# Patient Record
Sex: Male | Born: 1940 | Race: White | Hispanic: No | Marital: Married | Smoking: Never smoker
Health system: Southern US, Community
[De-identification: ages and names within clinical notes are randomized; demographics above are authoritative.]

## PROBLEM LIST (undated history)

## (undated) DIAGNOSIS — E785 Hyperlipidemia, unspecified: Secondary | ICD-10-CM

## (undated) DIAGNOSIS — Z860100 Personal history of colon polyps, unspecified: Secondary | ICD-10-CM

## (undated) DIAGNOSIS — I724 Aneurysm of artery of lower extremity: Secondary | ICD-10-CM

## (undated) DIAGNOSIS — M2540 Effusion, unspecified joint: Secondary | ICD-10-CM

## (undated) DIAGNOSIS — R091 Pleurisy: Secondary | ICD-10-CM

## (undated) DIAGNOSIS — G4700 Insomnia, unspecified: Secondary | ICD-10-CM

## (undated) DIAGNOSIS — G4730 Sleep apnea, unspecified: Secondary | ICD-10-CM

## (undated) DIAGNOSIS — D689 Coagulation defect, unspecified: Secondary | ICD-10-CM

## (undated) DIAGNOSIS — I4891 Unspecified atrial fibrillation: Secondary | ICD-10-CM

## (undated) DIAGNOSIS — I82409 Acute embolism and thrombosis of unspecified deep veins of unspecified lower extremity: Secondary | ICD-10-CM

## (undated) DIAGNOSIS — G709 Myoneural disorder, unspecified: Secondary | ICD-10-CM

## (undated) DIAGNOSIS — G629 Polyneuropathy, unspecified: Secondary | ICD-10-CM

## (undated) DIAGNOSIS — G4733 Obstructive sleep apnea (adult) (pediatric): Secondary | ICD-10-CM

## (undated) DIAGNOSIS — N419 Inflammatory disease of prostate, unspecified: Secondary | ICD-10-CM

## (undated) DIAGNOSIS — F32A Depression, unspecified: Secondary | ICD-10-CM

## (undated) DIAGNOSIS — R3915 Urgency of urination: Secondary | ICD-10-CM

## (undated) DIAGNOSIS — I2510 Atherosclerotic heart disease of native coronary artery without angina pectoris: Secondary | ICD-10-CM

## (undated) DIAGNOSIS — Z86718 Personal history of other venous thrombosis and embolism: Secondary | ICD-10-CM

## (undated) DIAGNOSIS — F329 Major depressive disorder, single episode, unspecified: Secondary | ICD-10-CM

## (undated) DIAGNOSIS — R1310 Dysphagia, unspecified: Secondary | ICD-10-CM

## (undated) DIAGNOSIS — J309 Allergic rhinitis, unspecified: Secondary | ICD-10-CM

## (undated) DIAGNOSIS — Z950 Presence of cardiac pacemaker: Secondary | ICD-10-CM

## (undated) DIAGNOSIS — M2550 Pain in unspecified joint: Secondary | ICD-10-CM

## (undated) DIAGNOSIS — I739 Peripheral vascular disease, unspecified: Secondary | ICD-10-CM

## (undated) DIAGNOSIS — N183 Chronic kidney disease, stage 3 unspecified: Secondary | ICD-10-CM

## (undated) DIAGNOSIS — K5790 Diverticulosis of intestine, part unspecified, without perforation or abscess without bleeding: Secondary | ICD-10-CM

## (undated) DIAGNOSIS — T7840XA Allergy, unspecified, initial encounter: Secondary | ICD-10-CM

## (undated) DIAGNOSIS — M1990 Unspecified osteoarthritis, unspecified site: Secondary | ICD-10-CM

## (undated) HISTORY — DX: Presence of cardiac pacemaker: Z95.0

## (undated) HISTORY — DX: Hyperlipidemia, unspecified: E78.5

## (undated) HISTORY — PX: INGUINAL HERNIA REPAIR: SHX194

## (undated) HISTORY — DX: Myoneural disorder, unspecified: G70.9

## (undated) HISTORY — DX: Atherosclerotic heart disease of native coronary artery without angina pectoris: I25.10

## (undated) HISTORY — DX: Coagulation defect, unspecified: D68.9

## (undated) HISTORY — DX: Obstructive sleep apnea (adult) (pediatric): G47.33

## (undated) HISTORY — DX: Depression, unspecified: F32.A

## (undated) HISTORY — DX: Unspecified osteoarthritis, unspecified site: M19.90

## (undated) HISTORY — DX: Acute embolism and thrombosis of unspecified deep veins of unspecified lower extremity: I82.409

## (undated) HISTORY — DX: Unspecified atrial fibrillation: I48.91

## (undated) HISTORY — DX: Allergic rhinitis, unspecified: J30.9

## (undated) HISTORY — PX: HAND SURGERY: SHX662

## (undated) HISTORY — PX: PACEMAKER INSERTION: SHX728

## (undated) HISTORY — PX: TURBINATE REDUCTION: SHX6157

## (undated) HISTORY — DX: Aneurysm of artery of lower extremity: I72.4

## (undated) HISTORY — DX: Peripheral vascular disease, unspecified: I73.9

## (undated) HISTORY — DX: Sleep apnea, unspecified: G47.30

## (undated) HISTORY — PX: COLONOSCOPY: SHX174

## (undated) HISTORY — DX: Major depressive disorder, single episode, unspecified: F32.9

## (undated) HISTORY — DX: Allergy, unspecified, initial encounter: T78.40XA

## (undated) HISTORY — PX: OTHER SURGICAL HISTORY: SHX169

---

## 1996-09-25 HISTORY — PX: PACEMAKER INSERTION: SHX728

## 1999-09-09 ENCOUNTER — Encounter: Payer: Self-pay | Admitting: Orthopedic Surgery

## 1999-09-09 ENCOUNTER — Encounter: Admission: RE | Admit: 1999-09-09 | Discharge: 1999-09-09 | Payer: Self-pay | Admitting: Orthopedic Surgery

## 1999-09-13 ENCOUNTER — Ambulatory Visit (HOSPITAL_BASED_OUTPATIENT_CLINIC_OR_DEPARTMENT_OTHER): Admission: RE | Admit: 1999-09-13 | Discharge: 1999-09-13 | Payer: Self-pay | Admitting: Orthopedic Surgery

## 1999-09-13 ENCOUNTER — Encounter (INDEPENDENT_AMBULATORY_CARE_PROVIDER_SITE_OTHER): Payer: Self-pay | Admitting: *Deleted

## 2001-09-11 DIAGNOSIS — Z86718 Personal history of other venous thrombosis and embolism: Secondary | ICD-10-CM

## 2001-09-11 HISTORY — DX: Personal history of other venous thrombosis and embolism: Z86.718

## 2001-10-18 ENCOUNTER — Inpatient Hospital Stay (HOSPITAL_COMMUNITY): Admission: EM | Admit: 2001-10-18 | Discharge: 2001-10-29 | Payer: Self-pay | Admitting: *Deleted

## 2001-10-18 ENCOUNTER — Encounter: Payer: Self-pay | Admitting: *Deleted

## 2001-10-18 ENCOUNTER — Encounter (INDEPENDENT_AMBULATORY_CARE_PROVIDER_SITE_OTHER): Payer: Self-pay

## 2001-10-22 ENCOUNTER — Encounter: Payer: Self-pay | Admitting: *Deleted

## 2001-10-23 ENCOUNTER — Encounter: Payer: Self-pay | Admitting: *Deleted

## 2002-02-18 ENCOUNTER — Encounter: Payer: Self-pay | Admitting: *Deleted

## 2002-02-19 ENCOUNTER — Inpatient Hospital Stay (HOSPITAL_COMMUNITY): Admission: RE | Admit: 2002-02-19 | Discharge: 2002-02-22 | Payer: Self-pay | Admitting: *Deleted

## 2002-07-28 ENCOUNTER — Inpatient Hospital Stay (HOSPITAL_COMMUNITY): Admission: EM | Admit: 2002-07-28 | Discharge: 2002-07-29 | Payer: Self-pay | Admitting: Emergency Medicine

## 2002-07-28 ENCOUNTER — Encounter: Payer: Self-pay | Admitting: Internal Medicine

## 2002-09-11 HISTORY — PX: CARDIAC CATHETERIZATION: SHX172

## 2002-11-28 ENCOUNTER — Encounter: Payer: Self-pay | Admitting: Cardiology

## 2002-11-29 ENCOUNTER — Inpatient Hospital Stay (HOSPITAL_COMMUNITY): Admission: AD | Admit: 2002-11-29 | Discharge: 2002-12-01 | Payer: Self-pay | Admitting: Cardiology

## 2003-01-05 ENCOUNTER — Encounter (HOSPITAL_COMMUNITY): Admission: RE | Admit: 2003-01-05 | Discharge: 2003-04-05 | Payer: Self-pay | Admitting: Cardiology

## 2003-11-09 ENCOUNTER — Ambulatory Visit (HOSPITAL_COMMUNITY): Admission: RE | Admit: 2003-11-09 | Discharge: 2003-11-09 | Payer: Self-pay | Admitting: Internal Medicine

## 2004-01-22 ENCOUNTER — Inpatient Hospital Stay (HOSPITAL_COMMUNITY): Admission: AD | Admit: 2004-01-22 | Discharge: 2004-01-25 | Payer: Self-pay | Admitting: Internal Medicine

## 2004-07-13 ENCOUNTER — Ambulatory Visit: Payer: Self-pay | Admitting: *Deleted

## 2004-07-27 ENCOUNTER — Ambulatory Visit: Payer: Self-pay | Admitting: *Deleted

## 2004-07-27 ENCOUNTER — Ambulatory Visit: Payer: Self-pay | Admitting: Internal Medicine

## 2004-08-17 ENCOUNTER — Ambulatory Visit: Payer: Self-pay | Admitting: *Deleted

## 2004-09-08 ENCOUNTER — Ambulatory Visit: Payer: Self-pay | Admitting: Pulmonary Disease

## 2004-09-14 ENCOUNTER — Ambulatory Visit: Payer: Self-pay | Admitting: Cardiology

## 2004-09-18 ENCOUNTER — Ambulatory Visit (HOSPITAL_BASED_OUTPATIENT_CLINIC_OR_DEPARTMENT_OTHER): Admission: RE | Admit: 2004-09-18 | Discharge: 2004-09-18 | Payer: Self-pay | Admitting: Pulmonary Disease

## 2004-09-18 ENCOUNTER — Ambulatory Visit: Payer: Self-pay | Admitting: Pulmonary Disease

## 2004-10-04 ENCOUNTER — Ambulatory Visit: Payer: Self-pay | Admitting: Internal Medicine

## 2004-10-06 ENCOUNTER — Ambulatory Visit: Payer: Self-pay | Admitting: Cardiology

## 2004-10-12 ENCOUNTER — Ambulatory Visit: Payer: Self-pay | Admitting: Cardiology

## 2004-10-24 ENCOUNTER — Ambulatory Visit: Payer: Self-pay | Admitting: Pulmonary Disease

## 2004-10-25 ENCOUNTER — Ambulatory Visit: Payer: Self-pay | Admitting: Internal Medicine

## 2004-10-26 ENCOUNTER — Ambulatory Visit: Payer: Self-pay | Admitting: Internal Medicine

## 2004-11-21 ENCOUNTER — Ambulatory Visit: Payer: Self-pay | Admitting: Pulmonary Disease

## 2004-11-23 ENCOUNTER — Ambulatory Visit: Payer: Self-pay | Admitting: *Deleted

## 2004-11-30 ENCOUNTER — Ambulatory Visit: Payer: Self-pay | Admitting: Gastroenterology

## 2004-12-21 ENCOUNTER — Ambulatory Visit: Payer: Self-pay | Admitting: Internal Medicine

## 2005-01-18 ENCOUNTER — Ambulatory Visit: Payer: Self-pay | Admitting: Cardiology

## 2005-01-27 ENCOUNTER — Ambulatory Visit: Payer: Self-pay | Admitting: Cardiology

## 2005-02-02 ENCOUNTER — Ambulatory Visit: Payer: Self-pay | Admitting: Cardiology

## 2005-02-15 ENCOUNTER — Ambulatory Visit: Payer: Self-pay | Admitting: Cardiology

## 2005-02-21 ENCOUNTER — Ambulatory Visit: Payer: Self-pay | Admitting: Gastroenterology

## 2005-03-07 ENCOUNTER — Encounter: Payer: Self-pay | Admitting: Internal Medicine

## 2005-03-07 ENCOUNTER — Ambulatory Visit: Payer: Self-pay | Admitting: Gastroenterology

## 2005-03-22 ENCOUNTER — Ambulatory Visit: Payer: Self-pay | Admitting: Cardiology

## 2005-04-19 ENCOUNTER — Ambulatory Visit: Payer: Self-pay | Admitting: Cardiology

## 2005-07-11 ENCOUNTER — Ambulatory Visit: Payer: Self-pay | Admitting: Cardiology

## 2005-07-21 ENCOUNTER — Ambulatory Visit: Payer: Self-pay | Admitting: Cardiology

## 2005-07-27 ENCOUNTER — Ambulatory Visit: Payer: Self-pay | Admitting: Internal Medicine

## 2005-08-08 ENCOUNTER — Ambulatory Visit: Payer: Self-pay | Admitting: *Deleted

## 2005-08-28 ENCOUNTER — Ambulatory Visit: Payer: Self-pay | Admitting: Cardiology

## 2005-09-13 ENCOUNTER — Ambulatory Visit: Payer: Self-pay

## 2005-09-13 ENCOUNTER — Encounter: Payer: Self-pay | Admitting: Cardiovascular Disease

## 2005-09-18 ENCOUNTER — Ambulatory Visit: Payer: Self-pay | Admitting: Cardiology

## 2005-09-29 ENCOUNTER — Ambulatory Visit: Payer: Self-pay | Admitting: Internal Medicine

## 2005-10-12 ENCOUNTER — Ambulatory Visit: Payer: Self-pay | Admitting: Cardiology

## 2005-10-12 ENCOUNTER — Ambulatory Visit: Payer: Self-pay | Admitting: Pulmonary Disease

## 2005-10-16 ENCOUNTER — Ambulatory Visit: Payer: Self-pay | Admitting: Internal Medicine

## 2005-10-16 ENCOUNTER — Ambulatory Visit: Payer: Self-pay

## 2005-10-19 ENCOUNTER — Ambulatory Visit: Payer: Self-pay | Admitting: *Deleted

## 2005-10-30 ENCOUNTER — Ambulatory Visit: Payer: Self-pay | Admitting: Cardiology

## 2005-11-20 ENCOUNTER — Ambulatory Visit: Payer: Self-pay | Admitting: Cardiology

## 2005-11-23 ENCOUNTER — Ambulatory Visit: Payer: Self-pay

## 2005-12-12 ENCOUNTER — Ambulatory Visit: Payer: Self-pay | Admitting: Cardiology

## 2005-12-12 ENCOUNTER — Observation Stay (HOSPITAL_COMMUNITY): Admission: AD | Admit: 2005-12-12 | Discharge: 2005-12-13 | Payer: Self-pay | Admitting: Internal Medicine

## 2005-12-21 ENCOUNTER — Ambulatory Visit: Payer: Self-pay | Admitting: Internal Medicine

## 2006-01-16 ENCOUNTER — Ambulatory Visit: Payer: Self-pay | Admitting: Internal Medicine

## 2006-01-18 ENCOUNTER — Ambulatory Visit: Payer: Self-pay | Admitting: Pulmonary Disease

## 2006-02-02 ENCOUNTER — Ambulatory Visit: Payer: Self-pay | Admitting: Internal Medicine

## 2006-03-01 ENCOUNTER — Ambulatory Visit: Payer: Self-pay | Admitting: Cardiology

## 2006-03-26 ENCOUNTER — Ambulatory Visit: Payer: Self-pay | Admitting: Cardiology

## 2006-04-25 ENCOUNTER — Ambulatory Visit: Payer: Self-pay | Admitting: *Deleted

## 2006-04-27 ENCOUNTER — Ambulatory Visit: Payer: Self-pay | Admitting: Cardiology

## 2006-05-10 ENCOUNTER — Ambulatory Visit: Payer: Self-pay | Admitting: Cardiology

## 2006-05-23 ENCOUNTER — Ambulatory Visit: Payer: Self-pay | Admitting: Cardiovascular Disease

## 2006-08-15 ENCOUNTER — Ambulatory Visit: Payer: Self-pay | Admitting: Cardiology

## 2006-08-24 ENCOUNTER — Ambulatory Visit: Payer: Self-pay | Admitting: Internal Medicine

## 2006-08-30 ENCOUNTER — Ambulatory Visit: Payer: Self-pay | Admitting: Cardiology

## 2006-12-27 ENCOUNTER — Ambulatory Visit: Payer: Self-pay | Admitting: *Deleted

## 2007-01-22 ENCOUNTER — Ambulatory Visit: Payer: Self-pay | Admitting: Internal Medicine

## 2007-01-22 LAB — CONVERTED CEMR LAB
ALT: 19 units/L (ref 0–40)
AST: 27 units/L (ref 0–37)
Alkaline Phosphatase: 35 units/L — ABNORMAL LOW (ref 39–117)
Bilirubin, Direct: 0.1 mg/dL (ref 0.0–0.3)
Cholesterol: 118 mg/dL (ref 0–200)
HDL: 34.9 mg/dL — ABNORMAL LOW (ref >39.0)
LDL Cholesterol: 75 mg/dL (ref 0–99)
VLDL: 8 mg/dL (ref 0–40)

## 2007-01-24 ENCOUNTER — Ambulatory Visit: Payer: Self-pay | Admitting: Internal Medicine

## 2007-02-21 ENCOUNTER — Ambulatory Visit: Payer: Self-pay | Admitting: Cardiology

## 2007-03-05 ENCOUNTER — Ambulatory Visit: Payer: Self-pay | Admitting: Internal Medicine

## 2007-03-05 LAB — CONVERTED CEMR LAB
ALT: 22 units/L (ref 0–40)
Albumin: 4 g/dL (ref 3.5–5.2)
Alkaline Phosphatase: 31 units/L — ABNORMAL LOW (ref 39–117)
BUN: 13 mg/dL (ref 6–23)
Basophils Absolute: 0.1 10*3/uL (ref 0.0–0.1)
Basophils Relative: 1.5 % — ABNORMAL HIGH (ref 0.0–1.0)
CO2: 31 meq/L (ref 19–32)
Calcium: 9.7 mg/dL (ref 8.4–10.5)
Eosinophils Absolute: 0.2 10*3/uL (ref 0.0–0.6)
GFR calc Af Amer: 65 mL/min
GFR calc non Af Amer: 54 mL/min
Hemoglobin: 14.1 g/dL (ref 13.0–17.0)
Lymphocytes Relative: 33.9 % (ref 12.0–46.0)
MCHC: 34.3 g/dL (ref 30.0–36.0)
MCV: 94.2 fL (ref 78.0–100.0)
Monocytes Absolute: 0.6 10*3/uL (ref 0.2–0.7)
Monocytes Relative: 10.9 % (ref 3.0–11.0)
Neutro Abs: 2.9 10*3/uL (ref 1.4–7.7)
Platelets: 187 10*3/uL (ref 150–400)
Potassium: 4.3 meq/L (ref 3.5–5.1)
TSH: 1.91 microintl units/mL (ref 0.35–5.50)
Testosterone: 536.65 ng/dL (ref 350.00–890)
Total Protein: 6.8 g/dL (ref 6.0–8.3)

## 2007-03-21 ENCOUNTER — Ambulatory Visit: Payer: Self-pay | Admitting: Cardiovascular Disease

## 2007-04-18 ENCOUNTER — Ambulatory Visit: Payer: Self-pay | Admitting: Cardiovascular Disease

## 2007-05-01 ENCOUNTER — Ambulatory Visit: Payer: Self-pay | Admitting: Internal Medicine

## 2007-05-01 LAB — CONVERTED CEMR LAB
AST: 24 units/L (ref 0–37)
Albumin: 3.6 g/dL (ref 3.5–5.2)
Cholesterol: 105 mg/dL (ref 0–200)
Total Bilirubin: 0.8 mg/dL (ref 0.3–1.2)
Total CHOL/HDL Ratio: 3.1
Total Protein: 6.4 g/dL (ref 6.0–8.3)
Triglycerides: 40 mg/dL (ref 0–149)

## 2007-05-02 ENCOUNTER — Ambulatory Visit: Payer: Self-pay | Admitting: Cardiology

## 2007-05-09 ENCOUNTER — Ambulatory Visit: Payer: Self-pay | Admitting: Internal Medicine

## 2007-05-16 ENCOUNTER — Ambulatory Visit: Payer: Self-pay | Admitting: Cardiology

## 2007-05-31 ENCOUNTER — Ambulatory Visit: Payer: Self-pay | Admitting: Cardiology

## 2007-06-13 ENCOUNTER — Ambulatory Visit: Payer: Self-pay | Admitting: Cardiology

## 2007-06-20 ENCOUNTER — Ambulatory Visit (HOSPITAL_COMMUNITY): Admission: RE | Admit: 2007-06-20 | Discharge: 2007-06-20 | Payer: Self-pay | Admitting: General Surgery

## 2007-07-05 ENCOUNTER — Ambulatory Visit: Payer: Self-pay | Admitting: Cardiovascular Disease

## 2007-07-12 ENCOUNTER — Encounter: Payer: Self-pay | Admitting: Internal Medicine

## 2007-08-02 ENCOUNTER — Ambulatory Visit: Payer: Self-pay | Admitting: Internal Medicine

## 2007-08-16 ENCOUNTER — Ambulatory Visit: Payer: Self-pay | Admitting: *Deleted

## 2007-08-30 ENCOUNTER — Ambulatory Visit: Payer: Self-pay | Admitting: Cardiology

## 2007-09-27 ENCOUNTER — Ambulatory Visit: Payer: Self-pay | Admitting: Cardiology

## 2007-10-16 ENCOUNTER — Ambulatory Visit: Payer: Self-pay | Admitting: Cardiology

## 2007-10-16 LAB — CONVERTED CEMR LAB
ALT: 21 units/L (ref 0–53)
AST: 27 units/L (ref 0–37)
Alkaline Phosphatase: 36 units/L — ABNORMAL LOW (ref 39–117)
Cholesterol: 110 mg/dL (ref 0–200)
LDL Cholesterol: 64 mg/dL (ref 0–99)
Total Protein: 6.8 g/dL (ref 6.0–8.3)
VLDL: 6 mg/dL (ref 0–40)

## 2007-10-17 ENCOUNTER — Ambulatory Visit: Payer: Self-pay

## 2007-10-29 ENCOUNTER — Ambulatory Visit: Payer: Self-pay | Admitting: Cardiology

## 2007-11-26 ENCOUNTER — Ambulatory Visit: Payer: Self-pay | Admitting: Cardiology

## 2007-12-16 ENCOUNTER — Ambulatory Visit: Payer: Self-pay

## 2007-12-24 ENCOUNTER — Ambulatory Visit: Payer: Self-pay | Admitting: Cardiology

## 2008-01-07 ENCOUNTER — Ambulatory Visit: Payer: Self-pay | Admitting: Internal Medicine

## 2008-01-07 DIAGNOSIS — K5730 Diverticulosis of large intestine without perforation or abscess without bleeding: Secondary | ICD-10-CM | POA: Insufficient documentation

## 2008-01-07 DIAGNOSIS — G4733 Obstructive sleep apnea (adult) (pediatric): Secondary | ICD-10-CM | POA: Insufficient documentation

## 2008-01-07 DIAGNOSIS — I739 Peripheral vascular disease, unspecified: Secondary | ICD-10-CM | POA: Insufficient documentation

## 2008-01-07 DIAGNOSIS — I2510 Atherosclerotic heart disease of native coronary artery without angina pectoris: Secondary | ICD-10-CM | POA: Insufficient documentation

## 2008-01-07 DIAGNOSIS — F3289 Other specified depressive episodes: Secondary | ICD-10-CM | POA: Insufficient documentation

## 2008-01-07 DIAGNOSIS — F329 Major depressive disorder, single episode, unspecified: Secondary | ICD-10-CM | POA: Insufficient documentation

## 2008-01-07 DIAGNOSIS — E785 Hyperlipidemia, unspecified: Secondary | ICD-10-CM | POA: Insufficient documentation

## 2008-01-21 ENCOUNTER — Telehealth: Payer: Self-pay | Admitting: Internal Medicine

## 2008-01-23 ENCOUNTER — Ambulatory Visit: Payer: Self-pay | Admitting: Internal Medicine

## 2008-02-21 ENCOUNTER — Ambulatory Visit: Payer: Self-pay | Admitting: Cardiology

## 2008-03-31 ENCOUNTER — Ambulatory Visit: Payer: Self-pay | Admitting: Cardiology

## 2008-03-31 LAB — CONVERTED CEMR LAB
Bilirubin, Direct: 0.1 mg/dL (ref 0.0–0.3)
HDL: 38.5 mg/dL — ABNORMAL LOW (ref >39.0)
Total Bilirubin: 0.9 mg/dL (ref 0.3–1.2)
Total CHOL/HDL Ratio: 2.8
VLDL: 7 mg/dL (ref 0–40)

## 2008-04-03 ENCOUNTER — Ambulatory Visit: Payer: Self-pay | Admitting: Pulmonary Disease

## 2008-04-06 ENCOUNTER — Ambulatory Visit: Payer: Self-pay | Admitting: Cardiovascular Disease

## 2008-04-30 ENCOUNTER — Encounter: Payer: Self-pay | Admitting: Pulmonary Disease

## 2008-07-02 ENCOUNTER — Ambulatory Visit: Payer: Self-pay | Admitting: Internal Medicine

## 2008-07-02 LAB — CONVERTED CEMR LAB
CO2: 30 meq/L (ref 19–32)
Chloride: 105 meq/L (ref 96–112)
Glucose, Bld: 89 mg/dL (ref 70–99)
Potassium: 4.1 meq/L (ref 3.5–5.1)
Sodium: 141 meq/L (ref 135–145)

## 2008-08-19 ENCOUNTER — Ambulatory Visit: Payer: Self-pay | Admitting: *Deleted

## 2008-09-30 ENCOUNTER — Ambulatory Visit: Payer: Self-pay | Admitting: Internal Medicine

## 2008-10-01 ENCOUNTER — Ambulatory Visit: Payer: Self-pay | Admitting: Cardiology

## 2008-10-01 LAB — CONVERTED CEMR LAB
AST: 25 units/L (ref 0–37)
Albumin: 3.8 g/dL (ref 3.5–5.2)
HDL: 35.7 mg/dL — ABNORMAL LOW (ref >39.0)
LDL Cholesterol: 66 mg/dL (ref 0–99)
Total CHOL/HDL Ratio: 3.1
Triglycerides: 43 mg/dL (ref 0–149)
VLDL: 9 mg/dL (ref 0–40)

## 2008-10-05 ENCOUNTER — Ambulatory Visit: Payer: Self-pay | Admitting: Cardiology

## 2008-11-03 ENCOUNTER — Telehealth (INDEPENDENT_AMBULATORY_CARE_PROVIDER_SITE_OTHER): Payer: Self-pay | Admitting: *Deleted

## 2008-11-10 ENCOUNTER — Encounter: Payer: Self-pay | Admitting: Internal Medicine

## 2008-12-01 ENCOUNTER — Ambulatory Visit: Payer: Self-pay | Admitting: Internal Medicine

## 2009-01-27 ENCOUNTER — Ambulatory Visit: Payer: Self-pay | Admitting: Internal Medicine

## 2009-01-27 LAB — CONVERTED CEMR LAB
ALT: 20 U/L
AST: 23 U/L
Albumin: 3.9 g/dL
Alkaline Phosphatase: 39 U/L
BUN: 19 mg/dL
Basophils Absolute: 0.1 10*3/uL
Basophils Relative: 2.2 %
Bilirubin Urine: NEGATIVE
Bilirubin, Direct: 0.2 mg/dL
CO2: 32 meq/L
Calcium: 10.2 mg/dL
Chloride: 109 meq/L
Cholesterol: 115 mg/dL
Creatinine, Ser: 1.3 mg/dL
Eosinophils Absolute: 0.3 10*3/uL
Eosinophils Relative: 5.5 % — ABNORMAL HIGH
GFR calc non Af Amer: 58.11 mL/min
Glucose, Bld: 99 mg/dL
HCT: 41.5 %
HDL: 41.5 mg/dL
Hemoglobin, Urine: NEGATIVE
Hemoglobin: 14.2 g/dL
Ketones, ur: NEGATIVE mg/dL
LDL Cholesterol: 67 mg/dL
Leukocytes, UA: NEGATIVE
Lymphocytes Relative: 28.9 %
Lymphs Abs: 1.4 10*3/uL
MCHC: 34.2 g/dL
MCV: 97 fL
Monocytes Absolute: 0.6 10*3/uL
Monocytes Relative: 11.7 %
Neutro Abs: 2.4 10*3/uL
Neutrophils Relative %: 51.7 %
Nitrite: NEGATIVE
PSA: 0.45 ng/mL
Platelets: 162 10*3/uL
Potassium: 5 meq/L
RBC: 4.28 M/uL
RDW: 13.1 %
Sodium: 145 meq/L
Specific Gravity, Urine: 1.025
TSH: 2.72 u[IU]/mL
Total Bilirubin: 1.1 mg/dL
Total CHOL/HDL Ratio: 3
Total Protein, Urine: NEGATIVE mg/dL
Total Protein: 6.9 g/dL
Triglycerides: 35 mg/dL
Urine Glucose: NEGATIVE mg/dL
Urobilinogen, UA: 0.2
VLDL: 7 mg/dL
WBC: 4.8 10*3/uL
pH: 6

## 2009-02-02 ENCOUNTER — Ambulatory Visit: Payer: Self-pay | Admitting: Internal Medicine

## 2009-02-02 DIAGNOSIS — G5620 Lesion of ulnar nerve, unspecified upper limb: Secondary | ICD-10-CM

## 2009-02-02 DIAGNOSIS — M7710 Lateral epicondylitis, unspecified elbow: Secondary | ICD-10-CM | POA: Insufficient documentation

## 2009-02-03 ENCOUNTER — Encounter (INDEPENDENT_AMBULATORY_CARE_PROVIDER_SITE_OTHER): Payer: Self-pay | Admitting: *Deleted

## 2009-03-18 ENCOUNTER — Telehealth (INDEPENDENT_AMBULATORY_CARE_PROVIDER_SITE_OTHER): Payer: Self-pay | Admitting: *Deleted

## 2009-03-22 ENCOUNTER — Ambulatory Visit: Payer: Self-pay | Admitting: Cardiology

## 2009-03-23 LAB — CONVERTED CEMR LAB
AST: 27 units/L (ref 0–37)
Albumin: 3.9 g/dL (ref 3.5–5.2)
Alkaline Phosphatase: 38 units/L — ABNORMAL LOW (ref 39–117)
Cholesterol: 101 mg/dL (ref 0–200)
Total Protein: 6.9 g/dL (ref 6.0–8.3)
Triglycerides: 34 mg/dL (ref 0.0–149.0)

## 2009-04-02 ENCOUNTER — Ambulatory Visit: Payer: Self-pay | Admitting: Pulmonary Disease

## 2009-04-05 ENCOUNTER — Ambulatory Visit: Payer: Self-pay | Admitting: Cardiology

## 2009-04-05 ENCOUNTER — Telehealth: Payer: Self-pay | Admitting: Pulmonary Disease

## 2009-05-07 ENCOUNTER — Telehealth: Payer: Self-pay | Admitting: Pulmonary Disease

## 2009-05-11 ENCOUNTER — Ambulatory Visit: Payer: Self-pay | Admitting: Internal Medicine

## 2009-05-11 DIAGNOSIS — I4891 Unspecified atrial fibrillation: Secondary | ICD-10-CM | POA: Insufficient documentation

## 2009-05-13 ENCOUNTER — Telehealth (INDEPENDENT_AMBULATORY_CARE_PROVIDER_SITE_OTHER): Payer: Self-pay | Admitting: *Deleted

## 2009-05-18 ENCOUNTER — Telehealth (INDEPENDENT_AMBULATORY_CARE_PROVIDER_SITE_OTHER): Payer: Self-pay | Admitting: *Deleted

## 2009-06-01 ENCOUNTER — Ambulatory Visit: Payer: Self-pay | Admitting: Internal Medicine

## 2009-06-03 LAB — CONVERTED CEMR LAB
Calcium: 9.3 mg/dL (ref 8.4–10.5)
Creatinine, Ser: 1.3 mg/dL (ref 0.4–1.5)
GFR calc non Af Amer: 58.06 mL/min (ref >60)
Glucose, Bld: 98 mg/dL (ref 70–99)
Sodium: 143 meq/L (ref 135–145)

## 2009-06-04 ENCOUNTER — Telehealth (INDEPENDENT_AMBULATORY_CARE_PROVIDER_SITE_OTHER): Payer: Self-pay | Admitting: *Deleted

## 2009-06-04 ENCOUNTER — Telehealth: Payer: Self-pay | Admitting: Internal Medicine

## 2009-07-02 ENCOUNTER — Telehealth: Payer: Self-pay | Admitting: Internal Medicine

## 2009-07-17 ENCOUNTER — Encounter: Payer: Self-pay | Admitting: Pulmonary Disease

## 2009-08-04 ENCOUNTER — Telehealth: Payer: Self-pay | Admitting: Internal Medicine

## 2009-08-16 ENCOUNTER — Telehealth: Payer: Self-pay | Admitting: Internal Medicine

## 2009-08-23 ENCOUNTER — Ambulatory Visit: Payer: Self-pay | Admitting: Surgery

## 2009-09-30 ENCOUNTER — Encounter: Payer: Self-pay | Admitting: Pulmonary Disease

## 2009-10-05 ENCOUNTER — Telehealth (INDEPENDENT_AMBULATORY_CARE_PROVIDER_SITE_OTHER): Payer: Self-pay | Admitting: *Deleted

## 2009-10-06 ENCOUNTER — Ambulatory Visit: Payer: Self-pay | Admitting: Cardiology

## 2009-10-08 LAB — CONVERTED CEMR LAB
ALT: 20 units/L (ref 0–53)
AST: 24 units/L (ref 0–37)
Alkaline Phosphatase: 36 units/L — ABNORMAL LOW (ref 39–117)
HDL: 46.4 mg/dL (ref >39.00)
Total Bilirubin: 1 mg/dL (ref 0.3–1.2)

## 2009-10-11 ENCOUNTER — Encounter (INDEPENDENT_AMBULATORY_CARE_PROVIDER_SITE_OTHER): Payer: Self-pay | Admitting: *Deleted

## 2009-10-14 ENCOUNTER — Ambulatory Visit: Payer: Self-pay | Admitting: Cardiology

## 2009-11-09 ENCOUNTER — Telehealth: Payer: Self-pay | Admitting: Internal Medicine

## 2009-12-02 ENCOUNTER — Telehealth: Payer: Self-pay | Admitting: Internal Medicine

## 2009-12-16 ENCOUNTER — Telehealth: Payer: Self-pay | Admitting: Internal Medicine

## 2010-02-04 ENCOUNTER — Telehealth: Payer: Self-pay | Admitting: Cardiology

## 2010-02-25 ENCOUNTER — Encounter (INDEPENDENT_AMBULATORY_CARE_PROVIDER_SITE_OTHER): Payer: Self-pay | Admitting: *Deleted

## 2010-03-30 ENCOUNTER — Ambulatory Visit: Payer: Self-pay | Admitting: Cardiology

## 2010-03-30 LAB — CONVERTED CEMR LAB
Alkaline Phosphatase: 38 units/L — ABNORMAL LOW (ref 39–117)
Bilirubin, Direct: 0.2 mg/dL (ref 0.0–0.3)
Cholesterol: 110 mg/dL (ref 0–200)
LDL Cholesterol: 66 mg/dL (ref 0–99)
Total Bilirubin: 0.6 mg/dL (ref 0.3–1.2)
Total CHOL/HDL Ratio: 3
Total Protein: 6.4 g/dL (ref 6.0–8.3)

## 2010-03-31 ENCOUNTER — Ambulatory Visit: Payer: Self-pay | Admitting: Cardiology

## 2010-04-12 ENCOUNTER — Ambulatory Visit: Payer: Self-pay | Admitting: Pulmonary Disease

## 2010-05-10 ENCOUNTER — Ambulatory Visit: Payer: Self-pay | Admitting: Internal Medicine

## 2010-05-31 ENCOUNTER — Ambulatory Visit: Payer: Self-pay | Admitting: Internal Medicine

## 2010-05-31 DIAGNOSIS — M5412 Radiculopathy, cervical region: Secondary | ICD-10-CM | POA: Insufficient documentation

## 2010-05-31 DIAGNOSIS — R220 Localized swelling, mass and lump, head: Secondary | ICD-10-CM | POA: Insufficient documentation

## 2010-05-31 DIAGNOSIS — R221 Localized swelling, mass and lump, neck: Secondary | ICD-10-CM

## 2010-06-06 ENCOUNTER — Telehealth: Payer: Self-pay | Admitting: Internal Medicine

## 2010-06-07 LAB — CONVERTED CEMR LAB
BUN: 18 mg/dL (ref 6–23)
CO2: 29 meq/L (ref 19–32)
Chloride: 107 meq/L (ref 96–112)
Creatinine, Ser: 1.2 mg/dL (ref 0.4–1.5)
Glucose, Bld: 82 mg/dL (ref 70–99)
Potassium: 4.5 meq/L (ref 3.5–5.1)

## 2010-06-08 ENCOUNTER — Encounter: Payer: Self-pay | Admitting: Internal Medicine

## 2010-06-10 ENCOUNTER — Encounter (INDEPENDENT_AMBULATORY_CARE_PROVIDER_SITE_OTHER): Payer: Self-pay | Admitting: *Deleted

## 2010-06-17 ENCOUNTER — Telehealth: Payer: Self-pay | Admitting: Internal Medicine

## 2010-06-23 ENCOUNTER — Encounter (INDEPENDENT_AMBULATORY_CARE_PROVIDER_SITE_OTHER): Payer: Self-pay

## 2010-06-27 ENCOUNTER — Ambulatory Visit: Payer: Self-pay | Admitting: Gastroenterology

## 2010-06-28 ENCOUNTER — Encounter: Admission: RE | Admit: 2010-06-28 | Discharge: 2010-06-28 | Payer: Self-pay | Admitting: Neurosurgery

## 2010-07-05 ENCOUNTER — Encounter (INDEPENDENT_AMBULATORY_CARE_PROVIDER_SITE_OTHER): Payer: Self-pay | Admitting: *Deleted

## 2010-07-22 ENCOUNTER — Ambulatory Visit: Payer: Self-pay | Admitting: Gastroenterology

## 2010-08-18 ENCOUNTER — Ambulatory Visit: Payer: Self-pay | Admitting: Internal Medicine

## 2010-08-25 ENCOUNTER — Ambulatory Visit: Payer: Self-pay | Admitting: Internal Medicine

## 2010-10-02 ENCOUNTER — Encounter: Payer: Self-pay | Admitting: Internal Medicine

## 2010-10-04 ENCOUNTER — Ambulatory Visit
Admission: RE | Admit: 2010-10-04 | Discharge: 2010-10-04 | Payer: Self-pay | Source: Home / Self Care | Attending: Vascular Surgery | Admitting: Vascular Surgery

## 2010-10-04 ENCOUNTER — Ambulatory Visit: Admit: 2010-10-04 | Payer: Self-pay | Admitting: Vascular Surgery

## 2010-10-05 NOTE — Consult Note (Signed)
NEW PATIENT CONSULTATION  [name], [name] DOB:  1941-05-29                                       10/04/2010 ZOXWR#:[id number]  [name] is a 70 year old patient who has had previous vascular surgery by Dr. Madilyn Fireman many years ago.  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  These grafts have functioned nicely over the years. Has no claudication symptoms and no ischemic symptoms in the lower extremities.  He does have decreased sensation in both feet which he has had for many years.  Chronic medical problems: 1. Coronary artery disease with previous PTCA and stenting in the     past. 2. Hyperlipidemia. 3. History of sick sinus syndrome status post pacemaker for atrial     fibrillation, not on Coumadin.  SOCIAL HISTORY:  The patient is married.  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  He does not use tobacco or alcohol.  FAMILY HISTORY:  Positive strongly for coronary artery disease in his mother and father, aortic aneurysm in his father, popliteal aneurysms in his father and 2 strokes in his father, diabetes mellitus in a brother.  REVIEW OF SYSTEMS:  Positive for nosebleeds more in the past when he was on Coumadin and now is on aspirin only.  He does have joint pain. Denies any claudication symptoms.  Has a history of arrhythmias, atrial fibrillation.  All other systems in complete review of systems are negative.  PHYSICAL EXAMINATION:  Blood pressure 110/72 heart rate 60, respirations 20. GENERAL:  He is a well-developed, well-nourished male who is no apparent distress, alert and oriented x3. HEENT:  Exam is normal for age.  EOMs intact. LUNGS:  Clear to auscultation.  No rhonchi or wheezing. CARDIOVASCULAR:  Irregular rhythm with no murmurs.  Carotid  pulses are 3+.  No bruits are audible. ABDOMEN:  Soft and nontender.  No pulsatile mass noted. MUSCULOSKELETAL:  Exam is free of major deformities. NEUROLOGIC:  Exam is normal except slight decreased sensation in the feet. SKIN:  Free of rashes. Lower extremity exam reveals 3+ femoral, popliteal, and posterior tibial pulses palpable bilaterally.  Today I ordered lower extremity arterial Dopplers which revealed ABIs greater than 1.0 with triphasic flow in both feet.  I think his bypass grafts are functioning nicely.  He has not had a duplex scan of his aorta in a few years.  I think when he will return in 1 year, we will do a duplex scan of his aorta to rule out aneurysm in this patient with a history of bilateral popliteal aneurysms and a family history of aneurysm.  Will also have duplex scan of both bypass grafts and ABIs unless he develops any symptoms in the interim.    Quita Skye Hart [place], M.D. Electronically Signed  JDL/MEDQ  D:  10/04/2010  T:  10/05/2010  Job:  [id number]

## 2010-10-09 LAB — CONVERTED CEMR LAB
CO2: 30 meq/L (ref 19–32)
Calcium: 9.4 mg/dL (ref 8.4–10.5)
Creatinine, Ser: 1.2 mg/dL (ref 0.4–1.5)
Magnesium: 2.3 mg/dL (ref 1.5–2.5)

## 2010-10-11 NOTE — Assessment & Plan Note (Signed)
Summary: rov for osa   CC:  Yearly OSA Follow up.  Pt states he is wearing cpap everynight for approx 7.5 hours each night.  States mask is a "poor fit."  Denies problems with pressure.  Marland Kitchen  History of Present Illness: The pt comes in today for f/u of his known osa.  He is wearing cpap compliantly, and denies any issues with pressure or his device.  He has always had a mask fitting issue, but feels his current mask is the best he has tried.  He has been keeping up with the data off the machine, and tells me his AHI is always normal.  He feels that he rests well, and denies any daytime sleepiness issues.  Current Medications (verified): 1)  Cialis 20 Mg  Tabs (Tadalafil) .Marland Kitchen.. 1 By Mouth Once Daily Prn 2)  Lipitor 80 Mg Tabs (Atorvastatin Calcium) .... Take 1/2  Tablet Once Daily 3)  Tricor 145 Mg  Tabs (Fenofibrate) .... Once Daily 4)  Bayer Aspirin 325 Mg  Tabs (Aspirin) .... Take 1 Tablet By Mouth Once A Day 5)  Tikosyn 500 Mcg  Caps (Dofetilide) .... Take 1 Tablet By Mouth Two Times A Day 6)  Cardizem 120 Mg  Tabs (Diltiazem Hcl) .Marland Kitchen.. 1 Once Daily 7)  Potassium Chloride Crys Cr 10 Meq Tbcr (Potassium Chloride Crys Cr) .... Take 1 Tablet By Mouth Twice Daily 8)  Nasonex 50 Mcg/act Susp (Mometasone Furoate) .... 2 Puffs Per Nostril Once Daily 9)  Zolpidem Tartrate 10 Mg Tabs (Zolpidem Tartrate) .Marland Kitchen.. 1 By Mouth At Bedtime As Needed  Allergies (verified): 1)  ! Niacin 2)  ! * Amiodarone 3)  ! * Flecainide 4)  ! * Sotalol 5)  ! * Rhythmol 6)  Ace Inhibitors  Review of Systems       The patient complains of irregular heartbeats, nasal congestion/difficulty breathing through nose, sneezing, and joint stiffness or pain.  The patient denies shortness of breath with activity, shortness of breath at rest, productive cough, non-productive cough, coughing up blood, chest pain, acid heartburn, indigestion, loss of appetite, weight change, abdominal pain, difficulty swallowing, sore throat,  tooth/dental problems, headaches, itching, ear ache, anxiety, depression, hand/feet swelling, rash, change in color of mucus, and fever.    Vital Signs:  Patient profile:   70 year old male Height:      75 inches Weight:      212.38 pounds BMI:     26.64 O2 Sat:      98 % on Room air Temp:     98.1 degrees F oral Pulse rate:   61 / minute BP sitting:   106 / 70  (left arm) Cuff size:   regular  Vitals Entered By: Gweneth Dimitri RN (April 12, 2010 10:12 AM)  O2 Flow:  Room air CC: Yearly OSA Follow up.  Pt states he is wearing cpap everynight for approx 7.5 hours each night.  States mask is a "poor fit."  Denies problems with pressure.   Comments Medications reviewed with patient Daytime contact number verified with patient. Crystal Jones RN  April 12, 2010 10:12 AM    Physical Exam  General:  thin male in nad Nose:  no skin breakdown or pressure necrosis from cpap mask Extremities:  no edema or cyanosis  Neurologic:  alert and oriented, moves all 4.   Impression & Recommendations:  Problem # 1:  OBSTRUCTIVE SLEEP APNEA (ICD-327.23) the pt is doing well with cpap, and is satisfied with  his sleep and daytime alertness.  Although is not a perfect fit, he feels his mask at least does the job.  I have shown him a new full face mask from [facility], and it may be a better fit for him.  He will discuss with dme.  He will f/u with me in one year.  Other Orders: Est. Patient Level III (16109)  Patient Instructions: 1)  continue with cpap  2)  consider trying quattro fx full face. 3)  followup with me in one year.   Immunization History:  Influenza Immunization History:    Influenza:  historical (08/20/2008)

## 2010-10-11 NOTE — Letter (Signed)
Summary: [facility] for [facility]AP Supplies/[facility]  [facility] for [facility]AP Supplies/[facility]   Imported By: Sherian Rein 10/07/2009 08:29:49  _____________________________________________________________________  External Attachment:    Type:   Image     Comment:   External Document

## 2010-10-11 NOTE — Assessment & Plan Note (Signed)
Summary: rov lipid - [facility]   CC:  dyslipidemia follow-up.  History of Present Illness:  Lipid Clinic Visit      The patient presents today for dyslipidemia follow-up.  He is currently taking Lipitor 40mg  and Tricor 145mg  daily and denies any medication problems.  Compliance with medication is good.  Dietary compliance review reveals that patient has been compliant with a heart-healthy diet.  For breakfast, he typically has oatmeal with skim milk, raisins, and nuts.  For lunch, he has a salad or grilled chicken sandwich.  For dinner, his wife typically cooks a meat with two vegetables.  He drinks diet sodas, water, and 3-4 cups of coffee per day.  He denies any snacking.  He uses canola oil or olive oil to cook with.  Review of exercise habits reveals that the patient is not exercising.  He had a pinched nerve in his back which limited his ability and desire to exercise.  The pinched nerve has almost resolved and he plans to resume his old exercise regimen.  He has a Photographer and previously exercised 3-5 times per week there.      Lipid Clinic Visit      The patient presents today for dyslipidemia follow-up.  He is currently taking Lipitor 40mg  and Tricor 145mg  daily and denies any medication problems.  Compliance with medication is good.  Dietary compliance review reveals that patient has been compliant with a heart-healthy diet.  For breakfast, he typically has oatmeal with skim milk, raisins, and nuts.  For lunch, he has a salad or grilled chicken sandwich.  For dinner, his wife typically cooks a meat with two vegetables.  He drinks diet sodas, water, and 3-4 cups of coffee per day.  He denies any snacking.  He uses canola oil or olive oil to cook with.  Review of exercise habits reveals that the patient is not exercising.  He had a pinched nerve in his back which limited his ability and desire to exercise.  The pinched nerve has almost resolved and he plans to resume his old exercise regimen.  He  has a Photographer and previously exercised 3-5 times per week there.    Lipid Management Provider  Weston Brass, PharmD and Dillard Cannon, PharmD candidate  Current Medications (verified): 1)  Cialis 20 Mg  Tabs (Tadalafil) .Marland Kitchen.. 1 By Mouth Once Daily Prn 2)  Lipitor 80 Mg Tabs (Atorvastatin Calcium) .... Take 1/2  Tablet Once Daily 3)  Tricor 145 Mg  Tabs (Fenofibrate) .... Once Daily 4)  Bayer Aspirin 325 Mg  Tabs (Aspirin) .... Take 1 Tablet By Mouth Once A Day 5)  Tikosyn 500 Mcg  Caps (Dofetilide) .... Take 1 Tablet By Mouth Two Times A Day 6)  Cardizem 120 Mg  Tabs (Diltiazem Hcl) .Marland Kitchen.. 1 Once Daily 7)  Potassium Chloride Crys Cr 10 Meq Tbcr (Potassium Chloride Crys Cr) .... Take 1 Tablet By Mouth Twice Daily 8)  Nasonex 50 Mcg/act Susp (Mometasone Furoate) .... 2 Puffs Per Nostril Once Daily 9)  Zolpidem Tartrate 10 Mg Tabs (Zolpidem Tartrate) .Marland Kitchen.. 1 By Mouth At Bedtime As Needed  Allergies (verified): 1)  ! Niacin 2)  ! * Amiodarone 3)  ! * Flecainide 4)  ! * Sotalol 5)  ! * Rhythmol 6)  Ace Inhibitors   Vital Signs:  Patient profile:   70 year old male Height:      75 inches Weight:      204 pounds BMI:  25.59 BP sitting:   118 / 82  (right arm)  Impression & Recommendations:  Problem # 1:  HYPERLIPIDEMIA (ICD-272.4) Patient's lipid values are as follows: TC 110 (at goal<200) TG 49 (at goal <150) HDL 33.9 (below goal>40) LDL 68 (at goal <70) AST and ALT are WNL All of patient's lipid values are at goal except for his HDL.  We discussed AIM-HIGH trial and the lack of benefit in raising HDL in patients whose other lipid values are at goal.  He is very conscientious about his diet and is motivated to continue to make healthy choices.  We encouraged him to continue his healthy diet.  He is motivated to resume exercising once his pinched nerve heals, which will improve his HDL level.  We discussed a goal of exercising 3-4 times per week for 30 minutes at the gym.   Because of the patient's stable lipid panel, we will ask Dr. Graciela Husbands to resume management of pt's hyperlipidemia.  His updated medication list for this problem includes:    Lipitor 80 Mg Tabs (Atorvastatin calcium) .Marland Kitchen... Take 1/2  tablet once daily    Tricor 145 Mg Tabs (Fenofibrate) ..... Once daily  Patient Instructions: 1)  Continue Lipitor 40mg  and Tricor 145mg  daily. 2)  Continue heart-healthy, low-cholesterol, low-fat diet. 3)  Exercise: goal of per day 3-4 times a week at gym  4)  Follow up with Dr. Graciela Husbands for lipid values in future.

## 2010-10-11 NOTE — Assessment & Plan Note (Signed)
Summary: OV--PINCH NERVE/NECK-THROAT LUMP-NOSE BLEED OFF AND ON X 1 YR...   Vital Signs:  Patient profile:   70 year old male Height:      75.5 inches Weight:      211.38 pounds BMI:     26.17 O2 Sat:      97 % on Room air Temp:     98.5 degrees F oral Pulse rate:   60 / minute BP sitting:   100 / 60  (left arm) Cuff size:   regular  Vitals Entered By: Zella Ball Ewing CMA Duncan Dull) (May 31, 2010 9:23 AM)  O2 Flow:  Room air CC: Pinched nerve in neck, nose bleeds, lump on right side of neck/RE   CC:  Pinched nerve in neck, nose bleeds, and lump on right side of neck/RE.  History of Present Illness: here for acute  - c/o90 days onset neck pain, some relief with chiropracter with several treatments;  seemed to start after [facility]; remembers he did an [place] excercise that day with shoudler shrugslifting wts,  and as he was leaving had burning pain to mid lower neck spine area;  has had stretch and manipulation wiht the chiropracter but seems to persist at least mild to mod pain, and currently approx 75% grip strength only;  has noticed some muscluar atrophy to the muscle in between the thumb and first finger left hand, as well some general atrophy he thinks of the LUE prox and distal, as well as occasional cramps to muscles of the LUE he did not use to have.  Has also noted some numbness to the 4th and 5th fingers left hand.  Right handed. No fever,  no change in the bowel or bladder, no gait change, falls  or injury.    also has appt with Dr Isaias Cowman alter this month, but since he is here;  has had left bleeding nosebleed for a yr despite bacitracin, and is s/p left cautery approx 18 mo ago, and known septal ulceration/hole; biopsy neg for cancer and other per pt  also with lump to the mid right jaw line that he has googled adn checked it by palpation 150 times over the past wk and thinks it might a stopped up saliva gland but wants a check for that  ;  No wt loss, night sweats, loss of appetite or other constitutional symptoms  Has some clogged left nostril but no sinus pain, pressure or d/c   Preventive Screening-Counseling & Management      Drug Use:  no.    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 1 By Mouth Once Daily Prn 2)  Lipitor 80 Mg Tabs (Atorvastatin Calcium) .... Take 1/2  Tablet Once Daily 3)  Tricor 145 Mg  Tabs (Fenofibrate) .... Once Daily 4)  Bayer Aspirin 325 Mg  Tabs (Aspirin) .... Take 1 Tablet By Mouth Once A Day  5)  Tikosyn 500 Mcg  Caps (Dofetilide) .... Take 1 Tablet By Mouth Two Times A Day 6)  Cardizem 120 Mg  Tabs (Diltiazem Hcl) .Marland Kitchen.. 1 Once Daily 7)  Potassium Chloride Crys Cr 10 Meq Tbcr (Potassium Chloride Crys Cr) .... Take 1 Tablet By Mouth Twice Daily-Pt Rx Out 8)  Nasonex 50 Mcg/act Susp (Mometasone Furoate) .... 2 Puffs Per Nostril Once Daily 9)  Zolpidem Tartrate 10 Mg Tabs (Zolpidem Tartrate) .Marland Kitchen.. 1 By Mouth At Bedtime As Needed  Current Medications (verified): 1)  Cialis 20 Mg  Tabs (Tadalafil) .Marland Kitchen.. 1 By Mouth Once Daily Prn 2)  Lipitor 80 Mg Tabs (Atorvastatin Calcium) .... Take 1/2  Tablet Once Daily 3)  Tricor 145 Mg  Tabs (Fenofibrate) .... Once Daily 4)  Bayer Aspirin 325 Mg  Tabs (Aspirin) .... Take 1 Tablet By Mouth Once A Day 5)  Tikosyn 500 Mcg  Caps (Dofetilide) .... Take 1 Tablet By  Mouth Two Times A Day 6)  Cardizem 120 Mg  Tabs (Diltiazem Hcl) .Marland Kitchen.. 1 Once Daily 7)  Potassium Chloride Crys Cr 10 Meq Tbcr (Potassium Chloride Crys Cr) .... Take 1 Tablet By Mouth Twice Daily-Pt Rx Out 8)  Nasonex 50 Mcg/act Susp (Mometasone Furoate) .... 2 Puffs Per Nostril Once Daily 9)  Zolpidem Tartrate 10 Mg Tabs (Zolpidem Tartrate) .Marland Kitchen.. 1 By Mouth At Bedtime As Needed  Allergies (verified): 1)  ! Niacin 2)  ! * Amiodarone 3)  ! * Flecainide 4)  ! * Sotalol 5)  ! 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   Head:  normocephalic and atraumatic.   Eyes:  vision grossly intact and pupils equal.   Ears:  R ear normal and L ear normal.   Nose:  no skin breakdown or pressure necrosis from cpap mask Mouth:  no gingival abnormalities and pharynx pink and moist.   Neck:  supple with < 1 cm firm adn mobile (not hard and fixed) mass to right neck just below right mid jaw line;  no other LA or masses  noted Lungs:  normal respiratory effort and normal breath sounds.   Heart:  normal rate, regular rhythm, and no gallop.   Msk:  no joint tenderness and no joint swelling.   Extremities:  no edema, no erythema  Neurologic:  cranial nerves II-XII intact, strength normal in all extremities, sensation intact to light touch, gait normal, and DTRs symmetrical and normal.  except for subtle mild overall decreased grip strength, atrophy of interosseous muscle first web space left hand, ? mild atrophy left arm generally, and decreased sensation to right 5th finger  Impression & Recommendations:  Problem # 1:  CERVICAL RADICULOPATHY, LEFT (ICD-723.4) Now more chronic after some inital gradual improved, doubt would be affected by predpack at this point;  pt declines pain med, but will check c-spine MRI, and refer NS for further eval and tx Orders: Radiology Referral (Radiology) Neurosurgeon Referral (Neurosurgeon)  Problem # 2:  SWELLING MASS OR LUMP IN HEAD AND NECK (ICD-784.2) I suspect prob benign lesion but cant be sure, will hold on CT neck but refer ENT (already has appt with ENT);  will also need to be seen for the left nasal recurrent bleeding  Problem # 3:  ALLERGIC RHINITIS (ICD-477.9)  His updated medication list for this problem includes:    Nasonex 50 Mcg/act Susp (Mometasone furoate) .Marland Kitchen... 2 puffs per nostril once daily to hold on the nasal steroid until further seen per ENT  Complete Medication List: 1)  Cialis 20 Mg Tabs (Tadalafil) .Marland Kitchen.. 1 by mouth once daily prn 2)  Lipitor 80 Mg Tabs (Atorvastatin calcium) .... Take 1/2  tablet once daily 3)  Tricor 145 Mg Tabs (Fenofibrate) .... Once daily 4)  Bayer Aspirin 325 Mg Tabs (Aspirin) .... Take 1 tablet by mouth once a day 5)  Tikosyn 500 Mcg Caps (Dofetilide) .... Take 1 tablet by mouth two times a day 6)  Cardizem 120 Mg Tabs (Diltiazem hcl) .Marland Kitchen.. 1 once daily 7)  Potassium Chloride Crys Cr 10 Meq Tbcr (Potassium chloride crys cr)  .... Take 1 tablet by mouth twice daily-pt rx out 8)  Nasonex 50 Mcg/act Susp (Mometasone furoate) .... 2 puffs per nostril once daily 9)  Zolpidem Tartrate 10 Mg Tabs (Zolpidem tartrate) .Marland Kitchen.. 1 by mouth at bedtime as needed  Other Orders: Flu Vaccine 34yrs + MEDICARE PATIENTS (Z6109) Administration Flu vaccine - MCR (U0454)  Patient Instructions: 1)  You will be contacted about the referral(s) to: MRI neck, and Neurosurgury referral 2)  Please call the number on the [facility] for results of your testing  3)  Continue all previous medications as before this visit 4)  Please keep your appt with Dr Jearld Fenton as planned 5)  You had the flu shot today 6)  Please schedule a follow-up appointment in 3 months wtih CPX labs    Flu Vaccine Consent Questions     Do you have a history of severe allergic reactions to this vaccine? no    Any prior history of allergic reactions to egg and/or gelatin? no    Do you have a sensitivity to the preservative Thimersol? no    Do you have a past history of Guillan-Barre Syndrome? no    Do you currently have an acute febrile illness? no    Have you ever had a severe reaction to latex? no    Vaccine information given and explained to patient? yes    Are you currently pregnant? no    Lot Number:[id number]   Exp Date:03/11/2011   Site Given Right Deltoid IMlu

## 2010-10-11 NOTE — Progress Notes (Signed)
   Phone Note Outgoing Call   Call placed by: rhonda Call placed to: Patient Details for Reason: pt rqst info  Summary of Call: lmfcb...need to adv that 20% of beats not time as per his question.  Initial call taken by: Claris Gladden RN,  June 17, 2010 12:31 PM  Follow-up for Phone Call        pt returning call to rhonda [phone] Glynda Jaeger  June 17, 2010 4:08 PM   Additional Follow-up for Phone Call Additional follow up Details #1::        left msg for pt, if has further questions to pls call Additional Follow-up by: Claris Gladden RN,  June 20, 2010 2:09 PM    Additional Follow-up for Phone Call Additional follow up Details #2::    adv pt of info Follow-up by: Claris Gladden RN,  June 20, 2010 3:01 PM

## 2010-10-11 NOTE — Letter (Signed)
Summary: Custom - Lipid  [facility], Main Office  [address]   [place], [place] [address]   Phone: [phone]  Fax: [phone]         October 11, 2009 MRN: [id number]   [name] [address] [place], [place]  [address]   Dear Mr. Selsor,  We have reviewed your cholesterol results.  They are as follows:     Total Cholesterol:    112 (Desirable: less than 200)       HDL  Cholesterol:     46.40  (Desirable: greater than 40 for men and 50 for women)       LDL Cholesterol:       59  (Desirable: less than 100 for low risk and less than 70 for moderate to high risk)       Triglycerides:       31.0  (Desirable: less than 150)  Our recommendations include:  Continue the same   Call our office at the number listed above if you have any questions.  Lowering your LDL cholesterol is important, but it is only one of a large number of "risk factors" that may indicate that you are at risk for heart disease, stroke or other complications of hardening of the arteries.  Other risk factors include:   A.  Cigarette Smoking* B.  High Blood Pressure* C.  Obesity* D.   Low HDL Cholesterol (see yours above)* E.   Diabetes Mellitus (higher risk if your is uncontrolled) F.  Family history of premature heart disease G.  Previous history of stroke or cardiovascular disease           *These are risk factors YOU HAVE CONTROL OVER.  For more information, visit .  There is now evidence that lowering the TOTAL CHOLESTEROL AND LDL CHOLESTEROL can reduce the risk of heart disease.  The American Heart Association recommends the following guidelines for the treatment of elevated cholesterol:  1.  If there is now current heart disease and less than two risk factors, TOTAL CHOLESTEROL should be less than 200 and LDL CHOLESTEROL should be less than 100. 2.  If there is current heart disease or two or more risk factors, TOTAL CHOLESTEROL should be less than 200 and LDL  CHOLESTEROL should be less than 70.  A diet low in cholesterol, saturated fat, and calories is the cornerstone of treatment for elevated cholesterol.  Cessation of smoking and exercise are also important in the management of elevated cholesterol and preventing vascular disease.  Studies have shown that 30 to 60 minutes of physical activity most days can help lower blood pressure, lower cholesterol, and keep your weight at a healthy level.  Drug therapy is used when cholesterol levels do not respond to therapeutic lifestyle changes (smoking cessation, diet, and exercise) and remains unacceptably high.  If medication is started, it is important to have you levels checked periodically to evaluate the need for further treatment options.  Thank you,   Dr Shawnee Knapp, RN [facility]

## 2010-10-11 NOTE — Letter (Signed)
Summary: Pre Visit Letter Revised  [facility]  [address] [place], [place] [address]   Phone: [phone]  Fax: [phone]        06/10/2010 MRN: [id number] [name] [address] [place], [place]  [address]             Procedure Date:  07/13/2010   Welcome to the Gastroenterology Division at [facility]    You are scheduled to see a nurse for your pre-procedure visit on 06/27/2010 at 4:30PM on the 3rd floor at [facility], [address]  We ask that you try to arrive at our office 15 minutes prior to your appointment time to allow for check-in.  Please take a minute to review the attached form.  If you answer "Yes" to one or more of the questions on the first page, we ask that you call the person listed at your earliest opportunity.  If you answer "No" to all of the questions, please complete the rest of the form and bring it to your appointment.    Your nurse visit will consist of discussing your medical and surgical history, your immediate family medical history, and your medications.   If you are unable to list all of your medications on the form, please bring the medication bottles to your appointment and we will list them.  We will need to be aware of both prescribed and over the counter drugs.  We will need to know exact dosage information as well.    Please be prepared to read and sign documents such as consent forms, a financial agreement, and acknowledgement forms.  If necessary, and with your consent, a friend or relative is welcome to sit-in on the nurse visit with you.  Please bring your insurance card so that we may make a copy of it.  If your insurance requires a referral to see a specialist, please bring your referral form from your primary care physician.  No co-pay is required for this nurse visit.     If you cannot keep your appointment, please call [phone] to cancel or reschedule prior to your appointment date.  This allows  [place] the opportunity to schedule an appointment for another patient in need of care.    Thank you for choosing [facility] for your medical needs.  We appreciate the opportunity to care for you.  Please visit [place] at our website  to learn more about our practice.  Sincerely, The Gastroenterology Division

## 2010-10-11 NOTE — Progress Notes (Signed)
----   Converted from flag ---- ---- 10/03/2009 2:38 PM, Barbaraann Share MD wrote: Aundra Millet, please call this patient to see if he saw dentist, is he staying on cpap, coming off, etc?  If staying on cpap, he needs ov with me at some point in spring ------------------------------  called and spoke with pt.  pt states he never went to see dentist regarding oral appliance and wishes to just stay on cpap for now. pt has already scheduled f/u appt with [facility] for 7-20-2011for 9am.

## 2010-10-11 NOTE — Progress Notes (Signed)
Summary: nasacort pa  Phone Note From Pharmacy   Summary of Call: PA request--Nasacort. Preferred product is Nasonex, Fluticasone, and Flunisolide. Please advise. Initial call taken by: Lucious Groves,  December 16, 2009 8:38 AM  Follow-up for Phone Call        ok for PA - nosebleed with flonase; or change to nasonex if ok with pt Follow-up by: [name] MD,  December 16, 2009 12:22 PM  Additional Follow-up for Phone Call Additional follow up Details #1::        left message on voicemail to call back to office. Additional Follow-up by: Lucious Groves,  December 17, 2009 3:48 PM    Additional Follow-up for Phone Call Additional follow up Details #2::    pt called and will try Nasonex Follow-up by: Margaret Pyle, CMA,  December 20, 2009 11:17 AM  New/Updated Medications: NASONEX 50 MCG/ACT SUSP (MOMETASONE FUROATE) 2 puffs per nostril once daily Prescriptions: NASONEX 50 MCG/ACT SUSP (MOMETASONE FUROATE) 2 puffs per nostril once daily  #1 x 5   Entered by:   Margaret Pyle, CMA   Authorized by:   [name] MD   Signed by:   Margaret Pyle, CMA on 12/20/2009   Method used:   Electronically to        [facility] [phone]* (retail)       [address]       [place], [place]  [address]       Ph: [phone] or [phone]       Fax: [phone]   RxID:   [id number]

## 2010-10-11 NOTE — Progress Notes (Signed)
Summary: Med Refill  Phone Note Refill Request  on December 02, 2009 9:25 AM  Refills Requested: Medication #1:  ZOLPIDEM TARTRATE 10 MG TABS 1 by mouth at bedtime as needed   Dosage confirmed as above?Dosage Confirmed   Notes: [facility] [address] [phone] Initial call taken by: Scharlene Gloss,  December 02, 2009 9:27 AM  Follow-up for Phone Call        done hardcopy to LIM side B - dahlia  Follow-up by: [name] MD,  December 02, 2009 1:10 PM  Additional Follow-up for Phone Call Additional follow up Details #1::        RX faxed to pharmacy Additional Follow-up by: Margaret Pyle, CMA,  December 02, 2009 1:17 PM    New/Updated Medications: ZOLPIDEM TARTRATE 10 MG TABS (ZOLPIDEM TARTRATE) 1 by mouth at bedtime as needed Prescriptions: ZOLPIDEM TARTRATE 10 MG TABS (ZOLPIDEM TARTRATE) 1 by mouth at bedtime as needed  #30 x 5   Entered and Authorized by:   [name] MD   Signed by:   [name] MD on 12/02/2009   Method used:   Print then Give to Patient   RxID:   [id number]

## 2010-10-11 NOTE — Progress Notes (Signed)
Summary: refill   Phone Note Refill Request   Refills Requested: Medication #1:  TIKOSYN 500 MCG  CAPS Take 1 tablet by mouth two times a day   Supply Requested: 3 months [facility] ph [phone] Fax [phone]   Method Requested: Fax to Mail Away Pharmacy Initial call taken by: Migdalia Dk,  November 09, 2009 4:45 PM  Follow-up for Phone Call       Follow-up by: Judithe Modest CMA,  November 10, 2009 8:45 AM    Prescriptions: TIKOSYN 500 MCG  CAPS (DOFETILIDE) Take 1 tablet by mouth two times a day  #180 x 0   Entered by:   Judithe Modest CMA   Authorized by:   Nathen May, MD, [facility]   Signed by:   Judithe Modest CMA on 11/10/2009   Method used:   Faxed to ...       [facility] (mail-order)             , [place]         Ph:        Fax: [phone]   RxID:   [id number]

## 2010-10-11 NOTE — Letter (Signed)
Summary: New Patient letter  [facility]  [address] [place], [place] [address]   Phone: [phone]  Fax: [phone]       07/05/2010 MRN: [id number]  [name] [address] [place], [place]  [address]  Dear [name],  Welcome to the Gastroenterology Division at [facility]    You are scheduled to see Dr.  Christella Hartigan  on 07/22/10 at 3:15 pm on the 3rd floor at [facility] [address].  We ask that you try to arrive at our office 15 minutes prior to your appointment time to allow for check-in.  We would like you to complete the enclosed self-administered evaluation form prior to your visit and bring it with you on the day of your appointment.  We will review it with you.  Also, please bring a complete list of all your medications or, if you prefer, bring the medication bottles and we will list them.  Please bring your insurance card so that we may make a copy of it.  If your insurance requires a referral to see a specialist, please bring your referral form from your primary care physician.  Co-payments are due at the time of your visit and may be paid by cash, check or credit card.     Your office visit will consist of a consult with your physician (includes a physical exam), any laboratory testing he/she may order, scheduling of any necessary diagnostic testing (e.g. x-ray, ultrasound, CT-scan), and scheduling of a procedure (e.g. Endoscopy, Colonoscopy) if required.  Please allow enough time on your schedule to allow for any/all of these possibilities.    If you cannot keep your appointment, please call [phone] to cancel or reschedule prior to your appointment date.  This allows [place] the opportunity to schedule an appointment for another patient in need of care.  If you do not cancel or reschedule by 5 p.m. the business day prior to your appointment date, you will be charged a $50.00 late cancellation/no-show fee.    Thank you for choosing  [facility] for your medical needs.  We appreciate the opportunity to care for you.  Please visit [place] at our website  to learn more about our practice.                     Sincerely,                                                             The Gastroenterology Division

## 2010-10-11 NOTE — Assessment & Plan Note (Signed)
Summary: pc2  Medications Added POTASSIUM CHLORIDE CRYS CR 10 MEQ TBCR (POTASSIUM CHLORIDE CRYS CR) Take 1 tablet by mouth twice daily-pt rx out      Allergies Added:   History of Present Illness: 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 He also has paroxysmal atrial fibrillation and sleep apnea. He is taking Tikosyn which heis tolerating well. 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 This has had a great effect on his overall quality of life and his ability to exercise. He denies any impairment in exercise tolerance.   Current Medications (verified): 1)  Cialis 20 Mg  Tabs (Tadalafil) .Marland Kitchen.. 1 By Mouth Once Daily Prn 2)  Lipitor 80 Mg Tabs (Atorvastatin Calcium) .... Take 1/2  Tablet Once Daily 3)  Tricor 145 Mg  Tabs (Fenofibrate) .... Once Daily 4)  Bayer Aspirin 325 Mg  Tabs (Aspirin) .... Take 1 Tablet By Mouth Once A Day 5)  Tikosyn 500 Mcg  Caps (Dofetilide) .... Take 1 Tablet By Mouth Two Times A Day 6)  Cardizem 120 Mg  Tabs (Diltiazem Hcl) .Marland Kitchen.. 1 Once Daily 7)  Potassium Chloride Crys Cr 10 Meq Tbcr (Potassium Chloride Crys Cr) .... Take 1 Tablet By Mouth Twice Daily-Pt Rx Out 8)  Nasonex 50 Mcg/act Susp (Mometasone Furoate) .... 2 Puffs Per Nostril Once Daily 9)  Zolpidem Tartrate 10 Mg Tabs (Zolpidem Tartrate) .Marland Kitchen.. 1 By Mouth At Bedtime As Needed  Allergies (verified): 1)  ! Niacin 2)  ! * Amiodarone 3)  ! * Flecainide 4)  ! * Sotalol 5)  ! * Rhythmol 6)  Ace Inhibitors  Past History:  Past Medical History: Last updated: 12/01/2008 Hyperlipidemia atrial fibrillation Coronary artery disease Peripheral vascular disease Allergic rhinitis Depression OSA - CPAP bilateral popliteal aneurysms arthritis Diverticulosis, colon  Past Surgical History: Last updated: 01/07/2008 Inguinal herniorrhaphy s/p stent x 2 s/p pacemaker due to bradycardia s/p right fem-pop bypass s/p left fem-pop bypass s/p  hand surgury s/p left knee surgury  Family History: Last updated: 01/07/2008 vascular disease DM OSA  Social History: Last updated: 01/07/2008 Married Never Smoked 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 HEENT Normal.  Neck veins were flat, carotids were brisk.  Lungs were clear.  Heart sounds were regular without murmurs or gallops.  Abdomen was soft with active bowel sounds. There is no clubbing cyanosis or edema. Skin Warm and dry    EKG  Procedure date:  05/10/2010  Findings:      atrial paced rhythm at 60 intervals 0.16/0.08/0.43  PPM Specifications Following MD:  Sherryl Manges, MD     [facility] Vendor:  [facility] [employer]     PPM Model Number:  [phone]     PPM Serial Number:  [id number] PPM DOI:  11/09/2003     PPM Implanting MD:  Sherryl Manges, MD  Lead 1    Location: RA     DOI: 09/25/1996     Model #: 098-11  Serial #: [id number]     Status: active Lead 2    Location: RV     DOI: 09/25/1996     Model #: 430-10     Serial #: [id number]     Status: active  Magnet Response Rate:  BOL 100 ERI 85  Indications:  SINUS NODE DYSFUNCTION, AFIB   PPM Follow Up Battery Voltage:  GOOD V     Battery Est. Longevity:  2 YRS     Pacer Dependent:  No       PPM Device Measurements Atrium  Amplitude: 1.4 mV, Impedance: 380 ohms, Threshold: 0.7 V at 0.4 msec Right Ventricle  Amplitude: 2.9 mV, Impedance: 300 ohms, Threshold: 1/0 V at 0.4 msec  Episodes Coumadin:  No Ventricular High Rate:  0     Atrial Pacing:  75%     Ventricular Pacing:  15%  Parameters Mode:  DDIR     Lower Rate Limit:  60     Upper Rate Limit:  135 Paced  AV Delay:  250     Next Cardiology Appt Due:  10/12/2010 Tech Comments:  PT IN DDIR MODE -SEVERAL EPISODES OF ATR. - COUMADIN DUE TO BLEED.  NORMAL DEVICE FUNCTION.  BATTERY LONGEVITY 2 YRS.  NO CHANGES MADE. ROV IN 6 MTHS W/SK. 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 Take 1 tablet by mouth once a day    Tikosyn 500 Mcg Caps (Dofetilide) .Marland Kitchen... Take 1 tablet by mouth two times a day    Cardizem 120 Mg Tabs (Diltiazem hcl) .Marland Kitchen... 1 once daily  Problem # 3:  ATRIAL FIBRILLATION (ICD-427.31) Atrial fibrillation by device interrogation is 20%; time versus be(ats) we will continue on Cleocin. We need to measure his potassium and magnesium which we will do next week.  QTC is within range for Tikosyn His updated medication list for this problem includes:    Bayer Aspirin 325 Mg Tabs (Aspirin) .Marland Kitchen... Take 1 tablet by mouth once a day    Tikosyn 500 Mcg Caps (Dofetilide) .Marland Kitchen... Take 1 tablet by mouth two times a day  Problem # 4:  OBSTRUCTIVE SLEEP APNEA (ICD-327.23) doing relatively well and encouraged him to try to work on his exercise her program  Patient Instructions: 1)  Your physician recommends that you return for lab work in ONE WEEK: BMET AND MAG, 427.31, V4501 2)  Your physician recommends that you continue on your current medications as directed. Please refer to the Current Medication list given to you today. 3)  Your physician recommends that you schedule a follow-up appointment in: 6 MONTHS

## 2010-10-11 NOTE — Letter (Signed)
Summary: Colonoscopy-Changed to Office Visit Letter  [facility]  [address] [place], [place] [address]   Phone: [phone]  Fax: [phone]      February 25, 2010 MRN: [id number]   YOGESH COMINSKY [address] [place], [place]  [address]   Dear Mr. Mayden,   According to our records, it is time for you to schedule a Colonoscopy. However, after reviewing your medical record, I feel that an office visit would be most appropriate to more completely evaluate you and determine your need for a repeat procedure.  Please call [phone] (option #2) at your convenience to schedule an office visit. If you have any questions, concerns, or feel that this letter is in error, we would appreciate your call.   Sincerely,  Rachael Fee, M.D.  [facility]astroenter[facility] [phone]

## 2010-10-11 NOTE — Consult Note (Signed)
Summary: [facility]   Imported By: Sherian Rein 06/24/2010 13:26:49  _____________________________________________________________________  External Attachment:    Type:   Image     Comment:   External Document

## 2010-10-11 NOTE — Progress Notes (Signed)
  Phone Note From Other Clinic   Caller: [facility], MRI Summary of Call: [facility] called to inform they could not do pts. scheduled MRI this afternoon at 4:00 (06/06/10) due to the patient having a pacemaker. Marylu Lund at [facility] did speak to the patient to inform and pt. was concerned as what to do next. Initial call taken by: Robin Ewing CMA Duncan Dull),  June 06, 2010 2:53 PM  Follow-up for Phone Call        please apologize to the pt, I should have realized about the pacemaker and the MRI;  He should still see the Neurosurgeon as planned, who will most likely order the CT neck in a certain way that they normally do it (which can be done with the pacemaker present) Follow-up by: [name] MD,  June 06, 2010 3:36 PM  Additional Follow-up for Phone Call Additional follow up Details #1::        Called pt and informed of above information. Additional Follow-up by: Robin Ewing CMA Duncan Dull),  June 06, 2010 3:56 PM

## 2010-10-11 NOTE — Cardiovascular Report (Signed)
Summary: Office Visit   Office Visit   Imported By: Roderic Ovens 05/11/2010 12:28:48  _____________________________________________________________________  External Attachment:    Type:   Image     Comment:   External Document

## 2010-10-11 NOTE — Assessment & Plan Note (Signed)
Summary: 6 month lipid f/up.[name]   CC:  dyslipidemia follow-up.  History of Present Illness:  Lipid Clinic Visit      The patient comes in today for dyslipidemia follow-up.  The patient has no history of chest pain, shortness of breath, muscle aches, and muscle cramps.  Dietary compliance review reveals an overall grade of eating 5 or more fruits and vegetables and limiting fats and TFA's.  Review of exercise habits reveals that the patient is not exercising becuase has gotten busy at work - but plan to restart.  [name] returns to lipid clinic today.  He has been doing well since his last visit.  He denies muscle aches or weakness associated with his combination lipid-lowering regimen.  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  He has included some walking into his regimen, but has had no regimen of regular exercise since last visit.  He has been compliant with his low-fat, low-cholesterol diet, and works to use divided doses multiple times each day to avoid peaks/troughs associated with his therapy.    Lipid Management Provider  Shelby Dubin, PharmD, BCPS, CPP  Preventive Screening-Counseling & Management  Alcohol-Tobacco     Alcohol drinks/day: 0     Smoking Status: never  Caffeine-Diet-Exercise     Caffeine use/day: 2-3 cups of coffee and 2-3 sodas  Current Medications (verified): 1)  Cialis 20 Mg  Tabs (Tadalafil) .[name].. 1 By Mouth Once Daily Prn 2)  Lipitor 80 Mg Tabs (Atorvastatin Calcium) .... Take 1  Tablet Once Daily 3)  Tricor 145 Mg  Tabs (Fenofibrate) .... Once Daily 4)  Bayer Aspirin 325 Mg  Tabs (Aspirin) .... Take 1 Tablet By Mouth Once A Day 5)  Tikosyn 500 Mcg  Caps (Dofetilide) .... Take 1 Tablet By Mouth Two Times A Day 6)  Cardizem 120 Mg  Tabs (Diltiazem Hcl) .[name].. 1 Once Daily 7)  Potassium Chloride Crys Cr 10 Meq Tbcr (Potassium Chloride Crys Cr)  .... Take 1 Tablet By Mouth Twice Daily 8)  Nasacort Aq 55 Mcg/act  Aers (Triamcinolone Acetonide(Nasal)) .... 2 Sprays Each Nostril 1 Time Qd 9)  Zolpidem Tartrate 10 Mg Tabs (Zolpidem Tartrate) .[name].. 1 By Mouth At Bedtime As Needed 10)  Ceftin 250 Mg Tabs (Cefuroxime Axetil) .... Take One Tab By Mouth Two Times A Day  Allergies (verified): 1)  ! Niacin 2)  ! * Amiodarone 3)  ! * Flecainide 4)  ! * Sotalol 5)  ! * Rhythmol 6)  Ace Inhibitors  Social History: Alcohol drinks/day:  0 Caffeine use/day:  2-3 cups of coffee and 2-3 sodas   Vital Signs:  Patient profile:   70 year old male Height:      75 inches Weight:      211 pounds Pulse rate:   80 / minute Pulse rhythm:   regular BP sitting:   120 / 60  (right arm) Cuff size:   regular  Impression & Recommendations:  Problem # 1:  HYPERLIPIDEMIA (ICD-272.4)  His updated medication list for this problem includes:    Lipitor 80 Mg Tabs (Atorvastatin calcium) .[name]... Take 1  tablet once daily - pt actually taking 1/2 tab daily = 40mg  QD    Tricor 145 Mg Tabs (Fenofibrate) ..... Once daily  [name] returns to lipid clinic with no complaints.  He had stopped exercising over the holiday season and has not  been motivated to restart.  I reemphasized the importance of exercise and he is willling to restart an exercise  program sneakers for silvers at the [facility].    He does follow a heart healthy diet.  H e ats lots of fruits and vegies, low CHO and broiled/grilled baked fish or chicken. TC  112    at goal < 200   TG  31  at goal < 150   LDL   59  at goal < 70 and HDL  46  at goal > 40  f/u in 6 months Prescriptions: LIPITOR 80 MG TABS (ATORVASTATIN CALCIUM) take 1  tablet once daily  #90 x 3   Entered by:   Leota Sauers, PharmD, BCPS, CPP   Authorized by:   Gaylord Shih, MD, [facility]   Signed by:   Leota Sauers, PharmD, BCPS, CPP on 10/14/2009   Method used:   Electronically to        [facility] (mail-order)        [address]       [place], [place]  [address]       Ph: [phone]       Fax: [phone]   RxID:   [id number]

## 2010-10-11 NOTE — Letter (Signed)
Summary: [facility] Instructions  [facility]  [address] [place], [place] [address]   Phone: [phone]  Fax: [phone]       [name]    [birth year]    MRN: [id number]        Procedure Day Dorna Bloom:  [facility]  07/13/10     Arrival Time:  10:30AM     Procedure Time:  11:30AM     Location of Procedure:                    _ X_  [facility] (4th Floor)                     PREPARATION FOR COLONOSCOPY WITH MOVIPREP   Starting 5 days prior to your procedure 07/08/10 do not eat nuts, seeds, popcorn, corn, beans, peas,  salads, or any raw vegetables.  Do not take any fiber supplements (e.g. Metamucil, Citrucel, and Benefiber).  THE DAY BEFORE YOUR PROCEDURE         DATE:  07/12/10    DAY:  TUESDAY  1.  Drink clear liquids the entire day-NO SOLID FOOD  2.  Do not drink anything colored red or purple.  Avoid juices with pulp.  No orange juice.  3.  Drink at least 64 oz. (8 glasses) of fluid/clear liquids during the day to prevent dehydration and help the prep work efficiently.  CLEAR LIQUIDS INCLUDE: Water Jello Ice Popsicles Tea (sugar ok, no milk/cream) Powdered fruit flavored drinks Coffee (sugar ok, no milk/cream) Gatorade Juice: apple, white grape, white cranberry  Lemonade Clear bullion, consomm, broth Carbonated beverages (any kind) Strained chicken noodle soup Hard Candy                             4.  In the morning, mix first dose of MoviPrep solution:    Empty 1 Pouch A and 1 Pouch B into the disposable container    Add lukewarm drinking water to the top line of the container. Mix to dissolve    Refrigerate (mixed solution should be used within 24 hrs)  5.  Begin drinking the prep at 5:00 p.m. The MoviPrep container is divided by 4 marks.   Every 15 minutes drink the solution down to the next mark (approximately 8 oz) until the full liter is complete.   6.  Follow completed prep with 16 oz of clear liquid of your choice  (Nothing red or purple).  Continue to drink clear liquids until bedtime.  7.  Before going to bed, mix second dose of MoviPrep solution:    Empty 1 Pouch A and 1 Pouch B into the disposable container    Add lukewarm drinking water to the top line of the container. Mix to dissolve    Refrigerate  THE DAY OF YOUR PROCEDURE      DATE: 07/13/10   DAY:  WEDNESDAY  Beginning at 6:30AM (5 hours before procedure):         1. Every 15 minutes, drink the solution down to the next mark (approx 8 oz) until the full liter is complete.  2. Follow completed prep with 16 oz. of clear liquid of your choice.    3. You may drink clear liquids until 9:30AM (2 HOURS BEFORE PROCEDURE).   MEDICATION INSTRUCTIONS  Unless otherwise instructed, you should take regular prescription medications with a small sip of water   as early as  possible the morning of your procedure.        OTHER INSTRUCTIONS  You will need a responsible adult at least 71 years of age to accompany you and drive you home.   This person must remain in the waiting room during your procedure.  Wear loose fitting clothing that is easily removed.  Leave jewelry and other valuables at home.  However, you may wish to bring a book to read or  an iPod/MP3 player to listen to music as you wait for your procedure to start.  Remove all body piercing jewelry and leave at home.  Total time from sign-in until discharge is approximately 2-3 hours.  You should go home directly after your procedure and rest.  You can resume normal activities the  day after your procedure.  The day of your procedure you should not:   Drive   Make legal decisions   Operate machinery   Drink alcohol   Return to work  You will receive specific instructions about eating, activities and medications before you leave.    The above instructions have been reviewed and explained to me by   Doristine Church RN II  June 27, 2010 5:03 PM     I fully  understand and can verbalize these instructions _____________________________ Date _________

## 2010-10-11 NOTE — Progress Notes (Signed)
Summary: refill  Medications Added CARDIZEM 120 MG  TABS (DILTIAZEM HCL) 1 once daily       Phone Note Refill Request Message from:  Patient on Feb 04, 2010 2:59 PM  Refills Requested: Medication #1:  TIKOSYN 500 MCG  CAPS Take 1 tablet by mouth two times a day  Medication #2:  CARDIZEM 120 MG  TABS 1 once daily Tikosyn 500mg   Prescription [facility] [phone]  AND SEND Cardizem 120mg  to [facility]  [phone]  Initial call taken by: Judie Grieve,  Feb 04, 2010 3:02 PM  Follow-up for Phone Call        RX sent into pharmacy. Pt notified.  Marrion Coy, CNA  Feb 04, 2010 4:29 PM  Follow-up by: Marrion Coy, CNA,  Feb 04, 2010 4:29 PM    New/Updated Medications: CARDIZEM 120 MG  TABS (DILTIAZEM HCL) 1 once daily Prescriptions: TIKOSYN 500 MCG  CAPS (DOFETILIDE) Take 1 tablet by mouth two times a day  #180 x 3   Entered by:   Marrion Coy, CNA   Authorized by:   Nathen May, MD, [facility]   Signed by:   Marrion Coy, CNA on 02/04/2010   Method used:   Electronically to        [facility]* (mail-order)       [address]       [place], [place]  [address]       Ph: [phone]       Fax: [phone]   RxID:   [id number] CARDIZEM 120 MG  TABS (DILTIAZEM HCL) 1 once daily  #90 x 2   Entered by:   Marrion Coy, CNA   Authorized by:   Rollene Rotunda, MD, [facility]   Signed by:   Marrion Coy, CNA on 02/04/2010   Method used:   Electronically to        [facility]* (mail-order)       [address]       [place], [place]  [address]       Ph: [phone]       Fax: [phone]   RxID:   [id number] CARDIZEM 120 MG  TABS (DILTIAZEM HCL) 1 once daily  #30 x 6   Entered by:   Marrion Coy, CNA   Authorized by:   Rollene Rotunda, MD, [facility]   Signed by:   Marrion Coy, CNA on 02/04/2010   Method used:   Electronically to        [facility] [phone]* (retail)       [address]       [place], [place]  [address]       Ph: [phone] or [phone]       Fax: [phone]   RxID:   [id number]

## 2010-10-11 NOTE — Procedures (Signed)
Summary: Colonoscopy/[facility]  Colonoscopy/ Endoscopy Ctr   Imported By: Sherian Rein 02/28/2010 09:46:41  _____________________________________________________________________  External Attachment:    Type:   Image     Comment:   External Document

## 2010-10-11 NOTE — Miscellaneous (Signed)
Summary: [facility]  Clinical Lists Changes  Medications: Added new medication of MOVIPREP 100 GM  SOLR (PEG-KCL-NACL-NASULF-NA ASC-C) As per prep instructions. - Signed Rx of MOVIPREP 100 GM  SOLR (PEG-KCL-NACL-NASULF-NA ASC-C) As per prep instructions.;  #1 x 0;  Signed;  Entered by: Doristine Church RN II;  Authorized by: Rachael Fee MD;  Method used: Electronically to [id number]*, [address] [place], [place], [place]  [address], Ph: [phone] or [phone], Fax: [phone] Observations: Added new observation of ALLERGY REV: Done (06/27/2010 16:33)    Prescriptions: MOVIPREP 100 GM  SOLR (PEG-KCL-NACL-NASULF-NA ASC-C) As per prep instructions.  #1 x 0   Entered by:   Doristine Church RN II   Authorized by:   Rachael Fee MD   Signed by:   Doristine Church RN II on 06/27/2010   Method used:   Electronically to        [facility] [phone]* (retail)       [address]       [place], [place]  [address]       Ph: [phone] or [phone]       Fax: [phone]   RxID:   [id number]

## 2010-10-11 NOTE — Assessment & Plan Note (Signed)
History of Present Illness Visit Type: Initial Visit Primary GI MD: Rob Bunting MD Primary Provider: Oliver Barre, MD Chief Complaint: Discuss colonoscopy and recall colon.  History of Present Illness:     very pleasant 70 year old man who underwent colonoscopy by Dr. Victorino Dike in June 2006. He had hemorrhoids and left-sided diverticulosis. No polyps were noted. His father had colon polyps, unclear pathology, when he was in his 45s or 17s. The patient himself has never had colon polyps. He is never had colon cancer.  Has been more gassy lately.  He occasionaly gets blood on TP, bright red.  Never really constipated.    Overall stable weight over many years.           Current Medications (verified): 1)  Cialis 20 Mg  Tabs (Tadalafil) .Marland Kitchen.. 1 By Mouth Once Daily Prn 2)  Lipitor 80 Mg Tabs (Atorvastatin Calcium) .... Take 1/2  Tablet Once Daily 3)  Tricor 145 Mg  Tabs (Fenofibrate) .... Once Daily 4)  Bayer Aspirin 325 Mg  Tabs (Aspirin) .... Take 1 Tablet By Mouth Once A Day 5)  Tikosyn 500 Mcg  Caps (Dofetilide) .... Take 1 Tablet By Mouth Two Times A Day 6)  Cardizem 120 Mg  Tabs (Diltiazem Hcl) .Marland Kitchen.. 1 Once Daily 7)  Potassium Chloride Crys Cr 10 Meq Tbcr (Potassium Chloride Crys Cr) .... Take 2 Tablet By Mouth Twice Daily-Pt Rx Out 8)  Nasonex 50 Mcg/act Susp (Mometasone Furoate) .... 2 Puffs Per Nostril Once Daily 9)  Zolpidem Tartrate 10 Mg Tabs (Zolpidem Tartrate) .Marland Kitchen.. 1 By Mouth At Bedtime As Needed 10)  Moviprep 100 Gm  Solr (Peg-Kcl-Nacl-Nasulf-Na Asc-C) .... As Per Prep Instructions.  Allergies (verified): 1)  ! Niacin 2)  ! * Amiodarone 3)  ! * Flecainide 4)  ! * Sotalol 5)  ! * Rhythmol 6)  Ace Inhibitors  Past History:  Past Medical History: Hyperlipidemia atrial fibrillation Coronary artery disease Peripheral vascular disease Allergic rhinitis Depression OSA - CPAP bilateral popliteal aneurysms arthritis Diverticulosis, colon   Routine risk for  colon cancer, colonoscopy 2006 by Dr. 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  All other review of systems was otherwise negative.   Vital Signs:  Patient profile:   70 year old male Height:      75.5 inches Weight:      213 pounds BMI:     26.37 Pulse rate:   66 / minute Pulse rhythm:   regular BP sitting:   126 / 64  (right arm) Cuff size:   regular  Vitals Entered By: Christie Nottingham CMA Duncan Dull) (July 22, 2010 3:29 PM)  Physical Exam  Additional Exam:  Constitutional: generally well appearing Psychiatric: alert and oriented times 3 Eyes: extraocular movements intact Mouth: oropharynx moist, no lesions Neck: supple, no lymphadenopathy Cardiovascular: heart regular rate and rythm Lungs: CTA bilaterally Abdomen: soft, non-tender, non-distended, no obvious ascites, no peritoneal signs, normal bowel sounds Extremities: no lower extremity edema bilaterally  Skin: no lesions on visible extremities    Impression & Recommendations:  Problem # 1:  Routine risk for colon cancer he originally was told by Dr. Corinda Gubler to have a colonoscopy in 5 years and undergo annual Hemoccult testing. Current nationally accepted colon cancer screening guidelines actually recommend a colonoscopy in 10 years for routine risk patients. There is no need for annual Hemoccults. This of course assumes that he is having no symptoms of bowel issues. I went over the symptoms with him, less than  now for him and he understands and agrees. We'll therefore plan for him to have a colonoscopy in June 2016 unless new symptoms arise.  Patient Instructions: 1)  Recall colonoscopy in June, 2016. 2)  This assume you have no new GI symptoms (changes in bowels, bleeding, unexplained weight loss). 3)  A copy of this information will be sent to Dr. Jonny Ruiz. 4)  The medication list was reviewed and reconciled.  All changed / newly prescribed medications were explained.  A complete medication list was provided to the patient / caregiver.

## 2010-10-26 ENCOUNTER — Other Ambulatory Visit: Payer: Self-pay | Admitting: Otolaryngology

## 2010-10-26 DIAGNOSIS — K118 Other diseases of salivary glands: Secondary | ICD-10-CM

## 2010-11-01 ENCOUNTER — Ambulatory Visit
Admission: RE | Admit: 2010-11-01 | Discharge: 2010-11-01 | Disposition: A | Discharge status: Home / Self Care | Payer: MEDICARE | Source: Ambulatory Visit | Attending: Otolaryngology | Admitting: Otolaryngology

## 2010-11-01 DIAGNOSIS — K118 Other diseases of salivary glands: Secondary | ICD-10-CM

## 2010-11-01 MED ORDER — IOHEXOL 300 MG/ML  SOLN
75.0000 mL | Freq: Once | INTRAMUSCULAR | Status: AC | PRN
  Administered 2010-11-01: 75 mL via INTRAVENOUS

## 2010-12-06 ENCOUNTER — Other Ambulatory Visit: Payer: Self-pay | Admitting: Cardiology

## 2010-12-08 ENCOUNTER — Other Ambulatory Visit: Payer: Self-pay | Admitting: *Deleted

## 2010-12-08 DIAGNOSIS — E785 Hyperlipidemia, unspecified: Secondary | ICD-10-CM

## 2010-12-08 MED ORDER — ATORVASTATIN CALCIUM 80 MG PO TABS: ORAL_TABLET | ORAL | Status: DC

## 2010-12-08 NOTE — Telephone Encounter (Signed)
[address] °

## 2011-01-13 ENCOUNTER — Other Ambulatory Visit: Payer: Self-pay | Admitting: Internal Medicine

## 2011-01-16 ENCOUNTER — Telehealth: Payer: Self-pay | Admitting: *Deleted

## 2011-01-16 ENCOUNTER — Telehealth: Payer: Self-pay | Admitting: Internal Medicine

## 2011-01-16 MED ORDER — DILTIAZEM HCL ER 120 MG PO CP24: 120.0000 mg | ORAL_CAPSULE | Freq: Every day | ORAL | Status: DC

## 2011-01-16 MED ORDER — FENOFIBRATE 145 MG PO TABS: 145.0000 mg | ORAL_TABLET | Freq: Every day | ORAL | Status: DC

## 2011-01-16 MED ORDER — DOFETILIDE 500 MCG PO CAPS: 500.0000 ug | ORAL_CAPSULE | Freq: Two times a day (BID) | ORAL | Status: DC

## 2011-01-16 MED ORDER — POTASSIUM CHLORIDE 10 MEQ PO TBCR: 10.0000 meq | EXTENDED_RELEASE_TABLET | Freq: Every day | ORAL | Status: DC

## 2011-01-16 MED ORDER — ATORVASTATIN CALCIUM 80 MG PO TABS: 80.0000 mg | ORAL_TABLET | Freq: Every day | ORAL | Status: DC

## 2011-01-16 NOTE — Telephone Encounter (Signed)
Walk in Pt Form " Pt Dropped Off paper for Dr.Klein to Review" sent to Message Nurse 01/16/11/km

## 2011-01-16 NOTE — Telephone Encounter (Signed)
SPOKE WITH PT  RE NEEDING REFILL ON MEDS AND CHECKING WHEN NEEDS TO SEE DR Graciela Husbands  PER LAST OFFICE NOTE PT WAS DUE TO BE SEN IN FEB APPT MADE FOR 02/17/11./CY

## 2011-01-24 NOTE — Procedures (Signed)
BYPASS GRAFT EVALUATION   INDICATION:  Followup bilateral bypass graft.  The patient states known  claudication.   HISTORY:  Diabetes:  No.  Cardiac:  Stent March 2004, pacemaker 2005.  Hypertension:  No.  Smoking:  No.  Previous Surgery:  Right distal superficial femoral artery to below knee  popliteal artery bypass graft with resection of right popliteal aneurysm  and thrombectomy of right tibial arteries on 08/17/2002.  Left above  knee popliteal to below knee popliteal bypass graft with a saphenous  vein 02/19/2002, both by Dr. Madilyn Fireman.   SINGLE LEVEL ARTERIAL EXAM                               RIGHT              LEFT  Brachial:                    112                114  Anterior tibial:             128                140  Posterior tibial:            140                138  Peroneal:  Ankle/brachial index:        1.23               1.23   PREVIOUS ABI:  Date:  08/16/2007  RIGHT:  >1.0  LEFT:  >1.0   LOWER EXTREMITY BYPASS GRAFT DUPLEX EXAM:   DUPLEX:  Bilateral Doppler arterial waveforms appear triphasic proximal  to, within and distal to bypass grafts.   IMPRESSION:  1. Bilateral ABIs are within normal limits and stable.  2. Patent right distal superficial femoral artery to below knee      popliteal artery bypass graft.  3. Patent left above knee to below knee popliteal artery bypass graft.  4. No significant changes from previous study.       ___________________________________________  P. Liliane Bade, M.D.   AS/MEDQ  D:  08/19/2008  T:  08/19/2008  Job:  [id number]

## 2011-01-24 NOTE — Op Note (Signed)
[name], [name]               ACCOUNT NO.:  [id number]   MEDICAL RECORD NO.:  [id number]          PATIENT TYPE:  AMB   LOCATION:  DAY                          FACILITY:  [facility]   PHYSICIAN:  Adolph Pollack, M.D.DATE OF BIRTH:  12-28-1940   DATE OF PROCEDURE:  06/20/2007  DATE OF DISCHARGE:                               OPERATIVE REPORT   PREOPERATIVE DIAGNOSIS:  Right inguinal hernia.   POSTOPERATIVE DIAGNOSIS:  [place] right inguinal hernia.   PROCEDURE:  Right inguinal hernia repair with mesh.   ANESTHESIA:  General.   INDICATIONS:  [name] is a 70 year old male who was working and  felt a pulling and tearing and developed a bulge.  He has a pacemaker  and is on Coumadin and aspirin.  He has symptoms from the hernia and now  presents for repair.  The Coumadin has been stopped but the cardiologist  did not want to stop his aspirin, thus he is taking it. He realizes he  has a slightly increased risk of having problems with bleeding and will  have more bruising than usual.   TECHNIQUE:  He is seen in the holding area and the right thigh marked  with my initials.  He was taken to the operating room and placed supine  on the operating table and general anesthetic was administered.  The  hair in the right groin area was clipped and the area sterilely prepped  and draped.  Marcaine solution was infiltrated superficially and deep in  the right groin.  A right groin incision was made through the skin,  subcutaneous tissue, and Scarpa's fascia until the external oblique  aponeurosis was exposed.  I then injected local anesthetic deep to the  external oblique aponeurosis.  An incision was made in the external  oblique aponeurosis through the external ring medially and toward the  anterior superior iliac spine laterally.  The ilioinguinal nerve was  retracted superiorly out of the plane of dissection.  Using blunt  dissection, I identified the internal oblique muscle  aponeurosis  superiorly and shelving edge of the inguinal ligament inferiorly.  I  isolated the spermatic cord, created a window around it, doing posterior  dissection bluntly carefully.  There appeared to be both a direct and  indirect defect as I noted an indirect sac attached to the cord.   Using blunt dissection, I separated the sac from the spermatic cord  contents and replaced it back through an enlarged internal ring.  Following this, I retracted the cord superiorly.  I repaired the hernia  with a piece of 3 x 6 inch polypropylene mesh.  The mesh was anchored 1-  2 cm medial to the pubic tubercle with 2-0 Prolene suture, then anchored  to the shelving edge of the inguinal ligament inferiorly with a running  2-0 Prolene suture up to a level 1-2 cm lateral to the internal ring.  A  slit was cut in the mesh and two tails were wrapped around the cord.  The superior aspect of the mesh was anchored to the internal oblique  aponeurosis with interrupted 2-0 Vicryl sutures.  Following this, the  two tails of the mesh were crossed creating a new internal ring and  these were anchored to the shelving edge of the inguinal ligament with a  single 2-0 Prolene suture.  The lateral aspect of the mesh was then  tucked deep to the external oblique aponeurosis.   At this point, hemostasis was noted to be adequate.  The external  oblique aponeurosis was then closed over the mesh and cord with running  3-0 Vicryl suture.  Scarpa's fascia was approximated with running 2-0  Vicryl suture.  The skin was closed with 4-0 Monocryl subcuticular  stitch, followed by Steri-Strips and a sterile dressing.  The right  testicle was in its normal position in the scrotum.  He tolerated the  procedure well without apparent complications and was taken to recovery  in satisfactory condition.  He has arrangements to restart his Coumadin  tomorrow and have his INR checked one week from today.      Adolph Pollack, M.D.  Electronically Signed     TJR/MEDQ  D:  06/20/2007  T:  06/20/2007  Job:  [id number]   cc:   [name], MD  [address]  [place]  [place] [address]   Rollene Rotunda, MD, [facility]  [address]  [address]  [place]  [place] [address]

## 2011-01-24 NOTE — Assessment & Plan Note (Signed)
[facility]                               LIPID CLINIC NOTE   NAME:SKEEHANDestan, Franchini                      MRN:          [id number]  DATE:10/17/2007                            DOB:          10/02/1940    Return office visit for lipid clinic.   PAST MEDICAL HISTORY:  Hyperlipidemia, atrial fibrillation, coronary  artery disease, peripheral vascular disease, insomnia, depression and  seasonal allergies.   MEDICATIONS:  1. Claritin 5 mg daily.  2. Lipitor 40 mg daily.  3. Aspirin 162 mg daily.  4. Fish oil 1 capsule four times daily.  5. Potassium 10 mEq daily.  6. Diltiazem 120 mg daily.  7. Tricor 145 mg daily.  8. Coumadin as directed.  9. Tikosyn 500 mg twice daily.   VITAL SIGNS:  Weight 208 pounds, blood pressure 100/70, heart rate 60.   LABORATORY DATA:  Total cholesterol 110, triglyceride 29, HDL 40, LDL  64.  LFTs within normal limits.   ASSESSMENT:  Mr. Logan is a very pleasant, elderly gentleman who  returns to the lipid clinic today with no chest pain, no shortness of  breath, no muscle aches or pains.  He is compliant with current  medication regimen. His total cholesterol is at goal of less than 200,  triglycerides at goal of less than 150, HDL at goal of greater than 40,  LDL at goal of less than 70 and LFTs within normal limits.  He follows a  very strict low fat,  low carbohydrate diet.  He in the past was very  compliant with an exercise regimen.  However, he had a hernia repair in  October 2008 which has decreased his ability to exercise over the last  several months.  He is restarting his exercise as tolerated.  However,  he is still weak and unable to walk as much as he previously did.  He  walks Saturday and Sunday about two to three miles, but during the week  limits himself to approximately 30 minutes while at work.  He is willing  to increase his exercise as he can and as he becomes more stronger.  He  also has made  some small diet changes where he has decreased the carbs  and fats in his diet, decreased the amount of juice and nuts that he was  eating.  I have commended Mr. Juhasz on a job well done where his whole  lipid panel is at goal.  We will make no medication changes and our plan  is to continue the great job  and follow-up visit in 6 months for LFTs and lipid panel and will make  adjustments needed at that time.      Leota Sauers, PharmD  Electronically Signed      Jesse Sans. Daleen Squibb, MD, [facility]  Electronically Signed   LC/MedQ  DD: 10/17/2007  DT: 10/18/2007  Job #: [id number]

## 2011-01-24 NOTE — Assessment & Plan Note (Signed)
[facility]                         ELECTROPHYSIOLOGY OFFICE NOTE   NAME:[name], [name]                      MRN:          [id number]  DATE:07/02/2008                            DOB:          Jan 13, 1941    HISTORY:  [name] is seen in followup for atrial fibrillation which  in him has been closely correlated with sleep apnea.  He noted over the  last 6 months, he has had more irregularities particularly with  exercise.  He also noted that he was having more sleep apnea, and it  turned out upon his own investigation that his sleep apnea machine had  been recalled, that was replaced, and then through further up titration  over the last couple of months from a pressure of 12 now to 15.6, he has  noted a marked diminution in his nocturnal arousals symptoms and with  that a significant decrease in his atrial fibrillation.  Comparing the  data from now until April, there has been an increase in his atrial  fibrillation by counters of about 20% from 18-40%.   CURRENT MEDICATIONS:  1. Cardizem 120.  2. Tikosyn 500 b.i.d.  3. Aspirin.  4. Claritin.  5. Lipitor.   PHYSICAL EXAMINATION:  LUNGS:  Clear.  CARDIAC:  His heart sounds were regular.  EXTREMITIES:  Without edema.   Interrogation of his Guidant pacemaker demonstrates a P-wave of 1.5 with  impedance of 300 and threshold of 0.5 at 0.4.  The R-wave was 3.4 which  is chronically low with impedance of 300 and threshold of 0.5 at 0.4.  The atrial fibrillation burden is as noted and he is atrial paced most  the rest of this time.   IMPRESSION:  1. Sinus node dysfunction.  2. Paroxysmal atrial fibrillation.  3. Obstructive sleep apnea closely related to paroxysmal atrial      fibrillation.   PLAN:  [name] is doing pretty well at this point with his sleep  apnea titrated.  We will plan to see him again in 3 months' time to see  if there is any decrease in the frequency of atrial  fibrillation.  We  again discussed the issue of catheter ablation and where it fits in the  symptom line.   The other issue was anticoagulation.  Coumadin has been discontinued  because of epistaxis and blood in the semen.  He was put on aspirin.  We  reviewed the CHADS data.  We reviewed  the [facility] data as well as the [facility] data and I estimate  that his risk of stroke off Coumadin and on aspirin is less than 0.5%  based on those data.   We will continue on his current course.     Duke Salvia, MD, [facility]  Electronically Signed    SCK/MedQ  DD: 07/02/2008  DT: 07/03/2008  Job #: [id number]

## 2011-01-24 NOTE — Procedures (Signed)
BYPASS GRAFT EVALUATION   INDICATION:  Followup evaluation of lower extremity bypass graft.   HISTORY:  Diabetes:  No.  Cardiac:  Pacemaker in 11/2000.  Hypertension:  No.  Smoking:  No.  Previous Surgery:  Right distal superficial femoral to below-knee pop  artery bypass graft with resection of right popliteal aneurysm and  thrombectomy of right tibial arteries on 08/17/2002.   Left above-knee pop to below-knee pop bypass graft with saphenous vein  on 02/19/2002.  Both of these surgeries were done by Dr. Madilyn Fireman.   SINGLE LEVEL ARTERIAL EXAM                               RIGHT              LEFT  Brachial:                    108                108  Anterior tibial:             90                 120  Posterior tibial:            118                146  Peroneal:  Ankle/brachial index:        >1                 >1   PREVIOUS ABI:  Date:  12/27/2006  RIGHT:  >1  LEFT:  >1   LOWER EXTREMITY BYPASS GRAFT DUPLEX EXAM:   DUPLEX:  Doppler arterial waveforms are triphasic proximal to within and  distal to the bypass grafts bilaterally.   IMPRESSION:  Ankle-brachial indices are stable from previous studies  bilaterally.   Patent right distal superficial femoral to below-knee popliteal artery  bypass graft.   Patent left above-knee to below-knee popliteal bypass graft.   ___________________________________________  P. Liliane Bade, M.D.   MC/MEDQ  D:  08/16/2007  T:  08/17/2007  Job:  [id number]

## 2011-01-24 NOTE — Assessment & Plan Note (Signed)
[facility]                               LIPID CLINIC NOTE   NAME:[name], [name]                      MRN:          [id number]  DATE:01/24/2007                            DOB:          September 10, 1941    Return office visit for lipid clinic.   PAST MEDICAL HISTORY:  Atrial fibrillation, hyperlipidemia, coronary  artery disease, peripheral vascular disease, insomnia and depression and  seasonal allergies.   MEDICATIONS:  1. Claritin 5 mg daily.  2. Lipitor 40 mg daily.  3. Tricor 145 mg daily.  4. Aspirin 162 mg daily.  5. Potassium 10 mEq daily.  6. Diltiazem 120 mg daily.  7. Coumadin as directed.  8. Tikosyn 500 mg twice daily.  9. Fish oil 1 tablet twice daily.   VITAL SIGNS:  Weight 202 pounds, blood pressure 180/60, heart rate 60.   LABORATORY DATA:  Total cholesterol 118, triglycerides 40, HDL 35, LDL  75, LFTs within normal limits.   ASSESSMENT:  [name] is a pleasant gentleman who returns to the  lipid clinic today with no chest pain, no shortness of breath, no muscle  aches or pains. He is compliant with current medication regimen. He is  however very noncompliant with his exercise regimen. He had previously  been exercising by walking 30 minutes 3-5 times a week during his lunch  break and going to the gym and exercising on the weekends for  approximately an hour using weights and walking on the treadmill. Over  the last 2 months, he has been busy both at work and in his home life  and is therefore only exercised approximately 2 days. He has increased  the amounts of fats and carbohydrates in his diet over the last 2 months  also. On a normal basis, he eats a fairly heart healthy diet, oatmeal  for breakfast, a very lean lunch with salad and grilled chicken, very  small amount of low fat dressing. His evening meal is low carbohydrate,  low fat and lots of fruits and vegetables. However, given his busy  schedule for the last  several months, he has increased the amount of  fried food and pastas and fast food that he has eaten and is reflective  in his lipid panel. His total cholesterol still at goal of less than  200, triglycerides at goal of less than 150. HDL is less than goal is  greater than 40 and LDL is greater than goal of less than 70.   PLAN:  1. [name] says that he will restart his exercise regimen. He said      he is very dedicated to this and will resume his 3 times a week      walking in the afternoons and his exercise on the weekends at the      gym.  2. Will decrease carbohydrates and fat in diet.  3. Continue current medication regimen with the understanding that if      his LDL is not at goal of less than 70 at next visit and his HDL  improving that he will increase his Lipitor to 80 mg daily.  4. Followup visit in 3 months for lipid panel and LFTs and will make      adjustments that will be needed at that time.      Leota Sauers, PharmD       Jesse Sans. Daleen Squibb, MD, [facility]    LC/MedQ  DD: 01/24/2007  DT: 01/24/2007  Job #: [id number]

## 2011-01-24 NOTE — Assessment & Plan Note (Signed)
[name]                         ELECTROPHYSIOLOGY OFFICE NOTE   NAME:SKEEHANTaro, [name]                      MRN:          [id number]  DATE:09/30/2008                            DOB:          1941-03-02    [name] was seen in followup for atrial fibrillation which has been  correlated with obstructive sleep apnea.  Since we saw him last, he has  had a sleep mask adjusted with increase in his pressure, this has been  associated with significant improvement in his palpitations.   He has also come off the Coumadin because of epistaxis and his bleeding  has been only scant.   CURRENT MEDICATIONS:  1. Tikosyn 500 b.i.d.  2. Cardizem.  3. K-Dur.  4. Lipitor.  5. Claritin.  6. He also takes in fibroid and aspirin 325.   PHYSICAL EXAMINATION:  VITAL SIGNS:  His blood pressure today was 106/70  with the pulse of 68, his weight was 209, which is stable over the last  6 months and up about 10 pounds in the last year and half.  NECK:  Neck veins were flat.  LUNGS:  Clear.  HEART:  Sounds were regular.  EXTREMITIES:  Without edema.   Interrogation of his Guidant paced pulse generator demonstrates that his  atrial fibrillation now only about 20% of the time down from about 40.   IMPRESSION:  1. Paroxysmal atrial fibrillation - minimally symptomatic.  2. Obstructive sleep apnea correlating with both symptoms at frequency      of,  3. With improvement since up titration of his mask, free Tikosyn      therapy for paroxysmal atrial fibrillation with a QT interval on      his ECG in October of 408 milliseconds.  4. Potassium and magnesium supplementation.   [name] is doing quite well.  At this point, we will see him again  in 4 months time to reassess the frequency of his atrial fibrillation.     [name] Salvia, MD, [facility]  Electronically Signed   SCK/MedQ  DD: 09/30/2008  DT: 09/30/2008  Job #: [id number]

## 2011-01-24 NOTE — Assessment & Plan Note (Signed)
[facility]                            CARDIOLOGY OFFICE NOTE   NAME:[name], [name] BOOZER                      MRN:          [id number]  DATE:05/31/2007                            DOB:          [birth year]    REFERRING PHYSICIAN:  Adolph Pollack, M.D.   PRIMARY CARE PHYSICIAN:  Dr. Oliver Barre   REASON FOR PRESENTATION:  A patient with atrial fibrillation and  coronary disease.  He is due to have inguinal hernia repair.   HISTORY OF PRESENT ILLNESS:  The patient is a pleasant 70 year old  gentleman with medical problems listed below.  He is due to have  inguinal hernia repair by Dr. Abbey Chatters.  Since I last saw him, he has  done extremely well.  He exercises.  He does  not have any chest  discomfort, neck or arm discomfort.  He has had no palpitations,  presyncope or syncope.  He has had no PND or orthopnea.  His chest  perfusion study was in 2005 demonstrating a well preserved ejection  fraction and no evidence of high-grade defect.  He has had his pacemaker  followed by Dr. Graciela Husbands and it is working appropriately.   PAST MEDICAL HISTORY:  Coronary artery disease (catheterization March  2004, left main normal, LAD proximal 99% stenosis, mid 40% stenosis,  first diagonal and large second diagonal with luminal irregularities,  circumflex approximately 25% stenosis, right coronary artery 25-35% mid  stenosis.  His EF 55%.  He had stenting of at least 2 separate stents  with one of them being a Taxus stent), paroxysmal atrial fibrillation  with bradycardia status post permanent pacemaker placement,  dyslipidemia, insomnia, depression, peripheral vascular disease status  post right fem-pop bypass and left fem-pop bypass (previously reduced  ejection fraction though he is now at 55%).   ALLERGIES:  NIACIN, ACE INHIBITOR, AMIODARONE caused neuropathy,  FLECAINIDE caused erectile dysfunction.   MEDICATIONS:  1. Claritin 5 mg daily.  2. Lipitor 40 mg  daily.  3. Aspirin 162 mg daily.  4. K-Dur 10 mEq daily.  5. Cardizem 120 mg daily.  6. Tricor 145 mg daily.  7. Coumadin.  8. Tikosyn 500 mg b.i.d.  9. Nasacort  10.Fish oil.   REVIEW OF SYSTEMS:  As stated in the HPI and otherwise negative for  other systems.   PHYSICAL EXAMINATION:  The patient is in no distress.  Blood pressure  91/60, heart rate 60 and regular, weight 200 pounds, body mass index 24.  HEENT:  Eyes unremarkable, pupils equal, round and reactive to light,  fundi not visualized, oral mucosa unremarkable.  NECK:  No jugular venous distention at 45 degrees, carotid upstrokes  brisk and symmetric, no bruits, thyromegaly.  LYMPHATICS:  No cervical, axillary, inguinal adenopathy.  LUNGS:  Clear to auscultation bilaterally.  BACK:  No costovertebral angle tenderness.  CHEST:  Unremarkable except for a well-healed pacemaker pocket.  HEART:  PMI not displaced or sustained, S1 and S2 within normal limits,  no S3, S4, clicks, rubs, murmurs.  ABDOMEN:  Flat, positive bowel sounds, normal in frequency, pitch, no  bruits, rebound, guarding,  no midline pulsatile mass, hepatomegaly,  splenomegaly.  SKIN:  No rashes.  EXTREMITIES:  Two plus pulses, no edema, no cyanosis, clubbing.  NEURO:  Oriented to person, place and time, Cranial nerves II-XII  grossly intact, motor grossly intact.   ASSESSMENT AND PLAN:  1. Preoperative clearance.  The patient is at acceptable risk for the      planned procedure.  This is a moderate risk procedure at best.  He      has no symptoms.  He has had a stress perfusion study within 3      years.  No further cardiovascular testing is suggested.  He can      continue with the medications listed with the exception described      below.  2. Atrial fibrillation.  The patient is at low risk for coming off of      his Coumadin without bridging Lovenox.  He can stop this 5 days      beforehand.  I would like him to continue his aspirin if there is       not a prohibited surgical risk.  I would see a need for him to stay      overnight in the hospital.  The surgical site is remote from his      pacemaker.  There should be no interference.  If there is, he can      have a magnet placed on it to convert it to asynchronous mode.  He      can have the pacemaker interrogated postoperatively.  This is a      Guidant device and the Guidant folks can be notified.  He will stop      his Coumadin  5 days before surgery.  He should start it as soon as      possible after surgery.  3. Risk reduction.  The patient had a reasonable lipid profile.  His      HDL was slightly low recently.  He has increased his fish oil      consumption.  He will remain on the other medications as listed.  4. Followup:  I will see him back in one year.  I will be available if      necessary at the time of his surgery. (Beeper No. [phone]).     Rollene Rotunda, MD, [facility]  Electronically Signed    JH/MedQ  DD: 05/31/2007  DT: 05/31/2007  Job #: [id number]   cc:   Adolph Pollack, M.D.

## 2011-01-24 NOTE — Assessment & Plan Note (Signed)
[facility]                               LIPID CLINIC NOTE   NAME:[name], [name]                      MRN:          [id number]  DATE:10/05/2008                            DOB:          April 17, 1941    The patient is seen back in Lipid Clinic for further evaluation and  medication titration associated with his hyperlipidemia in the setting  of documented coronary disease.  [name] has been compliant with his  Lipitor 40 mg daily, his fenofibrate 160 mg daily, and his fish oil 4 g  daily.  Upon dietary review, he is eating almost no fat and no  cholesterol-laden foods on a regular basis.  He has been complaint with  his therapy and is tolerating it well.  He does state that he has  discontinued exercise, participating in less than or equal to 1 day per  week of 15 minutes' activity.  It is basically unchanged from his July  2009 note.   PAST MEDICAL HISTORY:  Pertinent for documented CAD, paroxysmal atrial  fibrillation status post pacemaker placement.   PHYSICAL EXAMINATION:  Weight today is 212 pounds, blood pressure is  118/70, and heart rate is 62.   LABORATORY DATA:  On October 01, 2008, revealed total cholesterol 110,  triglycerides 43, HDL 35.7, and LDL 66.  ALT and AST are within normal  limits.   ASSESSMENT:  This patient is feeling and doing well overall, his labs  are at goal.   PLAN:  He will continue his current therapies.  He will work to restart  his exercise.  He will continue his current medications and follow up  with [place] in 6 months.  I appreciate the opportunity to see this pleasant  patient.  Please note the patient visit was conducted by Dr. Weston Brass,  Pharm.D.      Shelby Dubin, PharmD, BCPS, CPP  Electronically Signed      Rollene Rotunda, MD, [facility]  Electronically Signed   MP/MedQ  DD: 10/05/2008  DT: 10/06/2008  Job #: [id number]   cc:   Rollene Rotunda, MD, [facility]

## 2011-01-24 NOTE — Letter (Signed)
May 09, 2007    Adolph Pollack, M.D.  [address], [address]  [place], [place] [address]   RE:  [name], [name]  MRN:  [id number]  /  DOB:  19-Feb-1941   Dear Tawanna Cooler:   [name] is a patient of ours.  I have known him for a long time  for paroxysmal atrial fibrillation which at least in part seems to be  related to sleep apnea.  He also has ischemic heart disease with a prior  stent, drug-eluting and non-drug-eluting, placed in his LAD in 2004 and  for which he has had no recurrent symptoms of chest pain or shortness of  breath.  He apparently has developed an inguinal hernia and is seeing  you today.  He has had rare episodes of paroxysmal atrial fibrillation.  He does take Coumadin.  He also takes Tikosyn 500 b.i.d., aspirin,  Cardizem 120, Tricor, Claritin, Lipitor.   PHYSICAL EXAMINATION:  VITAL SIGNS:  His blood pressure today was  114/69, pulse 59.  LUNGS:  Clear.  HEART:  Heart sounds were regular.  EXTREMITIES:  Without edema.   Interrogation of his Guidant pulse generator demonstrates a P wave of  1.5 with impedance of 360, a threshold of 0.5 at 0.4.  The R wave was  3.2 with impedance of 300, threshold of 0.9 at 0.4.  Battery voltage  demonstrates about 75% longevity.  He is 86% atrial paced and 32%  ventricularly paced with a AV delay of 250.   IMPRESSION:  1. Paroxysmal atrial fibrillation.  2. Bradycardia.  3. Status post pacer for #2 and Tikosyn for #1.  4. Ischemic heart disease with (a) normal left ventricular function,      (b) prior left anterior descending percutaneous coronary      intervention.   [name], [name], is doing fine.  From a surgical point of view, he  should be at acceptable risk for surgery.  Please do not hesitate to let  us know about the time of his procedure, and we will be glad to follow  along.    Sincerely,      Duke Salvia, MD, [facility]  Electronically Signed    SCK/MedQ  DD: 05/09/2007  DT: 05/09/2007  Job  #: [id number]

## 2011-01-24 NOTE — Assessment & Plan Note (Signed)
[facility]                               LIPID CLINIC NOTE   NAME:[name], [name]                      MRN:          [id number]  DATE:05/02/2007                            DOB:          08-Jun-1941    PAST MEDICAL HISTORY:  Hyperlipidemia, atrial fibrillation, coronary  artery disease, peripheral vascular disease, insomnia, depression,  seasonal allergies.   MEDICATIONS:  1. Claritin 5 mg daily.  2. Aspirin 162 mg daily.  3. Potassium 10 mEq daily.  4. Diltiazem 120 mg daily.  5. Tricor 145 mg daily.  6. Coumadin as directed.  7. Tikosyn 500 mg twice daily.  8. Nasacort 2 puffs daily.  9. Fish oil 1 gram twice daily.   VITAL SIGNS: Weight 202 pounds, blood pressure 110/76, heart rate 60.   LABORATORY DATA:  Total cholesterol 105, triglycerides 40, HDL 34, LDL  63. LFTs within normal limits.   ASSESSMENT:  Mr. Mcnairy is a very pleasant gentleman who returns to  lipid clinic with no chest pain, no shortness of breath, no muscle aches  or pains. He is compliant with current medication regimen. He is also  compliant with a very low fat diet. He avoids fried foods. He cooks with  small amounts of olive oil. He eats high amount of vegetables on a daily  basis and low carbohydrates. He exercises 30 minutes a day and is very  disappointed in his drop in HDL. Since he had restarted his exercise  regimen he was hoping that it would be higher. His total cholesterol is  at goal of less than 200, triglycerides at goal of less than 150, HDL  less than goal of greater than 40, and LDL of goal of less than 70.   PLAN:  1. Increase fish oil to 2 tablets twice daily. Continue current statin      therapy.  2. Follow up visit in 6 months for lipid and LFTs and make adjustments      from there. Continue      exercise regimen.  3. Continue lifestyle modification heart healthy diet.      Leota Sauers, PharmD  Electronically Signed      Jesse Sans.  Daleen Squibb, MD, [facility]  Electronically Signed   LC/MedQ  DD: 05/02/2007  DT: 05/03/2007  Job #: [id number]

## 2011-01-24 NOTE — Assessment & Plan Note (Signed)
[facility]                               LIPID CLINIC NOTE   NAME:[name], [name]                      MRN:          [id number]  DATE:04/06/2008                            DOB:          1941-07-02    [name] is seen back in the Lipid Clinic for further evaluation and  medication titration associated with his hyperlipidemia in the setting  of documented coronary disease.  [name] states that he has recently  been evaluated by Dr. Shelle [name] who is following up on his CPAP pressure.  He is noticing more episodic increased heart rate irregularity.  He has  been feeling and doing okay.  He has continued his exercise regimen, but  at a much lesser level of activity, participating only may be one day a  week for 10-15 minutes, which is a big change.  He has also had some  dietary indiscretion as well.   The patient's past medical history is pertinent for documented CAD,  paroxysmal atrial fibrillation with bradycardia, status post permanent  pacemaker placement, dyslipidemia, insomnia, depression, and peripheral  vascular disease.   Drug allergies include NIACIN, ACE INHIBITORS, AMIODARONE, AND  FLECAINIDE.   Current medication include,  1. Claritin 10 mg daily.  2. Lipitor 40 mg daily.  3. K-Dur 10 mEq daily.  4. Cardizem 120 mg daily.  5. Tricor 145 mg daily.  6. Tikosyn 500 mcg twice daily.  7. Fish oil 6 capsules daily.  8. Aspirin 325 mg daily.   REVIEW OF SYSTEMS:  As stated in the HPI and otherwise negative.   PHYSICAL EXAMINATION:  Weight today is 204-3/4 pounds, blood pressure is  110/68, heart rate is 60, respirations are 16.   Labs in July 2009 revealed total cholesterol of 109, triglycerides 34,  HDL 38.5, LDL 64.   ASSESSMENT:  The patient has had minimal changes in his labs.  We will  plan the following,  1. The patient will continue his Lipitor 40 mg daily.  We will change      his fenofibrate over to 160 mg generic  formulation daily.  The      patient will decrease his fish oil to 4 g daily.  He will call if      questions or problems in the meantime.  He will continue to work to      increase his diet compliance as well as his exercise adherence.  We      will see him back in 6 months.  He will      call with questions or problems in the meantime, and I have      provided encouragement to him for continuing to move forward with      his physical activity.      Shelby Dubin, PharmD, BCPS, CPP  Electronically Signed      Rollene Rotunda, MD, [facility]  Electronically Signed   MP/MedQ  DD: 04/07/2008  DT: 04/08/2008  Job #: [id number]   cc:   Barbaraann Share, MD,FCCP

## 2011-01-24 NOTE — Assessment & Plan Note (Signed)
[facility]                            CARDIOLOGY OFFICE NOTE   NAME:[name], [name]                      MRN:          [id number]  DATE:02/21/2008                            DOB:          06-18-1941    REASON FOR PRESENTATION:  Evaluate patient with atrial fibrillation and  coronary disease.   HISTORY OF PRESENT ILLNESS:  The patient presents for followup of the  above.  Since I last saw him, he has had problems with nose bleeding.  He has actually had to have cauterization by Dr. Jearld [name].  He actually  took himself off his Coumadin for a while because of this.  He is back  on it.  He has not had any other cardiovascular symptoms.  He is not  noticing any palpitations, presyncope or syncope.  He is not having any  PND or orthopnea.  He has had no chest pressure, neck or arm discomfort.  He stays quite active.  He has had his pacemaker followed by Dr. Graciela [name].   PAST MEDICAL HISTORY:  1. Coronary artery disease (catheterization March 2004 with a left      main normal, LAD proximal 99% stenosis, mid 40% stenosis, first      diagonal and large second diagonal with luminal irregularities,      circumflex approximately 25% stenosis, right coronary artery 25-35%      mid stenosis, EF 55%.  He had stenting of a least two separate      stents with one of them being a Taxus stent.)  Paroxysmal atrial      fibrillation with bradycardia status post  permanent pacemaker      placement.  2. Dyslipidemia.  3. Insomnia.  4. Depression.  5. Peripheral vascular disease status post right fem-pop bypass and      left fem-pop bypass (previously reduced ejection fraction, though      he is now at 55%.)   ALLERGIES/INTOLERANCES:  1. NIACIN.  2. ACE INHIBITOR.  3. AMIODARONE caused neuropathy.  4. FLECAINIDE caused erectile dysfunction.   MEDICATIONS:  1. Claritin 10 mg daily.  2. Lipitor 40 mg daily.  3. K-Dur 10 mEq daily.  4. Cardizem 120 mg daily.  5. Tricor  145 mg daily.  6. Coumadin.  7. Tikosyn 500 mg b.i.d.  8. Fish oil.  9. Aspirin 81 mg daily.   REVIEW OF SYSTEMS:  As stated in the HPI and otherwise negative for  other systems.   PHYSICAL EXAMINATION:  GENERAL:  The patient is pleasant and in no  distress.  VITAL SIGNS:  Blood pressure 106/60, heart 60 and regular, weight 208  pounds, body mass index 24.  HEENT:  Eyes are unremarkable.  Pupils are equal and reactive to light.  Fundi not visualized, oral mucosa unremarkable.  NECK:  No jugular venous distention at 45 degrees, carotid upstroke  brisk and symmetric, no bruits, no thyromegaly.  LYMPHATICS:  No cervical, axillary or inguinal adenopathy.  LUNGS:  Clear to auscultation bilaterally.  BACK:  No costovertebral angle tenderness.  CHEST:  Well healed ICD pocket.  HEART:  PMI not  displaced or sustained, S1-S2 within normal limits.  No  S3, no S4, no clicks, rubs, murmurs.  ABDOMEN:  Flat, positive bowel sounds normal in frequency and pitch, no  bruits, rebound, guarding.  No midline pulsatile mass, no hepatomegaly,  no splenomegaly.  SKIN:  No rashes, no nodules.  EXTREMITIES:  2+ pulses throughout, no edema, cyanosis or clubbing.  NEURO:  Oriented to person, place, time.  Cranial nerves II-XII grossly  intact, motor grossly intact.   DIAGNOSTICS:  EKG:  Sinus rhythm, AV atrial paced rhythm, rate 60, axis  within normal.  Intervals within normal limits.  No acute ST-wave  changes, normal magnet function.   ASSESSMENT/PLAN:  1. Atrial fibrillation.  We had a long conversation about [place] score      and risk of thromboembolism.  He does have paroxysms of atrial      fibrillation.  These are asymptomatic.  At this point, his [place]      score is really 1 with coronary disease.  His  ejection fraction is      normal by the last measure.  There are no other high risk findings.      Therefore, there is no absolute indication for Coumadin.  He is      having a relative  contraindication with the problematic nosebleeds.      Therefore, he will come off the Coumadin and start a full-dose      aspirin.  He knows that we will discuss this routinely at visits to      understand whether and when he needs to go back on anticoagulation.      He will remain on Tikosyn for rhythm control.  His QT interval is      slightly prolonged, but within acceptable limits and comparable to      previous readings dating back 2007.  2. Coronary artery disease.  He is having no ongoing symptoms.  No      further cardiovascular testing is suggested.  He will continue with      aggressive risk reduction.  3. Dyslipidemia per Dr. Jonny [name].  The goal being LDL less than 100 and      HDL greater than 40.  4. Erectile dysfunction.  He is seeing an urologist for this.  He will      have followup soon.  5. Peripheral vascular disease.  He has been followed in the past by      the vascular surgeons, but is having no further symptoms.  We will      continue with risk reduction.   FOLLOW UP:  I will see him back in 6 months to re-discuss Coumadin and  his arrhythmia.  He will follow in our pacemaker clinic.     Rollene Rotunda, MD, [facility]  Electronically Signed    JH/MedQ  DD: 02/21/2008  DT: 02/21/2008  Job #: [id number]   cc:   [name] Levins, MD

## 2011-01-27 NOTE — Op Note (Signed)
[place]  Patient:    [name], [name] Visit Number: [id number] MRN: [id number]          Service Type: MED Location: [id number] Attending Physician:  Caralee Ates Dictated by:   Denman George, M.D. Proc. Date: 10/23/01 Admit Date:  10/18/2001                             Operative Report  PREOPERATIVE DIAGNOSES: 1. 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 Recurrent right tibial artery thrombosis.  POSTOPERATIVE DIAGNOSES: 1. 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 Recurrent right tibial artery thrombosis.  PROCEDURES: 1. Right tibial artery thrombectomy. 2. Intraoperative urokinase infusion. 3. Intraoperative arteriogram.  SURGEON:  P. Bud Face, M.D.  ASSISTANTS:  Caralee Ates, M.D., and Tollie Pizza. Thomasena Edis, P.A.-C.  ANESTHESIA:  General endotracheal.  ANESTHESIOLOGIST:  Burna Forts, M.D.  CLINICAL NOTE:  This is a 70 year old male who initially presented to [facility] approximately five days ago with acute symptoms of right lower extremity ischemia.  Arteriography revealed a right popliteal artery aneurysm with evidence of atheroembolization into the tibial vessels.  He was taken to the operating room on October 18, 2001, and underwent repair of right popliteal artery aneurysm with saphenous vein bypass and tibial artery thromboembolectomy.  Over the past several days the patient has continued to complain of pain, and he underwent a repeat arteriography, which revealed thrombosis of the anterior and posterior tibial arteries.  He is brought back to the operating room at this time for re-exploration.  DESCRIPTION OF PROCEDURE:  Patient brought to the operating room in stable condition.  Placed in the supine position.  General endotracheal anesthesia induced.  Right leg prepped and draped in a sterile fashion.  The popliteal fossa  was re-entered through the right medial calf incision. Subcutaneous sutures were incised and fascial sutures incised.  The popliteal fossa entered.  The recent vein graft bypass was easily identified and the distal anastomosis exposed.  The vein graft, proximal popliteal artery, and each of the tibial arteries were encircled with vessel loops.  The patient then administered 7000 units of heparin intravenously.  Subsequent 3000 units also administered intravenously.  The vein graft and the tibial vessels were all controlled with clamps.  The distal anastomosis taken down.  A 3 Fogarty catheter was then passed down both the anterior and posterior tibial arteries and several passes made, with return of extensive subacute thrombus.  The Fogarty could be passed the length of the calf down to the level of the ankle.  After no further thrombus was returned, each vessel was irrigated with heparin-saline solution and papaverine.  The distal vein graft anastomosis was then refashioned in an end-to-side configuration to the distal popliteal artery using running 6-0 Prolene suture. The vein graft was then flushed and clamps were removed.  Excellent flow present through the graft.  An intraoperative infusion of 100,000 units of urokinase was then made down through the vein graft into the tibial vessels.  This was carried out over approximately 30 minutes.  Following this, an on-table arteriogram was obtained.  This revealed the peroneal artery to be patent.  The anterior tibial and posterior tibial arteries were patent down to the midcalf and then appeared to be in intense spasm.  The arteries were again flushed with heparinized saline with papaverine.  Good audible Doppler signals were present  throughout the three tibial vessels at the ankle.  The right foot was well-perfused.  Heparin was not reversed.  The popliteal fossa was drained with a 15 round Blake drain exited medially through the  skin.  This was fixed to the skin with a 2-0 silk suture.  The fascial layer was then closed with interrupted 2-0 Vicryl suture.  The subcutaneous layer closed with running 3-0 Vicryl suture.  The skin closed with 4-0 Monocryl in a subcuticular running fashion.  Half-inch Steri-Strips applied.  The patient tolerated the procedure well.  No apparent intraoperative complications.  Transferred to the recovery room in stable condition.  Heparin infusion will be begun postoperatively and the patient closely monitored. Dictated by:   Denman George, M.D. Attending Physician:  Caralee Ates. DD:  10/23/01 TD:  10/24/01 Job: [id number]

## 2011-01-27 NOTE — Discharge Summary (Signed)
[place]  Patient:    RODRECUS, BELSKY Visit Number: [id number] MRN: [id number]          Service Type: MED Location: 2000 [id number] Attending Physician:  Caralee Ates Dictated by:   Tollie Pizza Collins, P.A.-C. Admit Date:  10/18/2001 Disc. Date: 10/22/01   CC:         [name], M.D. [facility]  Nathen May, M.D., [address]   Discharge Summary  PRIMARY ADMITTING DIAGNOSES: 1. Right lower extremity ischemia. 2. Right popliteal artery aneurysm. 3. Abdominal aortic aneurysm.  ADDITIONAL DIAGNOSES: 1. History of sick sinus syndrome status post placement of permanent    pacemaker by Dr. Graciela Husbands. 2. History of depression. 3. History of left knee injury status post left knee arthroscopy.  PROCEDURES PERFORMED: 1. Aortogram with bilateral lower extremity runoff. 2. Repair of right popliteal aneurysm. 3. Right femoral to popliteal bypass graft with reverse saphenous vein. 4. Right tibial thrombectomy. 5. Intraoperative arteriogram.  HISTORY:  The patient is a 70 year old white male who presented to the ER wit acute onset of right foot pain, numbness, and coolness which had progressed from his calf which started the previous evening.  He has a history of intermittent right calf claudication walking on the treadmill.  In the ER, he was noted to have right lower extremity ischemia and a CVTS consult was obtained.  HOSPITAL COURSE:  He was seen by Dr. Elyn Peers and admitted and started on heparin. He underwent an arteriogram on 2/7 which showed a right popliteal artery aneurysm with distal ischemia.  He was taken to the operating room later that day where he underwent the above noted procedures.  Postoperatively, he has done well.  He was transferred to the step-down unit in stable condition and after 48 hours was transferred to the floor.  He has had good posterior tibial and anterior tibial dorsalis pedis Doppler signals.  His incisions are  healing well.  He has remained afebrile and all vital signs have been stable.  He has been ambulating without difficulty.  He is scheduled for ABIs with the vascular lab today and if everything remains stable and his ABIs are improved, he will be ready for discharge home in the morning on 10/22/01.  DISCHARGE INSTRUCTIONS:  He is to refrain from driving, heavy lifting, or strenuous activity.  He should continue daily walking and incentive spirometry exercises.  He is asked to continue his same preoperative diet.  He may shower daily and clean his incisions with soap and water.  He is asked to follow up in the CVTS office in one week for staple removal and an appointment will be made at that time for him to see Dr. Elyn Peers in three weeks with ABIs. Dictated by:   Tollie Pizza Collins, P.A.-C. Attending Physician:  Caralee Ates. DD:  06/19/02 TD:  10/21/01 Job: [id number]

## 2011-01-27 NOTE — Cardiovascular Report (Signed)
NAME:  [name], [name]                         ACCOUNT NO.:  [id number]   MEDICAL RECORD NO.:  [id number]                   PATIENT TYPE:  OIB   LOCATION:  2021                                 FACILITY:  [facility]   PHYSICIAN:  Veneda Melter, M.D. [facility]               DATE OF BIRTH:  [birth year]   DATE OF PROCEDURE:  11/28/2002  DATE OF DISCHARGE:                              CARDIAC CATHETERIZATION   PROCEDURES PERFORMED:  Percutaneous transluminal coronary angioplasty with  stent placement to the proximal left anterior descending.   DIAGNOSES:  1. Two-vessel coronary artery disease.  2. Unstable angina.   HISTORY:  The patient is a 70 year old white male who presents with  crescendo angina.  The patient was referred for coronary angiography which  was performed by Dr. Rollene Rotunda, showing 2-vessel coronary artery  disease with critical narrowing of 90% to 95% in the proximal LAD with  haziness and moderate disease of the right coronary artery.  He appears to  have well-preserved LV function.  He is referred for percutaneous  intervention to the proximal LAD.   TECHNIQUE:  Informed consent had been obtained.  The existing 6-French  sheath in the right groin was utilized.  He was given Angiomax on a weight-  adjusted basis, and 600 mg of Plavix orally.  A 6-French CLF 2.5 guide  catheter was used to engage the left coronary artery and a 0.014-inch Forte  wire introduced.  This was used to size the proximal LAD lesion.  A 3.5 x 8  mm Taxus stent was then introduced, carefully positioned in the proximal  LAD, and deployed at 12 lesions for 45 seconds.  Repeat angiography showed  significant improvement in vessel lumen. There was moderate residual waste  in the proximal segment of the stent, approximately 30% with slight haziness  in the vessel wall.  This was suggestive of dissection.  A 3.5 x 8 mm  Quantum Maverick balloon was used to post dilate the Taxus stent.  Two  inflations were  performed at 12 and 14 atmospheres, 30 seconds.  A 3.5 x 8  mm Express II stent was then positioned just proximal to the previously  placed Taxus stent with overlap and deployed at 12 atmospheres for 30  seconds.  The Quantum Maverick balloon was then reintroduced and used to  post dilate the Express II stent with a single inflation at 16 atmospheres  for 30 seconds.  Repeat angiography was then performed after the  administration of intracoronary nitroglycerin and verapamil showing  excellent results with no residual stenosis, full coverage of the lesion,  and TIMI-3 flow through the LAD.  The patient did have chest discomfort with  balloon and stent inflations with ST elevation that was resolving after  deflation.  He remained hemodynamically stable through the case.  The  sheaths were then secured in position.  The patient was transferred to the  holding  area in stable condition.  The sheaths will be removed when the ACT  returns to normal.   FINAL RESULTS:  Successful percutaneous transluminal coronary angioplasty  and stent placement to the proximal left anterior descending with reduction  of 95% hazy narrowing to zero percent with placement of a 3.5 x 8 mm Taxus  II stent via the first diagonal branch preceded by a 3.5 x 8 mm Express II  stent.    ASSESSMENT AND PLAN:  The patient is a 70 year old gentleman with 2-vessel  coronary artery disease.  He will be continued on Plavix for at least 6  months' time.  Clinical assessment of the right coronary artery will be made  and consideration given towards stress imaging studies.  The patient would  also benefit from high-dose statin therapy.                                               Veneda Melter, M.D. [facility]    NG/MEDQ  D:  11/28/2002  T:  11/29/2002  Job:  [id number]   cc:   [name], M.D. [facility]   Pricilla Riffle, M.D. [facility]

## 2011-01-27 NOTE — Op Note (Signed)
NAME:  [name], [name]                         ACCOUNT NO.:  [id number]   MEDICAL RECORD NO.:  [id number]                   PATIENT TYPE:  OIB   LOCATION:  2870                                 FACILITY:  [facility]   PHYSICIAN:  Duke Salvia, M.D.               DATE OF BIRTH:  03/24/1941   DATE OF PROCEDURE:  11/09/2003  DATE OF DISCHARGE:                                 OPERATIVE REPORT   PREOPERATIVE DIAGNOSIS:  Sinus node dysfunction with prior pacemaker  implantation.   POSTOPERATIVE DIAGNOSIS:  Sinus node dysfunction with prior pacemaker  implantation.   SURGEON:  Duke Salvia, M.D.   PROCEDURE:  Explantation of a previously implanted device, now at end of  life, and implantation of new dual-chamber pacemaker.   DESCRIPTION OF PROCEDURE:  Following the attainment of informed consent, the  patient was brought to the catheterization laboratory and placed on the  fluoroscopy table in a supine position.  After routine prep and drape of the  left upper chest, lidocaine was infiltrated alone one of the previous  incisions and carried down the layer of the pacemaker pocket using sharp  dissection.  The pocket was opened, the device was freed up from the scar  tissue and the device was explanted.   Interrogation of the previously implanted leads demonstrated device bipolar  R wave was 4.3 mV with impedance of 329 ohms and a pacing threshold at 0.6 V  at 0.5 msec.  The currented threshold was 1.8 mA.  The bipolar P wave was 2  mV with a pacing impedance of 377 ohms and a pacing threshold of 0.4 msec at  0.5 msec and a currented threshold was 1.1 mA.  With these acceptable  parameters recorded, the system was implanted.  A Guidant Insignia Plus DR  pulse generator, model 1297, serial number [id number], was attached to the leads  with ventricular pacing and then atrial pacing identified.  The pocket was  copiously irrigated with antibiotic-containing saline solution and  hemostasis was  assured and the leads and the pulse generator were then  placed in the pocket.  The wound was closed in 3 layers in the normal  fashion.  The wound was washed, dried and a Benzoin and Steri-Strip dressing  was applied.  Needle count, sponge count and instrument counts were correct  at the end of the procedure according to the staff.   The patient tolerated the procedure without apparent complication.                                               Duke Salvia, M.D.    SCK/MEDQ  D:  11/09/2003  T:  11/10/2003  Job:  [id number]   cc:   [facility]   [facility]

## 2011-01-27 NOTE — H&P (Signed)
NAMEDAYRON, ODLAND NO.:  [id number]   MEDICAL RECORD NO.:  [id number]                   PATIENT TYPE:   LOCATION:                                       FACILITY:   PHYSICIAN:  Olga Millers, M.D. [facility]            DATE OF BIRTH:  November 02, 1940   DATE OF ADMISSION:  07/28/2002  DATE OF DISCHARGE:                                HISTORY & PHYSICAL   HISTORY OF PRESENT ILLNESS:  The patient is a 70 year-old male with a past  medical history of sick sinus syndrome status post pacemaker placement in  [place].  He also has a history of atrial fibrillation, hyperlipidemia and  popliteal aneurysm status post repair by Dr. Madilyn Fireman.  He presents with  complaints of palpitations, weakness and question near syncope.  The patient  denies any history of exertional chest pain, dyspnea on exertion, orthopnea,  PND or recent syncope.  The patient was seen by the primary care physicians  on Friday and was noted to have a URI and sinusitis.  He was treated with  cefuroxime and Bromfed.  He also had a flu shot.  The patient took a Bromfed  this morning and subsequently had palpitations associated with weakness and  nausea for 30 minutes.  There was no chest pain, shortness of breath or  frank syncope.  He now is improved.  Because of these symptoms he presented  to the emergency room after a discussion of this with Dr. Graciela Husbands.   ALLERGIES:  He has no known drug allergies.   MEDICATIONS:  1. Betapace 80 mg p.o. b.i.d.  2. Aspirin 81 mg p.o. q.d.  3. Lipitor 10 mg p.o. q.d.  4. Occasional nasal decongestants.  5. Cefuroxime and Bromfed as described in the history of present illness.   FAMILY HISTORY:  Positive for coronary artery disease in his father.   SOCIAL HISTORY:  He does not smoke nor does he consume alcohol.   PAST MEDICAL HISTORY:  1. There is no diabetes mellitus or hypertension, but there is recently     diagnosed hyperlipidemia.  2. History of atrial  fibrillation and is status post pacemaker placement     secondary to sick sinus syndrome.  3. History of bilateral popliteal aneurysm surgery.   REVIEW OF SYMPTOMS:  HEENT:  He denies any headaches at present. He has had  recent sinusitis.  GENERAL:  There are no fevers or chills at present.  RESPIRATORY:  There is no productive cough or hemoptysis.  GI:  There is no  dysphagia, odynophagia, melena or hematochezia.  GU:  There is no dysuria or  hematuria. NEUROLOGIC:  There is no active seizure activity.  CARDIAC:  There is no orthopnea, PND or pedal edema.  The remainder of the review of  systems is negative.   PHYSICAL EXAMINATION:  GENERAL:  This shows a well developed, well nourished  male. He is in  no acute distress.  VITAL SIGNS:  Pulse is presently in the 60's.  SKIN:  Warm and dry.  HEENT:  Unremarkable with normal eyelids. His neck is supple with no  supraclavicular adenopathy.  There are no bruits noted.  There is no jugular  venous distention or thyromegaly noted.  CHEST:  Clear to auscultation with normal expansion.  CARDIOVASCULAR:  Regular rate and rhythm with a normal S1 and S2.  He has no  murmurs, rubs or gallops noted.  ABDOMEN:  Non-tender and non-distended with positive bowel sounds.  No  hepatosplenomegaly and no masses appreciated.  There is no abdominal bruit.  He has 2+ femoral pulses bilaterally and no bruits.  EXTREMITIES:  No edema.  I can palpate no cords.  He has a minor rash on his  tibias bilaterally.  His distal pulses are 1+ bilaterally, left greater than  right.  NEUROLOGIC:  Grossly intact.   His electrocardiogram shows a normal sinus rhythm but is A paced.  The axis  is normal.  The QT is normal and there are no ST changes.   DIAGNOSES:  1. Palpitations.  2. Weakness.  3. History of pacemaker placement.  4. History of atrial fibrillation, now in normal sinus rhythm.  5. Recent upper respiratory infection.  6. History of popliteal aneurysm  repair.  7. Hyperlipidemia.   PLAN:  The patient presents with complaints of palpitations, weakness and  nausea immediately following Bromfed use.  This certainly may have  contributed to his symptoms and they have now resolved. It is unclear  whether he has had any type of arrhythmia.  We will admit him to telemetry  and continue his present medications.  Of note, he is on Sotalol but there  is no evidence of QT prolongation.  We will also cycle enzymes although I  doubt ischemia.  We will ask for the pacemaker to be interrogated.  If his  telemetry and enzymes are unremarkable, then he can most likely be  discharged in the morning safely.                                                Olga Millers, M.D. [facility]    BC/MEDQ  D:  07/28/2002  T:  07/28/2002  Job:  [id number]

## 2011-01-27 NOTE — Op Note (Signed)
[place]. [facility]  Patient:    HERRON, FERO Visit Number: [id number] MRN: [id number]          Service Type: MED Location: 2000 2040 01 Attending Physician:  Caralee Ates Dictated by:   Denman George, M.D. Proc. Date: 10/18/01 Admit Date:  10/18/2001   CC:         [name], M.D. [facility]   Operative Report  PREOPERATIVE DIAGNOSES: 1. Right popliteal artery aneurysm. 2. Ischemic right foot.  POSTOPERATIVE DIAGNOSES: 1. Right popliteal artery aneurysm. 2. Ischemic right foot.  PROCEDURE: 1. Resection of right popliteal artery aneurysm. 2. Right superficial femoral popliteal bypass with reversed saphenous vein    graft. 3. Right tibial artery thromboembolectomy. 4. Intraoperative arteriogram.  SURGEON:  P. Bud Face, M.D.  ASSISTANT:  Larina Earthly, M.D. and Maxwell Marion, RNFA.  ANESTHESIA:  General endotracheal.  ANESTHESIOLOGIST:  Maren Beach, M.D.  CLINICAL NOTE:  This is a 70 year old male who presented to the emergency department in the early morning hours of October 18, 2001, with right lower extremity pain.  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  He was taken to the cath lab and underwent diagnostic arteriography.  This verified a large right popliteal artery aneurysm and occlusion of the tibial outflow vessels.  She is brought to the operating room at this time for exploration of the popliteal artery, thromboembolectomy in the tibial arteries, and bypass grafting.  DESCRIPTION OF PROCEDURE:  The patient was brought to the operating room in a stable hemodynamic condition.  Placed under general endotracheal anesthesia. The right leg was prepped and draped in a sterile fashion.  A longitudinal skin incision made along the posterior border of the right tibia over the popliteal fossa.  Dissection was carried down through the subcutaneous tissue. An adequate size and good quality  saphenous vein was identified.  Tributaries of the saphenous vein ligated with 3-0 silk and divided.  A second skin incision then made above the knee, and a subcutaneous tunnel created between the two incisions.  The saphenous vein exposed up to the mid thigh. Tributaries ligated with 3-0 silk and divided.  Attention was then placed on the superficial femoral artery.  Sartorius muscles reflected anteriorly.  The superficial femoral artery at the adductor canal was identified.  This was a good quality artery, although large in size. The artery was freed and encircled with a vessel loop.  Distal dissection then carried down to expose the proximal portion of the popliteal aneurysm. Tributaries of the aneurysm were ligated with 2-0 silk tie.  An umbilical tape was placed around the neck of the aneurysm and the aneurysm ligated proximally.  From the distal approach, the gastrocnemius fascia was taken down.  The distal popliteal artery exposed.  The popliteal aneurysm then opened longitudinally. Back bleeding vessels were controlled with figure-of-eight 4-0 Prolene suture. The aneurysm was ligated distally with an umbilical tape.  The popliteal artery then followed distally down to the origin of the anterior tibial, posterior tibial, and peroneal arteries.  There was an aberrant origin of the posterior tibial artery arising from the distal popliteal artery and the peroneal and anterior tibial arteries had a common trunk.  Each of the tibial vessels was encircled with a vessel loop.  The patient administered 7000 units of heparin intravenously.  A longitudinal arteriotomy made in the distal popliteal artery.  The tibial vessels were then each thrombectomized with 3 and 2 Fogarty catheters. Atheroembolic debris  was removed.  The most significant amount of atheroembolic debris removed from the anterior tibial and peroneal arteries.  After several passes with Fogartys, no further thrombus was  returned.  A bypass reconstruction was then carried out.  The distal superficial femoral artery and popliteal junction was controlled with clamps.  A longitudinal arteriotomy made.  The vein graft was anastomosed in a reversed fashion end-to-side to the artery proximally with running 6-0 Prolene suture.  This was then tunneled along the course of the popliteal artery.  The distal anastomosis carried out into the terminal popliteal artery in an end-to-side fashion with running 6-0 Prolene suture.  At the completion of this, the graft was flushed.  The clamps were then removed.  An on table arteriogram was then obtained.  This revealed patency of the tibial vessels down to the foot.  Audible signals were heard in the anterior tibial, posterior tibial, and peroneal arteries.  Adequate hemostasis obtained.  The popliteal fossa drained with a 15 round Blake drain, exited inferiorly through the skin, and affixed to the skin with a 2-0 silk suture.  The gastrocnemius fascia and sartorius fascia were both closed with interrupted 2-0 Vicryl suture.  The subcutaneous tissue closed with running 3-0 Vicryl suture.  The skin closed with 4-0 Monocryl in a subcuticular fashion.  Steri-Strips applied.  The patient was transferred to the recovery room in stable condition. Dictated by:   Denman George, M.D. Attending Physician:  Caralee Ates. DD:  10/18/01 TD:  10/21/01 Job: [id number]

## 2011-01-27 NOTE — Discharge Summary (Signed)
[place]. [facility]  Patient:    [name], [name] Visit Number: [id number] MRN: [id number]          Service Type: SUR Location: 2000 2008 01 Attending Physician:  Melvenia Needles Dictated by:   Maxwell Marion, RNFA Admit Date:  02/19/2002 Discharge Date: 02/22/2002   CC:         CVTS office  [name], M.D. [facility]  Nathen May, M.D., [facility]   Discharge Summary  DATE OF BIRTH:  1941-01-28  DATE OF ADMISSION AND DATE OF SURGERY:  February 19, 2002  ANTICIPATED DAY OF DISCHARGE:  February 22, 2002  ADMITTING DIAGNOSIS:  Left popliteal aneurysm.  PAST MEDICAL HISTORY: 1. Right popliteal artery aneurysm status post urgent right above-knee to    below-the-knee popliteal artery bypass in February 2003 for right lower    extremity ischemia. 2. Sick sinus syndrome status post pacemaker placement by Dr. Sherryl Manges. 3. He has a history of depression. 4. He has a history of a left knee orthopedic injury.  DISCHARGE DIAGNOSIS:  Left popliteal artery aneurysm status post bypass.  ALLERGIES:  NIACIN.  Also, he has a GI intolerance to PERCOCET.  CONDITION ON DISCHARGE:  Improved.  INSTRUCTIONS ON DISCHARGE:  ACTIVITY:  He has been asked to refrain from driving for now.  He has been also instructed to continue his breathing exercises and daily walking.  DIET:  Low fat, low salt.  WOUND CARE:  He may shower at home with mild soap and water.  If any incisions become red, hot, swollen, draining or if he has a fever greater than 101 degrees Fahrenheit he is to call Dr. Madilyn Fireman office.  HISTORY OF PRESENT ILLNESS:  The patient is a 70 year old Caucasian man who returned to the CVTS office on December 16, 2001 to follow up with Dr. Madilyn Fireman after his surgery and hospitalization in February 2003.  He is recovering well from his right leg bypass.  Coumadin was discontinued on that day and aspirin was continued.  Dr. Madilyn Fireman recommended elective repair  of his left popliteal aneurysm.  The procedure, risks, and benefits were discussed with the patient and he agreed to proceed.  HOSPITAL COURSE:  On February 19, 2002, the patient was electively admitted to [facility] under the care of Dr. Denman George.  He underwent an uncomplicated left above-the-knee to below-the-knee popliteal bypass with reverse saphenous vein graft.  He tolerated this procedure well and was transferred in stable condition to the PACU.  He did have a palpable posterior tibial pulse on the left at the conclusion of the procedure.  The patient has remained hemodynamically stable since surgery and his postoperative course has been uneventful.  ABIs performed postoperative day #1 revealed the right 0.96, left greater than 1.0.  Postoperative day #2, February 21, 2002, the patients vital signs are stable and he is afebrile.  His only complaint is nausea related to his pain medication. His left leg incisions are healing well.  Skin edges and staples are intact. He has palpable posterior tibial pulses bilaterally.  He is ambulating independently.  PLAN:  The plan is to change his pain medication to ibuprofen.  If his nausea clears and he continues to improve it is anticipated he will be ready for discharge tomorrow, February 22, 2002.  MEDICATIONS ON DISCHARGE:  Ibuprofen 200 mg one to two p.o. q.4-6h. p.r.n. for pain.  He has also been instructed to resume his home medications of  sotalol 80 mg p.o. b.i.d., coated aspirin 325 mg p.o. daily, Nasacort inhaler and Claritin p.o. daily, his vitamin and supplement regimen as prior to admission.  FOLLOWUP: 1. He has an appointment to CVTS office to see the office nurse on February 26, 2002 at 10:30 a.m. for a skin staple removal. 2. He also has an appointment to see Dr. Madilyn Fireman in the CVTS office on Monday,    March 17, 2002 at 12:30. Dictated by:   Maxwell Marion, RNFA Attending Physician:  Melvenia Needles DD:   02/21/02 TD:  02/23/02 Job: [id number]

## 2011-01-27 NOTE — Op Note (Signed)
[place]. [facility]  Patient:    [name]                       MRN: [id number] Proc. Date: 09/13/99 Adm. Date:  [id number] Attending:  Ronne Binning CC:         Nicki Reaper, M.D.  (2 copies)                           Operative Report  PREOPERATIVE DIAGNOSIS:  Mucoid cyst, right index finger.  POSTOPERATIVE DIAGNOSIS:  Mucoid cyst, right index finger.  OPERATION:  Excision mucoid cyst, debridement distal interphalangeal joint, right index finger.  SURGEON:  Nicki Reaper, M.D.  ASSISTANT:  Joaquin Courts, R.N.  ANESTHESIA:  Charlesetta Ivory IV regional  ANESTHESIOLOGIST:  Judie Petit, M.D.  HISTORY:  The patient is a 70 year old male with a history of a cyst on the right index finger, distal interphalangeal joint.  DESCRIPTION OF PROCEDURE:  The patient was brought to the operating room where  forearm-based IV regional anesthetic was carried out without  difficulty.  He was prepped and draped using Betadine scrub and solution with the right arm free.  curvilinear incision was made on the radial aspect, carried ulnarly at the distal interphalangeal joint and carried down through subcutaneous tissues.  The cyst as immediately encountered, being directly beneath the skin.  With blunt and sharp  dissection, a second cyst was identified.  Both were removed and sent to pathology. The joint was opened.  An osteophyte was present on the proximal phalanx. This as removed.  Significant scarring was present between the tendon and the joint. The wound was irrigated.  The skin was then closed after excision of the skin which was translucent, with interrupted 5-0 nylon suture.  A sterile compressive dressing and splint was applied.  The patient tolerated the procedure well and was taken to he recovery room for observation in satisfactory condition.  He is discharged home to return to the [facility] in one week on Vicodin  and Keflex. DD:  09/13/99 TD:  09/13/99 Job: [id number]

## 2011-01-27 NOTE — H&P (Signed)
[place]. [facility]  Patient:    KASSIDY, FRANKSON Visit Number: [id number] MRN: [id number]          Service Type: MED Location: [phone] Attending Physician:  Caralee Ates Dictated by:   Caralee Ates, M.D. Admit Date:  10/18/2001   CC:         CVTS Office   History and Physical  ADMITTING DIAGNOSES: 1. Right lower extremity ischemia. 2. Right popliteal artery aneurysm. 3. Abdominal aortic aneurysm.  HISTORY OF PRESENT ILLNESS:  Mr. Frankey Poot is a 70 year old white gentleman who presented to the [place] Emergency Department complaining of right foot pain numbness and coolness, which began with calf pain at approximately 5:30 p.m. earlier yesterday evening.  He says that the pain in his calf subsequently settled in his foot, at which point he noticed that his foot was cooler than the left foot and somewhat numb and uncomfortable.  He also noted he was unable to palpate his own pulse.  This improved somewhat as the pain diminished; however, some of the numbness has remained.  He presented with his wife to the emergency department this morning.  He has had no prior episodes of similar symptoms; however, he does have claudication type symptoms in his right calf when exercising on a treadmill.  Otherwise he has been in usual state of good health.  Currently his right foot is not painful to him at this time, and he has good range of motion with only slight residual numbness.  ALLERGIES:  NIACIN.  MEDICATIONS AT HOME: 1. Betapace 80 mg p.o. b.i.d. 2. Aspirin 325 mg p.o. q.d.  PAST MEDICAL HISTORY: 1. Sick sinus syndrome. 2. History of depression. 3. Left knee orthopedic injury.  PAST SURGICAL HISTORY: 1. Pacemaker placement (Dr. Graciela Husbands). 2. Left knee arthroscopy.  SOCIAL HISTORY:  Denies smoking and admits to occasional ETOH use.  He lives in [place] and is currently employed as a Biomedical scientist.  FAMILY HISTORY:  His  father had a long history of coronary artery disease with abdominal aortic aneurysm, a question of peripheral aneurysms, and as well as peripheral vascular disease.  His father suffered a stroke which led to his death.  REVIEW OF SYSTEMS:  CONSTITUTIONAL:  This is a well-developed, pleasant appearing 70 year old white male.  CARDIAC:  History of sick sinus syndrome with a pacemaker in place.  VASCULAR:  No prior peripheral vascular pathology until his current problem.  PULMONARY:  Denies smoking.  Has no known acute or chronic pulmonary process, other than allergies.  GI:  Negative. MUSCULOSKELETAL:  Negative.  SKIN:  No evidence of ischemic ulceration. NEUROLOGIC:  Negative.  PSYCHIATRIC:  History of depression.  HEMATOLOGIC: Negative.  RENAL:  Negative.  ENDOCRINE:  Negative.  PHYSICAL EXAMINATION:  VITAL SIGNS:  He is afebrile.  Blood pressure 142/68, heart rate 88 and slightly irregular, respirations 16-8.  GENERAL:  This is well-developed 70 year old who appears younger than his stated age.  He is in no acute distress, and is pleasant and cooperative.  HEENT:  Negative.  CHEST:  Clear to auscultation bilaterally.  CARDIOVASCULAR:  Irregular rate, with no murmurs, rubs or gallops.  ABDOMEN:  Soft, with a palpable abdominal aortic aneurysm which was nontender.  EXTREMITIES:  Warm and dry, with the exception of his right foot -- it is somewhat cool as compared to the left, and it has a slight degree of pallor.  VASCULAR:  He has 2+ palpable radial, brachial and femoral pulses  bilaterally. The right popliteal pulse is 3-4+ and there are no palpable pedal pulses; however, there is biphasic Doppler flow on the posterior tibial artery on the right.  On the left, he has 2+ palpable popliteal and 2+ palpable posterior tibial and 1+ palpable anterior tibial and dorsalis pedis artery.  IMPRESSION:  A 70 year old with acute right lower extremity ischemia, which is due likely to his  right popliteal artery aneurysm, based on his physical examination.  His symptoms could be due to thrombosis of his aneurysm or possibly to distal embolization.  Similarly, he also has a palpable abdominal aortic aneurysm which is nonpalpable.  PLAN: 1. Will admit for systemic heparinization. 2. Will plan for an arteriogram in the morning.  Will proceed with right    above knee to below knee popliteal artery bypass, with exclusion of his    popliteal artery aneurysm -- based on the arteriogram and on his clinical    appearance.  If his foot regains normal ______ perfusion following    systemic heparinization, could potentially do this on an elective basis    next week.  Otherwise, we will potentially proceed with his aneurysm    exclusion tomorrow. Dictated by:   Caralee Ates, M.D. Attending Physician:  Caralee Ates. DD:  10/18/01 TD:  10/19/01 Job: [id number]

## 2011-01-27 NOTE — Discharge Summary (Signed)
[place]. [facility]  Patient:    SAMER, DUTTON Visit Number: [id number] MRN: [id number]          Service Type: MED Location: 2000 2034 01 Attending Physician:  Caralee Ates Dictated by:   Tollie Pizza Collins, P.A.-C. Admit Date:  10/18/2001 Disc. Date: 10/29/01   CC:         [name], M.D. [facility]  Nathen May, M.D., [facility]   Discharge Summary  ADDENDUM.  ADDITIONAL DISCHARGE DIAGNOSIS:  Recurrent right tibial artery thrombosis.  ADDITIONAL PROCEDURES: 1. Bilateral lower extremity angiogram with flexion and extension views. 2. Right tibial artery thrombectomy. 3. Intraoperative urokinase infusion. 4. Intraoperative arteriogram.  Mr. Everard was recovering fairly well following a right popliteal aneurysm repair and was slated for discharge on October 22, 2001.  Prior to discharge, he complained of some increased pain and coolness of his right lower extremity.  A repeat Doppler study was performed and it was noted that his ABIs were normal; however, when his right foot was extended, his right lower extremity pressures were dramatically lower or his pulses were inaudible.  It was felt that he might possibly have an entrapment syndrome.  Therefore, he was kept in house and, on October 22, 2001, underwent bilateral lower extremity angiogram with flexion and extension views by Caralee Ates, M.D. There was no evidence of change in flow with flexion and extension; however, it was noted that he had occlusion in his anterior and posterior tibial arteries on the right.  He was returned to the operating room on October 23, 2001, and underwent re-exploration of his right lower extremity with tibial artery thrombectomy.  Urokinase was infused intraoperatively into the artery and an intraoperative arteriogram showed the area to be patent.  He tolerated the procedure well and was returned to the floor in stable condition.  He was started on  heparin and Coumadin.  His postoperative ABIs were within normal limits regardless of position.  He has maintained normal pulses postoperatively.  He has been ambulating without difficulty.  His incisions are healing well.  It is felt that if he continues to remain stable from a vascular standpoint and his INR is therapeutic, he will be ready for discharge home on October 29, 2001.  DISCHARGE MEDICATIONS: 1. Tylox one to two q.4h. p.r.n. for pain. 2. Coumadin dose to be determined in the a.m. following morning PT and INR. 3. Zoloft 25 mg q.d. 4. Aspirin 81 mg q.d. 5. Betapace 80 mg b.i.d.  DISCHARGE INSTRUCTIONS:  These will be the same.  He will need to have a PT and INR drawn on Friday and Coumadin dose to be adjusted at that time.  DISCHARGE FOLLOW-UP:  He will see Dr. Elyn Peers in the office on Friday, November 15, 2001, at 2 oclock p.m. and he will have ABIs repeated at that time. Dictated by:   Tollie Pizza Collins, P.A.-C. Attending Physician:  Caralee Ates. DD:  10/28/01 TD:  10/28/01 Job: [id number]

## 2011-01-27 NOTE — Discharge Summary (Signed)
NAME:  [name], [name]                         ACCOUNT NO.:  [id number]   MEDICAL RECORD NO.:  [id number]                   PATIENT TYPE:  INP   LOCATION:  3705                                 FACILITY:  [facility]   PHYSICIAN:  Duke Salvia, M.D.               DATE OF BIRTH:  13-Jun-1941   DATE OF ADMISSION:  01/22/2004  DATE OF DISCHARGE:  01/25/2004                                 DISCHARGE SUMMARY   PRIMARY DIAGNOSIS:  Atrial fibrillation.   HISTORY OF PRESENT ILLNESS:  This is a 70 year old gentleman with past  medical history of coronary artery disease, cardiomyopathy, atrial  fibrillation, who has been treated with Sotalol in the past status post  permanent pacemaker, Guidant type in February, 2005.   HOSPITAL COURSE:  The patient failed amiodarone and flecainide, he was  admitted for Tikosyn therapy.  The patient was admitted and started on  Tikosyn therapy.  He converted to normal sinus rhythm on Jan 22, 2004 in the  p.m.  His potassium on Jan 24, 2004 was 3.6 which was repleted. On Jan 24, 2004 the patient had brief episodes of bursts of atrial fibrillation of  approximately one minute at 150 beats per minute.  He was started on  Cardizem which he had never received secondary to hypotension.  He was  evaluated by Dr. Graciela Husbands on discharge and was to start Cardizem CD 120 mg  q.h.s.   DISCHARGE MEDICATIONS:  1. Nasonex 2 puffs daily.  2. Claritin 10 daily.  3. Lipitor 40 at h.s.  4. Avapro 40 at h.s.  5. Enteric coated aspirin 2 tablets daily.  6. K-Dur 10 mEq daily.  7. Tikosyn 500 daily.  8. Cardizem CD 120 at h.s.   ACTIVITY:  As tolerated.   PAIN MANAGEMENT:  N/A.   DIET:  Low fat, low cholesterol.   WOUND CARE:  N/A.   FOLLOW UP:  The patient is to follow with Dr. Graciela Husbands Jan 18, 2004 at 0930.  He  is to have a BMET drawn on Thursday, Jan 28, 2004 to monitor his potassium.      Chinita Pester, C.R.N.P. [facility]                 Duke Salvia, M.D.    DS/MEDQ  D:   01/25/2004  T:  01/26/2004  Job:  [id number]   cc:   [name], M.D. [facility]

## 2011-01-27 NOTE — Assessment & Plan Note (Signed)
[facility]                                 ON-CALL NOTE   NAME:SKEEHANHamdan, Toscano                      MRN:          [id number]  DATE:12/29/2006                            DOB:          August 14, 1941    REFERRING PHYSICIAN:  Duke Salvia, MD, [facility]   Mr. Fassnacht called in this morning because his Coumadin prescription is  about to run out, and he will need a refill.  The only thing he wanted  was our fax number at the office because his pharmacy had been trying to  fax a request for a refill to our office phone number rather than our  office fax number.  I provided him with the office fax number, [phone],  and offered to call his pharmacy to refill the prescription over the  phone.  He said that he has enough Coumadin for now, and did not need me  to call in a prescription for today.  He was grateful for the fax  number.     Nicolasa Ducking, ANP  Electronically Signed    CB/MedQ  DD: 12/29/2006  DT: 12/29/2006  Job #: [id number]

## 2011-01-27 NOTE — Assessment & Plan Note (Signed)
[facility]                            CARDIOLOGY OFFICE NOTE   NAME:[name], [name]                      MRN:          [id number]  DATE:08/30/2006                            DOB:          02-04-1941    PRIMARY CARE PHYSICIAN:  Dr. Oliver [name].   REASON FOR PRESENTATION:  Evaluate patient with atrial fibrillation and  coronary disease.   HISTORY OF PRESENT ILLNESS:  The patient is now a 70 year old gentlemen  who returns for yearly followup.  Since I last saw him,  he did have his  pacemaker interrogated and manipulated because he was getting somewhat  tachycardic.  He has done much better since this time.  He remains  active.  He does exercises routinely.  This is vigorous.  He gets no  chest discomfort, neck discomfort, arm discomfort, activity-induced  nausea, vomiting, excessive diaphoresis.  He has no palpitations,  presyncope or syncope.  He does not notice any PND or orthopnea.   PAST MEDICAL HISTORY:  Coronary artery disease (catheterization November 28, 2002, left main normal, LAD proximal 99% stenosis, mid 40% stenosis,  small first diagonal, large second diagonal with luminal irregularities,  circumflex proximal 25% stenosis, right coronary artery 25% to 35% mid.  The EF is 55%.  He is status post stenting with at least 2 separate  stents, with at least one of them being a Taxus stent), paroxysmal  atrial fibrillation with bradycardia, status post permanent pacemaker  placement, dyslipidemia, insomnia, depression, peripheral vascular  disease status post right fem-pop bypass and left fem-pop bypass,  reduced ejection fraction in the past (then the most recent EF earlier  this year demonstrated to be 50% to 55%).   ALLERGIES:  NIACIN CAUSING IRREGULAR HEART RATE, ACE CAUSED A COUGH,  AMIODARONE CAUSED NEUROPATHY, FLECAINIDE CAUSED ERECTILE DYSFUNCTION.   MEDICATIONS:  1. Claritin 5 mg daily.  2. Lipitor 40 mg daily.  3. Aspirin 162 mg  daily.  4. K-Dur 10 mEq daily.  5. Cardizem 120 mg daily.  6. Tricor 145 mg daily.  7. Coumadin.  8. Tikosyn 500 __________ b.i.d.  9. Nasacort.  10.Fish oil.   REVIEW OF SYSTEMS:  As stated in the HPI and otherwise negative for  other systems.   PHYSICAL EXAMINATION:  The patient is in no distress.  Blood pressure  110/68, heart rate 62 and regular.  NECK:  No jugular venous distention at 45 degrees, carotid upstroke  brisk and symmetrical, no bruits, no thyromegaly.  LYMPHATICS:  No adenopathy.  LUNGS:  Clear to auscultation bilaterally.  BACK:  No costovertebral angle tenderness.  CHEST:  Well healed pacemaker scar.  HEART:  PMI not displaced or sustained, S1 and S2 within normal limits,  no S3, no S4, no murmurs.  ABDOMEN:  Flat, positive bowel sounds normal in frequency and pitch, no  bruits, no rebound, no guarding, no midline pulsatile mass, no  hepatomegaly, splenomegaly.  SKIN:  No rashes, no nodules.  EXTREMITIES:  With 2+ upper pulses, 1+ femorals and dorsalis pedis  bilaterally, no cyanosis, no clubbing.  NEURO:  Grossly intact.  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  There  is normal magnet function.   ASSESSMENT AND PLAN:  1. Coronary disease.  The patient is having no symptoms.  He is      practicing very aggressive secondary risk reduction.  No further      cardiovascular testing is suggested.  2. Dyslipidemia.  He is followed in the lipid clinic and now has an      excellent lipid ratio.  He will continue on the medicines as      listed.  3. Atrial fibrillation.  The patient wanted to continue his Coumadin.      He is not having any symptomatic paroxysms with this, but could be      doing it subclinically and this will be revealed time of his      pacemaker check.  He will remain on the  Tikosyn.  4. Erectile dysfunction.  The patient complained of this and will se      Dr. Jonny [name] and I did give him the name of a urologist in town.  5. Peripheral vascular disease.  He is up to date on his ultrasounds      including his carotid and his abdominal.  6. Cardiomyopathy.  His ejection fraction is much improved.  He will      continue the medications as listed.  7. Follow up.  We will see him again in one year, sooner if needed.     Rollene Rotunda, MD, [facility]  Electronically Signed    JH/MedQ  DD: 08/30/2006  DT: 08/30/2006  Job #: [id number]

## 2011-01-27 NOTE — Discharge Summary (Signed)
NAME:  [name]                         ACCOUNT NO.:  [id number]   MEDICAL RECORD NO.:  [id number]                   PATIENT TYPE:  INP   LOCATION:  2021                                 FACILITY:  [facility]   PHYSICIAN:  Buchanan [name], M.D. [facility]           DATE OF BIRTH:  06-28-1941   DATE OF ADMISSION:  11/28/2002  DATE OF DISCHARGE:  12/01/2002                           DISCHARGE SUMMARY - REFERRING   HISTORY OF PRESENT ILLNESS:  This is a 70 year old male with a history of  atrial fibrillation, status post permanent pacemaker implant secondary to  bradycardia.  The patient underwent a stress test Persantine Cardiolite in  3/01, which was negative for ischemia.  There was no evidence of scar.  The  ejection fraction was 61% at the that time with normal wall motion.   The patient was seen in the office on 11/24/02, with exertional anginal  symptoms.  The pain was radiating into his neck bilaterally as well as into  his back.  There was concern that this represented angina.  The patient was  admitted to [facility] for further evaluation.   PAST MEDICAL HISTORY:  1. As noted, the patient has paroxysmal atrial fibrillation with     bradycardia, status post permanent pacemaker.  2. History of elevated lipids.  He has been on Lipitor.  3. 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  4. History of insomnia.  5. History of depression.  6. History of allergies.   PAST SURGICAL HISTORY:  1. Status post left knee arthroscopy.  2. Status post bilateral femoral/popliteal bypass.   ALLERGIES:  NIACIN.   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  He is married.  He has two sons who live in [place].  He has not used  alcohol in three years.  He does not use tobacco.   HOSPITAL COURSE:  As noted, this patient was admitted to [facility]  for further evaluation of chest discomfort.  He underwent cardiac  catheterization  on 11/28/02, and was found to have a 90% left anterior  descending stenosis.  He underwent percutaneous transluminal coronary  angioplasty and stenting of the left anterior descending using two separate  stents.  It was recommended that he stay on Plavix for six months.  Ejection  fraction was 55% at time of catheterization.   Following the catheterization, the patient was found to have elevated  cardiac enzymes.  He was kept in the hospital for an extra day due to this  finding.  Later on telemetry, he was noted to have some abnormal spike from  his pacemaker.  The rep was asked to come in and evaluate the pacemaker on  11/30/02.  There was a question of an 11 beat run of ventricular tachycardia  with multiple premature ventricular complexes with the pacer spikes falling  after the QRS complex or within the QRS complex.  The [employer] rep  interrogated the pacemaker.  He noted V-pacing after the intrinsic QRS and  short runs of V-pacing.  He changed the sensitivity.  He felt the  sensitivity was set too high.  He felt that this would improve the  situation.   The following day, the patient was evaluated by Dr. Dietrich Pates who consulted  with Dr. Graciela Husbands regarding the patient's rhythm strips, and it was felt that  the patient was doing better following the adjustment of the sensitivity on  the pacemaker, and arrangements were made to discharge the patient home with  outpatient followup with Dr. Graciela Husbands.  He was discharged in improved and  stable condition on 12/01/02.   LABORATORY DATA:  A CBC on 11/29/02, revealed hemoglobin of 13.9, hematocrit  39.2, white blood cell count 7.2, platelets 162,000.  On 11/30/02, BUN was  10, creatinine 1.1, potassium 4, glucose 103.  On 11/30/02, CK was 167, MB  11.6.  On 11/29/02, CK was 574, MB was 105.8.  On 11/28/02, CK was 424, MB  72.5.  A chest x-ray showed no evidence of active disease with bipolar  pacemaker leads appearing to be intact.   DISCHARGE  MEDICATIONS:  1. The patient was discharged on an increased dose of Lipitor 40 mg daily.  2. Nasacort as previously.  3. Baby aspirin 81 mg daily.  4. Betapace 80 mg b.i.d.  5. Ambien p.r.n.  6. Plavix 75 mg daily x6 months.  7. Nitroglycerin p.r.n. chest pain.  He was instructed in the use of     nitroglycerin.  8. Tylenol p.r.n. pain.   ACTIVITY:  The patient was told to avoid anything strenuous until cleared by  Dr. Graciela Husbands.  He was not to lift more then 10 pounds for one week.   DIET:  He was to be on a low salt, low fat diet.   DISCHARGE INSTRUCTIONS:  He was told to call the office if he had any  increased pain, swelling, or bleeding from his groin.   FOLLOWUP:  1. He was to follow up with outpatient cardiac rehabilitation.  It was felt     that he might require an echocardiogram in two to three weeks to evaluate     his left ventricular function, and possibly fasting lipids and liver     function tests in six weeks.  2. The patient was to [name] Dr. Oliver Barre as needed or as scheduled.  3. Dr. Graciela Husbands on 01/05/03 at 12 noon.   PROBLEM LIST AT TIME OF DISCHARGE:  1. Status post percutaneous transluminal coronary angioplasty and stenting     of the left anterior descending performed on 11/28/02, ejection fraction     55% at that time.  2. Elevated cardiac enzymes following intervention.  3. History of paroxysmal atrial fibrillation with sick sinus syndrome,     status post permanent pacemaker performed in [place] using an Pr-106 [employer] [place] [facility] model [phone].  4. Elevated lipids.  5. History of peripheral vascular disease.  6. PMT arrhythmia requiring adjustment of the sensitivity of his pacemaker,     performed by the [employer] pacemaker rep on 11/30/02.   Per Dr. Dietrich Pates, it was not felt that Coumadin therapy was indicated for  the patient's history of paroxysmal atrial fibrillation secondary to low thromboembolic risk.      [name], P.A. [facility]                   [name], M.D. [facility]  DR/MEDQ  D:  12/01/2002  T:  12/01/2002  Job:  [id number]   cc:   [name], M.D. [facility]

## 2011-01-27 NOTE — Discharge Summary (Signed)
   NAME:  [name], [name]                         ACCOUNT NO.:  [id number]   MEDICAL RECORD NO.:  [id number]                   PATIENT TYPE:  INP   LOCATION:  3727                                 FACILITY:  [facility]   PHYSICIAN:  Duke Salvia, M.D. [facility]           DATE OF BIRTH:  Sep 24, 1940   DATE OF ADMISSION:  07/28/2002  DATE OF DISCHARGE:  07/29/2002                                 DISCHARGE SUMMARY   DIAGNOSIS:  Capitations and weakness.   HISTORY OF PRESENT ILLNESS:  This is a 70 year old gentleman with past  medical history of sick sinus syndrome status post pacemaker in [place].  He  has a history of atrial fibrillation, hyperlipidemia, and popliteal aneurysm  status post repair by Dr. Madilyn Fireman.  He presented with complaints of  palpitations, weakness, and near syncope.  He denies any history of  exertional chest pain, dyspnea on exertion, orthopnea, PND, or recent  syncope.  He was seen by the primary care physician on Friday and noted to  have an upper respiratory infection and sinusitis.  He was treated with  cefuroxime and Bromfed.  He also had a flu shot.  The patient took Bromfed  in the morning and subsequently had palpitations associated with weakness  and nausea for 30 minutes.  There was no chest pain, shortness of breath, or  frank syncope.  He was now improved.  Because of symptoms, he presented to  the emergency room and was observed for 24 hours.   LABORATORY DATA:  Sodium 142, potassium 4.2, chloride 107, CO2 27, glucose  104, BUN 14, creatinine 1.2.  Troponin less than 0.01.  INR 0.9.  CK 63, MB  2.1.  WBC 6.4, hemoglobin 15.4, hematocrit 46, platelets 241.  LFTs were  within normal limits.   Pacemaker was interrogated with values within normal limits.   The patient had no ectopy on telemetry.   DISCHARGE MEDICATIONS:  The patient was discharged to home with instructions  to stop Bromfed.  Other previous medications were the same as before.  1. Betapace 80  b.i.d.  2. Coated aspirin 81 daily.  3. Lipitor 10 nightly.  4. Ceftin 250 b.i.d. to complete a total of 10 days.   DIET:  Low fat, low cholesterol, low salt.    FOLLOW UP:  He was to keep his previously scheduled followup appointment  with Dr. Graciela Husbands on 08/11/2002 at 9 a.m.     Chinita Pester, C.R.N.P. [facility]                 Duke Salvia, M.D. [facility]    DS/MEDQ  D:  07/29/2002  T:  07/29/2002  Job:  [id number]   cc:   [facility]   [name], M.D. [facility]

## 2011-01-27 NOTE — Procedures (Signed)
[place]  Patient:    [name], [name] Visit Number: [id number] MRN: [id number]          Service Type: MED Location: 2000 2040 01 Attending Physician:  Caralee Ates Dictated by:   Caralee Ates, M.D. Proc. Date: 10/22/01 Admit Date:  10/18/2001   CC:         CVTS office  Peripheral vascular lab on 6th floor   Procedure Report  PREPROCEDURAL DIAGNOSES: 1. Bilateral popliteal artery aneurysm, status post right above knee to below    knee popliteal artery bypass. 2. Popliteal artery entrapment syndrome.  PROCEDURE:  Bilateral lower extremity angiogram with flexion and extension views.  SURGEON:  Caralee Ates, M.D.  ANESTHESIA:  1% lidocaine as local.  TOTAL FLUOROSCOPY TIME:  Six minutes 11 seconds.  TOTAL CONTRAST:  70 cc Visipaque.  ACCESS:  Left common femoral artery (5-French sheath).  BRIEF HISTORY:  This is a 70 year old gentleman who presented with acutely ischemic right lower extremity who was found to have bilateral popliteal artery aneurysms with distal embolization to the right foot.  He underwent revascularization with above knee to below knee popliteal artery bypass and tibial thrombectomy.  Postoperatively, he was found to have diminished signals in his right foot with extension of his right foot suggesting that he had a component of popliteal artery entrapment.  Angiogram and arteriogram with maneuvers was planned to demonstrate whether or not this was the actual cause of his diminished flow with extension.  DESCRIPTION OF PROCEDURE:  The patient was brought to the cath lab and placed in the cath lab in supine position.  The groins were then prepped and draped in sterile fashion.  Lidocaine 1% was then used to anesthetize the skin overlying the left femoral head.  A small nick incision was made and the blunt track was dissected down to the level of the left common femoral artery.  The left common femoral artery was  then percutaneously punctured.  An 035 J-wire was advanced and a 5 French sheath was placed.  Next an 035 J-wire with an IMA catheter backloaded was advanced into the infrarenal aorta.  The IMA catheter was then used to cross the bifurcation of the aorta and the guidewire was advanced into the right iliac system.  An __________ catheter was then advanced over the guidewire down the level of the distal external iliac. Serial images of the right lower extremity were obtained centering over the popliteal artery with flexion and extension views.  Next, tibial runoff images were obtained down to the foot.  Next, the ___________ catheter was then removed over a guidewire.  Next, images of the left popliteal artery were obtained with flexion and extension views. Once satisfactory images had been obtained, the patient was transported to the recovery area where the left common femoral sheath was removed and hemostasis was achieved with direct pressure.  ANGIOGRAM FINDINGS:  The above knee to below knee popliteal artery bypass is widely patent with no evidence of proximal to distal anastomotic narrowing. With flexion and extension of the right foot, there is no change in the flow through the bypass graft. The anterior tibial, posterior tibial and perineal arteries are patent at their origins; however, the anterior tibial and posterior tibial arteries are occluded just distal to the origins.  In the perineal artery, it is the single runoff vessel to the level of the ankle with collateralization onto the foot.  The dorsalis pedis and posterior tibial arteries reconstitute  the collaterals for the peroneal artery and there is relatively poor ____________ onto the foot.  On the left side, the popliteal artery aneurysm is again demonstrated.  With flexion and extension of the left foot, there is no change in the flow across the level of the aneurysm; therefore popliteal artery entrapment could not be  demonstrated.  RECOMMENDATIONS: 1. Routine post cath care. 2. Will discuss these findings with Dr. Madilyn Fireman in anticipation of repeat tibial    thrombectomy on the right side. Dictated by:   Caralee Ates, M.D. Attending Physician:  Caralee Ates. DD:  10/22/01 TD:  10/22/01 Job: [id number]

## 2011-01-27 NOTE — H&P (Signed)
[place]  Patient:    [name], [name] Visit Number: [id number] MRN: [id number]          Service Type: SUR Location: 3300 3301 01 Attending Physician:  Melvenia Needles Dictated by:   Adair Patter, P.A. Admit Date:  02/19/2002                           History and Physical  CHIEF COMPLAINT:  Left popliteal artery aneurysm.  HISTORY OF PRESENT ILLNESS:  The patient is a 70 year old male.  Several months ago, he began experiencing pain in his right lower extremity; because of this, he was referred to Dr. Denman George.  Dr. Madilyn Fireman felt at this time that this was due to thrombosis from a right popliteal artery aneurysm. Dr. Madilyn Fireman, at that time, performed bilateral lower extremity angiograms and attempted thrombolysis at this time; this was unsuccessful and Dr. Madilyn Fireman performed a right popliteal artery aneurysm repair.  While the patient was having that aneurysm, it was found that he had a left popliteal artery aneurysm; the patient now presents for elective repair of that.  He denies any pain in his buttocks, hips, thighs, calves; foot pain, rest pain or night pain.  He denies any slow-healing ulcers, gangrenous changes in his feet or ischemic changes.  He denies any peripheral edema or decreased temperature in his feet.  He denies any shortness of breath, dyspnea on exertion, chest pain or palpitations.  PAST MEDICAL HISTORY: 1. Sick sinus syndrome. 2. Depression.  PAST SURGICAL HISTORY: 1. Pacemaker placement in 1998. 2. Left knee arthroscopy. 3. Right popliteal artery aneurysm repair.  MEDICATION ALLERGIES:  The patient says he is allergic to NIACIN.  MEDICATIONS: 1. Betapace 80 mg p.o. b.i.d. 2. Aspirin 325 mg p.o. q.d.  FAMILY HISTORY:  The patients mother is deceased; she had congestive heart failure and diabetes mellitus.  His father is deceased; he had coronary artery disease and a stroke.  There is no family history of  hypertension or cancer.   SOCIAL HISTORY:  The patient is married, lives with his wife and has two children.  He denies any alcohol and has never smoked.  REVIEW OF SYSTEMS:  GENERAL:  The patient denies any fever, chills, night sweats or frequent illnesses.  HEAD:  The patient denies any head injury or loss of consciousness.  EYES:  The patient denies any glaucoma, cataracts or diplopia.  He wears corrective lenses.  EARS:  The patient denies any tinnitus, vertigo, hearing loss or ear infections.  NOSE:  The patient denies any epistaxis, rhinitis or sinusitis.  MOUTH:  The patient denies any problems with dentition or frequent sore throats.  NECK:  The patient denies any lumps, masses or pain with range of motion in his neck.  CARDIOVASCULAR:  The patient denies any angina but does have sick sinus syndrome for which he has a pacemaker placement; he also takes Betapace.  PULMONARY:  The patient denies any asthma, bronchitis, emphysema or pneumonia.  GI:  The patient denies any nausea, vomiting, diarrhea, constipation, hematochezia or melena.  GU:  The patient denies any incontinence or urinary tract infections.  ENDOCRINE:  The patient denies any thyroid disease or diabetes mellitus.  MUSCULOSKELETAL: The patient denies any arthritis, arthralgias or myalgias. NEUROLOGIC:  The patient denies depression; history of depression which is not being treated at this time.  Denies any stroke or transient ischemic attacks.  PHYSICAL EXAMINATION:  VITAL SIGNS:  Blood pressure 100/70.  Pulse is 84 and regular.  Respirations are 16.  GENERAL:  The patient is alert and oriented x3 and is in no acute distress.  HEENT:  Head is atraumatic and normocephalic.  Eyes:  Pupils equal, round and reactive to light and accommodation.  Extraocular motions are intact without scleral icterus or nystagmus.  Ears:  Auditory acuity is grossly intact. Nose:  Nasal patency intact.  Sinuses are nontender.  Mouth is  moist without exudates.  NECK:  Neck is supple without JVD, lymphadenopathy, carotid bruits or thyromegaly.  CARDIOVASCULAR:  Regular rate and rhythm without murmurs, gallops or rubs.  LUNGS:  Bilaterally clear to auscultation without rales, rhonchi or wheezes.  ABDOMEN:  Soft, nontender and nondistended.  Positive bowel sounds in all four quadrants.  EXTREMITIES:  No cyanosis, clubbing or edema.  His feet were both warm.  PERIPHERAL VASCULAR:  Peripheral pulses revealed 2+ carotid, femoral, popliteal and dorsalis pedis pulses bilaterally.  He had a 2+ posterior tibial pulse on the left and a 1+ posterior tibial pulse on the right.  NEUROLOGIC:  Cranial nerves II-XII are grossly intact.  No focal neurologic deficits were noted.  Muscle strength was 5+ and equal in all four extremities.  He had a steady gait.  IMPRESSION:  Left popliteal artery aneurysm.  PLAN:  The patient will undergo left popliteal artery aneurysm repair by Dr. Madilyn Fireman. Dictated by:   Adair Patter, P.A. Attending Physician:  Melvenia Needles DD:  02/18/02 TD:  02/20/02 Job: [id number]

## 2011-01-27 NOTE — Procedures (Signed)
[place]. [facility]  Patient:    [name], [name] Visit Number: [id number] MRN: [id number]          Service Type: MED Location: [id number] Attending Physician:  Caralee Ates Dictated by:   Denman George, M.D. Proc. Date: 10/18/01 Admit Date:  10/18/2001   CC:         Peripheral Vascular Catheterization Lab  [name], M.D. [facility]   Procedure Report  DIAGNOSIS:  Right popliteal aneurysm.  PROCEDURE:  Abdominal aortogram with bilateral lower extremity runoff arteriography.  ACCESS:  Right femoral artery, #5 French sheath.  CONTRAST:  Visipaque, 160 ml.  COMPLICATIONS:  None apparent.  CLINICAL NOTE:  This is a 70 year old male who presented to the emergency department with right foot pain.  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  Brought to the catheterization lab at this time for a diagnostic arteriography.  DESCRIPTION OF PROCEDURE:  The patient was brought to the catheterization lab in stable condition.  Informed consent obtained.  Placed in the supine position.  Both groins prepped and draped in a sterile fashion.  Administered 2 mg of Nubain, 2 mg of Versed intravenously.  Skin and subcutaneous tissue in the right groin instilled with 1% Xylocaine.  The needle easily introduced into the right common femoral artery.  A 0.035 J wire passed through the needle into the mid abdominal aorta under fluoroscopy.  A #5 French sheath was then advanced over the guidewire.  The dilator removed. The sheath flushed with heparin saline solution.  A standard pigtail catheter was then advanced over the guidewire to the juxtarenal aorta.  Standard AP mid abdominal aortogram obtained.  This reveals multiple renal arteries bilaterally which were widely patent.  The infrarenal aorta appeared to be normal in caliber without significant pathology.  Lateral aortogram was obtained and again verified a normal caliber  aorta without evidence of significant occlusion.  The pigtail catheter was then brought down to the aortic bifurcation and standard AP lower extremity runoff arteriography was obtained.  The aortic bifurcation was normal.  The common iliac arteries, internal iliac arteries, and external iliac arteries bilaterally were normal.  Right lower extremity runoff arteriography revealed normal common femoral artery.  The profunda femoris artery was also normal.  The right superficial femoral artery was normal.  The right popliteal artery revealed an aneurysm with contained thrombus.  The popliteal trifurcation revealed three patent vessels.  The right peroneal artery was occluded in the proximal calf.  The right anterior tibial artery was occluded in the mid calf.  The right posterior tibial artery revealed a short segment occlusion in the distal calf.  Left lower extremity revealed a normal common femoral artery.  The profunda femoris and superficial femoral arteries were also normal.  The left popliteal artery did reveal some irregularity consistent with early aneurysm change. The left lower extremity revealed normal tibial runoff with patent anterior tibial, posterior tibial, and peroneal arteries.  Peak hold views of the popliteal trifurcation revealed normal origin of the anterior tibial, posterior tibial, and peroneal arteries.  The right peroneal artery was occluded in the proximal third of the calf.  The right anterior tibial artery occluded in the mid third of the calf, and the right posterior tibial artery occluded in the distal calf.  This completed the arteriogram procedure.  These results were reviewed with the patient and his wife.  Recommended at this time that patient undergo repair of right popliteal artery with  right superficial femoral-to-popliteal bypass and right tibial artery thrombectomy.  This will be scheduled today. Dictated by:   Denman George, M.D. Attending  Physician:  Caralee Ates. DD:  10/18/01 TD:  10/19/01 Job: [id number]

## 2011-01-27 NOTE — H&P (Signed)
[name], [name]:  [id number]   MEDICAL RECORD NO.:  [id number]          PATIENT TYPE:  OBV   LOCATION:  4710                         FACILITY:  [facility]   PHYSICIAN:  Jonelle Sidle, M.D. LHCDATE OF BIRTH:  [birth year]   DATE OF ADMISSION:  12/12/2005  DATE OF DISCHARGE:                                HISTORY & PHYSICAL   PRIMARY CARE PHYSICIAN:  [name], M.D. [facility]   PRIMARY CARDIOLOGIST:  Rollene Rotunda, M.D.   ELECTROPHYSIOLOGIST:  Duke Salvia, M.D.   PATIENT PROFILE:  This 70 year old white male with prior history of atrial  fibrillation and CAD.  He presented to the office today after taking 3 doses  of Tikosyn in the past 12 hours.   PROBLEM LIST:  1.  CAD.      1.  On November 28, 2002 cardiac catheterization.  Left main normal.  LAD          99% proximal, 40% mid.  The first diagonal was small.  The second          diagonal was large with luminal irregularities.  The left circumflex          had a 25% proximal stenosis.  The RCA had a 25-30% mid stenosis. EF          was 55%.  He underwent PCI and stenting of the proximal LAD with 3.5          x 8 mm Taxus drug-eluting stent, and a 3.5 x 8 mm Express II bare          metal stent.  2.  Paroxysmal atrial fibrillation with bradycardia, status post permanent      pace maker placement.  3.  Hyperlipidemia.  4.  Insomnia.  5.  Depression.  6.  Peripheral vascular disease, status post femoral-popliteal bypass.   ALLERGIES:  NIACIN causes irregular heart beat.  The ACE INHIBITOR causes  cough.  AMIODARONE causes neuropathy.  FLECAINIDE causes sexual dysfunction.   HISTORY OF PRESENT ILLNESS:  A 70 year old white male with a history as  outlined above, who is currently on Tikosyn therapy for management of  paroxysmal atrial fibrillation.  He typically takes this at 12 p.m. and 12  a.m.  This morning at around 12 a.m. He recognized that he had missed his 12  p.m. dose of Tikosyn on December 11, 2004, as a result he took his 12 a.m. dose  and then got up this morning at 8 a.m. and took a second dose; and then a  third dose at 12 p.m. today.  He was concerned that he had taken too much  Tikosyn in such a short amount of time and came into the office as a walk-  in.  He is otherwise asymptomatic and is currently in sinus rhythm with a  stable QTC of 460 milliseconds.  His case was discussed with Dr. Graciela Husbands and a  decision was made to admit him for a 23-hour observation and QTC monitoring  to assess for arrhythmia.  He denies any chest pain, palpitations, shortness  of  breath, PND, orthopnea, dizziness, syncope, edema, or early satiety.   CURRENT MEDICATIONS:  1.  Nasonex 2 puffs daily.  2.  Claritin 5 mg daily.  3.  Lipitor 40 mg q.h.s.  4.  Aspirin 160 mg daily.  5.  K-Dur 10 mEq daily.  6.  Cardizem CD 120 mg q.h.s.  7.  Tricor 145 mg daily.  8.  Coumadin as directed.  9.  Tikosyn 500 mcg b.i.d.   FAMILY HISTORY:  Mother died of complications of heart failure and diabetes  at age 9.  Father died at age 47 following his second stroke.  He also had  a history of CAD, MI, CABG, and CEA.  He has 2 sisters who are alive and  well.   SOCIAL HISTORY:  He lives in [place] with his wife.  He has grown  children who have now moved out of the house.  He works locally as a Psychologist, sport and exercise.  He has never used tobacco and did drink moderately heavy for many  years, quitting in 2000.   REVIEW OF SYSTEMS:  See HPI. All other systems reviewed and are negative.   PHYSICAL EXAMINATION:  VITAL SIGNS:  He is afebrile. Heart rate 59  beats/minute.  Respirations 16.  Blood pressure is 130/70.  GENERAL:  A pleasant white male in no acute distress.  Awake, alert and  oriented x3.  NECK:  Normal carotid upstrokes.  No bruits or JVD.  LUNGS:  Respirations regular and unlabored.  Clear to percussion and  auscultation.  CARDIAC:  Regular.  Normal S1-S2.  No S3-S4 or murmurs.  ABDOMEN:   Round, soft, and nontender.  Nondistended.  Bowel sounds present.  EXTREMITIES:  All 4 warm and dry.  Pink.  No clubbing, cyanosis, or edema.  Dorsalis pedis and posterior tibial pulses are 1+ and equal bilaterally.   CLINICAL FINDINGS:  ECG shows sinus bradycardia heart rate of 49 and a QTC  of sinus bradycardia, heart rate of 49, a QTC of 460 milliseconds.  No acute  ST-T changes.   ASSESSMENT AND PLAN:  1.  Paroxysmal atrial fibrillation.  Following discussion with Dr. Graciela Husbands a      decision was made to admit the patient for 23-hour observation      considering that he is taking 500 mcg or three doses of 500 mcg Tikosyn      in the past 12 hours. Our concern obviously would be for ventricular      arrhythmia and we will admit up to telemetry.  Continue Coumadin as      directed.  2.  Coronary artery disease.  He is doing well from this standpoint. He      remains on aspirin statin and __________ therapy.  3.  History of reduced LV function at last check by Myoview.  In February      2005 ejection fraction was 54%.  He is euvolemic on exam.  4.  Hyperlipidemia.  Continue on therapy.  5.  Peripheral vascular disease Continue aspirin and statin.  6.  History of bradycardia.  He is status post pacemaker placement.   DISPOSITION:  Providing the patient has no arrhythmias or widening of the  QTC he will likely be discharged in the a.m.      Ok Anis, NP    ______________________________  Jonelle Sidle, M.D. [facility]    CRB/MEDQ  D:  12/12/2005  T:  12/12/2005  Job:  [id number]

## 2011-01-27 NOTE — Op Note (Signed)
[place]. [facility]  Patient:    [name], [name] Visit Number: [id number] MRN: [id number]          Service Type: SUR Location: 2000 2008 01 Attending Physician:  Melvenia Needles Dictated by:   Denman George, M.D. Proc. Date: 02/19/02 Admit Date:  02/19/2002   CC:         [name], M.D. [facility]   Operative Report  SURGEON:  Denman George, M.D.  ASSISTANT:  Elie Goody, R.N.F.A.  ANESTHETIC:  General endotracheal.  PREOPERATIVE DIAGNOSIS:  Left popliteal aneurysm.  POSTOPERATIVE DIAGNOSIS:  Left popliteal aneurysm.  PROCEDURE:  Repair of left popliteal aneurysm with above-knee to below-knee popliteal reversed saphenous vein graft.  CLINICAL NOTE:  This is a 70 year old male who initially presented in February of this year with an ischemic right lower extremity.  Workup for this revealed a right popliteal aneurysm, and he underwent emergency surgery for revascularization of his right leg with trap ligation of his right popliteal aneurysm.  During this workup, he was also noted to have an asymptomatic left popliteal aneurysm.  He is now essentially fully recovered from right leg surgery, brought to the operating room at this time for elective repair of left popliteal aneurysm.  OPERATIVE PROCEDURE:  The patient was brought to the operating room in a stable condition. General endotracheal anesthesia was induced.  The left leg was prepped and draped in a sterile fashion.  A longitudinal skin incision was made in the distal thigh along the ______ groove.  Dissection was carried down through the subcutaneous tissue.  The saphenous vein was identified.  This was of good size and quality. The saphenous vein was mobilized. The tributaries were ligated with 3-0 silk and divided.  The satorius muscle was then reflected anteriorly.  The saphenous nerve was reflected anteriorly.  The popliteal artery was identified proximal to the  aneurysmal segment, mobilized, and encircled with a vessel loop. Distal dissection was then carried down along the popliteal artery to expose the aneurysmal segment.  The geniculate branches arising were ligated with clips.  The popliteal artery was encircled proximally with an umbilical tape.  A second medial skin incision was then made along the medial popliteal fossa below the knee.  Dissection was carried down through the subcutaneous tissue. The saphenous vein was exposed through this incision.  The tributaries were ligated with 3-0 silk.  The subcutaneous tunnel created between two incisions and the vein mobilized fully throughout the length of the tunnel.  The gastrocnemius fascia was then incised.  The popliteal fossa entered. The distal below-knee popliteal artery identified, freed, and encircled with a vessel loop. This was a relatively normal artery, though moderately large in caliber.  Proximal dissection then carried up along the popliteal fossa to expose the distal margin of the popliteal aneurysm.  Further geniculate branches were ligated with clips and 2-0 silk tie.  The popliteal encircled distally with an umbilical tape. The patient was administered 5,000 units of Heparin intravenously.  Trap ligation of the popliteal aneurysm was then carried out with the umbilical tapes.  The saphenous vein was then harvested and ligated proximally and distally with 2-0 silk tie.  Reverse and dilated with heparin and saline solution.  Uniform in size and quality throughout.  The popliteal artery proximal the aneurysm sac was controlled with clamps. A longitudinal arteriotomy was made.  The saphenous vein beveled and anastomosed end-to-side to the popliteal artery with running 6-0 Prolene suture.  The vein  was then tunneled anatomically along the popliteal artery to the below-knee segment.  The below-knee popliteal artery controlled with fine clamps.  A longitudinal arteriotomy was  made.  The vein divided, beveled, and anastomosed end-to-side to the below-knee popliteal artery with running 6-0 Prolene suture.  At the completion of this, adequate flushing was carried out.  Clamps were removed.  Excellent flow present.  Adequate hemostasis obtained.  The patient was administered 50 mg of Protamine intravenously.  The incisions were then closed with a deep layer of running 2-0 Vicryl suture and the superficial layer with running 3-0 Vicryl suture.  Staples were applied to the skin.  Sterile dressing was applied.  The patient tolerated the procedure well.  No apparent intraoperative complications.  2+ left tibial pulse at the termination of the procedure.  The patient transferred to the recovery room in stable condition. Dictated by:   Denman George, M.D. Attending Physician:  Melvenia Needles DD:  02/19/02 TD:  02/21/02 Job: [id number]

## 2011-01-27 NOTE — Procedures (Signed)
NAMEWORTHINGTON, CRUZAN NO.:  [id number]   MEDICAL RECORD NO.:  [id number]          PATIENT TYPE:  OUT   LOCATION:  SLEEP CENTER                 FACILITY:  [facility]   PHYSICIAN:  Marcelyn Bruins, M.D. [facility] DATE OF BIRTH:  October 29, 1940   DATE OF STUDY:  09/18/2004                              NOCTURNAL POLYSOMNOGRAM   REFERRING PHYSICIAN:  Marcelyn Bruins, MD   INDICATION FOR STUDY:  Hypersomnia with sleep apnea.   Epworth score is 14.   SLEEP ARCHITECTURE:  The patient had total sleep time of 312 minutes with  decreased REM and slow wave sleep. Sleep onset latency was normal, and REM  latency was prolonged.   IMPRESSION:  1.  Moderate obstructive sleep apnea/hypopnea syndrome with a respiratory      disturbance index of 25 events per hour and O2 desaturation as low as      81%. Events were more frequent in the supine position, however, was not      limited to this. Small numbers of central apneas were noted as well.  2.  Loud snoring noted throughout the study  3.  No clinically significant cardiac arrhythmias for tachycardia.  4.  Small numbers of leg jerks with significant sleep disruption. Clinical      correlation is suggested.      KC/MEDQ  D:  09/26/2004 17:24:51  T:  09/26/2004 18:23:46  Job:  [id number]

## 2011-01-27 NOTE — Discharge Summary (Signed)
NAMEJERMON, [name] NO.:  [id number]   MEDICAL RECORD NO.:  [id number]          PATIENT TYPE:  OBV   LOCATION:  4710                         FACILITY:  [facility]   PHYSICIAN:  Jonelle Sidle, M.D. LHCDATE OF BIRTH:  07-29-1941   DATE OF ADMISSION:  12/12/2005  DATE OF DISCHARGE:  12/13/2005                                 DISCHARGE SUMMARY   PRIMARY CARDIOLOGIST:  Dr. Rollene Rotunda   PRIMARY ELECTROPHYSIOLOGIST:  Dr. Sherryl Manges   PRIMARY CARE PHYSICIAN:  Dr. [name]   PRINCIPAL DIAGNOSIS:  Paroxysmal atrial fibrillation/question drug toxicity.   SECONDARY DIAGNOSES:  1.  Coronary artery disease.  2.  Hyperlipidemia.  3.  Insomnia.  4.  Depression.  5.  Peripheral vascular disease status post bilateral fem-pop bypass.   ALLERGIES:  NIACIN causes irregular heartbeat, ACE INHIBITOR causes cough,  AMIODARONE causes neuropathy, FLECAINIDE causes sexual dysfunction.   PROCEDURES:  None.   HISTORY OF PRESENT ILLNESS:  70 year old white male with history as outlined  above who is currently on Tikosyn therapy for management of paroxysmal  atrial fibrillation.  He typically takes his Tikosyn at 12 p.m. and 12 a.m.  On the morning of December 12, 2005 at midnight he realized he had skipped his  12 p.m. dose of Tikosyn from April 2 and as a result took his 12 a.m. dose  and then took an 8 a.m. dose on April 3 and then a 12 p.m. dose also on  April 3, thus taking 1.5 mg of Tikosyn over a 12-hour period.  He was  concerned that he had done this and presented to our office.  His ECG in the  office showed an atrial paced rhythm with a QTC of 460.  We discussed the  case with Dr. Graciela Husbands and decision was made to admit him for 23-hour  observation to ensure that he is not having any prolongation of QTC or  ventricular arrhythmias.   HOSPITAL COURSE:  Patient has been asymptomatic throughout his short  observation.  He has not had any significant arrhythmias and  his QTC is  stable at 468 milliseconds.  He is being discharged home today in  satisfactory condition.   DISCHARGE LABORATORIES:  Hemoglobin 13, hematocrit 37.3, WBC 4.1, platelets  156, MCV 96.  Sodium 141, potassium 3.7, chloride 107, CO2 30, BUN 18,  creatinine 1.4, glucose 85.  PT 28.9, INR 2.7, PTT 48.  Calcium 9.1,  magnesium 2.5.  TSH 1.865.   DISPOSITION:  Patient is being discharged home today in good condition.   FOLLOW-UP PLANS AND APPOINTMENTS:  He is asked to follow up with Dr. Graciela Husbands  and Dr. Antoine Poche as previously scheduled.   DISCHARGE MEDICATIONS:  1.  Nasonex AQ two squirts daily.  2.  Claritin 10 mg half a tablet daily.  3.  Lipitor 40 mg q.h.s.  4.  Aspirin 162 mg daily.  5.  K-Dur 10 mEq daily.  6.  Tikosyn 500 mcg q.12h.  7.  Cardizem CD 120 mg daily or q.h.s.  8.  TriCor 145 mg daily.  9.  Coumadin  as directed.  10. Fish oil 1 g q.i.d.   OUTSTANDING LABORATORY STUDIES:  None.   DURATION OF DISCHARGE ENCOUNTER:  35 minutes including physician time.      Ok Anis, NP    ______________________________  Jonelle Sidle, M.D. [facility]    CRB/MEDQ  D:  12/13/2005  T:  12/14/2005  Job:  [id number]   cc:   [name], M.D. [facility]  [address]  [place]  [place] [address]

## 2011-01-27 NOTE — Cardiovascular Report (Signed)
NAME:  [name], [name]                         ACCOUNT NO.:  [id number]   MEDICAL RECORD NO.:  [id number]                   PATIENT TYPE:  OIB   LOCATION:  2021                                 FACILITY:  [facility]   PHYSICIAN:  Rollene Rotunda, M.D. [facility]            DATE OF BIRTH:  1940-12-20   DATE OF PROCEDURE:  11/28/2002  DATE OF DISCHARGE:                              CARDIAC CATHETERIZATION   PRIMARY CARE PHYSICIAN:  [name], M.D.   CARDIOLOGIST:  Duke Salvia, M.D.   PROCEDURE:  Left heart catheterization/coronary arteriography.   INDICATIONS FOR PROCEDURE:  Evaluate patient with exertional angina.   PROCEDURAL NOTE:  Left heart catheterization was performed via the right  femoral artery.  The right femoral artery was cannulated using an anterior  wall puncture.  A 6-French arterial sheath was inserted via the modified  Seldinger technique.  Preformed Judkins and a pigtail catheter were  utilized.   RESULTS:  1. Hemodynamics     A. LV:  127/9.     B. AO:  124/71.  2. Coronaries     A. The left main was normal.     B. The LAD had a proximal 99% stenosis.  There was a mid 40% stenosis.        There was a small first diagonal.  There was a large second diagonal        with luminal irregularities.  There was a small third diagonal.     C. The circumflex in the proximal segment had 25% diffuse stenosis.  The        vessel was very small in the AV groove and essentially consisted of a        very large branching mid obtuse marginal.     D. The right coronary artery was a dominant vessel.  It had diffuse        proximal and mid disease.  There was a focal 50% proximal stenosis and        a 50% mid stenosis followed by a 30% stenosis before the acute        marginal.  The mid segment was somewhat aneurysmal.  The distal vessel        was free of disease.     E. Left ventriculogram:  A left ventriculogram was obtained in the RAO        projection.  The EF was  approximately 55% with mild anterior        hypokinesis.   CONCLUSION:  Severe single-vessel coronary artery disease.   PLAN:  Percutaneous revascularization of the LAD per Dr. Chales Abrahams.                                               Rollene Rotunda, M.D. [facility]  JH/MEDQ  D:  11/28/2002  T:  11/29/2002  Job:  [id number]   cc:   [name], M.D. [facility]

## 2011-01-27 NOTE — Assessment & Plan Note (Signed)
[facility]                              CARDIOLOGY OFFICE NOTE   NAME:Morneault, JOSHIAH TRAYNHAM                      MRN:          [id number]  DATE:05/10/3006                            DOB:          Jun 02, 1941    Mr. Balestrieri comes in today for follow up of his antihyperlipidemia therapy.  His current medications include Lipitor 40 mg daily, Tricor 145 mg daily,  fish oil 1 gram four times a day, and other medications include Nasonex,  Claritin, aspirin, potassium, Cardizem, Coumadin, Tikosyn, Nasacort, and  p.r.n. Ambien.   PHYSICAL EXAMINATION:  GENERAL AND VITAL SIGNS:  Weight is 201, blood  pressure of 115/70, and heart rate is 56.   LABORATORY DATA:  Includes a total cholesterol of 111, triglycerides 25, HDL  43.4, LDL 63, and liver function tests are within normal limits.   ASSESSMENT:  Mr. Lall lipid profile looks wonderful.  His triglycerides  are very low at 25, HDL is above the goal of greater than 40, LDL is at goal  of less than 70, and he has been tolerating his medicines just fine.  He has  been continuing with his low fat, heart healthy diet.  He has actually been  exercising more lately.  He does report some increase flatulence since  increasing fish oil to 4 grams a day but he is unsure if this is really the  cause.   PLAN:  He may continue with the same medications although I asked him and he  agreed to decrease the fish oil to 2 grams a day to see if this helps with  the flatulence problem and because his triglycerides are so low he could  probably get by with a lower dose of fish oil.  He is going to continue with  his diet and exercise which is very good.  He is going to follow up in six  months and we will re-evaluate his lipid panel and liver function tests and  make any adjustments that need to be better warranted.                                 Charolotte Eke, PharmD    TP/MedQ  DD:  05/10/2006  DT:  05/11/2006  Job  #:  [id number]

## 2011-02-09 ENCOUNTER — Encounter: Payer: Self-pay | Admitting: Internal Medicine

## 2011-02-21 ENCOUNTER — Encounter: Payer: Self-pay | Admitting: Internal Medicine

## 2011-02-21 ENCOUNTER — Ambulatory Visit (INDEPENDENT_AMBULATORY_CARE_PROVIDER_SITE_OTHER): Payer: Medicare Other | Admitting: Internal Medicine

## 2011-02-21 VITALS — BP 110/66 | HR 60 | Ht 75.0 in | Wt 216.1 lb

## 2011-02-21 DIAGNOSIS — I4891 Unspecified atrial fibrillation: Secondary | ICD-10-CM

## 2011-02-21 DIAGNOSIS — G4733 Obstructive sleep apnea (adult) (pediatric): Secondary | ICD-10-CM

## 2011-02-21 DIAGNOSIS — Z950 Presence of cardiac pacemaker: Secondary | ICD-10-CM

## 2011-02-21 DIAGNOSIS — N529 Male erectile dysfunction, unspecified: Secondary | ICD-10-CM

## 2011-02-21 DIAGNOSIS — I2510 Atherosclerotic heart disease of native coronary artery without angina pectoris: Secondary | ICD-10-CM

## 2011-02-21 LAB — BASIC METABOLIC PANEL
Calcium: 9.9 mg/dL (ref 8.4–10.5)
Chloride: 105 mEq/L (ref 96–112)
Creatinine, Ser: 1.3 mg/dL (ref 0.4–1.5)
GFR: 56.27 mL/min — ABNORMAL LOW (ref >60.00)

## 2011-02-21 LAB — MAGNESIUM: Magnesium: 2.5 mg/dL (ref 1.5–2.5)

## 2011-02-21 NOTE — Patient Instructions (Addendum)
Your physician wants you to follow-up in: 6 months. You will receive a reminder letter in the mail two months in advance. If you don't receive a letter, please call our office to schedule the follow-up appointment.  Your physician recommends that you continue on your current medications as directed. Please refer to the Current Medication list given to you today.  Your physician recommends that you have lab work today: bmp/magnesium ([phone])

## 2011-02-21 NOTE — Assessment & Plan Note (Signed)
Stable on current medications 

## 2011-02-21 NOTE — Assessment & Plan Note (Signed)
If his atrial fibrillation remained high, but anticipate review of his sleep apnea treatment

## 2011-02-21 NOTE — Assessment & Plan Note (Signed)
This is a major concern for the patient. We discussed a serial drug limitation trial excluding his dofetilide. He will plan to stop all of his medications for 6 weeks and see what happens.

## 2011-02-21 NOTE — Assessment & Plan Note (Signed)
[employer]-approaching ERI

## 2011-02-21 NOTE — Assessment & Plan Note (Signed)
He is in atrial fibrillation burden which is largely asymptomatic comprising 45% his heart beats.  For right now we will continue him on Tikosyn as it has been associated with a marked decrease in the symptoms of his atrial fibrillation. We will check his magnesium and potassium today. His QTC is norma.  We discussed antiarrhythmic therapy as relates to outcome. There are still no data apart from a paper that I saw referred to earlier this week whichwas a long term folowup for AFFIRM suggesting long term mortality may be decreased with rhythm control  We also talked about anticoagulation. He is a CHADS VASC score of at least 2. I would anticipate putting him on apixoban upon his release

## 2011-02-21 NOTE — Progress Notes (Signed)
HPI: [name] is a 70 y.o. male Seen in followup for atrial fibrillation echo occurs with obstructive sleep apnea. He has been doing really very well from erythropoietin. He has few symptoms of exercise intolerance and few palpitations. Occasionally he will take his pulse and sensing irregularity.  He continues to take Tikosyn   His big concern is erectile dysfuncition which he thinks is caused by medications.  He ascribes its onset to exposure to [facility] Current Outpatient Prescriptions  Medication Sig Dispense Refill  . aspirin (BAYER ASPIRIN) 325 MG tablet Take 325 mg by mouth daily.        Marland Kitchen atorvastatin (LIPITOR) 80 MG tablet 1/2 tab po qd  30 tablet  12  . diltiazem (DILACOR XR) 120 MG 24 hr capsule Take 1 capsule (120 mg total) by mouth daily.  90 capsule  3  . dofetilide (TIKOSYN) 500 MCG capsule Take 1 capsule (500 mcg total) by mouth 2 (two) times daily.  180 capsule  3  . fenofibrate (TRICOR) 145 MG tablet Take 1 tablet (145 mg total) by mouth daily.  90 tablet  3  . KLOR-CON M10 10 MEQ tablet take 1 tablet by mouth twice a day  60 tablet  3  . mometasone (NASONEX) 50 MCG/ACT nasal spray Place 2 sprays into the nose daily.        . tadalafil (CIALIS) 20 MG tablet Take 20 mg by mouth daily as needed.        . zolpidem (AMBIEN) 10 MG tablet Take 10 mg by mouth at bedtime as needed.        Marland Kitchen DISCONTD: atorvastatin (LIPITOR) 80 MG tablet Take 1 tablet (80 mg total) by mouth daily.  90 tablet  3  . DISCONTD: potassium chloride (KLOR-CON 10) 10 MEQ CR tablet Take 1 tablet (10 mEq total) by mouth daily.  90 tablet  3    Allergies  Allergen Reactions  . Ace Inhibitors     REACTION: cough  . Amiodarone     REACTION: peripheral neuropathy  . Nasonex     Causes nasal bleeding and nose bleeds  . Niacin     REACTION: irregular heartbeat    Past Medical History  Diagnosis Date  . Hyperlipidemia   . Coronary artery disease   . Atrial fibrillation   . Peripheral vascular  disease   . Allergic rhinitis   . Depression   . OSA (obstructive sleep apnea)     CPAP  . Arthritis   . Bilateral popliteal artery aneurysm   . Colon cancer high risk     colonoscopy 2006 by Dr. Victorino Dike, next colonoscopy at 10 year interval    Past Surgical History  Procedure Date  . Inguinal hernia repair   . Stent (unspec)     x2  . 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 Right and left fem-pop bypass   . Hand surgery   . Left knee surgery     Family History  Problem Relation Age of Onset  . Diabetes Other     family hx  . Sleep apnea Other     family hx    History   Social History  . Marital Status: Married    Spouse Name: N/A    Number of Children: N/A  . Years of Education: N/A   Occupational History  . Not on file.   Social History Main Topics  . Smoking status: Never Smoker   . Smokeless  tobacco: Not on file  . Alcohol Use: No  . Drug Use: No  . Sexually Active:    Other Topics Concern  . 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     Fourteen point review of systems was negative except as noted in HPI and PMH   PHYSICAL EXAMINATION  Blood pressure 110/66, pulse 60, height 6\' 3"  (1.905 m), weight 216 lb 1.9 oz (98.031 kg).   Well developed and nourished in no acute distress HENT normal Neck supple with JVP-flat Carotids brisk and full without bruits Back without scoliosis or kyphosis Clear Regular rate and rhythm, no murmurs or gallops Abd-soft with active BS without hepatomegaly or midline pulsation Femoral pulses 2+ distal pulses intact No Clubbing cyanosis edema Skin-warm and dry LN-neg submandibular and supraclavicular A & Oriented CN 3-12 normal  Grossly normal sensory and motor function Affect engaging .  ECG demonstrates atrial paced rhythm at 69.10 6.09/0.46

## 2011-02-24 ENCOUNTER — Telehealth: Payer: Self-pay | Admitting: *Deleted

## 2011-02-24 MED ORDER — TRIAMCINOLONE ACETONIDE(NASAL) 55 MCG/ACT NA INHA: 1.0000 | Freq: Two times a day (BID) | NASAL | Status: DC

## 2011-02-24 NOTE — Telephone Encounter (Signed)
Done per emr 

## 2011-02-24 NOTE — Telephone Encounter (Signed)
Pt is requesting callback regarding prescription for Nasonex-left message for pt to callback office

## 2011-02-24 NOTE — Telephone Encounter (Signed)
Pt is requesting rx for Nasocort AQ be sent to [facility]-pt states that he cannot tolerate the Nasonex rx because it has caused severe nose bleeds-he contacted his insurance and they advised pt that PA would be needed if pt wanted Nasocort AQ rx

## 2011-02-27 NOTE — Telephone Encounter (Signed)
Pt advised of Rx and PA process. Will contact pharmacy to send PA paperwork ASAP.

## 2011-06-22 LAB — DIFFERENTIAL
Basophils Absolute: 0
Basophils Relative: 1
Eosinophils Relative: 7 — ABNORMAL HIGH
Lymphocytes Relative: 25
Monocytes Absolute: 0.5
Monocytes Relative: 11

## 2011-06-22 LAB — COMPREHENSIVE METABOLIC PANEL
AST: 27
Albumin: 3.8
Alkaline Phosphatase: 36 — ABNORMAL LOW
Chloride: 107
GFR calc Af Amer: >60
Potassium: 5.3 — ABNORMAL HIGH
Total Bilirubin: 0.9
Total Protein: 6.4

## 2011-06-22 LAB — CBC
Platelets: 179
RDW: 14.1 — ABNORMAL HIGH
WBC: 4.4

## 2011-08-28 ENCOUNTER — Encounter: Payer: Self-pay | Admitting: Internal Medicine

## 2011-08-28 ENCOUNTER — Ambulatory Visit (INDEPENDENT_AMBULATORY_CARE_PROVIDER_SITE_OTHER): Payer: Medicare Other | Admitting: Internal Medicine

## 2011-08-28 DIAGNOSIS — I2510 Atherosclerotic heart disease of native coronary artery without angina pectoris: Secondary | ICD-10-CM

## 2011-08-28 DIAGNOSIS — G4733 Obstructive sleep apnea (adult) (pediatric): Secondary | ICD-10-CM

## 2011-08-28 DIAGNOSIS — N529 Male erectile dysfunction, unspecified: Secondary | ICD-10-CM

## 2011-08-28 DIAGNOSIS — I4891 Unspecified atrial fibrillation: Secondary | ICD-10-CM

## 2011-08-28 DIAGNOSIS — Z950 Presence of cardiac pacemaker: Secondary | ICD-10-CM

## 2011-08-28 LAB — PACEMAKER DEVICE OBSERVATION
AL AMPLITUDE: 1.5 mv
ATRIAL PACING PM: 82
RV LEAD IMPEDENCE PM: 300 Ohm
RV LEAD THRESHOLD: 0.9 V

## 2011-08-28 NOTE — Assessment & Plan Note (Signed)
He keeps close track of the effectiveness of his monitor as he measured his AHI

## 2011-08-28 NOTE — Assessment & Plan Note (Signed)
We will try a drug limitation trial again

## 2011-08-28 NOTE — Assessment & Plan Note (Signed)
No intercurrent chest pain;lipids are in good range

## 2011-08-28 NOTE — Assessment & Plan Note (Addendum)
The patient continues wit 35% atrial fibrillation, it symptoms however markedly attenuated by ongoing use of Tikosyn. He also asks about the [employer] CLS system and as to whether it will likely have any impact on atrial fibrillation burden. I told him I would look into this.  We also discussed anticoagulation. His bleeding with warfarin. We'll decrease his aspirin from 325-162  He understands his CHADS VASC score is 2. He awiaits the release of apixoban

## 2011-08-28 NOTE — Progress Notes (Signed)
HPI  [name] is a 70 y.o. 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 He has been doing really very well from symptomatic point of view although there is a significant atrial fibrillation burden detected by his device at his last followup 6 months ago. Hence he has remained on Tikosyn He has few symptoms of exercise intolerance and few palpitations. Occasionally he will take his pulse and sensing irregularity.   First to identify the drug responsible for ED has been unsuccessful so far. He thinks it is clearly drug-related as it first occurred following exposure to amiodarone.     Past Medical History  Diagnosis Date  . Hyperlipidemia   . Coronary artery disease     LAD stenting 2004 non-DES  . Atrial fibrillation     previous therapy includes amiodarone and sotalol  . Peripheral vascular disease   . Allergic rhinitis   . Depression   . OSA (obstructive sleep apnea)     CPAP  . Arthritis   . Bilateral popliteal artery aneurysm   . Colon cancer high risk     colonoscopy 2006 by Dr. Victorino Dike, next colonoscopy at 10 year interval    Past Surgical History  Procedure Date  . Inguinal hernia repair   . Stent (unspec)     x2  . 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 Right and left fem-pop bypass   . Hand surgery   . Left knee surgery     Current Outpatient Prescriptions  Medication Sig Dispense Refill  . aspirin (BAYER ASPIRIN) 325 MG tablet Take 325 mg by mouth daily.        Marland Kitchen atorvastatin (LIPITOR) 80 MG tablet 1/2 tab po qd  30 tablet  12  . diltiazem (DILACOR XR) 120 MG 24 hr capsule Take 1 capsule (120 mg total) by mouth daily.  90 capsule  3  . dofetilide (TIKOSYN) 500 MCG capsule Take 1 capsule (500 mcg total) by mouth 2 (two) times daily.  180 capsule  3  . fenofibrate (TRICOR) 145 MG tablet Take 1 tablet (145 mg total) by mouth  daily.  90 tablet  3  . KLOR-CON M10 10 MEQ tablet take 1 tablet by mouth twice a day  60 tablet  3  . tadalafil (CIALIS) 20 MG tablet Take 20 mg by mouth daily as needed.        . triamcinolone (NASACORT) 55 MCG/ACT nasal inhaler Place 1 spray into the nose 2 (two) times daily.       Marland Kitchen zolpidem (AMBIEN) 10 MG tablet Take 10 mg by mouth at bedtime as needed.          Allergies  Allergen Reactions  . Ace Inhibitors     REACTION: cough  . Amiodarone     REACTION: peripheral neuropathy  . Nasonex     Causes nasal bleeding and nose bleeds  . Niacin     REACTION: irregular heartbeat    Review of Systems negative except from HPI and PMH  Physical Exam Well developed and well nourished in no acute distress HENT normal E scleral and icterus clear Neck Supple JVP flat; carotids brisk and full Clear to ausculation Regular rate and rhythm, no murmurs gallops or rub Soft with active bowel sounds No clubbing cyanosis none Edema Alert and oriented, grossly normal motor and sensory function Skin Warm and Dry  Assessment and  Plan

## 2011-08-28 NOTE — Patient Instructions (Signed)
Your physician has recommended you make the following change in your medication:  1) Decrease aspirin to 1/2 tablet once daily   Your physician recommends that you schedule a follow-up appointment in: 1 month with Belenda Cruise and Gunnar Fusi in the device clinic.

## 2011-08-28 NOTE — Assessment & Plan Note (Signed)
The patient's device was interrogated.  The information was reviewed. No changes were made in the programming.    

## 2011-09-27 ENCOUNTER — Other Ambulatory Visit: Payer: Self-pay | Admitting: *Deleted

## 2011-09-27 ENCOUNTER — Ambulatory Visit (INDEPENDENT_AMBULATORY_CARE_PROVIDER_SITE_OTHER): Payer: Medicare Other | Admitting: *Deleted

## 2011-09-27 ENCOUNTER — Encounter: Payer: Self-pay | Admitting: Internal Medicine

## 2011-09-27 DIAGNOSIS — I495 Sick sinus syndrome: Secondary | ICD-10-CM

## 2011-09-27 LAB — PACEMAKER DEVICE OBSERVATION

## 2011-09-27 MED ORDER — POTASSIUM CHLORIDE CRYS ER 10 MEQ PO TBCR: 10.0000 meq | EXTENDED_RELEASE_TABLET | Freq: Two times a day (BID) | ORAL | Status: DC

## 2011-09-27 MED ORDER — DILTIAZEM HCL ER 120 MG PO CP24: 120.0000 mg | ORAL_CAPSULE | Freq: Every day | ORAL | Status: DC

## 2011-09-27 NOTE — Progress Notes (Signed)
PPM battery check 

## 2011-10-30 ENCOUNTER — Encounter: Payer: Medicare Other | Admitting: *Deleted

## 2011-11-02 ENCOUNTER — Encounter (INDEPENDENT_AMBULATORY_CARE_PROVIDER_SITE_OTHER): Payer: Medicare Other | Admitting: *Deleted

## 2011-11-02 ENCOUNTER — Encounter: Payer: Self-pay | Admitting: Internal Medicine

## 2011-11-02 DIAGNOSIS — R0989 Other specified symptoms and signs involving the circulatory and respiratory systems: Secondary | ICD-10-CM

## 2011-11-02 LAB — PACEMAKER DEVICE OBSERVATION
AL IMPEDENCE PM: 340 Ohm
DEVICE MODEL PM: 303007

## 2011-11-30 ENCOUNTER — Encounter: Payer: Self-pay | Admitting: Internal Medicine

## 2011-11-30 ENCOUNTER — Ambulatory Visit (INDEPENDENT_AMBULATORY_CARE_PROVIDER_SITE_OTHER): Payer: Medicare Other | Admitting: *Deleted

## 2011-11-30 DIAGNOSIS — I495 Sick sinus syndrome: Secondary | ICD-10-CM

## 2011-11-30 DIAGNOSIS — I4891 Unspecified atrial fibrillation: Secondary | ICD-10-CM

## 2011-11-30 LAB — PACEMAKER DEVICE OBSERVATION: DEVICE MODEL PM: 303007

## 2011-11-30 NOTE — Progress Notes (Signed)
PPM interrogation only for battery check

## 2011-12-26 ENCOUNTER — Ambulatory Visit: Payer: Medicare Other | Admitting: Vascular Surgery

## 2012-01-01 ENCOUNTER — Ambulatory Visit (INDEPENDENT_AMBULATORY_CARE_PROVIDER_SITE_OTHER): Payer: Medicare Other | Admitting: *Deleted

## 2012-01-01 ENCOUNTER — Encounter: Payer: Self-pay | Admitting: Internal Medicine

## 2012-01-01 DIAGNOSIS — R0989 Other specified symptoms and signs involving the circulatory and respiratory systems: Secondary | ICD-10-CM

## 2012-01-01 DIAGNOSIS — R001 Bradycardia, unspecified: Secondary | ICD-10-CM

## 2012-01-01 LAB — PACEMAKER DEVICE OBSERVATION: DEVICE MODEL PM: 303007

## 2012-01-01 NOTE — Progress Notes (Signed)
Pacemaker check in clinic.  Battery check only.  Battery still with <0.5 years remaining.    Patient complaining of muscle cramps, concerned it may be Lipitor causing.    Advised to hold for 1-2 weeks to see if resolves.  Appt made with Dr Graciela Husbands for 4 weeks to review muscle aches and to recheck battery.

## 2012-01-03 ENCOUNTER — Encounter (HOSPITAL_COMMUNITY): Payer: Self-pay | Admitting: Emergency Medicine

## 2012-01-03 ENCOUNTER — Emergency Department (HOSPITAL_COMMUNITY)
Admission: EM | Admit: 2012-01-03 | Discharge: 2012-01-03 | Disposition: A | Discharge status: Home / Self Care | Payer: Medicare Other | Attending: Emergency Medicine | Admitting: Emergency Medicine

## 2012-01-03 DIAGNOSIS — I2510 Atherosclerotic heart disease of native coronary artery without angina pectoris: Secondary | ICD-10-CM | POA: Insufficient documentation

## 2012-01-03 DIAGNOSIS — J3489 Other specified disorders of nose and nasal sinuses: Secondary | ICD-10-CM | POA: Insufficient documentation

## 2012-01-03 DIAGNOSIS — Z79899 Other long term (current) drug therapy: Secondary | ICD-10-CM | POA: Insufficient documentation

## 2012-01-03 DIAGNOSIS — Z950 Presence of cardiac pacemaker: Secondary | ICD-10-CM | POA: Insufficient documentation

## 2012-01-03 DIAGNOSIS — R0981 Nasal congestion: Secondary | ICD-10-CM

## 2012-01-03 MED ORDER — DEXAMETHASONE SODIUM PHOSPHATE 10 MG/ML IJ SOLN
10.0000 mg | Freq: Once | INTRAMUSCULAR | Status: AC
  Administered 2012-01-03: 10 mg via INTRAMUSCULAR
  Filled 2012-01-03: qty 1

## 2012-01-03 NOTE — ED Provider Notes (Signed)
History     CSN: [id number]  Arrival date & time 01/03/12  0103   First MD Initiated Contact with Patient 01/03/12 [phone]      Chief Complaint  Patient presents with  . Nasal Congestion    (Consider location/radiation/quality/duration/timing/severity/associated sxs/prior treatment) HPI  71yoM h/o allergic rhinitis, chronic nasal inflammation pw nasal congestion. She states she has experience worsening of his chronic nasal congestion since yesterday. He took an Careers adviser as well as afrin. He also took one dose of steroid nasal spray. He states that he went to bed tonight and had nasal CPAP in place. He states that he was congested and that he couldn't breathe. He felt like he is suffocating at that time. He denies shortness of breath. He denies fevers, chills, chest pain. He states that usually would help him as a side of steroid given by your nose and throat who follows him closely for this problem and has discussed previously a turbinate reduction for him. States that he recently d/c tricor and lipitor after telling health care provider about gen body aches incl b/l thigh pain, and joint aches x 90 days which he feels is now improving.  Had cardiology appt yesterday for check of his pacemaker (q4week as "battery is running out and I'm not eligible for new one yet")  ED Notes, ED Provider Notes from 01/03/12 0000 to 01/03/12 01:25:07       Janese Banks, RN 01/03/2012 01:19      Pt is c/o difficulty breathing through nose for the past 12 hours, has problems with allergies, takes Allegra and Nasocort and rinses nostrils with saline solution, tried today but can't get it cleared up. Also took some Afrin, took it 3 days, last day yesterday (has been taking for 3 days and then stopping for 2 weeks alternating). Also stopped taking Tricor and Lipitor yesterday due to muscle aches, was told by a nurse at Dr Wilmon Arms office (when he was there for a check of his pacer) to stop them for a few weeks to see if  that helped the muscle aches. Wears a nose-mask for his sleep apnea and now that he can't breathe through his nose it feels like he's suffocating when he tried to sleep with the mask.                 Original note by Janese Banks, RN at 01/03/2012 01:17         Janese Banks, RN 01/03/2012 01:17      Pt is c/o difficulty breathing through nose for the past 12 hours, has problems with allergies, takes Allegra and Nasocort and rinses nostrils with saline solution, tried today but can't get it cleared up. Also took some Afrin, took it 3 days, last day yesterday (has been taking for 3 days and then stopping for 2 weeks alternating). Also stopped taking Tricor and Lipitor yesterday due to muscle aches, was told by a nurse at Dr Wilmon Arms office (when he was there for a check of his pacer) to stop them for a few weeks to see if that helped the muscle aches.                 Addendum to note by Janese Banks, RN at 01/03/2012 01:19         Geoffry Paradise, EMT 01/03/2012 01:11      Patient states that he is not short of breath, he just can't breathe through his nose.  Past Medical History  Diagnosis Date  . Hyperlipidemia   . Coronary artery disease     LAD stenting 2004 non-DES  . Atrial fibrillation     previous therapy includes amiodarone and sotalol  . Peripheral vascular disease   . Allergic rhinitis   . Depression   . OSA (obstructive sleep apnea)     CPAP  . Arthritis   . Bilateral popliteal artery aneurysm   . Colon cancer high risk     colonoscopy 2006 by Dr. Victorino Dike, next colonoscopy at 10 year interval  . Pacemaker     Past Surgical History  Procedure Date  . Inguinal hernia repair   . Stent (unspec)     x2  . 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 Right and left fem-pop bypass   . Hand surgery   . Left knee surgery     Family History  Problem Relation Age of Onset  . Diabetes Other     family hx  . Sleep apnea Other     family hx    History    Substance Use Topics  . Smoking status: Never Smoker   . Smokeless tobacco: Never Used  . Alcohol Use: No      Review of Systems  All other systems reviewed and are negative.   except as noted HPI   Allergies  Ace inhibitors; Amiodarone; Nasonex; and Niacin  Home Medications   Current Outpatient Rx  Name Route Sig Dispense Refill  . ASPIRIN 325 MG PO TABS Oral Take 325 mg by mouth daily.     . ATORVASTATIN CALCIUM 80 MG PO TABS  1/2 tab po qd 30 tablet 12  . DILTIAZEM HCL ER 120 MG PO CP24 Oral Take 1 capsule (120 mg total) by mouth daily. 90 capsule 3  . DOFETILIDE 500 MCG PO CAPS Oral Take 1 capsule (500 mcg total) by mouth 2 (two) times daily. 180 capsule 3  . FENOFIBRATE 145 MG PO TABS Oral Take 1 tablet (145 mg total) by mouth daily. 90 tablet 3  . POTASSIUM CHLORIDE CRYS ER 10 MEQ PO TBCR Oral Take 1 tablet (10 mEq total) by mouth 2 (two) times daily. 180 tablet 3  . TADALAFIL 20 MG PO TABS Oral Take 20 mg by mouth daily as needed. For ED    . TRIAMCINOLONE ACETONIDE 55 MCG/ACT NA INHA Nasal Place 1 spray into the nose 2 (two) times daily.     Marland Kitchen ZOLPIDEM TARTRATE 10 MG PO TABS Oral Take 2.5-5 mg by mouth at bedtime as needed.       BP 111/70  Pulse 59  Temp(Src) 96.8 F (36 C) (Oral)  Resp 16  SpO2 99%  Physical Exam  Nursing note and vitals reviewed. Constitutional: He is oriented to person, place, and time. He appears well-developed and well-nourished. No distress.  HENT:  Head: Atraumatic.  Mouth/Throat: Oropharynx is clear and moist. No oropharyngeal exudate.       Enlarged turbinates and narrow nasal passages b/l  Eyes: Conjunctivae are normal. Pupils are equal, round, and reactive to light.  Neck: Neck supple.  Cardiovascular: Normal rate, regular rhythm, normal heart sounds and intact distal pulses.  Exam reveals no gallop and no friction rub.   No murmur heard. Pulmonary/Chest: Effort normal. No respiratory distress. He has no wheezes. He has no  rales.  Abdominal: Soft. Bowel sounds are normal. There is no tenderness. There is no rebound and no guarding.  Musculoskeletal:  Normal range of motion. He exhibits no edema and no tenderness.  Neurological: He is alert and oriented to person, place, and time.  Skin: Skin is warm and dry.  Psychiatric: He has a normal mood and affect.    Date: 01/03/2012  Rate: 60  Rhythm: atrial paced rhythm  Old EKG Reviewed: unchanged  ED Course  Procedures (including critical care time)  Labs Reviewed - No data to display No results found.   1. Chronic nasal congestion     MDM  Requesting his typical steroid injection which he gets intermittently to help with chronic nasal congestion when afrin/nasal steroids do not help at home. Decadron ordered. HR noted to be < 60 (pacemaker set at 60bpm). Will obtain EKG to ensure capture. If ok, will be discharged home with ENT f/u. F/U with his PMD for chronic body aches/arthralgias which are improving from previous. No EMC precluding discharge at this time. Given Precautions for return. ENT/PMD f/u.         Forbes Cellar, MD 01/03/12 [phone]

## 2012-01-03 NOTE — ED Notes (Signed)
Patient states that he is not short of breath, he just can't breathe through his nose.

## 2012-01-03 NOTE — Discharge Instructions (Signed)
Take your medication as prescribed. Follow up with your ENT specialist as discussed.

## 2012-01-03 NOTE — ED Notes (Addendum)
Pt is c/o difficulty breathing through nose for the past 12 hours, has problems with allergies, takes Allegra and Nasocort and rinses nostrils with saline solution, tried today but can't get it cleared up. Also took some Afrin, took it 3 days, last day yesterday (has been taking for 3 days and then stopping for 2 weeks alternating). Also stopped taking Tricor and Lipitor yesterday due to muscle aches, was told by a nurse at Dr Wilmon Arms office (when he was there for a check of his pacer) to stop them for a few weeks to see if that helped the muscle aches. Wears a nose-mask for his sleep apnea and now that he can't breathe through his nose it feels like he's suffocating when he tried to sleep with the mask.

## 2012-01-04 ENCOUNTER — Other Ambulatory Visit: Payer: Self-pay | Admitting: *Deleted

## 2012-01-04 DIAGNOSIS — I4891 Unspecified atrial fibrillation: Secondary | ICD-10-CM

## 2012-01-04 DIAGNOSIS — E7800 Pure hypercholesterolemia, unspecified: Secondary | ICD-10-CM

## 2012-01-04 MED ORDER — DOFETILIDE 500 MCG PO CAPS: 500.0000 ug | ORAL_CAPSULE | Freq: Two times a day (BID) | ORAL | Status: DC

## 2012-01-04 MED ORDER — FENOFIBRATE 145 MG PO TABS: 145.0000 mg | ORAL_TABLET | Freq: Every day | ORAL | Status: DC

## 2012-01-23 ENCOUNTER — Other Ambulatory Visit: Payer: Self-pay | Admitting: *Deleted

## 2012-01-23 DIAGNOSIS — I70219 Atherosclerosis of native arteries of extremities with intermittent claudication, unspecified extremity: Secondary | ICD-10-CM

## 2012-01-23 DIAGNOSIS — I7092 Chronic total occlusion of artery of the extremities: Secondary | ICD-10-CM

## 2012-01-23 DIAGNOSIS — I714 Abdominal aortic aneurysm, without rupture: Secondary | ICD-10-CM

## 2012-01-23 DIAGNOSIS — I724 Aneurysm of artery of lower extremity: Secondary | ICD-10-CM

## 2012-01-29 ENCOUNTER — Encounter: Payer: Self-pay | Admitting: Vascular Surgery

## 2012-01-30 ENCOUNTER — Encounter (INDEPENDENT_AMBULATORY_CARE_PROVIDER_SITE_OTHER): Payer: Medicare Other | Admitting: *Deleted

## 2012-01-30 ENCOUNTER — Ambulatory Visit (INDEPENDENT_AMBULATORY_CARE_PROVIDER_SITE_OTHER): Payer: Medicare Other | Admitting: Vascular Surgery

## 2012-01-30 ENCOUNTER — Encounter: Payer: Self-pay | Admitting: Vascular Surgery

## 2012-01-30 VITALS — BP 125/70 | HR 94 | Resp 24 | Ht 75.0 in | Wt 208.0 lb

## 2012-01-30 DIAGNOSIS — I7092 Chronic total occlusion of artery of the extremities: Secondary | ICD-10-CM

## 2012-01-30 DIAGNOSIS — Z48812 Encounter for surgical aftercare following surgery on the circulatory system: Secondary | ICD-10-CM

## 2012-01-30 DIAGNOSIS — I724 Aneurysm of artery of lower extremity: Secondary | ICD-10-CM | POA: Insufficient documentation

## 2012-01-30 DIAGNOSIS — I70219 Atherosclerosis of native arteries of extremities with intermittent claudication, unspecified extremity: Secondary | ICD-10-CM

## 2012-01-30 NOTE — Progress Notes (Signed)
Subjective:     Patient ID: [name], male   DOB: June 13, 1941, 71 y.o.   MRN: [id number]  HPI this 71 year old male returns for continued followup regarding surgery performed by Dr. Madilyn Fireman in 2002 and 2003 for bilateral popliteal artery aneurysms. 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 He has no claudication symptoms being able to ambulate long distances. He has no history of gangrene, ischemia, cellulitis, or infection in either lower extremity. He had no history of abdominal aortic aneurysm but today it duplex scan was performed to rule that out because of his history of popliteal aneurysms he does have a pacemaker which is followed by Dr. Graciela Husbands and apparently will need a battery replacement side. He also has chronic atrial fibrillation. He takes one aspirin per day.  Past Medical History  Diagnosis Date  . Hyperlipidemia   . Coronary artery disease     LAD stenting 2004 non-DES  . Atrial fibrillation     previous therapy includes amiodarone and sotalol  . Peripheral vascular disease   . Allergic rhinitis   . Depression   . OSA (obstructive sleep apnea)     CPAP  . Arthritis   . Bilateral popliteal artery aneurysm   . Colon cancer high risk     colonoscopy 2006 by Dr. Victorino Dike, next colonoscopy at 10 year interval  . Pacemaker     History  Substance Use Topics  . Smoking status: Never Smoker   . Smokeless tobacco: Never Used  . Alcohol Use: No    Family History  Problem Relation Age of Onset  . Diabetes Other     family hx  . Sleep apnea Other     family hx  . Diabetes Mother   . Heart disease Mother   . Heart disease Father   . Heart attack Father   . Stroke Father   . Aneurysm Father     bilat pop art aneurysms    Allergies  Allergen Reactions  . Ace Inhibitors     REACTION: cough  . Amiodarone     REACTION: peripheral neuropathy  .  Mometasone Furoate     Causes nasal bleeding and nose bleeds  . Niacin     REACTION: irregular heartbeat    Current outpatient prescriptions:aspirin (BAYER ASPIRIN) 325 MG tablet, Take 325 mg by mouth daily. , Disp: , Rfl: ;  atorvastatin (LIPITOR) 80 MG tablet, 1/2 tab po qd, Disp: 30 tablet, Rfl: 12;  diltiazem (DILACOR XR) 120 MG 24 hr capsule, Take 1 capsule (120 mg total) by mouth daily., Disp: 90 capsule, Rfl: 3;  dofetilide (TIKOSYN) 500 MCG capsule, Take 1 capsule (500 mcg total) by mouth 2 (two) times daily., Disp: 180 capsule, Rfl: 3 fenofibrate (TRICOR) 145 MG tablet, Take 1 tablet (145 mg total) by mouth daily., Disp: 90 tablet, Rfl: 3;  fexofenadine (ALLEGRA) 180 MG tablet, Take 180 mg by mouth daily., Disp: , Rfl: ;  Magnesium 250 MG TABS, Take 1 tablet by mouth 2 (two) times daily., Disp: , Rfl: ;  Omega-3 Fatty Acids (FISH OIL) 1200 MG CAPS, Take 1 capsule by mouth 3 (three) times daily., Disp: , Rfl:  potassium chloride (KLOR-CON M10) 10 MEQ tablet, Take 1 tablet (10 mEq total) by mouth 2 (two) times daily., Disp: 180 tablet, Rfl: 3;  tadalafil (CIALIS) 20 MG tablet, Take 20 mg  by mouth daily as needed. For ED, Disp: , Rfl: ;  triamcinolone (NASACORT) 55 MCG/ACT nasal inhaler, Place 1 spray into the nose 2 (two) times daily. , Disp: , Rfl: ;  zolpidem (AMBIEN) 10 MG tablet, Take 2.5-5 mg by mouth at bedtime as needed. , Disp: , Rfl:  DISCONTD: triamcinolone (NASACORT AQ) 55 MCG/ACT nasal inhaler, Place 1 spray into the nose 2 (two) times daily., Disp: 3 Inhaler, Rfl: 3  BP 125/70  Pulse 94  Resp 24  Ht 6\' 3"  (1.905 m)  Wt 208 lb (94.348 kg)  BMI 26.00 kg/m2  Body mass index is 26.00 kg/(m^2).         Review of Systems denies chest pain, dyspnea on exertion, PND, orthopnea, claudication, chronic bronchitis, abdominal pain. All other systems negative and complete review of systems except for back pain     Objective:   Physical Exam blood pressure 120/70 heart rate 90  respirations 24 Gen.-alert and oriented x3 in no apparent distress HEENT normal for age Lungs no rhonchi or wheezing Cardiovascular regular rhythm no murmurs carotid pulses 3+ palpable no bruits audible Abdomen soft nontender no palpable masses Musculoskeletal free of  major deformities Skin clear -no rashes Neurologic normal Lower extremities 3+ femoral and dorsalis pedis pulses palpable bilaterally with no edema  Today I ordered a duplex scan of the abdominal aorta which revealed no evidence of abdominal aortic aneurysm Also ordered bilateral ABIs which are normal 1.24 on the right and 1.3 on the left Also ordered a scan of both superficial femoral to popliteal vein grafts. There is no evidence of high velocity or problems with either vein graft    Assessment:     Nicely functioning lower extremity vein grafts for bilateral popliteal aneurysms with no evidence of No evidence of abdominal aortic aneurysm by duplex scanning    Plan:     Return in one year with duplex scan bilateral saphenous vein grafts for popliteal aneurysms and bilateral ABIs If patient develops recurrent claudication symptoms he will be in touch with [place] prior to one year

## 2012-01-31 NOTE — Progress Notes (Signed)
Addended by: Sharee Pimple on: 01/31/2012 09:34 AM   Modules accepted: Orders

## 2012-02-06 ENCOUNTER — Encounter: Payer: Self-pay | Admitting: Internal Medicine

## 2012-02-06 ENCOUNTER — Other Ambulatory Visit: Payer: Medicare Other

## 2012-02-06 ENCOUNTER — Ambulatory Visit (INDEPENDENT_AMBULATORY_CARE_PROVIDER_SITE_OTHER): Payer: Medicare Other | Admitting: Internal Medicine

## 2012-02-06 VITALS — BP 111/68 | HR 60 | Ht 75.0 in | Wt 214.0 lb

## 2012-02-06 DIAGNOSIS — I4891 Unspecified atrial fibrillation: Secondary | ICD-10-CM

## 2012-02-06 DIAGNOSIS — N529 Male erectile dysfunction, unspecified: Secondary | ICD-10-CM

## 2012-02-06 DIAGNOSIS — Z950 Presence of cardiac pacemaker: Secondary | ICD-10-CM

## 2012-02-06 DIAGNOSIS — IMO0001 Reserved for inherently not codable concepts without codable children: Secondary | ICD-10-CM

## 2012-02-06 DIAGNOSIS — I2510 Atherosclerotic heart disease of native coronary artery without angina pectoris: Secondary | ICD-10-CM

## 2012-02-06 DIAGNOSIS — M791 Myalgia, unspecified site: Secondary | ICD-10-CM

## 2012-02-06 NOTE — Patient Instructions (Addendum)
Your physician recommends that you schedule a follow-up appointment in: 1 month with device clinic.  Your physician recommends that you continue on your current medications as directed. Please refer to the Current Medication list given to you today.  Your physician recommends that you return for FASTING lab work : tomorrow- Teacher, adult education.

## 2012-02-06 NOTE — Assessment & Plan Note (Signed)
The patient's device was interrogated.  The information was reviewed. No changes were made in the programming.    Approaching ERI 

## 2012-02-06 NOTE — Assessment & Plan Note (Signed)
I don't really have any other thoughts on this , and have thought that maybe he could benefit from urology appointment

## 2012-02-06 NOTE — Procedures (Unsigned)
DUPLEX ULTRASOUND OF ABDOMINAL AORTA  INDICATION:  Family history of AAA with personal history of popliteal aneurysm.  HISTORY: Diabetes:  No. Cardiac:  No. Hypertension:  No. Smoking:  No. Connective Tissue Disorder: Family History:  Yes. Previous Surgery:  No.  DUPLEX EXAM:         AP (cm)                   TRANSVERSE (cm) Proximal             1.92 cm                   2.03 cm Mid                  2.07 cm                   2.03 cm Distal               1.95 cm                   1.98 cm Right Iliac          1.42 cm                   1.40 cm Left Iliac           1.38 cm                   1.44 cm  PREVIOUS:  Date:  None.  AP:  TRANSVERSE:  IMPRESSION:  No evidence of ectasia or aneurysm of the abdominal aorta.  ___________________________________________ Quita Skye. Hart Rochester, M.D.  LT/MEDQ  D:  01/30/2012  T:  01/31/2012  Job:  [id number]

## 2012-02-06 NOTE — Assessment & Plan Note (Signed)
Atrial fibrillation is relatively quiet. We spent more than 25 minutes discussing anticoagulation issues including aspirin versus Coumadin versus NOACs  He will decrease his aspirin to 81 mg a day.

## 2012-02-06 NOTE — Procedures (Unsigned)
BYPASS GRAFT EVALUATION  INDICATION:  Follow up bilateral popliteal bypass grafts for aneurysm repair.  HISTORY: Diabetes:  No. Cardiac:  No. Hypertension:  No. Smoking:  No. Previous Surgery:  Right lower extremity graft placed 08/17/2002; left lower extremity graft placed 02/19/2002.  SINGLE LEVEL ARTERIAL EXAM                              RIGHT              LEFT Brachial: Anterior tibial: Posterior tibial: Peroneal: Ankle/brachial index:        1.36               1.43  TOE BRACHIAL INDEX RIGHT:  0.67  TOE BRACHIAL INDEX LEFT:  0.86  PREVIOUS ABI:  Date: 10/04/10  RIGHT:  1.24  LEFT:  1.3  LOWER EXTREMITY BYPASS GRAFT DUPLEX EXAM:  DUPLEX: 1. Widely patent bilateral popliteal artery bypass grafts without     evidence of stenosis. 2. Triphasic waveforms are noted throughout the graft.  IMPRESSION:  Widely patent bilateral popliteal artery bypass graft.  ___________________________________________ Quita Skye. Hart Rochester, M.D.  LT/MEDQ  D:  01/30/2012  T:  01/31/2012  Job:  [id number]

## 2012-02-06 NOTE — Progress Notes (Signed)
HPI  [name] is a 71 y.o. 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  .\  His pacemaker is approaching ERI.  He has occasional episodes of atrial fibrillation he notes his by detecting his pulse. He was prompted to check his by some sedating sensation which is hard to describe; but not always when he checks his pulse is irregular.  He continues to struggle with erectile dysfunction     Past Medical History  Diagnosis Date  . Hyperlipidemia   . Coronary artery disease     LAD stenting 2004 non-DES  . Atrial fibrillation     previous therapy includes amiodarone and sotalol  . Peripheral vascular disease   . Allergic rhinitis   . Depression   . OSA (obstructive sleep apnea)     CPAP  . Arthritis   . Bilateral popliteal artery aneurysm   . Colon cancer high risk     colonoscopy 2006 by Dr. Victorino Dike, next colonoscopy at 10 year interval  . Pacemaker     Past Surgical History  Procedure Date  . Inguinal hernia repair   . Stent (unspec)     x2  . 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 Right and left fem-pop bypass   . Hand surgery   . Left knee surgery     Current Outpatient Prescriptions  Medication Sig Dispense Refill  . aspirin (BAYER ASPIRIN) 325 MG tablet Take 325 mg by mouth daily.       Marland Kitchen diltiazem (DILACOR XR) 120 MG 24 hr capsule Take 1 capsule (120 mg total) by mouth daily.  90 capsule  3  . dofetilide (TIKOSYN) 500 MCG capsule Take 1 capsule (500 mcg total) by mouth 2 (two) times daily.  180 capsule  3  . fenofibrate (TRICOR) 145 MG tablet Take 1 tablet (145 mg total) by mouth daily.  90 tablet  3  . fexofenadine (ALLEGRA) 180 MG tablet Take 180 mg by mouth daily.      . Magnesium 250 MG TABS Take 1 tablet by mouth 2 (two) times daily.      . Omega-3 Fatty Acids (FISH OIL) 1200 MG CAPS Take 1 capsule by mouth 3 (three)  times daily.      . potassium chloride (KLOR-CON M10) 10 MEQ tablet Take 1 tablet (10 mEq total) by mouth 2 (two) times daily.  180 tablet  3  . tadalafil (CIALIS) 20 MG tablet Take 20 mg by mouth daily as needed. For ED      . triamcinolone (NASACORT) 55 MCG/ACT nasal inhaler Place 1 spray into the nose 2 (two) times daily.       Marland Kitchen zolpidem (AMBIEN) 10 MG tablet Take 2.5-5 mg by mouth at bedtime as needed.       Marland Kitchen DISCONTD: triamcinolone (NASACORT AQ) 55 MCG/ACT nasal inhaler Place 1 spray into the nose 2 (two) times daily.  3 Inhaler  3    Allergies  Allergen Reactions  . Ace Inhibitors     REACTION: cough  . Amiodarone     REACTION: peripheral neuropathy  . Mometasone Furoate     Causes nasal bleeding and nose bleeds  . Niacin     REACTION: irregular heartbeat    Review of Systems negative except from HPI and PMH  Physical Exam BP 111/68  Pulse 60  Ht 6\' 3"  (1.905 m)  Wt 214 lb (97.07 kg)  BMI 26.75 kg/m2 Well developed and well nourished in no acute distress HENT normal E scleral and icterus clear Neck Supple JVP flat; carotids brisk and full Clear to ausculation Regular rate and rhythm, no murmurs gallops or rub Soft with active bowel sounds No clubbing cyanosis none Edema Alert and oriented, grossly normal motor and sensory function Skin Warm and Dry  Atrial paced at 60 Intervals 0.16/0.08/0.47   Assessment and  Plan

## 2012-02-07 ENCOUNTER — Other Ambulatory Visit (INDEPENDENT_AMBULATORY_CARE_PROVIDER_SITE_OTHER): Payer: Medicare Other

## 2012-02-07 DIAGNOSIS — I2510 Atherosclerotic heart disease of native coronary artery without angina pectoris: Secondary | ICD-10-CM

## 2012-02-07 DIAGNOSIS — IMO0001 Reserved for inherently not codable concepts without codable children: Secondary | ICD-10-CM

## 2012-02-07 DIAGNOSIS — M791 Myalgia, unspecified site: Secondary | ICD-10-CM

## 2012-02-07 LAB — LIPID PANEL
LDL Cholesterol: 114 mg/dL — ABNORMAL HIGH (ref 0–99)
VLDL: 13 mg/dL (ref 0.0–40.0)

## 2012-02-07 LAB — SEDIMENTATION RATE: Sed Rate: 9 mm/hr (ref 0–22)

## 2012-02-07 LAB — C-REACTIVE PROTEIN: CRP: <1 mg/dL (ref 1–20)

## 2012-02-15 ENCOUNTER — Telehealth: Payer: Self-pay | Admitting: *Deleted

## 2012-02-15 DIAGNOSIS — I2510 Atherosclerotic heart disease of native coronary artery without angina pectoris: Secondary | ICD-10-CM

## 2012-02-15 NOTE — Telephone Encounter (Signed)
The patient called today for his lab results. Results given. He wanted to know what he should do about his statin drugs. He had stopped both lipitor and tricor about a month ago due to muscle pains. He thinks they may be some better, but he states it is hard to tell. I have advised him to go back on his lipitor 40 mg daily only for now and to see how he does over the next 1-2 weeks. He will call back and let us know.

## 2012-03-11 ENCOUNTER — Ambulatory Visit (INDEPENDENT_AMBULATORY_CARE_PROVIDER_SITE_OTHER): Payer: Medicare Other | Admitting: Cardiology

## 2012-03-11 ENCOUNTER — Encounter: Payer: Self-pay | Admitting: Cardiology

## 2012-03-11 VITALS — BP 124/68 | HR 64 | Ht 76.0 in | Wt 215.4 lb

## 2012-03-11 DIAGNOSIS — I2510 Atherosclerotic heart disease of native coronary artery without angina pectoris: Secondary | ICD-10-CM

## 2012-03-11 DIAGNOSIS — I4891 Unspecified atrial fibrillation: Secondary | ICD-10-CM

## 2012-03-11 DIAGNOSIS — Z950 Presence of cardiac pacemaker: Secondary | ICD-10-CM

## 2012-03-11 LAB — PACEMAKER DEVICE OBSERVATION
AL AMPLITUDE: 1.5 mv
RV LEAD AMPLITUDE: 2.6 mv
RV LEAD THRESHOLD: 0.8 V

## 2012-03-11 MED ORDER — ATORVASTATIN CALCIUM 80 MG PO TABS: 80.0000 mg | ORAL_TABLET | Freq: Every day | ORAL | Status: DC

## 2012-03-11 NOTE — Patient Instructions (Signed)
Your physician recommends that you schedule a follow-up appointment in: 1 month with device clinic 

## 2012-03-11 NOTE — Progress Notes (Signed)
Device check only

## 2012-03-11 NOTE — Progress Notes (Deleted)
ELECTROPHYSIOLOGY OFFICE NOTE  Patient ID: EUCLID CASSETTA MRN: [id number], DOB/AGE: [birth year]   Date of Visit: 03/11/2012  Primary Physician: Oliver Barre, MD Primary Electrophysiologist:  Sherryl Manges, MD Reason for Visit: Device follow-up  History of Present Illness***  Past Medical History  Diagnosis Date  . Hyperlipidemia   . Coronary artery disease     LAD stenting 2004 non-DES  . Atrial fibrillation     previous therapy includes amiodarone and sotalol  . Peripheral vascular disease   . Allergic rhinitis   . Depression   . OSA (obstructive sleep apnea)     CPAP  . Arthritis   . Bilateral popliteal artery aneurysm   . Colon cancer high risk     colonoscopy 2006 by Dr. Victorino Dike, next colonoscopy at 10 year interval  . Pacemaker     Past Surgical History  Procedure Date  . Inguinal hernia repair   . Stent (unspec)     x2  . 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 Right and left fem-pop bypass   . Hand surgery   . Left knee surgery     Allergies/Intolerances Allergies  Allergen Reactions  . Ace Inhibitors     REACTION: cough  . Amiodarone     REACTION: peripheral neuropathy  . Mometasone Furoate     Causes nasal bleeding and nose bleeds  . Niacin     REACTION: irregular heartbeat   Current Home Medications Current Outpatient Prescriptions  Medication Sig Dispense Refill  . aspirin (BAYER ASPIRIN) 325 MG tablet Take 325 mg by mouth daily.       Marland Kitchen atorvastatin (LIPITOR) 40 MG tablet Take 1 tablet (40 mg total) by mouth daily.      Marland Kitchen diltiazem (DILACOR XR) 120 MG 24 hr capsule Take 1 capsule (120 mg total) by mouth daily.  90 capsule  3  . dofetilide (TIKOSYN) 500 MCG capsule Take 1 capsule (500 mcg total) by mouth 2 (two) times daily.  180 capsule  3  . fenofibrate (TRICOR) 145 MG tablet Take 1 tablet (145 mg total) by mouth daily.  90 tablet  3  . fexofenadine (ALLEGRA) 180 MG tablet Take 180 mg by mouth daily.      . Magnesium 250 MG TABS Take 1  tablet by mouth 2 (two) times daily.      . Omega-3 Fatty Acids (FISH OIL) 1200 MG CAPS Take 1 capsule by mouth 3 (three) times daily.      . potassium chloride (KLOR-CON M10) 10 MEQ tablet Take 1 tablet (10 mEq total) by mouth 2 (two) times daily.  180 tablet  3  . tadalafil (CIALIS) 20 MG tablet Take 20 mg by mouth daily as needed. For ED      . triamcinolone (NASACORT) 55 MCG/ACT nasal inhaler Place 1 spray into the nose 2 (two) times daily.       Marland Kitchen zolpidem (AMBIEN) 10 MG tablet Take 2.5-5 mg by mouth at bedtime as needed.        Social History History   Social History  . Marital Status: Married    Spouse Name: N/A    Number of Children: N/A  . Years of Education: N/A   Occupational History  . Not on file.   Social History Main Topics  . Smoking status: Never Smoker   . Smokeless tobacco: Never Used  . Alcohol Use: No  . Drug Use: No  . Sexually Active: Not on file  Other Topics Concern  . 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     Review of Systems General:  No chills, fever, night sweats or weight changes Cardiovascular:  No chest pain, dyspnea on exertion, edema, orthopnea, palpitations, paroxysmal nocturnal dyspnea Dermatological: No rash, lesions or masses Respiratory: No cough, dyspnea Urologic: No hematuria, dysuria Abdominal:   No nausea, vomiting, diarrhea, bright red blood per rectum, melena, or hematemesis Neurologic:  No visual changes, weakness, changes in mental status All other systems reviewed and are otherwise negative except as noted above.  Physical Exam There were no vitals taken for this visit.  General: Well developed, well appearing 71 year old male in no acute distress. HEENT: Normocephalic, atraumatic. EOMs intact. Sclera nonicteric. Oropharynx clear.  Neck: Supple without bruits. No JVD. Lungs:  Respirations regular and unlabored, CTA bilaterally. No wheezes, rales or rhonchi.  Heart:  RRR. S1, S2 present. No murmurs, rub, S3 or S4. Abdomen: Soft, non-tender, non-distended. BS present x 4 quadrants. No hepatosplenomegaly.  Extremities: No clubbing, cyanosis or edema. DP/PT/Radials 2+ and equal bilaterally. Psych: Normal affect. Neuro: Alert and oriented X 3. Moves all extremities spontaneously.   Diagnostics 12-lead ECG *** Device interrogation ***  Assessment and Plan ***   Signed, Andren Bethea, PA-C 03/11/2012, 11:01 AM

## 2012-05-02 ENCOUNTER — Encounter: Payer: Medicare Other | Admitting: *Deleted

## 2012-05-02 ENCOUNTER — Encounter: Payer: Self-pay | Admitting: Internal Medicine

## 2012-05-02 ENCOUNTER — Ambulatory Visit (INDEPENDENT_AMBULATORY_CARE_PROVIDER_SITE_OTHER): Payer: Medicare Other | Admitting: Internal Medicine

## 2012-05-02 VITALS — BP 118/71 | HR 59 | Resp 18 | Ht 75.0 in | Wt 214.1 lb

## 2012-05-02 DIAGNOSIS — I4891 Unspecified atrial fibrillation: Secondary | ICD-10-CM

## 2012-05-02 DIAGNOSIS — Z950 Presence of cardiac pacemaker: Secondary | ICD-10-CM

## 2012-05-02 NOTE — Assessment & Plan Note (Signed)
No significant atrial fib

## 2012-05-02 NOTE — Progress Notes (Signed)
HPI  [name] is a 71 y.o. 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  .\  His pacemaker has reached ERI   He has occasional episodes of atrial fibrillation he notes his by detecting his pulse. He was prompted to check his by some sedating sensation which is hard to describe; but not always when he checks his pulse is irregular.  He continues to struggle with erectile dysfunction     Past Medical History  Diagnosis Date  . Hyperlipidemia   . Coronary artery disease     LAD stenting 2004 non-DES  . Atrial fibrillation     previous therapy includes amiodarone and sotalol  . Peripheral vascular disease   . Allergic rhinitis   . Depression   . OSA (obstructive sleep apnea)     CPAP  . Arthritis   . Bilateral popliteal artery aneurysm   . Colon cancer high risk     colonoscopy 2006 by Dr. Victorino Dike, next colonoscopy at 10 year interval  . Pacemaker     Past Surgical History  Procedure Date  . Inguinal hernia repair   . Stent (unspec)     x2  . 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 Right and left fem-pop bypass   . Hand surgery   . Left knee surgery     Current Outpatient Prescriptions  Medication Sig Dispense Refill  . aspirin (BAYER ASPIRIN) 325 MG tablet Take 325 mg by mouth daily. Takes 1/4 tablet      . atorvastatin (LIPITOR) 80 MG tablet Take 40 mg by mouth daily.      Marland Kitchen diltiazem (DILACOR XR) 120 MG 24 hr capsule Take 1 capsule (120 mg total) by mouth daily.  90 capsule  3  . dofetilide (TIKOSYN) 500 MCG capsule Take 1 capsule (500 mcg total) by mouth 2 (two) times daily.  180 capsule  3  . fenofibrate (TRICOR) 145 MG tablet Take 1 tablet (145 mg total) by mouth daily.  90 tablet  3  . fexofenadine (ALLEGRA) 180 MG tablet Take 180 mg by mouth daily.      . Magnesium 250 MG TABS Take 1 tablet by mouth 2 (two) times daily.       . Omega-3 Fatty Acids (FISH OIL) 1200 MG CAPS Take 1 capsule by mouth daily.       . potassium chloride (KLOR-CON M10) 10 MEQ tablet Take 1 tablet (10 mEq total) by mouth 2 (two) times daily.  180 tablet  3  . tadalafil (CIALIS) 20 MG tablet Take 20 mg by mouth daily as needed. For ED      . triamcinolone (NASACORT) 55 MCG/ACT nasal inhaler Place 1 spray into the nose 2 (two) times daily.       Marland Kitchen zolpidem (AMBIEN) 10 MG tablet Take 2.5-5 mg by mouth at bedtime as needed.         Allergies  Allergen Reactions  . Ace Inhibitors     REACTION: cough  . Amiodarone     REACTION: peripheral neuropathy  . Mometasone Furoate     Causes nasal bleeding and nose bleeds  . Niacin     REACTION: irregular heartbeat    Review of Systems negative except from HPI and PMH  Physical Exam BP 118/71  Pulse 59  Resp 18  Ht 6\' 3"  (1.905 m)  Wt 214 lb 1.9  oz (97.124 kg)  BMI 26.76 kg/m2  SpO2 92% Well developed and well nourished in no acute distress HENT normal E scleral and icterus clear Neck Supple JVP flat; carotids brisk and full Clear to ausculation Regular rate and rhythm, no murmurs gallops or rub Soft with active bowel sounds No clubbing cyanosis none Edema Alert and oriented, grossly normal motor and sensory function Skin Warm and Dry     Assessment and  Plan

## 2012-05-02 NOTE — Assessment & Plan Note (Signed)
Device has reached ERI  We had a 45 min discussion regarding device choices. He asked if we could implant a Biotronik device based on information with the CLS system back according to a trial called Burden II which showed a decrease burden of PACs and short runs of atrial tachycardia but not anything longer than 24 hours. I told him I wasn't familiar with this trial. I've also told him that I was unlikely to be in impressed given that I have never heard anything in the last 4 years about utility of this out rhythm. I also told him though that is his choice if  he would like it done I am glad to implanted.

## 2012-05-29 ENCOUNTER — Telehealth: Payer: Self-pay | Admitting: Internal Medicine

## 2012-05-29 DIAGNOSIS — I495 Sick sinus syndrome: Secondary | ICD-10-CM

## 2012-05-29 NOTE — Telephone Encounter (Signed)
I spoke with the patient. He states that he has thought about his options for device generator replacement. He is in favor of the Biotronik device as he is impressed with information he has read about the effectiveness of potentially reducing his a-fib occurrence. He wanted to make Dr. Graciela Husbands aware of this and make sure he did not have any objections to this decision. If not, he is ready to schedule device generator replacement. I will forward to Dr. Graciela Husbands for review. I will call the patient back tomorrow to try to schedule his procedure for either 10/2 or 10/9.

## 2012-05-29 NOTE — Telephone Encounter (Signed)
Pt has decided to have pacer replaced with the biotronic device, pls call pt , he wants to discuss reasons why

## 2012-05-29 NOTE — Telephone Encounter (Signed)
I'm glad to place a Biotronik pacemaker on his behalf. I will be reviewing the data next week at the [facility]

## 2012-05-30 ENCOUNTER — Encounter: Payer: Self-pay | Admitting: *Deleted

## 2012-05-30 NOTE — Telephone Encounter (Signed)
I spoke with the patient. He is scheduled for his PPM gen change on 06/17/12 at 12:00pm. He will switch from a [place] device to a Biotronik device. He will go to the [place] lab on 10/3 for labs.

## 2012-06-04 ENCOUNTER — Encounter (HOSPITAL_COMMUNITY): Payer: Self-pay | Admitting: Pharmacy Technician

## 2012-06-13 ENCOUNTER — Ambulatory Visit: Payer: Medicare Other

## 2012-06-13 DIAGNOSIS — I495 Sick sinus syndrome: Secondary | ICD-10-CM

## 2012-06-13 LAB — BASIC METABOLIC PANEL
GFR: 58.59 mL/min — ABNORMAL LOW (ref >60.00)
Potassium: 4.8 mEq/L (ref 3.5–5.1)
Sodium: 144 mEq/L (ref 135–145)

## 2012-06-13 LAB — CBC WITH DIFFERENTIAL/PLATELET
Eosinophils Relative: 4.5 % (ref 0.0–5.0)
HCT: 44 % (ref 39.0–52.0)
Hemoglobin: 14.8 g/dL (ref 13.0–17.0)
Lymphs Abs: 1.6 10*3/uL (ref 0.7–4.0)
Monocytes Relative: 12 % (ref 3.0–12.0)
Neutro Abs: 2.5 10*3/uL (ref 1.4–7.7)
WBC: 5 10*3/uL (ref 4.5–10.5)

## 2012-06-13 LAB — PROTIME-INR: Prothrombin Time: 11.8 s (ref 10.2–12.4)

## 2012-06-17 ENCOUNTER — Ambulatory Visit (HOSPITAL_COMMUNITY)
Admission: RE | Admit: 2012-06-17 | Discharge: 2012-06-17 | Disposition: A | Discharge status: Home / Self Care | Payer: Medicare Other | Source: Ambulatory Visit | Attending: Internal Medicine | Admitting: Internal Medicine

## 2012-06-17 ENCOUNTER — Encounter (HOSPITAL_COMMUNITY)
Admission: RE | Disposition: A | Discharge status: Home / Self Care | Payer: Self-pay | Source: Ambulatory Visit | Attending: Internal Medicine

## 2012-06-17 DIAGNOSIS — I4891 Unspecified atrial fibrillation: Secondary | ICD-10-CM

## 2012-06-17 DIAGNOSIS — Z45018 Encounter for adjustment and management of other part of cardiac pacemaker: Secondary | ICD-10-CM | POA: Insufficient documentation

## 2012-06-17 DIAGNOSIS — I495 Sick sinus syndrome: Secondary | ICD-10-CM

## 2012-06-17 HISTORY — PX: PERMANENT PACEMAKER GENERATOR CHANGE: SHX6022

## 2012-06-17 LAB — SURGICAL PCR SCREEN
MRSA, PCR: NEGATIVE
Staphylococcus aureus: POSITIVE — AB

## 2012-06-17 SURGERY — PERMANENT PACEMAKER GENERATOR CHANGE
Anesthesia: LOCAL

## 2012-06-17 MED ORDER — MUPIROCIN 2 % EX OINT
TOPICAL_OINTMENT | Freq: Two times a day (BID) | CUTANEOUS | Status: DC
  Filled 2012-06-17: qty 22

## 2012-06-17 MED ORDER — SODIUM CHLORIDE 0.45 % IV SOLN
INTRAVENOUS | Status: DC
  Administered 2012-06-17: 12:00:00 via INTRAVENOUS

## 2012-06-17 MED ORDER — SODIUM CHLORIDE 0.9 % IJ SOLN: 3.0000 mL | Freq: Two times a day (BID) | INTRAMUSCULAR | Status: DC

## 2012-06-17 MED ORDER — MUPIROCIN 2 % EX OINT
TOPICAL_OINTMENT | CUTANEOUS | Status: AC
  Filled 2012-06-17: qty 22

## 2012-06-17 MED ORDER — SODIUM CHLORIDE 0.9 % IR SOLN
80.0000 mg | Status: DC
  Filled 2012-06-17: qty 2

## 2012-06-17 MED ORDER — CEFAZOLIN SODIUM-DEXTROSE 2-3 GM-% IV SOLR
2.0000 g | INTRAVENOUS | Status: DC
  Filled 2012-06-17: qty 50

## 2012-06-17 MED ORDER — SODIUM CHLORIDE 0.9 % IV SOLN: INTRAVENOUS | Status: DC

## 2012-06-17 MED ORDER — SODIUM CHLORIDE 0.9 % IV SOLN: 250.0000 mL | INTRAVENOUS | Status: DC

## 2012-06-17 MED ORDER — FENTANYL CITRATE 0.05 MG/ML IJ SOLN
INTRAMUSCULAR | Status: AC
  Filled 2012-06-17: qty 2

## 2012-06-17 MED ORDER — LIDOCAINE HCL (PF) 1 % IJ SOLN
INTRAMUSCULAR | Status: AC
  Filled 2012-06-17: qty 60

## 2012-06-17 MED ORDER — SODIUM CHLORIDE 0.9 % IJ SOLN: 3.0000 mL | INTRAMUSCULAR | Status: DC | PRN

## 2012-06-17 MED ORDER — CHLORHEXIDINE GLUCONATE 4 % EX LIQD
60.0000 mL | Freq: Once | CUTANEOUS | Status: DC
  Filled 2012-06-17: qty 60

## 2012-06-17 MED ORDER — ONDANSETRON HCL 4 MG/2ML IJ SOLN: 4.0000 mg | Freq: Four times a day (QID) | INTRAMUSCULAR | Status: DC | PRN

## 2012-06-17 MED ORDER — MIDAZOLAM HCL 5 MG/5ML IJ SOLN
INTRAMUSCULAR | Status: AC
  Filled 2012-06-17: qty 5

## 2012-06-17 MED ORDER — ACETAMINOPHEN 325 MG PO TABS: 325.0000 mg | ORAL_TABLET | ORAL | Status: DC | PRN

## 2012-06-17 NOTE — CV Procedure (Signed)
Preoperative diagnosis sinus node dysfunction Postoperative diagnosis same/  Procedure: Generator replacement    Following informed consent the patient was brought to the electrophysiology laboratory in place of the fluoroscopic table in the supine position after routine prep and drape lidocaine was infiltrated in the region of the previous incision and carried down to later the device pocket using sharp dissection and electrocautery. The pocket was opened the device was freed up and was explanted.  Interrogation of the previously implanted ventricular lead Intermedics 432-10  demonstrated an R wave of 3.5 millivolts., and impedance of 330ohms, and a pacing threshold of 0.9 volts at 4 msec.    The previously implanted atrial lead intermedics 423-04 added P-wave amplitude of 1.7 milllivolts  and impedance of  351 ohms, and a pacing threshold of 0.6volts at 4 milliseconds.I marked the v lead with a tie prior to its removal from the old device  The leads were inspected. The leads were then attached to a biotronik Evia pulse generator, serial number [id number].    The pocket was irrigated with antibiotic containing saline solution hemostasis was assured and the leads and the device were placed in the pocket. The wound is then closed in 3 layers in normal fashion.  The patient tolerated the procedure without apparent complication.  Sherryl Manges

## 2012-06-17 NOTE — H&P (Signed)
Patient Care Team: [name], MD as PCP - General Duke Salvia, MD (Cardiology)   HPI  [name] is a 71 y.o. 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 .\  His pacemaker has reached ERI  He has occasional episodes of atrial fibrillation he notes his by detecting his pulse. The patient denies chest pain, shortness of breath, nocturnal dyspnea, orthopnea or peripheral edema.  There have been no palpitations, lightheadedness or syncope. A little bit more tired  He has done some research at home and would like to have his device replaced with Biotronik  Past Medical History  Diagnosis Date  . Hyperlipidemia   . Coronary artery disease     LAD stenting 2004 non-DES  . Atrial fibrillation     previous therapy includes amiodarone and sotalol  . Peripheral vascular disease   . Allergic rhinitis   . Depression   . OSA (obstructive sleep apnea)     CPAP  . Arthritis   . Bilateral popliteal artery aneurysm   . Colon cancer high risk     colonoscopy 2006 by Dr. Victorino Dike, next colonoscopy at 10 year interval  . Pacemaker     Past Surgical History  Procedure Date  . Inguinal hernia repair   . Stent (unspec)     x2  . 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 Right and left fem-pop bypass   . Hand surgery   . Left knee surgery     Current Facility-Administered Medications  Medication Dose Route Frequency Provider Last Rate Last Dose  . 0.45 % sodium chloride infusion   Intravenous Continuous Duke Salvia, MD      . 0.9 %  sodium chloride infusion  250 mL Intravenous Continuous Duke Salvia, MD      . ceFAZolin (ANCEF) IVPB 2 g/50 mL premix  2 g Intravenous On Call Duke Salvia, MD      . chlorhexidine (HIBICLENS) 4 % liquid 4 application  60 mL Topical Once Duke Salvia, MD      . gentamicin (GARAMYCIN) 80 mg in sodium chloride  irrigation 0.9 % 500 mL irrigation  80 mg Irrigation On Call Duke Salvia, MD      . sodium chloride 0.9 % injection 3 mL  3 mL Intravenous Q12H Duke Salvia, MD      . sodium chloride 0.9 % injection 3 mL  3 mL Intravenous PRN Duke Salvia, MD        Allergies  Allergen Reactions  . Ace Inhibitors     REACTION: cough  . Amiodarone     REACTION: peripheral neuropathy  . Mometasone Furoate Other (See Comments)    (Nasonex) Causes nasal bleeding and nose bleeds  . Niacin     REACTION: irregular heartbeat    Review of Systems negative except from HPI and PMH  Physical Exam BP 110/65  Pulse 60  Temp 98 F (36.7 C) (Oral)  Resp 18  Ht 6\' 3"  (1.905 m)  Wt 203 lb (92.08 kg)  BMI 25.37 kg/m2  SpO2 96% Well developed and well nourished in no acute distress HENT normal E scleral and icterus clear Neck Supple JVP flat; carotids brisk and full Clear to ausculation Regular rate and rhythm, no murmurs gallops or rub Soft with active bowel sounds No clubbing cyanosis nmone Edema Alert and  oriented, grossly normal motor and sensory function Skin Warm and Dry    Assessment and  Plan  For device generaotr replacement The benefits and risks were reviewed including but not limited to death,  perforation, infection, lead dislodgement and device malfunction.  The patient understands agrees and is willing to proceed.   Will also contact Dr Shelle Iron re issue of OSA CPAP

## 2012-06-19 ENCOUNTER — Encounter: Payer: Self-pay | Admitting: *Deleted

## 2012-06-21 ENCOUNTER — Other Ambulatory Visit (INDEPENDENT_AMBULATORY_CARE_PROVIDER_SITE_OTHER): Payer: Medicare Other

## 2012-06-21 ENCOUNTER — Ambulatory Visit (INDEPENDENT_AMBULATORY_CARE_PROVIDER_SITE_OTHER): Payer: Medicare Other | Admitting: *Deleted

## 2012-06-21 ENCOUNTER — Ambulatory Visit (INDEPENDENT_AMBULATORY_CARE_PROVIDER_SITE_OTHER)
Admission: RE | Admit: 2012-06-21 | Discharge: 2012-06-21 | Disposition: A | Discharge status: Home / Self Care | Payer: Medicare Other | Source: Ambulatory Visit | Attending: Internal Medicine | Admitting: Internal Medicine

## 2012-06-21 ENCOUNTER — Ambulatory Visit (INDEPENDENT_AMBULATORY_CARE_PROVIDER_SITE_OTHER): Payer: Medicare Other | Admitting: Internal Medicine

## 2012-06-21 ENCOUNTER — Encounter: Payer: Self-pay | Admitting: Internal Medicine

## 2012-06-21 VITALS — BP 104/78 | HR 108 | Temp 97.8°F | Ht 75.0 in | Wt 212.4 lb

## 2012-06-21 DIAGNOSIS — R040 Epistaxis: Secondary | ICD-10-CM

## 2012-06-21 DIAGNOSIS — Z125 Encounter for screening for malignant neoplasm of prostate: Secondary | ICD-10-CM

## 2012-06-21 DIAGNOSIS — J309 Allergic rhinitis, unspecified: Secondary | ICD-10-CM

## 2012-06-21 DIAGNOSIS — Z0000 Encounter for general adult medical examination without abnormal findings: Secondary | ICD-10-CM

## 2012-06-21 DIAGNOSIS — J019 Acute sinusitis, unspecified: Secondary | ICD-10-CM

## 2012-06-21 DIAGNOSIS — I495 Sick sinus syndrome: Secondary | ICD-10-CM

## 2012-06-21 DIAGNOSIS — Z23 Encounter for immunization: Secondary | ICD-10-CM

## 2012-06-21 DIAGNOSIS — Z0001 Encounter for general adult medical examination with abnormal findings: Secondary | ICD-10-CM | POA: Insufficient documentation

## 2012-06-21 DIAGNOSIS — Z79899 Other long term (current) drug therapy: Secondary | ICD-10-CM

## 2012-06-21 DIAGNOSIS — J329 Chronic sinusitis, unspecified: Secondary | ICD-10-CM

## 2012-06-21 LAB — URINALYSIS, ROUTINE W REFLEX MICROSCOPIC
Bilirubin Urine: NEGATIVE
Ketones, ur: NEGATIVE
Total Protein, Urine: NEGATIVE
Urine Glucose: NEGATIVE

## 2012-06-21 LAB — LIPID PANEL
HDL: 41.5 mg/dL (ref >39.00)
LDL Cholesterol: 67 mg/dL (ref 0–99)
Total CHOL/HDL Ratio: 3
Triglycerides: 55 mg/dL (ref 0.0–149.0)

## 2012-06-21 LAB — CBC WITH DIFFERENTIAL/PLATELET
Basophils Relative: 1 % (ref 0.0–3.0)
Eosinophils Absolute: 0.3 10*3/uL (ref 0.0–0.7)
HCT: 39.7 % (ref 39.0–52.0)
Hemoglobin: 13.3 g/dL (ref 13.0–17.0)
Lymphs Abs: 1.2 10*3/uL (ref 0.7–4.0)
MCHC: 33.6 g/dL (ref 30.0–36.0)
MCV: 96.2 fl (ref 78.0–100.0)
Monocytes Absolute: 0.5 10*3/uL (ref 0.1–1.0)
Neutro Abs: 2.2 10*3/uL (ref 1.4–7.7)
RBC: 4.12 Mil/uL — ABNORMAL LOW (ref 4.22–5.81)
RDW: 14.2 % (ref 11.5–14.6)

## 2012-06-21 LAB — HEPATIC FUNCTION PANEL
Alkaline Phosphatase: 44 U/L (ref 39–117)
Bilirubin, Direct: 0.2 mg/dL (ref 0.0–0.3)
Total Bilirubin: 0.9 mg/dL (ref 0.3–1.2)

## 2012-06-21 LAB — BASIC METABOLIC PANEL
Calcium: 9.6 mg/dL (ref 8.4–10.5)
Creatinine, Ser: 1.2 mg/dL (ref 0.4–1.5)
Sodium: 141 mEq/L (ref 135–145)

## 2012-06-21 LAB — PSA: PSA: 0.39 ng/mL (ref 0.10–4.00)

## 2012-06-21 MED ORDER — METHYLPREDNISOLONE ACETATE 80 MG/ML IJ SUSP
120.0000 mg | Freq: Once | INTRAMUSCULAR | Status: AC
  Administered 2012-06-21: 120 mg via INTRAMUSCULAR

## 2012-06-21 MED ORDER — TRIAMCINOLONE ACETONIDE(NASAL) 55 MCG/ACT NA INHA: 1.0000 | Freq: Two times a day (BID) | NASAL | Status: DC

## 2012-06-21 MED ORDER — LEVOFLOXACIN 250 MG PO TABS: 250.0000 mg | ORAL_TABLET | Freq: Every day | ORAL | Status: DC

## 2012-06-21 NOTE — Patient Instructions (Addendum)
You had the flu shot today, and the steroid shot Take all new medications as prescribed  - the Levaquin antibiotic (sent to [facility] [employer]) You can also take Mucinex (or it's generic off brand) for congestion You will be contacted regarding the referral for: CT sinus, and Dr Gregary Cromer Your Nasacort was refilled as well Please have the pharmacy call with any other refills you may need. Please go to LAB in the Basement for the blood and/or urine tests to be done today You will be contacted by phone if any changes need to be made immediately.  Otherwise, you will receive a letter about your results with an explanation. Please remember to sign up for My Chart at your earliest convenience, as this will be important to you in the future with finding out test results. Please return in 1 year for your yearly visit, or sooner if needed, with Lab testing done 3-5 days before

## 2012-06-21 NOTE — Progress Notes (Signed)
Subjective:    Patient ID: [name], male    DOB: 1941-02-28, 71 y.o.   MRN: [id number]  HPI  Here for wellness and f/u;  Overall doing ok;  Pt denies CP, worsening SOB, DOE, wheezing, orthopnea, PND, worsening LE edema, palpitations, dizziness or syncope.  Pt denies neurological change such as new Headache, facial or extremity weakness.  Pt denies polydipsia, polyuria, or low sugar symptoms. Pt states overall good compliance with treatment and medications, good tolerability, and trying to follow lower cholesterol diet.  Pt denies worsening depressive symptoms, suicidal ideation or panic. No fever, wt loss, night sweats, loss of appetite, or other constitutional symptoms.  Pt states good ability with ADL's, low fall risk, home safety reviewed and adequate, no significant changes in hearing or vision, and occasionally active with exercise.  LUE shows some interosseous muscle atrophy after seing NS, no specific tx and pain resolved, related to cervical spine.    Here also with 3 days acute onset fever, facial pain, pressure, general weakness and malaise, and greenish d/c, is 3rd episode in 12 mo, hard to breathe out of right nares, has recurrent bleed from the left.  Does have several wks ongoing nasal allergy symptoms with clear congestion, itch and sneeze. Past Medical History  Diagnosis Date  . Hyperlipidemia   . Coronary artery disease     LAD stenting 2004 non-DES  . Atrial fibrillation     previous therapy includes amiodarone and sotalol  . Peripheral vascular disease   . Allergic rhinitis   . Depression   . OSA (obstructive sleep apnea)     CPAP  . Arthritis   . Bilateral popliteal artery aneurysm   . Colon cancer high risk     colonoscopy 2006 by Dr. Victorino Dike, next colonoscopy at 10 year interval  . Pacemaker    Past Surgical History  Procedure Date  . Inguinal hernia repair   . Stent (unspec)     x2  . 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 Right and left fem-pop  bypass   . Hand surgery   . Left knee surgery     reports that he has never smoked. He has never used smokeless tobacco. He reports that he does not drink alcohol or use illicit drugs. family history includes Aneurysm in his father; Diabetes in his mother and other; Heart attack in his father; Heart disease in his father and mother; Sleep apnea in his other; and Stroke in his father. Allergies  Allergen Reactions  . Ace Inhibitors     REACTION: cough  . Amiodarone     REACTION: peripheral neuropathy  . Mometasone Furoate Other (See Comments)    (Nasonex) Causes nasal bleeding and nose bleeds  . Niacin     REACTION: irregular heartbeat   Current Outpatient Prescriptions on File Prior to Visit  Medication Sig Dispense Refill  . aspirin (BAYER ASPIRIN) 325 MG tablet Take 88 mg by mouth daily. Takes 1/4 tablet      . atorvastatin (LIPITOR) 80 MG tablet Take 40 mg by mouth daily.      Marland Kitchen CALCIUM PO Take 600 mg by mouth 2 (two) times daily.      . Cyanocobalamin (VITAMIN B 12 PO) Take 1 tablet by mouth daily.      Marland Kitchen diltiazem (DILACOR XR) 120 MG 24 hr capsule Take 1 capsule (120 mg total) by mouth daily.  90 capsule  3  . dofetilide (TIKOSYN) 500 MCG capsule  Take 500 mcg by mouth 2 (two) times daily.      . fenofibrate (TRICOR) 145 MG tablet Take 145 mg by mouth daily.      . fexofenadine (ALLEGRA) 180 MG tablet Take 180 mg by mouth daily.      Marland Kitchen FOLIC ACID PO Take 1 tablet by mouth daily.      Marland Kitchen GLUCOSAMINE CHONDROITIN COMPLX PO Take 1 tablet by mouth daily.      Marland Kitchen ibuprofen (ADVIL,MOTRIN) 200 MG tablet Take 400 mg by mouth every 6 (six) hours as needed. For pain      . Lysine 500 MG CAPS Take 1 capsule by mouth 3 (three) times daily.      . Magnesium 250 MG TABS Take 1 tablet by mouth 2 (two) times daily.      . Omega-3 Fatty Acids (FISH OIL) 1200 MG CAPS Take 1 capsule by mouth daily.       . potassium chloride (KLOR-CON M10) 10 MEQ tablet Take 1 tablet (10 mEq total) by mouth 2 (two)  times daily.  180 tablet  3  . tadalafil (CIALIS) 20 MG tablet Take 20 mg by mouth daily as needed. For ED      . triamcinolone (NASACORT) 55 MCG/ACT nasal inhaler Place 1 spray into the nose 2 (two) times daily.  3 Inhaler  3  . zolpidem (AMBIEN) 10 MG tablet Take 2.5-5 mg by mouth at bedtime as needed. For sleep       Review of Systems Review of Systems  Constitutional: Negative for diaphoresis, activity change, appetite change and unexpected weight change.  HENT: Negative for hearing loss, ear pain, facial swelling, mouth sores and neck stiffness.   Eyes: Negative for pain, redness and visual disturbance.  Respiratory: Negative for shortness of breath and wheezing.   Cardiovascular: Negative for chest pain and palpitations.  Gastrointestinal: Negative for diarrhea, blood in stool, abdominal distention and rectal pain.  Genitourinary: Negative for hematuria, flank pain and decreased urine volume.  Musculoskeletal: Negative for myalgias and joint swelling.  Skin: Negative for color change and wound.  Neurological: Negative for syncope and numbness.  Hematological: Negative for adenopathy.  Psychiatric/Behavioral: Negative for hallucinations, self-injury, decreased concentration and agitation.      Objective:   Physical Exam BP 104/78  Pulse 108  Temp 97.8 F (36.6 C) (Oral)  Ht 6\' 3"  (1.905 m)  Wt 212 lb 6 oz (96.333 kg)  BMI 26.55 kg/m2  SpO2 96% Physical Exam  VS noted, mild ill Constitutional: Pt is oriented to person, place, and time. Appears well-developed and well-nourished.  HENT:  Head: Normocephalic and atraumatic.  Right Ear: External ear normal.  Left Ear: External ear normal.  Nose: Nose normal.  Mouth/Throat: Oropharynx is clear and moist. Bilat tm's mild erythema.  Sinus tender.  Pharynx mild erythema  Eyes: Conjunctivae and EOM are normal. Pupils are equal, round, and reactive to light.  Neck: Normal range of motion. Neck supple. No JVD present. No tracheal  deviation present.  Cardiovascular: Normal rate, regular rhythm, normal heart sounds and intact distal pulses.   Pulmonary/Chest: Effort normal and breath sounds normal.  Abdominal: Soft. Bowel sounds are normal. There is no tenderness.  Musculoskeletal: Normal range of motion. Exhibits no edema.  Lymphadenopathy:  Has no cervical adenopathy.  Neurological: Pt is alert and oriented to person, place, and time. Pt has normal reflexes. No cranial nerve deficit.  Skin: Skin is warm and dry. No rash noted.  Psychiatric:  Has  normal  mood and affect. Behavior is normal.     Assessment & Plan:

## 2012-06-22 ENCOUNTER — Encounter: Payer: Self-pay | Admitting: Internal Medicine

## 2012-06-22 DIAGNOSIS — J309 Allergic rhinitis, unspecified: Secondary | ICD-10-CM | POA: Insufficient documentation

## 2012-06-22 HISTORY — DX: Allergic rhinitis, unspecified: J30.9

## 2012-06-22 NOTE — Assessment & Plan Note (Signed)
For nasacort asd,  to f/u any worsening symptoms or concerns °

## 2012-06-22 NOTE — Assessment & Plan Note (Signed)
For ent as above

## 2012-06-22 NOTE — Assessment & Plan Note (Signed)

## 2012-06-22 NOTE — Assessment & Plan Note (Signed)
For depomeddrol IM, CTsinus, refer ent per pt request

## 2012-06-22 NOTE — Assessment & Plan Note (Signed)
Mild to mod, for antibx course,  to f/u any worsening symptoms or concerns 

## 2012-06-23 ENCOUNTER — Encounter: Payer: Self-pay | Admitting: Internal Medicine

## 2012-06-24 NOTE — Telephone Encounter (Signed)
Advised pt that 25% of healthy people carry staph aureus in their nares.

## 2012-06-24 NOTE — Telephone Encounter (Signed)
Fowarded to Dr.Klein's nurse

## 2012-06-24 NOTE — Telephone Encounter (Signed)
error 

## 2012-07-01 ENCOUNTER — Ambulatory Visit (INDEPENDENT_AMBULATORY_CARE_PROVIDER_SITE_OTHER): Payer: Medicare Other | Admitting: *Deleted

## 2012-07-01 ENCOUNTER — Encounter: Payer: Self-pay | Admitting: Internal Medicine

## 2012-07-01 DIAGNOSIS — Z950 Presence of cardiac pacemaker: Secondary | ICD-10-CM

## 2012-07-01 DIAGNOSIS — I4891 Unspecified atrial fibrillation: Secondary | ICD-10-CM

## 2012-07-01 LAB — PACEMAKER DEVICE OBSERVATION
AL IMPEDENCE PM: 351 Ohm
AL THRESHOLD: 0.6 V
RV LEAD AMPLITUDE: 4.1 mv
RV LEAD THRESHOLD: 0.9 V

## 2012-07-01 NOTE — Progress Notes (Signed)
Wound check pacer in clinic  

## 2012-07-11 ENCOUNTER — Encounter: Payer: Self-pay | Admitting: Internal Medicine

## 2012-07-25 NOTE — Progress Notes (Signed)
This encounter was created in error - please disregard.

## 2012-09-12 ENCOUNTER — Ambulatory Visit (INDEPENDENT_AMBULATORY_CARE_PROVIDER_SITE_OTHER): Payer: Medicare Other | Admitting: Internal Medicine

## 2012-09-12 ENCOUNTER — Encounter: Payer: Self-pay | Admitting: Internal Medicine

## 2012-09-12 VITALS — BP 112/70 | HR 88 | Temp 97.2°F | Ht 75.0 in | Wt 210.0 lb

## 2012-09-12 DIAGNOSIS — Z0000 Encounter for general adult medical examination without abnormal findings: Secondary | ICD-10-CM

## 2012-09-12 DIAGNOSIS — L03039 Cellulitis of unspecified toe: Secondary | ICD-10-CM | POA: Insufficient documentation

## 2012-09-12 DIAGNOSIS — E785 Hyperlipidemia, unspecified: Secondary | ICD-10-CM

## 2012-09-12 DIAGNOSIS — I739 Peripheral vascular disease, unspecified: Secondary | ICD-10-CM

## 2012-09-12 MED ORDER — AMOXICILLIN-POT CLAVULANATE 875-125 MG PO TABS: 1.0000 | ORAL_TABLET | Freq: Two times a day (BID) | ORAL | Status: DC

## 2012-09-12 MED ORDER — TADALAFIL 20 MG PO TABS: 20.0000 mg | ORAL_TABLET | Freq: Every day | ORAL | Status: DC | PRN

## 2012-09-12 NOTE — Patient Instructions (Addendum)
Take all new medications as prescribed - the longer course of augmentin Continue all other medications as before, except OK to stop the cephalexin Please have the pharmacy call with any other refills you may need. Please consider seeing podiatry for any recurring pain to the foot or infections

## 2012-09-12 NOTE — Progress Notes (Signed)
Subjective:    Patient ID: [name], male    DOB: [birth year], 72 y.o.   MRN: [id number]  HPI  Here with 4 wks onset left great toe pain with red, tender, swelling and ? Pus material to dorsal great toe; overall started a few days prior to travel to [place] with a lot of walking and abrasion to toe from shoe better with neosporin; did coincidently see ENT and tx with augmentin course for acute sinus infection that helped toe as well, but seemed to worsen again late last wk causing him to go to [facility] over [facility] - tx with keflex course but does not seem to be improving as quickly and defintiely as with the augmentin.  No fever, red streaks, ulcers, chills or drainage though dorsal still tender, red, "spongy". Is s/p LE vascular surgury but no claudication Past Medical History  Diagnosis Date  . Hyperlipidemia   . Coronary artery disease     LAD stenting 2004 non-DES  . Atrial fibrillation     previous therapy includes amiodarone and sotalol  . Peripheral vascular disease   . Allergic rhinitis   . Depression   . OSA (obstructive sleep apnea)     CPAP  . Arthritis   . Bilateral popliteal artery aneurysm   . Colon cancer high risk     colonoscopy 2006 by Dr. Victorino Dike, next colonoscopy at 10 year interval  . Pacemaker   . Allergic rhinitis, cause unspecified 06/22/2012   Past Surgical History  Procedure Date  . Inguinal hernia repair   . Stent (unspec)     x2  . 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 Right and left fem-pop bypass   . Hand surgery   . Left knee surgery     reports that he has never smoked. He has never used smokeless tobacco. He reports that he does not drink alcohol or use illicit drugs. family history includes Aneurysm in his father; Diabetes in his mother and other; Heart attack in his father; Heart disease in his father and mother; Sleep apnea in his other; and Stroke in his father. Allergies  Allergen Reactions  . Ace Inhibitors    REACTION: cough  . Amiodarone     REACTION: peripheral neuropathy  . Mometasone Furoate Other (See Comments)    (Nasonex) Causes nasal bleeding and nose bleeds  . Niacin     REACTION: irregular heartbeat   Current Outpatient Prescriptions on File Prior to Visit  Medication Sig Dispense Refill  . aspirin (BAYER ASPIRIN) 325 MG tablet Take 88 mg by mouth daily. Takes 1/4 tablet      . atorvastatin (LIPITOR) 80 MG tablet Take 40 mg by mouth daily.      Marland Kitchen CALCIUM PO Take 600 mg by mouth 2 (two) times daily.      . Cyanocobalamin (VITAMIN B 12 PO) Take 1 tablet by mouth daily.      Marland Kitchen diltiazem (DILACOR XR) 120 MG 24 hr capsule Take 1 capsule (120 mg total) by mouth daily.  90 capsule  3  . dofetilide (TIKOSYN) 500 MCG capsule Take 500 mcg by mouth 2 (two) times daily.      . fenofibrate (TRICOR) 145 MG tablet Take 145 mg by mouth daily.      . fexofenadine (ALLEGRA) 180 MG tablet Take 180 mg by mouth daily.      Marland Kitchen FOLIC ACID PO Take 1 tablet by mouth daily.      Marland Kitchen  GLUCOSAMINE CHONDROITIN COMPLX PO Take 1 tablet by mouth daily.      Marland Kitchen ibuprofen (ADVIL,MOTRIN) 200 MG tablet Take 400 mg by mouth every 6 (six) hours as needed. For pain      . levofloxacin (LEVAQUIN) 250 MG tablet Take 1 tablet (250 mg total) by mouth daily.  14 tablet  0  . Lysine 500 MG CAPS Take 1 capsule by mouth 3 (three) times daily.      . Magnesium 250 MG TABS Take 1 tablet by mouth 2 (two) times daily.      . Omega-3 Fatty Acids (FISH OIL) 1200 MG CAPS Take 1 capsule by mouth daily.       . potassium chloride (KLOR-CON M10) 10 MEQ tablet Take 1 tablet (10 mEq total) by mouth 2 (two) times daily.  180 tablet  3  . tadalafil (CIALIS) 20 MG tablet Take 1 tablet (20 mg total) by mouth daily as needed. For ED  30 tablet  3  . triamcinolone (NASACORT) 55 MCG/ACT nasal inhaler Place 1 spray into the nose 2 (two) times daily.  3 Inhaler  3  . zolpidem (AMBIEN) 10 MG tablet Take 2.5-5 mg by mouth at bedtime as needed. For sleep        Review of Systems  Constitutional: Negative for diaphoresis and unexpected weight change.  HENT: Negative for tinnitus.   Eyes: Negative for photophobia and visual disturbance.  Respiratory: Negative for choking and stridor.   Gastrointestinal: Negative for vomiting and blood in stool.  Genitourinary: Negative for hematuria and decreased urine volume.  Musculoskeletal: Negative for gait problem.  Skin: Negative for color change and wound.  Neurological: Negative for tremors and numbness.  Psychiatric/Behavioral: Negative for decreased concentration. The patient is not hyperactive.       Objective:   Physical Exam BP 112/70  Pulse 88  Temp 97.2 F (36.2 C) (Oral)  Ht 6\' 3"  (1.905 m)  Wt 210 lb (95.255 kg)  BMI 26.25 kg/m2  SpO2 97% Physical Exam  VS noted Constitutional: Pt appears well-developed and well-nourished.  HENT: Head: Normocephalic.  Right Ear: External ear normal.  Left Ear: External ear normal.  Eyes: Conjunctivae and EOM are normal. Pupils are equal, round, and reactive to light.  Neck: Normal range of motion. Neck supple.  Cardiovascular: Normal rate and regular rhythm.   Pulmonary/Chest: Effort normal and breath sounds normal.  Neurological: Pt is alert. Not confused  Skin: left great dorsal toe with 1-2 cm area red/tender/swelling wtihout red streak, ulcer, fluctuance or drianage Psychiatric: Pt behavior is normal. Thought content normal.     Assessment & Plan:

## 2012-09-12 NOTE — Assessment & Plan Note (Signed)
stable overall by hx and exam, most recent data reviewed with pt, and pt to continue medical treatment as before Lab Results  Component Value Date   WBC 4.2* 06/21/2012   HGB 13.3 06/21/2012   HCT 39.7 06/21/2012   PLT 183.0 06/21/2012   GLUCOSE 91 06/21/2012   CHOL 119 06/21/2012   TRIG 55.0 06/21/2012   HDL 41.50 06/21/2012   LDLCALC 67 06/21/2012   ALT 22 06/21/2012   AST 27 06/21/2012   NA 141 06/21/2012   K 4.6 06/21/2012   CL 106 06/21/2012   CREATININE 1.2 06/21/2012   BUN 23 06/21/2012   CO2 28 06/21/2012   TSH 1.34 06/21/2012   PSA 0.39 06/21/2012   INR 1.1* 06/13/2012

## 2012-09-12 NOTE — Assessment & Plan Note (Signed)
Ok to change to augmentin for 14 days,  to f/u any worsening symptoms or concerns

## 2012-09-12 NOTE — Assessment & Plan Note (Signed)
stable overall by hx and exam, most recent data reviewed with pt, and pt to continue medical treatment as before Lab Results  Component Value Date   LDLCALC 67 06/21/2012

## 2012-09-23 ENCOUNTER — Telehealth: Payer: Self-pay | Admitting: Internal Medicine

## 2012-09-23 ENCOUNTER — Encounter: Payer: Self-pay | Admitting: Internal Medicine

## 2012-09-23 MED ORDER — TADALAFIL 20 MG PO TABS: 20.0000 mg | ORAL_TABLET | Freq: Every day | ORAL | Status: DC | PRN

## 2012-09-23 MED ORDER — AMOXICILLIN-POT CLAVULANATE 875-125 MG PO TABS: 1.0000 | ORAL_TABLET | Freq: Two times a day (BID) | ORAL | Status: DC

## 2012-09-23 NOTE — Telephone Encounter (Signed)
augmentin x 1 wk sent per emr  cialis done hardcopy per pt request, pt notified to pickup at office per [facility]

## 2012-09-23 NOTE — Telephone Encounter (Signed)
Put hardcopy in cabnet

## 2012-10-01 ENCOUNTER — Encounter: Payer: Self-pay | Admitting: Internal Medicine

## 2012-10-01 ENCOUNTER — Other Ambulatory Visit: Payer: Self-pay | Admitting: Internal Medicine

## 2012-10-01 DIAGNOSIS — M79676 Pain in unspecified toe(s): Secondary | ICD-10-CM

## 2012-10-01 MED ORDER — DOXYCYCLINE HYCLATE 100 MG PO TABS: 100.0000 mg | ORAL_TABLET | Freq: Two times a day (BID) | ORAL | Status: DC

## 2012-10-01 NOTE — Telephone Encounter (Signed)
See mychart email.  

## 2012-10-07 ENCOUNTER — Ambulatory Visit (INDEPENDENT_AMBULATORY_CARE_PROVIDER_SITE_OTHER): Payer: Medicare Other | Admitting: Internal Medicine

## 2012-10-07 ENCOUNTER — Encounter: Payer: Self-pay | Admitting: Internal Medicine

## 2012-10-07 VITALS — BP 118/68 | HR 60 | Ht 75.0 in | Wt 210.0 lb

## 2012-10-07 DIAGNOSIS — I4891 Unspecified atrial fibrillation: Secondary | ICD-10-CM

## 2012-10-07 DIAGNOSIS — Z950 Presence of cardiac pacemaker: Secondary | ICD-10-CM

## 2012-10-07 LAB — PACEMAKER DEVICE OBSERVATION
AL IMPEDENCE PM: 351 Ohm
AL THRESHOLD: 0.5 V
RV LEAD AMPLITUDE: 3.8 mv
RV LEAD IMPEDENCE PM: 312 Ohm
VENTRICULAR PACING PM: 6

## 2012-10-07 NOTE — Assessment & Plan Note (Signed)
Atrial fibrillation burden is 14%. We'll continue on his dofetilide. Laboratories were normal in October. We will check in March. QTc interval is normal

## 2012-10-07 NOTE — Assessment & Plan Note (Signed)
The patient's device was interrogated.  The information was reviewed. No changes were made in the programming.    

## 2012-10-07 NOTE — Patient Instructions (Addendum)
Your physician wants you to follow-up in: June with Dr Graciela Husbands. You will receive a reminder letter in the mail two months in advance. If you don't receive a letter, please call our office to schedule the follow-up appointment.  Your physician recommends that you return for lab work in: March - BMP and Magnesium

## 2012-10-07 NOTE — Progress Notes (Signed)
Patient Care Team: [name], MD as PCP - General Duke Salvia, MD (Cardiology)   HPI  [name] is a 72 y.o. 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 .\  His pacemaker reached ERI 8/13 and he underwent generator replacement with a Biotronik device because of his CLS function  He takes dofetilide for his atrial fibrillation. He is doing better following reprogramming of his CLS function.         Past Medical History  Diagnosis Date  . Hyperlipidemia   . Coronary artery disease     LAD stenting 2004 non-DES  . Atrial fibrillation     previous therapy includes amiodarone and sotalol  . Peripheral vascular disease   . Allergic rhinitis   . Depression   . OSA (obstructive sleep apnea)     CPAP  . Arthritis   . Bilateral popliteal artery aneurysm   . Colon cancer high risk     colonoscopy 2006 by Dr. Victorino Dike, next colonoscopy at 10 year interval  . Pacemaker   . Allergic rhinitis, cause unspecified 06/22/2012    Past Surgical History  Procedure Date  . Inguinal hernia repair   . Stent (unspec)     x2  . 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 Right and left fem-pop bypass   . Hand surgery   . Left knee surgery     Current Outpatient Prescriptions  Medication Sig Dispense Refill  . amoxicillin-clavulanate (AUGMENTIN) 875-125 MG per tablet Take 1 tablet by mouth 2 (two) times daily.  14 tablet  0  . aspirin (BAYER ASPIRIN) 325 MG tablet Take 88 mg by mouth daily. Takes 1/4 tablet      . atorvastatin (LIPITOR) 80 MG tablet Take 40 mg by mouth daily.      Marland Kitchen CALCIUM PO Take 600 mg by mouth 2 (two) times daily.      . Cyanocobalamin (VITAMIN B 12 PO) Take 1 tablet by mouth daily.      Marland Kitchen diltiazem (DILACOR XR) 120 MG 24 hr capsule Take 120 mg by mouth daily.      Marland Kitchen dofetilide (TIKOSYN) 500 MCG capsule Take 500 mcg by mouth 2  (two) times daily.      Marland Kitchen doxycycline (VIBRA-TABS) 100 MG tablet Take 1 tablet (100 mg total) by mouth 2 (two) times daily.  20 tablet  0  . fenofibrate (TRICOR) 145 MG tablet Take 145 mg by mouth daily.      . fexofenadine (ALLEGRA) 180 MG tablet Take 180 mg by mouth daily.      Marland Kitchen FOLIC ACID PO Take 1 tablet by mouth daily.      Marland Kitchen GLUCOSAMINE CHONDROITIN COMPLX PO Take 1 tablet by mouth daily.      Marland Kitchen ibuprofen (ADVIL,MOTRIN) 200 MG tablet Take 400 mg by mouth every 6 (six) hours as needed. For pain      . levofloxacin (LEVAQUIN) 250 MG tablet Take 1 tablet (250 mg total) by mouth daily.  14 tablet  0  . Lysine 500 MG CAPS Take 1 capsule by mouth 3 (three) times daily.      . Magnesium 250 MG TABS Take 1 tablet by mouth 2 (two) times daily.      . Omega-3 Fatty Acids (FISH OIL) 1200 MG CAPS Take 1 capsule by mouth daily.       Marland Kitchen  potassium chloride (KLOR-CON M10) 10 MEQ tablet Take 1 tablet (10 mEq total) by mouth 2 (two) times daily.  180 tablet  3  . tadalafil (CIALIS) 20 MG tablet Take 1 tablet (20 mg total) by mouth daily as needed. For ED  30 tablet  3  . triamcinolone (NASACORT) 55 MCG/ACT nasal inhaler Place 1 spray into the nose 2 (two) times daily.  3 Inhaler  3  . zolpidem (AMBIEN) 10 MG tablet Take 2.5-5 mg by mouth at bedtime as needed. For sleep        Allergies  Allergen Reactions  . Ace Inhibitors     REACTION: cough  . Amiodarone     REACTION: peripheral neuropathy  . Mometasone Furoate Other (See Comments)    (Nasonex) Causes nasal bleeding and nose bleeds  . Niacin     REACTION: irregular heartbeat    Review of Systems negative except from HPI and PMH  Physical Exam BP 118/68  Pulse 60  Ht 6\' 3"  (1.905 m)  Wt 210 lb (95.255 kg)  BMI 26.25 kg/m2  SpO2 97% Well developed and well nourished in no acute distress HENT normal E scleral and icterus clear Neck Supple JVP flat;  Clear to ausculation  Regular rate and rhythm, no murmurs gallops or rub Soft with  active bowel sounds No clubbing cyanosis none Edema Alert and oriented, grossly normal motor and sensory function Skin Warm and Dry  ECG demonstrates atrial paced rhythm at 60 Intervals 16/09/45 Axis is 55   Assessment and  Plan

## 2012-11-21 ENCOUNTER — Other Ambulatory Visit: Payer: Self-pay | Admitting: Emergency Medicine

## 2012-11-21 DIAGNOSIS — I495 Sick sinus syndrome: Secondary | ICD-10-CM

## 2012-11-21 MED ORDER — POTASSIUM CHLORIDE CRYS ER 10 MEQ PO TBCR: 10.0000 meq | EXTENDED_RELEASE_TABLET | Freq: Two times a day (BID) | ORAL | Status: DC

## 2012-12-09 ENCOUNTER — Other Ambulatory Visit: Payer: Self-pay | Admitting: Internal Medicine

## 2012-12-10 ENCOUNTER — Telehealth: Payer: Self-pay | Admitting: *Deleted

## 2012-12-10 DIAGNOSIS — I4891 Unspecified atrial fibrillation: Secondary | ICD-10-CM

## 2012-12-10 MED ORDER — DOFETILIDE 500 MCG PO CAPS: 500.0000 ug | ORAL_CAPSULE | Freq: Two times a day (BID) | ORAL | Status: DC

## 2012-12-10 NOTE — Telephone Encounter (Signed)
(  patient e-mail ) ----- Message ----- From: Ileene Rubens Sent: 12/09/2012 4:49 PM To: [facility] Triage Subject: Medication Renewal Request Original authorizing provider: Sherryl Manges, MD Ileene Rubens would like a refill of the following medications: dofetilide (TIKOSYN) 500 MCG capsule Sherryl Manges, MD] Preferred pharmacy: [facility] - [place], [place] - [address] Comment  RX refilled- will make patient aware through "my Chart "

## 2013-01-16 ENCOUNTER — Encounter: Payer: Self-pay | Admitting: Internal Medicine

## 2013-01-16 ENCOUNTER — Other Ambulatory Visit: Payer: Self-pay | Admitting: Internal Medicine

## 2013-01-16 ENCOUNTER — Other Ambulatory Visit: Payer: Self-pay | Admitting: *Deleted

## 2013-01-16 MED ORDER — DILTIAZEM HCL ER 120 MG PO CP24: 120.0000 mg | ORAL_CAPSULE | Freq: Every day | ORAL | Status: DC

## 2013-01-16 NOTE — Telephone Encounter (Signed)
Diltiazem refill sent to pharmacy.

## 2013-01-28 ENCOUNTER — Encounter (INDEPENDENT_AMBULATORY_CARE_PROVIDER_SITE_OTHER): Payer: Medicare Other | Admitting: Vascular Surgery

## 2013-01-28 ENCOUNTER — Ambulatory Visit: Payer: Medicare Other | Admitting: Neurosurgery

## 2013-01-28 DIAGNOSIS — Z48812 Encounter for surgical aftercare following surgery on the circulatory system: Secondary | ICD-10-CM

## 2013-01-28 DIAGNOSIS — I739 Peripheral vascular disease, unspecified: Secondary | ICD-10-CM

## 2013-01-28 DIAGNOSIS — I724 Aneurysm of artery of lower extremity: Secondary | ICD-10-CM

## 2013-01-29 ENCOUNTER — Other Ambulatory Visit: Payer: Self-pay | Admitting: *Deleted

## 2013-01-29 ENCOUNTER — Encounter: Payer: Self-pay | Admitting: Vascular Surgery

## 2013-01-29 DIAGNOSIS — Z48812 Encounter for surgical aftercare following surgery on the circulatory system: Secondary | ICD-10-CM

## 2013-01-29 DIAGNOSIS — I739 Peripheral vascular disease, unspecified: Secondary | ICD-10-CM

## 2013-03-18 ENCOUNTER — Encounter: Payer: Self-pay | Admitting: Internal Medicine

## 2013-03-18 MED ORDER — FENOFIBRATE 145 MG PO TABS: 145.0000 mg | ORAL_TABLET | Freq: Every day | ORAL | Status: DC

## 2013-03-31 ENCOUNTER — Ambulatory Visit (INDEPENDENT_AMBULATORY_CARE_PROVIDER_SITE_OTHER): Payer: Medicare Other | Admitting: Internal Medicine

## 2013-03-31 ENCOUNTER — Encounter: Payer: Self-pay | Admitting: Internal Medicine

## 2013-03-31 VITALS — BP 132/72 | HR 70 | Temp 98.1°F | Wt 214.0 lb

## 2013-03-31 DIAGNOSIS — S91009A Unspecified open wound, unspecified ankle, initial encounter: Secondary | ICD-10-CM

## 2013-03-31 DIAGNOSIS — L03119 Cellulitis of unspecified part of limb: Secondary | ICD-10-CM

## 2013-03-31 DIAGNOSIS — L03116 Cellulitis of left lower limb: Secondary | ICD-10-CM

## 2013-03-31 DIAGNOSIS — S81802A Unspecified open wound, left lower leg, initial encounter: Secondary | ICD-10-CM

## 2013-03-31 DIAGNOSIS — I739 Peripheral vascular disease, unspecified: Secondary | ICD-10-CM

## 2013-03-31 MED ORDER — METHYLPREDNISOLONE ACETATE 80 MG/ML IJ SUSP
80.0000 mg | Freq: Once | INTRAMUSCULAR | Status: AC
  Administered 2013-03-31: 80 mg via INTRAMUSCULAR

## 2013-03-31 MED ORDER — CEPHALEXIN 500 MG PO CAPS: 500.0000 mg | ORAL_CAPSULE | Freq: Three times a day (TID) | ORAL | Status: DC

## 2013-03-31 NOTE — Progress Notes (Signed)
Subjective:    Patient ID: [name], male    DOB: 31-Dec-1940, 72 y.o.   MRN: [id number]  HPI  Pt presents to the clinic today with c/o a rash on his left lower leg and foot. This started about 1-2 months ago. He bumped his leg on a table around this time. The healing has been slow. 2 days ago he noticed redness, swelling and tenderness around the area where he bumped his leg. He denies fever. He has not noticed drainage from the area. He has been putting neosporin on it but is has not helped. He has no history of diabetes but he does have a history of PVD.  Review of Systems  Past Medical History  Diagnosis Date  . Hyperlipidemia   . Coronary artery disease     LAD stenting 2004 non-DES  . Atrial fibrillation     previous therapy includes amiodarone and sotalol  . Peripheral vascular disease   . Allergic rhinitis   . Depression   . OSA (obstructive sleep apnea)     CPAP  . Arthritis   . Bilateral popliteal artery aneurysm   . Colon cancer high risk     colonoscopy 2006 by Dr. Victorino Dike, next colonoscopy at 10 year interval  . Pacemaker   . Allergic rhinitis, cause unspecified 06/22/2012    Current Outpatient Prescriptions  Medication Sig Dispense Refill  . amoxicillin-clavulanate (AUGMENTIN) 875-125 MG per tablet Take 1 tablet by mouth 2 (two) times daily.  14 tablet  0  . aspirin (BAYER ASPIRIN) 325 MG tablet Take 88 mg by mouth daily. Takes 1/4 tablet      . atorvastatin (LIPITOR) 80 MG tablet Take 40 mg by mouth daily.      Marland Kitchen CALCIUM PO Take 600 mg by mouth 2 (two) times daily.      . Cyanocobalamin (VITAMIN B 12 PO) Take 1 tablet by mouth daily.      Marland Kitchen diltiazem (DILACOR XR) 120 MG 24 hr capsule Take 1 capsule (120 mg total) by mouth daily.  90 capsule  3  . dofetilide (TIKOSYN) 500 MCG capsule Take 1 capsule (500 mcg total) by mouth 2 (two) times daily.  180 capsule  3  . doxycycline (VIBRA-TABS) 100 MG tablet Take 1 tablet (100 mg total) by mouth 2 (two) times  daily.  20 tablet  0  . fenofibrate (TRICOR) 145 MG tablet Take 145 mg by mouth daily.      . fenofibrate (TRICOR) 145 MG tablet Take 1 tablet (145 mg total) by mouth daily.  90 tablet  3  . fexofenadine (ALLEGRA) 180 MG tablet Take 180 mg by mouth daily.      Marland Kitchen FOLIC ACID PO Take 1 tablet by mouth daily.      Marland Kitchen GLUCOSAMINE CHONDROITIN COMPLX PO Take 1 tablet by mouth daily.      Marland Kitchen ibuprofen (ADVIL,MOTRIN) 200 MG tablet Take 400 mg by mouth every 6 (six) hours as needed. For pain      . levofloxacin (LEVAQUIN) 250 MG tablet Take 1 tablet (250 mg total) by mouth daily.  14 tablet  0  . Lysine 500 MG CAPS Take 1 capsule by mouth 3 (three) times daily.      . Magnesium 250 MG TABS Take 1 tablet by mouth 2 (two) times daily.      . Omega-3 Fatty Acids (FISH OIL) 1200 MG CAPS Take 1 capsule by mouth daily.       . potassium chloride (  KLOR-CON M10) 10 MEQ tablet Take 1 tablet (10 mEq total) by mouth 2 (two) times daily.  180 tablet  3  . tadalafil (CIALIS) 20 MG tablet Take 1 tablet (20 mg total) by mouth daily as needed. For ED  30 tablet  3  . triamcinolone (NASACORT) 55 MCG/ACT nasal inhaler Place 1 spray into the nose 2 (two) times daily.  3 Inhaler  3  . zolpidem (AMBIEN) 10 MG tablet Take 2.5-5 mg by mouth at bedtime as needed. For sleep       No current facility-administered medications for this visit.    Allergies  Allergen Reactions  . Ace Inhibitors     REACTION: cough  . Amiodarone     REACTION: peripheral neuropathy  . Mometasone Furoate Other (See Comments)    (Nasonex) Causes nasal bleeding and nose bleeds  . Niacin     REACTION: irregular heartbeat    Family History  Problem Relation Age of Onset  . Diabetes Other     family hx  . Sleep apnea Other     family hx  . Diabetes Mother   . Heart disease Mother   . Heart disease Father   . Heart attack Father   . Stroke Father   . Aneurysm Father     bilat pop art aneurysms    History   Social History  . Marital  Status: Married    Spouse Name: N/A    Number of Children: N/A  . Years of Education: N/A   Occupational History  . Not on file.   Social History Main Topics  . Smoking status: Never Smoker   . Smokeless tobacco: Never Used  . Alcohol Use: No  . Drug Use: No  . Sexually Active: Not on file   Other Topics Concern  . 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      Constitutional: Denies fever, malaise, fatigue, headache or abrupt weight changes.  Skin: Pt reports rash and slow healing wound on left leg. Denies redness, rashes, lesions or ulcercations.    No other specific complaints in a complete review of systems (except as listed in HPI above).     Objective:   Physical Exam  BP 132/72  Pulse 70  Temp(Src) 98.1 F (36.7 C) (Oral)  Wt 214 lb (97.07 kg)  BMI 26.75 kg/m2  SpO2 96% Wt Readings from Last 3 Encounters:  03/31/13 214 lb (97.07 kg)  10/07/12 210 lb (95.255 kg)  09/12/12 210 lb (95.255 kg)    General: Appears their stated age, well developed, well nourished in NAD. Skin: Warm, dry and intact. Slow healing wound noted on left anterior tibia, surrounded by erythema and petechial rash. Cardiovascular: Normal rate and rhythm. S1,S2 noted.  No murmur, rubs or gallops noted. No JVD or BLE edema. No carotid bruits noted. Pedal pulses 1+ bilaterally. Pulmonary/Chest: Normal effort and positive vesicular breath sounds. No respiratory distress. No wheezes, rales or ronchi noted.    BMET    Component Value Date/Time   NA 141 06/21/2012 1041   K 4.6 06/21/2012 1041   CL 106 06/21/2012 1041   CO2 28 06/21/2012 1041   GLUCOSE 91 06/21/2012 1041   BUN 23 06/21/2012 1041   CREATININE 1.2 06/21/2012 1041   CALCIUM 9.6 06/21/2012 1041   GFRNONAA 63.49 05/31/2010 1016   GFRAA 77 07/02/2008 1121    Lipid Panel     Component Value Date/Time  CHOL 119 06/21/2012 1041   TRIG 55.0 06/21/2012 1041   HDL 41.50  06/21/2012 1041   CHOLHDL 3 06/21/2012 1041   VLDL 11.0 06/21/2012 1041   LDLCALC 67 06/21/2012 1041    CBC    Component Value Date/Time   WBC 4.2* 06/21/2012 1041   RBC 4.12* 06/21/2012 1041   HGB 13.3 06/21/2012 1041   HCT 39.7 06/21/2012 1041   PLT 183.0 06/21/2012 1041   MCV 96.2 06/21/2012 1041   MCHC 33.6 06/21/2012 1041   RDW 14.2 06/21/2012 1041   LYMPHSABS 1.2 06/21/2012 1041   MONOABS 0.5 06/21/2012 1041   EOSABS 0.3 06/21/2012 1041   BASOSABS 0.0 06/21/2012 1041    Hgb A1C No results found for this basename: HGBA1C         Assessment & Plan:   Cellulitis noted of left leg with slow healing wound complicated by PVD, new onset:  eRx for keflex x 5 days 80 mg Depo IM today Petechial rash will take longer to resolve, as related to PVD  RTC as needed or if symptoms persist or worsen

## 2013-03-31 NOTE — Patient Instructions (Signed)
Cellulitis Cellulitis is an infection of the skin and the tissue beneath it. The infected area is usually red and tender. Cellulitis occurs most often in the arms and lower legs.  CAUSES  Cellulitis is caused by bacteria that enter the skin through cracks or cuts in the skin. The most common types of bacteria that cause cellulitis are Staphylococcus and Streptococcus. SYMPTOMS   Redness and warmth.  Swelling.  Tenderness or pain.  Fever. DIAGNOSIS  Your caregiver can usually determine what is wrong based on a physical exam. Blood tests may also be done. TREATMENT  Treatment usually involves taking an antibiotic medicine. HOME CARE INSTRUCTIONS   Take your antibiotics as directed. Finish them even if you start to feel better.  Keep the infected arm or leg elevated to reduce swelling.  Apply a warm cloth to the affected area up to 4 times per day to relieve pain.  Only take over-the-counter or prescription medicines for pain, discomfort, or fever as directed by your caregiver.  Keep all follow-up appointments as directed by your caregiver. SEEK MEDICAL CARE IF:   You notice red streaks coming from the infected area.  Your red area gets larger or turns dark in color.  Your bone or joint underneath the infected area becomes painful after the skin has healed.  Your infection returns in the same area or another area.  You notice a swollen bump in the infected area.  You develop new symptoms. SEEK IMMEDIATE MEDICAL CARE IF:   You have a fever.  You feel very sleepy.  You develop vomiting or diarrhea.  You have a general ill feeling (malaise) with muscle aches and pains. MAKE SURE YOU:   Understand these instructions.  Will watch your condition.  Will get help right away if you are not doing well or get worse. Document Released: 06/07/2005 Document Revised: 02/27/2012 Document Reviewed: 11/13/2011 [employer] Patient Information 2014 [employer].  

## 2013-04-03 ENCOUNTER — Encounter: Payer: Self-pay | Admitting: Internal Medicine

## 2013-04-03 DIAGNOSIS — L03116 Cellulitis of left lower limb: Secondary | ICD-10-CM

## 2013-04-04 ENCOUNTER — Encounter: Payer: Self-pay | Admitting: Internal Medicine

## 2013-04-04 MED ORDER — CEPHALEXIN 500 MG PO CAPS: 500.0000 mg | ORAL_CAPSULE | Freq: Three times a day (TID) | ORAL | Status: DC

## 2013-04-08 ENCOUNTER — Encounter: Payer: Self-pay | Admitting: Internal Medicine

## 2013-04-10 ENCOUNTER — Telehealth: Payer: Self-pay

## 2013-04-10 ENCOUNTER — Encounter: Payer: Self-pay | Admitting: Internal Medicine

## 2013-04-10 DIAGNOSIS — I739 Peripheral vascular disease, unspecified: Secondary | ICD-10-CM

## 2013-04-10 DIAGNOSIS — L03116 Cellulitis of left lower limb: Secondary | ICD-10-CM

## 2013-04-10 MED ORDER — CEPHALEXIN 500 MG PO CAPS: 500.0000 mg | ORAL_CAPSULE | Freq: Three times a day (TID) | ORAL | Status: DC

## 2013-04-10 NOTE — Telephone Encounter (Signed)
Refill done.  

## 2013-04-10 NOTE — Telephone Encounter (Signed)
Pt. called to report left ankle ulcer that hasn't healed after approx. 10 days of Keflex.  States the open area has decreased in size by approx. 75 %, but continues to have "red rash" surrounding open sore.  States his PCP's nurse practitioner has been treating him, and advised to be evaluated by vascular surgeon, due to incomplete healing.  Discussed w/ Dr. Darrick Penna, in office today.  Advised to repeat left lower extrem. Arterial duplex and ABI's, and schedule office visit.  Notified pt. of appts. to repeat vascular studies tomorrow, and see Dr. Imogene Burn following; advised to arrive at 2:45 PM on 8/1.  Pt. questioned about ordering additional antibiotics today.  Advised to call his PCP, since [facility] hasn't yet evaluated the ulcer.  Verb. Understanding.

## 2013-04-11 ENCOUNTER — Encounter (INDEPENDENT_AMBULATORY_CARE_PROVIDER_SITE_OTHER): Payer: Medicare Other | Admitting: *Deleted

## 2013-04-11 ENCOUNTER — Encounter: Payer: Self-pay | Admitting: Vascular Surgery

## 2013-04-11 ENCOUNTER — Ambulatory Visit (INDEPENDENT_AMBULATORY_CARE_PROVIDER_SITE_OTHER): Payer: Medicare Other | Admitting: Vascular Surgery

## 2013-04-11 VITALS — BP 130/77 | HR 64 | Temp 98.2°F | Resp 16 | Ht 75.0 in | Wt 211.0 lb

## 2013-04-11 DIAGNOSIS — L819 Disorder of pigmentation, unspecified: Secondary | ICD-10-CM

## 2013-04-11 DIAGNOSIS — I739 Peripheral vascular disease, unspecified: Secondary | ICD-10-CM

## 2013-04-11 DIAGNOSIS — I724 Aneurysm of artery of lower extremity: Secondary | ICD-10-CM

## 2013-04-11 NOTE — Progress Notes (Signed)
[facility] [place] [facility]  Established Pop Aneurysms  History of Present Illness  [name] is a 72 y.o. ([birth year]) male s/p B pop aneurysm repair with fem-pop (R:10/18/01 & L:02/19/02) who presents with chief complaint: poorly healing L shin wound.  ~9 months ago the pt banged his left shin accidentally and nicked the L shin.  Over the next 9 months, the pt notes poor healing requiring two round of abx treatment.  The pt notes some swelling during its development without any frank pus drainage.  The patient denies any fever/chills, intermittent claudication, or rest pain.  The wound on his shin has long since seal with continued discoloration.  The patient's treatment regimen currently included: maximal medical management and antibiotics.  The patient's PMH, PSH, SH, FamHx, Med, and Allergies are unchanged from 01/30/12.  On ROS today: LLE cellulitis treated with abx, no h/o L leg fx  Physical Examination  Filed Vitals:   04/11/13 1655  BP: 130/77  Pulse: 64  Temp: 98.2 F (36.8 C)  TempSrc: Oral  Resp: 16  Height: 6\' 3"  (1.905 m)  Weight: 211 lb (95.709 kg)  SpO2: 100%   Body mass index is 26.37 kg/(m^2).  General: A&O x 3, WD, thin  Pulmonary: Sym exp, good air movt, CTAB, no rales, rhonchi, & wheezing  Cardiac: RRR, Nl S1, S2, no Murmurs, rubs or gallops  Vascular: Vessel Right Left  Radial Palpable Palpable  Brachial Palpable Palpable  Carotid Palpable, without bruit Palpable, without bruit  Aorta Not palpable N/A  Femoral Palpable Palpable  Popliteal Not palpable Not palpable  PT Palpable Palpable  DP Palpable Palpable   Gastrointestinal: soft, NTND, -G/R, - HSM, - masses, - CVAT B  Musculoskeletal: M/S 5/5 throughout , Extremities without ischemic changes , circular area of hyperpigmentation with some hyperkeratosis on anterior shin (prior injury site), no streaking, no obvious blanching  Neurologic: Pain and light touch intact in extremities  , Motor exam as listed above  Non-Invasive Vascular Imaging ABI (Date: 04/11/2013)  R: 1.30 (1.21), DP: tri, PT: tri, TBI: 0.92  L: 1.27 (1.33), DP: tri, PT: tri, TBI: 1.04  Medical Decision Making  [name] is a 72 y.o. male who presents with:  H/o traumatic injury to L shin, no evidence of PAD s/p bilateral fem-pop BPG for pop aneurysms  Pt has normal blood flow to the Left leg, so I doubt there is an ischemic etiology for the poor healing at the shin site.  The history should somewhat suggestive of an infection anterior shin hematoma, but the exam isn't confirmatory.  Given the long-term duration of this lesion, I think it is reasonable to have Dermatology complete a shave biopsy of the shin lesion to rule out a neoplasm.  Based on the patient's vascular studies and examination, I have offered the patient: continued annual surveillance for BLE graft duplex.  I discussed in depth with the patient the nature of atherosclerosis, and emphasized the importance of maximal medical management including strict control of blood pressure, blood glucose, and lipid levels, antiplatelet agents, obtaining regular exercise, and cessation of smoking.    The patient is aware that without maximal medical management the underlying atherosclerotic disease process will progress, limiting the benefit of any interventions. The patient is currently on a statin: Lipitor. The patient is currently on an anti-platelet: ASA.    Thank you for allowing [place] to participate in this patient's care.  Leonides Sake, MD [facility] Office:  [phone] Pager: [phone]  04/11/2013, 5:31 PM

## 2013-04-15 ENCOUNTER — Encounter: Payer: Self-pay | Admitting: Internal Medicine

## 2013-04-15 DIAGNOSIS — L03116 Cellulitis of left lower limb: Secondary | ICD-10-CM

## 2013-04-16 ENCOUNTER — Encounter: Payer: Self-pay | Admitting: Internal Medicine

## 2013-04-16 MED ORDER — CEPHALEXIN 500 MG PO CAPS: 500.0000 mg | ORAL_CAPSULE | Freq: Three times a day (TID) | ORAL | Status: DC

## 2013-08-31 ENCOUNTER — Other Ambulatory Visit: Payer: Self-pay | Admitting: Internal Medicine

## 2013-09-01 MED ORDER — ATORVASTATIN CALCIUM 80 MG PO TABS: 40.0000 mg | ORAL_TABLET | Freq: Every day | ORAL | Status: DC

## 2013-09-20 ENCOUNTER — Encounter: Payer: Self-pay | Admitting: Internal Medicine

## 2013-10-04 ENCOUNTER — Encounter: Payer: Self-pay | Admitting: Internal Medicine

## 2013-10-06 NOTE — Telephone Encounter (Signed)
[name] to offer pt appt with Dr Tamala Julian please for lower back pain

## 2013-10-07 ENCOUNTER — Encounter: Payer: Self-pay | Admitting: Internal Medicine

## 2013-10-07 ENCOUNTER — Ambulatory Visit (INDEPENDENT_AMBULATORY_CARE_PROVIDER_SITE_OTHER): Payer: Medicare Other | Admitting: Internal Medicine

## 2013-10-07 VITALS — BP 131/74 | HR 60 | Ht 75.0 in | Wt 212.5 lb

## 2013-10-07 DIAGNOSIS — I4891 Unspecified atrial fibrillation: Secondary | ICD-10-CM

## 2013-10-07 DIAGNOSIS — I495 Sick sinus syndrome: Secondary | ICD-10-CM

## 2013-10-07 DIAGNOSIS — Z950 Presence of cardiac pacemaker: Secondary | ICD-10-CM

## 2013-10-07 DIAGNOSIS — E785 Hyperlipidemia, unspecified: Secondary | ICD-10-CM

## 2013-10-07 LAB — MDC_IDC_ENUM_SESS_TYPE_INCLINIC
Brady Statistic RV Percent Paced: 6 %
Lead Channel Impedance Value: 331 Ohm
Lead Channel Pacing Threshold Amplitude: 1 V
Lead Channel Pacing Threshold Pulse Width: 0.4 ms
Lead Channel Setting Pacing Amplitude: 1.6 V
Lead Channel Setting Pacing Amplitude: 2.4 V
Lead Channel Setting Pacing Pulse Width: 0.4 ms
MDC IDC MSMT LEADCHNL RA PACING THRESHOLD AMPLITUDE: 0.6 V
MDC IDC MSMT LEADCHNL RA PACING THRESHOLD PULSEWIDTH: 0.4 ms
MDC IDC MSMT LEADCHNL RA SENSING INTR AMPL: 2 mV
MDC IDC MSMT LEADCHNL RV IMPEDANCE VALUE: 312 Ohm
MDC IDC MSMT LEADCHNL RV SENSING INTR AMPL: 4.1 mV
MDC IDC PG SERIAL: 66339555
MDC IDC SESS DTM: 20150127161712
MDC IDC STAT BRADY RA PERCENT PACED: 70 %

## 2013-10-07 NOTE — Progress Notes (Signed)
Patient Care Team: Biagio Borg, MD as PCP - General Deboraha Sprang, MD (Cardiology)   HPI  [name] is a 73 y.o. 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 .\  His pacemaker reached ERI 8/13 and he underwent generator replacement with a Biotronik device because of his CLS function  He takes dofetilide for his atrial fibrillation. He is doing better following reprogramming of his CLS function.   Past Medical History  Diagnosis Date  . Hyperlipidemia   . Coronary artery disease     LAD stenting 2004 non-DES  . Atrial fibrillation     previous therapy includes amiodarone and sotalol  . Peripheral vascular disease   . Allergic rhinitis   . Depression   . OSA (obstructive sleep apnea)     CPAP  . Arthritis   . Bilateral popliteal artery aneurysm   . Colon cancer high risk     colonoscopy 2006 by Dr. Lyla Son, next colonoscopy at 10 year interval  . Pacemaker   . Allergic rhinitis, cause unspecified 06/22/2012    Past Surgical History  Procedure Laterality Date  . Inguinal hernia repair    . Stent (unspec)      x2  . Pacemaker insertion      due to bradycardia  . Right and left fem-pop bypass    . Hand surgery    . Left knee surgery      Current Outpatient Prescriptions  Medication Sig Dispense Refill  . aspirin (BAYER ASPIRIN) 325 MG tablet Take 88 mg by mouth daily. Takes 1/4 tablet      . atorvastatin (LIPITOR) 80 MG tablet Take 0.5 tablets (40 mg total) by mouth daily.  45 tablet  1  . CALCIUM PO Take 600 mg by mouth 2 (two) times daily.      . cephALEXin (KEFLEX) 500 MG capsule Take 1 capsule (500 mg total) by mouth 3 (three) times daily.  15 capsule  0  . Cyanocobalamin (VITAMIN B 12 PO) Take 1 tablet by mouth daily.      Marland Kitchen diltiazem (DILACOR XR) 120 MG 24 hr capsule Take 1 capsule (120 mg total) by mouth daily.  90  capsule  3  . dofetilide (TIKOSYN) 500 MCG capsule Take 1 capsule (500 mcg total) by mouth 2 (two) times daily.  180 capsule  3  . doxycycline (VIBRA-TABS) 100 MG tablet Take 1 tablet (100 mg total) by mouth 2 (two) times daily.  20 tablet  0  . fenofibrate (TRICOR) 145 MG tablet Take 1 tablet (145 mg total) by mouth daily.  90 tablet  3  . fexofenadine (ALLEGRA) 180 MG tablet Take 180 mg by mouth daily.      Marland Kitchen FOLIC ACID PO Take 1 tablet by mouth daily.      Marland Kitchen GLUCOSAMINE CHONDROITIN COMPLX PO Take 1 tablet by mouth daily.      Marland Kitchen ibuprofen (ADVIL,MOTRIN) 200 MG tablet Take 400 mg by mouth every 6 (six) hours as needed. For pain      . levofloxacin (LEVAQUIN) 250 MG tablet Take 1 tablet (250 mg total) by mouth daily.  14 tablet  0  . Lysine 500 MG CAPS Take 1 capsule by mouth 3 (three) times daily.      . Magnesium 250 MG TABS Take 1 tablet by mouth 2 (two) times daily.      Marland Kitchen  Omega-3 Fatty Acids (FISH OIL) 1200 MG CAPS Take 1 capsule by mouth daily.       . potassium chloride (KLOR-CON M10) 10 MEQ tablet Take 1 tablet (10 mEq total) by mouth 2 (two) times daily.  180 tablet  3  . tadalafil (CIALIS) 20 MG tablet Take 1 tablet (20 mg total) by mouth daily as needed. For ED  30 tablet  3  . triamcinolone (NASACORT) 55 MCG/ACT nasal inhaler Place 1 spray into the nose 2 (two) times daily.  3 Inhaler  3  . zolpidem (AMBIEN) 10 MG tablet Take 2.5-5 mg by mouth at bedtime as needed. For sleep       No current facility-administered medications for this visit.    Allergies  Allergen Reactions  . Ace Inhibitors     REACTION: cough  . Amiodarone     REACTION: peripheral neuropathy  . Mometasone Furoate Other (See Comments)    (Nasonex) Causes nasal bleeding and nose bleeds  . Niacin     REACTION: irregular heartbeat    Review of Systems negative except from HPI and PMH  Physical Exam BP 131/74  Pulse 60  Ht 6\' 3"  (1.905 m)  Wt 212 lb 8 oz (96.389 kg)  BMI 26.56 kg/m2 Well developed and  well nourished in no acute distress HENT normal E scleral and icterus clear Neck Supple JVP flat; carotids brisk and full Clear to ausculation  *Regular rate and rhythm, no murmurs gallops or rub Soft with active bowel sounds No clubbing cyanosis non-pitting and none Edema Alert and oriented, grossly normal motor and sensory function Skin Warm and Dry  ECG demonstrates atrial paced rhythm at 91 Intervals 16/09/36  Assessment and  Plan

## 2013-10-07 NOTE — Patient Instructions (Addendum)
Your physician recommends that you continue on your current medications as directed. Please refer to the Current Medication list given to you today.   Please call Sherri with a decision on Eliquis    -- [phone]  Your physician recommends that you return for lab work today: BMET, Magnesium   Your physician wants you to follow-up in: 6 months with Dr. Caryl Comes. You will receive a reminder letter in the mail two months in advance. If you don't receive a letter, please call our office to schedule the follow-up appointment.

## 2013-10-07 NOTE — Assessment & Plan Note (Addendum)
Paroxysmal atrial fibrillation.  On dofetilide. QTC is 435  CHADS-VASc score is 2-3 with age of 89 blood pressure 131 on diltiazem with vascular disease and 8/65. We discussed the use of the NOACs compared to Coumadin. We briefly reviewed the data of at least comparability in stroke prevention, bleeding and outcome. We discussed some of the new once wherein somewhat associated with decreased ischemic stroke risk, one to be taken daily, and has been shown to be comparable and bleeding risk to aspirin.  We also discussed bleeding associated with warfarin as well as NOACs and a wall bleeding as a complication of all these drugs intracranial bleeding is more frequently associated with warfarin then the NOACs and a GI bleeding is more commonly associated with the latter  >50% 40 mion

## 2013-10-07 NOTE — Assessment & Plan Note (Signed)
The patient's device was interrogated.  The information was reviewed. No changes were made in the programming.    

## 2013-10-07 NOTE — Assessment & Plan Note (Signed)
Medications renewed.

## 2013-10-08 LAB — BASIC METABOLIC PANEL
BUN: 22 mg/dL (ref 6–23)
CALCIUM: 9.8 mg/dL (ref 8.4–10.5)
CHLORIDE: 106 meq/L (ref 96–112)
CO2: 29 meq/L (ref 19–32)
Creatinine, Ser: 1.2 mg/dL (ref 0.4–1.5)
GFR: 62.89 mL/min (ref >60.00)
Glucose, Bld: 77 mg/dL (ref 70–99)
POTASSIUM: 4.2 meq/L (ref 3.5–5.1)
SODIUM: 140 meq/L (ref 135–145)

## 2013-10-08 LAB — MAGNESIUM: Magnesium: 1.9 mg/dL (ref 1.5–2.5)

## 2013-10-09 ENCOUNTER — Other Ambulatory Visit: Payer: Self-pay | Admitting: *Deleted

## 2013-10-09 ENCOUNTER — Encounter: Payer: Self-pay | Admitting: Internal Medicine

## 2013-10-09 MED ORDER — APIXABAN 5 MG PO TABS: 5.0000 mg | ORAL_TABLET | Freq: Two times a day (BID) | ORAL | Status: DC

## 2013-10-13 ENCOUNTER — Other Ambulatory Visit (INDEPENDENT_AMBULATORY_CARE_PROVIDER_SITE_OTHER): Payer: Medicare Other

## 2013-10-13 ENCOUNTER — Ambulatory Visit (INDEPENDENT_AMBULATORY_CARE_PROVIDER_SITE_OTHER)
Admission: RE | Admit: 2013-10-13 | Discharge: 2013-10-13 | Disposition: A | Discharge status: Home / Self Care | Payer: Medicare Other | Source: Ambulatory Visit | Attending: Family Medicine | Admitting: Family Medicine

## 2013-10-13 ENCOUNTER — Ambulatory Visit (INDEPENDENT_AMBULATORY_CARE_PROVIDER_SITE_OTHER): Payer: Medicare Other | Admitting: Family Medicine

## 2013-10-13 ENCOUNTER — Telehealth: Payer: Self-pay | Admitting: *Deleted

## 2013-10-13 ENCOUNTER — Encounter: Payer: Self-pay | Admitting: Family Medicine

## 2013-10-13 VITALS — BP 130/62 | HR 97 | Temp 98.1°F | Resp 16 | Wt 211.1 lb

## 2013-10-13 DIAGNOSIS — G8929 Other chronic pain: Secondary | ICD-10-CM

## 2013-10-13 DIAGNOSIS — M25559 Pain in unspecified hip: Secondary | ICD-10-CM

## 2013-10-13 DIAGNOSIS — M25552 Pain in left hip: Secondary | ICD-10-CM

## 2013-10-13 DIAGNOSIS — M549 Dorsalgia, unspecified: Secondary | ICD-10-CM

## 2013-10-13 DIAGNOSIS — M533 Sacrococcygeal disorders, not elsewhere classified: Secondary | ICD-10-CM

## 2013-10-13 NOTE — Patient Instructions (Signed)
Very nice to meet you Injection today Ice 20 minutes 2 times a day can help Sacroiliac Joint Mobilization and Rehab 1. Work on pretzel stretching, shoulder back and leg draped in front. 3-5 sets, 30 sec.. 2. hip abductor rotations. standing, hip flexion and rotation outward then inward. 3 sets, 15 reps. when can do comfortably, add ankle weights starting at 2 pounds.  3. cross over stretching - shoulder back to ground, same side leg crossover. 3-5 sets for 30 min..  4. rolling up and back knees to chest and rocking. 5. sacral tilt - 5 sets, hold for 5-10 seconds Look at handouts Take tylenol 650 mg three times a day is the best evidence based medicine we have for arthritis.  Glucosamine sulfate 750mg  twice a day is a supplement that has been shown to help moderate to severe arthritis. Vitamin D 2000 IU daily Tumeric 500mg  twice daily.  Capsaicin topically up to four times a day may also help with pain. Come back and see me in 3 weeks.

## 2013-10-13 NOTE — Telephone Encounter (Signed)
PA completed for the eliquis and faxed to [facility]

## 2013-10-13 NOTE — Progress Notes (Signed)
I'm seeing this patient by the request  of:  Cathlean Cower, MD   CC: Back pain  HPI: Patient is a relatively healthy 73 year old gentleman coming in with back pain. Patient states that he thinks it seems to be more of his left sacroiliac joint. He's had this pain for several months. Patient states it is causing him to limp. Patient has been seen by different chiropractors with limited benefit. Patient states that he has this chronic pain no unfortunately in the left lower back seems to be constant and worse with ambulation. Patient denies any radiation of the leg but states that he does have weakness in his legs but this is bilateral. Patient continues try to do some exercises at home without any significant improvement, discussed different medications over-the-counter but he is concerned secondary to his heart. Patient is a severity of 8/10 with walking and 3 at 10 at rest.   Past medical, surgical, family and social history reviewed. Medications reviewed all in the electronic medical record.   Review of Systems: No headache, visual changes, nausea, vomiting, diarrhea, constipation, dizziness, abdominal pain, skin rash, fevers, chills, night sweats, weight loss, swollen lymph nodes, body aches, joint swelling, muscle aches, chest pain, shortness of breath, mood changes.   Objective:    Blood pressure 130/62, pulse 97, temperature 98.1 F (36.7 C), temperature source Oral, resp. rate 16, weight 211 lb 1.9 oz (95.763 kg), SpO2 95.00%.   General: No apparent distress alert and oriented x3 mood and affect normal, dressed appropriately.  HEENT: Pupils equal, extraocular movements intact Respiratory: Patient's speak in full sentences and does not appear short of breath Cardiovascular: No lower extremity edema, non tender, no erythema Skin: Warm dry intact with no signs of infection or rash on extremities or on axial skeleton. Abdomen: Soft nontender Neuro: Cranial nerves II through XII are intact,  neurovascularly intact in all extremities with 2+ DTRs and 2+ pulses. Lymph: No lymphadenopathy of posterior or anterior cervical chain or axillae bilaterally.  Gait patient does take short strides in does have poor balance.  MSK: Non tender with full range of motion and good stability and symmetric strength and tone of shoulders, elbows, wrist,  knee and ankles bilaterally.  Back Exam:  Inspection: Mild increase in kyphosis and mild decrease in lordosis of the lumbar spine Motion: Flexion 25 deg, Extension 15 deg, Side Bending to 25 deg bilaterally,  Rotation to 35 deg bilaterally  SLR laying: Negative  XSLR laying: Negative  Palpable tenderness: Significant pain over the left SI joint FABER: Unable to do secondary to tightness Sensory change: Gross sensation intact to all lumbar and sacral dermatomes.  Reflexes: 2+ at both patellar tendons, 2+ at achilles tendons, Babinski's downgoing.  Strength at foot  Plantar-flexion: 5/5 Dorsi-flexion: 5/5 Eversion: 5/5 Inversion: 5/5  Leg strength  Quad: 4/5 Hamstring: 4/5 Hip flexor: 4/5 Hip abductors: 3/5  Hip: Right ROM IR: 15 Deg, ER: 25 Deg, Flexion: 100 Deg, Extension: 60 Deg, Abduction: 35 Deg, Adduction: 25 Deg Strength IR: 5/5, ER: 5/5, Flexion: 5/5, Extension: 5/5, Abduction: 3/5, Adduction: 3/5 Pelvic alignment unremarkable to inspection and palpation. Standing hip rotation and gait without trendelenburg sign / unsteadiness. Greater trochanter without tenderness to palpation. No tenderness over piriformis and greater trochanter. pain with FABER or FADIR.   Limited musculoskeletal ultrasound was performed and interpreted by me today. 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  Procedure: Real-time Ultrasound Guided Injection of left SI joint Device: GE Logiq  E  Ultrasound guided injection is preferred based studies that show increased duration, increased effect, greater accuracy, decreased procedural  pain, increased response rate, and decreased cost with ultrasound guided versus blind injection.  Verbal informed consent obtained.  Time-out conducted.  Noted no overlying erythema, induration, or other signs of local infection.  Skin prepped in a sterile fashion.  Local anesthesia: Topical Ethyl chloride.  With sterile technique and under real time ultrasound guidance: 27-gauge 1-1/2 inch needle was injected into the skin and 2 cc of 0.5% Marcaine and 1 cc of Kenalog 40 mg/dL was injected into the left SI joint under ultrasound guidance.   Completed without difficulty  Pain immediately resolved suggesting accurate placement of the medication.  Advised to call if fevers/chills, erythema, induration, drainage, or persistent bleeding.  Images permanently stored and available for review in the ultrasound unit.  Impression: Technically successful ultrasound guided injection.   Impression and Recommendations:     This case required medical decision making of moderate complexity.

## 2013-10-13 NOTE — Assessment & Plan Note (Addendum)
Patient did have injection today into the left sacroiliac joint. I'm guessing patient does have fairly severe arthritis as well as likely unfortunately arthritis in his hip. We'll get x-rays for a baseline and to discuss further. Patient given home exercise program and discussed over-the-counter medications can be beneficial. Patient will try these interventions and come back again in 2-3 weeks. We can consider manipulation therapy with him but because of the severity of this did not know how he will respond. Differential includes lumbar radiculopathy as well as questionable osteoarthritic changes of the left hip

## 2013-10-13 NOTE — Progress Notes (Signed)
Pre-visit discussion using our clinic review tool. No additional management support is needed unless otherwise documented below in the visit note.  

## 2013-10-14 NOTE — Telephone Encounter (Signed)
[facility] approved eliquis through 10/13/2014

## 2013-10-22 ENCOUNTER — Telehealth: Payer: Self-pay | Admitting: Family Medicine

## 2013-10-22 ENCOUNTER — Encounter: Payer: Self-pay | Admitting: Family Medicine

## 2013-10-22 MED ORDER — LIDOCAINE 5 % EX PTCH: 1.0000 | MEDICATED_PATCH | Freq: Two times a day (BID) | CUTANEOUS | Status: DC

## 2013-10-22 NOTE — Telephone Encounter (Signed)
Sent in rx as patient requestes.

## 2013-10-24 ENCOUNTER — Encounter: Payer: Self-pay | Admitting: Family Medicine

## 2013-11-03 ENCOUNTER — Ambulatory Visit: Payer: Medicare Other | Admitting: Family Medicine

## 2013-11-05 ENCOUNTER — Encounter: Payer: Self-pay | Admitting: Family Medicine

## 2013-11-05 ENCOUNTER — Ambulatory Visit (INDEPENDENT_AMBULATORY_CARE_PROVIDER_SITE_OTHER): Payer: Medicare Other | Admitting: Family Medicine

## 2013-11-05 ENCOUNTER — Other Ambulatory Visit (INDEPENDENT_AMBULATORY_CARE_PROVIDER_SITE_OTHER): Payer: Medicare Other

## 2013-11-05 ENCOUNTER — Telehealth: Payer: Self-pay | Admitting: *Deleted

## 2013-11-05 VITALS — BP 128/74 | HR 76 | Temp 97.2°F | Resp 16 | Wt 205.1 lb

## 2013-11-05 DIAGNOSIS — M1612 Unilateral primary osteoarthritis, left hip: Secondary | ICD-10-CM

## 2013-11-05 DIAGNOSIS — M25559 Pain in unspecified hip: Secondary | ICD-10-CM

## 2013-11-05 DIAGNOSIS — M1611 Unilateral primary osteoarthritis, right hip: Secondary | ICD-10-CM | POA: Insufficient documentation

## 2013-11-05 DIAGNOSIS — M25552 Pain in left hip: Secondary | ICD-10-CM

## 2013-11-05 DIAGNOSIS — M1610 Unilateral primary osteoarthritis, unspecified hip: Secondary | ICD-10-CM

## 2013-11-05 DIAGNOSIS — M169 Osteoarthritis of hip, unspecified: Secondary | ICD-10-CM

## 2013-11-05 NOTE — Patient Instructions (Signed)
Good to see you Tylenol 650 mg three times daily.  See how this injection feels.  Give me an email start of next week and tell me how you are doing.  I fyou are doing great I can do this injection every three months.  Try the topical twice daily to see if it helps We will check on lidocaine patches again.  Dr. Rhona Raider at [facility] is who I would reccommended.

## 2013-11-05 NOTE — Telephone Encounter (Signed)
Spoke with Verdis Frederickson at [facility] [phone].  PA for Lidocaine 5% patch resubmitted.  Reference #  [id number]

## 2013-11-05 NOTE — Progress Notes (Signed)
Pre visit review using our clinic review tool, if applicable. No additional management support is needed unless otherwise documented below in the visit note. 

## 2013-11-05 NOTE — Assessment & Plan Note (Signed)
Patient responded very well to the intra-articular injection today with near complete resolution of pain. This also resolved most of his sacroiliac pain that he was having. This is diagnostic for intra-articular hip pathology. We discussed at great length about different treatment options. I do feel that patient's been very active, in relatively good health that he should discuss possible hip replacement with a surgeon. He will be referred to [facility] orthopedic for further evaluation and discussion.  The patient does get great relief with the injections we can continue this every 3 months and we'll do this if patient needs to continue working in the interim. Otherwise we did come to the agreement that replacement would likely be his best option.

## 2013-11-05 NOTE — Progress Notes (Signed)
I'm seeing this patient by the request  of:  Cathlean Cower, MD   CC: Back pain, hip pain.   HPI: Patient is a relatively healthy 73 year old gentleman coming in with back pain. Patient was diagnosed with a sacroiliac joint dysfunction and was given an injection. Patient states he is still 40% better than he was previously. Patient has been doing home exercises which has been helpful but continues to have pain in the groin as well as the back. I was concerned that there would be potential hip pathology so x-rays were ordered. 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 Patient also has severe arthritis of the right hip. Patient states though that he wants to be active and he is continuing to work at this time. Denies any new symptoms such as radiation down the legs or any bowel or bladder incontinence.   Past medical, surgical, family and social history reviewed. Medications reviewed all in the electronic medical record.   Review of Systems: No headache, visual changes, nausea, vomiting, diarrhea, constipation, dizziness, abdominal pain, skin rash, fevers, chills, night sweats, weight loss, swollen lymph nodes, body aches, joint swelling, muscle aches, chest pain, shortness of breath, mood changes.   Objective:    Blood pressure 128/74, pulse 76, temperature 97.2 F (36.2 C), temperature source Oral, resp. rate 16, weight 205 lb 1.3 oz (93.024 kg), SpO2 98.00%.   General: No apparent distress alert and oriented x3 mood and affect normal, dressed appropriately.  HEENT: Pupils equal, extraocular movements intact Respiratory: Patient's speak in full sentences and does not appear short of breath Cardiovascular: No lower extremity edema, non tender, no erythema Skin: Warm dry intact with no signs of infection or rash on extremities or on axial skeleton. Abdomen: Soft nontender Neuro: Cranial nerves II through XII are intact, neurovascularly intact  in all extremities with 2+ DTRs and 2+ pulses. Lymph: No lymphadenopathy of posterior or anterior cervical chain or axillae bilaterally.  Gait patient does take short strides in does have poor balance.  MSK: Non tender with full range of motion and good stability and symmetric strength and tone of shoulders, elbows, wrist,  knee and ankles bilaterally.  Back Exam:  Inspection: Mild increase in kyphosis and mild decrease in lordosis of the lumbar spine Motion: Flexion 25 deg, Extension 15 deg, Side Bending to 25 deg bilaterally,  Rotation to 35 deg bilaterally  SLR laying: Negative  XSLR laying: Negative  Palpable tenderness: Significant pain over the left SI joint FABER: Unable to do secondary to tightness Sensory change: Gross sensation intact to all lumbar and sacral dermatomes.  Reflexes: 2+ at both patellar tendons, 2+ at achilles tendons, Babinski's downgoing.  Strength at foot  Plantar-flexion: 5/5 Dorsi-flexion: 5/5 Eversion: 5/5 Inversion: 5/5  Leg strength  Quad: 4/5 Hamstring: 4/5 Hip flexor: 4/5 Hip abductors: 3/5  Hip: Right ROM IR: 15 Deg, ER: 25 Deg, Flexion: 100 Deg, Extension: 60 Deg, Abduction: 35 Deg, Adduction: 25 Deg Strength IR: 5/5, ER: 5/5, Flexion: 5/5, Extension: 5/5, Abduction: 3/5, Adduction: 3/5 Pelvic alignment unremarkable to inspection and palpation. Standing hip rotation and gait without trendelenburg sign / unsteadiness. Greater trochanter without tenderness to palpation. No tenderness over piriformis and greater trochanter. pain with FABER  AND ESPECIALLY WITH FADIR.  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   MSK US performed of: Right hip This study was ordered, performed, and interpreted by Charlann Boxer  D.O.  Hip: Trochanteric bursa with mild bursitis present Acetabular labrum unable to be visualized secondary to the amount of arthritis. Femoral neck appears to  have no fracture by patient does have significant osteoarthritic changes.  IMPRESSION:  Severe osteoarthritis   Procedure: Real-time Ultrasound Guided Injection of right hip Device: GE Logiq E  Ultrasound guided injection is preferred based studies that show increased duration, increased effect, greater accuracy, decreased procedural pain, increased response rate with ultrasound guided versus blind injection.  Verbal informed consent obtained.  Time-out conducted.  Noted no overlying erythema, induration, or other signs of local infection.  Skin prepped in a sterile fashion.  Local anesthesia: Topical Ethyl chloride.  With sterile technique and under real time ultrasound guidance:  Anterior capsule visualized, needle visualized going to the head neck junction at the anterior capsule. Pictures taken. Patient did have injection of 3 cc of 1% lidocaine, 3 cc of 0.5% Marcaine, and 1 cc of Kenalog 40 mg/dL. Completed without difficulty  Pain immediately resolved suggesting accurate placement of the medication.  Advised to call if fevers/chills, erythema, induration, drainage, or persistent bleeding.  Images permanently stored and available for review in the ultrasound unit.  Impression: Technically successful ultrasound guided injection. Patient had near complete resolution of pain.  Impression and Recommendations:     This case required medical decision making of moderate complexity. Spent greater than 25 minutes with patient face-to-face and had greater than 50% of counseling including as described above in assessment and plan.

## 2013-11-10 ENCOUNTER — Telehealth: Payer: Self-pay | Admitting: Internal Medicine

## 2013-11-10 ENCOUNTER — Encounter: Payer: Self-pay | Admitting: Internal Medicine

## 2013-11-10 NOTE — Telephone Encounter (Signed)
Recd records from [facility] Forwarding 1of 3 to Dr.John, Jeneen Rinks

## 2013-11-11 ENCOUNTER — Other Ambulatory Visit: Payer: Self-pay

## 2013-11-11 DIAGNOSIS — I495 Sick sinus syndrome: Secondary | ICD-10-CM

## 2013-11-11 MED ORDER — POTASSIUM CHLORIDE CRYS ER 10 MEQ PO TBCR: 10.0000 meq | EXTENDED_RELEASE_TABLET | Freq: Two times a day (BID) | ORAL | Status: DC

## 2013-11-18 ENCOUNTER — Encounter: Payer: Self-pay | Admitting: *Deleted

## 2013-11-25 ENCOUNTER — Encounter (HOSPITAL_COMMUNITY): Payer: Self-pay | Admitting: Pharmacy Technician

## 2013-12-01 ENCOUNTER — Ambulatory Visit (HOSPITAL_COMMUNITY)
Admission: RE | Admit: 2013-12-01 | Discharge: 2013-12-01 | Disposition: A | Discharge status: Home / Self Care | Payer: Medicare Other | Source: Ambulatory Visit | Attending: Anesthesiology | Admitting: Anesthesiology

## 2013-12-01 ENCOUNTER — Encounter (HOSPITAL_COMMUNITY): Payer: Self-pay

## 2013-12-01 ENCOUNTER — Encounter (HOSPITAL_COMMUNITY)
Admission: RE | Admit: 2013-12-01 | Discharge: 2013-12-01 | Disposition: A | Discharge status: Home / Self Care | Payer: Medicare Other | Source: Ambulatory Visit | Attending: Orthopaedic Surgery | Admitting: Orthopaedic Surgery

## 2013-12-01 DIAGNOSIS — Z01818 Encounter for other preprocedural examination: Secondary | ICD-10-CM | POA: Insufficient documentation

## 2013-12-01 DIAGNOSIS — Z01812 Encounter for preprocedural laboratory examination: Secondary | ICD-10-CM | POA: Insufficient documentation

## 2013-12-01 HISTORY — DX: Personal history of colonic polyps: Z86.010

## 2013-12-01 HISTORY — DX: Diverticulosis of intestine, part unspecified, without perforation or abscess without bleeding: K57.90

## 2013-12-01 HISTORY — DX: Insomnia, unspecified: G47.00

## 2013-12-01 HISTORY — DX: Effusion, unspecified joint: M25.40

## 2013-12-01 HISTORY — DX: Personal history of colon polyps, unspecified: Z86.0100

## 2013-12-01 HISTORY — DX: Pain in unspecified joint: M25.50

## 2013-12-01 HISTORY — DX: Dysphagia, unspecified: R13.10

## 2013-12-01 HISTORY — DX: Polyneuropathy, unspecified: G62.9

## 2013-12-01 HISTORY — DX: Pleurisy: R09.1

## 2013-12-01 HISTORY — DX: Urgency of urination: R39.15

## 2013-12-01 LAB — ABO/RH: ABO/RH(D): O POS

## 2013-12-01 LAB — CBC
HCT: 44 % (ref 39.0–52.0)
Hemoglobin: 15.4 g/dL (ref 13.0–17.0)
MCH: 34.1 pg — ABNORMAL HIGH (ref 26.0–34.0)
MCHC: 35 g/dL (ref 30.0–36.0)
MCV: 97.3 fL (ref 78.0–100.0)
Platelets: 185 10*3/uL (ref 150–400)
RBC: 4.52 MIL/uL (ref 4.22–5.81)
RDW: 13.9 % (ref 11.5–15.5)
WBC: 7.2 10*3/uL (ref 4.0–10.5)

## 2013-12-01 LAB — SURGICAL PCR SCREEN
MRSA, PCR: NEGATIVE
Staphylococcus aureus: POSITIVE — AB

## 2013-12-01 LAB — BASIC METABOLIC PANEL
BUN: 20 mg/dL (ref 6–23)
CALCIUM: 10.2 mg/dL (ref 8.4–10.5)
CO2: 29 mEq/L (ref 19–32)
Chloride: 103 mEq/L (ref 96–112)
Creatinine, Ser: 1.1 mg/dL (ref 0.50–1.35)
GFR calc Af Amer: 74 mL/min — ABNORMAL LOW (ref >90)
GFR, EST NON AFRICAN AMERICAN: 64 mL/min — AB (ref >90)
GLUCOSE: 87 mg/dL (ref 70–99)
Potassium: 4.7 mEq/L (ref 3.7–5.3)
Sodium: 141 mEq/L (ref 137–147)

## 2013-12-01 LAB — PROTIME-INR
INR: 1.32 (ref 0.00–1.49)
Prothrombin Time: 16.1 seconds — ABNORMAL HIGH (ref 11.6–15.2)

## 2013-12-01 LAB — TYPE AND SCREEN
ABO/RH(D): O POS
ANTIBODY SCREEN: NEGATIVE

## 2013-12-01 LAB — APTT: aPTT: 33 seconds (ref 24–37)

## 2013-12-01 NOTE — Progress Notes (Signed)
Positive staph PCR. Pt notified and rx for Mupirocin ointment called into [facility] on [address]

## 2013-12-01 NOTE — Progress Notes (Addendum)
Cardiologist is Dr.Klein with last visit in epic from 2015  Echo report in epic from 2007  Heart cath in epic from 2004  Sleep study in epic from 2006  EKG in epic from 10-07-13   Stress test done at least 79yrs ago  Medical Md is Dr.James Jenny Reichmann  Denies CXR in past yr

## 2013-12-01 NOTE — Pre-Procedure Instructions (Signed)
[name]  12/01/2013   Your procedure is scheduled on:  Tues, Mar 31 @ 10:30 AM  Report to [facility]  at 8:30 AM.  Call this number if you have problems the morning of surgery: [phone]   Remember:   Do not eat food or drink liquids after midnight.   Take these medicines the morning of surgery with A SIP OF WATER: Diltiazem(Dilacor),Dofetilide(Tikosyn), and Allegra(Fexofenadine)              Stop taking your Fish Oil. No Goody's,BC's,Aleve,Aspirin,Ibuprofen,or any Herbal Medications   Do not wear jewelry.  Do not wear lotions, powders, or colognes. You may wear deodorant.  Men may shave face and neck.  Do not bring valuables to the hospital.  [facility] is not responsible                  for any belongings or valuables.               Contacts, dentures or bridgework may not be worn into surgery.  Leave suitcase in the car. After surgery it may be brought to your room.  For patients admitted to the hospital, discharge time is determined by your                treatment team.                   Special Instructions:  [place] - Preparing for Surgery  Before surgery, you can play an important role.  Because skin is not sterile, your skin needs to be as free of germs as possible.  You can reduce the number of germs on you skin by washing with CHG (chlorahexidine gluconate) soap before surgery.  CHG is an antiseptic cleaner which kills germs and bonds with the skin to continue killing germs even after washing.  Please DO NOT use if you have an allergy to CHG or antibacterial soaps.  If your skin becomes reddened/irritated stop using the CHG and inform your nurse when you arrive at Short Stay.  Do not shave (including legs and underarms) for at least 48 hours prior to the first CHG shower.  You may shave your face.  Please follow these instructions carefully:   1.  Shower with CHG Soap the night before surgery and the                                morning of  Surgery.  2.  If you choose to wash your hair, wash your hair first as usual with your       normal shampoo.  3.  After you shampoo, rinse your hair and body thoroughly to remove the                      Shampoo.  4.  Use CHG as you would any other liquid soap.  You can apply chg directly       to the skin and wash gently with scrungie or a clean washcloth.  5.  Apply the CHG Soap to your body ONLY FROM THE NECK DOWN.        Do not use on open wounds or open sores.  Avoid contact with your eyes,       ears, mouth and genitals (private parts).  Wash genitals (private parts)       with your normal soap.  6.  Wash thoroughly, paying special attention to the area where your surgery        will be performed.  7.  Thoroughly rinse your body with warm water from the neck down.  8.  DO NOT shower/wash with your normal soap after using and rinsing off       the CHG Soap.  9.  Pat yourself dry with a clean towel.            10.  Wear clean pajamas.            11.  Place clean sheets on your bed the night of your first shower and do not        sleep with pets.  Day of Surgery  Do not apply any lotions/deoderants the morning of surgery.  Please wear clean clothes to the hospital/surgery center.     Please read over the following fact sheets that you were given: Pain Booklet, Coughing and Deep Breathing, Blood Transfusion Information, MRSA Information and Surgical Site Infection Prevention

## 2013-12-03 NOTE — Progress Notes (Addendum)
Anesthesia Chart Review:  Patient is a 73 year old male scheduled for left THA on 12/09/13 by Dr. Rhona [name].    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 PCP is Dr. Cathlean [name].  Cardiologist is Dr. Caryl [name] who cleared patient with acceptable risk  EKG on 10/08/03 showed a-paced rhythm.  HR was documented as 128 bpm at PAT.  Unfortunately, it was not rechecked. I did call [name].  He is known to have PAF--no known sustained episodes. He knows how to check his pulse and reports HR is currently in the 60's and regular.  He has had no chest pain, SOB, edema, sustained palpitations.  He will continue to monitor. If any new changes he was told that he could come back in to PAT for recheck +/- EKG or contact Dr. Olin [name] office for [facility] interrogation.  He remains on diltiazem and Tikosyn.  Echo on 09/13/05 showed: - Overall left ventricular systolic function was at the lower limits of normal. Left ventricular ejection fraction was estimated , range being 50 % to 55 %.. There was possible hypokinesis of the posterior wall. There was paradoxical motion of the interventricular septum. - The aortic valve was mildly calcified. - There was the appearance of a catheter or pacing wire in the right ventricle. - Patient appeared to be in wide-complex tachycardia during study?? need to change pacing modes Consider reassessing EF when heart rate lower.  Cardiac cath on 11/28/02 showed: 99% proximal LAD, 40% mid LAD, small D1 and D3, large D2 with luminal  irregularities.  25% proximal CX, 50% proximal and mid RCA, 30% acute marginal, EF 55% with mild anterior hypokinesis. S/P Taxus stent to proximal LAD.  No evidence of AAA by duplex on 01/30/12.  CXR on 12/01/13 showed no acute cardiopulmonary abnormality seen.  Preoperative CXR and labs noted. No orders were at PAT, so additional labs if ordered by Dr. Rhona [name] will need to be done on the day of surgery.    Further evaluation by his assigned anesthesiologist on the day of surgery.  If no acute changes in his CV status then I would anticipate that he could proceed as planned.  Dr. Rhona [name] talked to him about GA versus spinal anesthesia.  I told patient I thought his anesthesiologist would likely prefer GA since he has been on Eliquis (to stop two days prior) but that he could further discuss on the day of surgery.  At this point, he did not really have a preference.    [name] [facility]/Anesthesiology Phone [phone] 12/04/2013 10:46 AM

## 2013-12-04 NOTE — H&P (Signed)
TOTAL HIP ADMISSION H&P  Patient is admitted for left total hip arthroplasty.  Subjective:  Chief Complaint: left hip pain  HPI: [name], 73 y.o. male, has a history of pain and functional disability in the left hip(s) due to arthritis and patient has failed non-surgical conservative treatments for greater than 12 weeks to include corticosteriod injections, supervised PT with diminished ADL's post treatment, weight reduction as appropriate and activity modification.  Onset of symptoms was gradual starting 4 years ago with gradually worsening course since that time.The patient noted no past surgery on the left hip(s).  Patient currently rates pain in the left hip at 9 out of 10 with activity. Patient has night pain, worsening of pain with activity and weight bearing, trendelenberg gait, pain that interfers with activities of daily living and pain with passive range of motion. Patient has evidence of subchondral sclerosis, periarticular osteophytes and joint space narrowing by imaging studies. This condition presents safety issues increasing the risk of falls. This patient has had no.  There is no current active infection.  Patient Active Problem List   Diagnosis Date Noted  . 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 Osteoarthritis of left hip 11/05/2013  . Chronic left sacroiliac joint pain 10/13/2013  . Discoloration of skin of lower leg-Left 04/11/2013  . Cellulitis, toe 09/12/2012  . Allergic rhinitis, cause unspecified 06/22/2012  . Preventative health care 06/21/2012  . Chronic sinusitis 06/21/2012  . Aneurysm artery, popliteal 01/30/2012  . Erectile dysfunction 02/21/2011  . 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 CERVICAL RADICULOPATHY, LEFT 05/31/2010  . Atrial fibrillation 05/11/2009  . 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 LATERAL EPICONDYLITIS, RIGHT 02/02/2009  . HYPERLIPIDEMIA 01/07/2008  . DEPRESSION 01/07/2008  . OBSTRUCTIVE SLEEP APNEA 01/07/2008  . PERIPHERAL VASCULAR  DISEASE 01/07/2008  . DIVERTICULOSIS, COLON 01/07/2008   Past Medical History  Diagnosis Date  . Hyperlipidemia     takes Tricor and Lipitor daily  . Coronary artery disease     LAD stenting 2004 non-DES  . Atrial fibrillation     takes Tikosyn and Eliquis daily  . Peripheral vascular disease   . Allergic rhinitis     takes Allegra daily  . Arthritis   . Bilateral popliteal artery aneurysm   . Pacemaker   . Allergic rhinitis, cause unspecified 06/22/2012  . Insomnia     takes Ambien nightly as needed  . OSA (obstructive sleep apnea)     CPAP  . Sinus infection     couple of weeks ago treated with Augmentin  . Pleurisy     early 58's  . 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 Joint pain   . Joint swelling   . Back pain     from hip pain  . Dysphagia     occasionally  . History of colon polyps   . Diverticulosis   . Urinary urgency   . Depression     took Zoloft 46yrs ago but nothing now    Past Surgical History  Procedure Laterality Date  . Inguinal hernia repair Right   . Stent (unspec)      x2  . Pacemaker insertion      due to bradycardia  . Right and left fem-pop bypass    . Hand surgery    . Left knee surgery    . Cardiac catheterization  2004  . Hand and arm surgery Right     as a teenager  . Turbinate reduction    .  Wisdom extracted     . Colonoscopy      No prescriptions prior to admission   Allergies  Allergen Reactions  . Ace Inhibitors     REACTION: cough  . Amiodarone     REACTION: peripheral neuropathy  . Mometasone Furoate Other (See Comments)    (Nasonex) Causes nasal bleeding and nose bleeds  . Niacin     REACTION: irregular heartbeat    History  Substance Use Topics  . Smoking status: Never Smoker   . Smokeless tobacco: Never Used  . Alcohol Use: No     Comment: nothing since 2000    Family History  Problem Relation Age of Onset  . Diabetes Other     family hx  . Sleep apnea Other     family hx  .  Diabetes Mother   . Heart disease Mother   . Heart disease Father   . Heart attack Father   . Stroke Father   . Aneurysm Father     bilat pop art aneurysms     Review of Systems  Constitutional: Negative.   HENT: Negative.   Eyes: Negative.   Respiratory: Negative.   Cardiovascular: Negative.        History of A. fib  Gastrointestinal: Negative.   Genitourinary: Negative.   Musculoskeletal: Positive for joint pain.  Skin: Negative.   Neurological: Negative.   Endo/Heme/Allergies: Negative.   Psychiatric/Behavioral: Negative.     Objective:  Physical Exam  Constitutional: He appears well-developed.  HENT:  Head: Normocephalic.  Eyes: Pupils are equal, round, and reactive to light.  Neck: Normal range of motion.  Cardiovascular: Normal rate.   Respiratory: Effort normal.  GI: Soft.  Musculoskeletal:  Walks with a slight antalgic gait.  Left hip exam: No rotation in the attempts are very painful.  Slight hip flexion contracture.  Forward flexion is about 100.  Left leg is about 1 cm short compared to right  Neurological: He is alert.  Skin: Skin is warm.  Psychiatric: He has a normal mood and affect.    Vital signs in last 24 hours:    Labs:   Estimated body mass index is 25.63 kg/(m^2) as calculated from the following:   Height as of 10/07/13: 6\' 3"  (1.905 m).   Weight as of 11/05/13: 93.024 kg (205 lb 1.3 oz).   Imaging Review Plain radiographs demonstrate severe degenerative joint disease of the left hip(s). The bone quality appears to be good for age and reported activity level.  Assessment/Plan:  End stage arthritis, left hip(s)  The patient history, physical examination, clinical judgement of the provider and imaging studies are consistent with end stage degenerative joint disease of the left hip(s) and total hip arthroplasty is deemed medically necessary. The treatment options including medical management, injection therapy, arthroscopy and arthroplasty  were discussed at length. The risks and benefits of total hip arthroplasty were presented and reviewed. The risks due to aseptic loosening, infection, stiffness, dislocation/subluxation,  thromboembolic complications and other imponderables were discussed.  The patient acknowledged the explanation, agreed to proceed with the plan and consent was signed. Patient is being admitted for inpatient treatment for surgery, pain control, PT, OT, prophylactic antibiotics, VTE prophylaxis, progressive ambulation and ADL's and discharge planning.The patient is planning to be discharged home with home health services

## 2013-12-09 ENCOUNTER — Inpatient Hospital Stay (HOSPITAL_COMMUNITY)
Admission: RE | Admit: 2013-12-09 | Discharge: 2013-12-11 | DRG: 470 | Disposition: A | Discharge status: Home Health | Payer: Medicare Other | Source: Ambulatory Visit | Attending: Orthopaedic Surgery | Admitting: Orthopaedic Surgery

## 2013-12-09 ENCOUNTER — Inpatient Hospital Stay (HOSPITAL_COMMUNITY): Payer: Medicare Other

## 2013-12-09 ENCOUNTER — Encounter (HOSPITAL_COMMUNITY): Payer: Self-pay | Admitting: *Deleted

## 2013-12-09 ENCOUNTER — Encounter (HOSPITAL_COMMUNITY): Payer: Medicare Other | Admitting: Vascular Surgery

## 2013-12-09 ENCOUNTER — Inpatient Hospital Stay (HOSPITAL_COMMUNITY): Payer: Medicare Other | Admitting: Anesthesiology

## 2013-12-09 ENCOUNTER — Encounter (HOSPITAL_COMMUNITY)
Admission: RE | Disposition: A | Discharge status: Home Health | Payer: Self-pay | Source: Ambulatory Visit | Attending: Orthopaedic Surgery

## 2013-12-09 DIAGNOSIS — J309 Allergic rhinitis, unspecified: Secondary | ICD-10-CM | POA: Diagnosis present

## 2013-12-09 DIAGNOSIS — I2510 Atherosclerotic heart disease of native coronary artery without angina pectoris: Secondary | ICD-10-CM | POA: Diagnosis present

## 2013-12-09 DIAGNOSIS — Z860100 Personal history of colon polyps, unspecified: Secondary | ICD-10-CM

## 2013-12-09 DIAGNOSIS — K5730 Diverticulosis of large intestine without perforation or abscess without bleeding: Secondary | ICD-10-CM | POA: Diagnosis present

## 2013-12-09 DIAGNOSIS — Z8249 Family history of ischemic heart disease and other diseases of the circulatory system: Secondary | ICD-10-CM

## 2013-12-09 DIAGNOSIS — N529 Male erectile dysfunction, unspecified: Secondary | ICD-10-CM | POA: Diagnosis present

## 2013-12-09 DIAGNOSIS — G4733 Obstructive sleep apnea (adult) (pediatric): Secondary | ICD-10-CM | POA: Diagnosis present

## 2013-12-09 DIAGNOSIS — M169 Osteoarthritis of hip, unspecified: Secondary | ICD-10-CM | POA: Diagnosis present

## 2013-12-09 DIAGNOSIS — E785 Hyperlipidemia, unspecified: Secondary | ICD-10-CM | POA: Diagnosis present

## 2013-12-09 DIAGNOSIS — Z79899 Other long term (current) drug therapy: Secondary | ICD-10-CM

## 2013-12-09 DIAGNOSIS — Z888 Allergy status to other drugs, medicaments and biological substances status: Secondary | ICD-10-CM

## 2013-12-09 DIAGNOSIS — Z7901 Long term (current) use of anticoagulants: Secondary | ICD-10-CM

## 2013-12-09 DIAGNOSIS — G4700 Insomnia, unspecified: Secondary | ICD-10-CM | POA: Diagnosis present

## 2013-12-09 DIAGNOSIS — M533 Sacrococcygeal disorders, not elsewhere classified: Secondary | ICD-10-CM | POA: Diagnosis present

## 2013-12-09 DIAGNOSIS — Z833 Family history of diabetes mellitus: Secondary | ICD-10-CM

## 2013-12-09 DIAGNOSIS — I739 Peripheral vascular disease, unspecified: Secondary | ICD-10-CM | POA: Diagnosis present

## 2013-12-09 DIAGNOSIS — I4891 Unspecified atrial fibrillation: Secondary | ICD-10-CM | POA: Diagnosis present

## 2013-12-09 DIAGNOSIS — Z823 Family history of stroke: Secondary | ICD-10-CM

## 2013-12-09 DIAGNOSIS — M1610 Unilateral primary osteoarthritis, unspecified hip: Principal | ICD-10-CM | POA: Diagnosis present

## 2013-12-09 DIAGNOSIS — Z9861 Coronary angioplasty status: Secondary | ICD-10-CM

## 2013-12-09 DIAGNOSIS — Z950 Presence of cardiac pacemaker: Secondary | ICD-10-CM

## 2013-12-09 DIAGNOSIS — G5790 Unspecified mononeuropathy of unspecified lower limb: Secondary | ICD-10-CM | POA: Diagnosis present

## 2013-12-09 HISTORY — PX: JOINT REPLACEMENT: SHX530

## 2013-12-09 HISTORY — PX: TOTAL HIP ARTHROPLASTY: SHX124

## 2013-12-09 SURGERY — ARTHROPLASTY, HIP, TOTAL, ANTERIOR APPROACH
Anesthesia: General | Site: Hip | Laterality: Left

## 2013-12-09 MED ORDER — ROCURONIUM BROMIDE 100 MG/10ML IV SOLN
INTRAVENOUS | Status: DC | PRN
  Administered 2013-12-09: 50 mg via INTRAVENOUS
  Administered 2013-12-09: 20 mg via INTRAVENOUS

## 2013-12-09 MED ORDER — DOFETILIDE 500 MCG PO CAPS
500.0000 ug | ORAL_CAPSULE | Freq: Two times a day (BID) | ORAL | Status: DC
  Administered 2013-12-09 – 2013-12-11 (×4): 500 ug via ORAL
  Filled 2013-12-09 (×5): qty 1

## 2013-12-09 MED ORDER — HYDROMORPHONE HCL PF 1 MG/ML IJ SOLN
INTRAMUSCULAR | Status: AC
  Filled 2013-12-09: qty 1

## 2013-12-09 MED ORDER — MAGNESIUM 250 MG PO TABS: 1.0000 | ORAL_TABLET | Freq: Two times a day (BID) | ORAL | Status: DC

## 2013-12-09 MED ORDER — NEOSTIGMINE METHYLSULFATE 1 MG/ML IJ SOLN
INTRAMUSCULAR | Status: DC | PRN
  Administered 2013-12-09: 4 mg via INTRAVENOUS

## 2013-12-09 MED ORDER — ALBUMIN HUMAN 5 % IV SOLN
INTRAVENOUS | Status: DC | PRN
  Administered 2013-12-09: 13:00:00 via INTRAVENOUS

## 2013-12-09 MED ORDER — BISACODYL 5 MG PO TBEC: 5.0000 mg | DELAYED_RELEASE_TABLET | Freq: Every day | ORAL | Status: DC | PRN

## 2013-12-09 MED ORDER — PROPOFOL 10 MG/ML IV BOLUS
INTRAVENOUS | Status: AC
  Filled 2013-12-09: qty 20

## 2013-12-09 MED ORDER — CEFAZOLIN SODIUM-DEXTROSE 2-3 GM-% IV SOLR
INTRAVENOUS | Status: DC | PRN
  Administered 2013-12-09: 2 g via INTRAVENOUS

## 2013-12-09 MED ORDER — METOCLOPRAMIDE HCL 5 MG/ML IJ SOLN: 5.0000 mg | Freq: Three times a day (TID) | INTRAMUSCULAR | Status: DC | PRN

## 2013-12-09 MED ORDER — ACETAMINOPHEN 325 MG PO TABS
650.0000 mg | ORAL_TABLET | Freq: Four times a day (QID) | ORAL | Status: DC | PRN
  Administered 2013-12-10: 650 mg via ORAL
  Filled 2013-12-09: qty 2

## 2013-12-09 MED ORDER — ARTIFICIAL TEARS OP OINT
TOPICAL_OINTMENT | OPHTHALMIC | Status: DC | PRN
  Administered 2013-12-09: 1 via OPHTHALMIC

## 2013-12-09 MED ORDER — ONDANSETRON HCL 4 MG/2ML IJ SOLN
INTRAMUSCULAR | Status: AC
  Filled 2013-12-09: qty 2

## 2013-12-09 MED ORDER — LORATADINE 10 MG PO TABS
10.0000 mg | ORAL_TABLET | Freq: Every day | ORAL | Status: DC
  Administered 2013-12-10 – 2013-12-11 (×2): 10 mg via ORAL
  Filled 2013-12-09 (×2): qty 1

## 2013-12-09 MED ORDER — ROCURONIUM BROMIDE 50 MG/5ML IV SOLN
INTRAVENOUS | Status: AC
  Filled 2013-12-09: qty 2

## 2013-12-09 MED ORDER — METHOCARBAMOL 500 MG PO TABS
500.0000 mg | ORAL_TABLET | Freq: Four times a day (QID) | ORAL | Status: DC | PRN
  Administered 2013-12-09 – 2013-12-11 (×4): 500 mg via ORAL
  Filled 2013-12-09 (×5): qty 1

## 2013-12-09 MED ORDER — ACETAMINOPHEN 650 MG RE SUPP: 650.0000 mg | Freq: Four times a day (QID) | RECTAL | Status: DC | PRN

## 2013-12-09 MED ORDER — OXYCODONE HCL 5 MG PO TABS
ORAL_TABLET | ORAL | Status: AC
  Filled 2013-12-09: qty 1

## 2013-12-09 MED ORDER — ONDANSETRON HCL 4 MG PO TABS
4.0000 mg | ORAL_TABLET | Freq: Four times a day (QID) | ORAL | Status: DC | PRN
  Administered 2013-12-10 (×2): 4 mg via ORAL
  Filled 2013-12-09: qty 1

## 2013-12-09 MED ORDER — DEXAMETHASONE SODIUM PHOSPHATE 10 MG/ML IJ SOLN
INTRAMUSCULAR | Status: AC
  Filled 2013-12-09: qty 1

## 2013-12-09 MED ORDER — PHENYLEPHRINE HCL 10 MG/ML IJ SOLN
INTRAMUSCULAR | Status: AC
  Filled 2013-12-09: qty 1

## 2013-12-09 MED ORDER — FERROUS SULFATE 325 (65 FE) MG PO TABS
325.0000 mg | ORAL_TABLET | Freq: Two times a day (BID) | ORAL | Status: DC
  Administered 2013-12-11: 325 mg via ORAL
  Filled 2013-12-09 (×5): qty 1

## 2013-12-09 MED ORDER — MENTHOL 3 MG MT LOZG: 1.0000 | LOZENGE | OROMUCOSAL | Status: DC | PRN

## 2013-12-09 MED ORDER — METOCLOPRAMIDE HCL 5 MG/ML IJ SOLN
INTRAMUSCULAR | Status: AC
  Filled 2013-12-09: qty 2

## 2013-12-09 MED ORDER — PHENYLEPHRINE HCL 10 MG/ML IJ SOLN
INTRAMUSCULAR | Status: DC | PRN
  Administered 2013-12-09: 80 ug via INTRAVENOUS
  Administered 2013-12-09 (×2): 40 ug via INTRAVENOUS

## 2013-12-09 MED ORDER — DEXTROSE 5 % IV SOLN
500.0000 mg | Freq: Four times a day (QID) | INTRAVENOUS | Status: DC | PRN
  Administered 2013-12-09: 500 mg via INTRAVENOUS
  Filled 2013-12-09: qty 5

## 2013-12-09 MED ORDER — METOCLOPRAMIDE HCL 5 MG/ML IJ SOLN
10.0000 mg | Freq: Once | INTRAMUSCULAR | Status: AC | PRN
  Administered 2013-12-09: 10 mg via INTRAVENOUS

## 2013-12-09 MED ORDER — PROPOFOL 10 MG/ML IV BOLUS
INTRAVENOUS | Status: DC | PRN
  Administered 2013-12-09: 140 mg via INTRAVENOUS

## 2013-12-09 MED ORDER — HYDROMORPHONE HCL PF 1 MG/ML IJ SOLN
0.2500 mg | INTRAMUSCULAR | Status: DC | PRN
  Administered 2013-12-09: 0.5 mg via INTRAVENOUS
  Administered 2013-12-09 (×2): 0.25 mg via INTRAVENOUS
  Administered 2013-12-09 (×2): 0.5 mg via INTRAVENOUS

## 2013-12-09 MED ORDER — OXYCODONE HCL 5 MG/5ML PO SOLN: 5.0000 mg | Freq: Once | ORAL | Status: AC | PRN

## 2013-12-09 MED ORDER — METOCLOPRAMIDE HCL 10 MG PO TABS: 5.0000 mg | ORAL_TABLET | Freq: Three times a day (TID) | ORAL | Status: DC | PRN

## 2013-12-09 MED ORDER — DIPHENHYDRAMINE HCL 12.5 MG/5ML PO ELIX: 12.5000 mg | ORAL_SOLUTION | ORAL | Status: DC | PRN

## 2013-12-09 MED ORDER — CEFAZOLIN SODIUM-DEXTROSE 2-3 GM-% IV SOLR
2.0000 g | Freq: Four times a day (QID) | INTRAVENOUS | Status: AC
  Administered 2013-12-09 – 2013-12-10 (×2): 2 g via INTRAVENOUS
  Filled 2013-12-09 (×2): qty 50

## 2013-12-09 MED ORDER — FENTANYL CITRATE 0.05 MG/ML IJ SOLN
INTRAMUSCULAR | Status: DC | PRN
  Administered 2013-12-09: 100 ug via INTRAVENOUS
  Administered 2013-12-09 (×2): 50 ug via INTRAVENOUS

## 2013-12-09 MED ORDER — GLYCOPYRROLATE 0.2 MG/ML IJ SOLN
INTRAMUSCULAR | Status: DC | PRN
  Administered 2013-12-09: .8 mg via INTRAVENOUS

## 2013-12-09 MED ORDER — MIDAZOLAM HCL 5 MG/5ML IJ SOLN
INTRAMUSCULAR | Status: DC | PRN
  Administered 2013-12-09: 2 mg via INTRAVENOUS

## 2013-12-09 MED ORDER — ONDANSETRON HCL 4 MG/2ML IJ SOLN
4.0000 mg | Freq: Four times a day (QID) | INTRAMUSCULAR | Status: DC | PRN
  Filled 2013-12-09: qty 2

## 2013-12-09 MED ORDER — ZOLPIDEM TARTRATE 5 MG PO TABS
2.5000 mg | ORAL_TABLET | Freq: Every day | ORAL | Status: DC
  Administered 2013-12-09: 2.5 mg via ORAL
  Administered 2013-12-10: 5 mg via ORAL
  Filled 2013-12-09 (×2): qty 1

## 2013-12-09 MED ORDER — LACTATED RINGERS IV SOLN
INTRAVENOUS | Status: DC | PRN
  Administered 2013-12-09 (×3): via INTRAVENOUS

## 2013-12-09 MED ORDER — FENOFIBRATE 160 MG PO TABS
160.0000 mg | ORAL_TABLET | Freq: Every day | ORAL | Status: DC
  Administered 2013-12-09 – 2013-12-11 (×3): 160 mg via ORAL
  Filled 2013-12-09 (×3): qty 1

## 2013-12-09 MED ORDER — NEOSTIGMINE METHYLSULFATE 1 MG/ML IJ SOLN
INTRAMUSCULAR | Status: AC
  Filled 2013-12-09: qty 10

## 2013-12-09 MED ORDER — PHENOL 1.4 % MT LIQD: 1.0000 | OROMUCOSAL | Status: DC | PRN

## 2013-12-09 MED ORDER — MAGNESIUM OXIDE 400 (241.3 MG) MG PO TABS
200.0000 mg | ORAL_TABLET | Freq: Two times a day (BID) | ORAL | Status: DC
  Administered 2013-12-09 – 2013-12-11 (×4): 200 mg via ORAL
  Filled 2013-12-09 (×5): qty 0.5

## 2013-12-09 MED ORDER — LIDOCAINE HCL (CARDIAC) 20 MG/ML IV SOLN
INTRAVENOUS | Status: AC
  Filled 2013-12-09: qty 5

## 2013-12-09 MED ORDER — LACTATED RINGERS IV SOLN
INTRAVENOUS | Status: DC
Start: 2013-12-09 — End: 2013-12-10
  Administered 2013-12-09: 18:00:00 via INTRAVENOUS

## 2013-12-09 MED ORDER — DOCUSATE SODIUM 100 MG PO CAPS
100.0000 mg | ORAL_CAPSULE | Freq: Two times a day (BID) | ORAL | Status: DC
  Administered 2013-12-09 – 2013-12-11 (×4): 100 mg via ORAL
  Filled 2013-12-09 (×5): qty 1

## 2013-12-09 MED ORDER — ONDANSETRON HCL 4 MG/2ML IJ SOLN
INTRAMUSCULAR | Status: DC | PRN
  Administered 2013-12-09: 4 mg via INTRAVENOUS

## 2013-12-09 MED ORDER — LYSINE 500 MG PO CAPS: 1.0000 | ORAL_CAPSULE | Freq: Three times a day (TID) | ORAL | Status: DC

## 2013-12-09 MED ORDER — HYDROCODONE-ACETAMINOPHEN 5-325 MG PO TABS
1.0000 | ORAL_TABLET | ORAL | Status: DC | PRN
  Administered 2013-12-09 – 2013-12-11 (×7): 2 via ORAL
  Filled 2013-12-09 (×7): qty 2

## 2013-12-09 MED ORDER — ATORVASTATIN CALCIUM 40 MG PO TABS
40.0000 mg | ORAL_TABLET | Freq: Every day | ORAL | Status: DC
  Administered 2013-12-09 – 2013-12-11 (×3): 40 mg via ORAL
  Filled 2013-12-09 (×3): qty 1

## 2013-12-09 MED ORDER — FENTANYL CITRATE 0.05 MG/ML IJ SOLN
INTRAMUSCULAR | Status: AC
  Filled 2013-12-09: qty 5

## 2013-12-09 MED ORDER — APIXABAN 5 MG PO TABS
5.0000 mg | ORAL_TABLET | Freq: Two times a day (BID) | ORAL | Status: DC
  Administered 2013-12-09 – 2013-12-11 (×4): 5 mg via ORAL
  Filled 2013-12-09 (×5): qty 1

## 2013-12-09 MED ORDER — HYDROMORPHONE HCL PF 1 MG/ML IJ SOLN: 0.5000 mg | INTRAMUSCULAR | Status: DC | PRN

## 2013-12-09 MED ORDER — POTASSIUM CHLORIDE CRYS ER 10 MEQ PO TBCR
10.0000 meq | EXTENDED_RELEASE_TABLET | Freq: Two times a day (BID) | ORAL | Status: DC
  Administered 2013-12-09 – 2013-12-11 (×4): 10 meq via ORAL
  Filled 2013-12-09 (×5): qty 1

## 2013-12-09 MED ORDER — 0.9 % SODIUM CHLORIDE (POUR BTL) OPTIME
TOPICAL | Status: DC | PRN
  Administered 2013-12-09: 1000 mL

## 2013-12-09 MED ORDER — OXYCODONE HCL 5 MG PO TABS
5.0000 mg | ORAL_TABLET | Freq: Once | ORAL | Status: AC | PRN
  Administered 2013-12-09: 5 mg via ORAL

## 2013-12-09 MED ORDER — ALUM & MAG HYDROXIDE-SIMETH 200-200-20 MG/5ML PO SUSP: 30.0000 mL | ORAL | Status: DC | PRN

## 2013-12-09 MED ORDER — LIDOCAINE HCL (CARDIAC) 20 MG/ML IV SOLN
INTRAVENOUS | Status: DC | PRN
  Administered 2013-12-09: 50 mg via INTRAVENOUS

## 2013-12-09 MED ORDER — DEXAMETHASONE SODIUM PHOSPHATE 10 MG/ML IJ SOLN
INTRAMUSCULAR | Status: DC | PRN
  Administered 2013-12-09: 10 mg via INTRAVENOUS

## 2013-12-09 MED ORDER — DILTIAZEM HCL ER 120 MG PO CP24
120.0000 mg | ORAL_CAPSULE | Freq: Every day | ORAL | Status: DC
  Administered 2013-12-10 – 2013-12-11 (×2): 120 mg via ORAL
  Filled 2013-12-09 (×2): qty 1

## 2013-12-09 MED ORDER — MIDAZOLAM HCL 2 MG/2ML IJ SOLN
INTRAMUSCULAR | Status: AC
  Filled 2013-12-09: qty 2

## 2013-12-09 SURGICAL SUPPLY — 48 items
BLADE SAW SGTL 18X1.27X75 (BLADE) ×2 IMPLANT
BLADE SAW SGTL 18X1.27X75MM (BLADE) ×1
BLADE SURG ROTATE 9660 (MISCELLANEOUS) IMPLANT
CANISTER SUCTION 2500CC (MISCELLANEOUS) ×3 IMPLANT
CAPT HIP PF MOP ×3 IMPLANT
CELLS DAT CNTRL 66122 CELL SVR (MISCELLANEOUS) ×1 IMPLANT
COVER SURGICAL LIGHT HANDLE (MISCELLANEOUS) ×3 IMPLANT
DRAPE C-ARM 42X72 X-RAY (DRAPES) ×3 IMPLANT
DRAPE STERI IOBAN 125X83 (DRAPES) ×3 IMPLANT
DRAPE U-SHAPE 47X51 STRL (DRAPES) ×9 IMPLANT
DRSG AQUACEL AG ADV 3.5X10 (GAUZE/BANDAGES/DRESSINGS) ×3 IMPLANT
DURAPREP 26ML APPLICATOR (WOUND CARE) ×3 IMPLANT
ELECT BLADE 4.0 EZ CLEAN MEGAD (MISCELLANEOUS)
ELECT CAUTERY BLADE 6.4 (BLADE) ×3 IMPLANT
ELECT REM PT RETURN 9FT ADLT (ELECTROSURGICAL) ×3
ELECTRODE BLDE 4.0 EZ CLN MEGD (MISCELLANEOUS) IMPLANT
ELECTRODE REM PT RTRN 9FT ADLT (ELECTROSURGICAL) ×1 IMPLANT
FACESHIELD LNG OPTICON STERILE (SAFETY) ×9 IMPLANT
GLOVE BIO SURGEON STRL SZ8 (GLOVE) ×3 IMPLANT
GLOVE BIO SURGEON STRL SZ8.5 (GLOVE) ×3 IMPLANT
GLOVE BIOGEL PI IND STRL 8 (GLOVE) ×1 IMPLANT
GLOVE BIOGEL PI IND STRL 8.5 (GLOVE) ×1 IMPLANT
GLOVE BIOGEL PI INDICATOR 8 (GLOVE) ×2
GLOVE BIOGEL PI INDICATOR 8.5 (GLOVE) ×2
GOWN STRL REUS W/ TWL LRG LVL3 (GOWN DISPOSABLE) ×2 IMPLANT
GOWN STRL REUS W/ TWL XL LVL3 (GOWN DISPOSABLE) ×1 IMPLANT
GOWN STRL REUS W/TWL LRG LVL3 (GOWN DISPOSABLE) ×4
GOWN STRL REUS W/TWL XL LVL3 (GOWN DISPOSABLE) ×2
KIT BASIN OR (CUSTOM PROCEDURE TRAY) ×3 IMPLANT
KIT ROOM TURNOVER OR (KITS) ×3 IMPLANT
MANIFOLD NEPTUNE II (INSTRUMENTS) ×3 IMPLANT
NS IRRIG 1000ML POUR BTL (IV SOLUTION) ×3 IMPLANT
PACK TOTAL JOINT (CUSTOM PROCEDURE TRAY) ×3 IMPLANT
PAD ARMBOARD 7.5X6 YLW CONV (MISCELLANEOUS) ×6 IMPLANT
RTRCTR WOUND ALEXIS 18CM MED (MISCELLANEOUS) ×3
STAPLER VISISTAT 35W (STAPLE) ×3 IMPLANT
SUT ETHIBOND NAB CT1 #1 30IN (SUTURE) ×6 IMPLANT
SUT VIC AB 0 CT1 27 (SUTURE) ×2
SUT VIC AB 0 CT1 27XBRD ANBCTR (SUTURE) ×1 IMPLANT
SUT VIC AB 1 CT1 27 (SUTURE)
SUT VIC AB 1 CT1 27XBRD ANBCTR (SUTURE) IMPLANT
SUT VIC AB 2-0 CT1 27 (SUTURE) ×2
SUT VIC AB 2-0 CT1 TAPERPNT 27 (SUTURE) ×1 IMPLANT
SUT VLOC 180 0 24IN GS25 (SUTURE) ×3 IMPLANT
TOWEL OR 17X24 6PK STRL BLUE (TOWEL DISPOSABLE) ×3 IMPLANT
TOWEL OR 17X26 10 PK STRL BLUE (TOWEL DISPOSABLE) ×3 IMPLANT
TRAY FOLEY CATH 14FR (SET/KITS/TRAYS/PACK) IMPLANT
WATER STERILE IRR 1000ML POUR (IV SOLUTION) IMPLANT

## 2013-12-09 NOTE — Op Note (Addendum)
PRE-OP DIAGNOSIS:  LEFT HIP DEGENERATIVE JOINT DISEASE POST-OP DIAGNOSIS: same PROCEDURE:  LEFT TOTAL HIP ARTHROPLASTY ANTERIOR APPROACH ANESTHESIA:  General SURGEON:  Melrose Nakayama MD ASSISTANT:  Loni Dolly PA-C   INDICATIONS FOR PROCEDURE:  The patient is a 73 y.o. male with a long history of a painful hip.  This has persisted despite multiple conservative measures.  The patient has persisted with pain and dysfunction making rest and activity difficult.  A total hip replacement is offered as surgical treatment.  Informed operative consent was obtained after discussion of possible complications including reaction to anesthesia, infection, neurovascular injury, dislocation, DVT, PE, and death.  The importance of the postoperative rehab program to optimize result was stressed with the patient.  SUMMARY OF FINDINGS AND PROCEDURE:  Under general anesthesia through a anterior approach an the Hana table a left THR was performed.  The patient had severe degenerative change and good bone quality.  We used DePuy components to replace the hip and these were size KA14 [place] femur capped with a +12 38mm stainless steel hip ball.  On the acetabular side we used a size 52 Gription shell with a plus 4 neutral polyethylene liner.  We did use a hole eliminator.  Loni Dolly PA-C assisted throughout and was invaluable to the completion of the case in that he helped position and retract while I performed the procedure.  He also closed simultaneously to help minimize OR time.  I used fluoroscopy throughout the case to check position of components and leg lengths and read all these views myself.  DESCRIPTION OF PROCEDURE:  The patient was taken to the OR suite where general anesthetic was applied.  The patient was then positioned on the Hana table supine.  All bony prominences were appropriately padded.  Prep and drape was then performed in normal sterile fashion.  The patient was given Kefzol preoperative antibiotic and  an appropriate time out was performed.  We then took an anterior approach to the right hip.  Dissection was taken through adipose to the tensor fascia lata fascia.  This structure was incised longitudinally and we dissected in the intermuscular interval just medial to this muscle.  Cobra retractors were placed superior and inferior to the femoral neck superficial to the capsule.  A capsular incision was then made and the retractors were placed along the femoral neck.  Xray was brought in to get a good level for the femoral neck cut which was made with an oscillating saw and osteotome.  The femoral head was removed with a corkscrew.  The acetabulum was exposed and some labral tissues were excised. Reaming was taken to the inside wall of the pelvis and sequentially up to 1 mm smaller than the actual component.  A trial of components was done and then the aforementioned acetabular shell was placed in appropriate tilt and anteversion confirmed by fluoroscopy. The liner was placed along with the hole eliminator and attention was turned to the femur.  The leg was brought down and over into adduction and the elevator bar was used to raise the femur up gently in the wound.  The piriformis was released with care taken to preserve the obturator internus attachment and all of the posterior capsule. The femur was reamed and then broached to the appropriate size.  A trial reduction was done and the aforementioned head and neck assembly gave [place] the best stability in extension with external rotation.  Leg lengths were felt to be about equal by fluoroscopic exam.  The  trial components were removed and the wound irrigated.  We then placed the femoral component in appropriate anteversion.  The head was applied to a dry stem neck and the hip again reduced.  It was again stable in the aforementioned position.  The would was irrigated again followed by re-approximation of anterior capsule with ethibond suture. Tensor fascia was repaired  with V-loc suture  followed by subcutaneous closure with #O and #2 undyed vicryl.  Skin was closed with staples followed by a sterile dressing.  EBL and IOF can be obtained from anesthesia records.  DISPOSITION:  The patient was extubated in the OR and taken to PACU in stable condition to be admitted to the Orthopedic Surgery for appropriate post-op care to include perioperative antibiotics and DVT prophylaxis.    ADDENDUM: in the body of the operative note I state that the surgery was being performed on the right hip when it was actually the left hip as described in the rest of the note.  That is incorrect and should read LEFT HIP.

## 2013-12-09 NOTE — Preoperative (Signed)
Beta Blockers   Reason not to administer Beta Blockers:Not Applicable 

## 2013-12-09 NOTE — Anesthesia Postprocedure Evaluation (Signed)
  Anesthesia Post-op Note  Patient: [name]  Procedure(s) Performed: Procedure(s) with comments: TOTAL HIP ARTHROPLASTY ANTERIOR APPROACH (Left) - left anterior total hip arthroplasty  Patient Location: PACU  Anesthesia Type:General  Level of Consciousness: awake and alert   Airway and Oxygen Therapy: Patient Spontanous Breathing  Post-op Pain: mild  Post-op Assessment: Post-op Vital signs reviewed, Patient's Cardiovascular Status Stable, Respiratory Function Stable, Patent Airway, No signs of Nausea or vomiting and Pain level controlled  Post-op Vital Signs: Reviewed and stable  Complications: No apparent anesthesia complications

## 2013-12-09 NOTE — Transfer of Care (Signed)
Immediate Anesthesia Transfer of Care Note  Patient: [name]  Procedure(s) Performed: Procedure(s) with comments: TOTAL HIP ARTHROPLASTY ANTERIOR APPROACH (Left) - left anterior total hip arthroplasty  Patient Location: PACU  Anesthesia Type:General  Level of Consciousness: awake, alert  and oriented  Airway & Oxygen Therapy: Patient Spontanous Breathing and Patient connected to nasal cannula oxygen  Post-op Assessment: Report given to PACU RN, Post -op Vital signs reviewed and stable and Patient moving all extremities X 4  Post vital signs: Reviewed and stable  Complications: No apparent anesthesia complications

## 2013-12-09 NOTE — Progress Notes (Signed)
Patient has home CPAP that he places himself on/off.

## 2013-12-09 NOTE — Progress Notes (Signed)
PHARMACIST - PHYSICIAN ORDER COMMUNICATION  CONCERNING: P&T Medication Policy on Herbal Medications  DESCRIPTION:  This patient's order for:  Lysine  has been noted.  This product(s) is classified as an "herbal" or natural product. Due to a lack of definitive safety studies or FDA approval, nonstandard manufacturing practices, plus the potential risk of unknown drug-drug interactions while on inpatient medications, the Pharmacy and Therapeutics Committee does not permit the use of "herbal" or natural products of this type within [facility].   ACTION TAKEN: The pharmacy department is unable to verify this order at this time and your patient has been informed of this safety policy. Please reevaluate patient's clinical condition at discharge and address if the herbal or natural product(s) should be resumed at that time.   Uvaldo Rising, BCPS  Clinical Pharmacist Pager [phone]  12/09/2013 5:57 PM

## 2013-12-09 NOTE — Anesthesia Preprocedure Evaluation (Addendum)
Anesthesia Evaluation  Patient identified by MRN, date of birth, ID band Patient awake    Reviewed: Allergy & Precautions, H&P , NPO status , Patient's Chart, lab work & pertinent test results, reviewed documented beta blocker date and time   Airway Mallampati: II TM Distance: >3 FB Neck ROM: full    Dental  (+) Teeth Intact, Dental Advidsory Given   Pulmonary sleep apnea and Continuous Positive Airway Pressure Ventilation ,  breath sounds clear to auscultation        Cardiovascular + CAD, + Cardiac Stents and + Peripheral Vascular Disease Atrial Fibrillation + pacemaker Rhythm:regular     Neuro/Psych PSYCHIATRIC DISORDERS Depression  Neuromuscular disease    GI/Hepatic negative GI ROS, Neg liver ROS,   Endo/Other  negative endocrine ROS  Renal/GU negative Renal ROS  negative genitourinary   Musculoskeletal   Abdominal   Peds  Hematology negative hematology ROS (+)   Anesthesia Other Findings See surgeon's H&P   Reproductive/Obstetrics negative OB ROS                          Anesthesia Physical Anesthesia Plan  ASA: III  Anesthesia Plan: General   Post-op Pain Management:    Induction: Intravenous  Airway Management Planned: Oral ETT  Additional Equipment:   Intra-op Plan:   Post-operative Plan: Extubation in OR  Informed Consent: I have reviewed the patients History and Physical, chart, labs and discussed the procedure including the risks, benefits and alternatives for the proposed anesthesia with the patient or authorized representative who has indicated his/her understanding and acceptance.   Dental Advisory Given  Plan Discussed with: CRNA, Surgeon and Anesthesiologist  Anesthesia Plan Comments:        Anesthesia Quick Evaluation

## 2013-12-09 NOTE — Progress Notes (Signed)
Utilization review completed.  

## 2013-12-09 NOTE — Anesthesia Procedure Notes (Signed)
Procedure Name: Intubation Date/Time: 12/09/2013 11:21 AM Performed by: Julian Reil Pre-anesthesia Checklist: Patient identified, Emergency Drugs available, Suction available and Patient being monitored Patient Re-evaluated:Patient Re-evaluated prior to inductionOxygen Delivery Method: Circle system utilized Preoxygenation: Pre-oxygenation with 100% oxygen Intubation Type: IV induction Ventilation: Mask ventilation without difficulty and Oral airway inserted - appropriate to patient size Laryngoscope Size: Mac and 4 Grade View: Grade II Tube type: Oral Tube size: 7.5 mm Number of attempts: 1 Airway Equipment and Method: Stylet Placement Confirmation: ETT inserted through vocal cords under direct vision,  positive ETCO2 and breath sounds checked- equal and bilateral Secured at: 23 cm Tube secured with: Tape Dental Injury: Teeth and Oropharynx as per pre-operative assessment

## 2013-12-09 NOTE — Interval H&P Note (Signed)
History and Physical Interval Note:  12/09/2013 10:23 AM  [name]  has presented today for surgery, with the diagnosis of LEFT HIP DEGENERATIVE JOINT DISEASE  The various methods of treatment have been discussed with the patient and family. After consideration of risks, benefits and other options for treatment, the patient has consented to  Procedure(s): TOTAL HIP ARTHROPLASTY ANTERIOR APPROACH (Left) as a surgical intervention .  The patient's history has been reviewed, patient examined, no change in status, stable for surgery.  I have reviewed the patient's chart and labs.  Questions were answered to the patient's satisfaction.     Vaani Morren G

## 2013-12-10 ENCOUNTER — Encounter (HOSPITAL_COMMUNITY): Payer: Self-pay | Admitting: Orthopaedic Surgery

## 2013-12-10 LAB — BASIC METABOLIC PANEL
BUN: 20 mg/dL (ref 6–23)
CALCIUM: 9 mg/dL (ref 8.4–10.5)
CO2: 27 meq/L (ref 19–32)
Chloride: 101 mEq/L (ref 96–112)
Creatinine, Ser: 1.02 mg/dL (ref 0.50–1.35)
GFR calc Af Amer: 82 mL/min — ABNORMAL LOW (ref >90)
GFR, EST NON AFRICAN AMERICAN: 70 mL/min — AB (ref >90)
GLUCOSE: 148 mg/dL — AB (ref 70–99)
POTASSIUM: 4.6 meq/L (ref 3.7–5.3)
Sodium: 140 mEq/L (ref 137–147)

## 2013-12-10 LAB — CBC
HCT: 32.8 % — ABNORMAL LOW (ref 39.0–52.0)
HEMOGLOBIN: 11.3 g/dL — AB (ref 13.0–17.0)
MCH: 33.4 pg (ref 26.0–34.0)
MCHC: 34.5 g/dL (ref 30.0–36.0)
MCV: 97 fL (ref 78.0–100.0)
PLATELETS: 183 10*3/uL (ref 150–400)
RBC: 3.38 MIL/uL — AB (ref 4.22–5.81)
RDW: 14.1 % (ref 11.5–15.5)
WBC: 11.8 10*3/uL — ABNORMAL HIGH (ref 4.0–10.5)

## 2013-12-10 NOTE — Care Management Note (Signed)
CARE MANAGEMENT NOTE 12/10/2013  Patient:  [name], [name]   Account Number:  [id number]  Date Initiated:  12/10/2013  Documentation initiated by:  Ricki Miller  Subjective/Objective Assessment:   73 yr old male admitted with left hip osteoarthritis. S/p Lefp total anterior hip replacement.     Action/Plan:   case manager spoke with patient and son concerning home health and DME needs at discharge. Choice offered. Referral called to [facility], [place].Pt.has family support at discharge.   Anticipated DC Date:  12/11/2013   Anticipated DC Plan:  [place]  CM consult      [facility] Choice  HOME HEALTH  DURABLE MEDICAL EQUIPMENT   Choice offered to / List presented to:  C-1 Patient   DME arranged  [place]  3-N-1      DME agency  [employer]     HH arranged  HH-2 PT      [place].   Status of service:  Completed, signed off Medicare Important Message given?   (If response is "NO", the following Medicare IM given date fields will be blank) Date Medicare IM given:   Date Additional Medicare IM given:    Discharge Disposition:  [place]  Per UR Regulation:

## 2013-12-10 NOTE — Progress Notes (Signed)
Patient has home CPAP unit in his room that he places on/off himself. 

## 2013-12-10 NOTE — Plan of Care (Signed)
Problem: Consults Goal: Diagnosis- Total Joint Replacement Primary Total Hip     

## 2013-12-10 NOTE — Evaluation (Signed)
Occupational Therapy Evaluation Patient Details Name: [name] MRN: [id number] DOB: 05-17-1941 Today's Date: 12/10/2013    History of Present Illness 73 y.o. TOTAL HIP ARTHROPLASTY ANTERIOR APPROACH (Left)   Clinical Impression   Pt moving well during session. Education provided and feel pt is safe to d/c home, from OT standpoint, with spouse available to assist.    Follow Up Recommendations  No OT follow up;Supervision - Intermittent    Equipment Recommendations  3 in 1 bedside comode    Recommendations for Other Services       Precautions / Restrictions Precautions Precautions: Other (comment) (direct anterior approach-no precautions) Restrictions Weight Bearing Restrictions: Yes LLE Weight Bearing: Weight bearing as tolerated      Mobility Bed Mobility Overal bed mobility: Needs Assistance Bed Mobility: Supine to Sit     Supine to sit: Supervision        Transfers Overall transfer level: Needs assistance Equipment used: Rolling walker (2 wheeled) Transfers: Sit to/from Stand Sit to Stand: Min guard;Supervision         General transfer comment: Cues for technique.    Balance                                    ADL           Lower Body Dressing: Supervision/safety;Sit to/from stand;Set up Toilet Transfer: Ambulation;BSC;Comfort height toilet;Supervision/safety   Tub/ Shower Transfer: Min guard;Ambulation;Rolling walker Functional mobility during ADLs: Supervision/safety;Min guard;Rolling walker General ADL Comments: Educated on dressing technique and recommended sitting for bathing and dressing. Recommended spouse be with him for shower transfer/bathing. Educated on use of bag on walker. Discussed use of 3 in 1. Told pt that sockaid is available if pt has difficulty later with left sock and also talked about long handled sponge. Discussed rugs in house.      Vision                     Perception     Praxis       Pertinent Vitals/Pain Pain 4/10. Repositioned.      Hand Dominance     Extremity/Trunk Assessment Upper Extremity Assessment Upper Extremity Assessment: Overall WFL for tasks assessed   Lower Extremity Assessment Lower Extremity Assessment: Defer to PT evaluation       Communication Communication Communication: No difficulties   Cognition Arousal/Alertness: Awake/alert Behavior During Therapy: WFL for tasks assessed/performed Overall Cognitive Status: Within Functional Limits for tasks assessed                     General Comments       Exercises      Home Living Family/patient expects to be discharged to:: Private residence Living Arrangements: Spouse/significant other Available Help at Discharge: Family;Available 24 hours/day Type of Home: House Home Access: Stairs to enter [employer] of Steps: 3 Entrance Stairs-Rails: Right;Left;Can reach both Home Layout: Two level;Able to live on main level with bedroom/bathroom     Bathroom Shower/Tub: Walk-in shower;Door   [employer] Toilet: Handicapped height     Home Equipment: Financial controller: [employer]        Prior Functioning/Environment Level of Independence: Independent             OT Diagnosis:     OT Problem List:     OT Treatment/Interventions:      OT Goals(Current goals can be found in the care  plan section)    OT Frequency:     Barriers to D/C:            End of Session: Equipment Utilized During Treatment: Gait belt;Rolling walker  Activity Tolerance: Patient tolerated treatment well Patient left: in chair;with call bell/phone within reach;with family/visitor present   Time: 0962-8366 OT Time Calculation (min): 25 min Charges:  OT General Charges $OT Visit: 1 Procedure OT Evaluation $Initial OT Evaluation Tier I: 1 Procedure OT Treatments $Self Care/Home Management : 8-22 mins G-CodesBenito Mccreedy OTR/L [phone] 12/10/2013,  10:27 AM

## 2013-12-10 NOTE — Progress Notes (Signed)
Physical Therapy Treatment Patient Details Name: [name] MRN: [id number] DOB: February 19, 1941 Today's Date: 12/10/2013    History of Present Illness 73 y.o. left direct anterior THA    PT Comments    Pt progressing well, did not ambulate as far, 60', pt sore from first session. Worked on bed mobility, transfers, and exercises. PT to follow.  Follow Up Recommendations  Home health PT;Supervision - Intermittent     Equipment Recommendations  Rolling walker with 5" wheels    Recommendations for Other Services       Precautions / Restrictions Precautions Precautions: None Restrictions Weight Bearing Restrictions: Yes LLE Weight Bearing: Weight bearing as tolerated    Mobility  Bed Mobility Overal bed mobility: Modified Independent             General bed mobility comments: pt able to get in and out of bed independently  Transfers Overall transfer level: Needs assistance Equipment used: Rolling walker (2 wheeled) Transfers: Sit to/from Stand Sit to Stand: Supervision         General transfer comment: pt standing from bed and chair without physical assist, safe hand placement  Ambulation/Gait Ambulation/Gait assistance: Supervision Ambulation Distance (Feet): 60 Feet Assistive device: Rolling walker (2 wheeled) Gait Pattern/deviations: Step-through pattern;Decreased step length - left Gait velocity: decreased   General Gait Details: decreased distance due to soreness   Stairs Stairs: Yes Stairs assistance: Supervision Stair Management: One rail Left;Step to pattern;Forwards Number of Stairs: 5 General stair comments: pt educated on least painful sequencing, safe with stairs and should be safe with only one rail and wall  Wheelchair Mobility    Modified Rankin (Stroke Patients Only)       Balance Overall balance assessment: No apparent balance deficits (not formally assessed)                                  Cognition  Arousal/Alertness: Awake/alert Behavior During Therapy: WFL for tasks assessed/performed Overall Cognitive Status: Within Functional Limits for tasks assessed                      Exercises Total Joint Exercises Ankle Circles/Pumps: AROM;Both;10 reps;Seated Quad Sets: AROM;Both;10 reps;Seated Gluteal Sets: AROM;Both;10 reps;Seated Heel Slides: AROM;Left;10 reps;Supine Hip ABduction/ADduction: AROM;Left;10 reps;Supine Straight Leg Raises: AAROM;Supine;10 reps;Left Long Arc Quad: AROM;Left;10 reps;Seated Bridges: AROM;Left;10 reps;Supine    General Comments        Pertinent Vitals/Pain VSS    Home Living Family/patient expects to be discharged to:: Private residence Living Arrangements: Spouse/significant other Available Help at Discharge: Family;Available 24 hours/day Type of Home: House Home Access: Stairs to enter Entrance Stairs-Rails: Left Home Layout: Two level;Able to live on main level with bedroom/bathroom Home Equipment: Adaptive equipment Additional Comments: RW has already arrived at room. Pt told OT he had bilateral rails at home but later his wife said he actually has one rail and a wall    Prior Function Level of Independence: Independent          PT Goals (current goals can now be found in the care plan section) Acute Rehab PT Goals Patient Stated Goal: return home PT Goal Formulation: With patient Time For Goal Achievement: 12/17/13 Potential to Achieve Goals: Good Progress towards PT goals: Progressing toward goals    Frequency  7X/week    PT Plan Current plan remains appropriate    Co-evaluation  End of Session Equipment Utilized During Treatment: Gait belt Activity Tolerance: Patient tolerated treatment well Patient left: in bed;with call bell/phone within reach     Time: 1542-1610 PT Time Calculation (min): 28 min  Charges:  $Gait Training: 8-22 mins $Therapeutic Exercise: 8-22 mins                    G  Codes:     Leighton Roach, PT  Acute Rehab Services  [phone]  Leighton Roach 12/10/2013, 4:17 PM

## 2013-12-10 NOTE — Evaluation (Signed)
Physical Therapy Evaluation Patient Details Name: [name] MRN: [id number] DOB: 01/12/1941 Today's Date: 12/10/2013   History of Present Illness  73 y.o. TOTAL HIP ARTHROPLASTY ANTERIOR APPROACH (Left)  Clinical Impression  Pt doing very well after surgery yesterday. Ambulated 100' with RW and supervision and ascended 5 steps with rail and supervision. Very tight left hip flexor, will need to be addressed in later setting Recommend HHPT after d/c. PT will follow acutely to increase strength of the LLE as well as independent mobility for d/c home.    Follow Up Recommendations Home health PT;Supervision - Intermittent    Equipment Recommendations  Rolling walker with 5" wheels    Recommendations for Other Services       Precautions / Restrictions Precautions Precautions: Other (comment) (none, direct anterior) Restrictions Weight Bearing Restrictions: Yes LLE Weight Bearing: Weight bearing as tolerated      Mobility  Bed Mobility               General bed mobility comments: pt up in chair  Transfers Overall transfer level: Needs assistance Equipment used: Rolling walker (2 wheeled) Transfers: Sit to/from Stand Sit to Stand: Supervision         General transfer comment: cues for upright posture once standing. Pt used safe hand placement  Ambulation/Gait Ambulation/Gait assistance: Supervision Ambulation Distance (Feet): 200 Feet Assistive device: Rolling walker (2 wheeled) Gait Pattern/deviations: Step-through pattern;Decreased stance time - left;Decreased step length - left Gait velocity: decreased   General Gait Details: pt safe with ambulation, cues for posture, tight left hip flexor evident with terminal stance causing truk to tip fwd. This will need to be addressed more in OP PT  Stairs Stairs: Yes Stairs assistance: Supervision Stair Management: One rail Left;Step to pattern;Forwards Number of Stairs: 5 General stair comments: pt educated on  least painful sequencing, safe with stairs and should be safe with only one rail and wall  Wheelchair Mobility    Modified Rankin (Stroke Patients Only)       Balance Overall balance assessment: No apparent balance deficits (not formally assessed)                                           Pertinent Vitals/Pain VSS    Home Living Family/patient expects to be discharged to:: Private residence Living Arrangements: Spouse/significant other Available Help at Discharge: Family;Available 24 hours/day Type of Home: House Home Access: Stairs to enter Entrance Stairs-Rails: Left Entrance Stairs-Number of Steps: 3 Home Layout: Two level;Able to live on main level with bedroom/bathroom Home Equipment: Adaptive equipment Additional Comments: RW has already arrived at room. Pt told OT he had bilateral rails at home but later his wife said he actually has one rail and a wall    Prior Function Level of Independence: Independent               Hand Dominance        Extremity/Trunk Assessment   Upper Extremity Assessment: Defer to OT evaluation           Lower Extremity Assessment: LLE deficits/detail   LLE Deficits / Details: quad 4/5, hip flex 2/5, tight hip flexor  Cervical / Trunk Assessment: Normal  Communication   Communication: No difficulties  Cognition Arousal/Alertness: Awake/alert Behavior During Therapy: WFL for tasks assessed/performed Overall Cognitive Status: Within Functional Limits for tasks assessed  General Comments      Exercises Total Joint Exercises Ankle Circles/Pumps: AROM;Both;10 reps;Seated Quad Sets: AROM;Both;10 reps;Seated Gluteal Sets: AROM;Both;10 reps;Seated Hip ABduction/ADduction: AROM;Left;10 reps;Seated Straight Leg Raises: AAROM;5 reps;Left;Seated Long Arc Quad: AROM;10 reps;Left;Seated      Assessment/Plan    PT Assessment Patient needs continued PT services  PT Diagnosis  Difficulty walking;Abnormality of gait;Acute pain   PT Problem List Decreased strength;Decreased range of motion;Decreased activity tolerance;Decreased mobility;Decreased knowledge of use of DME;Decreased knowledge of precautions;Pain  PT Treatment Interventions DME instruction;Gait training;Stair training;Functional mobility training;Therapeutic activities;Therapeutic exercise;Patient/family education   PT Goals (Current goals can be found in the Care Plan section) Acute Rehab PT Goals Patient Stated Goal: return home PT Goal Formulation: With patient Time For Goal Achievement: 12/17/13 Potential to Achieve Goals: Good    Frequency 7X/week   Barriers to discharge        Co-evaluation               End of Session Equipment Utilized During Treatment: Gait belt Activity Tolerance: Patient tolerated treatment well Patient left: in chair;with call bell/phone within reach;with family/visitor present Nurse Communication: Mobility status         Time: 4782-9562 PT Time Calculation (min): 37 min   Charges:   PT Evaluation $Initial PT Evaluation Tier I: 1 Procedure PT Treatments $Gait Training: 23-37 mins   PT G Codes:        Leighton Roach, PT  Acute Rehab Services  [place], [place] 12/10/2013, 2:58 PM

## 2013-12-10 NOTE — Progress Notes (Signed)
Subjective: 1 Day Post-Op Procedure(s) (LRB): TOTAL HIP ARTHROPLASTY ANTERIOR APPROACH (Left)  Activity level:  wbat Diet tolerance:  Eating well Voiding:  ok Patient reports pain as 1 on 0-10 scale.    Objective: Vital signs in last 24 hours: Temp:  [97.4 F (36.3 C)-98.6 F (37 C)] 97.4 F (36.3 C) (04/01 0500) Pulse Rate:  [61-79] 65 (04/01 0500) Resp:  [7-23] 18 (04/01 0800) BP: (99-141)/(53-81) 101/81 mmHg (04/01 0500) SpO2:  [97 %-100 %] 100 % (04/01 0500) Weight:  [91.627 kg (202 lb)] 91.627 kg (202 lb) (04/01 0100)  Labs:  Recent Labs  12/10/13 0448  HGB 11.3*    Recent Labs  12/10/13 0448  WBC 11.8*  RBC 3.38*  HCT 32.8*  PLT 183    Recent Labs  12/10/13 0448  NA 140  K 4.6  CL 101  CO2 27  BUN 20  CREATININE 1.02  GLUCOSE 148*  CALCIUM 9.0   No results found for this basename: LABPT, INR,  in the last 72 hours  Physical Exam:  Neurologically intact ABD soft Neurovascular intact Sensation intact distally Dorsiflexion/Plantar flexion intact Incision: dressing C/D/I No cellulitis present  Assessment/Plan:  1 Day Post-Op Procedure(s) (LRB): TOTAL HIP ARTHROPLASTY ANTERIOR APPROACH (Left) Advance diet Up with therapy D/C IV fluids Plan for discharge tomorrow Discharge home with home health Cont Elequis for a fib and DVT prophylaxis  Follow up in office in 2 weeks If Hgb remains stable we will hold Iron tomorrow due to possible GI upset Pt. Using home CPAP     Ainara Eldridge, Larwance Sachs 12/10/2013, 8:04 AM

## 2013-12-11 LAB — CBC
HCT: 31.2 % — ABNORMAL LOW (ref 39.0–52.0)
HEMOGLOBIN: 10.6 g/dL — AB (ref 13.0–17.0)
MCH: 33.3 pg (ref 26.0–34.0)
MCHC: 34 g/dL (ref 30.0–36.0)
MCV: 98.1 fL (ref 78.0–100.0)
Platelets: 161 10*3/uL (ref 150–400)
RBC: 3.18 MIL/uL — ABNORMAL LOW (ref 4.22–5.81)
RDW: 14.4 % (ref 11.5–15.5)
WBC: 9.6 10*3/uL (ref 4.0–10.5)

## 2013-12-11 MED ORDER — METHOCARBAMOL 500 MG PO TABS: 500.0000 mg | ORAL_TABLET | Freq: Four times a day (QID) | ORAL | Status: DC | PRN

## 2013-12-11 MED ORDER — HYDROCODONE-ACETAMINOPHEN 5-325 MG PO TABS: 1.0000 | ORAL_TABLET | Freq: Four times a day (QID) | ORAL | Status: DC | PRN

## 2013-12-11 NOTE — Progress Notes (Signed)
Subjective: 2 Days Post-Op Procedure(s) (LRB): TOTAL HIP ARTHROPLASTY ANTERIOR APPROACH (Left)  Activity level:  wbat Diet tolerance:  Eating well Voiding:  ok Patient reports pain as 1 on 0-10 scale.    Objective: Vital signs in last 24 hours: Temp:  [97.6 F (36.4 C)-98.7 F (37.1 C)] 97.6 F (36.4 C) (04/02 0555) Pulse Rate:  [69-101] 69 (04/02 0930) Resp:  [16-18] 18 (04/02 0930) BP: (107-121)/(49-75) 107/49 mmHg (04/02 0930) SpO2:  [97 %-99 %] 97 % (04/02 0930)  Labs:  Recent Labs  12/10/13 0448 12/11/13 0740  HGB 11.3* 10.6*    Recent Labs  12/10/13 0448 12/11/13 0740  WBC 11.8* 9.6  RBC 3.38* 3.18*  HCT 32.8* 31.2*  PLT 183 161    Recent Labs  12/10/13 0448  NA 140  K 4.6  CL 101  CO2 27  BUN 20  CREATININE 1.02  GLUCOSE 148*  CALCIUM 9.0   No results found for this basename: LABPT, INR,  in the last 72 hours  Physical Exam:  Neurologically intact ABD soft Neurovascular intact Sensation intact distally Dorsiflexion/Plantar flexion intact Incision: dressing C/D/I No cellulitis present  Assessment/Plan:  2 Days Post-Op Procedure(s) (LRB): TOTAL HIP ARTHROPLASTY ANTERIOR APPROACH (Left) Advance diet Up with therapy Discharge home with home health today Continue on Elequis for a-fib and DVT prophylaxis  Follow up in office in 2 weeks     Hitomi Slape, Larwance Sachs 12/11/2013, 10:58 AM

## 2013-12-11 NOTE — Discharge Summary (Signed)
Patient ID: TOBECHUKWU EMMICK MRN: [id number] DOB/AGE: 74-Jul-1942 74 y.o.  Admit date: 12/09/2013 Discharge date: 12/11/2013  Admission Diagnoses:  Principal Problem:   Degenerative joint disease (DJD) of hip Active Problems:   OBSTRUCTIVE SLEEP APNEA   Atrial fibrillation   Discharge Diagnoses:  Same  Past Medical History  Diagnosis Date  . Hyperlipidemia     takes Tricor and Lipitor daily  . Coronary artery disease     LAD stenting 2004 non-DES  . Atrial fibrillation     takes Tikosyn and Eliquis daily  . Peripheral vascular disease   . Allergic rhinitis     takes Allegra daily  . Arthritis   . Bilateral popliteal artery aneurysm   . Pacemaker   . Allergic rhinitis, cause unspecified 06/22/2012  . Insomnia     takes Ambien nightly as needed  . OSA (obstructive sleep apnea)     CPAP  . Sinus infection     couple of weeks ago treated with Augmentin  . Pleurisy     early 61's  . 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 Joint pain   . Joint swelling   . Back pain     from hip pain  . Dysphagia     occasionally  . History of colon polyps   . Diverticulosis   . Urinary urgency   . Depression     took Zoloft 34yrs ago but nothing now    Surgeries: Procedure(s): TOTAL HIP ARTHROPLASTY ANTERIOR APPROACH on 12/09/2013   Consultants:    Discharged Condition: Improved  Hospital Course: EDGAR REISZ is an 73 y.o. male who was admitted 12/09/2013 for operative treatment ofDegenerative joint disease (DJD) of hip. Patient has severe unremitting pain that affects sleep, daily activities, and work/hobbies. After pre-op clearance the patient was taken to the operating room on 12/09/2013 and underwent  Procedure(s): TOTAL HIP ARTHROPLASTY ANTERIOR APPROACH.    Patient was given perioperative antibiotics: Anti-infectives   Start     Dose/Rate Route Frequency Ordered Stop   12/09/13 1800  ceFAZolin (ANCEF) IVPB 2 g/50 mL premix     2 g 100 mL/hr over 30  Minutes Intravenous Every 6 hours 12/09/13 1744 12/10/13 0116       Patient was given sequential compression devices, early ambulation, and chemoprophylaxis to prevent DVT.  Patient used his own home CPAP  Patient benefited maximally from hospital stay and there were no complications.    Recent vital signs: Patient Vitals for the past 24 hrs:  BP Temp Temp src Pulse Resp SpO2  12/11/13 0930 107/49 mmHg - - 69 18 97 %  12/11/13 0800 - - - - 18 -  12/11/13 0555 118/75 mmHg 97.6 F (36.4 C) Oral 101 18 98 %  12/11/13 0400 - - - - 16 -  12/11/13 0000 - - - - 17 -  12/10/13 2000 121/61 mmHg 98.5 F (36.9 C) Oral 85 17 99 %  12/10/13 1521 - - - - 18 -  12/10/13 1400 114/54 mmHg 98.7 F (37.1 C) Oral 70 18 -  12/10/13 1134 - - - - 18 -     Recent laboratory studies:  Recent Labs  12/10/13 0448 12/11/13 0740  WBC 11.8* 9.6  HGB 11.3* 10.6*  HCT 32.8* 31.2*  PLT 183 161  NA 140  --   K 4.6  --   CL 101  --   CO2 27  --   BUN 20  --  CREATININE 1.02  --   GLUCOSE 148*  --   CALCIUM 9.0  --      Discharge Medications:     Medication List         apixaban 5 MG Tabs tablet  Commonly known as:  ELIQUIS  Take 1 tablet (5 mg total) by mouth 2 (two) times daily.     atorvastatin 80 MG tablet  Commonly known as:  LIPITOR  Take 0.5 tablets (40 mg total) by mouth daily.     CALCIUM PO  Take 600 mg by mouth 2 (two) times daily.     diltiazem 120 MG 24 hr capsule  Commonly known as:  DILACOR XR  Take 1 capsule (120 mg total) by mouth daily.     dofetilide 500 MCG capsule  Commonly known as:  TIKOSYN  Take 1 capsule (500 mcg total) by mouth 2 (two) times daily.     fenofibrate 145 MG tablet  Commonly known as:  TRICOR  Take 1 tablet (145 mg total) by mouth daily.     fexofenadine 180 MG tablet  Commonly known as:  ALLEGRA  Take 180 mg by mouth daily.     Fish Oil 1200 MG Caps  Take 1 capsule by mouth daily.     FOLIC ACID PO  Take 1 tablet by mouth daily.      GLUCOSAMINE CHONDROITIN COMPLX PO  Take 1 tablet by mouth 2 (two) times daily.     HYDROcodone-acetaminophen 5-325 MG per tablet  Commonly known as:  NORCO/VICODIN  Take 1-2 tablets by mouth every 6 (six) hours as needed for moderate pain (breakthrough pain).     Lysine 500 MG Caps  Take 1 capsule by mouth 3 (three) times daily.     Magnesium 250 MG Tabs  Take 1 tablet by mouth 2 (two) times daily.     methocarbamol 500 MG tablet  Commonly known as:  ROBAXIN  Take 1 tablet (500 mg total) by mouth every 6 (six) hours as needed for muscle spasms.     potassium chloride 10 MEQ tablet  Commonly known as:  KLOR-CON M10  Take 1 tablet (10 mEq total) by mouth 2 (two) times daily.     tadalafil 20 MG tablet  Commonly known as:  CIALIS  Take 1 tablet (20 mg total) by mouth daily as needed. For ED     triamcinolone 55 MCG/ACT nasal inhaler  Commonly known as:  NASACORT  Place 1 spray into the nose 2 (two) times daily.     VITAMIN B 12 PO  Take 1 tablet by mouth daily.     zolpidem 10 MG tablet  Commonly known as:  AMBIEN  Take 2.5-5 mg by mouth at bedtime as needed. For sleep        Diagnostic Studies: Dg Chest 2 View  12/01/2013   CLINICAL DATA:  Hypertension.  EXAM: CHEST  2 VIEW  COMPARISON:  June 17, 2007.  FINDINGS: The heart size and mediastinal contours are within normal limits. Left-sided pacemaker is unchanged in position. Both lungs are clear. Anterior osteophyte formation of lower thoracic spine is noted.  IMPRESSION: No acute cardiopulmonary abnormality seen.   Electronically Signed   By: Sabino Dick M.D.   On: 12/01/2013 16:27   Dg Hip Operative Left  12/09/2013   CLINICAL DATA:  Total hip arthroplasty  EXAM: OPERATIVE LEFT HIP  COMPARISON:  DG HIP COMPLETE*L* dated 10/13/2013  FINDINGS: Left total hip arthroplasty has been placed. Anatomic alignment.  No breakage or loosening of the hardware.  IMPRESSION: Left total hip arthroplasty anatomically aligned.    Electronically Signed   By: Maryclare Bean M.D.   On: 12/09/2013 15:27    Disposition: 01-Home or Self Care      Discharge Orders   Future Appointments Provider Department Dept Phone   04/17/2014 1:00 PM [facility] [address] [phone]   04/17/2014 1:30 PM [facility] [address] [phone]   04/17/2014 2:30 PM Conrad Cresson, MD [facility] [place] [phone]   Future Orders Complete By Expires   Call MD / Call 911  As directed    Comments:     If you experience chest pain or shortness of breath, CALL 911 and be transported to the hospital emergency room.  If you develope a fever above 101 F, pus (white drainage) or increased drainage or redness at the wound, or calf pain, call your surgeon's office.   Constipation Prevention  As directed    Comments:     Drink plenty of fluids.  Prune juice may be helpful.  You may use a stool softener, such as Colace (over the counter) 100 mg twice a day.  Use MiraLax (over the counter) for constipation as needed.   Diet - low sodium heart healthy  As directed    Discharge instructions  As directed    Comments:     Ice Elevate Continue on elequis Follow up in office 2 weeks post op Reinforce dressing as needed   Increase activity slowly as tolerated  As directed       Follow-up Information   Follow up with DALLDORF,PETER G, MD. Call in 2 weeks.   Specialty:  Orthopedic Surgery   Contact information:   [address] [place] [address] [phone]       Follow up with Belva. (Someone from [facility] will contact you concerning physical therapy start date and time.)    Contact information:   [address] [place] [address] [phone]        Signed: Rich Fuchs 12/11/2013, 10:52 AM

## 2013-12-11 NOTE — Progress Notes (Signed)
Physical Therapy Treatment Patient Details Name: [name] MRN: [id number] DOB: August 23, 1941 Today's Date: 12/11/2013    History of Present Illness 73 y.o. left direct anterior THA    PT Comments    Pt. Making good progress with ambulation and was able to walk around entire unit at a modified independent level.  He anticipates DC today and he has met his acute goals.  Recommend HHPT f/u for continued progression toward independence.    Follow Up Recommendations  Home health PT;Supervision - Intermittent     Equipment Recommendations  Rolling walker with 5" wheels    Recommendations for Other Services       Precautions / Restrictions Precautions Precautions: None Restrictions Weight Bearing Restrictions: Yes LLE Weight Bearing: Weight bearing as tolerated    Mobility  Bed Mobility Overal bed mobility: Modified Independent Bed Mobility: Supine to Sit     Supine to sit: Modified independent (Device/Increase time)     General bed mobility comments: pt able to get in and out of bed independently  Transfers Overall transfer level: Modified independent Equipment used: Rolling walker (2 wheeled) Transfers: Sit to/from Stand Sit to Stand: Modified independent (Device/Increase time)         General transfer comment: Pt. using correct and safest technique to transition sit<>stand, no physical assist needed  Ambulation/Gait Ambulation/Gait assistance: Modified independent (Device/Increase time) Ambulation Distance (Feet): 500 Feet Assistive device: Rolling walker (2 wheeled) Gait Pattern/deviations: Step-through pattern;Decreased step length - right;Decreased step length - left Gait velocity: decreased Gait velocity interpretation: Below normal speed for age/gender General Gait Details: pt. able to walk entire unit this am with no increase in his pain level.  Needed one reminder to push RW a little further forward before taking steps .  He was able to make necessary  changes .  No overt LOB and good self monitoring for posture.     Stairs Stairs: Yes Stairs assistance: Supervision Stair Management: One rail Left;Step to pattern;Forwards Number of Stairs: 5 General stair comments: Pt. able to negotiate steps with left rail only and supervision for safety  Wheelchair Mobility    Modified Rankin (Stroke Patients Only)       Balance                                    Cognition Arousal/Alertness: Awake/alert Behavior During Therapy: WFL for tasks assessed/performed Overall Cognitive Status: Within Functional Limits for tasks assessed                      Exercises      General Comments        Pertinent Vitals/Pain See vitals tab     Home Living                      Prior Function            PT Goals (current goals can now be found in the care plan section) Progress towards PT goals: Progressing toward goals    Frequency  7X/week    PT Plan Current plan remains appropriate    Co-evaluation             End of Session Equipment Utilized During Treatment: Gait belt Activity Tolerance: Patient tolerated treatment well Patient left: in chair;with call bell/phone within reach     Time: 1022-1107 PT Time Calculation (min): 45 min  Charges:  $  Gait Training: 38-52 mins                    G Codes:      Ladona Ridgel 12/11/2013, 11:17 AM Gerlean Ren PT Acute Rehab Services [phone] [place] [phone]

## 2014-01-04 ENCOUNTER — Other Ambulatory Visit: Payer: Self-pay | Admitting: Internal Medicine

## 2014-01-27 ENCOUNTER — Other Ambulatory Visit: Payer: Self-pay | Admitting: *Deleted

## 2014-01-27 ENCOUNTER — Other Ambulatory Visit: Payer: Self-pay | Admitting: Internal Medicine

## 2014-01-27 MED ORDER — DOFETILIDE 500 MCG PO CAPS: ORAL_CAPSULE | ORAL | Status: DC

## 2014-02-03 ENCOUNTER — Ambulatory Visit: Payer: Medicare Other | Admitting: Vascular Surgery

## 2014-03-06 ENCOUNTER — Telehealth: Payer: Self-pay | Admitting: Internal Medicine

## 2014-03-06 NOTE — Telephone Encounter (Signed)
Rec'd from [place] forward 2 pages to Dr. Jenny Reichmann

## 2014-03-24 ENCOUNTER — Other Ambulatory Visit: Payer: Self-pay | Admitting: Orthopaedic Surgery

## 2014-04-06 ENCOUNTER — Encounter: Payer: Self-pay | Admitting: Internal Medicine

## 2014-04-07 ENCOUNTER — Telehealth: Payer: Self-pay | Admitting: Internal Medicine

## 2014-04-07 NOTE — Telephone Encounter (Signed)
New message     Talk to [facility] ---he is having a hip replacement soon

## 2014-04-07 NOTE — Telephone Encounter (Signed)
Pt coming in tomorrow for OV with Dr. Caryl Comes. Will address issues then.

## 2014-04-07 NOTE — Telephone Encounter (Addendum)
Pt c/o several things:  - He states he has had a cough on and off for awhile now and remembers conversation Dr. Caryl Comes and he had discussing possible cause being PVCs. He would like to know if this is the issue.  - He is having hip replacement next month and needs clearance. I explained we will call to arrange appt for this.  - He also c/o of having trouble with rate response and would like device checked.  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 Pt has been taking 2.5 mg BID with no blood and would like Dr. Caryl Comes to advise on this. He also tells me he had urologist consult about 5 years ago for this (when he was taking Coumadin and had same issue) and urologist told him that this was going to happen. Explained that I would get back with him after discussing with Caryl Comes. He is agreeable to this plan.

## 2014-04-08 ENCOUNTER — Encounter: Payer: Self-pay | Admitting: Internal Medicine

## 2014-04-08 ENCOUNTER — Ambulatory Visit (INDEPENDENT_AMBULATORY_CARE_PROVIDER_SITE_OTHER): Payer: Medicare Other | Admitting: Internal Medicine

## 2014-04-08 VITALS — BP 125/70 | HR 68 | Ht 75.0 in | Wt 211.8 lb

## 2014-04-08 DIAGNOSIS — I4891 Unspecified atrial fibrillation: Secondary | ICD-10-CM

## 2014-04-08 DIAGNOSIS — Z950 Presence of cardiac pacemaker: Secondary | ICD-10-CM

## 2014-04-08 DIAGNOSIS — I495 Sick sinus syndrome: Secondary | ICD-10-CM

## 2014-04-08 DIAGNOSIS — I480 Paroxysmal atrial fibrillation: Secondary | ICD-10-CM

## 2014-04-08 LAB — MDC_IDC_ENUM_SESS_TYPE_INCLINIC
Implantable Pulse Generator Model: 359524
Lead Channel Impedance Value: 331 Ohm
Lead Channel Pacing Threshold Amplitude: 0.6 V
Lead Channel Pacing Threshold Amplitude: 0.9 V
Lead Channel Pacing Threshold Pulse Width: 0.4 ms
Lead Channel Sensing Intrinsic Amplitude: 2.4 mV
Lead Channel Sensing Intrinsic Amplitude: 3.4 mV
Lead Channel Setting Pacing Amplitude: 1.6 V
Lead Channel Setting Pacing Amplitude: 2.4 V
MDC IDC MSMT LEADCHNL RA PACING THRESHOLD AMPLITUDE: 0.6 V
MDC IDC MSMT LEADCHNL RA PACING THRESHOLD PULSEWIDTH: 0.4 ms
MDC IDC MSMT LEADCHNL RA PACING THRESHOLD PULSEWIDTH: 0.4 ms
MDC IDC MSMT LEADCHNL RA SENSING INTR AMPL: 2.4 mV
MDC IDC MSMT LEADCHNL RV IMPEDANCE VALUE: 331 Ohm
MDC IDC MSMT LEADCHNL RV PACING THRESHOLD AMPLITUDE: 0.9 V
MDC IDC MSMT LEADCHNL RV PACING THRESHOLD PULSEWIDTH: 0.4 ms
MDC IDC MSMT LEADCHNL RV SENSING INTR AMPL: 2.3 mV
MDC IDC PG SERIAL: 66339555
MDC IDC SESS DTM: 20150729184800
MDC IDC SET LEADCHNL RV PACING PULSEWIDTH: 0.4 ms

## 2014-04-08 NOTE — Patient Instructions (Signed)
Your physician recommends that you continue on your current medications as directed. Please refer to the Current Medication list given to you today.  Your physician wants you to follow-up in: September with Dr. Caryl Comes.  You will receive a reminder letter in the mail two months in advance. If you don't receive a letter, please call our office to schedule the follow-up appointment.

## 2014-04-08 NOTE — Progress Notes (Signed)
Patient Care Team: Biagio Borg, MD as PCP - General Deboraha Sprang, MD (Cardiology)   HPI  [name] is a 73 y.o. 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 .\  His pacemaker reached ERI 8/13 and he underwent generator replacement with a Biotronik device because of his CLS function  He takes dofetilide for his atrial fibrillation.  He comes in with a number of issues today. The first is anticipation of hip replacement surgery. His functional status is quite good. He underwent contralateral hip replacement a few months ago and tolerated that well.  The other issue is noted that his heart rate was very fast upon standing as well as intermittent coughing it is intermittent   Past Medical History  Diagnosis Date  . Hyperlipidemia     takes Tricor and Lipitor daily  . Coronary artery disease     LAD stenting 2004 non-DES  . Atrial fibrillation     takes Tikosyn and Eliquis daily  . Peripheral vascular disease   . Allergic rhinitis     takes Allegra daily  . Arthritis   . Bilateral popliteal artery aneurysm   . Pacemaker   . Allergic rhinitis, cause unspecified 06/22/2012  . Insomnia     takes Ambien nightly as needed  . OSA (obstructive sleep apnea)     CPAP  . Sinus infection     couple of weeks ago treated with Augmentin  . Pleurisy     early 69's  . 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 Joint pain   . Joint swelling   . Back pain     from hip pain  . Dysphagia     occasionally  . History of colon polyps   . Diverticulosis   . Urinary urgency   . Depression     took Zoloft 10yrs ago but nothing now    Past Surgical History  Procedure Laterality Date  . Inguinal hernia repair Right   . Stent (unspec)      x2  . Pacemaker insertion      due to bradycardia  . Right and left fem-pop bypass    . Hand  surgery    . Left knee surgery    . Cardiac catheterization  2004  . Hand and arm surgery Right     as a teenager  . Turbinate reduction    . Wisdom extracted     . Colonoscopy    . Total hip arthroplasty Left 12/09/2013    Procedure: TOTAL HIP ARTHROPLASTY ANTERIOR APPROACH;  Surgeon: Hessie Dibble, MD;  Location: [facility];  Service: Orthopedics;  Laterality: Left;  left anterior total hip arthroplasty    Current Outpatient Prescriptions  Medication Sig Dispense Refill  . apixaban (ELIQUIS) 5 MG TABS tablet Take 1 tablet (5 mg total) by mouth 2 (two) times daily.  60 tablet  11  . atorvastatin (LIPITOR) 80 MG tablet Take one-half tablet by  mouth daily  45 tablet  0  . CALCIUM PO Take 600 mg by mouth 2 (two) times daily.      . Cyanocobalamin (VITAMIN B 12 PO) Take 1 tablet by mouth daily.      Marland Kitchen diltiazem (DILACOR XR) 120 MG 24 hr capsule Take 1 capsule (120 mg total) by mouth daily.  90 capsule  3  .  dofetilide (TIKOSYN) 500 MCG capsule Take 1 capsule by mouth  twice daily  30 capsule  0  . fenofibrate (TRICOR) 145 MG tablet Take 1 tablet by mouth  daily  90 tablet  0  . fexofenadine (ALLEGRA) 180 MG tablet Take 180 mg by mouth daily.      Marland Kitchen FOLIC ACID PO Take 1 tablet by mouth daily.      Marland Kitchen GLUCOSAMINE CHONDROITIN COMPLX PO Take 1 tablet by mouth 2 (two) times daily.       Marland Kitchen Lysine 500 MG CAPS Take 1 capsule by mouth 3 (three) times daily.      . Magnesium 250 MG TABS Take 1 tablet by mouth 2 (two) times daily.      . methocarbamol (ROBAXIN) 500 MG tablet Take 1 tablet (500 mg total) by mouth every 6 (six) hours as needed for muscle spasms.  50 tablet  0  . Omega-3 Fatty Acids (FISH OIL) 1200 MG CAPS Take 1 capsule by mouth daily.       . potassium chloride (KLOR-CON M10) 10 MEQ tablet Take 1 tablet (10 mEq total) by mouth 2 (two) times daily.  180 tablet  1  . tadalafil (CIALIS) 20 MG tablet Take 1 tablet (20 mg total) by mouth daily as needed. For ED  30 tablet  3  . triamcinolone  (NASACORT) 55 MCG/ACT nasal inhaler Place 1 spray into the nose 2 (two) times daily.  3 Inhaler  3  . zolpidem (AMBIEN) 10 MG tablet Take 2.5-5 mg by mouth at bedtime as needed. For sleep       No current facility-administered medications for this visit.    Allergies  Allergen Reactions  . Ace Inhibitors     REACTION: cough  . Amiodarone     REACTION: peripheral neuropathy  . Mometasone Furoate Other (See Comments)    (Nasonex) Causes nasal bleeding and nose bleeds  . Niacin     REACTION: irregular heartbeat    Review of Systems negative except from HPI and PMH  Physical Exam BP 125/70  Pulse 68  Ht 6\' 3"  (1.905 m)  Wt 211 lb 12.8 oz (96.072 kg)  BMI 26.47 kg/m2 Well developed and well nourished in no acute distress HENT normal E scleral and icterus clear Neck Supple JVP flat; carotids brisk and full Clear to ausculation  *Regular rate and rhythm, no murmurs gallops or rub Soft with active bowel sounds No clubbing cyanosis non-pitting and none Edema Alert and oriented, grossly normal motor and sensory function Skin Warm and Dry  ECG demonstrates atrial paced rhythm at 91 Intervals 16/09/36  Assessment and  Plan  Atrial fibrillation-paroxysmal  Symptomatic ventricular pacing  Pacemaker Biotronik  Preoperative surgical evaluation  We spent about 45 minutes discussing in reprogrammed his device. We had decreased his CLS threshold as well as decrease his CLS maximum rate from 140--120.  We increased atrial sensitivity to try to promote more accurate mode switching and decreased associated ventricular pacing  We increased his AV hystresis rate to 150 ms which has a maximal 450 ms  From a presurgical perspective his risks should be acceptable.   He has decreased his ELIQUIS because ofBleeding associated with ejaculation

## 2014-04-12 ENCOUNTER — Other Ambulatory Visit: Payer: Self-pay | Admitting: Internal Medicine

## 2014-04-17 ENCOUNTER — Encounter (HOSPITAL_COMMUNITY): Payer: Medicare Other

## 2014-04-17 ENCOUNTER — Ambulatory Visit: Payer: Medicare Other | Admitting: Vascular Surgery

## 2014-04-17 ENCOUNTER — Other Ambulatory Visit (HOSPITAL_COMMUNITY): Payer: Medicare Other

## 2014-04-22 ENCOUNTER — Encounter (HOSPITAL_COMMUNITY): Payer: Self-pay | Admitting: Pharmacy Technician

## 2014-04-23 ENCOUNTER — Encounter: Payer: Self-pay | Admitting: Family

## 2014-04-23 ENCOUNTER — Encounter (HOSPITAL_COMMUNITY)
Admission: RE | Admit: 2014-04-23 | Discharge: 2014-04-23 | Disposition: A | Discharge status: Home / Self Care | Payer: Medicare Other | Source: Ambulatory Visit | Attending: Orthopaedic Surgery | Admitting: Orthopaedic Surgery

## 2014-04-23 ENCOUNTER — Encounter (HOSPITAL_COMMUNITY): Payer: Self-pay

## 2014-04-23 DIAGNOSIS — Z48812 Encounter for surgical aftercare following surgery on the circulatory system: Secondary | ICD-10-CM | POA: Diagnosis present

## 2014-04-23 DIAGNOSIS — I739 Peripheral vascular disease, unspecified: Secondary | ICD-10-CM | POA: Diagnosis not present

## 2014-04-23 HISTORY — DX: Personal history of other venous thrombosis and embolism: Z86.718

## 2014-04-23 LAB — TYPE AND SCREEN
ABO/RH(D): O POS
Antibody Screen: NEGATIVE

## 2014-04-23 LAB — BASIC METABOLIC PANEL
Anion gap: 11 (ref 5–15)
BUN: 15 mg/dL (ref 6–23)
CHLORIDE: 106 meq/L (ref 96–112)
CO2: 26 mEq/L (ref 19–32)
CREATININE: 1.21 mg/dL (ref 0.50–1.35)
Calcium: 10 mg/dL (ref 8.4–10.5)
GFR calc non Af Amer: 57 mL/min — ABNORMAL LOW (ref >90)
GFR, EST AFRICAN AMERICAN: 66 mL/min — AB (ref >90)
GLUCOSE: 116 mg/dL — AB (ref 70–99)
POTASSIUM: 4.8 meq/L (ref 3.7–5.3)
Sodium: 143 mEq/L (ref 137–147)

## 2014-04-23 LAB — CBC WITH DIFFERENTIAL/PLATELET
Basophils Absolute: 0 10*3/uL (ref 0.0–0.1)
Basophils Relative: 1 % (ref 0–1)
EOS ABS: 0.2 10*3/uL (ref 0.0–0.7)
Eosinophils Relative: 4 % (ref 0–5)
HCT: 43 % (ref 39.0–52.0)
HEMOGLOBIN: 14.3 g/dL (ref 13.0–17.0)
Lymphocytes Relative: 25 % (ref 12–46)
Lymphs Abs: 1.4 10*3/uL (ref 0.7–4.0)
MCH: 31.6 pg (ref 26.0–34.0)
MCHC: 33.3 g/dL (ref 30.0–36.0)
MCV: 94.9 fL (ref 78.0–100.0)
MONO ABS: 0.7 10*3/uL (ref 0.1–1.0)
MONOS PCT: 12 % (ref 3–12)
Neutro Abs: 3.2 10*3/uL (ref 1.7–7.7)
Neutrophils Relative %: 58 % (ref 43–77)
Platelets: 189 10*3/uL (ref 150–400)
RBC: 4.53 MIL/uL (ref 4.22–5.81)
RDW: 13.9 % (ref 11.5–15.5)
WBC: 5.4 10*3/uL (ref 4.0–10.5)

## 2014-04-23 LAB — URINALYSIS, ROUTINE W REFLEX MICROSCOPIC
Bilirubin Urine: NEGATIVE
GLUCOSE, UA: NEGATIVE mg/dL
Hgb urine dipstick: NEGATIVE
KETONES UR: NEGATIVE mg/dL
LEUKOCYTES UA: NEGATIVE
NITRITE: NEGATIVE
PH: 5.5 (ref 5.0–8.0)
Protein, ur: NEGATIVE mg/dL
Specific Gravity, Urine: 1.021 (ref 1.005–1.030)
Urobilinogen, UA: 0.2 mg/dL (ref 0.0–1.0)

## 2014-04-23 LAB — PROTIME-INR
INR: 1.2 (ref 0.00–1.49)
Prothrombin Time: 15.2 seconds (ref 11.6–15.2)

## 2014-04-23 LAB — SURGICAL PCR SCREEN
MRSA, PCR: NEGATIVE
STAPHYLOCOCCUS AUREUS: NEGATIVE

## 2014-04-23 LAB — APTT: aPTT: 35 seconds (ref 24–37)

## 2014-04-23 MED ORDER — CHLORHEXIDINE GLUCONATE 4 % EX LIQD: 60.0000 mL | Freq: Once | CUTANEOUS | Status: DC

## 2014-04-23 NOTE — Pre-Procedure Instructions (Addendum)
[name]  04/23/2014   Your procedure is scheduled on:  Tues, Aug 25 @ 10:10 AM  Report to [facility]  at 8:10 AM.  Call this number if you have problems the morning of surgery: [phone]   Remember:   Do not eat food or drink liquids after midnight.   Take these medicines the morning of surgery with A SIP OF WATER: Diltiazem(Dilacor),Tikosyn(Dofetilide), and Allegra(Fexofenadine)              Stop taking your Eliquis 2 days prior to surgery as instructed by Dr.Klein. Also stop taking your Fish Oil. No Goody's,BC's,Aleve,Aspirin,or any Herbal Medications   Do not wear jewelry  Do not wear lotions, powders, or colognes. You may wear deodorant.  Men may shave face and neck.  Do not bring valuables to the hospital.  [facility] is not responsible                  for any belongings or valuables.               Contacts, dentures or bridgework may not be worn into surgery.  Leave suitcase in the car. After surgery it may be brought to your room.  For patients admitted to the hospital, discharge time is determined by your                treatment team.               Special Instructions:  [facility] - Preparing for Surgery  Before surgery, you can play an important role.  Because skin is not sterile, your skin needs to be as free of germs as possible.  You can reduce the number of germs on you skin by washing with CHG (chlorahexidine gluconate) soap before surgery.  CHG is an antiseptic cleaner which kills germs and bonds with the skin to continue killing germs even after washing.  Please DO NOT use if you have an allergy to CHG or antibacterial soaps.  If your skin becomes reddened/irritated stop using the CHG and inform your nurse when you arrive at Short Stay.  Do not shave (including legs and underarms) for at least 48 hours prior to the first CHG shower.  You may shave your face.  Please follow these instructions carefully:   1.  Shower with CHG Soap the night  before surgery and the                                morning of Surgery.  2.  If you choose to wash your hair, wash your hair first as usual with your       normal shampoo.  3.  After you shampoo, rinse your hair and body thoroughly to remove the                      Shampoo.  4.  Use CHG as you would any other liquid soap.  You can apply chg directly       to the skin and wash gently with scrungie or a clean washcloth.  5.  Apply the CHG Soap to your body ONLY FROM THE NECK DOWN.        Do not use on open wounds or open sores.  Avoid contact with your eyes,       ears, mouth and genitals (private parts).  Wash genitals (private parts)  with your normal soap.  6.  Wash thoroughly, paying special attention to the area where your surgery        will be performed.  7.  Thoroughly rinse your body with warm water from the neck down.  8.  DO NOT shower/wash with your normal soap after using and rinsing off       the CHG Soap.  9.  Pat yourself dry with a clean towel.            10.  Wear clean pajamas.            11.  Place clean sheets on your bed the night of your first shower and do not        sleep with pets.  Day of Surgery  Do not apply any lotions/deoderants the morning of surgery.  Please wear clean clothes to the hospital/surgery center.     Please read over the following fact sheets that you were given: Pain Booklet, Coughing and Deep Breathing, Blood Transfusion Information, MRSA Information and Surgical Site Infection Prevention

## 2014-04-23 NOTE — Progress Notes (Addendum)
Cardiologist is Dr.Klein with last visit in epic from 04-08-14  Echo report in epic from 2007  Heart cath in epic from 2004  EKG in epic from 04-08-14  CXR in epic from 12-01-13  Medical Md is Dr.James Jenny Reichmann

## 2014-04-24 ENCOUNTER — Encounter: Payer: Self-pay | Admitting: Family

## 2014-04-24 ENCOUNTER — Ambulatory Visit (INDEPENDENT_AMBULATORY_CARE_PROVIDER_SITE_OTHER)
Admission: RE | Admit: 2014-04-24 | Discharge: 2014-04-24 | Disposition: A | Discharge status: Home / Self Care | Payer: Medicare Other | Source: Ambulatory Visit | Attending: Vascular Surgery | Admitting: Vascular Surgery

## 2014-04-24 ENCOUNTER — Ambulatory Visit (INDEPENDENT_AMBULATORY_CARE_PROVIDER_SITE_OTHER): Payer: Medicare Other | Admitting: Family

## 2014-04-24 ENCOUNTER — Ambulatory Visit (HOSPITAL_COMMUNITY)
Admission: RE | Admit: 2014-04-24 | Discharge: 2014-04-24 | Disposition: A | Discharge status: Home / Self Care | Payer: Medicare Other | Source: Ambulatory Visit | Attending: Family | Admitting: Family

## 2014-04-24 ENCOUNTER — Ambulatory Visit (INDEPENDENT_AMBULATORY_CARE_PROVIDER_SITE_OTHER)
Admission: RE | Admit: 2014-04-24 | Discharge: 2014-04-24 | Disposition: A | Discharge status: Home / Self Care | Payer: Medicare Other | Source: Ambulatory Visit | Attending: Family | Admitting: Family

## 2014-04-24 ENCOUNTER — Other Ambulatory Visit: Payer: Self-pay | Admitting: Family

## 2014-04-24 VITALS — BP 118/78 | HR 61 | Resp 16 | Ht 75.0 in | Wt 208.0 lb

## 2014-04-24 DIAGNOSIS — I739 Peripheral vascular disease, unspecified: Secondary | ICD-10-CM

## 2014-04-24 DIAGNOSIS — M25459 Effusion, unspecified hip: Secondary | ICD-10-CM

## 2014-04-24 DIAGNOSIS — I724 Aneurysm of artery of lower extremity: Secondary | ICD-10-CM

## 2014-04-24 DIAGNOSIS — Z48812 Encounter for surgical aftercare following surgery on the circulatory system: Secondary | ICD-10-CM | POA: Insufficient documentation

## 2014-04-24 DIAGNOSIS — M7989 Other specified soft tissue disorders: Secondary | ICD-10-CM

## 2014-04-24 DIAGNOSIS — M25452 Effusion, left hip: Secondary | ICD-10-CM

## 2014-04-24 NOTE — Patient Instructions (Addendum)
Peripheral Vascular Disease Peripheral Vascular Disease (PVD), also called Peripheral Arterial Disease (PAD), is a circulation problem caused by cholesterol (atherosclerotic plaque) deposits in the arteries. PVD commonly occurs in the lower extremities (legs) but it can occur in other areas of the body, such as your arms. The cholesterol buildup in the arteries reduces blood flow which can cause pain and other serious problems. The presence of PVD can place a person at risk for Coronary Artery Disease (CAD).  CAUSES  Causes of PVD can be many. It is usually associated with more than one risk factor such as:   High Cholesterol.  Smoking.  Diabetes.  Lack of exercise or inactivity.  High blood pressure (hypertension).  Obesity.  Family history. SYMPTOMS   When the lower extremities are affected, patients with PVD may experience:  Leg pain with exertion or physical activity. This is called INTERMITTENT CLAUDICATION. This may present as cramping or numbness with physical activity. The location of the pain is associated with the level of blockage. For example, blockage at the abdominal level (distal abdominal aorta) may result in buttock or hip pain. Lower leg arterial blockage may result in calf pain.  As PVD becomes more severe, pain can develop with less physical activity.  In people with severe PVD, leg pain may occur at rest.  Other PVD signs and symptoms:  Leg numbness or weakness.  Coldness in the affected leg or foot, especially when compared to the other leg.  A change in leg color.  Patients with significant PVD are more prone to ulcers or sores on toes, feet or legs. These may take longer to heal or may reoccur. The ulcers or sores can become infected.  If signs and symptoms of PVD are ignored, gangrene may occur. This can result in the loss of toes or loss of an entire limb.  Not all leg pain is related to PVD. Other medical conditions can cause leg pain such  as:  Blood clots (embolism) or Deep Vein Thrombosis.  Inflammation of the blood vessels (vasculitis).  Spinal stenosis. DIAGNOSIS  Diagnosis of PVD can involve several different types of tests. These can include:  Pulse Volume Recording Method (PVR). This test is simple, painless and does not involve the use of X-rays. PVR involves measuring and comparing the blood pressure in the arms and legs. An ABI (Ankle-Brachial Index) is calculated. The normal ratio of blood pressures is 1. As this number becomes smaller, it indicates more severe disease.  < 0.95 - indicates significant narrowing in one or more leg vessels.  <0.8 - there will usually be pain in the foot, leg or buttock with exercise.  <0.4 - will usually have pain in the legs at rest.  <0.25 - usually indicates limb threatening PVD.  Doppler detection of pulses in the legs. This test is painless and checks to see if you have a pulses in your legs/feet.  A dye or contrast material (a substance that highlights the blood vessels so they show up on x-ray) may be given to help your caregiver better see the arteries for the following tests. The dye is eliminated from your body by the kidney's. Your caregiver may order blood work to check your kidney function and other laboratory values before the following tests are performed:  Magnetic Resonance Angiography (MRA). An MRA is a picture study of the blood vessels and arteries. The MRA machine uses a large magnet to produce images of the blood vessels.  Computed Tomography Angiography (CTA). A CTA   is a specialized x-ray that looks at how the blood flows in your blood vessels. An IV may be inserted into your arm so contrast dye can be injected.  Angiogram. Is a procedure that uses x-rays to look at your blood vessels. This procedure is minimally invasive, meaning a small incision (cut) is made in your groin. A small tube (catheter) is then inserted into the artery of your groin. The catheter  is guided to the blood vessel or artery your caregiver wants to examine. Contrast dye is injected into the catheter. X-rays are then taken of the blood vessel or artery. After the images are obtained, the catheter is taken out. TREATMENT  Treatment of PVD involves many interventions which may include:  Lifestyle changes:  Quitting smoking.  Exercise.  Following a low fat, low cholesterol diet.  Control of diabetes.  Foot care is very important to the PVD patient. Good foot care can help prevent infection.  Medication:  Cholesterol-lowering medicine.  Blood pressure medicine.  Anti-platelet drugs.  Certain medicines may reduce symptoms of Intermittent Claudication.  Interventional/Surgical options:  Angioplasty. An Angioplasty is a procedure that inflates a balloon in the blocked artery. This opens the blocked artery to improve blood flow.  Stent Implant. A wire mesh tube (stent) is placed in the artery. The stent expands and stays in place, allowing the artery to remain open.  Peripheral Bypass Surgery. This is a surgical procedure that reroutes the blood around a blocked artery to help improve blood flow. This type of procedure may be performed if Angioplasty or stent implants are not an option. SEEK IMMEDIATE MEDICAL CARE IF:   You develop pain or numbness in your arms or legs.  Your arm or leg turns cold, becomes blue in color.  You develop redness, warmth, swelling and pain in your arms or legs. MAKE SURE YOU:   Understand these instructions.  Will watch your condition.  Will get help right away if you are not doing well or get worse. Document Released: 10/05/2004 Document Revised: 11/20/2011 Document Reviewed: 09/01/2008 ExitCare Patient Information 2015 [employer]. This information is not intended to replace advice given to you by your health care provider. Make sure you discuss any questions you have with your health care provider.   Venous Stasis or  Chronic Venous Insufficiency Chronic venous insufficiency, also called venous stasis, is a condition that affects the veins in the legs. The condition prevents blood from being pumped through these veins effectively. Blood may no longer be pumped effectively from the legs back to the heart. This condition can range from mild to severe. With proper treatment, you should be able to continue with an active life. CAUSES  Chronic venous insufficiency occurs when the vein walls become stretched, weakened, or damaged or when valves within the vein are damaged. Some common causes of this include:  High blood pressure inside the veins (venous hypertension).  Increased blood pressure in the leg veins from long periods of sitting or standing.  A blood clot that blocks blood flow in a vein (deep vein thrombosis).  Inflammation of a superficial vein (phlebitis) that causes a blood clot to form. RISK FACTORS Various things can make you more likely to develop chronic venous insufficiency, including:  Family history of this condition.  Obesity.  Pregnancy.  Sedentary lifestyle.  Smoking.  Jobs requiring long periods of standing or sitting in one place.  Being a certain age. Women in their 40s and 50s and men in their 70s are more   likely to develop this condition. SIGNS AND SYMPTOMS  Symptoms may include:   Varicose veins.  Skin breakdown or ulcers.  Reddened or discolored skin on the leg.  Brown, smooth, tight, and painful skin just above the ankle, usually on the inside surface (lipodermatosclerosis).  Swelling. DIAGNOSIS  To diagnose this condition, your health care provider will take a medical history and do a physical exam. The following tests may be ordered to confirm the diagnosis:  Duplex ultrasound--A procedure that produces a picture of a blood vessel and nearby organs and also provides information on blood flow through the blood vessel.  Plethysmography--A procedure that tests  blood flow.  A venogram, or venography--A procedure used to look at the veins using X-ray and dye. TREATMENT The goals of treatment are to help you return to an active life and to minimize pain or disability. Treatment will depend on the severity of the condition. Medical procedures may be needed for severe cases. Treatment options may include:   Use of compression stockings. These can help with symptoms and lower the chances of the problem getting worse, but they do not cure the problem.  Sclerotherapy--A procedure involving an injection of a material that "dissolves" the damaged veins. Other veins in the network of blood vessels take over the function of the damaged veins.  Surgery to remove the vein or cut off blood flow through the vein (vein stripping or laser ablation surgery).  Surgery to repair a valve. HOME CARE INSTRUCTIONS   Wear compression stockings as directed by your health care provider.  Only take over-the-counter or prescription medicines for pain, discomfort, or fever as directed by your health care provider.  Follow up with your health care provider as directed. SEEK MEDICAL CARE IF:   You have redness, swelling, or increasing pain in the affected area.  You see a red streak or line that extends up or down from the affected area.  You have a breakdown or loss of skin in the affected area, even if the breakdown is small.  You have an injury to the affected area. SEEK IMMEDIATE MEDICAL CARE IF:   You have an injury and open wound in the affected area.  Your pain is severe and does not improve with medicine.  You have sudden numbness or weakness in the foot or ankle below the affected area, or you have trouble moving your foot or ankle.  You have a fever or persistent symptoms for more than 2-3 days.  You have a fever and your symptoms suddenly get worse. MAKE SURE YOU:   Understand these instructions.  Will watch your condition.  Will get help right  away if you are not doing well or get worse. Document Released: 01/01/2007 Document Revised: 06/18/2013 Document Reviewed: 05/05/2013 Exit[employer] Patient Information 2015 [employer]. This information is not intended to replace advice given to you by your health care provider. Make sure you discuss any questions you have with your health care provider.  

## 2014-04-24 NOTE — Progress Notes (Signed)
[facility] [place] HISTORY AND PHYSICAL -PAD  History of Present Illness [name] is a 73 y.o. male patient of Dr. Kellie Simmering, then Dr. Bridgett Larsson, who is s/p right femoropopliteal arterial bypass graft 08/17/2002 and left femoropopliteal arterial bypass graft on 02/19/2002. He returns today for routine follow up. He denies claudication symptoms in his legs with walking, denies non healing wounds. He saw a dermatologist for his light lower leg rash, who treated him with a topical corticosteroid which resolved the rash. Today the left lower leg is swollen and slightly red. He had left hip replacement December 09, 2013 at [facility], is scheduled to have right hip replaced May 05, 2014. Swelling started in left leg immediately after left hip replacement, mostly subsided, reappeared about a month ago.The swelling in the left leg is less in the morning but the redness remains. He has a pacemaker, history of atrial fib for which he also takes Eliquis. His father had a dissecting AAA, he had a sleeve surgical repair, he was a heavy smoker, did not have DM, was obese, per pt. Pt had a normal AAA Duplex in 2013. Pt denies back or abdominal pain. Pt denies any history of stroke or TIA. Pt c/o left hand as more cold than right intermittently, denies tingling, numbness, or pain. He states that he may have a picnched nerve in his c-spine.  Pt Diabetic: No Pt smoker: non-smoker  Pt meds include: Statin :Yes ASA: Yes Other anticoagulants/antiplatelets: Eliquis  Past Medical History  Diagnosis Date  . Hyperlipidemia     takes Tricor and Lipitor daily  . Coronary artery disease     LAD stenting 2004 non-DES  . Atrial fibrillation     takes Tikosyn and Eliquis daily  . Peripheral vascular disease   . Allergic rhinitis     takes Allegra daily  . Arthritis   . Bilateral popliteal artery aneurysm   . Pacemaker   . Allergic rhinitis, cause unspecified 06/22/2012  .  Insomnia     takes Ambien nightly as needed  . OSA (obstructive sleep apnea)     CPAP  . Pleurisy     early 27's  . 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 Joint pain   . Joint swelling   . Dysphagia     occasionally  . History of colon polyps   . Diverticulosis   . Urinary urgency   . Depression     took Zoloft 29yrs ago but nothing now  . History of blood clots 2003    left leg prior to fem pop    Social History History  Substance Use Topics  . Smoking status: Never Smoker   . Smokeless tobacco: Never Used  . Alcohol Use: No     Comment: nothing since 2000    Family History Family History  Problem Relation Age of Onset  . Diabetes Other     family hx  . Sleep apnea Other     family hx  . Diabetes Mother   . Heart disease Mother   . Heart disease Father   . Heart attack Father   . Stroke Father   . Aneurysm Father     bilat pop art aneurysms    Past Surgical History  Procedure Laterality Date  . Inguinal hernia repair Right   . Stent (unspec)      x2  . Pacemaker insertion      due to  bradycardia  . Right and left fem-pop bypass    . Hand surgery Left   . Left knee surgery    . Cardiac catheterization  2004  . Hand and arm surgery Right     as a teenager  . Turbinate reduction    . Wisdom extracted     . Colonoscopy    . Total hip arthroplasty Left 12/09/2013    Procedure: TOTAL HIP ARTHROPLASTY ANTERIOR APPROACH;  Surgeon: Hessie Dibble, MD;  Location: [facility];  Service: Orthopedics;  Laterality: Left;  left anterior total hip arthroplasty    Allergies  Allergen Reactions  . Ace Inhibitors     REACTION: cough  . Amiodarone     REACTION: peripheral neuropathy  . Mometasone Furoate Other (See Comments)    (Nasonex) Causes nasal bleeding and nose bleeds  . Niacin     REACTION: irregular heartbeat    Current Outpatient Prescriptions  Medication Sig Dispense Refill  . apixaban (ELIQUIS) 2.5 MG TABS tablet Take 2.5 mg by  mouth 2 (two) times daily.      Marland Kitchen atorvastatin (LIPITOR) 80 MG tablet Take 40 mg by mouth daily.      Marland Kitchen CALCIUM PO Take 600 mg by mouth 2 (two) times daily.      . Cyanocobalamin (VITAMIN B 12 PO) Take 1 tablet by mouth daily.      Marland Kitchen diltiazem (DILACOR XR) 120 MG 24 hr capsule Take 1 capsule (120 mg total) by mouth daily.  90 capsule  3  . dofetilide (TIKOSYN) 500 MCG capsule Take 500 mcg by mouth 2 (two) times daily.      . fenofibrate (TRICOR) 145 MG tablet Take 145 mg by mouth daily.      . fexofenadine (ALLEGRA) 180 MG tablet Take 180 mg by mouth daily.      Marland Kitchen FOLIC ACID PO Take 1 tablet by mouth daily.      Marland Kitchen GLUCOSAMINE CHONDROITIN COMPLX PO Take 1 tablet by mouth 2 (two) times daily.       Marland Kitchen Lysine 500 MG TABS Take 500 mg by mouth 3 (three) times daily.      . Magnesium 250 MG TABS Take 250 tablets by mouth 2 (two) times daily.       . Omega-3 Fatty Acids (FISH OIL) 1200 MG CAPS Take 1,200 mg by mouth daily.       . potassium chloride (KLOR-CON M10) 10 MEQ tablet Take 1 tablet (10 mEq total) by mouth 2 (two) times daily.  180 tablet  1  . tadalafil (CIALIS) 20 MG tablet Take 1 tablet (20 mg total) by mouth daily as needed. For ED  30 tablet  3  . triamcinolone (NASACORT) 55 MCG/ACT AERO nasal inhaler Place 1 spray into both nostrils daily.      Marland Kitchen zolpidem (AMBIEN) 10 MG tablet Take 2.5-5 mg by mouth at bedtime as needed for sleep.        No current facility-administered medications for this visit.    ROS: See HPI for pertinent positives and negatives.   Physical Examination  Filed Vitals:   04/24/14 1606  BP: 118/78  Pulse: 61  Resp: 16  Height: 6\' 3"  (1.905 m)  Weight: 208 lb (94.348 kg)  SpO2: 100%   Body mass index is 26 kg/(m^2).  General: A&O x 3, WDWN. Gait: normal Eyes: PERRLA. Pulmonary: CTAB, without wheezes , rales or rhonchi. Cardiac: regular Rythm , without detected murmur, pacemaker palpated left upper chest subcutaneously.  Carotid Bruits Right  Left   Negative Negative  Aorta is palpable. Radial pulses: are 2+ palpable and =                           VASCULAR EXAM: Extremities without ischemic changes  without Gangrene; without open wounds. Mild erythema at mid anterior left tibial area, non tender, 1+ pitting, 2+ non pitting edema in left lower leg only                                                                                                          LE Pulses Right Left       FEMORAL   palpable   palpable        POPLITEAL  not palpable   not palpable       POSTERIOR TIBIAL  2+ palpable   2+ palpable        DORSALIS PEDIS      ANTERIOR TIBIAL 1+ palpable  faintly palpable    Abdomen: soft, NT, no masses. Skin: see extremities, no ulcers noted. Musculoskeletal: no muscle wasting or atrophy.  Neurologic: A&O X 3; Appropriate Affect ; SENSATION: normal; MOTOR FUNCTION:  moving all extremities equally, motor strength 5/5 throughout. Speech is fluent/normal. CN 2-12 intact.    Non-Invasive Vascular Imaging: DATE: 04/24/2014 LOWER EXTREMITY ARTERIAL DUPLEX EVALUATION    INDICATION: Follow-up bilateral lower extremity bypass graft for popliteal artery aneurysm    PREVIOUS INTERVENTION(S): Right femoropopliteal arterial bypass graft 08/17/2002 Left femoropopliteal arterial bypass graft 02/19/2002    DUPLEX EXAM:     RIGHT  LEFT   Peak Systolic Velocity (cm/s) Ratio (if abnormal) Waveform  Peak Systolic Velocity (cm/s) Ratio (if abnormal) Waveform  80  T Inflow Artery 85  T  42  T Proximal Anastomosis 66  T  69  T Proximal Graft 74  T  69  T Mid Graft 115  T  78  T  Distal Graft 89  T  81  T Distal Anastomosis 66  T  92  T Outflow Artery 69  T  Unreliable Today's ABI / TBI Unreliable  1.30 Previous ABI / TBI (04/11/2013  ) 1.27    Waveform:    M - Monophasic       B - Biphasic       T - Triphasic  If Ankle Brachial Index (ABI) or Toe Brachial Index (TBI) performed, please see complete report     ADDITIONAL  FINDINGS:     IMPRESSION: Widely patent bilateral lower extremity bypass graft without evidence of restenosis or hyperplasia.     Compared to the previous exam:  No significant change compared to prior exam.      ASSESSMENT: [name] is a 73 y.o. male who is s/p right femoropopliteal arterial bypass graft 08/17/2002 and left femoropopliteal arterial bypass graft 02/19/2002. He has no claudication symptoms with walking, no non healing wounds. Bilateral lower extremity arterial Duplex today demonstrates a widely patent bilateral lower extremity bypass graft without evidence of  restenosis or hyperplasia.  ABI's are unreliable, however there are triphasic waveforms throughout.   Both TBI's are WNL.  He has left lower leg edema and erythema, is 5 months s/p left hip replacement; no record of venous duplex on file.  Left lower extremity Venous Duplex today is negative for DVT. His left leg has less swelling in the morning. Since no DVT is detected on venous Duplex, and he has less LLE swelling in the morning, he likely has mild to moderate venous insufficiency. His left saphenous vein was harvested in 2003 for the bypass graft, making venous insufficiency more likely. The erythema in his left lower leg seems to be the residual of a rash that has been treated by his dermatologist with a topical corticosteroid.   PLAN:  I discussed in depth with the patient the nature of atherosclerosis, and emphasized the importance of maximal medical management including strict control of blood pressure, blood glucose, and lipid levels, obtaining regular exercise, and continued cessation of smoking.  The patient is aware that without maximal medical management the underlying atherosclerotic disease process will progress, limiting the benefit of any interventions.  Based on the patient's vascular studies and examination, pt will return to clinic in 1 year for ABI's and bilateral LE arterial Duplex.   The  patient was given information about PAD including signs, symptoms, treatment, what symptoms should prompt the patient to seek immediate medical care, and risk reduction measures to take.  Clemon Chambers, RN, MSN, FNP-C [facility] Phone: [phone]  Clinic MD: Bridgett Larsson on call  04/24/2014 3:56 PM

## 2014-04-30 NOTE — H&P (Signed)
TOTAL HIP ADMISSION H&P  Patient is admitted for right total hip arthroplasty.  Subjective:  Chief Complaint: right hip pain  HPI: [name], 73 y.o. male, has a history of pain and functional disability in the right hip(s) due to arthritis and patient has failed non-surgical conservative treatments for greater than 12 weeks to include NSAID's and/or analgesics, flexibility and strengthening excercises, use of assistive devices, weight reduction as appropriate and activity modification.  Onset of symptoms was gradual starting 5 years ago with gradually worsening course since that time.The patient noted no past surgery on the right hip(s).  Patient currently rates pain in the right hip at 10 out of 10 with activity. Patient has night pain, worsening of pain with activity and weight bearing, pain that interfers with activities of daily living and crepitus. Patient has evidence of subchondral cysts, subchondral sclerosis, periarticular osteophytes and joint space narrowing by imaging studies. This condition presents safety issues increasing the risk of falls.  There is no current active infection.  Patient Active Problem List   Diagnosis Date Noted  . Aftercare following surgery of the circulatory system, [place] 04/24/2014  . Degenerative joint disease (DJD) of hip 12/09/2013  . 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 Osteoarthritis of left hip 11/05/2013  . Chronic left sacroiliac joint pain 10/13/2013  . Discoloration of skin of lower leg-Left 04/11/2013  . Cellulitis, toe 09/12/2012  . Allergic rhinitis, cause unspecified 06/22/2012  . Preventative health care 06/21/2012  . Chronic sinusitis 06/21/2012  . Aneurysm artery, popliteal 01/30/2012  . Erectile dysfunction 02/21/2011  . 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 CERVICAL RADICULOPATHY, LEFT 05/31/2010  . Atrial fibrillation 05/11/2009  . 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 LATERAL EPICONDYLITIS, RIGHT 02/02/2009  . HYPERLIPIDEMIA  01/07/2008  . DEPRESSION 01/07/2008  . OBSTRUCTIVE SLEEP APNEA 01/07/2008  . PERIPHERAL VASCULAR DISEASE 01/07/2008  . DIVERTICULOSIS, COLON 01/07/2008   Past Medical History  Diagnosis Date  . Hyperlipidemia     takes Tricor and Lipitor daily  . Coronary artery disease     LAD stenting 2004 non-DES  . Atrial fibrillation     takes Tikosyn and Eliquis daily  . Peripheral vascular disease   . Allergic rhinitis     takes Allegra daily  . Arthritis   . Bilateral popliteal artery aneurysm   . Pacemaker   . Allergic rhinitis, cause unspecified 06/22/2012  . Insomnia     takes Ambien nightly as needed  . OSA (obstructive sleep apnea)     CPAP  . Pleurisy     early 104's  . 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 Joint pain   . Joint swelling   . Dysphagia     occasionally  . History of colon polyps   . Diverticulosis   . Urinary urgency   . Depression     took Zoloft 67yrs ago but nothing now  . History of blood clots 2003    left leg prior to fem pop  . DVT (deep venous thrombosis)     Past Surgical History  Procedure Laterality Date  . Inguinal hernia repair Right   . Stent (unspec)      x2  . Pacemaker insertion      due to bradycardia  . Right and left fem-pop bypass    . Hand surgery Left   . Left knee surgery    . Cardiac catheterization  2004  . Hand and arm surgery Right  as a teenager  . Turbinate reduction    . Wisdom extracted     . Colonoscopy    . Total hip arthroplasty Left 12/09/2013    Procedure: TOTAL HIP ARTHROPLASTY ANTERIOR APPROACH;  Surgeon: Hessie Dibble, MD;  Location: [facility];  Service: Orthopedics;  Laterality: Left;  left anterior total hip arthroplasty  . Joint replacement Left December 09, 2013    Left Hip   . Joint replacement Right Aug. 25, 2015    Right Hip    No prescriptions prior to admission   Allergies  Allergen Reactions  . Ace Inhibitors     REACTION: cough  . Amiodarone     REACTION: peripheral  neuropathy  . Mometasone Furoate Other (See Comments)    (Nasonex) Causes nasal bleeding and nose bleeds  . Niacin     REACTION: irregular heartbeat    History  Substance Use Topics  . Smoking status: Never Smoker   . Smokeless tobacco: Never Used  . Alcohol Use: No     Comment: nothing since 2000    Family History  Problem Relation Age of Onset  . Diabetes Other     family hx  . Sleep apnea Other     family hx  . Diabetes Mother   . Heart disease Mother     Before age 15  . Heart disease Father     Before age 72  . Heart attack Father   . Stroke Father   . Aneurysm Father     bilat pop art aneurysms  . Peripheral vascular disease Father     Popliteal Aneurysm     Review of Systems  Musculoskeletal: Positive for joint pain.       Right hip  All other systems reviewed and are negative.   Objective:  Physical Exam  Constitutional: He is oriented to person, place, and time. He appears well-developed and well-nourished.  HENT:  Head: Normocephalic and atraumatic.  Eyes: Conjunctivae and EOM are normal. Pupils are equal, round, and reactive to light.  Neck: Normal range of motion.  Cardiovascular: Normal rate.   Respiratory: Effort normal.  GI: Soft.  Musculoskeletal:  Reege walks with a slightly altered gait favoring the right hip.  The incision on the left is healed up.  He has limited rotation on the right with pain on full internal rotation.  His leg lengths are roughly equal.  Sensation and motor function are intact in his feet with palpable pulses on both sides.  There is no palpable lymphadenopathy behind either knee or at the groin.   Neurological: He is alert and oriented to person, place, and time.  Skin: Skin is warm and dry.  Psychiatric: He has a normal mood and affect. His behavior is normal. Judgment and thought content normal.    Vital signs in last 24 hours:    Labs:   Estimated body mass index is 25.35 kg/(m^2) as calculated from the  following:   Height as of 12/10/13: 6\' 3"  (1.905 m).   Weight as of 12/01/13: 91.989 kg (202 lb 12.8 oz).   Imaging Review Plain radiographs demonstrate severe degenerative joint disease of the right hip(s). The bone quality appears to be good for age and reported activity level.  Assessment/Plan:  End stage arthritis, right hip(s)  The patient history, physical examination, clinical judgement of the provider and imaging studies are consistent with end stage degenerative joint disease of the right hip(s) and total hip arthroplasty is deemed medically necessary. The  treatment options including medical management, injection therapy, arthroscopy and arthroplasty were discussed at length. The risks and benefits of total hip arthroplasty were presented and reviewed. The risks due to aseptic loosening, infection, stiffness, dislocation/subluxation,  thromboembolic complications and other imponderables were discussed.  The patient acknowledged the explanation, agreed to proceed with the plan and consent was signed. Patient is being admitted for inpatient treatment for surgery, pain control, PT, OT, prophylactic antibiotics, VTE prophylaxis, progressive ambulation and ADL's and discharge planning.The patient is planning to be discharged home with home health services

## 2014-05-04 MED ORDER — CEFAZOLIN SODIUM-DEXTROSE 2-3 GM-% IV SOLR
2.0000 g | INTRAVENOUS | Status: AC
  Administered 2014-05-05: 2 g via INTRAVENOUS
  Filled 2014-05-04: qty 50

## 2014-05-05 ENCOUNTER — Inpatient Hospital Stay (HOSPITAL_COMMUNITY): Payer: Medicare Other

## 2014-05-05 ENCOUNTER — Inpatient Hospital Stay (HOSPITAL_COMMUNITY)
Admission: RE | Admit: 2014-05-05 | Discharge: 2014-05-06 | DRG: 470 | Disposition: A | Discharge status: Home Health | Payer: Medicare Other | Source: Ambulatory Visit | Attending: Orthopaedic Surgery | Admitting: Orthopaedic Surgery

## 2014-05-05 ENCOUNTER — Encounter (HOSPITAL_COMMUNITY): Payer: Self-pay | Admitting: Certified Registered Nurse Anesthetist

## 2014-05-05 ENCOUNTER — Inpatient Hospital Stay (HOSPITAL_COMMUNITY): Payer: Medicare Other | Admitting: Certified Registered Nurse Anesthetist

## 2014-05-05 ENCOUNTER — Encounter (HOSPITAL_COMMUNITY)
Admission: RE | Disposition: A | Discharge status: Home Health | Payer: Self-pay | Source: Ambulatory Visit | Attending: Orthopaedic Surgery

## 2014-05-05 ENCOUNTER — Encounter (HOSPITAL_COMMUNITY): Payer: Medicare Other | Admitting: Certified Registered Nurse Anesthetist

## 2014-05-05 DIAGNOSIS — M1611 Unilateral primary osteoarthritis, right hip: Secondary | ICD-10-CM

## 2014-05-05 DIAGNOSIS — Z96649 Presence of unspecified artificial hip joint: Secondary | ICD-10-CM | POA: Diagnosis not present

## 2014-05-05 DIAGNOSIS — E785 Hyperlipidemia, unspecified: Secondary | ICD-10-CM | POA: Diagnosis present

## 2014-05-05 DIAGNOSIS — I739 Peripheral vascular disease, unspecified: Secondary | ICD-10-CM | POA: Diagnosis present

## 2014-05-05 DIAGNOSIS — M1610 Unilateral primary osteoarthritis, unspecified hip: Secondary | ICD-10-CM | POA: Diagnosis present

## 2014-05-05 DIAGNOSIS — G4733 Obstructive sleep apnea (adult) (pediatric): Secondary | ICD-10-CM | POA: Diagnosis present

## 2014-05-05 DIAGNOSIS — Z7901 Long term (current) use of anticoagulants: Secondary | ICD-10-CM

## 2014-05-05 DIAGNOSIS — I4891 Unspecified atrial fibrillation: Secondary | ICD-10-CM | POA: Diagnosis present

## 2014-05-05 DIAGNOSIS — Z86718 Personal history of other venous thrombosis and embolism: Secondary | ICD-10-CM | POA: Diagnosis not present

## 2014-05-05 DIAGNOSIS — M169 Osteoarthritis of hip, unspecified: Secondary | ICD-10-CM | POA: Diagnosis present

## 2014-05-05 DIAGNOSIS — Z9861 Coronary angioplasty status: Secondary | ICD-10-CM | POA: Diagnosis not present

## 2014-05-05 DIAGNOSIS — F329 Major depressive disorder, single episode, unspecified: Secondary | ICD-10-CM | POA: Diagnosis present

## 2014-05-05 DIAGNOSIS — Z950 Presence of cardiac pacemaker: Secondary | ICD-10-CM

## 2014-05-05 DIAGNOSIS — F3289 Other specified depressive episodes: Secondary | ICD-10-CM | POA: Diagnosis present

## 2014-05-05 DIAGNOSIS — I2510 Atherosclerotic heart disease of native coronary artery without angina pectoris: Secondary | ICD-10-CM | POA: Diagnosis present

## 2014-05-05 DIAGNOSIS — G4700 Insomnia, unspecified: Secondary | ICD-10-CM | POA: Diagnosis present

## 2014-05-05 DIAGNOSIS — M25559 Pain in unspecified hip: Secondary | ICD-10-CM | POA: Diagnosis present

## 2014-05-05 DIAGNOSIS — Z79899 Other long term (current) drug therapy: Secondary | ICD-10-CM

## 2014-05-05 HISTORY — PX: JOINT REPLACEMENT: SHX530

## 2014-05-05 HISTORY — PX: TOTAL HIP ARTHROPLASTY: SHX124

## 2014-05-05 SURGERY — ARTHROPLASTY, HIP, TOTAL, ANTERIOR APPROACH
Anesthesia: General | Laterality: Right

## 2014-05-05 MED ORDER — ONDANSETRON HCL 4 MG PO TABS
4.0000 mg | ORAL_TABLET | Freq: Four times a day (QID) | ORAL | Status: DC | PRN
Start: 2014-05-05 — End: 2014-05-06
  Administered 2014-05-06: 4 mg via ORAL
  Filled 2014-05-05 (×2): qty 1

## 2014-05-05 MED ORDER — ZOLPIDEM TARTRATE 5 MG PO TABS: 2.5000 mg | ORAL_TABLET | Freq: Every evening | ORAL | Status: DC | PRN

## 2014-05-05 MED ORDER — DIPHENHYDRAMINE HCL 12.5 MG/5ML PO ELIX: 12.5000 mg | ORAL_SOLUTION | ORAL | Status: DC | PRN

## 2014-05-05 MED ORDER — LACTATED RINGERS IV SOLN
INTRAVENOUS | Status: DC
  Administered 2014-05-05: 09:00:00 via INTRAVENOUS

## 2014-05-05 MED ORDER — LACTATED RINGERS IV SOLN
INTRAVENOUS | Status: DC
  Administered 2014-05-05 (×2): via INTRAVENOUS

## 2014-05-05 MED ORDER — METHOCARBAMOL 1000 MG/10ML IJ SOLN
500.0000 mg | Freq: Four times a day (QID) | INTRAVENOUS | Status: DC | PRN
  Filled 2014-05-05: qty 5

## 2014-05-05 MED ORDER — FLUTICASONE PROPIONATE 50 MCG/ACT NA SUSP
2.0000 | Freq: Every day | NASAL | Status: DC
  Administered 2014-05-05 – 2014-05-06 (×2): 2 via NASAL
  Filled 2014-05-05: qty 16

## 2014-05-05 MED ORDER — BISACODYL 5 MG PO TBEC: 5.0000 mg | DELAYED_RELEASE_TABLET | Freq: Every day | ORAL | Status: DC | PRN

## 2014-05-05 MED ORDER — METOCLOPRAMIDE HCL 10 MG PO TABS: 5.0000 mg | ORAL_TABLET | Freq: Three times a day (TID) | ORAL | Status: DC | PRN

## 2014-05-05 MED ORDER — ACETAMINOPHEN 650 MG RE SUPP: 650.0000 mg | Freq: Four times a day (QID) | RECTAL | Status: DC | PRN

## 2014-05-05 MED ORDER — DEXAMETHASONE SODIUM PHOSPHATE 4 MG/ML IJ SOLN
INTRAMUSCULAR | Status: DC | PRN
  Administered 2014-05-05: 4 mg via INTRAVENOUS

## 2014-05-05 MED ORDER — TRIAMCINOLONE ACETONIDE 55 MCG/ACT NA AERO
2.0000 | INHALATION_SPRAY | Freq: Every day | NASAL | Status: DC
  Filled 2014-05-05: qty 21.6

## 2014-05-05 MED ORDER — HYDROMORPHONE HCL PF 1 MG/ML IJ SOLN
INTRAMUSCULAR | Status: AC
  Filled 2014-05-05: qty 1

## 2014-05-05 MED ORDER — HYDROMORPHONE HCL PF 1 MG/ML IJ SOLN
0.2500 mg | INTRAMUSCULAR | Status: DC | PRN
  Administered 2014-05-05 (×4): 0.5 mg via INTRAVENOUS

## 2014-05-05 MED ORDER — FENOFIBRATE 160 MG PO TABS
160.0000 mg | ORAL_TABLET | Freq: Every day | ORAL | Status: DC
  Administered 2014-05-05 – 2014-05-06 (×2): 160 mg via ORAL
  Filled 2014-05-05 (×2): qty 1

## 2014-05-05 MED ORDER — ATORVASTATIN CALCIUM 40 MG PO TABS
40.0000 mg | ORAL_TABLET | Freq: Every day | ORAL | Status: DC
  Administered 2014-05-05 – 2014-05-06 (×2): 40 mg via ORAL
  Filled 2014-05-05 (×2): qty 1

## 2014-05-05 MED ORDER — METHOCARBAMOL 500 MG PO TABS
500.0000 mg | ORAL_TABLET | Freq: Four times a day (QID) | ORAL | Status: DC | PRN
  Administered 2014-05-05 – 2014-05-06 (×2): 500 mg via ORAL
  Filled 2014-05-05 (×3): qty 1

## 2014-05-05 MED ORDER — ACETAMINOPHEN 325 MG PO TABS: 650.0000 mg | ORAL_TABLET | Freq: Four times a day (QID) | ORAL | Status: DC | PRN

## 2014-05-05 MED ORDER — MAGNESIUM 250 MG PO TABS: 250.0000 | ORAL_TABLET | Freq: Two times a day (BID) | ORAL | Status: DC

## 2014-05-05 MED ORDER — ALUM & MAG HYDROXIDE-SIMETH 200-200-20 MG/5ML PO SUSP: 30.0000 mL | ORAL | Status: DC | PRN

## 2014-05-05 MED ORDER — 0.9 % SODIUM CHLORIDE (POUR BTL) OPTIME
TOPICAL | Status: DC | PRN
  Administered 2014-05-05: 1000 mL

## 2014-05-05 MED ORDER — FENTANYL CITRATE 0.05 MG/ML IJ SOLN
INTRAMUSCULAR | Status: AC
  Filled 2014-05-05: qty 5

## 2014-05-05 MED ORDER — MAGNESIUM OXIDE 400 (241.3 MG) MG PO TABS
200.0000 mg | ORAL_TABLET | Freq: Two times a day (BID) | ORAL | Status: DC
  Administered 2014-05-06 (×2): 200 mg via ORAL
  Filled 2014-05-05 (×3): qty 0.5

## 2014-05-05 MED ORDER — MIDAZOLAM HCL 5 MG/5ML IJ SOLN
INTRAMUSCULAR | Status: DC | PRN
  Administered 2014-05-05: 2 mg via INTRAVENOUS

## 2014-05-05 MED ORDER — FENTANYL CITRATE 0.05 MG/ML IJ SOLN
INTRAMUSCULAR | Status: DC | PRN
  Administered 2014-05-05 (×3): 50 ug via INTRAVENOUS
  Administered 2014-05-05: 100 ug via INTRAVENOUS

## 2014-05-05 MED ORDER — DOCUSATE SODIUM 100 MG PO CAPS
100.0000 mg | ORAL_CAPSULE | Freq: Two times a day (BID) | ORAL | Status: DC
  Administered 2014-05-05 – 2014-05-06 (×2): 100 mg via ORAL
  Filled 2014-05-05 (×3): qty 1

## 2014-05-05 MED ORDER — POTASSIUM CHLORIDE CRYS ER 10 MEQ PO TBCR
10.0000 meq | EXTENDED_RELEASE_TABLET | Freq: Two times a day (BID) | ORAL | Status: DC
  Administered 2014-05-05 – 2014-05-06 (×2): 10 meq via ORAL
  Filled 2014-05-05 (×3): qty 1

## 2014-05-05 MED ORDER — ONDANSETRON HCL 4 MG/2ML IJ SOLN
INTRAMUSCULAR | Status: AC
  Filled 2014-05-05: qty 2

## 2014-05-05 MED ORDER — ROCURONIUM BROMIDE 50 MG/5ML IV SOLN
INTRAVENOUS | Status: AC
  Filled 2014-05-05: qty 1

## 2014-05-05 MED ORDER — NEOSTIGMINE METHYLSULFATE 10 MG/10ML IV SOLN
INTRAVENOUS | Status: DC | PRN
  Administered 2014-05-05: 3 mg via INTRAVENOUS

## 2014-05-05 MED ORDER — HYDROMORPHONE HCL PF 1 MG/ML IJ SOLN
0.5000 mg | INTRAMUSCULAR | Status: DC | PRN
  Administered 2014-05-05 (×2): 0.5 mg via INTRAVENOUS

## 2014-05-05 MED ORDER — LIDOCAINE HCL (CARDIAC) 20 MG/ML IV SOLN
INTRAVENOUS | Status: DC | PRN
  Administered 2014-05-05: 60 mg via INTRAVENOUS

## 2014-05-05 MED ORDER — MIDAZOLAM HCL 2 MG/2ML IJ SOLN
INTRAMUSCULAR | Status: AC
Start: 2014-05-05 — End: 2014-05-05
  Filled 2014-05-05: qty 2

## 2014-05-05 MED ORDER — LACTATED RINGERS IV SOLN
INTRAVENOUS | Status: DC | PRN
  Administered 2014-05-05 (×2): via INTRAVENOUS

## 2014-05-05 MED ORDER — APIXABAN 2.5 MG PO TABS
2.5000 mg | ORAL_TABLET | Freq: Two times a day (BID) | ORAL | Status: DC
  Administered 2014-05-05 – 2014-05-06 (×2): 2.5 mg via ORAL
  Filled 2014-05-05 (×3): qty 1

## 2014-05-05 MED ORDER — ONDANSETRON HCL 4 MG/2ML IJ SOLN
INTRAMUSCULAR | Status: DC | PRN
  Administered 2014-05-05: 4 mg via INTRAVENOUS

## 2014-05-05 MED ORDER — NEOSTIGMINE METHYLSULFATE 10 MG/10ML IV SOLN
INTRAVENOUS | Status: AC
  Filled 2014-05-05: qty 1

## 2014-05-05 MED ORDER — ONDANSETRON HCL 4 MG/2ML IJ SOLN
4.0000 mg | Freq: Four times a day (QID) | INTRAMUSCULAR | Status: DC | PRN
Start: 2014-05-05 — End: 2014-05-06
  Administered 2014-05-05 – 2014-05-06 (×3): 4 mg via INTRAVENOUS
  Filled 2014-05-05 (×3): qty 2

## 2014-05-05 MED ORDER — PROPOFOL 10 MG/ML IV BOLUS
INTRAVENOUS | Status: AC
  Filled 2014-05-05: qty 20

## 2014-05-05 MED ORDER — PROPOFOL 10 MG/ML IV BOLUS
INTRAVENOUS | Status: DC | PRN
  Administered 2014-05-05: 180 mg via INTRAVENOUS

## 2014-05-05 MED ORDER — PHENOL 1.4 % MT LIQD: 1.0000 | OROMUCOSAL | Status: DC | PRN

## 2014-05-05 MED ORDER — METOCLOPRAMIDE HCL 5 MG/ML IJ SOLN: 5.0000 mg | Freq: Three times a day (TID) | INTRAMUSCULAR | Status: DC | PRN

## 2014-05-05 MED ORDER — DOFETILIDE 500 MCG PO CAPS
500.0000 ug | ORAL_CAPSULE | Freq: Two times a day (BID) | ORAL | Status: DC
  Administered 2014-05-05 – 2014-05-06 (×2): 500 ug via ORAL
  Filled 2014-05-05 (×4): qty 1

## 2014-05-05 MED ORDER — ROCURONIUM BROMIDE 100 MG/10ML IV SOLN
INTRAVENOUS | Status: DC | PRN
  Administered 2014-05-05: 10 mg via INTRAVENOUS
  Administered 2014-05-05: 50 mg via INTRAVENOUS

## 2014-05-05 MED ORDER — LACTATED RINGERS IV SOLN: INTRAVENOUS | Status: DC

## 2014-05-05 MED ORDER — GLYCOPYRROLATE 0.2 MG/ML IJ SOLN
INTRAMUSCULAR | Status: AC
  Filled 2014-05-05: qty 2

## 2014-05-05 MED ORDER — GLYCOPYRROLATE 0.2 MG/ML IJ SOLN
INTRAMUSCULAR | Status: DC | PRN
  Administered 2014-05-05: 0.4 mg via INTRAVENOUS

## 2014-05-05 MED ORDER — HYDROCODONE-ACETAMINOPHEN 5-325 MG PO TABS
1.0000 | ORAL_TABLET | ORAL | Status: DC | PRN
  Administered 2014-05-05 – 2014-05-06 (×4): 2 via ORAL
  Filled 2014-05-05 (×4): qty 2

## 2014-05-05 MED ORDER — LIDOCAINE HCL (CARDIAC) 20 MG/ML IV SOLN
INTRAVENOUS | Status: AC
  Filled 2014-05-05: qty 5

## 2014-05-05 MED ORDER — DILTIAZEM HCL ER 120 MG PO CP24
120.0000 mg | ORAL_CAPSULE | Freq: Every day | ORAL | Status: DC
  Administered 2014-05-05 – 2014-05-06 (×2): 120 mg via ORAL
  Filled 2014-05-05 (×2): qty 1

## 2014-05-05 MED ORDER — CEFAZOLIN SODIUM-DEXTROSE 2-3 GM-% IV SOLR
2.0000 g | Freq: Four times a day (QID) | INTRAVENOUS | Status: AC
Start: 2014-05-05 — End: 2014-05-06
  Administered 2014-05-05 – 2014-05-06 (×2): 2 g via INTRAVENOUS
  Filled 2014-05-05 (×2): qty 50

## 2014-05-05 MED ORDER — MENTHOL 3 MG MT LOZG: 1.0000 | LOZENGE | OROMUCOSAL | Status: DC | PRN

## 2014-05-05 MED ORDER — LYSINE 500 MG PO TABS: 500.0000 mg | ORAL_TABLET | Freq: Three times a day (TID) | ORAL | Status: DC

## 2014-05-05 MED ORDER — PHENYLEPHRINE HCL 10 MG/ML IJ SOLN
INTRAMUSCULAR | Status: DC | PRN
  Administered 2014-05-05 (×5): 80 ug via INTRAVENOUS

## 2014-05-05 SURGICAL SUPPLY — 50 items
BLADE SAW SGTL 18X1.27X75 (BLADE) ×2 IMPLANT
BLADE SAW SGTL 18X1.27X75MM (BLADE) ×1
BLADE SURG ROTATE 9660 (MISCELLANEOUS) IMPLANT
CAPT HIP PF MOP ×3 IMPLANT
CELLS DAT CNTRL 66122 CELL SVR (MISCELLANEOUS) ×1 IMPLANT
COVER PERINEAL POST (MISCELLANEOUS) ×3 IMPLANT
COVER SURGICAL LIGHT HANDLE (MISCELLANEOUS) ×3 IMPLANT
DRAPE C-ARM 42X72 X-RAY (DRAPES) ×6 IMPLANT
DRAPE STERI IOBAN 125X83 (DRAPES) ×3 IMPLANT
DRAPE U-SHAPE 47X51 STRL (DRAPES) ×9 IMPLANT
DRSG AQUACEL AG ADV 3.5X10 (GAUZE/BANDAGES/DRESSINGS) ×3 IMPLANT
DURAPREP 26ML APPLICATOR (WOUND CARE) ×3 IMPLANT
ELECT BLADE 4.0 EZ CLEAN MEGAD (MISCELLANEOUS)
ELECT CAUTERY BLADE 6.4 (BLADE) ×3 IMPLANT
ELECT REM PT RETURN 9FT ADLT (ELECTROSURGICAL) ×3
ELECTRODE BLDE 4.0 EZ CLN MEGD (MISCELLANEOUS) IMPLANT
ELECTRODE REM PT RTRN 9FT ADLT (ELECTROSURGICAL) ×1 IMPLANT
FACESHIELD WRAPAROUND (MASK) ×6 IMPLANT
GLOVE BIO SURGEON STRL SZ8 (GLOVE) ×15 IMPLANT
GLOVE BIOGEL PI IND STRL 6.5 (GLOVE) ×2 IMPLANT
GLOVE BIOGEL PI IND STRL 8 (GLOVE) ×2 IMPLANT
GLOVE BIOGEL PI INDICATOR 6.5 (GLOVE) ×4
GLOVE BIOGEL PI INDICATOR 8 (GLOVE) ×4
GLOVE ECLIPSE 6.5 STRL STRAW (GLOVE) ×6 IMPLANT
GOWN STRL REUS W/ TWL LRG LVL3 (GOWN DISPOSABLE) ×1 IMPLANT
GOWN STRL REUS W/ TWL XL LVL3 (GOWN DISPOSABLE) ×3 IMPLANT
GOWN STRL REUS W/TWL LRG LVL3 (GOWN DISPOSABLE) ×2
GOWN STRL REUS W/TWL XL LVL3 (GOWN DISPOSABLE) ×6
KIT BASIN OR (CUSTOM PROCEDURE TRAY) ×3 IMPLANT
KIT ROOM TURNOVER OR (KITS) ×3 IMPLANT
LINER BOOT UNIVERSAL DISP (MISCELLANEOUS) ×3 IMPLANT
MANIFOLD NEPTUNE II (INSTRUMENTS) ×3 IMPLANT
NS IRRIG 1000ML POUR BTL (IV SOLUTION) ×3 IMPLANT
PACK TOTAL JOINT (CUSTOM PROCEDURE TRAY) ×3 IMPLANT
PAD ARMBOARD 7.5X6 YLW CONV (MISCELLANEOUS) ×6 IMPLANT
RTRCTR WOUND ALEXIS 18CM MED (MISCELLANEOUS) ×3
STAPLER VISISTAT 35W (STAPLE) ×3 IMPLANT
SUT ETHIBOND NAB CT1 #1 30IN (SUTURE) ×9 IMPLANT
SUT VIC AB 0 CT1 27 (SUTURE)
SUT VIC AB 0 CT1 27XBRD ANBCTR (SUTURE) IMPLANT
SUT VIC AB 1 CT1 27 (SUTURE) ×2
SUT VIC AB 1 CT1 27XBRD ANBCTR (SUTURE) ×1 IMPLANT
SUT VIC AB 2-0 CT1 27 (SUTURE) ×2
SUT VIC AB 2-0 CT1 TAPERPNT 27 (SUTURE) ×1 IMPLANT
SUT VLOC 180 0 24IN GS25 (SUTURE) ×3 IMPLANT
TOWEL OR 17X24 6PK STRL BLUE (TOWEL DISPOSABLE) ×3 IMPLANT
TOWEL OR 17X26 10 PK STRL BLUE (TOWEL DISPOSABLE) ×6 IMPLANT
TRAY FOLEY CATH 14FR (SET/KITS/TRAYS/PACK) IMPLANT
WATER STERILE IRR 1000ML POUR (IV SOLUTION) ×6 IMPLANT
YANKAUER SUCT BULB TIP NO VENT (SUCTIONS) ×3 IMPLANT

## 2014-05-05 NOTE — Anesthesia Preprocedure Evaluation (Addendum)
Anesthesia Evaluation  Patient identified by MRN, date of birth, ID band Patient awake    Reviewed: Allergy & Precautions, H&P , NPO status , Patient's Chart, lab work & pertinent test results  Airway Mallampati: II TM Distance: >3 FB Neck ROM: Full    Dental no notable dental hx. (+) Teeth Intact, Dental Advisory Given   Pulmonary neg pulmonary ROS, sleep apnea and Continuous Positive Airway Pressure Ventilation ,  breath sounds clear to auscultation  Pulmonary exam normal       Cardiovascular + CAD and + Peripheral Vascular Disease negative cardio ROS  + dysrhythmias Atrial Fibrillation + pacemaker Rhythm:Regular Rate:Normal     Neuro/Psych Depression negative neurological ROS     GI/Hepatic negative GI ROS, Neg liver ROS,   Endo/Other  negative endocrine ROS  Renal/GU negative Renal ROS  negative genitourinary   Musculoskeletal   Abdominal   Peds  Hematology negative hematology ROS (+)   Anesthesia Other Findings   Reproductive/Obstetrics negative OB ROS                          Anesthesia Physical Anesthesia Plan  ASA: III  Anesthesia Plan: General   Post-op Pain Management:    Induction: Intravenous  Airway Management Planned: Oral ETT  Additional Equipment:   Intra-op Plan:   Post-operative Plan: Extubation in OR  Informed Consent: I have reviewed the patients History and Physical, chart, labs and discussed the procedure including the risks, benefits and alternatives for the proposed anesthesia with the patient or authorized representative who has indicated his/her understanding and acceptance.   Dental advisory given  Plan Discussed with: CRNA and Surgeon  Anesthesia Plan Comments:        Anesthesia Quick Evaluation

## 2014-05-05 NOTE — Interval H&P Note (Signed)
History and Physical Interval Note:  05/05/2014 9:17 AM  [name]  has presented today for surgery, with the diagnosis of RIGHT HIP DEGENERATIVE JOINT DISEASE  The various methods of treatment have been discussed with the patient and family. After consideration of risks, benefits and other options for treatment, the patient has consented to  Procedure(s): TOTAL HIP ARTHROPLASTY ANTERIOR APPROACH (Right) as a surgical intervention .  The patient's history has been reviewed, patient examined, no change in status, stable for surgery.  I have reviewed the patient's chart and labs.  Questions were answered to the patient's satisfaction.     Maejor Erven G

## 2014-05-05 NOTE — Progress Notes (Signed)
Patient states he has home CPAP but that he isn't going to bother with it tonight. Instructed patient and nurse to call if patient changes his mind.

## 2014-05-05 NOTE — Anesthesia Postprocedure Evaluation (Signed)
  Anesthesia Post-op Note  Patient: [name]  Procedure(s) Performed: Procedure(s): TOTAL HIP ARTHROPLASTY ANTERIOR APPROACH (Right)  Patient Location: PACU  Anesthesia Type:General  Level of Consciousness: awake, alert , oriented and patient cooperative  Airway and Oxygen Therapy: Patient Spontanous Breathing  Post-op Pain: moderate  Post-op Assessment: Post-op Vital signs reviewed, Patient's Cardiovascular Status Stable, Respiratory Function Stable, Patent Airway and No signs of Nausea or vomiting  Post-op Vital Signs: stable  Last Vitals:  Filed Vitals:   05/05/14 1400  BP: 120/61  Pulse: 76  Temp: 37.1 C  Resp: 13    Complications: No apparent anesthesia complications

## 2014-05-05 NOTE — Op Note (Signed)
PRE-OP DIAGNOSIS:  RIGHT HIP DEGENERATIVE JOINT DISEASE POST-OP DIAGNOSIS:  same PROCEDURE: RIGHT TOTAL HIP ARTHROPLASTY ANTERIOR APPROACH ANESTHESIA:  General SURGEON:  Melrose Nakayama MD ASSISTANT:  Loni Dolly PA-C   INDICATIONS FOR PROCEDURE:  The patient is a 73 y.o. male with a long history of a painful hip.  This has persisted despite multiple conservative measures.  The patient has persisted with pain and dysfunction making rest and activity difficult.  A total hip replacement is offered as surgical treatment.  Informed operative consent was obtained after discussion of possible complications including reaction to anesthesia, infection, neurovascular injury, dislocation, DVT, PE, and death.  The importance of the postoperative rehab program to optimize result was stressed with the patient.  SUMMARY OF FINDINGS AND PROCEDURE:  Under general anesthesia through a anterior approach an the Hana table a right THR was performed.  The patient had severe degenerative change and excellent bone quality.  We used DePuy components to replace the hip and these were size KA 13 Corail femur capped with a +8.5 69mm stainless steel hip ball.  On the acetabular side we used a size 52 Gription shell with a  plus 4 neutral polyethylene liner.  We did use a hole eliminator.  Loni Dolly PA-C assisted throughout and was invaluable to the completion of the case in that he helped position and retract while I performed the procedure.  He also closed simultaneously to help minimize OR time.  I used fluoroscopy throughout the case to check position of implants and leg lengths and read all of these views myself.  DESCRIPTION OF PROCEDURE:  The patient was taken to the OR suite where general anesthetic was applied.  The patient was then positioned on the Hana table supine.  All bony prominences were appropriately padded.  Prep and drape was then performed in normal sterile fashion.  The patient was given kefzol preoperative  antibiotic and an appropriate time out was performed.  We then took an anterior approach to the right hip.  Dissection was taken through adipose to the tensor fascia lata fascia.  This structure was incised longitudinally and we dissected in the intermuscular interval just medial to this muscle.  Cobra retractors were placed superior and inferior to the femoral neck superficial to the capsule.  A capsular incision was then made and the retractors were placed along the femoral neck.  Xray was brought in to get a good level for the femoral neck cut which was made with an oscillating saw and osteotome.  The femoral head was removed with a corkscrew.  The acetabulum was exposed and some labral tissues were excised. Reaming was taken to the inside wall of the pelvis and sequentially up to 1 mm smaller than the actual component.  A trial of components was done and then the aforementioned acetabular shell was placed in appropriate tilt and anteversion confirmed by fluoroscopy. The liner was placed along with the hole eliminator and attention was turned to the femur.  The leg was brought down and over into adduction and the elevator bar was used to raise the femur up gently in the wound.  The piriformis was released with care taken to preserve the obturator internus attachment and all of the posterior capsule. The femur was reamed and then broached to the appropriate size.  A trial reduction was done and the aforementioned head and neck assembly gave [place] the best stability in extension with external rotation.  Leg lengths were felt to be about equal by fluoroscopic  exam.  The trial components were removed and the wound irrigated.  We then placed the femoral component in appropriate anteversion.  The head was applied to a dry stem neck and the hip again reduced.  It was again stable in the aforementioned position.  The would was irrigated again followed by re-approximation of anterior capsule with ethibond suture. Tensor  fascia was repaired with V-loc suture  followed by subcutaneous closure with #O and #2 undyed vicryl.  Skin was closed with a subQ stitch and steristrips followed by a sterile dressing.  EBL and IOF can be obtained from anesthesia records.  DISPOSITION:  The patient was extubated in the OR and taken to PACU in stable condition to be admitted to the Orthopedic Surgery for appropriate post-op care to include perioperative antibiotics and DVT prophylaxis.

## 2014-05-05 NOTE — Transfer of Care (Signed)
Immediate Anesthesia Transfer of Care Note  Patient: [name]  Procedure(s) Performed: Procedure(s): TOTAL HIP ARTHROPLASTY ANTERIOR APPROACH (Right)  Patient Location: PACU  Anesthesia Type:General  Level of Consciousness: awake, alert  and oriented  Airway & Oxygen Therapy: Patient Spontanous Breathing and Patient connected to nasal cannula oxygen  Post-op Assessment: Report given to PACU RN and Post -op Vital signs reviewed and stable  Post vital signs: Reviewed and stable  Complications: No apparent anesthesia complications

## 2014-05-05 NOTE — Plan of Care (Signed)
Problem: Consults Goal: Diagnosis- Total Joint Replacement Primary Total Hip Right     

## 2014-05-06 ENCOUNTER — Encounter (HOSPITAL_COMMUNITY): Payer: Self-pay | Admitting: *Deleted

## 2014-05-06 LAB — BASIC METABOLIC PANEL
Anion gap: 11 (ref 5–15)
BUN: 16 mg/dL (ref 6–23)
CALCIUM: 9.5 mg/dL (ref 8.4–10.5)
CHLORIDE: 104 meq/L (ref 96–112)
CO2: 26 mEq/L (ref 19–32)
CREATININE: 0.94 mg/dL (ref 0.50–1.35)
GFR calc Af Amer: >90 mL/min (ref >90)
GFR calc non Af Amer: 80 mL/min — ABNORMAL LOW (ref >90)
Glucose, Bld: 143 mg/dL — ABNORMAL HIGH (ref 70–99)
Potassium: 4.4 mEq/L (ref 3.7–5.3)
Sodium: 141 mEq/L (ref 137–147)

## 2014-05-06 LAB — CBC
HCT: 36 % — ABNORMAL LOW (ref 39.0–52.0)
Hemoglobin: 12.3 g/dL — ABNORMAL LOW (ref 13.0–17.0)
MCH: 31.9 pg (ref 26.0–34.0)
MCHC: 34.2 g/dL (ref 30.0–36.0)
MCV: 93.3 fL (ref 78.0–100.0)
Platelets: 187 10*3/uL (ref 150–400)
RBC: 3.86 MIL/uL — ABNORMAL LOW (ref 4.22–5.81)
RDW: 13.9 % (ref 11.5–15.5)
WBC: 12.9 10*3/uL — ABNORMAL HIGH (ref 4.0–10.5)

## 2014-05-06 MED ORDER — HYDROCODONE-ACETAMINOPHEN 5-325 MG PO TABS: 1.0000 | ORAL_TABLET | ORAL | Status: DC | PRN

## 2014-05-06 MED ORDER — METHOCARBAMOL 500 MG PO TABS: 500.0000 mg | ORAL_TABLET | Freq: Four times a day (QID) | ORAL | Status: DC | PRN

## 2014-05-06 NOTE — Progress Notes (Signed)
Subjective: 1 Day Post-Op Procedure(s) (LRB): TOTAL HIP ARTHROPLASTY ANTERIOR APPROACH (Right)  Activity level:  wbat Diet tolerance:  eatingwell Voiding:  ok Patient reports pain as mild.    Objective: Vital signs in last 24 hours: Temp:  [97.1 F (36.2 C)-98.7 F (37.1 C)] 98.3 F (36.8 C) (08/26 0600) Pulse Rate:  [55-79] 60 (08/26 0600) Resp:  [7-23] 16 (08/26 0600) BP: (98-128)/(45-66) 115/53 mmHg (08/26 0600) SpO2:  [95 %-100 %] 98 % (08/26 0600) Weight:  [94.348 kg (208 lb)] 94.348 kg (208 lb) (08/25 0848)  Labs:  Recent Labs  05/06/14 0458  HGB 12.3*    Recent Labs  05/06/14 0458  WBC 12.9*  RBC 3.86*  HCT 36.0*  PLT 187    Recent Labs  05/06/14 0458  NA 141  K 4.4  CL 104  CO2 26  BUN 16  CREATININE 0.94  GLUCOSE 143*  CALCIUM 9.5   No results found for this basename: LABPT, INR,  in the last 72 hours  Physical Exam:  Neurologically intact ABD soft Neurovascular intact Sensation intact distally Intact pulses distally Dorsiflexion/Plantar flexion intact Incision: dressing C/D/I and no drainage No cellulitis present Compartment soft  Assessment/Plan:  1 Day Post-Op Procedure(s) (LRB): TOTAL HIP ARTHROPLASTY ANTERIOR APPROACH (Right) Advance diet Up with therapy D/C IV fluids Discharge home with home health today if he continues to do well.  Continue on eliquis for DVT prevention. Follow up in office 2 weeks post op.    [name], [name] 05/06/2014, 7:53 AM

## 2014-05-06 NOTE — Discharge Summary (Signed)
Patient ID: [name] MRN: [id number] DOB/AGE: 1941-08-11 73 y.o.  Admit date: 05/05/2014 Discharge date: 05/06/2014  Admission Diagnoses:  Principal Problem:   Degenerative joint disease (DJD) of hip   Discharge Diagnoses:  Same  Past Medical History  Diagnosis Date  . Hyperlipidemia     takes Tricor and Lipitor daily  . Coronary artery disease     LAD stenting 2004 non-DES  . Atrial fibrillation     takes Tikosyn and Eliquis daily  . Peripheral vascular disease   . Allergic rhinitis     takes Allegra daily  . Arthritis   . Bilateral popliteal artery aneurysm   . Pacemaker   . Allergic rhinitis, cause unspecified 06/22/2012  . Insomnia     takes Ambien nightly as needed  . OSA (obstructive sleep apnea)     CPAP  . Pleurisy     early 2's  . 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 Joint pain   . Joint swelling   . Dysphagia     occasionally  . History of colon polyps   . Diverticulosis   . Urinary urgency   . Depression     took Zoloft 67yrs ago but nothing now  . History of blood clots 2003    left leg prior to fem pop  . DVT (deep venous thrombosis)     Surgeries: Procedure(s): TOTAL HIP ARTHROPLASTY ANTERIOR APPROACH on 05/05/2014   Consultants:    Discharged Condition: Improved  Hospital Course: [name] is an 73 y.o. male who was admitted 05/05/2014 for operative treatment ofDegenerative joint disease (DJD) of hip. Patient has severe unremitting pain that affects sleep, daily activities, and work/hobbies. After pre-op clearance the patient was taken to the operating room on 05/05/2014 and underwent  Procedure(s): TOTAL HIP ARTHROPLASTY ANTERIOR APPROACH.    Patient was given perioperative antibiotics: Anti-infectives   Start     Dose/Rate Route Frequency Ordered Stop   05/05/14 1800  ceFAZolin (ANCEF) IVPB 2 g/50 mL premix     2 g 100 mL/hr over 30 Minutes Intravenous Every 6 hours 05/05/14 1712 05/06/14 0107   05/05/14  0600  ceFAZolin (ANCEF) IVPB 2 g/50 mL premix     2 g 100 mL/hr over 30 Minutes Intravenous On call to O.R. 05/04/14 1332 05/05/14 1009       Patient was given sequential compression devices, early ambulation, and chemoprophylaxis to prevent DVT.  Patient benefited maximally from hospital stay and there were no complications.    Recent vital signs: Patient Vitals for the past 24 hrs:  BP Temp Temp src Pulse Resp SpO2 Height Weight  05/06/14 0600 115/53 mmHg 98.3 F (36.8 C) - 60 16 98 % - -  05/06/14 0400 - - - - 18 98 % - -  05/06/14 0146 102/45 mmHg 97.4 F (36.3 C) - 66 18 96 % - -  05/06/14 0000 - - - - 18 98 % - -  05/05/14 2016 114/60 mmHg 98.6 F (37 C) Oral 79 18 96 % - -  05/05/14 2000 - - - - 18 99 % - -  05/05/14 1906 117/59 mmHg 97.6 F (36.4 C) Oral 62 18 99 % - -  05/05/14 1645 98/66 mmHg 97.7 F (36.5 C) - 72 23 100 % - -  05/05/14 1630 115/60 mmHg - - 62 16 95 % - -  05/05/14 1615 121/66 mmHg - - 64 11 100 % - -  05/05/14 1600 116/48 mmHg - - 55 7 96 % - -  05/05/14 1530 121/63 mmHg - - 78 14 100 % - -  05/05/14 1515 115/58 mmHg - - 79 19 100 % - -  05/05/14 1500 121/57 mmHg - - 77 16 100 % - -  05/05/14 1445 108/49 mmHg - - 73 14 99 % - -  05/05/14 1430 115/60 mmHg - - 73 12 100 % - -  05/05/14 1415 111/59 mmHg - - 69 16 96 % - -  05/05/14 1400 120/61 mmHg 98.7 F (37.1 C) - 76 13 100 % - -  05/05/14 1345 115/56 mmHg - - 76 11 100 % - -  05/05/14 1330 128/65 mmHg - - 73 18 100 % - -  05/05/14 1315 116/64 mmHg - - 71 7 100 % - -  05/05/14 1257 118/63 mmHg - - 77 16 99 % - -  05/05/14 1247 116/64 mmHg 98.6 F (37 C) - 77 14 99 % - -  05/05/14 0848 105/64 mmHg 97.1 F (36.2 C) Oral 63 15 99 % 6\' 3"  (1.905 m) 94.348 kg (208 lb)     Recent laboratory studies:  Recent Labs  05/06/14 0458  WBC 12.9*  HGB 12.3*  HCT 36.0*  PLT 187  NA 141  K 4.4  CL 104  CO2 26  BUN 16  CREATININE 0.94  GLUCOSE 143*  CALCIUM 9.5     Discharge Medications:      Medication List         amoxicillin-clavulanate 875-125 MG per tablet  Commonly known as:  AUGMENTIN  Take 1 tablet by mouth 2 (two) times daily.     atorvastatin 80 MG tablet  Commonly known as:  LIPITOR  Take 40 mg by mouth daily.     CALCIUM PO  Take 600 mg by mouth 2 (two) times daily.     diltiazem 120 MG 24 hr capsule  Commonly known as:  DILACOR XR  Take 1 capsule (120 mg total) by mouth daily.     dofetilide 500 MCG capsule  Commonly known as:  TIKOSYN  Take 500 mcg by mouth 2 (two) times daily.     ELIQUIS 2.5 MG Tabs tablet  Generic drug:  apixaban  Take 2.5 mg by mouth 2 (two) times daily.     fenofibrate 145 MG tablet  Commonly known as:  TRICOR  Take 145 mg by mouth daily.     fexofenadine 180 MG tablet  Commonly known as:  ALLEGRA  Take 180 mg by mouth daily.     Fish Oil 1200 MG Caps  Take 1,200 mg by mouth daily.     FOLIC ACID PO  Take 1 tablet by mouth daily.     GLUCOSAMINE CHONDROITIN COMPLX PO  Take 1 tablet by mouth 2 (two) times daily.     HYDROcodone-acetaminophen 5-325 MG per tablet  Commonly known as:  NORCO/VICODIN  Take 1-2 tablets by mouth every 4 (four) hours as needed (breakthrough pain).     Lysine 500 MG Tabs  Take 500 mg by mouth 3 (three) times daily.     Magnesium 250 MG Tabs  Take 250 tablets by mouth 2 (two) times daily.     methocarbamol 500 MG tablet  Commonly known as:  ROBAXIN  Take 1 tablet (500 mg total) by mouth every 6 (six) hours as needed for muscle spasms.     potassium chloride 10 MEQ tablet  Commonly known as:  KLOR-CON M10  Take 1 tablet (10 mEq total) by mouth 2 (two) times daily.     tadalafil 20 MG tablet  Commonly known as:  CIALIS  Take 1 tablet (20 mg total) by mouth daily as needed. For ED     triamcinolone 55 MCG/ACT Aero nasal inhaler  Commonly known as:  NASACORT  Place 2 sprays into both nostrils daily.     VITAMIN B 12 PO  Take 1 tablet by mouth daily.     zolpidem 10 MG tablet   Commonly known as:  AMBIEN  Take 2.5-5 mg by mouth at bedtime as needed for sleep.        Diagnostic Studies: Dg Hip Operative Right  2014-05-28   CLINICAL DATA:  Total right hip anterior approach  EXAM: DG OPERATIVE RIGHT HIP  TECHNIQUE: A single spot fluoroscopic AP image of the right hip is submitted.  FLUOROSCOPY TIME:  26 seconds  COMPARISON:  None.  FINDINGS: Intraoperative fluoroscopic spot radiographs during right total hip arthroplasty.  Right total hip arthroplasty in satisfactory position.  Prior left total hip arthroplasty.  IMPRESSION: Intraoperative fluoroscopic spot radiographs during right total hip arthroplasty.   Electronically Signed   By: Julian Hy M.D.   On: 05-28-2014 13:52    Disposition: 06-Home-Health Care Svc      Discharge Instructions   Call MD / Call 911    Complete by:  As directed   If you experience chest pain or shortness of breath, CALL 911 and be transported to the hospital emergency room.  If you develope a fever above 101 F, pus (white drainage) or increased drainage or redness at the wound, or calf pain, call your surgeon's office.     Constipation Prevention    Complete by:  As directed   Drink plenty of fluids.  Prune juice may be helpful.  You may use a stool softener, such as Colace (over the counter) 100 mg twice a day.  Use MiraLax (over the counter) for constipation as needed.     Diet - low sodium heart healthy    Complete by:  As directed      Increase activity slowly as tolerated    Complete by:  As directed            Follow-up Information   Follow up with Hessie Dibble, MD. Call in 2 weeks.   Specialty:  Orthopedic Surgery   Contact information:   [place] [address] [phone]        Signed: Rich Fuchs 05/06/2014, 8:10 AM

## 2014-05-06 NOTE — Evaluation (Signed)
Physical Therapy Evaluation Patient Details Name: [name] MRN: [id number] DOB: [birth year] Today's Date: 05/06/2014   History of Present Illness  pt presents with R THA.  pt with recent L THA.    Clinical Impression  Pt moving great!  No physical A needed and minimal cueing for sequencing only.  Pt has all needed DME and good support from family to D/C to home.  No further acute PT needs at this time.  Will sign off.      Follow Up Recommendations Home health PT;Supervision - Intermittent    Equipment Recommendations  None recommended by PT    Recommendations for Other Services       Precautions / Restrictions Precautions Precautions: None Restrictions Weight Bearing Restrictions: Yes RLE Weight Bearing: Weight bearing as tolerated      Mobility  Bed Mobility Overal bed mobility: Modified Independent             General bed mobility comments: pt moves slowly, but demos good technique and no A needed.    Transfers Overall transfer level: Modified independent Equipment used: Rolling walker (2 wheeled)             General transfer comment: No A needed.    Ambulation/Gait Ambulation/Gait assistance: Modified independent (Device/Increase time) Ambulation Distance (Feet): 300 Feet Assistive device: Rolling walker (2 wheeled) Gait Pattern/deviations: Step-through pattern;Decreased stride length;Decreased step length - left;Decreased stance time - right   Gait velocity interpretation: Below normal speed for age/gender General Gait Details: cues for equalizing step length and more fluid gait pattern.    Stairs Stairs: Yes Stairs assistance: Supervision Stair Management: One rail Left;Step to pattern;Forwards Number of Stairs: 2 General stair comments: cues for gait sequencing on steps.  pt demos good safety and use of single rail.  No physical A needed.    Wheelchair Mobility    Modified Rankin (Stroke Patients Only)       Balance Overall balance  assessment: No apparent balance deficits (not formally assessed)                                           Pertinent Vitals/Pain Pain Assessment: 0-10 Pain Score: 2  Pain Location: R Thigh Pain Descriptors / Indicators: Cramping Pain Intervention(s): Premedicated before session;Repositioned    Home Living Family/patient expects to be discharged to:: Private residence Living Arrangements: Spouse/significant other Available Help at Discharge: Family;Available 24 hours/day Type of Home: House Home Access: Stairs to enter Entrance Stairs-Rails: Left Entrance Stairs-Number of Steps: 3 Home Layout: Two level;Able to live on main level with bedroom/bathroom Home Equipment: Gilford Rile - 2 wheels;Bedside commode;Adaptive equipment      Prior Function Level of Independence: Independent               Hand Dominance        Extremity/Trunk Assessment   Upper Extremity Assessment: Overall WFL for tasks assessed           Lower Extremity Assessment: RLE deficits/detail RLE Deficits / Details: AROM WFL.  Strength limited by post-op weakness.         Communication   Communication: No difficulties  Cognition Arousal/Alertness: Awake/alert Behavior During Therapy: WFL for tasks assessed/performed Overall Cognitive Status: Within Functional Limits for tasks assessed                      General Comments  Exercises Total Joint Exercises Ankle Circles/Pumps: AROM;Both;10 reps Quad Sets: AROM;Both;5 reps Short Arc Quad: AROM;Right;5 reps      Assessment/Plan    PT Assessment All further PT needs can be met in the next venue of care  PT Diagnosis Abnormality of gait   PT Problem List Decreased strength;Decreased mobility;Decreased knowledge of use of DME  PT Treatment Interventions     PT Goals (Current goals can be found in the Care Plan section) Acute Rehab PT Goals PT Goal Formulation: No goals set, d/c therapy    Frequency      Barriers to discharge        Co-evaluation               End of Session   Activity Tolerance: Patient tolerated treatment well Patient left: in chair;with call bell/phone within reach Nurse Communication: Mobility status         Time: 5189-8421 PT Time Calculation (min): 39 min   Charges:   PT Evaluation $Initial PT Evaluation Tier I: 1 Procedure PT Treatments $Gait Training: 23-37 mins   PT G CodesCatarina Hartshorn, Byng 05/06/2014, 8:46 AM

## 2014-05-06 NOTE — Progress Notes (Signed)
Utilization review completed.  

## 2014-05-06 NOTE — Progress Notes (Signed)
Patient discharged to home accompanied by wife. Discharge instructions and rx given and explained and patient stated understanding. IV was removed and patient left unit in a stable condition with all personal belongings via wheelchair. 

## 2014-05-06 NOTE — Care Management Note (Signed)
CARE MANAGEMENT NOTE 05/06/2014  Patient:  [name], [name]   Account Number:  [id number]  Date Initiated:  05/06/2014  Documentation initiated by:  Ricki Miller  Subjective/Objective Assessment:   73 yr old male s/p right total hip arthroplasty.     Action/Plan:   Patient preoperatively setup with [facility], no changes. Has family support at discharge. Patient has rolling walker and 3in1.   Anticipated DC Date:  05/06/2014   Anticipated DC Plan:  [place]  CM consult      [facility] Choice  HOME HEALTH   Choice offered to / List presented to:  C-1 Patient   DME arranged  NA        [place] arranged  [facility]      [place].   Status of service:  Completed, signed off Medicare Important Message given?  NA - LOS <3 / Initial given by admissions (If response is "NO", the following Medicare IM given date fields will be blank) Date Medicare IM given:   Medicare IM given by:   Date Additional Medicare IM given:   Additional Medicare IM given by:    Discharge Disposition:  [place]  Per UR Regulation:  Reviewed for med. necessity/level of care/duration of stay  If discussed at [place] of Stay Meetings, dates discussed:

## 2014-05-07 ENCOUNTER — Other Ambulatory Visit: Payer: Self-pay

## 2014-05-07 DIAGNOSIS — I495 Sick sinus syndrome: Secondary | ICD-10-CM

## 2014-05-07 MED ORDER — POTASSIUM CHLORIDE CRYS ER 10 MEQ PO TBCR: 10.0000 meq | EXTENDED_RELEASE_TABLET | Freq: Two times a day (BID) | ORAL | Status: DC

## 2014-05-30 ENCOUNTER — Other Ambulatory Visit: Payer: Self-pay | Admitting: Internal Medicine

## 2014-06-02 ENCOUNTER — Telehealth: Payer: Self-pay | Admitting: Internal Medicine

## 2014-06-02 NOTE — Telephone Encounter (Signed)
Rec'd from [facility]  forward 2 pages to Dr. John °

## 2014-06-09 ENCOUNTER — Encounter: Payer: Medicare Other | Admitting: Internal Medicine

## 2014-06-16 ENCOUNTER — Other Ambulatory Visit: Payer: Self-pay | Admitting: Otolaryngology

## 2014-06-16 DIAGNOSIS — R51 Headache: Secondary | ICD-10-CM

## 2014-06-16 DIAGNOSIS — R0981 Nasal congestion: Secondary | ICD-10-CM

## 2014-06-16 DIAGNOSIS — R519 Headache, unspecified: Secondary | ICD-10-CM

## 2014-06-23 ENCOUNTER — Encounter: Payer: Self-pay | Admitting: Internal Medicine

## 2014-06-23 ENCOUNTER — Ambulatory Visit (INDEPENDENT_AMBULATORY_CARE_PROVIDER_SITE_OTHER): Payer: Medicare Other | Admitting: Internal Medicine

## 2014-06-23 VITALS — BP 100/52 | HR 64 | Ht 75.0 in | Wt 207.4 lb

## 2014-06-23 DIAGNOSIS — I480 Paroxysmal atrial fibrillation: Secondary | ICD-10-CM

## 2014-06-23 DIAGNOSIS — Z950 Presence of cardiac pacemaker: Secondary | ICD-10-CM

## 2014-06-23 DIAGNOSIS — I495 Sick sinus syndrome: Secondary | ICD-10-CM

## 2014-06-23 LAB — MDC_IDC_ENUM_SESS_TYPE_INCLINIC
Brady Statistic RV Percent Paced: 4 %
Implantable Pulse Generator Model: 359524
Implantable Pulse Generator Serial Number: 66339555
Lead Channel Impedance Value: 351 Ohm
Lead Channel Pacing Threshold Amplitude: 0.9 V
Lead Channel Pacing Threshold Amplitude: 0.9 V
Lead Channel Pacing Threshold Pulse Width: 0.4 ms
Lead Channel Pacing Threshold Pulse Width: 0.4 ms
Lead Channel Pacing Threshold Pulse Width: 0.4 ms
Lead Channel Sensing Intrinsic Amplitude: 2.2 mV
Lead Channel Sensing Intrinsic Amplitude: 2.2 mV
Lead Channel Sensing Intrinsic Amplitude: 3.9 mV
Lead Channel Setting Pacing Amplitude: 1.5 V
Lead Channel Setting Pacing Amplitude: 2 V
Lead Channel Setting Pacing Pulse Width: 0.4 ms
MDC IDC MSMT LEADCHNL RA PACING THRESHOLD AMPLITUDE: 0.5 V
MDC IDC MSMT LEADCHNL RA PACING THRESHOLD AMPLITUDE: 0.5 V
MDC IDC MSMT LEADCHNL RA PACING THRESHOLD PULSEWIDTH: 0.4 ms
MDC IDC MSMT LEADCHNL RV IMPEDANCE VALUE: 331 Ohm
MDC IDC SESS DTM: 20151013184300
MDC IDC STAT BRADY RA PERCENT PACED: 57 %

## 2014-06-23 LAB — MAGNESIUM: Magnesium: 2.2 mg/dL (ref 1.5–2.5)

## 2014-06-23 NOTE — Patient Instructions (Signed)
Your physician recommends that you continue on your current medications as directed. Please refer to the Current Medication list given to you today.  Lab today: Magnesium  Your physician wants you to follow-up in: 6 months with Dr. Caryl Comes. You will receive a reminder letter in the mail two months in advance. If you don't receive a letter, please call our office to schedule the follow-up appointment.

## 2014-06-23 NOTE — Progress Notes (Signed)
Patient Care Team: Biagio Borg, MD as PCP - General (Internal Medicine) Deboraha Sprang, MD (Cardiology) Hessie Dibble, MD as Consulting Physician (Orthopedic Surgery)   HPI  [name] is a 73 y.o. 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   His pacemaker reached ERI 8/13 and he underwent generator replacement with a Biotronik device because of his CLS function  He takes dofetilide for his atrial fibrillation.potassium levels were normal in August. Magnesium has not been checked since January     Past Medical History  Diagnosis Date  . Hyperlipidemia     takes Tricor and Lipitor daily  . Coronary artery disease     LAD stenting 2004 non-DES  . Atrial fibrillation     takes Tikosyn and Eliquis daily  . Peripheral vascular disease   . Allergic rhinitis     takes Allegra daily  . Arthritis   . Bilateral popliteal artery aneurysm   . Pacemaker   . Allergic rhinitis, cause unspecified 06/22/2012  . Insomnia     takes Ambien nightly as needed  . OSA (obstructive sleep apnea)     CPAP  . Pleurisy     early 43's  . 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 Joint pain   . Joint swelling   . Dysphagia     occasionally  . History of colon polyps   . Diverticulosis   . Urinary urgency   . Depression     took Zoloft 70yrs ago but nothing now  . History of blood clots 2003    left leg prior to fem pop  . DVT (deep venous thrombosis)     Past Surgical History  Procedure Laterality Date  . Inguinal hernia repair Right   . Stent (unspec)      x2  . Pacemaker insertion      due to bradycardia  . Right and left fem-pop bypass    . Hand surgery Left   . Left knee surgery    . Cardiac catheterization  2004  . Hand and arm surgery Right     as a teenager  . Turbinate reduction    . Wisdom extracted     .  Colonoscopy    . Total hip arthroplasty Left 12/09/2013    Procedure: TOTAL HIP ARTHROPLASTY ANTERIOR APPROACH;  Surgeon: Hessie Dibble, MD;  Location: [facility];  Service: Orthopedics;  Laterality: Left;  left anterior total hip arthroplasty  . Joint replacement Left December 09, 2013    Left Hip   . Joint replacement Right Aug. 25, 2015    Right Hip  . Total hip arthroplasty Right 05/05/2014    Procedure: TOTAL HIP ARTHROPLASTY ANTERIOR APPROACH;  Surgeon: Hessie Dibble, MD;  Location: [facility];  Service: Orthopedics;  Laterality: Right;    Current Outpatient Prescriptions  Medication Sig Dispense Refill  . apixaban (ELIQUIS) 2.5 MG TABS tablet Take 2.5 mg by mouth 2 (two) times daily.      Marland Kitchen atorvastatin (LIPITOR) 80 MG tablet Take one-half tablet by  mouth daily  15 tablet  2  . CALCIUM PO Take 600 mg by mouth 2 (two) times daily.      . Cyanocobalamin (VITAMIN B 12 PO) Take 1 tablet by mouth daily.      Marland Kitchen DILT-XR 120 MG 24 hr  capsule Take 1 capsule by mouth  daily  30 capsule  2  . dofetilide (TIKOSYN) 500 MCG capsule Take 500 mcg by mouth 2 (two) times daily.      . fenofibrate (TRICOR) 145 MG tablet Take 1 tablet by mouth  daily  30 tablet  2  . fexofenadine (ALLEGRA) 180 MG tablet Take 180 mg by mouth daily.      Marland Kitchen FOLIC ACID PO Take 1 tablet by mouth daily.      Marland Kitchen GLUCOSAMINE CHONDROITIN COMPLX PO Take 1 tablet by mouth 2 (two) times daily.       Marland Kitchen Lysine 500 MG TABS Take 500 mg by mouth 3 (three) times daily.      . Magnesium 250 MG TABS Take 250 tablets by mouth 2 (two) times daily.       . Omega-3 Fatty Acids (FISH OIL) 1200 MG CAPS Take 1,200 mg by mouth daily.       . potassium chloride (KLOR-CON M10) 10 MEQ tablet Take 1 tablet (10 mEq total) by mouth 2 (two) times daily.  180 tablet  1  . tadalafil (CIALIS) 20 MG tablet Take 1 tablet (20 mg total) by mouth daily as needed. For ED  30 tablet  3  . triamcinolone (NASACORT) 55 MCG/ACT AERO nasal inhaler Place 2 sprays into both  nostrils daily.       Marland Kitchen zolpidem (AMBIEN) 10 MG tablet Take 2.5-5 mg by mouth at bedtime as needed for sleep.        No current facility-administered medications for this visit.    Allergies  Allergen Reactions  . Amiodarone     REACTION: peripheral neuropathy  . Niacin     REACTION: irregular heartbeat  . Ace Inhibitors     REACTION: cough  . Mometasone Furoate Other (See Comments)    (Nasonex) Causes nasal bleeding and nose bleeds    Review of Systems negative except from HPI and PMH  Physical Exam Ht 6\' 3"  (1.905 m)  Wt 207 lb 6.4 oz (94.076 kg)  BMI 25.92 kg/m2 Well developed and well nourished in no acute distress HENT normal E scleral and icterus clear Neck Supple JVP flat; carotids brisk and full Clear to ausculation  *Regular rate and rhythm, no murmurs gallops or rub Soft with active bowel sounds No clubbing cyanosis non-pitting and none Edema Alert and oriented, grossly normal motor and sensory function Skin Warm and Dry  ECG demonstrates atrial paced rhythm at 91 Intervals 16/09/36  Assessment and  Plan  Atrial fibrillation-paroxysmal  Symptomatic ventricular pacing  Pacemaker Biotronik The patient's device was interrogated and the information was fully reviewed.  The device was reprogrammed as above  Preoperative surgical evaluation   His device was reprogrammed to lower the threshold for initiation of rate response as he feels like he is chronotropic incompetent. It is interesting to note that 43%of his atrial sensed beats are hard to find. I will discuss this with Biotronik  He has decreased his ELIQUIS because ofBleeding associated with ejaculation.  I have reviewed with him the recent Pradaxa data suggesting benefit of lower doses even who isin a cohort in whom standard dosing should have been used.

## 2014-06-24 ENCOUNTER — Ambulatory Visit
Admission: RE | Admit: 2014-06-24 | Discharge: 2014-06-24 | Disposition: A | Discharge status: Home / Self Care | Payer: Medicare Other | Source: Ambulatory Visit | Attending: Otolaryngology | Admitting: Otolaryngology

## 2014-06-24 ENCOUNTER — Telehealth: Payer: Self-pay | Admitting: Family Medicine

## 2014-06-24 DIAGNOSIS — R519 Headache, unspecified: Secondary | ICD-10-CM

## 2014-06-24 DIAGNOSIS — R0981 Nasal congestion: Secondary | ICD-10-CM

## 2014-06-24 DIAGNOSIS — R51 Headache: Secondary | ICD-10-CM

## 2014-06-24 NOTE — Telephone Encounter (Signed)
Rec'd from [employer] and sport forward 2 page to [place]

## 2014-07-14 ENCOUNTER — Encounter: Payer: Self-pay | Admitting: *Deleted

## 2014-07-17 ENCOUNTER — Other Ambulatory Visit: Payer: Self-pay | Admitting: Internal Medicine

## 2014-07-21 ENCOUNTER — Other Ambulatory Visit: Payer: Self-pay

## 2014-07-21 MED ORDER — TADALAFIL 20 MG PO TABS: 20.0000 mg | ORAL_TABLET | Freq: Every day | ORAL | Status: DC | PRN

## 2014-08-20 ENCOUNTER — Encounter (HOSPITAL_COMMUNITY): Payer: Self-pay | Admitting: Internal Medicine

## 2014-11-09 ENCOUNTER — Other Ambulatory Visit: Payer: Self-pay

## 2014-11-09 DIAGNOSIS — I495 Sick sinus syndrome: Secondary | ICD-10-CM

## 2014-11-09 MED ORDER — POTASSIUM CHLORIDE CRYS ER 10 MEQ PO TBCR: 10.0000 meq | EXTENDED_RELEASE_TABLET | Freq: Two times a day (BID) | ORAL | Status: DC

## 2014-11-27 DIAGNOSIS — Z96643 Presence of artificial hip joint, bilateral: Secondary | ICD-10-CM | POA: Diagnosis not present

## 2015-01-06 ENCOUNTER — Telehealth: Payer: Self-pay | Admitting: Internal Medicine

## 2015-01-06 NOTE — Telephone Encounter (Signed)
Rec'd from [facility] forward 3 pages to Dr. Jenny Reichmann

## 2015-01-12 ENCOUNTER — Encounter: Payer: Self-pay | Admitting: *Deleted

## 2015-01-29 ENCOUNTER — Encounter: Payer: Self-pay | Admitting: Gastroenterology

## 2015-02-05 ENCOUNTER — Encounter: Payer: Self-pay | Admitting: Physician Assistant

## 2015-03-01 ENCOUNTER — Encounter: Payer: Self-pay | Admitting: Physician Assistant

## 2015-03-01 ENCOUNTER — Ambulatory Visit (INDEPENDENT_AMBULATORY_CARE_PROVIDER_SITE_OTHER): Payer: Medicare Other | Admitting: Physician Assistant

## 2015-03-01 ENCOUNTER — Telehealth: Payer: Self-pay | Admitting: *Deleted

## 2015-03-01 VITALS — BP 100/64 | HR 72 | Ht 75.0 in | Wt 214.0 lb

## 2015-03-01 DIAGNOSIS — Z7901 Long term (current) use of anticoagulants: Secondary | ICD-10-CM

## 2015-03-01 DIAGNOSIS — I480 Paroxysmal atrial fibrillation: Secondary | ICD-10-CM

## 2015-03-01 DIAGNOSIS — Z1211 Encounter for screening for malignant neoplasm of colon: Secondary | ICD-10-CM | POA: Diagnosis not present

## 2015-03-01 MED ORDER — POLYETHYLENE GLYCOL 3350 17 GM/SCOOP PO POWD: 1.0000 | Freq: Every day | ORAL | Status: DC

## 2015-03-01 NOTE — Telephone Encounter (Signed)
03/01/2015   RE: [name] DOB: 1941/06/20 MRN: [id number]   Dear Dr. Virl Axe,    We have scheduled the above patient for an endoscopic procedure. Our records show that he is on anticoagulation therapy.   Please advise as to how long the patient may come off his therapy of Eliquis prior to the procedure, which is scheduled for 04-06-2015.  Please fax back/ or route the completed form to [facility] at [phone].   Sincerely,    Cecille Rubin Hvozdovic PA-C

## 2015-03-01 NOTE — Patient Instructions (Signed)
You have been scheduled for a colonoscopy. Please follow written instructions given to you at your visit today.  Please pick up your prep supplies at the pharmacy within the next 1-3 days. If you use inhalers (even only as needed), please bring them with you on the day of your procedure.   

## 2015-03-01 NOTE — Progress Notes (Signed)
Patient ID: [name], male   DOB: [birth year], 74 y.o.   MRN: [id number].    HPI:  TYRENCE [name] is a 74 y.o.   male  referred by [name], MD for colonoscopy. Jamiah had a colonoscopy done by Dr. Dorinda Hill in June 2006 at that time he was noted to have left-sided diverticular disease and hemorrhoids. No polyps were noted. His father had colon polyps when he was in his 6s or 32s. Patient was advised to have a surveillance colonoscopy in 10 years and was recently reminded that it is time to schedule this. He has had no change in his bowel habits or stool caliber. He denies bloody or tarry stools. His appetite has been good and he has had no unusual weight loss.  His past medical history significant for pacemaker, atrial fibrillation, peripheral vascular disease, popliteal artery aneurysm, obstructive sleep apnea, diverticulosis, ulnar neuropathy, left cervical radiculopathy, erectile dysfunction, hyper lipidemia, and depression. He recently had his hips replaced. He has also had a name: Herniorrhaphy, stents 2, pacemaker due to bradycardia, right femoropopliteal bypass, left femoropopliteal bypass, left hand surgery, left knee surgery.   Past Medical History  Diagnosis Date  . Hyperlipidemia     takes Tricor and Lipitor daily  . Coronary artery disease     LAD stenting 2004 non-DES  . Atrial fibrillation     takes Tikosyn and Eliquis daily  . Peripheral vascular disease   . Allergic rhinitis     takes Allegra daily  . Arthritis   . Bilateral popliteal artery aneurysm   . Pacemaker   . Allergic rhinitis, cause unspecified 06/22/2012  . Insomnia     takes Ambien nightly as needed  . OSA (obstructive sleep apnea)     CPAP  . Pleurisy     early 28's  . 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 Joint pain   . Joint swelling   . Dysphagia     occasionally  . History of colon polyps   . Diverticulosis   . Urinary urgency   . Depression     took Zoloft  67yrs ago but nothing now  . History of blood clots 2003    left leg prior to fem pop  . DVT (deep venous thrombosis)     Past Surgical History  Procedure Laterality Date  . Inguinal hernia repair Right   . Stent (unspec)      x2  . Pacemaker insertion      due to bradycardia  . Right and left fem-pop bypass    . Hand surgery Left   . Left knee surgery    . Cardiac catheterization  2004  . Hand and arm surgery Right     as a teenager  . Turbinate reduction    . Wisdom extracted     . Colonoscopy    . Total hip arthroplasty Left 12/09/2013    Procedure: TOTAL HIP ARTHROPLASTY ANTERIOR APPROACH;  Surgeon: Velna Ochs, MD;  Location: [facility];  Service: Orthopedics;  Laterality: Left;  left anterior total hip arthroplasty  . Joint replacement Left December 09, 2013    Left Hip   . Joint replacement Right Aug. 25, 2015    Right Hip  . Total hip arthroplasty Right 05/05/2014    Procedure: TOTAL HIP ARTHROPLASTY ANTERIOR APPROACH;  Surgeon: Velna Ochs, MD;  Location: [facility];  Service: Orthopedics;  Laterality: Right;  . Permanent  pacemaker generator change N/A 06/17/2012    Procedure: PERMANENT PACEMAKER GENERATOR CHANGE;  Surgeon: Duke Salvia, MD;  Location: [facility] C[facility];  Service: Cardiovascular;  Laterality: N/A;   Family History  Problem Relation Age of Onset  . Diabetes Other     family hx  . Sleep apnea Other     family hx  . Diabetes Mother   . Heart disease Mother     Before age 84  . Heart disease Father     Before age 26  . Heart attack Father   . Stroke Father   . Aneurysm Father     bilat pop art aneurysms  . Peripheral vascular disease Father     Popliteal Aneurysm   History  Substance Use Topics  . Smoking status: Never Smoker   . Smokeless tobacco: Never Used  . Alcohol Use: No     Comment: nothing since 2000   Current Outpatient Prescriptions  Medication Sig Dispense Refill  . apixaban (ELIQUIS) 2.5 MG TABS tablet Take 2.5 mg by mouth 2 (two)  times daily.    Marland Kitchen atorvastatin (LIPITOR) 80 MG tablet Take one-half tablet by  mouth daily 45 tablet 3  . CALCIUM PO Take 600 mg by mouth 2 (two) times daily.    . Cyanocobalamin (VITAMIN B 12 PO) Take 1 tablet by mouth daily.    Marland Kitchen DILT-XR 120 MG 24 hr capsule Take 1 capsule by mouth  daily 90 capsule 3  . fenofibrate (TRICOR) 145 MG tablet Take 1 tablet by mouth  daily 90 tablet 3  . fexofenadine (ALLEGRA) 180 MG tablet Take 180 mg by mouth daily.    Marland Kitchen FOLIC ACID PO Take 1 tablet by mouth daily.    Marland Kitchen GLUCOSAMINE CHONDROITIN COMPLX PO Take 1 tablet by mouth 2 (two) times daily.     Marland Kitchen Lysine 500 MG TABS Take 500 mg by mouth 3 (three) times daily.    . Magnesium 250 MG TABS Take 250 mg by mouth 2 (two) times daily.     . Omega-3 Fatty Acids (FISH OIL) 1200 MG CAPS Take 1,200 mg by mouth daily.     . potassium chloride (KLOR-CON M10) 10 MEQ tablet Take 1 tablet (10 mEq total) by mouth 2 (two) times daily. 180 tablet 1  . tadalafil (CIALIS) 20 MG tablet Take 1 tablet (20 mg total) by mouth daily as needed. For ED 30 tablet 3  . TIKOSYN 500 MCG capsule Take 1 capsule by mouth  twice daily 180 capsule 3  . triamcinolone (NASACORT) 55 MCG/ACT AERO nasal inhaler Place 2 sprays into both nostrils daily.     Marland Kitchen zolpidem (AMBIEN) 10 MG tablet Take 2.5-5 mg by mouth at bedtime as needed for sleep.      No current facility-administered medications for this visit.   Allergies  Allergen Reactions  . Amiodarone     REACTION: peripheral neuropathy  . Niacin     REACTION: irregular heartbeat  . Ace Inhibitors     REACTION: cough  . Mometasone Furoate Other (See Comments)    (Nasonex) Causes nasal bleeding and nose bleeds     Review of Systems: Gen: Denies any fever, chills, sweats, anorexia, fatigue, weakness, malaise, weight loss, and sleep disorder CV: Denies chest pain, angina, palpitations, syncope, orthopnea, PND, peripheral edema, and claudication. Resp: Denies dyspnea at rest, dyspnea with  exercise, cough, sputum, wheezing, coughing up blood, and pleurisy. GI: Denies vomiting blood, jaundice, and fecal incontinence.   Denies dysphagia  or odynophagia. GU : Denies urinary burning, blood in urine, urinary frequency, urinary hesitancy, nocturnal urination, and urinary incontinence. MS: Denies joint pain, limitation of movement, and swelling, stiffness, low back pain, extremity pain. Denies muscle weakness, cramps, atrophy.  Derm: Denies rash, itching, dry skin, hives, moles, warts, or unhealing ulcers.  Psych: Denies depression, anxiety, memory loss, suicidal ideation, hallucinations, paranoia, and confusion. Heme: Denies bruising, bleeding, and enlarged lymph nodes. Neuro:  Denies any headaches, dizziness, paresthesias. Endo:  Denies any problems with DM, thyroid, adrenal function    Prior Endoscopies:  See history of present illness  Physical Exam: BP 100/64 mmHg  Pulse 72  Ht 6\' 3"  (1.905 m)  Wt 214 lb (97.07 kg)  BMI 26.75 kg/m2 Constitutional: Pleasant,well-developed, male in no acute distress. HEENT: Normocephalic and atraumatic. Conjunctivae are normal. No scleral icterus. Neck supple. No cervical adenopathy Cardiovascular: Normal rate, regular rhythm.  Pulmonary/chest: Effort normal and breath sounds normal. No wheezing, rales or rhonchi. Abdominal: Soft, nondistended, nontender. Bowel sounds active throughout. There are no masses palpable. No hepatomegaly. Extremities: no edema Lymphadenopathy: No cervical adenopathy noted. Neurological: Alert and oriented to person place and time. Skin: Skin is warm and dry. No rashes noted. Psychiatric: Normal mood and affect. Behavior is normal.  ASSESSMENT AND PLAN: Asymptomatic 74 year old male here to schedule a colonoscopy to evaluate for polyps or neoplasia.The risks, benefits, and alternatives to colonoscopy with possible biopsy and possible polypectomy were discussed with the patient and they consent to proceed..Will  hold eliquis 2 days prior to endoscopic procedures - will instruct when and how to resume after procedure. Benefits and risks of procedure explained including risks of bleeding, perforation, infection, missed lesions, reactions to medications and possible need for hospitalization and surgery for complications. Additional rare but real risk of stroke or other vascular clotting events off eliquis also explained and need to seek urgent help if any signs of these problems occur. Will communicate by phone or EMR with patient's  prescribing provider to confirm that holding eliquis is reasonable in this case.   Zay Yeargan, Moise Boring 03/01/2015, 11:24 AM  CC: [name], MD

## 2015-03-02 NOTE — Progress Notes (Signed)
i agree with the above note, plan 

## 2015-03-02 NOTE — Telephone Encounter (Signed)
apixoban can be held for 48 hrs

## 2015-03-03 ENCOUNTER — Encounter: Payer: Medicare Other | Admitting: Gastroenterology

## 2015-03-05 NOTE — Telephone Encounter (Signed)
Pt has been notified to hold Eliquis 48 hours prior to procedure.

## 2015-03-12 DIAGNOSIS — L280 Lichen simplex chronicus: Secondary | ICD-10-CM | POA: Diagnosis not present

## 2015-03-19 DIAGNOSIS — N529 Male erectile dysfunction, unspecified: Secondary | ICD-10-CM | POA: Diagnosis not present

## 2015-03-19 DIAGNOSIS — N508 Other specified disorders of male genital organs: Secondary | ICD-10-CM | POA: Diagnosis not present

## 2015-03-29 DIAGNOSIS — H2510 Age-related nuclear cataract, unspecified eye: Secondary | ICD-10-CM | POA: Diagnosis not present

## 2015-04-02 ENCOUNTER — Encounter: Payer: Self-pay | Admitting: Internal Medicine

## 2015-04-06 ENCOUNTER — Ambulatory Visit (AMBULATORY_SURGERY_CENTER): Payer: Medicare Other | Admitting: Gastroenterology

## 2015-04-06 ENCOUNTER — Encounter: Payer: Self-pay | Admitting: Gastroenterology

## 2015-04-06 VITALS — BP 118/75 | HR 63 | Temp 96.8°F | Resp 17 | Ht 75.0 in | Wt 214.0 lb

## 2015-04-06 DIAGNOSIS — K5730 Diverticulosis of large intestine without perforation or abscess without bleeding: Secondary | ICD-10-CM

## 2015-04-06 DIAGNOSIS — Z1211 Encounter for screening for malignant neoplasm of colon: Secondary | ICD-10-CM

## 2015-04-06 DIAGNOSIS — D120 Benign neoplasm of cecum: Secondary | ICD-10-CM | POA: Diagnosis not present

## 2015-04-06 MED ORDER — SODIUM CHLORIDE 0.9 % IV SOLN: 500.0000 mL | INTRAVENOUS | Status: DC

## 2015-04-06 NOTE — Op Note (Signed)
[place] [facility]  [address]. [place], [address]   COLONOSCOPY PROCEDURE REPORT  PATIENT: [name], [name]  MR#: [id number] BIRTHDATE: Sep 07, 1941 , 74  yrs. old GENDER: male ENDOSCOPIST: Milus Banister, MD PROCEDURE DATE:  04/06/2015 PROCEDURE:   Colonoscopy, screening and Colonoscopy with snare polypectomy First Screening Colonoscopy - Avg.  risk and is 50 yrs.  old or older - No.  Prior Negative Screening - Now for repeat screening. 10 or more years since last screening  History of Adenoma - Now for follow-up colonoscopy & has been > or = to 3 yrs.  N/A  Polyps removed today? Yes ASA CLASS:   Class II INDICATIONS:Screening for colonic neoplasia and Colorectal Neoplasm Risk Assessment for this procedure is average risk. MEDICATIONS: Monitored anesthesia care and Propofol 200 mg IV  DESCRIPTION OF PROCEDURE:   After the risks benefits and alternatives of the procedure were thoroughly explained, informed consent was obtained.  The digital rectal exam revealed no abnormalities of the rectum.   The LB [id number]  endoscope was introduced through the anus and advanced to the cecum, which was identified by both the appendix and ileocecal valve. No adverse events experienced.   The quality of the prep was excellent.  The instrument was then slowly withdrawn as the colon was fully examined. Estimated blood loss is zero unless otherwise noted in this procedure report.   COLON FINDINGS: Two sessile polyps ranging between 3-11mm in size were found at the cecum.  Polypectomies were performed with a cold snare.  The resection was complete, the polyp tissue was completely retrieved and sent to histology.   There was mild diverticulosis noted in the left colon.   The examination was otherwise normal. Retroflexed views revealed no abnormalities. The time to cecum = 2.9 Withdrawal time = 8.7   The scope was withdrawn and the procedure completed. COMPLICATIONS: There were  no immediate complications.  ENDOSCOPIC IMPRESSION: 1.   Two sessile polyps ranging between 3-49mm in size were found at the cecum; polypectomies were performed with a cold snare 2.   Mild diverticulosis was noted in the left colon 3.   The examination was otherwise normal  RECOMMENDATIONS: If the polyp(s) removed today are proven to be adenomatous (pre-cancerous) polyps, you will need a repeat colonoscopy in 5 years.  You will receive a letter within 1-2 weeks with the results of your biopsy as well as final recommendations.  Please call my office if you have not received a letter after 3 weeks.  eSigned:  Milus Banister, MD 04/06/2015 4:20 PM

## 2015-04-06 NOTE — Progress Notes (Signed)
A/ox3 pleased with MAC, report to Tracy RN 

## 2015-04-06 NOTE — Progress Notes (Signed)
Called to room to assist during endoscopic procedure.  Patient ID and intended procedure confirmed with present staff. Received instructions for my participation in the procedure from the performing physician.  

## 2015-04-06 NOTE — Patient Instructions (Signed)
Impressions/recommendations:  Polyps (handout given) Diverticulosis (handout given) High Fiber diet (handout given)  Repeat colonoscopy pending pathology results.  Resume Eliquis today.  YOU HAD AN ENDOSCOPIC PROCEDURE TODAY AT [facility]:   Refer to the procedure report that was given to you for any specific questions about what was found during the examination.  If the procedure report does not answer your questions, please call your gastroenterologist to clarify.  If you requested that your care partner not be given the details of your procedure findings, then the procedure report has been included in a sealed envelope for you to review at your convenience later.  YOU SHOULD EXPECT: Some feelings of bloating in the abdomen. Passage of more gas than usual.  Walking can help get rid of the air that was put into your GI tract during the procedure and reduce the bloating. If you had a lower endoscopy (such as a colonoscopy or flexible sigmoidoscopy) you may notice spotting of blood in your stool or on the toilet paper. If you underwent a bowel prep for your procedure, you may not have a normal bowel movement for a few days.  Please Note:  You might notice some irritation and congestion in your nose or some drainage.  This is from the oxygen used during your procedure.  There is no need for concern and it should clear up in a day or so.  SYMPTOMS TO REPORT IMMEDIATELY:   Following lower endoscopy (colonoscopy or flexible sigmoidoscopy):  Excessive amounts of blood in the stool  Significant tenderness or worsening of abdominal pains  Swelling of the abdomen that is new, acute  Fever of 100F or higher  For urgent or emergent issues, a gastroenterologist can be reached at any hour by calling [phone].   DIET: Your first meal following the procedure should be a small meal and then it is ok to progress to your normal diet. Heavy or fried foods are harder to digest and  may make you feel nauseous or bloated.  Likewise, meals heavy in dairy and vegetables can increase bloating.  Drink plenty of fluids but you should avoid alcoholic beverages for 24 hours.  ACTIVITY:  You should plan to take it easy for the rest of today and you should NOT DRIVE or use heavy machinery until tomorrow (because of the sedation medicines used during the test).    FOLLOW UP: Our staff will call the number listed on your records the next business day following your procedure to check on you and address any questions or concerns that you may have regarding the information given to you following your procedure. If we do not reach you, we will leave a message.  However, if you are feeling well and you are not experiencing any problems, there is no need to return our call.  We will assume that you have returned to your regular daily activities without incident.  If any biopsies were taken you will be contacted by phone or by letter within the next 1-3 weeks.  Please call us at [phone] if you have not heard about the biopsies in 3 weeks.    SIGNATURES/CONFIDENTIALITY: You and/or your care partner have signed paperwork which will be entered into your electronic medical record.  These signatures attest to the fact that that the information above on your After Visit Summary has been reviewed and is understood.  Full responsibility of the confidentiality of this discharge information lies with you and/or your care-partner.

## 2015-04-07 ENCOUNTER — Ambulatory Visit (INDEPENDENT_AMBULATORY_CARE_PROVIDER_SITE_OTHER): Payer: Medicare Other | Admitting: Internal Medicine

## 2015-04-07 ENCOUNTER — Encounter: Payer: Self-pay | Admitting: Internal Medicine

## 2015-04-07 ENCOUNTER — Other Ambulatory Visit (INDEPENDENT_AMBULATORY_CARE_PROVIDER_SITE_OTHER): Payer: Medicare Other

## 2015-04-07 ENCOUNTER — Telehealth: Payer: Self-pay | Admitting: *Deleted

## 2015-04-07 VITALS — BP 126/66 | HR 67 | Temp 98.0°F | Wt 212.0 lb

## 2015-04-07 DIAGNOSIS — E785 Hyperlipidemia, unspecified: Secondary | ICD-10-CM | POA: Diagnosis not present

## 2015-04-07 DIAGNOSIS — Z0000 Encounter for general adult medical examination without abnormal findings: Secondary | ICD-10-CM

## 2015-04-07 DIAGNOSIS — Z23 Encounter for immunization: Secondary | ICD-10-CM | POA: Diagnosis not present

## 2015-04-07 DIAGNOSIS — G4700 Insomnia, unspecified: Secondary | ICD-10-CM | POA: Diagnosis not present

## 2015-04-07 LAB — CBC WITH DIFFERENTIAL/PLATELET
BASOS ABS: 0 10*3/uL (ref 0.0–0.1)
BASOS PCT: 0.4 % (ref 0.0–3.0)
EOS ABS: 0.2 10*3/uL (ref 0.0–0.7)
Eosinophils Relative: 2.5 % (ref 0.0–5.0)
HCT: 44.4 % (ref 39.0–52.0)
Hemoglobin: 15.3 g/dL (ref 13.0–17.0)
LYMPHS ABS: 1.3 10*3/uL (ref 0.7–4.0)
Lymphocytes Relative: 15.5 % (ref 12.0–46.0)
MCHC: 34.5 g/dL (ref 30.0–36.0)
MCV: 94.9 fl (ref 78.0–100.0)
Monocytes Absolute: 0.7 10*3/uL (ref 0.1–1.0)
Monocytes Relative: 7.8 % (ref 3.0–12.0)
Neutro Abs: 6.2 10*3/uL (ref 1.4–7.7)
Neutrophils Relative %: 73.8 % (ref 43.0–77.0)
PLATELETS: 176 10*3/uL (ref 150.0–400.0)
RBC: 4.68 Mil/uL (ref 4.22–5.81)
RDW: 14 % (ref 11.5–15.5)
WBC: 8.4 10*3/uL (ref 4.0–10.5)

## 2015-04-07 LAB — TSH: TSH: 1.53 u[IU]/mL (ref 0.35–4.50)

## 2015-04-07 LAB — HEPATIC FUNCTION PANEL
ALBUMIN: 4.3 g/dL (ref 3.5–5.2)
ALT: 21 U/L (ref 0–53)
AST: 28 U/L (ref 0–37)
Alkaline Phosphatase: 54 U/L (ref 39–117)
BILIRUBIN DIRECT: 0.3 mg/dL (ref 0.0–0.3)
Total Bilirubin: 1 mg/dL (ref 0.2–1.2)
Total Protein: 6.7 g/dL (ref 6.0–8.3)

## 2015-04-07 LAB — URINALYSIS, ROUTINE W REFLEX MICROSCOPIC
BILIRUBIN URINE: NEGATIVE
Ketones, ur: NEGATIVE
Leukocytes, UA: NEGATIVE
Nitrite: NEGATIVE
PH: 6 (ref 5.0–8.0)
RBC / HPF: NONE SEEN (ref 0–?)
Specific Gravity, Urine: 1.02 (ref 1.000–1.030)
Total Protein, Urine: NEGATIVE
URINE GLUCOSE: NEGATIVE
Urobilinogen, UA: 0.2 (ref 0.0–1.0)
WBC UA: NONE SEEN (ref 0–?)

## 2015-04-07 LAB — BASIC METABOLIC PANEL
BUN: 14 mg/dL (ref 6–23)
CO2: 31 meq/L (ref 19–32)
Calcium: 9.9 mg/dL (ref 8.4–10.5)
Chloride: 108 mEq/L (ref 96–112)
Creatinine, Ser: 1.23 mg/dL (ref 0.40–1.50)
GFR: 60.88 mL/min (ref >60.00)
Glucose, Bld: 92 mg/dL (ref 70–99)
Potassium: 4.2 mEq/L (ref 3.5–5.1)
SODIUM: 143 meq/L (ref 135–145)

## 2015-04-07 LAB — LIPID PANEL
CHOL/HDL RATIO: 3
Cholesterol: 118 mg/dL (ref 0–200)
HDL: 43.8 mg/dL (ref >39.00)
LDL Cholesterol: 64 mg/dL (ref 0–99)
NONHDL: 74.2
Triglycerides: 51 mg/dL (ref 0.0–149.0)
VLDL: 10.2 mg/dL (ref 0.0–40.0)

## 2015-04-07 LAB — PSA: PSA: 0.44 ng/mL (ref 0.10–4.00)

## 2015-04-07 MED ORDER — ZOLPIDEM TARTRATE 10 MG PO TABS: 2.5000 mg | ORAL_TABLET | Freq: Every evening | ORAL | Status: DC | PRN

## 2015-04-07 NOTE — Progress Notes (Signed)
Subjective:    Patient ID: [name], male    DOB: 1941-07-13, 74 y.o.   MRN: [id number]  HPI  Here for wellness and f/u;  Overall doing ok;  Pt denies Chest pain, worsening SOB, DOE, wheezing, orthopnea, PND, worsening LE edema, palpitations, dizziness or syncope.  Pt denies neurological change such as new headache, facial or extremity weakness.  Pt denies polydipsia, polyuria, or low sugar symptoms. Pt states overall good compliance with treatment and medications, good tolerability, and has been trying to follow appropriate diet.  Pt denies worsening depressive symptoms, suicidal ideation or panic. No fever, night sweats, wt loss, loss of appetite, or other constitutional symptoms.  Pt states good ability with ADL's, has low fall risk, home safety reviewed and adequate, no other significant changes in hearing or vision, and only occasionally active with exercise.  Asks for [employer] with persistent sleep onset problem.  Sees Dr Latanya Maudlin for bilat hip and lbp.  Pt continues to have recurring left LBP without change in severity, bowel or bladder change, fever, wt loss,  worsening LE pain/numbness/weakness, gait change or falls. Sees Dr Nira Conn for BPH and ED but has not been getting psa there per pt Past Medical History  Diagnosis Date  . Hyperlipidemia     takes Tricor and Lipitor daily  . Coronary artery disease     LAD stenting 2004 non-DES  . Atrial fibrillation     takes Tikosyn and Eliquis daily  . Peripheral vascular disease   . Allergic rhinitis     takes Allegra daily  . Arthritis   . Bilateral popliteal artery aneurysm   . Pacemaker   . Allergic rhinitis, cause unspecified 06/22/2012  . Insomnia     takes Ambien nightly as needed  . OSA (obstructive sleep apnea)     CPAP  . Pleurisy     early 55's  . 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 Joint pain   . Joint swelling   . Dysphagia     occasionally  . History of colon polyps   . Diverticulosis    . Urinary urgency   . Depression     took Zoloft 31yrs ago but nothing now  . History of blood clots 2003    left leg prior to fem pop  . DVT (deep venous thrombosis)    Past Surgical History  Procedure Laterality Date  . Inguinal hernia repair Right   . Stent (unspec)      x2  . Pacemaker insertion      due to bradycardia  . Right and left fem-pop bypass    . Hand surgery Left   . Left knee surgery    . Cardiac catheterization  2004  . Hand and arm surgery Right     as a teenager  . Turbinate reduction    . Wisdom extracted     . Colonoscopy    . Total hip arthroplasty Left 12/09/2013    Procedure: TOTAL HIP ARTHROPLASTY ANTERIOR APPROACH;  Surgeon: Hessie Dibble, MD;  Location: [facility];  Service: Orthopedics;  Laterality: Left;  left anterior total hip arthroplasty  . Joint replacement Left December 09, 2013    Left Hip   . Joint replacement Right Aug. 25, 2015    Right Hip  . Total hip arthroplasty Right 05/05/2014    Procedure: TOTAL HIP ARTHROPLASTY ANTERIOR APPROACH;  Surgeon: Hessie Dibble, MD;  Location: [facility];  Service:  Orthopedics;  Laterality: Right;  . Permanent pacemaker generator change N/A 06/17/2012    Procedure: PERMANENT PACEMAKER GENERATOR CHANGE;  Surgeon: Deboraha Sprang, MD;  Location: [facility] CATH LAB;  Service: Cardiovascular;  Laterality: N/A;    reports that he has never smoked. He has never used smokeless tobacco. He reports that he does not drink alcohol or use illicit drugs. family history includes Aneurysm in his father; Diabetes in his mother and other; Heart attack in his father; Heart disease in his father and mother; Peripheral vascular disease in his father; Sleep apnea in his other; Stroke in his father. Allergies  Allergen Reactions  . Amiodarone     REACTION: peripheral neuropathy  . Niacin     REACTION: irregular heartbeat  . Ace Inhibitors     REACTION: cough  . Mometasone Furoate Other (See Comments)    (Nasonex) Causes nasal bleeding  and nose bleeds   Current Outpatient Prescriptions on File Prior to Visit  Medication Sig Dispense Refill  . apixaban (ELIQUIS) 2.5 MG TABS tablet Take 2.5 mg by mouth 2 (two) times daily.    Marland Kitchen atorvastatin (LIPITOR) 80 MG tablet Take one-half tablet by  mouth daily 45 tablet 3  . CALCIUM PO Take 600 mg by mouth 2 (two) times daily.    . Cyanocobalamin (VITAMIN B 12 PO) Take 1 tablet by mouth daily.    Marland Kitchen DILT-XR 120 MG 24 hr capsule Take 1 capsule by mouth  daily 90 capsule 3  . fenofibrate (TRICOR) 145 MG tablet Take 1 tablet by mouth  daily 90 tablet 3  . fexofenadine (ALLEGRA) 180 MG tablet Take 180 mg by mouth daily.    Marland Kitchen FOLIC ACID PO Take 1 tablet by mouth daily.    Marland Kitchen GLUCOSAMINE CHONDROITIN COMPLX PO Take 1 tablet by mouth 2 (two) times daily.     Marland Kitchen Lysine 500 MG TABS Take 500 mg by mouth 3 (three) times daily.    . Magnesium 250 MG TABS Take 250 mg by mouth 2 (two) times daily.     . Omega-3 Fatty Acids (FISH OIL) 1200 MG CAPS Take 1,200 mg by mouth daily.     . potassium chloride (KLOR-CON M10) 10 MEQ tablet Take 1 tablet (10 mEq total) by mouth 2 (two) times daily. 180 tablet 1  . tadalafil (CIALIS) 20 MG tablet Take 1 tablet (20 mg total) by mouth daily as needed. For ED 30 tablet 3  . TIKOSYN 500 MCG capsule Take 1 capsule by mouth  twice daily 180 capsule 3  . triamcinolone (NASACORT) 55 MCG/ACT AERO nasal inhaler Place 2 sprays into both nostrils daily.     Marland Kitchen zolpidem (AMBIEN) 10 MG tablet Take 2.5-5 mg by mouth at bedtime as needed for sleep.      No current facility-administered medications on file prior to visit.     Review of Systems Constitutional: Negative for increased diaphoresis, other activity, appetite or siginficant weight change other than noted HENT: Negative for worsening hearing loss, ear pain, facial swelling, mouth sores and neck stiffness.   Eyes: Negative for other worsening pain, redness or visual disturbance.  Respiratory: Negative for shortness of  breath and wheezing  Cardiovascular: Negative for chest pain and palpitations.  Gastrointestinal: Negative for diarrhea, blood in stool, abdominal distention or other pain Genitourinary: Negative for hematuria, flank pain or change in urine volume.  Musculoskeletal: Negative for myalgias or other joint complaints.  Skin: Negative for color change and wound or drainage.  Neurological: Negative  for syncope and numbness. other than noted Hematological: Negative for adenopathy. or other swelling Psychiatric/Behavioral: Negative for hallucinations, SI, self-injury, decreased concentration or other worsening agitation.      Objective:   Physical Exam BP 126/66 mmHg  Pulse 67  Temp(Src) 98 F (36.7 C)  Wt 212 lb (96.163 kg)  SpO2 98% VS noted,  Constitutional: Pt is oriented to person, place, and time. Appears well-developed and well-nourished, in no significant distress Head: Normocephalic and atraumatic.  Right Ear: External ear normal.  Left Ear: External ear normal.  Nose: Nose normal.  Mouth/Throat: Oropharynx is clear and moist.  Eyes: Conjunctivae and EOM are normal. Pupils are equal, round, and reactive to light.  Neck: Normal range of motion. Neck supple. No JVD present. No tracheal deviation present or significant neck LA or mass Cardiovascular: Normal rate, regular rhythm, normal heart sounds and intact distal pulses.   Pulmonary/Chest: Effort normal and breath sounds without rales or wheezing  Abdominal: Soft. Bowel sounds are normal. NT. No HSM  Musculoskeletal: Normal range of motion. Exhibits no edema.  Lymphadenopathy:  Has no cervical adenopathy.  Neurological: Pt is alert and oriented to person, place, and time. Pt has normal reflexes. No cranial nerve deficit. Motor grossly intact Skin: Skin is warm and dry. No rash noted.  Psychiatric:  Has normal mood and affect. Behavior is normal.     Assessment & Plan:

## 2015-04-07 NOTE — Assessment & Plan Note (Signed)

## 2015-04-07 NOTE — Progress Notes (Signed)
Pre visit review using our clinic review tool, if applicable. No additional management support is needed unless otherwise documented below in the visit note. 

## 2015-04-07 NOTE — Assessment & Plan Note (Signed)
Ok for ambien refill,  to f/u any worsening symptoms or concerns 

## 2015-04-07 NOTE — Addendum Note (Signed)
Addended by: Levonne Lapping on: 04/07/2015 11:33 AM   Modules accepted: Orders

## 2015-04-07 NOTE — Telephone Encounter (Signed)
  Follow up Call-  Call back number 04/06/2015  Post procedure Call Back phone  # [phone]  Permission to leave phone message Yes     Patient questions:  Do you have a fever, pain , or abdominal swelling? No. Pain Score  0 *  Have you tolerated food without any problems? Yes.    Have you been able to return to your normal activities? Yes.    Do you have any questions about your discharge instructions: Diet   No. Medications  No. Follow up visit  No.  Do you have questions or concerns about your Care? No.  Actions: * If pain score is 4 or above: No action needed, pain <4.

## 2015-04-07 NOTE — Patient Instructions (Addendum)
You had the new Prevnar pneumonia shot today  Please continue all other medications as before, and refills have been done if requested.  Please have the pharmacy call with any other refills you may need.  Please continue your efforts at being more active, low cholesterol diet, and weight control.  You are otherwise up to date with prevention measures today.  Please keep your appointments with your specialists as you may have planned  Please go to the LAB in the Basement (turn left off the elevator) for the tests to be done today  You will be contacted by phone if any changes need to be made immediately.  Otherwise, you will receive a letter about your results with an explanation, but please check with MyChart first.  Please remember to sign up for MyChart if you have not done so, as this will be important to you in the future with finding out test results, communicating by private email, and scheduling acute appointments online when needed.  Please return in 1 year for your yearly visit, or sooner if needed, with Lab testing done 3-5 days before

## 2015-04-08 ENCOUNTER — Other Ambulatory Visit: Payer: Self-pay | Admitting: *Deleted

## 2015-04-08 ENCOUNTER — Encounter: Payer: Self-pay | Admitting: Internal Medicine

## 2015-04-08 MED ORDER — APIXABAN 2.5 MG PO TABS: 2.5000 mg | ORAL_TABLET | Freq: Two times a day (BID) | ORAL | Status: DC

## 2015-04-08 MED ORDER — APIXABAN 5 MG PO TABS: 5.0000 mg | ORAL_TABLET | Freq: Two times a day (BID) | ORAL | Status: DC

## 2015-04-13 ENCOUNTER — Encounter: Payer: Self-pay | Admitting: Gastroenterology

## 2015-04-20 ENCOUNTER — Other Ambulatory Visit: Payer: Self-pay

## 2015-04-22 ENCOUNTER — Encounter: Payer: Self-pay | Admitting: Internal Medicine

## 2015-04-22 ENCOUNTER — Ambulatory Visit (INDEPENDENT_AMBULATORY_CARE_PROVIDER_SITE_OTHER): Payer: Medicare Other | Admitting: Internal Medicine

## 2015-04-22 VITALS — BP 122/66 | HR 71 | Ht 75.0 in | Wt 211.0 lb

## 2015-04-22 DIAGNOSIS — I4891 Unspecified atrial fibrillation: Secondary | ICD-10-CM | POA: Diagnosis not present

## 2015-04-22 DIAGNOSIS — I495 Sick sinus syndrome: Secondary | ICD-10-CM | POA: Diagnosis not present

## 2015-04-22 DIAGNOSIS — Z950 Presence of cardiac pacemaker: Secondary | ICD-10-CM | POA: Diagnosis not present

## 2015-04-22 DIAGNOSIS — Z45018 Encounter for adjustment and management of other part of cardiac pacemaker: Secondary | ICD-10-CM | POA: Diagnosis not present

## 2015-04-22 DIAGNOSIS — I480 Paroxysmal atrial fibrillation: Secondary | ICD-10-CM | POA: Diagnosis not present

## 2015-04-22 LAB — CUP PACEART INCLINIC DEVICE CHECK
Brady Statistic RA Percent Paced: 52 %
Date Time Interrogation Session: 20160811174602
Lead Channel Pacing Threshold Amplitude: 0.7 V
Lead Channel Pacing Threshold Amplitude: 0.8 V
Lead Channel Sensing Intrinsic Amplitude: 2.4 mV
Lead Channel Sensing Intrinsic Amplitude: 2.9 mV
Lead Channel Setting Pacing Pulse Width: 0.4 ms
MDC IDC MSMT LEADCHNL RA IMPEDANCE VALUE: 331 Ohm
MDC IDC MSMT LEADCHNL RA PACING THRESHOLD PULSEWIDTH: 0.4 ms
MDC IDC MSMT LEADCHNL RV IMPEDANCE VALUE: 331 Ohm
MDC IDC MSMT LEADCHNL RV PACING THRESHOLD PULSEWIDTH: 0.4 ms
MDC IDC PG SERIAL: 66339555
MDC IDC SET LEADCHNL RA PACING AMPLITUDE: 1.7 V
MDC IDC SET LEADCHNL RV PACING AMPLITUDE: 2 V
MDC IDC STAT BRADY RV PERCENT PACED: 5 %

## 2015-04-22 NOTE — Patient Instructions (Addendum)
Medication Instructions:  Your physician recommends that you continue on your current medications as directed. Please refer to the Current Medication list given to you today.  Labwork: None ordered  Testing/Procedure: Your physician has requested that you have an echocardiogram. Echocardiography is a painless test that uses sound waves to create images of your heart. It provides your doctor with information about the size and shape of your heart and how well your heart's chambers and valves are working. This procedure takes approximately one hour. There are no restrictions for this procedure.  Follow-Up: Your physician wants you to follow-up in: 6 months with device clinic.  You will receive a reminder letter in the mail two months in advance. If you don't receive a letter, please call our office to schedule the follow-up appointment.  Your physician wants you to follow-up in: 1 year with Dr. Caryl Comes.  You will receive a reminder letter in the mail two months in advance. If you don't receive a letter, please call our office to schedule the follow-up appointment.  Any Other Special Instructions Will Be Listed Below (If Applicable). Thank you for choosing [facility]!!

## 2015-04-22 NOTE — Progress Notes (Signed)
Patient Care Team: Biagio Borg, MD as PCP - General (Internal Medicine) Deboraha Sprang, MD (Cardiology) Melrose Nakayama, MD as Consulting Physician (Orthopedic Surgery)   HPI  [name] is a 74 y.o. 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   His pacemaker reached ERI 8/13 and he underwent generator replacement with a Biotronik device because of his CLS function  He takes dofetilide for his atrial fibrillation  Potassium levels were normal to weeks ago. Magnesium levels will be checked.  The patient denies chest pain, shortness of breath, nocturnal dyspnea, orthopnea or peripheral edema.  There have been scant  Palpitations, but no lightheadedness or syncope.   He does have a dryness his cough which dates back about 30 years. He is quite distinct from his PVCs.     Past Medical History  Diagnosis Date  . Hyperlipidemia     takes Tricor and Lipitor daily  . Coronary artery disease     LAD stenting 2004 non-DES  . Atrial fibrillation     takes Tikosyn and Eliquis daily  . Peripheral vascular disease   . Allergic rhinitis     takes Allegra daily  . Arthritis   . Bilateral popliteal artery aneurysm   . Pacemaker   . Allergic rhinitis, cause unspecified 06/22/2012  . Insomnia     takes Ambien nightly as needed  . OSA (obstructive sleep apnea)     CPAP  . Pleurisy     early 80's  . 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 Joint pain   . Joint swelling   . Dysphagia     occasionally  . History of colon polyps   . Diverticulosis   . Urinary urgency   . Depression     took Zoloft 7yrs ago but nothing now  . History of blood clots 2003    left leg prior to fem pop  . DVT (deep venous thrombosis)     Past Surgical History  Procedure Laterality Date  . Inguinal hernia repair Right   . Stent (unspec)      x2  .  Pacemaker insertion      due to bradycardia  . Right and left fem-pop bypass    . Hand surgery Left   . Left knee surgery    . Cardiac catheterization  2004  . Hand and arm surgery Right     as a teenager  . Turbinate reduction    . Wisdom extracted     . Colonoscopy    . Total hip arthroplasty Left 12/09/2013    Procedure: TOTAL HIP ARTHROPLASTY ANTERIOR APPROACH;  Surgeon: Hessie Dibble, MD;  Location: [facility];  Service: Orthopedics;  Laterality: Left;  left anterior total hip arthroplasty  . Joint replacement Left December 09, 2013    Left Hip   . Joint replacement Right Aug. 25, 2015    Right Hip  . Total hip arthroplasty Right 05/05/2014    Procedure: TOTAL HIP ARTHROPLASTY ANTERIOR APPROACH;  Surgeon: Hessie Dibble, MD;  Location: [facility];  Service: Orthopedics;  Laterality: Right;  . Permanent pacemaker generator change N/A 06/17/2012    Procedure: PERMANENT PACEMAKER GENERATOR CHANGE;  Surgeon: Deboraha Sprang, MD;  Location: [facility] CATH LAB;  Service: Cardiovascular;  Laterality: N/A;    Current Outpatient Prescriptions  Medication Sig Dispense  Refill  . apixaban (ELIQUIS) 5 MG TABS tablet Take 1 tablet (5 mg total) by mouth 2 (two) times daily. 180 tablet 2  . atorvastatin (LIPITOR) 80 MG tablet Take one-half tablet by  mouth daily 45 tablet 3  . CALCIUM PO Take 600 mg by mouth 2 (two) times daily.    . Cyanocobalamin (VITAMIN B 12 PO) Take 1 tablet by mouth daily.    Marland Kitchen DILT-XR 120 MG 24 hr capsule Take 1 capsule by mouth  daily 90 capsule 3  . fenofibrate (TRICOR) 145 MG tablet Take 1 tablet by mouth  daily 90 tablet 3  . fexofenadine (ALLEGRA) 180 MG tablet Take 180 mg by mouth daily.    Marland Kitchen FOLIC ACID PO Take 1 tablet by mouth daily.    Marland Kitchen GLUCOSAMINE CHONDROITIN COMPLX PO Take 1 tablet by mouth 2 (two) times daily.     Marland Kitchen Lysine 500 MG TABS Take 500 mg by mouth 3 (three) times daily.    . Magnesium 250 MG TABS Take 250 mg by mouth 2 (two) times daily.     . Omega-3 Fatty Acids  (FISH OIL) 1200 MG CAPS Take 1,200 mg by mouth daily.     . potassium chloride (KLOR-CON M10) 10 MEQ tablet Take 1 tablet (10 mEq total) by mouth 2 (two) times daily. 180 tablet 1  . tadalafil (CIALIS) 20 MG tablet Take 1 tablet (20 mg total) by mouth daily as needed. For ED 30 tablet 3  . TIKOSYN 500 MCG capsule Take 1 capsule by mouth  twice daily 180 capsule 3  . triamcinolone (NASACORT) 55 MCG/ACT AERO nasal inhaler Place 2 sprays into both nostrils daily.     Marland Kitchen zolpidem (AMBIEN) 10 MG tablet Take 0.5 tablets (5 mg total) by mouth at bedtime as needed for sleep. 90 tablet 1   No current facility-administered medications for this visit.    Allergies  Allergen Reactions  . Amiodarone     REACTION: peripheral neuropathy  . Niacin     REACTION: irregular heartbeat  . Ace Inhibitors     REACTION: cough  . Mometasone Furoate Other (See Comments)    (Nasonex) Causes nasal bleeding and nose bleeds    Review of Systems negative except from HPI and PMH  Physical Exam BP 122/66 mmHg  Pulse 71  Ht 6\' 3"  (1.905 m)  Wt 211 lb (95.709 kg)  BMI 26.37 kg/m2 Well developed and well nourished in no acute distress HENT normal E scleral and icterus clear Neck Supple JVP flat; carotids brisk and full Clear to ausculation  *Regular rate and rhythm, no murmurs gallops or rub Soft with active bowel sounds No clubbing cyanosis non-pitting and none Edema Alert and oriented, grossly normal motor and sensory function Skin Warm and Dry  ECG demonstrates atrial paced at 71 Intervals 14/13/46 Left bundle branch block   Assessment and  Plan  Atrial fibrillation-paroxysmal  Symptomatic ventricular pacing  Pacemaker Biotronik The patient's device was interrogated and the information was fully reviewed.  The device was reprogrammed as above  LBBB  Atrial fibrillation burden is stable. He is back on full dose apixaban.  Symptomatic ventricular pacing is much relieved by reprogramming  accomplished last time.  He has developed new left bundle branch block;  while there are no symptoms to suggest cardiomyopathy; we will undertake an echo to clarify. We had a lengthy discussion regarding the implications of left bundle branch block  We spent more than 50% of our >45 min visit  in face to face counseling regarding the above

## 2015-04-26 ENCOUNTER — Other Ambulatory Visit (HOSPITAL_COMMUNITY): Payer: Medicare Other

## 2015-04-27 ENCOUNTER — Ambulatory Visit (HOSPITAL_COMMUNITY): Payer: Medicare Other | Attending: Cardiovascular Disease

## 2015-04-27 ENCOUNTER — Other Ambulatory Visit: Payer: Self-pay

## 2015-04-27 DIAGNOSIS — I340 Nonrheumatic mitral (valve) insufficiency: Secondary | ICD-10-CM | POA: Insufficient documentation

## 2015-04-27 DIAGNOSIS — I358 Other nonrheumatic aortic valve disorders: Secondary | ICD-10-CM | POA: Diagnosis not present

## 2015-04-27 DIAGNOSIS — I517 Cardiomegaly: Secondary | ICD-10-CM | POA: Diagnosis not present

## 2015-04-27 DIAGNOSIS — E785 Hyperlipidemia, unspecified: Secondary | ICD-10-CM | POA: Insufficient documentation

## 2015-04-27 DIAGNOSIS — I480 Paroxysmal atrial fibrillation: Secondary | ICD-10-CM | POA: Diagnosis not present

## 2015-04-27 DIAGNOSIS — I4891 Unspecified atrial fibrillation: Secondary | ICD-10-CM | POA: Diagnosis present

## 2015-04-29 ENCOUNTER — Encounter: Payer: Self-pay | Admitting: Family

## 2015-04-30 ENCOUNTER — Telehealth: Payer: Self-pay | Admitting: *Deleted

## 2015-04-30 ENCOUNTER — Ambulatory Visit (INDEPENDENT_AMBULATORY_CARE_PROVIDER_SITE_OTHER): Payer: Medicare Other | Admitting: Family

## 2015-04-30 ENCOUNTER — Ambulatory Visit (HOSPITAL_COMMUNITY)
Admission: RE | Admit: 2015-04-30 | Discharge: 2015-04-30 | Disposition: A | Discharge status: Home / Self Care | Payer: Medicare Other | Source: Ambulatory Visit | Attending: Family | Admitting: Family

## 2015-04-30 ENCOUNTER — Other Ambulatory Visit: Payer: Self-pay | Admitting: Family

## 2015-04-30 ENCOUNTER — Other Ambulatory Visit: Payer: Self-pay | Admitting: Vascular Surgery

## 2015-04-30 ENCOUNTER — Encounter: Payer: Self-pay | Admitting: Family

## 2015-04-30 ENCOUNTER — Ambulatory Visit (INDEPENDENT_AMBULATORY_CARE_PROVIDER_SITE_OTHER)
Admission: RE | Admit: 2015-04-30 | Discharge: 2015-04-30 | Disposition: A | Discharge status: Home / Self Care | Payer: Medicare Other | Source: Ambulatory Visit | Attending: Family | Admitting: Family

## 2015-04-30 VITALS — BP 126/74 | HR 82 | Temp 97.1°F | Resp 16 | Ht 75.0 in | Wt 210.0 lb

## 2015-04-30 DIAGNOSIS — Z95828 Presence of other vascular implants and grafts: Secondary | ICD-10-CM

## 2015-04-30 DIAGNOSIS — Z9889 Other specified postprocedural states: Secondary | ICD-10-CM

## 2015-04-30 DIAGNOSIS — Z48812 Encounter for surgical aftercare following surgery on the circulatory system: Secondary | ICD-10-CM | POA: Insufficient documentation

## 2015-04-30 DIAGNOSIS — Z01812 Encounter for preprocedural laboratory examination: Secondary | ICD-10-CM

## 2015-04-30 DIAGNOSIS — I739 Peripheral vascular disease, unspecified: Secondary | ICD-10-CM

## 2015-04-30 DIAGNOSIS — R931 Abnormal findings on diagnostic imaging of heart and coronary circulation: Secondary | ICD-10-CM

## 2015-04-30 DIAGNOSIS — I724 Aneurysm of artery of lower extremity: Secondary | ICD-10-CM

## 2015-04-30 NOTE — Telephone Encounter (Signed)
Scheduled patient for cardiac cath secondary to abnormal echo results/depressed LV function. Cath arranged for 8/25.  Pre procedure labs 8/22. Reviewed instructions with patient.  Advised NPO after MN night before.  Advised to take last dose of Eliquis on the morning of 8/23. Patient verbalized understanding and agreeable to plan.

## 2015-04-30 NOTE — Progress Notes (Signed)
[facility] [place] HISTORY AND PHYSICAL -PAD  History of Present Illness [name] is a 74 y.o. male patient of Dr. Kellie Simmering, then Dr. 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 He returns today for routine follow up. He denies claudication symptoms in his legs with walking, denies non healing wounds. He saw a dermatologist for his light lower leg rash, who treated him with a topical corticosteroid which resolved the rash. Today the left lower leg is swollen and slightly red. He had both hips replaced at [facility] in 2015. He has a pacemaker, history of atrial fib for which he also takes Eliquis. His father had a dissecting AAA, he had a sleeve surgical repair, he was a heavy smoker, did not have DM, was obese, per pt. Pt had a normal AAA Duplex in 2013. Pt denies back or abdominal pain. Pt denies any history of stroke or TIA. Pt c/o left hand as more cold than right intermittently, denies tingling, numbness, or pain. He states that he may have a picnched nerve in his c-spine. He states his EF is 30% and he will have a cardiac cath next week, Dr. Caryl Comes.  He is more tired over the last 2-3 months.  Pt Diabetic: No Pt smoker: non-smoker  Pt meds include: Statin :Yes ASA: no Other anticoagulants/antiplatelets: Eliquis     Past Medical History  Diagnosis Date  . Hyperlipidemia     takes Tricor and Lipitor daily  . Coronary artery disease     LAD stenting 2004 non-DES  . Atrial fibrillation     takes Tikosyn and Eliquis daily  . Peripheral vascular disease   . Allergic rhinitis     takes Allegra daily  . Arthritis   . Bilateral popliteal artery aneurysm   . Pacemaker   . Allergic rhinitis, cause unspecified 06/22/2012  . Insomnia     takes Ambien nightly as needed  . OSA (obstructive sleep apnea)     CPAP  . Pleurisy     early 34's   . 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 Joint pain   . Joint swelling   . Dysphagia     occasionally  . History of colon polyps   . Diverticulosis   . Urinary urgency   . Depression     took Zoloft 69yrs ago but nothing now  . History of blood clots 2003    left leg prior to fem pop  . DVT (deep venous thrombosis)     Social History Social History  Substance Use Topics  . Smoking status: Never Smoker   . Smokeless tobacco: Never Used  . Alcohol Use: No     Comment: nothing since 2000    Family History Family History  Problem Relation Age of Onset  . Diabetes Other     family hx  . Sleep apnea Other     family hx  . Diabetes Mother   . Heart disease Mother     Before age 60  . Heart disease Father     Before age 79  . Heart attack Father   . Stroke Father   . Aneurysm Father     bilat pop art aneurysms  . Peripheral vascular disease Father     Popliteal Aneurysm    Past Surgical History  Procedure Laterality Date  . Inguinal hernia repair Right   .  Stent (unspec)      x2  . Pacemaker insertion      due to bradycardia  . Right and left fem-pop bypass    . Hand surgery Left   . Left knee surgery    . Cardiac catheterization  2004  . Hand and arm surgery Right     as a teenager  . Turbinate reduction    . Wisdom extracted     . Colonoscopy    . Total hip arthroplasty Left 12/09/2013    Procedure: TOTAL HIP ARTHROPLASTY ANTERIOR APPROACH;  Surgeon: Hessie Dibble, MD;  Location: [facility];  Service: Orthopedics;  Laterality: Left;  left anterior total hip arthroplasty  . Joint replacement Left December 09, 2013    Left Hip   . Joint replacement Right Aug. 25, 2015    Right Hip  . Total hip arthroplasty Right 05/05/2014    Procedure: TOTAL HIP ARTHROPLASTY ANTERIOR APPROACH;  Surgeon: Hessie Dibble, MD;  Location: [facility];  Service: Orthopedics;  Laterality: Right;  . Permanent pacemaker generator change N/A 06/17/2012    Procedure:  PERMANENT PACEMAKER GENERATOR CHANGE;  Surgeon: Deboraha Sprang, MD;  Location: [facility] CATH LAB;  Service: Cardiovascular;  Laterality: N/A;    Allergies  Allergen Reactions  . Amiodarone     REACTION: peripheral neuropathy  . Niacin     REACTION: irregular heartbeat  . Ace Inhibitors     REACTION: cough  . Mometasone Furoate Other (See Comments)    (Nasonex) Causes nasal bleeding and nose bleeds    Current Outpatient Prescriptions  Medication Sig Dispense Refill  . apixaban (ELIQUIS) 5 MG TABS tablet Take 1 tablet (5 mg total) by mouth 2 (two) times daily. 180 tablet 2  . atorvastatin (LIPITOR) 80 MG tablet Take one-half tablet by  mouth daily 45 tablet 3  . CALCIUM PO Take 600 mg by mouth 2 (two) times daily.    . Cyanocobalamin (VITAMIN B 12 PO) Take 1 tablet by mouth daily.    Marland Kitchen DILT-XR 120 MG 24 hr capsule Take 1 capsule by mouth  daily 90 capsule 3  . fenofibrate (TRICOR) 145 MG tablet Take 1 tablet by mouth  daily 90 tablet 3  . fexofenadine (ALLEGRA) 180 MG tablet Take 180 mg by mouth daily.    Marland Kitchen FOLIC ACID PO Take 1 tablet by mouth daily.    Marland Kitchen GLUCOSAMINE CHONDROITIN COMPLX PO Take 1 tablet by mouth 2 (two) times daily.     Marland Kitchen Lysine 500 MG TABS Take 500 mg by mouth 3 (three) times daily.    . Magnesium 250 MG TABS Take 250 mg by mouth 2 (two) times daily.     . Omega-3 Fatty Acids (FISH OIL) 1200 MG CAPS Take 1,200 mg by mouth daily.     . potassium chloride (KLOR-CON M10) 10 MEQ tablet Take 1 tablet (10 mEq total) by mouth 2 (two) times daily. 180 tablet 1  . tadalafil (CIALIS) 20 MG tablet Take 1 tablet (20 mg total) by mouth daily as needed. For ED 30 tablet 3  . TIKOSYN 500 MCG capsule Take 1 capsule by mouth  twice daily 180 capsule 3  . triamcinolone (NASACORT) 55 MCG/ACT AERO nasal inhaler Place 2 sprays into both nostrils daily.     Marland Kitchen zolpidem (AMBIEN) 10 MG tablet Take 0.5 tablets (5 mg total) by mouth at bedtime as needed for sleep. 90 tablet 1   No current  facility-administered medications for this visit.  ROS: See HPI for pertinent positives and negatives.   Physical Examination  Filed Vitals:   04/30/15 1546  BP: 126/74  Pulse: 82  Temp: 97.1 F (36.2 C)  Resp: 16  Height: 6\' 3"  (1.905 m)  Weight: 210 lb (95.255 kg)  SpO2: 98%   Body mass index is 26.25 kg/(m^2).  General: A&O x 3, WDWN. Gait: normal Eyes: PERRLA. Pulmonary: CTAB, without wheezes , rales or rhonchi. Cardiac: regular Rythm, without detected murmur, pacemaker palpated left upper chest subcutaneously.     Carotid Bruits Right Left   Negative Negative  Aorta is palpable. Radial pulses: are 2+ palpable and =   VASCULAR EXAM: Extremities without ischemic changes  without Gangrene; without open wounds. Mild erythema at mid anterior left tibial area, non tender, 1+ pitting, 2+ non pitting edema in left lower leg only     LE Pulses Right Left   FEMORAL  palpable  palpable    POPLITEAL not palpable  not palpable   POSTERIOR TIBIAL 2+ palpable  2+ palpable    DORSALIS PEDIS  ANTERIOR TIBIAL 1+ palpable  faintly palpable    Abdomen: soft, NT, no palpable masses. Skin: see extremities, no ulcers. Musculoskeletal: no muscle wasting or atrophy. Neurologic: A&O X 3; Appropriate Affect; MOTOR FUNCTION: moving all extremities equally, motor strength 5/5 throughout. Speech is fluent/normal. CN 2-12 intact.          Non-Invasive Vascular Imaging: DATE: 04/30/2015 LOWER EXTREMITY ARTERIAL DUPLEX EVALUATION    INDICATION: Follow-up bilateral lower extremity bypass graft for popliteal artery aneurysm    PREVIOUS INTERVENTION(S): Right femoropopliteal arterial bypass graft 08/17/2002 Left femoropopliteal arterial bypass graft  02/19/2002    DUPLEX EXAM:     RIGHT  LEFT   Peak Systolic Velocity (cm/s) Ratio (if abnormal) Waveform  Peak Systolic Velocity (cm/s) Ratio (if abnormal) Waveform  70  T Inflow Artery 75  T  74  T Proximal Anastomosis 59  T  64  T Proximal Graft 75  T  52  T Mid Graft 59  T  73  T  Distal Graft 64  T  53  T Distal Anastomosis 55  T  66  T Outflow Artery 61  T  1.14 Today's ABI / TBI >1.3  Unreliable Previous ABI / TBI (04/24/2014 ) Unreliable    Waveform:    M - Monophasic       B - Biphasic       T - Triphasic  If Ankle Brachial Index (ABI) or Toe Brachial Index (TBI) performed, please see complete report  ADDITIONAL FINDINGS:     IMPRESSION: Widely patent bilateral lower extremity bypass graft without evidence of restenosis or hyperplasia.     Compared to the previous exam:  No significant change compared to prior exam.      ASSESSMENT: [name] is a 74 y.o. male who is s/p right femoropopliteal arterial bypass graft 08/17/2002 and left femoropopliteal arterial bypass graft 02/19/2002. He has no claudication symptoms with walking, no non healing wounds. His left saphenous vein was harvested in 2003 for the leg bypass graft. Today's bilateral LE arterial Duplex suggests widely patent bilateral lower extremity bypass graft without evidence of restenosis or hyperplasia.  No significant change compared to prior exam a year ago.   PLAN:  Based on the patient's vascular studies and examination, pt will return to clinic in 1 year for ABI's and bilateral LE arterial Duplex.   I discussed in depth with the patient the nature of  atherosclerosis, and emphasized the importance of maximal medical management including strict control of blood pressure, blood glucose, and lipid levels, obtaining regular exercise, and continued cessation of smoking.  The patient is aware that without maximal medical management the underlying atherosclerotic disease process will progress, limiting the  benefit of any interventions.  The patient was given information about PAD including signs, symptoms, treatment, what symptoms should prompt the patient to seek immediate medical care, and risk reduction measures to take.  Clemon Chambers, RN, MSN, FNP-C [facility] Phone: [phone]  Clinic MD: Bridgett Larsson  04/30/2015 4:05 PM

## 2015-05-03 ENCOUNTER — Other Ambulatory Visit (INDEPENDENT_AMBULATORY_CARE_PROVIDER_SITE_OTHER): Payer: Medicare Other

## 2015-05-03 DIAGNOSIS — Z01812 Encounter for preprocedural laboratory examination: Secondary | ICD-10-CM | POA: Diagnosis not present

## 2015-05-03 DIAGNOSIS — R931 Abnormal findings on diagnostic imaging of heart and coronary circulation: Secondary | ICD-10-CM | POA: Diagnosis not present

## 2015-05-03 DIAGNOSIS — Z5181 Encounter for therapeutic drug level monitoring: Secondary | ICD-10-CM

## 2015-05-03 LAB — CBC WITH DIFFERENTIAL/PLATELET
BASOS PCT: 0.7 % (ref 0.0–3.0)
Basophils Absolute: 0 10*3/uL (ref 0.0–0.1)
EOS ABS: 0.3 10*3/uL (ref 0.0–0.7)
EOS PCT: 5.1 % — AB (ref 0.0–5.0)
HEMATOCRIT: 43.3 % (ref 39.0–52.0)
Hemoglobin: 14.4 g/dL (ref 13.0–17.0)
Lymphocytes Relative: 24.2 % (ref 12.0–46.0)
Lymphs Abs: 1.4 10*3/uL (ref 0.7–4.0)
MCHC: 33.2 g/dL (ref 30.0–36.0)
MCV: 96.5 fl (ref 78.0–100.0)
MONO ABS: 0.6 10*3/uL (ref 0.1–1.0)
Monocytes Relative: 10.6 % (ref 3.0–12.0)
NEUTROS ABS: 3.5 10*3/uL (ref 1.4–7.7)
Neutrophils Relative %: 59.4 % (ref 43.0–77.0)
PLATELETS: 191 10*3/uL (ref 150.0–400.0)
RBC: 4.48 Mil/uL (ref 4.22–5.81)
RDW: 14.2 % (ref 11.5–15.5)
WBC: 5.9 10*3/uL (ref 4.0–10.5)

## 2015-05-03 LAB — BASIC METABOLIC PANEL
BUN: 20 mg/dL (ref 6–23)
CHLORIDE: 106 meq/L (ref 96–112)
CO2: 29 meq/L (ref 19–32)
CREATININE: 1.35 mg/dL (ref 0.40–1.50)
Calcium: 10.1 mg/dL (ref 8.4–10.5)
GFR: 54.67 mL/min — ABNORMAL LOW (ref >60.00)
Glucose, Bld: 101 mg/dL — ABNORMAL HIGH (ref 70–99)
POTASSIUM: 4.7 meq/L (ref 3.5–5.1)
Sodium: 140 mEq/L (ref 135–145)

## 2015-05-03 LAB — PROTIME-INR
INR: 1.3 ratio — AB (ref 0.8–1.0)
PROTHROMBIN TIME: 14.6 s — AB (ref 9.6–13.1)

## 2015-05-05 ENCOUNTER — Other Ambulatory Visit: Payer: Self-pay | Admitting: Interventional Cardiology

## 2015-05-05 ENCOUNTER — Encounter: Payer: Self-pay | Admitting: Internal Medicine

## 2015-05-05 DIAGNOSIS — N529 Male erectile dysfunction, unspecified: Secondary | ICD-10-CM | POA: Diagnosis not present

## 2015-05-05 DIAGNOSIS — I5042 Chronic combined systolic (congestive) and diastolic (congestive) heart failure: Secondary | ICD-10-CM

## 2015-05-05 DIAGNOSIS — I5022 Chronic systolic (congestive) heart failure: Secondary | ICD-10-CM

## 2015-05-05 NOTE — H&P (View-Only) (Signed)
Patient Care Team: Biagio Borg, MD as PCP - General (Internal Medicine) Deboraha Sprang, MD (Cardiology) Melrose Nakayama, MD as Consulting Physician (Orthopedic Surgery)   HPI  [name] is a 74 y.o. 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   His pacemaker reached ERI 8/13 and he underwent generator replacement with a Biotronik device because of his CLS function  He takes dofetilide for his atrial fibrillation  Potassium levels were normal to weeks ago. Magnesium levels will be checked.  The patient denies chest pain, shortness of breath, nocturnal dyspnea, orthopnea or peripheral edema.  There have been scant  Palpitations, but no lightheadedness or syncope.   He does have a dryness his cough which dates back about 30 years. He is quite distinct from his PVCs.     Past Medical History  Diagnosis Date  . Hyperlipidemia     takes Tricor and Lipitor daily  . Coronary artery disease     LAD stenting 2004 non-DES  . Atrial fibrillation     takes Tikosyn and Eliquis daily  . Peripheral vascular disease   . Allergic rhinitis     takes Allegra daily  . Arthritis   . Bilateral popliteal artery aneurysm   . Pacemaker   . Allergic rhinitis, cause unspecified 06/22/2012  . Insomnia     takes Ambien nightly as needed  . OSA (obstructive sleep apnea)     CPAP  . Pleurisy     early 56's  . 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 Joint pain   . Joint swelling   . Dysphagia     occasionally  . History of colon polyps   . Diverticulosis   . Urinary urgency   . Depression     took Zoloft 56yrs ago but nothing now  . History of blood clots 2003    left leg prior to fem pop  . DVT (deep venous thrombosis)     Past Surgical History  Procedure Laterality Date  . Inguinal hernia repair Right   . Stent (unspec)      x2  .  Pacemaker insertion      due to bradycardia  . Right and left fem-pop bypass    . Hand surgery Left   . Left knee surgery    . Cardiac catheterization  2004  . Hand and arm surgery Right     as a teenager  . Turbinate reduction    . Wisdom extracted     . Colonoscopy    . Total hip arthroplasty Left 12/09/2013    Procedure: TOTAL HIP ARTHROPLASTY ANTERIOR APPROACH;  Surgeon: Hessie Dibble, MD;  Location: [facility];  Service: Orthopedics;  Laterality: Left;  left anterior total hip arthroplasty  . Joint replacement Left December 09, 2013    Left Hip   . Joint replacement Right Aug. 25, 2015    Right Hip  . Total hip arthroplasty Right 05/05/2014    Procedure: TOTAL HIP ARTHROPLASTY ANTERIOR APPROACH;  Surgeon: Hessie Dibble, MD;  Location: [facility];  Service: Orthopedics;  Laterality: Right;  . Permanent pacemaker generator change N/A 06/17/2012    Procedure: PERMANENT PACEMAKER GENERATOR CHANGE;  Surgeon: Deboraha Sprang, MD;  Location: [facility] CATH LAB;  Service: Cardiovascular;  Laterality: N/A;    Current Outpatient Prescriptions  Medication Sig Dispense  Refill  . apixaban (ELIQUIS) 5 MG TABS tablet Take 1 tablet (5 mg total) by mouth 2 (two) times daily. 180 tablet 2  . atorvastatin (LIPITOR) 80 MG tablet Take one-half tablet by  mouth daily 45 tablet 3  . CALCIUM PO Take 600 mg by mouth 2 (two) times daily.    . Cyanocobalamin (VITAMIN B 12 PO) Take 1 tablet by mouth daily.    Marland Kitchen DILT-XR 120 MG 24 hr capsule Take 1 capsule by mouth  daily 90 capsule 3  . fenofibrate (TRICOR) 145 MG tablet Take 1 tablet by mouth  daily 90 tablet 3  . fexofenadine (ALLEGRA) 180 MG tablet Take 180 mg by mouth daily.    Marland Kitchen FOLIC ACID PO Take 1 tablet by mouth daily.    Marland Kitchen GLUCOSAMINE CHONDROITIN COMPLX PO Take 1 tablet by mouth 2 (two) times daily.     Marland Kitchen Lysine 500 MG TABS Take 500 mg by mouth 3 (three) times daily.    . Magnesium 250 MG TABS Take 250 mg by mouth 2 (two) times daily.     . Omega-3 Fatty Acids  (FISH OIL) 1200 MG CAPS Take 1,200 mg by mouth daily.     . potassium chloride (KLOR-CON M10) 10 MEQ tablet Take 1 tablet (10 mEq total) by mouth 2 (two) times daily. 180 tablet 1  . tadalafil (CIALIS) 20 MG tablet Take 1 tablet (20 mg total) by mouth daily as needed. For ED 30 tablet 3  . TIKOSYN 500 MCG capsule Take 1 capsule by mouth  twice daily 180 capsule 3  . triamcinolone (NASACORT) 55 MCG/ACT AERO nasal inhaler Place 2 sprays into both nostrils daily.     Marland Kitchen zolpidem (AMBIEN) 10 MG tablet Take 0.5 tablets (5 mg total) by mouth at bedtime as needed for sleep. 90 tablet 1   No current facility-administered medications for this visit.    Allergies  Allergen Reactions  . Amiodarone     REACTION: peripheral neuropathy  . Niacin     REACTION: irregular heartbeat  . Ace Inhibitors     REACTION: cough  . Mometasone Furoate Other (See Comments)    (Nasonex) Causes nasal bleeding and nose bleeds    Review of Systems negative except from HPI and PMH  Physical Exam BP 122/66 mmHg  Pulse 71  Ht 6\' 3"  (1.905 m)  Wt 211 lb (95.709 kg)  BMI 26.37 kg/m2 Well developed and well nourished in no acute distress HENT normal E scleral and icterus clear Neck Supple JVP flat; carotids brisk and full Clear to ausculation  *Regular rate and rhythm, no murmurs gallops or rub Soft with active bowel sounds No clubbing cyanosis non-pitting and none Edema Alert and oriented, grossly normal motor and sensory function Skin Warm and Dry  ECG demonstrates atrial paced at 71 Intervals 14/13/46 Left bundle branch block   Assessment and  Plan  Atrial fibrillation-paroxysmal  Symptomatic ventricular pacing  Pacemaker Biotronik The patient's device was interrogated and the information was fully reviewed.  The device was reprogrammed as above  LBBB  Atrial fibrillation burden is stable. He is back on full dose apixaban.  Symptomatic ventricular pacing is much relieved by reprogramming  accomplished last time.  He has developed new left bundle branch block;  while there are no symptoms to suggest cardiomyopathy; we will undertake an echo to clarify. We had a lengthy discussion regarding the implications of left bundle branch block  We spent more than 50% of our >45 min visit  in face to face counseling regarding the above

## 2015-05-05 NOTE — Interval H&P Note (Signed)
Cath Lab Visit (complete for each Cath Lab visit)  Clinical Evaluation Leading to the Procedure:   ACS: No.  Non-ACS:    Anginal Classification: CCS III  Anti-ischemic medical therapy: Maximal Therapy (2 or more classes of medications)  Non-Invasive Test Results: Intermediate-risk stress test findings: cardiac mortality 1-3%/year  Prior CABG: No previous CABG      History and Physical Interval Note:  05/05/2015 8:42 PM  [name]  has presented today for surgery, with the diagnosis of abnormal echo, decreaesed LV function  The various methods of treatment have been discussed with the patient and family. After consideration of risks, benefits and other options for treatment, the patient has consented to  Procedure(s): Left Heart Cath and Coronary Angiography (N/A) as a surgical intervention .  The patient's history has been reviewed, patient examined, no change in status, stable for surgery.  I have reviewed the patient's chart and labs.  Questions were answered to the patient's satisfaction.     Sinclair Grooms

## 2015-05-06 ENCOUNTER — Encounter (HOSPITAL_COMMUNITY)
Admission: RE | Disposition: A | Discharge status: Home / Self Care | Payer: Self-pay | Source: Ambulatory Visit | Attending: Interventional Cardiology

## 2015-05-06 ENCOUNTER — Ambulatory Visit (HOSPITAL_COMMUNITY)
Admission: RE | Admit: 2015-05-06 | Discharge: 2015-05-06 | Disposition: A | Discharge status: Home / Self Care | Payer: Medicare Other | Source: Ambulatory Visit | Attending: Interventional Cardiology | Admitting: Interventional Cardiology

## 2015-05-06 DIAGNOSIS — E785 Hyperlipidemia, unspecified: Secondary | ICD-10-CM | POA: Diagnosis not present

## 2015-05-06 DIAGNOSIS — I429 Cardiomyopathy, unspecified: Secondary | ICD-10-CM | POA: Diagnosis not present

## 2015-05-06 DIAGNOSIS — I739 Peripheral vascular disease, unspecified: Secondary | ICD-10-CM | POA: Diagnosis present

## 2015-05-06 DIAGNOSIS — I724 Aneurysm of artery of lower extremity: Secondary | ICD-10-CM | POA: Diagnosis present

## 2015-05-06 DIAGNOSIS — J309 Allergic rhinitis, unspecified: Secondary | ICD-10-CM | POA: Insufficient documentation

## 2015-05-06 DIAGNOSIS — I5042 Chronic combined systolic (congestive) and diastolic (congestive) heart failure: Secondary | ICD-10-CM

## 2015-05-06 DIAGNOSIS — I2510 Atherosclerotic heart disease of native coronary artery without angina pectoris: Secondary | ICD-10-CM | POA: Insufficient documentation

## 2015-05-06 DIAGNOSIS — I4891 Unspecified atrial fibrillation: Secondary | ICD-10-CM | POA: Diagnosis not present

## 2015-05-06 DIAGNOSIS — I447 Left bundle-branch block, unspecified: Secondary | ICD-10-CM | POA: Insufficient documentation

## 2015-05-06 DIAGNOSIS — Z950 Presence of cardiac pacemaker: Secondary | ICD-10-CM | POA: Diagnosis present

## 2015-05-06 DIAGNOSIS — I5022 Chronic systolic (congestive) heart failure: Secondary | ICD-10-CM | POA: Insufficient documentation

## 2015-05-06 DIAGNOSIS — G4733 Obstructive sleep apnea (adult) (pediatric): Secondary | ICD-10-CM | POA: Diagnosis not present

## 2015-05-06 DIAGNOSIS — G629 Polyneuropathy, unspecified: Secondary | ICD-10-CM | POA: Diagnosis not present

## 2015-05-06 DIAGNOSIS — Z86718 Personal history of other venous thrombosis and embolism: Secondary | ICD-10-CM | POA: Diagnosis not present

## 2015-05-06 DIAGNOSIS — F329 Major depressive disorder, single episode, unspecified: Secondary | ICD-10-CM | POA: Diagnosis not present

## 2015-05-06 DIAGNOSIS — Z96643 Presence of artificial hip joint, bilateral: Secondary | ICD-10-CM | POA: Insufficient documentation

## 2015-05-06 HISTORY — PX: CARDIAC CATHETERIZATION: SHX172

## 2015-05-06 SURGERY — LEFT HEART CATH AND CORONARY ANGIOGRAPHY
Anesthesia: LOCAL

## 2015-05-06 MED ORDER — SODIUM CHLORIDE 0.9 % WEIGHT BASED INFUSION: 3.0000 mL/kg/h | INTRAVENOUS | Status: DC

## 2015-05-06 MED ORDER — ASPIRIN 81 MG PO CHEW
CHEWABLE_TABLET | ORAL | Status: AC
  Administered 2015-05-06: 81 mg
  Filled 2015-05-06: qty 1

## 2015-05-06 MED ORDER — FENTANYL CITRATE (PF) 100 MCG/2ML IJ SOLN
INTRAMUSCULAR | Status: DC | PRN
  Administered 2015-05-06: 50 ug via INTRAVENOUS

## 2015-05-06 MED ORDER — SODIUM CHLORIDE 0.9 % IV SOLN: 250.0000 mL | INTRAVENOUS | Status: DC | PRN

## 2015-05-06 MED ORDER — VERAPAMIL HCL 2.5 MG/ML IV SOLN
INTRAVENOUS | Status: DC | PRN
  Administered 2015-05-06: 14:00:00 via INTRA_ARTERIAL

## 2015-05-06 MED ORDER — SODIUM CHLORIDE 0.9 % IJ SOLN: 3.0000 mL | Freq: Two times a day (BID) | INTRAMUSCULAR | Status: DC

## 2015-05-06 MED ORDER — HEPARIN (PORCINE) IN NACL 2-0.9 UNIT/ML-% IJ SOLN
INTRAMUSCULAR | Status: AC
  Filled 2015-05-06: qty 1000

## 2015-05-06 MED ORDER — SODIUM CHLORIDE 0.9 % IV SOLN
INTRAVENOUS | Status: DC
  Administered 2015-05-06: 11:00:00 via INTRAVENOUS

## 2015-05-06 MED ORDER — OXYCODONE-ACETAMINOPHEN 5-325 MG PO TABS: 1.0000 | ORAL_TABLET | ORAL | Status: DC | PRN

## 2015-05-06 MED ORDER — VERAPAMIL HCL 2.5 MG/ML IV SOLN
INTRAVENOUS | Status: AC
  Filled 2015-05-06: qty 2

## 2015-05-06 MED ORDER — MIDAZOLAM HCL 2 MG/2ML IJ SOLN
INTRAMUSCULAR | Status: DC | PRN
  Administered 2015-05-06: 1 mg via INTRAVENOUS

## 2015-05-06 MED ORDER — HEPARIN SODIUM (PORCINE) 1000 UNIT/ML IJ SOLN
INTRAMUSCULAR | Status: DC | PRN
  Administered 2015-05-06: 4500 [IU] via INTRAVENOUS

## 2015-05-06 MED ORDER — ONDANSETRON HCL 4 MG/2ML IJ SOLN: 4.0000 mg | Freq: Four times a day (QID) | INTRAMUSCULAR | Status: DC | PRN

## 2015-05-06 MED ORDER — HEPARIN (PORCINE) IN NACL 2-0.9 UNIT/ML-% IJ SOLN
INTRAMUSCULAR | Status: DC | PRN
  Administered 2015-05-06: 15:00:00

## 2015-05-06 MED ORDER — SODIUM CHLORIDE 0.9 % IJ SOLN: 3.0000 mL | INTRAMUSCULAR | Status: DC | PRN

## 2015-05-06 MED ORDER — LIDOCAINE HCL (PF) 1 % IJ SOLN
INTRAMUSCULAR | Status: AC
  Filled 2015-05-06: qty 30

## 2015-05-06 MED ORDER — ACETAMINOPHEN 325 MG PO TABS: 650.0000 mg | ORAL_TABLET | ORAL | Status: DC | PRN

## 2015-05-06 MED ORDER — HEPARIN SODIUM (PORCINE) 1000 UNIT/ML IJ SOLN
INTRAMUSCULAR | Status: AC
  Filled 2015-05-06: qty 1

## 2015-05-06 MED ORDER — IOHEXOL 350 MG/ML SOLN
INTRAVENOUS | Status: DC | PRN
  Administered 2015-05-06: 100 mL via INTRAVENOUS

## 2015-05-06 MED ORDER — FENTANYL CITRATE (PF) 100 MCG/2ML IJ SOLN
INTRAMUSCULAR | Status: AC
  Filled 2015-05-06: qty 4

## 2015-05-06 MED ORDER — MIDAZOLAM HCL 2 MG/2ML IJ SOLN
INTRAMUSCULAR | Status: AC
  Filled 2015-05-06: qty 4

## 2015-05-06 SURGICAL SUPPLY — 9 items
CATH INFINITI 5 FR JL3.5 (CATHETERS) ×2 IMPLANT
CATH INFINITI JR4 5F (CATHETERS) ×2 IMPLANT
DEVICE RAD COMP TR BAND LRG (VASCULAR PRODUCTS) ×2 IMPLANT
GLIDESHEATH SLEND A-KIT 6F 22G (SHEATH) ×2 IMPLANT
KIT HEART LEFT (KITS) ×2 IMPLANT
PACK CARDIAC CATHETERIZATION (CUSTOM PROCEDURE TRAY) ×2 IMPLANT
TRANSDUCER W/STOPCOCK (MISCELLANEOUS) ×2 IMPLANT
TUBING CIL FLEX 10 FLL-RA (TUBING) ×2 IMPLANT
WIRE SAFE-T 1.5MM-J .035X260CM (WIRE) ×2 IMPLANT

## 2015-05-06 NOTE — Interval H&P Note (Signed)
Cath Lab Visit (complete for each Cath Lab visit)  Clinical Evaluation Leading to the Procedure:   ACS: No.  Non-ACS:    Anginal Classification: CCS III  Anti-ischemic medical therapy: Maximal Therapy (2 or more classes of medications)  Non-Invasive Test Results: Intermediate-risk stress test findings: cardiac mortality 1-3%/year  Prior CABG: No previous CABG      History and Physical Interval Note:  05/06/2015 2:14 PM  [name]  has presented today for surgery, with the diagnosis of abnormal echo, decreaesed LV function  The various methods of treatment have been discussed with the patient and family. After consideration of risks, benefits and other options for treatment, the patient has consented to  Procedure(s): Left Heart Cath and Coronary Angiography (N/A) as a surgical intervention .  The patient's history has been reviewed, patient examined, no change in status, stable for surgery.  I have reviewed the patient's chart and labs.  Questions were answered to the patient's satisfaction.     Sinclair Grooms

## 2015-05-06 NOTE — Discharge Instructions (Signed)
Radial Site Care °Refer to this sheet in the next few weeks. These instructions provide you with information on caring for yourself after your procedure. Your caregiver may also give you more specific instructions. Your treatment has been planned according to current medical practices, but problems sometimes occur. Call your caregiver if you have any problems or questions after your procedure. °HOME CARE INSTRUCTIONS °· You may shower the day after the procedure. Remove the bandage (dressing) and gently wash the site with plain soap and water. Gently pat the site dry. °· Do not apply powder or lotion to the site. °· Do not submerge the affected site in water for 3 to 5 days. °· Inspect the site at least twice daily. °· Do not flex or bend the affected arm for 24 hours. °· No lifting over 5 pounds (2.3 kg) for 5 days after your procedure. °· Do not drive home if you are discharged the same day of the procedure. Have someone else drive you. °· You may drive 24 hours after the procedure unless otherwise instructed by your caregiver. °· Do not operate machinery or power tools for 24 hours. °· A responsible adult should be with you for the first 24 hours after you arrive home. °What to expect: °· Any bruising will usually fade within 1 to 2 weeks. °· Blood that collects in the tissue (hematoma) may be painful to the touch. It should usually decrease in size and tenderness within 1 to 2 weeks. °SEEK IMMEDIATE MEDICAL CARE IF: °· You have unusual pain at the radial site. °· You have redness, warmth, swelling, or pain at the radial site. °· You have drainage (other than a small amount of blood on the dressing). °· You have chills. °· You have a fever or persistent symptoms for more than 72 hours. °· You have a fever and your symptoms suddenly get worse. °· Your arm becomes pale, cool, tingly, or numb. °· You have heavy bleeding from the site. Hold pressure on the site. °Document Released: 09/30/2010 Document Revised:  11/20/2011 Document Reviewed: 09/30/2010 °ExitCare® Patient Information ©2015 [employer]. This information is not intended to replace advice given to you by your health care provider. Make sure you discuss any questions you have with your health care provider. ° °

## 2015-05-07 ENCOUNTER — Encounter (HOSPITAL_COMMUNITY): Payer: Self-pay | Admitting: Interventional Cardiology

## 2015-05-18 ENCOUNTER — Encounter: Payer: Self-pay | Admitting: Internal Medicine

## 2015-05-18 ENCOUNTER — Ambulatory Visit (INDEPENDENT_AMBULATORY_CARE_PROVIDER_SITE_OTHER): Payer: Medicare Other | Admitting: Internal Medicine

## 2015-05-18 VITALS — BP 108/68 | HR 64 | Ht 75.0 in | Wt 210.4 lb

## 2015-05-18 DIAGNOSIS — I447 Left bundle-branch block, unspecified: Secondary | ICD-10-CM | POA: Diagnosis not present

## 2015-05-18 DIAGNOSIS — I495 Sick sinus syndrome: Secondary | ICD-10-CM | POA: Diagnosis not present

## 2015-05-18 DIAGNOSIS — Z79899 Other long term (current) drug therapy: Secondary | ICD-10-CM | POA: Diagnosis not present

## 2015-05-18 DIAGNOSIS — Z45018 Encounter for adjustment and management of other part of cardiac pacemaker: Secondary | ICD-10-CM

## 2015-05-18 LAB — CUP PACEART INCLINIC DEVICE CHECK
Date Time Interrogation Session: 20160906170953
Lead Channel Impedance Value: 331 Ohm
Lead Channel Impedance Value: 351 Ohm
Lead Channel Pacing Threshold Amplitude: 1.1 V
Lead Channel Pacing Threshold Pulse Width: 0.4 ms
Lead Channel Pacing Threshold Pulse Width: 0.4 ms
Lead Channel Sensing Intrinsic Amplitude: 3.8 mV
MDC IDC MSMT LEADCHNL RA PACING THRESHOLD AMPLITUDE: 0.8 V
MDC IDC MSMT LEADCHNL RA SENSING INTR AMPL: 2.4 mV
MDC IDC SET LEADCHNL RA PACING AMPLITUDE: 1.6 V
MDC IDC SET LEADCHNL RV PACING AMPLITUDE: 2 V
MDC IDC SET LEADCHNL RV PACING PULSEWIDTH: 0.4 ms
MDC IDC STAT BRADY RA PERCENT PACED: 58 %
MDC IDC STAT BRADY RV PERCENT PACED: 16 %
Pulse Gen Model: 359524
Pulse Gen Serial Number: 66339555

## 2015-05-18 MED ORDER — ATORVASTATIN CALCIUM 80 MG PO TABS: ORAL_TABLET | ORAL | Status: DC

## 2015-05-18 MED ORDER — LOSARTAN POTASSIUM 25 MG PO TABS
25.0000 mg | ORAL_TABLET | Freq: Every day | ORAL | Status: DC
Start: 2015-05-18 — End: 2016-04-19

## 2015-05-18 NOTE — Patient Instructions (Addendum)
Medication Instructions:  Your physician has recommended you make the following change in your medication:  1) INCREASE Atorvastatin to 80 mg daily 2) START Losartan 25 mg daily -- please call office if you develop a cough after starting this medication.  Labwork: Medication management labs in 2 weeks: BMET  Testing/Procedures: Your physician has requested that you have an echocardiogram. Echocardiography is a painless test that uses sound waves to create images of your heart. It provides your doctor with information about the size and shape of your heart and how well your heart's chambers and valves are working. This procedure takes approximately one hour. There are no restrictions for this procedure.  Follow-Up: Your physician recommends that you schedule a follow-up appointment in: 3 months after echo is completed.   Any Other Special Instructions Will Be Listed Below (If Applicable). Thank you for choosing [facility]!!   Trinidad Curet, RN [phone]

## 2015-05-18 NOTE — Progress Notes (Signed)
Patient Care Team: Biagio Borg, MD as PCP - General (Internal Medicine) Deboraha Sprang, MD (Cardiology) Melrose Nakayama, MD as Consulting Physician (Orthopedic Surgery)   HPI  [name] is a 74 y.o. 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   His pacemaker reached ERI 8/13 and he underwent generator replacement with a Biotronik device because of his CLS function  He takes dofetilide for his atrial fibrillation  Potassium levels were normal to weeks ago. Magnesium levels will be checked.   At his last visit in August he was noted to have new left bundle branch block. Assessment of LV function surprisingly demonstrated significant impairment in LV function 30-35%  subsequently underwent catheterization demonstrating modest nonobstructive disease confirming LV dysfunction  >>Diffuse noncritical coronary disease involving each of the 3 major coronaries. Both the mid RCA and mid LAD contained up to 65% stenosis. The previously placed proximal LAD stent is widely patent. The small first diagonal contains 90% stenosis and is jailed by the stent.    The patient denies chest pain, shortness of breath, nocturnal dyspnea, orthopnea or peripheral edema.  There have been scant  Palpitations, but no lightheadedness or syncope.   He does have a dryness his cough which dates back about 30 years. He is quite distinct from his PVCs.     Past Medical History  Diagnosis Date  . Hyperlipidemia     takes Tricor and Lipitor daily  . Coronary artery disease     LAD stenting 2004 non-DES  . Atrial fibrillation     takes Tikosyn and Eliquis daily  . Peripheral vascular disease   . Allergic rhinitis     takes Allegra daily  . Arthritis   . Bilateral popliteal artery aneurysm   . Pacemaker   . Allergic rhinitis, cause unspecified 06/22/2012  . Insomnia     takes  Ambien nightly as needed  . OSA (obstructive sleep apnea)     CPAP  . Pleurisy     early 9's  . 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 Joint pain   . Joint swelling   . Dysphagia     occasionally  . History of colon polyps   . Diverticulosis   . Urinary urgency   . Depression     took Zoloft 39yrs ago but nothing now  . History of blood clots 2003    left leg prior to fem pop  . DVT (deep venous thrombosis)     Past Surgical History  Procedure Laterality Date  . Inguinal hernia repair Right   . Stent (unspec)      x2  . Pacemaker insertion      due to bradycardia  . Right and left fem-pop bypass    . Hand surgery Left   . Left knee surgery    . Cardiac catheterization  2004  . Hand and arm surgery Right     as a teenager  . Turbinate reduction    . Wisdom extracted     . Colonoscopy    . Total hip arthroplasty Left 12/09/2013    Procedure: TOTAL HIP ARTHROPLASTY ANTERIOR APPROACH;  Surgeon: Hessie Dibble, MD;  Location: [facility];  Service: Orthopedics;  Laterality: Left;  left anterior total hip arthroplasty  . Joint replacement Left December 09, 2013    Left Hip   .  Joint replacement Right Aug. 25, 2015    Right Hip  . Total hip arthroplasty Right 05/05/2014    Procedure: TOTAL HIP ARTHROPLASTY ANTERIOR APPROACH;  Surgeon: Hessie Dibble, MD;  Location: [facility];  Service: Orthopedics;  Laterality: Right;  . Permanent pacemaker generator change N/A 06/17/2012    Procedure: PERMANENT PACEMAKER GENERATOR CHANGE;  Surgeon: Deboraha Sprang, MD;  Location: [facility] CATH LAB;  Service: Cardiovascular;  Laterality: N/A;  . Cardiac catheterization N/A 05/06/2015    Procedure: Left Heart Cath and Coronary Angiography;  Surgeon: Belva Crome, MD;  Location: [facility] CV LAB;  Service: Cardiovascular;  Laterality: N/A;    Current Outpatient Prescriptions  Medication Sig Dispense Refill  . apixaban (ELIQUIS) 5 MG TABS tablet Take 1 tablet (5 mg total) by mouth 2 (two)  times daily. 180 tablet 2  . atorvastatin (LIPITOR) 80 MG tablet Take one-half tablet by  mouth daily 45 tablet 3  . CALCIUM PO Take 600 mg by mouth 2 (two) times daily.    . clobetasol cream (TEMOVATE) 2.87 % Apply 1 application topically daily.    . Cyanocobalamin (VITAMIN B 12 PO) Take 1 tablet by mouth daily.    Marland Kitchen DILT-XR 120 MG 24 hr capsule Take 1 capsule by mouth  daily 90 capsule 3  . fenofibrate (TRICOR) 145 MG tablet Take 1 tablet by mouth  daily 90 tablet 3  . fexofenadine (ALLEGRA) 180 MG tablet Take 180 mg by mouth daily.    Marland Kitchen FOLIC ACID PO Take 1 tablet by mouth daily.    Marland Kitchen GLUCOSAMINE CHONDROITIN COMPLX PO Take 1 tablet by mouth 2 (two) times daily.     Marland Kitchen Lysine 500 MG TABS Take 500 mg by mouth 3 (three) times daily.    . Magnesium 250 MG TABS Take 250 mg by mouth 2 (two) times daily.     . Omega-3 Fatty Acids (FISH OIL) 1200 MG CAPS Take 1,200 mg by mouth daily.     . potassium chloride (KLOR-CON M10) 10 MEQ tablet Take 1 tablet (10 mEq total) by mouth 2 (two) times daily. 180 tablet 1  . tadalafil (CIALIS) 20 MG tablet Take 1 tablet (20 mg total) by mouth daily as needed. For ED 30 tablet 3  . TIKOSYN 500 MCG capsule Take 1 capsule by mouth  twice daily 180 capsule 3  . triamcinolone (NASACORT) 55 MCG/ACT AERO nasal inhaler Place 2 sprays into both nostrils daily.     Marland Kitchen zolpidem (AMBIEN) 10 MG tablet Take 0.5 tablets (5 mg total) by mouth at bedtime as needed for sleep. 90 tablet 1   No current facility-administered medications for this visit.    Allergies  Allergen Reactions  . Amiodarone     REACTION: peripheral neuropathy  . Niacin     REACTION: irregular heartbeat  . Ace Inhibitors     REACTION: cough  . Mometasone Furoate Other (See Comments)    (Nasonex) Causes nasal bleeding and nose bleeds    Review of Systems negative except from HPI and PMH  Physical Exam BP 108/68 mmHg  Pulse 64  Ht 6\' 3"  (1.905 m)  Wt 210 lb 6.4 oz (95.437 kg)  BMI 26.30  kg/m2 Well developed and well nourished in no acute distress HENT normal E scleral and icterus clear Neck Supple JVP flat; carotids brisk and full Clear to ausculation  *Regular rate and rhythm, no murmurs gallops or rub Soft with active bowel sounds No clubbing cyanosis non-pitting and none Edema  Alert and oriented, grossly normal motor and sensory function Skin Warm and Dry  ECG demonstrates atrial paced at 71 Intervals 14/13/46 Left bundle branch block   Assessment and  Plan  Atrial fibrillation-paroxysmal  Symptomatic ventricular pacing  Pacemaker Biotronik The patient's device was interrogated and the information was fully reviewed.  The device was reprogrammed as above  LBBB -question rate related  Nonischemic cardiac myopathy  Coronary artery disease A) prior stenting B) diffuse nonobstructive disease  With his asymptomatic cardiomyopathy, data from [facility] would suggest value an ACE inhibitor therapy. He has been intolerant of this in the past so we will begin him on an ARB with the hopes that we improve LV function. We will use losartan 25 mg.  The value of beta blockers with cardiomyopathy and atrial fibrillation has not been proven definitively. Hence, for now we will continue him on diltiazem. We will reassess in 3 months time. In the event that at that time LV function is not improved we will switch from a calcium blocker to a beta blocker anyway not withstanding the possibility of data.  We have reviewed issues related to cardiac myopathy terms of heart failure arrhythmic risk  We will continue him on aspirin therapy. As well as his statins albeit at a reduced dose for  reasons attributable to a lack of decision-making and his physician. We'll increase it from 40--80 now.

## 2015-05-25 ENCOUNTER — Other Ambulatory Visit: Payer: Self-pay | Admitting: Internal Medicine

## 2015-05-26 ENCOUNTER — Encounter: Payer: Self-pay | Admitting: Internal Medicine

## 2015-05-26 MED ORDER — POTASSIUM CHLORIDE CRYS ER 10 MEQ PO TBCR: EXTENDED_RELEASE_TABLET | ORAL | Status: DC

## 2015-06-01 ENCOUNTER — Other Ambulatory Visit (INDEPENDENT_AMBULATORY_CARE_PROVIDER_SITE_OTHER): Payer: Medicare Other | Admitting: *Deleted

## 2015-06-01 DIAGNOSIS — Z79899 Other long term (current) drug therapy: Secondary | ICD-10-CM

## 2015-06-01 NOTE — Addendum Note (Signed)
Addended by: Eulis Foster on: 06/01/2015 04:36 PM   Modules accepted: Orders

## 2015-06-02 LAB — BASIC METABOLIC PANEL
BUN: 26 mg/dL — AB (ref 6–23)
CO2: 30 meq/L (ref 19–32)
Calcium: 9.6 mg/dL (ref 8.4–10.5)
Chloride: 106 mEq/L (ref 96–112)
Creatinine, Ser: 1.42 mg/dL (ref 0.40–1.50)
GFR: 51.56 mL/min — AB (ref >60.00)
GLUCOSE: 88 mg/dL (ref 70–99)
POTASSIUM: 4.2 meq/L (ref 3.5–5.1)
Sodium: 140 mEq/L (ref 135–145)

## 2015-06-12 DIAGNOSIS — J0100 Acute maxillary sinusitis, unspecified: Secondary | ICD-10-CM | POA: Diagnosis not present

## 2015-06-12 DIAGNOSIS — H6691 Otitis media, unspecified, right ear: Secondary | ICD-10-CM | POA: Diagnosis not present

## 2015-07-07 ENCOUNTER — Other Ambulatory Visit: Payer: Self-pay | Admitting: Internal Medicine

## 2015-08-02 ENCOUNTER — Encounter: Payer: Self-pay | Admitting: Internal Medicine

## 2015-08-02 ENCOUNTER — Other Ambulatory Visit: Payer: Self-pay | Admitting: *Deleted

## 2015-08-02 ENCOUNTER — Other Ambulatory Visit: Payer: Self-pay | Admitting: Internal Medicine

## 2015-08-10 ENCOUNTER — Encounter: Payer: Self-pay | Admitting: Internal Medicine

## 2015-08-16 ENCOUNTER — Encounter: Payer: Self-pay | Admitting: Internal Medicine

## 2015-08-17 ENCOUNTER — Ambulatory Visit (HOSPITAL_COMMUNITY): Payer: Medicare Other | Attending: Cardiology

## 2015-08-17 ENCOUNTER — Other Ambulatory Visit: Payer: Self-pay

## 2015-08-17 DIAGNOSIS — I371 Nonrheumatic pulmonary valve insufficiency: Secondary | ICD-10-CM | POA: Insufficient documentation

## 2015-08-17 DIAGNOSIS — I351 Nonrheumatic aortic (valve) insufficiency: Secondary | ICD-10-CM | POA: Diagnosis not present

## 2015-08-17 DIAGNOSIS — G4733 Obstructive sleep apnea (adult) (pediatric): Secondary | ICD-10-CM | POA: Diagnosis not present

## 2015-08-17 DIAGNOSIS — I071 Rheumatic tricuspid insufficiency: Secondary | ICD-10-CM | POA: Insufficient documentation

## 2015-08-17 DIAGNOSIS — I10 Essential (primary) hypertension: Secondary | ICD-10-CM | POA: Diagnosis not present

## 2015-08-17 DIAGNOSIS — I358 Other nonrheumatic aortic valve disorders: Secondary | ICD-10-CM | POA: Insufficient documentation

## 2015-08-17 DIAGNOSIS — E785 Hyperlipidemia, unspecified: Secondary | ICD-10-CM | POA: Diagnosis not present

## 2015-08-17 DIAGNOSIS — I340 Nonrheumatic mitral (valve) insufficiency: Secondary | ICD-10-CM | POA: Insufficient documentation

## 2015-08-17 DIAGNOSIS — I447 Left bundle-branch block, unspecified: Secondary | ICD-10-CM | POA: Diagnosis not present

## 2015-08-17 DIAGNOSIS — I517 Cardiomegaly: Secondary | ICD-10-CM | POA: Diagnosis not present

## 2015-08-27 ENCOUNTER — Encounter: Payer: Self-pay | Admitting: Internal Medicine

## 2015-08-27 ENCOUNTER — Ambulatory Visit (INDEPENDENT_AMBULATORY_CARE_PROVIDER_SITE_OTHER): Payer: Medicare Other | Admitting: Internal Medicine

## 2015-08-27 VITALS — BP 117/70 | HR 68 | Ht 75.0 in | Wt 219.4 lb

## 2015-08-27 DIAGNOSIS — I255 Ischemic cardiomyopathy: Secondary | ICD-10-CM

## 2015-08-27 DIAGNOSIS — Z45018 Encounter for adjustment and management of other part of cardiac pacemaker: Secondary | ICD-10-CM | POA: Diagnosis not present

## 2015-08-27 DIAGNOSIS — Z950 Presence of cardiac pacemaker: Secondary | ICD-10-CM | POA: Diagnosis not present

## 2015-08-27 DIAGNOSIS — I480 Paroxysmal atrial fibrillation: Secondary | ICD-10-CM

## 2015-08-27 DIAGNOSIS — I495 Sick sinus syndrome: Secondary | ICD-10-CM | POA: Diagnosis not present

## 2015-08-27 LAB — CUP PACEART INCLINIC DEVICE CHECK
Brady Statistic RA Percent Paced: 49 %
Implantable Lead Implant Date: 19980115
Implantable Lead Location: 753860
Lead Channel Impedance Value: 331 Ohm
Lead Channel Pacing Threshold Amplitude: 0.9 V
Lead Channel Sensing Intrinsic Amplitude: 3 mV
Lead Channel Setting Pacing Amplitude: 2 V
Lead Channel Setting Pacing Pulse Width: 0.4 ms
MDC IDC LEAD IMPLANT DT: 19980115
MDC IDC LEAD LOCATION: 753859
MDC IDC MSMT LEADCHNL RA PACING THRESHOLD PULSEWIDTH: 0.4 ms
MDC IDC MSMT LEADCHNL RV IMPEDANCE VALUE: 351 Ohm
MDC IDC MSMT LEADCHNL RV PACING THRESHOLD AMPLITUDE: 0.9 V
MDC IDC MSMT LEADCHNL RV PACING THRESHOLD PULSEWIDTH: 0.4 ms
MDC IDC MSMT LEADCHNL RV SENSING INTR AMPL: 4.8 mV
MDC IDC PG SERIAL: 66339555
MDC IDC SESS DTM: 20161216182807
MDC IDC SET LEADCHNL RA PACING AMPLITUDE: 1.6 V
MDC IDC STAT BRADY RV PERCENT PACED: 7 %

## 2015-08-27 LAB — BASIC METABOLIC PANEL
BUN: 16 mg/dL (ref 7–25)
CALCIUM: 9.3 mg/dL (ref 8.6–10.3)
CO2: 28 mmol/L (ref 20–31)
Chloride: 109 mmol/L (ref 98–110)
Creat: 1.18 mg/dL (ref 0.70–1.18)
GLUCOSE: 83 mg/dL (ref 65–99)
Potassium: 4.2 mmol/L (ref 3.5–5.3)
Sodium: 143 mmol/L (ref 135–146)

## 2015-08-27 LAB — MAGNESIUM: MAGNESIUM: 2.1 mg/dL (ref 1.5–2.5)

## 2015-08-27 NOTE — Patient Instructions (Signed)
Medication Instructions: - You are being given prescriptions for 3 different beta blockers to try. You may try them in any order, but DO NOT take them at the same time. ** When starting these please take 1/2 tablet for the first 5 days.  1) Toprol (metoprolol succinate) 50 mg one tablet by mouth once daily 2) Coreg (carvedilol) 6.25 mg one tablet by mouth twice daily 3) Bisoprolol 5 mg one tablet by mouth once daily  Labwork: - Your physician recommends that you have lab work today: BMP/ magnesium  Procedures/Testing: - Your physician has requested that you have an echocardiogram in 3 months . Echocardiography is a painless test that uses sound waves to create images of your heart. It provides your doctor with information about the size and shape of your heart and how well your heart's chambers and valves are working. This procedure takes approximately one hour. There are no restrictions for this procedure.  Follow-Up: - Your physician recommends that you schedule a follow-up appointment in: 3 months with Dr. Caryl Comes  Any Additional Special Instructions Will Be Listed Below (If Applicable).

## 2015-08-27 NOTE — Progress Notes (Signed)
Cardiology Office Note Date:  08/27/2015  Patient ID:  [name] [birth year], MRN [id number] PCP:  Cathlean Cower, MD  Cardiologist/electrophysiologist: Dr. Caryl Comes   Chief Complaint: f/u on his echo result  History of Present Illness: [name] is a 74 y.o. male with history of CAD, PAFib, HLD, PPM, OSA using CPAP, PVD with remote left Fem-pop 2003 and popliteal art aneurysms.  He comes in today feeling improved since starting the losartan, he is walking for exercise, denies any exertional incopacities with walking, stairs can make him winded, but this improved as well and his overall sense of well being  Is better.  He denies any kind of CP, no dizziness, near syncope or syncope.  He has longstanding infrequent palpitations.   Past Medical History  Diagnosis Date  . Hyperlipidemia     takes Tricor and Lipitor daily  . Coronary artery disease     LAD stenting 2004 non-DES  . Atrial fibrillation     takes Tikosyn and Eliquis daily  . Peripheral vascular disease   . Allergic rhinitis     takes Allegra daily  . Arthritis   . Bilateral popliteal artery aneurysm   . Pacemaker   . Allergic rhinitis, cause unspecified 06/22/2012  . Insomnia     takes Ambien nightly as needed  . OSA (obstructive sleep apnea)     CPAP  . Pleurisy     early 5's  . 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 Joint pain   . Joint swelling   . Dysphagia     occasionally  . History of colon polyps   . Diverticulosis   . Urinary urgency   . Depression     took Zoloft 4yrs ago but nothing now  . History of blood clots 2003    left leg prior to fem pop  . DVT (deep venous thrombosis)     Past Surgical History  Procedure Laterality Date  . Inguinal hernia repair Right   . Stent (unspec)      x2  . Pacemaker insertion      due to bradycardia  . Right and left fem-pop bypass    . Hand surgery Left   . Left knee surgery    . Cardiac catheterization  2004  . Hand  and arm surgery Right     as a teenager  . Turbinate reduction    . Wisdom extracted     . Colonoscopy    . Total hip arthroplasty Left 12/09/2013    Procedure: TOTAL HIP ARTHROPLASTY ANTERIOR APPROACH;  Surgeon: Hessie Dibble, MD;  Location: [place] [facility];  Service: Orthopedics;  Laterality: Left;  left anterior total hip arthroplasty  . Joint replacement Left December 09, 2013    Left Hip   . Joint replacement Right Aug. 25, 2015    Right Hip  . Total hip arthroplasty Right 05/05/2014    Procedure: TOTAL HIP ARTHROPLASTY ANTERIOR APPROACH;  Surgeon: Hessie Dibble, MD;  Location: [facility];  Service: Orthopedics;  Laterality: Right;  . Permanent pacemaker generator change N/A 06/17/2012    Procedure: PERMANENT PACEMAKER GENERATOR CHANGE;  Surgeon: Deboraha Sprang, MD;  Location: [facility] CATH LAB;  Service: Cardiovascular;  Laterality: N/A;  . Cardiac catheterization N/A 05/06/2015    Procedure: Left Heart Cath and Coronary Angiography;  Surgeon: Belva Crome, MD;  Location: [place] [facility] CV LAB;  Service: Cardiovascular;  Laterality: N/A;    Current  Outpatient Prescriptions  Medication Sig Dispense Refill  . apixaban (ELIQUIS) 5 MG TABS tablet Take 1 tablet (5 mg total) by mouth 2 (two) times daily. 180 tablet 2  . atorvastatin (LIPITOR) 80 MG tablet Take one tablet by  mouth daily 90 tablet 3  . CALCIUM PO Take 600 mg by mouth 2 (two) times daily.    . clobetasol cream (TEMOVATE) [id number] % Apply 1 application topically daily.    . Cyanocobalamin (VITAMIN B 12 PO) Take 1 tablet by mouth daily.    Marland Kitchen DILT-XR 120 MG 24 hr capsule Take 1 capsule by mouth  daily 90 capsule 3  . dofetilide (TIKOSYN) 500 MCG capsule Take 1 capsule by mouth  twice daily 180 capsule 1  . fenofibrate (TRICOR) 145 MG tablet Take 1 tablet by mouth  daily 90 tablet 3  . fexofenadine (ALLEGRA) 180 MG tablet Take 180 mg by mouth daily.    Marland Kitchen FOLIC ACID PO Take 1 tablet by mouth daily.    Marland Kitchen GLUCOSAMINE CHONDROITIN COMPLX PO Take 1 tablet by  mouth 2 (two) times daily.     Marland Kitchen losartan (COZAAR) 25 MG tablet Take 1 tablet (25 mg total) by mouth daily. 90 tablet 3  . Lysine 500 MG TABS Take 500 mg by mouth 3 (three) times daily.    . Magnesium 250 MG TABS Take 250 mg by mouth 2 (two) times daily.     . Omega-3 Fatty Acids (FISH OIL) 1200 MG CAPS Take 1,200 mg by mouth daily.     . potassium chloride (K-DUR,KLOR-CON) 10 MEQ tablet Take 1 tablet by mouth two  times daily 180 tablet 3  . tadalafil (CIALIS) 20 MG tablet Take 1 tablet (20 mg total) by mouth daily as needed. For ED 30 tablet 3  . triamcinolone (NASACORT) 55 MCG/ACT AERO nasal inhaler Place 2 sprays into both nostrils daily.     Marland Kitchen zolpidem (AMBIEN) 10 MG tablet Take 0.5 tablets (5 mg total) by mouth at bedtime as needed for sleep. 90 tablet 1   No current facility-administered medications for this visit.    Allergies:   Amiodarone; Niacin; Ace inhibitors; and Mometasone furoate   Social History:  The patient  reports that he has never smoked. He has never used smokeless tobacco. He reports that he does not drink alcohol or use illicit drugs.   Family History:  The patient's family history includes Aneurysm in his father; Diabetes in his mother and other; Heart attack in his father; Heart disease in his father and mother; Peripheral vascular disease in his father; Sleep apnea in his other; Stroke in his father.  ROS:  Please see the history of present illness. All other systems are reviewed and otherwise negative.   PHYSICAL EXAM:  VS:  There were no vitals taken for this visit. BMI: There is no weight on file to calculate BMI. Well nourished, well developed, in no acute distress HEENT: normocephalic, atraumatic Neck: no JVD, carotid bruits or masses Cardiac:  normal S1, S2; RRR; no significant murmurs, no rubs, or gallops Lungs:  clear to auscultation bilaterally, no wheezing, rhonchi or rales Abd: soft, nontender MS: no deformity or atrophy Ext: no edema Skin: warm  and dry, no rash Neuro:  No gross deficits appreciated Psych: euthymic mood, full affect  PPM site is stable, no tethering or discomfort   EKG:  Done today shows A paced, LBBB, QTc is stable 06/07/15 QTc 499 PPM check today shows normal function, see interrogation  08/17/15:  Echocardiogram Study Conclusions - Left ventricle: The cavity size was normal. Systolic function was moderately reduced. The estimated ejection fraction was in the range of 35% to 40%. Diffuse hypokinesis. There was an increased relative contribution of atrial contraction to ventricular filling. Doppler parameters are consistent with abnormal left ventricular relaxation (grade 1 diastolic dysfunction). - Aortic valve: Moderately to severely calcified annulus. Trileaflet. Moderate diffuse thickening and calcification, consistent with sclerosis. There was trivial regurgitation. - Aorta: Aortic root dimension: 37 mm (ED). - Aortic root: The aortic root was mildly dilated. - Mitral valve: Calcified annulus. Mildly thickened leaflets . Mild thickening and calcification of the anterior leaflet. There was trivial regurgitation. - Right ventricle: The cavity size was moderately to severely dilated. Wall thickness was normal. Systolic function was mildly reduced. - Right atrium: The atrium was moderately dilated. - Tricuspid valve: There was trivial regurgitation. - Pulmonic valve: There was trivial regurgitation.   05/06/15: LHC 1. Mid RCA to Dist RCA lesion, 65% stenosed. 2. Prox Cx to Mid Cx lesion, 45% stenosed. 3. Prox RCA lesion, 40% stenosed. 4. Ost 1st Diag lesion, 90% stenosed. Jailed by previously palced stent. 5. Prox LAD to Mid LAD lesion, 5% stenosed. The lesion was previously treated with a stent (unknown type) . 6. Mid LAD to Dist LAD lesion, 60% stenosed. 7. Dist LAD lesion, 25% stenosed.   Diffuse noncritical coronary disease involving each of the 3 major coronaries. Both  the mid RCA and mid LAD contained up to 65% stenosis.  The previously placed proximal LAD stent is widely patent. The small first diagonal contains 90% stenosis and is jailed by the stent.  Dilated left ventricle with diffuse hypokinesis and an estimated ejection fraction of 35%. Findings are compatible with chronic systolic heart failure.   Recent Labs: 04/07/2015: ALT 21; TSH 1.53 05/03/2015: Hemoglobin 14.4; Platelets 191.0 06/01/2015: BUN 26*; Creatinine, Ser 1.42; Potassium 4.2; Sodium 140  04/07/2015: Cholesterol 118; HDL 43.80; LDL Cholesterol 64; Total CHOL/HDL Ratio 3; Triglycerides 51.0; VLDL 10.2   CrCl cannot be calculated (Unknown ideal weight.).   Wt Readings from Last 3 Encounters:  05/18/15 210 lb 6.4 oz (95.437 kg)  05/06/15 200 lb 5 oz (90.861 kg)  04/30/15 210 lb (95.255 kg)     Other studies reviewed: Additional studies/records reviewed today include: summarized above  DEVICE: Biotronik PPM (#3 device) CLS 2013 by Dr. Lovena Le, original device [place]:  1. PAFib, sick sinus syndrome, PPM     CHADS2Vasc is at least 4 on Eliquis     No bleeding     On Tikosyn, 9.26.16 K+ 4.2, Creat 1.42 (calc cr. Cl. 60.56)     BMET and Mag level today   2. New NCM     Exam is euvolemic  3. CAD     Stable by cath 2016     On statin/Tricor  Disposition: Stop diltiazem, The patient is given Rx for Metoprolol 50mg  PO QD, Carvedilol 6.25mg  BID, and Bisoprolol 5mg  QD.  He will start with one of them at 1/2tab dose for 5 days and up-titrate to full tab if tolerating well.  If he has symptoms with the first he will stop the first and try the second again at 1/2 tab dose for 5 days and then full tab dose, and then the third if intolerant of the second.  The patient states understanding of these instructions.  We will schedule an echo doppler for 3 months and see him then as well.  Current medicines  are reviewed at length with the patient today.  The patient did not  have any concerns regarding medicines.  Haywood Lasso, PA-C 08/27/2015 1:43 PM     [address] [place] [address] [phone] (office)  [phone] (fax)

## 2015-09-17 DIAGNOSIS — G4733 Obstructive sleep apnea (adult) (pediatric): Secondary | ICD-10-CM | POA: Diagnosis not present

## 2015-09-29 ENCOUNTER — Ambulatory Visit (INDEPENDENT_AMBULATORY_CARE_PROVIDER_SITE_OTHER): Payer: Medicare Other | Admitting: Gastroenterology

## 2015-09-29 ENCOUNTER — Encounter: Payer: Self-pay | Admitting: Gastroenterology

## 2015-09-29 VITALS — BP 104/50 | HR 76 | Ht 74.8 in | Wt 216.4 lb

## 2015-09-29 DIAGNOSIS — K645 Perianal venous thrombosis: Secondary | ICD-10-CM | POA: Diagnosis not present

## 2015-09-29 NOTE — Patient Instructions (Addendum)
Sitz baths twice daily with luke warm water, 20 min each time. Please start taking citrucel (orange flavored) powder fiber supplement.  This may cause some bloating at first but that usually goes away. Begin with a small spoonful and work your way up to a large, heaping spoonful daily over a week. Communicate the results of the above in 1-2 months.

## 2015-09-29 NOTE — Progress Notes (Signed)
Review of pertinent gastrointestinal problems: 1.  Precancerous colon polyps;  Colonoscopy July 2016 Dr. Ardis Hughs , done for routine screening found to small polyps one was a tubular adenoma. Also diverticulosis. He was recommended to have repeat colonoscopy 5 years  HPI: This is a  Very pleasant 75 year old man whom I last saw this past summer at the time of colonoscopy.  Chief complaint is external hemorrhoid  Having issues with hemorrhoid, for past 6 weeks.  Had been sitting for about an hour on the toilett.    Went away for a bout a week, then protruded again.  He feels a lump about pea-sized.    No bleeding.  The hemorrhoid is not very bothersome to him. Very occasional itch. He is able to state hygienic despite the hemorrhoid, it does not present pain or bleeding. Has itched a bit, or pinched at times.     Past Medical History  Diagnosis Date  . Hyperlipidemia     takes Tricor and Lipitor daily  . Coronary artery disease     LAD stenting 2004 non-DES  . Atrial fibrillation ([place])     takes Tikosyn and Eliquis daily  . Peripheral vascular disease ([place])   . Allergic rhinitis     takes Allegra daily  . Arthritis   . 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 Pacemaker   . Allergic rhinitis, cause unspecified 06/22/2012  . Insomnia     takes Ambien nightly as needed  . OSA (obstructive sleep apnea)     CPAP  . Pleurisy     early 44's  . 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 Joint pain   . Joint swelling   . Dysphagia     occasionally  . History of colon polyps   . Diverticulosis   . Urinary urgency   . Depression     took Zoloft 66yrs ago but nothing now  . History of blood clots 2003    left leg prior to fem pop  . DVT (deep venous thrombosis) [facility])     Past Surgical History  Procedure Laterality Date  . Inguinal hernia repair Right   . Stent (unspec)      x2  . Pacemaker insertion      due to bradycardia  . Right and left  fem-pop bypass    . Hand surgery Left   . Left knee surgery    . Cardiac catheterization  2004  . Hand and arm surgery Right     as a teenager  . Turbinate reduction    . Wisdom extracted     . Colonoscopy    . Total hip arthroplasty Left 12/09/2013    Procedure: TOTAL HIP ARTHROPLASTY ANTERIOR APPROACH;  Surgeon: Hessie Dibble, MD;  Location: [place];  Service: Orthopedics;  Laterality: Left;  left anterior total hip arthroplasty  . Joint replacement Left December 09, 2013    Left Hip   . Joint replacement Right Aug. 25, 2015    Right Hip  . Total hip arthroplasty Right 05/05/2014    Procedure: TOTAL HIP ARTHROPLASTY ANTERIOR APPROACH;  Surgeon: Hessie Dibble, MD;  Location: [place];  Service: Orthopedics;  Laterality: Right;  . Permanent pacemaker generator change N/A 06/17/2012    Procedure: PERMANENT PACEMAKER GENERATOR CHANGE;  Surgeon: Deboraha Sprang, MD;  Location: [facility] CATH LAB;  Service: Cardiovascular;  Laterality: N/A;  . Cardiac catheterization N/A 05/06/2015    Procedure: Left  Heart Cath and Coronary Angiography;  Surgeon: Belva Crome, MD;  Location: [place] CV LAB;  Service: Cardiovascular;  Laterality: N/A;    Current Outpatient Prescriptions  Medication Sig Dispense Refill  . apixaban (ELIQUIS) 5 MG TABS tablet Take 1 tablet (5 mg total) by mouth 2 (two) times daily. 180 tablet 2  . atorvastatin (LIPITOR) 80 MG tablet Take one tablet by  mouth daily 90 tablet 3  . CALCIUM PO Take 600 mg by mouth 2 (two) times daily.    . carvedilol (COREG) 6.25 MG tablet Take 6.25 mg by mouth 2 (two) times daily.  0  . clobetasol cream (TEMOVATE) AB-123456789 % Apply 1 application topically daily.    . Cyanocobalamin (VITAMIN B 12 PO) Take 1 tablet by mouth daily.    Marland Kitchen dofetilide (TIKOSYN) 500 MCG capsule Take 1 capsule by mouth  twice daily 180 capsule 1  . fenofibrate (TRICOR) 145 MG tablet Take 1 tablet by mouth  daily 90 tablet 3  . fexofenadine (ALLEGRA) 180 MG tablet Take 180 mg by mouth  daily.    Marland Kitchen FOLIC ACID PO Take 1 tablet by mouth daily.    Marland Kitchen GLUCOSAMINE CHONDROITIN COMPLX PO Take 1 tablet by mouth 2 (two) times daily.     Marland Kitchen losartan (COZAAR) 25 MG tablet Take 1 tablet (25 mg total) by mouth daily. 90 tablet 3  . Lysine 500 MG TABS Take 500 mg by mouth 3 (three) times daily.    . Magnesium 250 MG TABS Take 250 mg by mouth 2 (two) times daily.     . Omega-3 Fatty Acids (FISH OIL) 1200 MG CAPS Take 1,200 mg by mouth daily.     . potassium chloride (K-DUR,KLOR-CON) 10 MEQ tablet Take 1 tablet by mouth two  times daily 180 tablet 3  . tadalafil (CIALIS) 20 MG tablet Take 1 tablet (20 mg total) by mouth daily as needed. For ED 30 tablet 3  . triamcinolone (NASACORT) 55 MCG/ACT AERO nasal inhaler Place 2 sprays into both nostrils daily.     Marland Kitchen zolpidem (AMBIEN) 10 MG tablet Take 0.5 tablets (5 mg total) by mouth at bedtime as needed for sleep. 90 tablet 1   No current facility-administered medications for this visit.    Allergies as of 09/29/2015 - Review Complete 09/29/2015  Allergen Reaction Noted  . Amiodarone  01/07/2008  . Niacin    . Ace inhibitors  01/07/2008  . Mometasone furoate Other (See Comments) 02/21/2011    Family History  Problem Relation Age of Onset  . Diabetes Other     family hx  . Sleep apnea Other     family hx  . Diabetes Mother   . Heart disease Mother     Before age 36  . Heart disease Father     Before age 15  . Heart attack Father   . Stroke Father   . Aneurysm Father     bilat pop art aneurysms  . Peripheral vascular disease Father     Popliteal Aneurysm    Social History   Social History  . Marital Status: Married    Spouse Name: N/A  . Number of Children: N/A  . Years of Education: N/A   Occupational History  . Not on file.   Social History Main Topics  . Smoking status: Never Smoker   . Smokeless tobacco: Never Used  . Alcohol Use: No     Comment: nothing since 2000  . Drug Use: No  .  Sexual Activity: Yes    Other Topics Concern  . 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      Physical Exam: BP 104/50 mmHg  Pulse 76  Ht 6' 2.8" (1.9 m)  Wt 216 lb 6 oz (98.147 kg)  BMI 27.19 kg/m2 Constitutional: generally well-appearing Psychiatric: alert and oriented x3 Abdomen: soft, nontender, nondistended, no obvious ascites, no peritoneal signs, normal bowel sounds Rectal examination: 1 cm left sided external anal hemorrhoid that is clearly thrombosed. This is not tender or painful there is no fluctuance nearby it is not erythematous.  Assessment and plan: 75 y.o. male with somewhat bothersome external anal hemorrhoid thrombosed  I'm glad he has very few symptoms from his hemorrhoid. It does sound like this started after he was sitting on the toilet for about an hour several weeks ago. I advised him to try to do that any further. He is going to bulk his stools with fiber supplements and is going to use sitz baths twice daily until the thrombosis resolves. He is going to communicate the results of the above with me in a month or 2, probably by my chart.   Owens Loffler, MD [facility] 09/29/2015, 2:35 PM

## 2015-10-03 DIAGNOSIS — J019 Acute sinusitis, unspecified: Secondary | ICD-10-CM | POA: Diagnosis not present

## 2015-10-03 DIAGNOSIS — B9689 Other specified bacterial agents as the cause of diseases classified elsewhere: Secondary | ICD-10-CM | POA: Diagnosis not present

## 2015-10-05 ENCOUNTER — Encounter: Payer: Self-pay | Admitting: Internal Medicine

## 2015-10-07 ENCOUNTER — Other Ambulatory Visit: Payer: Self-pay | Admitting: *Deleted

## 2015-10-07 MED ORDER — FENOFIBRATE 160 MG PO TABS: 160.0000 mg | ORAL_TABLET | Freq: Every day | ORAL | Status: DC

## 2015-11-04 ENCOUNTER — Encounter: Payer: Self-pay | Admitting: Internal Medicine

## 2015-11-05 ENCOUNTER — Other Ambulatory Visit: Payer: Self-pay | Admitting: *Deleted

## 2015-11-05 MED ORDER — CARVEDILOL 6.25 MG PO TABS: 6.2500 mg | ORAL_TABLET | Freq: Two times a day (BID) | ORAL | Status: DC

## 2015-11-17 ENCOUNTER — Encounter: Payer: Medicare Other | Admitting: Internal Medicine

## 2015-11-23 ENCOUNTER — Other Ambulatory Visit: Payer: Self-pay | Admitting: Internal Medicine

## 2015-11-24 ENCOUNTER — Ambulatory Visit (HOSPITAL_COMMUNITY): Payer: Medicare Other | Attending: Cardiology

## 2015-11-24 ENCOUNTER — Other Ambulatory Visit: Payer: Self-pay

## 2015-11-24 DIAGNOSIS — I340 Nonrheumatic mitral (valve) insufficiency: Secondary | ICD-10-CM | POA: Insufficient documentation

## 2015-11-24 DIAGNOSIS — E785 Hyperlipidemia, unspecified: Secondary | ICD-10-CM | POA: Diagnosis not present

## 2015-11-24 DIAGNOSIS — I77810 Thoracic aortic ectasia: Secondary | ICD-10-CM | POA: Diagnosis not present

## 2015-11-24 DIAGNOSIS — I358 Other nonrheumatic aortic valve disorders: Secondary | ICD-10-CM | POA: Insufficient documentation

## 2015-11-24 DIAGNOSIS — I255 Ischemic cardiomyopathy: Secondary | ICD-10-CM | POA: Diagnosis not present

## 2015-11-24 DIAGNOSIS — I119 Hypertensive heart disease without heart failure: Secondary | ICD-10-CM | POA: Insufficient documentation

## 2015-11-24 DIAGNOSIS — I429 Cardiomyopathy, unspecified: Secondary | ICD-10-CM | POA: Diagnosis present

## 2015-12-02 ENCOUNTER — Encounter: Payer: Self-pay | Admitting: Internal Medicine

## 2015-12-02 ENCOUNTER — Ambulatory Visit (INDEPENDENT_AMBULATORY_CARE_PROVIDER_SITE_OTHER): Payer: Medicare Other | Admitting: Internal Medicine

## 2015-12-02 VITALS — BP 120/68 | HR 60 | Ht 74.0 in | Wt 220.2 lb

## 2015-12-02 DIAGNOSIS — I428 Other cardiomyopathies: Secondary | ICD-10-CM

## 2015-12-02 DIAGNOSIS — I495 Sick sinus syndrome: Secondary | ICD-10-CM | POA: Diagnosis not present

## 2015-12-02 DIAGNOSIS — Z950 Presence of cardiac pacemaker: Secondary | ICD-10-CM

## 2015-12-02 DIAGNOSIS — I480 Paroxysmal atrial fibrillation: Secondary | ICD-10-CM | POA: Diagnosis not present

## 2015-12-02 DIAGNOSIS — I429 Cardiomyopathy, unspecified: Secondary | ICD-10-CM

## 2015-12-02 LAB — CUP PACEART INCLINIC DEVICE CHECK
Date Time Interrogation Session: 20170323160022
Implantable Lead Implant Date: 19980115
Lead Channel Impedance Value: 351 Ohm
Lead Channel Pacing Threshold Amplitude: 0.8 V
Lead Channel Pacing Threshold Amplitude: 1 V
Lead Channel Pacing Threshold Pulse Width: 0.4 ms
Lead Channel Sensing Intrinsic Amplitude: 4.3 mV
Lead Channel Setting Pacing Amplitude: 1.8 V
Lead Channel Setting Pacing Pulse Width: 0.4 ms
MDC IDC LEAD IMPLANT DT: 19980115
MDC IDC LEAD LOCATION: 753859
MDC IDC LEAD LOCATION: 753860
MDC IDC MSMT LEADCHNL RA IMPEDANCE VALUE: 331 Ohm
MDC IDC MSMT LEADCHNL RA PACING THRESHOLD AMPLITUDE: 0.8 V
MDC IDC MSMT LEADCHNL RA PACING THRESHOLD PULSEWIDTH: 0.4 ms
MDC IDC MSMT LEADCHNL RA PACING THRESHOLD PULSEWIDTH: 0.4 ms
MDC IDC MSMT LEADCHNL RV PACING THRESHOLD AMPLITUDE: 1 V
MDC IDC MSMT LEADCHNL RV PACING THRESHOLD PULSEWIDTH: 0.4 ms
MDC IDC SET LEADCHNL RV PACING AMPLITUDE: 2 V
MDC IDC STAT BRADY RA PERCENT PACED: 57 %
MDC IDC STAT BRADY RV PERCENT PACED: 4 %
Pulse Gen Serial Number: 66339555

## 2015-12-02 MED ORDER — CARVEDILOL 12.5 MG PO TABS: 12.5000 mg | ORAL_TABLET | Freq: Two times a day (BID) | ORAL | Status: DC

## 2015-12-02 NOTE — Progress Notes (Signed)
Patient Care Team: Biagio Borg, MD as PCP - General (Internal Medicine) Deboraha Sprang, MD (Cardiology) Melrose Nakayama, MD as Consulting Physician (Orthopedic Surgery)   HPI  [name] is a 75 y.o. 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   His pacemaker reached ERI 8/13 and he underwent generator replacement with a Biotronik device because of his CLS function  He takes dofetilide for his atrial fibrillation  Potassium levels were normal to weeks ago. Magnesium levels will be checked.  At his last visit in August he was noted to have new left bundle branch block. Assessment of LV function surprisingly demonstrated significant impairment in LV function 30-35% and he subsequently underwent catheterization demonstrating modest nonobstructive disease confirming LV dysfunction  >>Diffuse noncritical coronary disease involving each of the 3 major coronaries. Both the mid RCA and mid LAD contained up to 65% stenosis. The previously placed proximal LAD stent is widely patent. The small first diagonal contains 90% stenosis and is jailed by the stent.  Echocardiogram 11/24/15 demonstrated mild (?) Interval improvement with an ejection fraction of 35-40%. At that juncture his calcium blocker was switched to a beta blocker.  The patient denies    chest pain, shortness of breath, nocturnal dyspnea, orthopnea or peripheral edema.  There have been scant  Palpitations, but no lightheadedness or syncope.   He does have a dryness his cough which dates back about 30 years. He is quite distinct from his PVCs.     Past Medical History  Diagnosis Date  . Hyperlipidemia     takes Tricor and Lipitor daily  . Coronary artery disease     LAD stenting 2004 non-DES  . Atrial fibrillation ([place])     takes Tikosyn and Eliquis daily  . Peripheral vascular disease ([place])   .  Allergic rhinitis     takes Allegra daily  . Arthritis   . 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 Pacemaker   . Allergic rhinitis, cause unspecified 06/22/2012  . Insomnia     takes Ambien nightly as needed  . OSA (obstructive sleep apnea)     CPAP  . Pleurisy     early 68's  . 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 Joint pain   . Joint swelling   . Dysphagia     occasionally  . History of colon polyps   . Diverticulosis   . Urinary urgency   . Depression     took Zoloft 70yrs ago but nothing now  . History of blood clots 2003    left leg prior to fem pop  . DVT (deep venous thrombosis) [facility])     Past Surgical History  Procedure Laterality Date  . Inguinal hernia repair Right   . Stent (unspec)      x2  . Pacemaker insertion      due to bradycardia  . Right and left fem-pop bypass    . Hand surgery Left   . Left knee surgery    . Cardiac catheterization  2004  . Hand and arm surgery Right     as a teenager  . Turbinate reduction    . Wisdom extracted     . Colonoscopy    . Total hip arthroplasty Left 12/09/2013    Procedure: TOTAL HIP ARTHROPLASTY ANTERIOR APPROACH;  Surgeon: Collier Salina  Autumn Patty, MD;  Location: [place];  Service: Orthopedics;  Laterality: Left;  left anterior total hip arthroplasty  . Joint replacement Left December 09, 2013    Left Hip   . Joint replacement Right Aug. 25, 2015    Right Hip  . Total hip arthroplasty Right 05/05/2014    Procedure: TOTAL HIP ARTHROPLASTY ANTERIOR APPROACH;  Surgeon: Hessie Dibble, MD;  Location: [place];  Service: Orthopedics;  Laterality: Right;  . Permanent pacemaker generator change N/A 06/17/2012    Procedure: PERMANENT PACEMAKER GENERATOR CHANGE;  Surgeon: Deboraha Sprang, MD;  Location: [facility] CATH LAB;  Service: Cardiovascular;  Laterality: N/A;  . Cardiac catheterization N/A 05/06/2015    Procedure: Left Heart Cath and Coronary Angiography;  Surgeon: Belva Crome, MD;  Location: [place]  CV LAB;  Service: Cardiovascular;  Laterality: N/A;    Current Outpatient Prescriptions  Medication Sig Dispense Refill  . atorvastatin (LIPITOR) 80 MG tablet Take one tablet by  mouth daily 90 tablet 3  . CALCIUM PO Take 600 mg by mouth 2 (two) times daily.    . carvedilol (COREG) 6.25 MG tablet Take 1 tablet (6.25 mg total) by mouth 2 (two) times daily. 60 tablet 3  . clobetasol cream (TEMOVATE) AB-123456789 % Apply 1 application topically daily.    . Cyanocobalamin (VITAMIN B 12 PO) Take 1 tablet by mouth daily.    Marland Kitchen dofetilide (TIKOSYN) 500 MCG capsule Take 1 capsule by mouth  twice daily 180 capsule 0  . ELIQUIS 5 MG TABS tablet Take 1 tablet by mouth two  times daily 180 tablet 0  . fenofibrate 160 MG tablet Take 1 tablet (160 mg total) by mouth daily. 90 tablet 3  . fexofenadine (ALLEGRA) 180 MG tablet Take 180 mg by mouth daily.    Marland Kitchen FOLIC ACID PO Take 1 tablet by mouth daily.    Marland Kitchen GLUCOSAMINE CHONDROITIN COMPLX PO Take 1 tablet by mouth 2 (two) times daily.     Marland Kitchen losartan (COZAAR) 25 MG tablet Take 1 tablet (25 mg total) by mouth daily. 90 tablet 3  . Lysine 500 MG TABS Take 500 mg by mouth 3 (three) times daily.    . Magnesium 250 MG TABS Take 250 mg by mouth 2 (two) times daily.     . Omega-3 Fatty Acids (FISH OIL) 1200 MG CAPS Take 1,200 mg by mouth daily.     . potassium chloride (K-DUR,KLOR-CON) 10 MEQ tablet Take 1 tablet by mouth two  times daily 180 tablet 3  . tadalafil (CIALIS) 20 MG tablet Take 1 tablet (20 mg total) by mouth daily as needed. For ED 30 tablet 3  . triamcinolone (NASACORT) 55 MCG/ACT AERO nasal inhaler Place 2 sprays into both nostrils daily.     Marland Kitchen zolpidem (AMBIEN) 10 MG tablet Take 0.5 tablets (5 mg total) by mouth at bedtime as needed for sleep. 90 tablet 1   No current facility-administered medications for this visit.    Allergies  Allergen Reactions  . Amiodarone     REACTION: peripheral neuropathy  . Niacin     REACTION: irregular heartbeat  . Ace  Inhibitors     REACTION: cough  . Mometasone Furoate Other (See Comments)    (Nasonex) Causes nasal bleeding and nose bleeds    Review of Systems negative except from HPI and PMH  Physical Exam There were no vitals taken for this visit. Well developed and well nourished in no acute distress HENT normal E scleral  and icterus clear Neck Supple JVP flat; carotids brisk and full Clear to ausculation  *Regular rate and rhythm, no murmurs gallops or rub Soft with active bowel sounds No clubbing cyanosis non-pitting and none Edema Alert and oriented, grossly normal motor and sensory function Skin Warm and Dry  ECG demonstrates atrial paced at 71 Intervals 14/13/46 Left bundle branch block   Assessment and  Plan  Atrial fibrillation-paroxysmal  Symptomatic ventricular pacing  Pacemaker Biotronik The patient's device was interrogated and the information was fully reviewed.  The device was reprogrammed as above  LBBB -question rate related  Nonischemic cardiac myopathy  Coronary artery disease A) prior stenting B) diffuse nonobstructive disease   There is been interval improvement-albeit slowly and LV function. We will further uptitrate his carvedilol 6.25--9.375 and then 12.5.  His branch block is relatively slight this point may explain contributing to his cardiomyopathy his symptoms and his ejection fraction atintervention to address his left) either as relates to his cardiomyopathy heart failure symptoms or heart failure symptoms  We will plan to reassess LV function in about 3 months.  We spent more than 50% of our >25 min visit in face to face counseling regarding the above

## 2015-12-02 NOTE — Patient Instructions (Addendum)
Medication Instructions:  Your physician has recommended you make the following change in your medication:  1) INCREASE Carvedilol to 9.375 mg twice daily  (prescription sent for 12.5 mg - but we discussed instructions for that)  Labwork: None ordered  Testing/Procedures: Your physician has requested that you have an echocardiogram in 3 months. Echocardiography is a painless test that uses sound waves to create images of your heart. It provides your doctor with information about the size and shape of your heart and how well your heart's chambers and valves are working. This procedure takes approximately one hour. There are no restrictions for this procedure.  Follow-Up: Your physician wants you to follow-up in: 6 months with Dr Caryl Comes.  You will receive a reminder letter in the mail two months in advance. If you don't receive a letter, please call our office to schedule the follow-up appointment.  If you need a refill on your cardiac medications before your next appointment, please call your pharmacy.  Thank you for choosing [facility]!!

## 2015-12-16 ENCOUNTER — Other Ambulatory Visit: Payer: Self-pay | Admitting: Internal Medicine

## 2016-02-28 ENCOUNTER — Ambulatory Visit (HOSPITAL_COMMUNITY): Payer: Medicare Other | Attending: Internal Medicine

## 2016-02-28 ENCOUNTER — Other Ambulatory Visit: Payer: Self-pay

## 2016-02-28 DIAGNOSIS — I340 Nonrheumatic mitral (valve) insufficiency: Secondary | ICD-10-CM | POA: Insufficient documentation

## 2016-02-28 DIAGNOSIS — I428 Other cardiomyopathies: Secondary | ICD-10-CM | POA: Insufficient documentation

## 2016-02-28 DIAGNOSIS — I495 Sick sinus syndrome: Secondary | ICD-10-CM

## 2016-02-28 DIAGNOSIS — I447 Left bundle-branch block, unspecified: Secondary | ICD-10-CM | POA: Diagnosis not present

## 2016-02-28 DIAGNOSIS — I2510 Atherosclerotic heart disease of native coronary artery without angina pectoris: Secondary | ICD-10-CM | POA: Diagnosis not present

## 2016-02-28 DIAGNOSIS — I358 Other nonrheumatic aortic valve disorders: Secondary | ICD-10-CM | POA: Diagnosis not present

## 2016-02-28 DIAGNOSIS — G4733 Obstructive sleep apnea (adult) (pediatric): Secondary | ICD-10-CM | POA: Insufficient documentation

## 2016-02-28 DIAGNOSIS — I480 Paroxysmal atrial fibrillation: Secondary | ICD-10-CM | POA: Diagnosis not present

## 2016-02-28 DIAGNOSIS — I4891 Unspecified atrial fibrillation: Secondary | ICD-10-CM | POA: Diagnosis not present

## 2016-02-28 LAB — ECHOCARDIOGRAM COMPLETE
AOASC: 35 cm
AV Area VTI index: 1.12 cm2/m2
AV Area VTI: 2.45 cm2
AV Mean grad: 7 mmHg
AV Peak grad: 13 mmHg
AV peak Index: 1.08
AVAREAMEANV: 2.48 cm2
AVAREAMEANVIN: 1.1 cm2/m2
AVCELMEANRAT: 0.43
AVPKVEL: 180 cm/s
Ao pk vel: 0.43 m/s
CHL CUP AV VALUE AREA INDEX: 1.12
CHL CUP AV VEL: 2.54
CHL CUP DOP CALC LVOT VTI: 16.8 cm
DOP CAL AO MEAN VELOCITY: 128 cm/s
E/e' ratio: 14.22
EWDT: 325 ms
FS: 38 % (ref 28–44)
IV/PV OW: 0.69
LA diam index: 1.64 cm/m2
LA vol A4C: 38.2 ml
LA vol: 51.2 mL
LASIZE: 37 mm
LAVOLIN: 22.7 mL/m2
LEFT ATRIUM END SYS DIAM: 37 mm
LV E/e' medial: 14.22
LV E/e'average: 14.22
LV e' LATERAL: 3.48 cm/s
LVOT SV: 96 mL
LVOT area: 5.73 cm2
LVOTD: 27 mm
LVOTPV: 76.9 cm/s
LVOTVTI: 0.44 cm
MV Dec: 325
MVPKAVEL: 94.6 m/s
MVPKEVEL: 49.5 m/s
PW: 11.4 mm — AB (ref 0.6–1.1)
RV TAPSE: 30 mm
Reg peak vel: 274 cm/s
TDI e' lateral: 3.48
TDI e' medial: 4.35
TRMAXVEL: 274 cm/s
VTI: 37.9 cm
Valve area: 2.54 cm2

## 2016-03-02 ENCOUNTER — Other Ambulatory Visit: Payer: Self-pay | Admitting: Internal Medicine

## 2016-03-03 ENCOUNTER — Encounter: Payer: Self-pay | Admitting: Internal Medicine

## 2016-03-08 ENCOUNTER — Telehealth: Payer: Self-pay | Admitting: *Deleted

## 2016-03-08 NOTE — Telephone Encounter (Signed)
SPOKE TO PT

## 2016-03-10 ENCOUNTER — Other Ambulatory Visit: Payer: Self-pay | Admitting: *Deleted

## 2016-03-10 MED ORDER — CARVEDILOL 25 MG PO TABS: 25.0000 mg | ORAL_TABLET | Freq: Two times a day (BID) | ORAL | Status: DC

## 2016-04-07 ENCOUNTER — Encounter: Payer: Medicare Other | Admitting: Internal Medicine

## 2016-04-12 ENCOUNTER — Other Ambulatory Visit: Payer: Self-pay | Admitting: Internal Medicine

## 2016-04-19 ENCOUNTER — Encounter: Payer: Self-pay | Admitting: Internal Medicine

## 2016-04-19 ENCOUNTER — Ambulatory Visit (INDEPENDENT_AMBULATORY_CARE_PROVIDER_SITE_OTHER)
Admission: RE | Admit: 2016-04-19 | Discharge: 2016-04-19 | Disposition: A | Discharge status: Home / Self Care | Payer: Medicare Other | Source: Ambulatory Visit | Attending: Internal Medicine | Admitting: Internal Medicine

## 2016-04-19 ENCOUNTER — Other Ambulatory Visit (INDEPENDENT_AMBULATORY_CARE_PROVIDER_SITE_OTHER): Payer: Medicare Other

## 2016-04-19 ENCOUNTER — Ambulatory Visit (INDEPENDENT_AMBULATORY_CARE_PROVIDER_SITE_OTHER): Payer: Medicare Other | Admitting: Internal Medicine

## 2016-04-19 ENCOUNTER — Other Ambulatory Visit: Payer: Self-pay | Admitting: Internal Medicine

## 2016-04-19 VITALS — BP 114/60 | HR 68 | Temp 98.1°F | Resp 16 | Ht 74.0 in | Wt 216.0 lb

## 2016-04-19 DIAGNOSIS — J019 Acute sinusitis, unspecified: Secondary | ICD-10-CM

## 2016-04-19 DIAGNOSIS — R05 Cough: Secondary | ICD-10-CM

## 2016-04-19 DIAGNOSIS — L819 Disorder of pigmentation, unspecified: Secondary | ICD-10-CM | POA: Diagnosis not present

## 2016-04-19 DIAGNOSIS — R6889 Other general symptoms and signs: Secondary | ICD-10-CM

## 2016-04-19 DIAGNOSIS — J329 Chronic sinusitis, unspecified: Secondary | ICD-10-CM | POA: Diagnosis not present

## 2016-04-19 DIAGNOSIS — R739 Hyperglycemia, unspecified: Secondary | ICD-10-CM

## 2016-04-19 DIAGNOSIS — Z0001 Encounter for general adult medical examination with abnormal findings: Secondary | ICD-10-CM

## 2016-04-19 DIAGNOSIS — I5022 Chronic systolic (congestive) heart failure: Secondary | ICD-10-CM

## 2016-04-19 DIAGNOSIS — R059 Cough, unspecified: Secondary | ICD-10-CM

## 2016-04-19 DIAGNOSIS — Z0000 Encounter for general adult medical examination without abnormal findings: Secondary | ICD-10-CM

## 2016-04-19 LAB — CBC WITH DIFFERENTIAL/PLATELET
Basophils Absolute: 0 10*3/uL (ref 0.0–0.1)
Basophils Relative: 0.5 % (ref 0.0–3.0)
EOS PCT: 6.4 % — AB (ref 0.0–5.0)
Eosinophils Absolute: 0.4 10*3/uL (ref 0.0–0.7)
HCT: 41.4 % (ref 39.0–52.0)
Hemoglobin: 14.1 g/dL (ref 13.0–17.0)
LYMPHS ABS: 1.7 10*3/uL (ref 0.7–4.0)
Lymphocytes Relative: 28.7 % (ref 12.0–46.0)
MCHC: 34 g/dL (ref 30.0–36.0)
MCV: 95 fl (ref 78.0–100.0)
MONOS PCT: 10.2 % (ref 3.0–12.0)
Monocytes Absolute: 0.6 10*3/uL (ref 0.1–1.0)
NEUTROS ABS: 3.3 10*3/uL (ref 1.4–7.7)
NEUTROS PCT: 54.2 % (ref 43.0–77.0)
Platelets: 189 10*3/uL (ref 150.0–400.0)
RBC: 4.35 Mil/uL (ref 4.22–5.81)
RDW: 14 % (ref 11.5–15.5)
WBC: 6.1 10*3/uL (ref 4.0–10.5)

## 2016-04-19 LAB — URINALYSIS, ROUTINE W REFLEX MICROSCOPIC
BILIRUBIN URINE: NEGATIVE
HGB URINE DIPSTICK: NEGATIVE
Ketones, ur: NEGATIVE
Leukocytes, UA: NEGATIVE
NITRITE: NEGATIVE
Specific Gravity, Urine: 1.02 (ref 1.000–1.030)
Total Protein, Urine: NEGATIVE
Urine Glucose: NEGATIVE
Urobilinogen, UA: 0.2 (ref 0.0–1.0)
pH: 6.5 (ref 5.0–8.0)

## 2016-04-19 LAB — HEMOGLOBIN A1C: Hgb A1c MFr Bld: 5.5 % (ref 4.6–6.5)

## 2016-04-19 MED ORDER — METHYLPREDNISOLONE ACETATE 80 MG/ML IJ SUSP
80.0000 mg | Freq: Once | INTRAMUSCULAR | Status: AC
  Administered 2016-04-19: 80 mg via INTRAMUSCULAR

## 2016-04-19 MED ORDER — AZITHROMYCIN 250 MG PO TABS: ORAL_TABLET | ORAL | 1 refills | Status: DC

## 2016-04-19 MED ORDER — FLUOCINONIDE-E 0.05 % EX CREA: 1.0000 "application " | TOPICAL_CREAM | Freq: Two times a day (BID) | CUTANEOUS | 1 refills | Status: DC | PRN

## 2016-04-19 NOTE — Progress Notes (Signed)
Pre visit review using our clinic review tool, if applicable. No additional management support is needed unless otherwise documented below in the visit note. 

## 2016-04-19 NOTE — Progress Notes (Signed)
Subjective:    Patient ID: [name], male    DOB: May 29, 1941, 75 y.o.   MRN: [id number]  HPI  Here for wellness and f/u;  Overall doing ok;  Pt denies Chest pain, worsening SOB, DOE, wheezing, orthopnea, PND, worsening LE edema, palpitations, dizziness or syncope.  Pt denies neurological change such as new headache, facial or extremity weakness.  Pt denies polydipsia, polyuria, or low sugar symptoms. Pt states overall good compliance with treatment and medications, good tolerability, and has been trying to follow appropriate diet.  Pt denies worsening depressive symptoms, suicidal ideation or panic. No fever, night sweats, wt loss, loss of appetite, or other constitutional symptoms.  Pt states good ability with ADL's, has low fall risk, home safety reviewed and adequate, no other significant changes in hearing or vision, and only occasionally active with exercise.  Also has cough somewhat worse recently, seems to have flares of sinus issues every 6-9 months, usaully better with steroid shot/predpac and antibx at [facility], last cxr mar 2015 - NAD.  Has seen ENT Dr Lucia Gaskins, CT approx 18 mo ago neg for infection at that point.   Here with 2-3 days acute onset fever, facial pain, pressure, headache, general weakness and malaise, and greenish d/c  Also has worsening rash to medial distal LLE , improved prior with fluocinnide for the right leg prevoiusly Past Medical History:  Diagnosis Date  . Allergic rhinitis    takes Allegra daily  . Allergic rhinitis, cause unspecified 06/22/2012  . Arthritis   . Atrial fibrillation ([place])    takes Tikosyn and Eliquis daily  . 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 Coronary artery disease    LAD stenting 2004 non-DES  . Depression    took Zoloft 12yrs ago but nothing now  . Diverticulosis   . DVT (deep venous thrombosis) ([place])   . Dysphagia    occasionally  . History of blood clots 2003   left leg prior to fem pop  . History of colon polyps   .  Hyperlipidemia    takes Tricor and Lipitor daily  . Insomnia    takes Ambien nightly as needed  . Joint pain   . Joint swelling   . 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 OSA (obstructive sleep apnea)    CPAP  . Pacemaker   . Peripheral vascular disease ([place])   . Pleurisy    early 7's  . Urinary urgency    Past Surgical History:  Procedure Laterality Date  . CARDIAC CATHETERIZATION  2004  . CARDIAC CATHETERIZATION N/A 05/06/2015   Procedure: Left Heart Cath and Coronary Angiography;  Surgeon: Belva Crome, MD;  Location: [place] [facility];  Service: Cardiovascular;  Laterality: N/A;  . COLONOSCOPY    . hand and arm surgery Right    as a teenager  . HAND SURGERY Left   . INGUINAL HERNIA REPAIR Right   . JOINT REPLACEMENT Left December 09, 2013   Left Hip   . JOINT REPLACEMENT Right Aug. 25, 2015   Right Hip  . left knee surgery    . PACEMAKER INSERTION     due to bradycardia  . PERMANENT PACEMAKER GENERATOR CHANGE N/A 06/17/2012   Procedure: PERMANENT PACEMAKER GENERATOR CHANGE;  Surgeon: Deboraha Sprang, MD;  Location: [facility] CATH LAB;  Service: Cardiovascular;  Laterality: N/A;  . right and left fem-pop bypass    . stent (unspec)  x2  . TOTAL HIP ARTHROPLASTY Left 12/09/2013   Procedure: TOTAL HIP ARTHROPLASTY ANTERIOR APPROACH;  Surgeon: Hessie Dibble, MD;  Location: [facility];  Service: Orthopedics;  Laterality: Left;  left anterior total hip arthroplasty  . TOTAL HIP ARTHROPLASTY Right 05/05/2014   Procedure: TOTAL HIP ARTHROPLASTY ANTERIOR APPROACH;  Surgeon: Hessie Dibble, MD;  Location: [place];  Service: Orthopedics;  Laterality: Right;  . TURBINATE REDUCTION    . wisdom extracted       reports that he has never smoked. He has never used smokeless tobacco. He reports that he does not drink alcohol or use drugs. family history includes Aneurysm in his father; Diabetes in his mother and other; Heart attack in his father; Heart disease in his father  and mother; Peripheral vascular disease in his father; Sleep apnea in his other; Stroke in his father. Allergies  Allergen Reactions  . Amiodarone     REACTION: peripheral neuropathy  . Niacin     REACTION: irregular heartbeat  . Ace Inhibitors     REACTION: cough  . Mometasone Furoate Other (See Comments)    (Nasonex) Causes nasal bleeding and nose bleeds   Current Outpatient Prescriptions on File Prior to Visit  Medication Sig Dispense Refill  . atorvastatin (LIPITOR) 80 MG tablet Take one tablet by  mouth daily 90 tablet 3  . CALCIUM PO Take 600 mg by mouth 2 (two) times daily.    . carvedilol (COREG) 25 MG tablet Take 1 tablet (25 mg total) by mouth 2 (two) times daily. 180 tablet 3  . Cyanocobalamin (VITAMIN B 12 PO) Take 1 tablet by mouth daily.    Marland Kitchen dofetilide (TIKOSYN) 500 MCG capsule Take 1 capsule by mouth  twice daily 180 capsule 2  . ELIQUIS 5 MG TABS tablet Take 1 tablet by mouth two  times daily 180 tablet 2  . fenofibrate 160 MG tablet Take 1 tablet (160 mg total) by mouth daily. 90 tablet 3  . fexofenadine (ALLEGRA) 180 MG tablet Take 180 mg by mouth daily.    Marland Kitchen FOLIC ACID PO Take 1 tablet by mouth daily.    Marland Kitchen GLUCOSAMINE CHONDROITIN COMPLX PO Take 1 tablet by mouth 2 (two) times daily.     Marland Kitchen Lysine 500 MG TABS Take 500 mg by mouth 3 (three) times daily.    . Magnesium 250 MG TABS Take 250 mg by mouth 2 (two) times daily.     . Omega-3 Fatty Acids (FISH OIL) 1200 MG CAPS Take 1,200 mg by mouth daily.     . potassium chloride (K-DUR,KLOR-CON) 10 MEQ tablet Take 1 tablet by mouth two  times daily 180 tablet 1  . triamcinolone (NASACORT) 55 MCG/ACT AERO nasal inhaler Place 2 sprays into both nostrils daily.     Marland Kitchen zolpidem (AMBIEN) 10 MG tablet Take 0.5 tablets (5 mg total) by mouth at bedtime as needed for sleep. 90 tablet 1   No current facility-administered medications on file prior to visit.     Review of Systems Constitutional: Negative for increased diaphoresis,  or other activity, appetite or siginficant weight change other than noted HENT: Negative for worsening hearing loss, ear pain, facial swelling, mouth sores and neck stiffness.   Eyes: Negative for other worsening pain, redness or visual disturbance.  Respiratory: Negative for choking or stridor Cardiovascular: Negative for other chest pain and palpitations.  Gastrointestinal: Negative for worsening diarrhea, blood in stool, or abdominal distention Genitourinary: Negative for hematuria, flank pain or change in  urine volume.  Musculoskeletal: Negative for myalgias or other joint complaints.  Skin: Negative for other color change and wound or drainage.  Neurological: Negative for syncope and numbness. other than noted Hematological: Negative for adenopathy. or other swelling Psychiatric/Behavioral: Negative for hallucinations, SI, self-injury, decreased concentration or other worsening agitation.      Objective:   Physical Exam BP 114/60   Pulse 68   Temp 98.1 F (36.7 C) (Oral)   Resp 16   Ht 6\' 2"  (1.88 m)   Wt 216 lb (98 kg)   SpO2 98%   BMI 27.73 kg/m  VS noted, mild ill Constitutional: Pt is oriented to person, place, and time. Appears well-developed and well-nourished, in no significant distress Head: Normocephalic and atraumatic  Eyes: Conjunctivae and EOM are normal. Pupils are equal, round, and reactive to light Right Ear: External ear normal.  Left Ear: External ear normal Nose: Nose normal.  Mouth/Throat: Oropharynx is clear and moist  Bilat tm's with mild erythema.  Max sinus areas mild tender.  Pharynx with mild erythema, no exudate Neck: Normal range of motion. Neck supple. No JVD present. No tracheal deviation present or significant neck LA or mass Cardiovascular: Normal rate, regular rhythm, normal heart sounds and intact distal pulses.   Pulmonary/Chest: Effort normal and breath sounds without rales or wheezing  Abdominal: Soft. Bowel sounds are normal. NT. No HSM    Musculoskeletal: Normal range of motion. Exhibits no edema Lymphadenopathy: Has no cervical adenopathy.  Neurological: Pt is alert and oriented to person, place, and time. Pt has normal reflexes. No cranial nerve deficit. Motor grossly intact Skin: Skin is warm and dry. No rash noted or new ulcers Psychiatric:  Has normal mood and affect. Behavior is normal.     Most recent echo June 2017 summary Study Conclusions  - Left ventricle: The cavity size was normal. Wall thickness was   normal. Systolic function was mildly to moderately reduced. The   estimated ejection fraction was in the range of 40% to 45%. - Aortic valve: AV is moderately thickened, calcified with   minimally restricted motion. - Mitral valve: There was mild regurgitation. - Pulmonary arteries: PA peak pressure: 33 mm Hg (S).    Assessment & Plan:  The test results show that your current treatment is OK.  There is no need for change of treatment or further evaluation based on these results, which are normal or stable. Any information shown as abnormal is not felt to be significant at this time, and can be reviewed at your next visit.  Please continue the same plan for further evaluation and treatment as discussed.

## 2016-04-19 NOTE — Patient Instructions (Addendum)
Please take all new medication as prescribed - the fluocinonide cream, and the antibiotic  You had the steroid shot today  Please continue all other medications as before, and refills have been done if requested.  Please have the pharmacy call with any other refills you may need.  Please continue your efforts at being more active, low cholesterol diet, and weight control.  You are otherwise up to date with prevention measures today.  Please keep your appointments with your specialists as you may have planned  You will be contacted regarding the referral for: Allergy  Please go to the LAB in the Basement (turn left off the elevator) for the tests to be done today  You will be contacted by phone if any changes need to be made immediately.  Otherwise, you will receive a letter about your results with an explanation, but please check with MyChart first.  Please remember to sign up for MyChart if you have not done so, as this will be important to you in the future with finding out test results, communicating by private email, and scheduling acute appointments online when needed.  Please return in 1 year for your yearly visit, or sooner if needed, with Lab testing done 3-5 days before

## 2016-04-20 LAB — LIPID PANEL
CHOL/HDL RATIO: 3
Cholesterol: 117 mg/dL (ref 0–200)
HDL: 38 mg/dL — ABNORMAL LOW (ref >39.00)
LDL CALC: 61 mg/dL (ref 0–99)
NONHDL: 78.93
Triglycerides: 88 mg/dL (ref 0.0–149.0)
VLDL: 17.6 mg/dL (ref 0.0–40.0)

## 2016-04-20 LAB — BASIC METABOLIC PANEL
BUN: 19 mg/dL (ref 6–23)
CALCIUM: 10 mg/dL (ref 8.4–10.5)
CO2: 30 mEq/L (ref 19–32)
Chloride: 106 mEq/L (ref 96–112)
Creatinine, Ser: 1.28 mg/dL (ref 0.40–1.50)
GFR: 57.98 mL/min — AB (ref >60.00)
Glucose, Bld: 91 mg/dL (ref 70–99)
Potassium: 4.7 mEq/L (ref 3.5–5.1)
SODIUM: 142 meq/L (ref 135–145)

## 2016-04-20 LAB — HEPATIC FUNCTION PANEL
ALBUMIN: 4.5 g/dL (ref 3.5–5.2)
ALK PHOS: 44 U/L (ref 39–117)
ALT: 20 U/L (ref 0–53)
AST: 24 U/L (ref 0–37)
BILIRUBIN DIRECT: 0.2 mg/dL (ref 0.0–0.3)
Total Bilirubin: 0.7 mg/dL (ref 0.2–1.2)
Total Protein: 7.2 g/dL (ref 6.0–8.3)

## 2016-04-20 LAB — TSH: TSH: 2.16 u[IU]/mL (ref 0.35–4.50)

## 2016-04-20 LAB — PSA: PSA: 0.38 ng/mL (ref 0.10–4.00)

## 2016-04-25 NOTE — Assessment & Plan Note (Addendum)
Mild to mod, for antibx course,  to f/u any worsening symptoms or concerns  In addition to the time spent performing CPE, I spent an additional 25 minutes face to face,in which greater than 50% of this time was spent in counseling and coordination of care for patient's acute illness as documented.  

## 2016-04-25 NOTE — Assessment & Plan Note (Signed)
Asympt, stable overall by history and exam, recent data reviewed with pt, and pt to continue medical treatment as before,  to f/u any worsening symptoms or concerns Lab Results  Component Value Date   HGBA1C 5.5 04/19/2016

## 2016-04-25 NOTE — Assessment & Plan Note (Signed)
Also for steroid asd,  to f/u any worsening symptoms or concerns

## 2016-04-25 NOTE — Assessment & Plan Note (Signed)

## 2016-04-25 NOTE — Assessment & Plan Note (Signed)
stable overall by history and exam, and pt to continue medical treatment as before,  to f/u any worsening symptoms or concerns 

## 2016-04-25 NOTE — Assessment & Plan Note (Signed)
[place] for fluocinonide asd,  to f/u any worsening symptoms or concerns

## 2016-05-02 ENCOUNTER — Encounter (HOSPITAL_COMMUNITY): Payer: Medicare Other

## 2016-05-02 ENCOUNTER — Ambulatory Visit: Payer: Medicare Other | Admitting: Family

## 2016-05-02 ENCOUNTER — Other Ambulatory Visit (HOSPITAL_COMMUNITY): Payer: Medicare Other

## 2016-05-09 ENCOUNTER — Encounter: Payer: Self-pay | Admitting: Family

## 2016-05-17 ENCOUNTER — Other Ambulatory Visit: Payer: Self-pay | Admitting: Internal Medicine

## 2016-05-23 ENCOUNTER — Encounter: Payer: Self-pay | Admitting: Internal Medicine

## 2016-05-24 ENCOUNTER — Encounter: Payer: Self-pay | Admitting: Internal Medicine

## 2016-05-30 ENCOUNTER — Other Ambulatory Visit: Payer: Self-pay | Admitting: *Deleted

## 2016-05-30 DIAGNOSIS — I428 Other cardiomyopathies: Secondary | ICD-10-CM

## 2016-06-02 ENCOUNTER — Encounter: Payer: Self-pay | Admitting: Internal Medicine

## 2016-06-09 ENCOUNTER — Ambulatory Visit (HOSPITAL_COMMUNITY): Payer: Medicare Other

## 2016-06-09 ENCOUNTER — Ambulatory Visit: Payer: Medicare Other | Admitting: Family

## 2016-06-12 ENCOUNTER — Ambulatory Visit (HOSPITAL_COMMUNITY): Payer: Medicare Other | Attending: Cardiology

## 2016-06-12 ENCOUNTER — Other Ambulatory Visit: Payer: Self-pay

## 2016-06-12 DIAGNOSIS — I350 Nonrheumatic aortic (valve) stenosis: Secondary | ICD-10-CM | POA: Diagnosis not present

## 2016-06-12 DIAGNOSIS — I428 Other cardiomyopathies: Secondary | ICD-10-CM | POA: Diagnosis not present

## 2016-06-12 DIAGNOSIS — I340 Nonrheumatic mitral (valve) insufficiency: Secondary | ICD-10-CM | POA: Insufficient documentation

## 2016-06-12 DIAGNOSIS — I501 Left ventricular failure: Secondary | ICD-10-CM | POA: Diagnosis not present

## 2016-06-12 DIAGNOSIS — I371 Nonrheumatic pulmonary valve insufficiency: Secondary | ICD-10-CM | POA: Diagnosis not present

## 2016-06-12 DIAGNOSIS — I361 Nonrheumatic tricuspid (valve) insufficiency: Secondary | ICD-10-CM | POA: Diagnosis not present

## 2016-06-13 ENCOUNTER — Encounter: Payer: Medicare Other | Admitting: Internal Medicine

## 2016-06-15 ENCOUNTER — Telehealth: Payer: Self-pay

## 2016-06-15 NOTE — Telephone Encounter (Signed)
Pt is aware of results. Pt also received results via mychart and has some concerns. Pt feels that his EF shouldve improved form June ECHO seeing as though he has started a full dose of Carvedilol. Pt also has questions about why he is not able to see the values needed to calculate his EF for himself. He would like the numerical values needed to calculate his EF rather then him just receiving a percentage. Pt states he will voice concerns to Dr. Caryl Comes when he comes in for upcoming appt next week. I told him I would forward concerns to Dr. Caryl Comes either way to just give him the heads up. Pt was agreeable.

## 2016-06-20 ENCOUNTER — Ambulatory Visit (INDEPENDENT_AMBULATORY_CARE_PROVIDER_SITE_OTHER): Payer: Medicare Other | Admitting: Internal Medicine

## 2016-06-20 ENCOUNTER — Encounter: Payer: Self-pay | Admitting: Internal Medicine

## 2016-06-20 VITALS — BP 86/50 | HR 53 | Ht 75.0 in | Wt 218.8 lb

## 2016-06-20 DIAGNOSIS — I480 Paroxysmal atrial fibrillation: Secondary | ICD-10-CM | POA: Diagnosis not present

## 2016-06-20 DIAGNOSIS — Z950 Presence of cardiac pacemaker: Secondary | ICD-10-CM

## 2016-06-20 DIAGNOSIS — I447 Left bundle-branch block, unspecified: Secondary | ICD-10-CM | POA: Diagnosis not present

## 2016-06-20 DIAGNOSIS — I428 Other cardiomyopathies: Secondary | ICD-10-CM

## 2016-06-20 MED ORDER — CARVEDILOL 25 MG PO TABS: ORAL_TABLET | ORAL | 3 refills | Status: DC

## 2016-06-20 MED ORDER — CARVEDILOL 25 MG PO TABS: ORAL_TABLET | ORAL | Status: DC

## 2016-06-20 NOTE — Patient Instructions (Signed)
Medication Instructions: - Your physician has recommended you make the following change in your medication:  1) Increase coreg (carvedilol) to 25 mg two tablets (50 mg) by mouth twice daily  Labwork: - none ordered  Procedures/Testing: - Your physician has requested that you have an echocardiogram- in 6 months, just prior to follow up. Echocardiography is a painless test that uses sound waves to create images of your heart. It provides your doctor with information about the size and shape of your heart and how well your heart's chambers and valves are working. This procedure takes approximately one hour. There are no restrictions for this procedure.   Follow-Up: - Your physician wants you to follow-up in: 6 months with Dr. Caryl Comes- echo just prior You will receive a reminder letter in the mail two months in advance. If you don't receive a letter, please call our office to schedule the follow-up appointment.  Any Additional Special Instructions Will Be Listed Below (If Applicable).     If you need a refill on your cardiac medications before your next appointment, please call your pharmacy.

## 2016-06-20 NOTE — Addendum Note (Signed)
Addended by: Alvis Lemmings C on: 06/20/2016 05:46 PM   Modules accepted: Orders

## 2016-06-20 NOTE — Progress Notes (Signed)
Patient Care Team: Biagio Borg, MD as PCP - General (Internal Medicine) Deboraha Sprang, MD (Cardiology) Melrose Nakayama, MD as Consulting Physician (Orthopedic Surgery)   HPI  [name] is a 75 y.o. 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   His pacemaker reached ERI 8/13 and he underwent generator replacement with a Biotronik device because of his CLS function  He takes dofetilide for his atrial fibrillation  Potassium levels were normal to weeks ago. Magnesium levels will be checked.  At his last visit in August he was noted to have new left bundle branch block. Assessment of LV function surprisingly demonstrated significant impairment in LV function 30-35% and he subsequently underwent catheterization demonstrating modest nonobstructive disease confirming LV dysfunction  >>Diffuse noncritical coronary disease involving each of the 3 major coronaries. Both the mid RCA and mid LAD contained up to 65% stenosis. The previously placed proximal LAD stent is widely patent. The small first diagonal contains 90% stenosis and is jailed by the stent.     DATE TEST    8/16 Echo EF 30-35%   3/17 Echo EF 35-40%   6 /17  Echo   EF 40-45 %   10/17 Echo  EF 40-45  %      The patient denies    chest pain, shortness of breath, nocturnal dyspnea, orthopnea or peripheral edema.  There have been scant  Palpitations, but no lightheadedness or syncope.         Past Medical History:  Diagnosis Date  . Allergic rhinitis    takes Allegra daily  . Allergic rhinitis, cause unspecified 06/22/2012  . Arthritis   . Atrial fibrillation ([place])    takes Tikosyn and Eliquis daily  . 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 Coronary artery disease    LAD stenting 2004 non-DES  . Depression    took Zoloft 66yrs ago but nothing now  . Diverticulosis   . DVT  (deep venous thrombosis) ([place])   . Dysphagia    occasionally  . History of blood clots 2003   left leg prior to fem pop  . History of colon polyps   . Hyperlipidemia    takes Tricor and Lipitor daily  . Insomnia    takes Ambien nightly as needed  . Joint pain   . Joint swelling   . 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 OSA (obstructive sleep apnea)    CPAP  . Pacemaker   . Peripheral vascular disease ([place])   . Pleurisy    early 17's  . Urinary urgency     Past Surgical History:  Procedure Laterality Date  . CARDIAC CATHETERIZATION  2004  . CARDIAC CATHETERIZATION N/A 05/06/2015   Procedure: Left Heart Cath and Coronary Angiography;  Surgeon: Belva Crome, MD;  Location: [place] CV LAB;  Service: Cardiovascular;  Laterality: N/A;  . COLONOSCOPY    . hand and arm surgery Right    as a teenager  . HAND SURGERY Left   . INGUINAL HERNIA REPAIR Right   . JOINT REPLACEMENT Left December 09, 2013   Left Hip   . JOINT REPLACEMENT Right Aug. 25, 2015   Right Hip  . left knee surgery    . PACEMAKER INSERTION     due to bradycardia  . PERMANENT PACEMAKER GENERATOR CHANGE N/A  06/17/2012   Procedure: PERMANENT PACEMAKER GENERATOR CHANGE;  Surgeon: Deboraha Sprang, MD;  Location: [facility] CATH LAB;  Service: Cardiovascular;  Laterality: N/A;  . right and left fem-pop bypass    . stent (unspec)     x2  . TOTAL HIP ARTHROPLASTY Left 12/09/2013   Procedure: TOTAL HIP ARTHROPLASTY ANTERIOR APPROACH;  Surgeon: Hessie Dibble, MD;  Location: [place];  Service: Orthopedics;  Laterality: Left;  left anterior total hip arthroplasty  . TOTAL HIP ARTHROPLASTY Right 05/05/2014   Procedure: TOTAL HIP ARTHROPLASTY ANTERIOR APPROACH;  Surgeon: Hessie Dibble, MD;  Location: [facility];  Service: Orthopedics;  Laterality: Right;  . TURBINATE REDUCTION    . wisdom extracted       Current Outpatient Prescriptions  Medication Sig Dispense Refill  . atorvastatin (LIPITOR) 80 MG tablet  Take 1 tablet by mouth  daily 90 tablet 1  . azithromycin (ZITHROMAX Z-PAK) 250 MG tablet 2 by mouth on day 1, then 1 per day 6 tablet 1  . CALCIUM PO Take 600 mg by mouth 2 (two) times daily.    . carvedilol (COREG) 25 MG tablet Take 1 tablet (25 mg total) by mouth 2 (two) times daily. 180 tablet 3  . Cyanocobalamin (VITAMIN B 12 PO) Take 1 tablet by mouth daily.    Marland Kitchen dofetilide (TIKOSYN) 500 MCG capsule Take 1 capsule by mouth  twice daily 180 capsule 2  . ELIQUIS 5 MG TABS tablet Take 1 tablet by mouth two  times daily 180 tablet 2  . fenofibrate 160 MG tablet Take 1 tablet (160 mg total) by mouth daily. 90 tablet 3  . fexofenadine (ALLEGRA) 180 MG tablet Take 180 mg by mouth daily.    . fluocinonide-emollient (LIDEX-E) 0.05 % cream Apply 1 application topically 2 (two) times daily as needed. 30 g 1  . FOLIC ACID PO Take 1 tablet by mouth daily.    Marland Kitchen GLUCOSAMINE CHONDROITIN COMPLX PO Take 1 tablet by mouth 2 (two) times daily.     Marland Kitchen losartan (COZAAR) 25 MG tablet take 1 tablet by mouth once daily 90 tablet 1  . Lysine 500 MG TABS Take 500 mg by mouth 3 (three) times daily.    . Magnesium 250 MG TABS Take 250 mg by mouth 2 (two) times daily.     . Omega-3 Fatty Acids (FISH OIL) 1200 MG CAPS Take 1,200 mg by mouth daily.     . potassium chloride (K-DUR,KLOR-CON) 10 MEQ tablet Take 1 tablet by mouth two  times daily 180 tablet 1  . triamcinolone (NASACORT) 55 MCG/ACT AERO nasal inhaler Place 2 sprays into both nostrils daily.     Marland Kitchen zolpidem (AMBIEN) 10 MG tablet Take 0.5 tablets (5 mg total) by mouth at bedtime as needed for sleep. 90 tablet 1   No current facility-administered medications for this visit.     Allergies  Allergen Reactions  . Amiodarone     REACTION: peripheral neuropathy  . Niacin     REACTION: irregular heartbeat  . Ace Inhibitors     REACTION: cough  . Mometasone Furoate Other (See Comments)    (Nasonex) Causes nasal bleeding and nose bleeds    Review of Systems  negative except from HPI and PMH  Physical Exam BP (!) 86/50   Pulse (!) 53   Ht 6\' 3"  (1.905 m)   Wt 218 lb 12.8 oz (99.2 kg)   SpO2 97%   BMI 27.35 kg/m  Well developed and well nourished  in no acute distress HENT normal E scleral and icterus clear Neck Supple JVP flat; carotids brisk and full Clear to ausculation  *Regular rate and rhythm, no murmurs gallops or rub Soft with active bowel sounds No clubbing cyanosis non-pitting and none Edema Alert and oriented, grossly normal motor and sensory function Skin Warm and Dry  ECG demonstrates atrial fib at  142 Intervals 14/12/46 Incomplete Left bundle branch block   By the time his device was interrogated half an hour later, he had reverted to sinus rhythm.  Assessment and  Plan  Atrial fibrillation-paroxysmal  Symptomatic ventricular pacing  Pacemaker Biotronik The patient's device was interrogated and the information was fully reviewed.  The device was reprogrammed as above  LBBB -question rate related  Nonischemic cardiac myopathy  Coronary artery disease A) prior stenting B) diffuse nonobstructive disease  Prolonged episode of atrial fibrillation today 2 hours; however, he has had no interval atrial fibrillation since 2/17. He has had this interesting hypothesis over the last 20 years of exposure to niacin has been associated with atrial fibrillation.  There is been interval  albeit improvement in LV function. We will continue up titration of his carvedilol. He brings in the original paper by Karn Cassis reminding me that 75 kg was the cutoff for those people for whom a 50 mg twice daily was the target.   We will plan to reassess LV function in about 6 months.  We spent more than 50% of our >25 min visit in face to face counseling regarding the above

## 2016-06-21 ENCOUNTER — Encounter: Payer: Self-pay | Admitting: Internal Medicine

## 2016-06-22 LAB — CUP PACEART INCLINIC DEVICE CHECK
Battery Remaining Percentage: 75 %
Date Time Interrogation Session: 20171012152109
Implantable Lead Implant Date: 19980115
Implantable Lead Location: 753859
Lead Channel Impedance Value: 331 Ohm
Lead Channel Impedance Value: 351 Ohm
Lead Channel Pacing Threshold Pulse Width: 0.4 ms
Lead Channel Pacing Threshold Pulse Width: 0.4 ms
Lead Channel Sensing Intrinsic Amplitude: 5.6 mV
Lead Channel Setting Pacing Amplitude: 1.6 V
MDC IDC LEAD IMPLANT DT: 19980115
MDC IDC LEAD LOCATION: 753860
MDC IDC MSMT BATTERY REMAINING LONGEVITY: 107 mo
MDC IDC MSMT LEADCHNL RA PACING THRESHOLD AMPLITUDE: 0.6 V
MDC IDC MSMT LEADCHNL RA SENSING INTR AMPL: 1.7 mV
MDC IDC MSMT LEADCHNL RV PACING THRESHOLD AMPLITUDE: 0.8 V
MDC IDC SET LEADCHNL RV PACING AMPLITUDE: 2 V
MDC IDC SET LEADCHNL RV PACING PULSEWIDTH: 0.4 ms
MDC IDC STAT BRADY RA PERCENT PACED: 68 %
MDC IDC STAT BRADY RV PERCENT PACED: 3 %
Pulse Gen Serial Number: 66339555

## 2016-07-03 ENCOUNTER — Encounter: Payer: Self-pay | Admitting: Family

## 2016-07-04 ENCOUNTER — Other Ambulatory Visit: Payer: Self-pay | Admitting: *Deleted

## 2016-07-04 DIAGNOSIS — Z48812 Encounter for surgical aftercare following surgery on the circulatory system: Secondary | ICD-10-CM

## 2016-07-04 DIAGNOSIS — I739 Peripheral vascular disease, unspecified: Secondary | ICD-10-CM

## 2016-07-05 ENCOUNTER — Other Ambulatory Visit: Payer: Self-pay | Admitting: Internal Medicine

## 2016-07-05 ENCOUNTER — Ambulatory Visit (INDEPENDENT_AMBULATORY_CARE_PROVIDER_SITE_OTHER)
Admission: RE | Admit: 2016-07-05 | Discharge: 2016-07-05 | Disposition: A | Discharge status: Home / Self Care | Payer: Medicare Other | Source: Ambulatory Visit | Attending: Vascular Surgery | Admitting: Vascular Surgery

## 2016-07-05 ENCOUNTER — Encounter: Payer: Self-pay | Admitting: Family

## 2016-07-05 ENCOUNTER — Ambulatory Visit (HOSPITAL_COMMUNITY)
Admission: RE | Admit: 2016-07-05 | Discharge: 2016-07-05 | Disposition: A | Discharge status: Home / Self Care | Payer: Medicare Other | Source: Ambulatory Visit | Attending: Vascular Surgery | Admitting: Vascular Surgery

## 2016-07-05 ENCOUNTER — Ambulatory Visit (INDEPENDENT_AMBULATORY_CARE_PROVIDER_SITE_OTHER): Payer: Medicare Other | Admitting: Family

## 2016-07-05 VITALS — BP 116/66 | HR 67 | Temp 97.0°F | Resp 18 | Ht 75.0 in | Wt 210.0 lb

## 2016-07-05 DIAGNOSIS — I724 Aneurysm of artery of lower extremity: Secondary | ICD-10-CM | POA: Diagnosis not present

## 2016-07-05 DIAGNOSIS — I739 Peripheral vascular disease, unspecified: Secondary | ICD-10-CM

## 2016-07-05 DIAGNOSIS — Z95828 Presence of other vascular implants and grafts: Secondary | ICD-10-CM

## 2016-07-05 DIAGNOSIS — Z48812 Encounter for surgical aftercare following surgery on the circulatory system: Secondary | ICD-10-CM

## 2016-07-05 NOTE — Patient Instructions (Signed)
Peripheral Vascular Disease Peripheral vascular disease (PVD) is a disease of the blood vessels that are not part of your heart and brain. A simple term for PVD is poor circulation. In most cases, PVD narrows the blood vessels that carry blood from your heart to the rest of your body. This can result in a decreased supply of blood to your arms, legs, and internal organs, like your stomach or kidneys. However, it most often affects a person's lower legs and feet. There are two types of PVD.  Organic PVD. This is the more common type. It is caused by damage to the structure of blood vessels.  Functional PVD. This is caused by conditions that make blood vessels contract and tighten (spasm). Without treatment, PVD tends to get worse over time. PVD can also lead to acute ischemic limb. This is when an arm or limb suddenly has trouble getting enough blood. This is a medical emergency. CAUSES Each type of PVD has many different causes. The most common cause of PVD is buildup of a fatty material (plaque) inside of your arteries (atherosclerosis). Small amounts of plaque can break off from the walls of the blood vessels and become lodged in a smaller artery. This blocks blood flow and can cause acute ischemic limb. Other common causes of PVD include:  Blood clots that form inside of blood vessels.  Injuries to blood vessels.  Diseases that cause inflammation of blood vessels or cause blood vessel spasms.  Health behaviors and health history that increase your risk of developing PVD. RISK FACTORS  You may have a greater risk of PVD if you:  Have a family history of PVD.  Have certain medical conditions, including:  High cholesterol.  Diabetes.  High blood pressure (hypertension).  Coronary heart disease.  Past problems with blood clots.  Past injury, such as burns or a broken bone. These may have damaged blood vessels in your limbs.  Buerger disease. This is caused by inflamed blood  vessels in your hands and feet.  Some forms of arthritis.  Rare birth defects that affect the arteries in your legs.  Use tobacco.  Do not get enough exercise.  Are obese.  Are age 50 or older. SIGNS AND SYMPTOMS  PVD may cause many different symptoms. Your symptoms depend on what part of your body is not getting enough blood. Some common signs and symptoms include:  Cramps in your lower legs. This may be a symptom of poor leg circulation (claudication).  Pain and weakness in your legs while you are physically active that goes away when you rest (intermittent claudication).  Leg pain when at rest.  Leg numbness, tingling, or weakness.  Coldness in a leg or foot, especially when compared with the other leg.  Skin or hair changes. These can include:  Hair loss.  Shiny skin.  Pale or bluish skin.  Thick toenails.  Inability to get or maintain an erection (erectile dysfunction). People with PVD are more prone to developing ulcers and sores on their toes, feet, or legs. These may take longer than normal to heal. DIAGNOSIS Your health care provider may diagnose PVD from your signs and symptoms. The health care provider will also do a physical exam. You may have tests to find out what is causing your PVD and determine its severity. Tests may include:  Blood pressure recordings from your arms and legs and measurements of the strength of your pulses (pulse volume recordings).  Imaging studies using sound waves to take pictures of   the blood flow through your blood vessels (Doppler ultrasound).  Injecting a dye into your blood vessels before having imaging studies using:  X-rays (angiogram or arteriogram).  Computer-generated X-rays (CT angiogram).  A powerful electromagnetic field and a computer (magnetic resonance angiogram or MRA). TREATMENT Treatment for PVD depends on the cause of your condition and the severity of your symptoms. It also depends on your age. Underlying  causes need to be treated and controlled. These include long-lasting (chronic) conditions, such as diabetes, high cholesterol, and high blood pressure. You may need to first try making lifestyle changes and taking medicines. Surgery may be needed if these do not work. Lifestyle changes may include:  Quitting smoking.  Exercising regularly.  Following a low-fat, low-cholesterol diet. Medicines may include:  Blood thinners to prevent blood clots.  Medicines to improve blood flow.  Medicines to improve your blood cholesterol levels. Surgical procedures may include:  A procedure that uses an inflated balloon to open a blocked artery and improve blood flow (angioplasty).  A procedure to put in a tube (stent) to keep a blocked artery open (stent implant).  Surgery to reroute blood flow around a blocked artery (peripheral bypass surgery).  Surgery to remove dead tissue from an infected wound on the affected limb.  Amputation. This is surgical removal of the affected limb. This may be necessary in cases of acute ischemic limb that are not improved through medical or surgical treatments. HOME CARE INSTRUCTIONS  Take medicines only as directed by your health care provider.  Do not use any tobacco products, including cigarettes, chewing tobacco, or electronic cigarettes. If you need help quitting, ask your health care provider.  Lose weight if you are overweight, and maintain a healthy weight as directed by your health care provider.  Eat a diet that is low in fat and cholesterol. If you need help, ask your health care provider.  Exercise regularly. Ask your health care provider to suggest some good activities for you.  Use compression stockings or other mechanical devices as directed by your health care provider.  Take good care of your feet.  Wear comfortable shoes that fit well.  Check your feet often for any cuts or sores. SEEK MEDICAL CARE IF:  You have cramps in your legs  while walking.  You have leg pain when you are at rest.  You have coldness in a leg or foot.  Your skin changes.  You have erectile dysfunction.  You have cuts or sores on your feet that are not healing. SEEK IMMEDIATE MEDICAL CARE IF:  Your arm or leg turns cold and blue.  Your arms or legs become red, warm, swollen, painful, or numb.  You have chest pain or trouble breathing.  You suddenly have weakness in your face, arm, or leg.  You become very confused or lose the ability to speak.  You suddenly have a very bad headache or lose your vision.   This information is not intended to replace advice given to you by your health care provider. Make sure you discuss any questions you have with your health care provider.   Document Released: 10/05/2004 Document Revised: 09/18/2014 Document Reviewed: 02/05/2014 Elsevier Interactive Patient Education 2016 [employer]  

## 2016-07-05 NOTE — Progress Notes (Signed)
[facility] [place]   CC: Follow up peripheral artery occlusive disease  History of Present Illness [name] is a 75 y.o. male patient of Dr. Amedeo Plenty, Dr. Kellie Simmering, then Dr. 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 He returns today for routine follow up. He denies claudication symptoms in his legs with walking, denies non healing wounds. He saw a dermatologist for his light lower leg rash, who treated him with a topical corticosteroid which resolved the rash.  He had both hips replaced at [facility] in 2015.  He has a pacemaker, history of atrial fib for which he also takes Eliquis. His father had a dissecting AAA, he had a sleeve surgical repair, he was a heavy smoker, did not have DM, was obese, per pt. Pt had a normal AAA Duplex in 2013. Pt denies back or abdominal pain. Pt denies any history of stroke or TIA.  He states that he may have a picnched nerve in his c-spine. He states his EF is 30%.  Pt Diabetic: No Pt smoker: non-smoker  Pt meds include: Statin :Yes ASA: no Other anticoagulants/antiplatelets: Eliquis    Past Medical History:  Diagnosis Date  . Allergic rhinitis    takes Allegra daily  . Allergic rhinitis, cause unspecified 06/22/2012  . Arthritis   . Atrial fibrillation ([place])    takes Tikosyn and Eliquis daily  . 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)   . Coronary artery disease    LAD stenting 2004 non-DES  . Depression    took Zoloft 62yrs ago but nothing now  . Diverticulosis   . DVT (deep venous thrombosis) ([place])   . Dysphagia    occasionally  . History of blood clots 2003   left leg prior to fem pop  . History of colon polyps   . Hyperlipidemia    takes Tricor and Lipitor daily  . Insomnia    takes Ambien nightly as needed  . Joint pain   . Joint swelling   . 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 OSA (obstructive sleep apnea)    CPAP  . Pacemaker   . Peripheral vascular disease ([place])   . Pleurisy    early 51's  . Urinary urgency     Social History Social History  Substance Use Topics  . Smoking status: Never Smoker  . Smokeless tobacco: Never Used  . Alcohol use No     Comment: nothing since 2000    Family History Family History  Problem Relation Age of Onset  . Diabetes Other     family hx  . Sleep apnea Other     family hx  . Diabetes Mother   . Heart disease Mother     Before age 25  . Heart disease Father     Before age 42  . Heart attack Father   . Stroke Father   . Aneurysm Father     bilat pop art aneurysms  . Peripheral vascular disease Father     Popliteal Aneurysm    Past Surgical History:  Procedure Laterality Date  . CARDIAC CATHETERIZATION  2004  . CARDIAC CATHETERIZATION N/A 05/06/2015   Procedure: Left Heart Cath and Coronary Angiography;  Surgeon: Belva Crome, MD;  Location: [facility];  Service: Cardiovascular;  Laterality: N/A;  . COLONOSCOPY    . hand and arm surgery  Right    as a teenager  . HAND SURGERY Left   . INGUINAL HERNIA REPAIR Right   . JOINT REPLACEMENT Left December 09, 2013   Left Hip   . JOINT REPLACEMENT Right Aug. 25, 2015   Right Hip  . left knee surgery    . PACEMAKER INSERTION     due to bradycardia  . PERMANENT PACEMAKER GENERATOR CHANGE N/A 06/17/2012   Procedure: PERMANENT PACEMAKER GENERATOR CHANGE;  Surgeon: Deboraha Sprang, MD;  Location: [facility] CATH LAB;  Service: Cardiovascular;  Laterality: N/A;  . right and left fem-pop bypass    . stent (unspec)     x2  . TOTAL HIP ARTHROPLASTY Left 12/09/2013   Procedure: TOTAL HIP ARTHROPLASTY ANTERIOR APPROACH;  Surgeon: Hessie Dibble, MD;  Location: [place];  Service: Orthopedics;  Laterality: Left;  left anterior total hip arthroplasty  . TOTAL HIP ARTHROPLASTY Right 05/05/2014   Procedure: TOTAL HIP ARTHROPLASTY ANTERIOR APPROACH;   Surgeon: Hessie Dibble, MD;  Location: [facility];  Service: Orthopedics;  Laterality: Right;  . TURBINATE REDUCTION    . wisdom extracted       Allergies  Allergen Reactions  . Amiodarone     REACTION: peripheral neuropathy  . Niacin     REACTION: irregular heartbeat  . Ace Inhibitors     REACTION: cough  . Mometasone Furoate Other (See Comments)    (Nasonex) Causes nasal bleeding and nose bleeds    Current Outpatient Prescriptions  Medication Sig Dispense Refill  . atorvastatin (LIPITOR) 80 MG tablet Take 1 tablet by mouth  daily 90 tablet 1  . CALCIUM PO Take 600 mg by mouth 2 (two) times daily.    . carvedilol (COREG) 25 MG tablet Take two tablets (50 mg) by mouth twice daily 360 tablet 3  . Cyanocobalamin (VITAMIN B 12 PO) Take 1 tablet by mouth daily.    Marland Kitchen dofetilide (TIKOSYN) 500 MCG capsule Take 1 capsule by mouth  twice daily 180 capsule 2  . ELIQUIS 5 MG TABS tablet Take 1 tablet by mouth two  times daily 180 tablet 2  . fenofibrate 160 MG tablet Take 1 tablet (160 mg total) by mouth daily. 90 tablet 3  . fexofenadine (ALLEGRA) 180 MG tablet Take 180 mg by mouth daily.    . Fluocinonide Emulsified Base 0.05 % CREA APPLY TO AFFECTED AREA(S)  TWO TIMES DAILY AS NEEDED 60 g 0  . FOLIC ACID PO Take 1 tablet by mouth daily.    Marland Kitchen GLUCOSAMINE CHONDROITIN COMPLX PO Take 1 tablet by mouth 2 (two) times daily.     Marland Kitchen losartan (COZAAR) 25 MG tablet take 1 tablet by mouth once daily 90 tablet 1  . Lysine 500 MG TABS Take 500 mg by mouth 3 (three) times daily.    . Magnesium 250 MG TABS Take 250 mg by mouth 2 (two) times daily.     . Omega-3 Fatty Acids (FISH OIL) 1200 MG CAPS Take 1,200 mg by mouth daily.     . potassium chloride (K-DUR,KLOR-CON) 10 MEQ tablet Take 1 tablet by mouth two  times daily 180 tablet 1  . triamcinolone (NASACORT) 55 MCG/ACT AERO nasal inhaler Place 2 sprays into both nostrils daily.     Marland Kitchen zolpidem (AMBIEN) 10 MG tablet Take 0.5 tablets (5 mg total) by mouth  at bedtime as needed for sleep. 90 tablet 1  . azithromycin (ZITHROMAX Z-PAK) 250 MG tablet 2 by mouth on day 1, then 1 per  day (Patient not taking: Reported on 07/05/2016) 6 tablet 1   No current facility-administered medications for this visit.     ROS: See HPI for pertinent positives and negatives.   Physical Examination  Vitals:   07/05/16 1510  BP: 116/66  Pulse: 67  Resp: 18  Temp: 97 F (36.1 C)  SpO2: 96%  Weight: 210 lb (95.3 kg)  Height: 6\' 3"  (1.905 m)   Body mass index is 26.25 kg/m.  General: A&O x 3, WDWN. Gait: normal Eyes: PERRLA. Pulmonary: Respirations are non labored, CTAB, without wheezes , rales or rhonchi. Cardiac: regular rythm, no detected murmur, pacemaker palpated left upper chest subcutaneously.     Carotid Bruits Right Left   Negative Negative  Aorta is palpable. Radial pulses: are 2+ palpable and =   VASCULAR EXAM: Extremities without ischemic changes  without Gangrene; without open wounds. Multiple small varicosities at ankles.     LE Pulses Right Left   FEMORAL  palpable  palpable    POPLITEAL not palpable  not palpable   POSTERIOR TIBIAL 2+ palpable  2+ palpable    DORSALIS PEDIS  ANTERIOR TIBIAL 1+ palpable  faintly palpable    Abdomen: soft, NT, no palpable masses. Skin: see extremities, no ulcers. Musculoskeletal: no muscle wasting or atrophy. Neurologic: A&O X 3; Appropriate Affect; MOTOR FUNCTION: moving all extremities equally, motor strength 5/5 throughout. Speech is fluent/normal. CN 2-12 intact.    ASSESSMENT: EASTIN WHITELOW is a 75 y.o. male who is s/p right femoropopliteal arterial bypass graft 08/17/2002 and left femoropopliteal arterial bypass graft 02/19/2002. He has no  claudication symptoms with walking, no non healing wounds. His left saphenous vein was harvested in 2003 for the leg bypass graft.  DATA Today's bilateral LE arterial Duplex suggests widely patent bilateral lower extremity bypass graft without evidence of restenosis or hyperplasia.  No significant change compared to prior exam on 04/30/15. ABI's and TBI's remain normal bilaterally with mostly triphasic waveforms, right DP is biphasic.    PLAN:  Continue excellent walking and exercise habits. Based on the patient's vascular studies and examination, pt will return to clinic in 1 year with ABI's and bilateral LE arterial duplex.  I discussed in depth with the patient the nature of atherosclerosis, and emphasized the importance of maximal medical management including strict control of blood pressure, blood glucose, and lipid levels, obtaining regular exercise, and continued cessation of smoking.  The patient is aware that without maximal medical management the underlying atherosclerotic disease process will progress, limiting the benefit of any interventions.  The patient was given information about PAD including signs, symptoms, treatment, what symptoms should prompt the patient to seek immediate medical care, and risk reduction measures to take.  Clemon Chambers, RN, MSN, FNP-C [facility] Phone: [phone]  Clinic MD: Oneida Alar  07/05/16 3:27 PM

## 2016-07-11 ENCOUNTER — Ambulatory Visit: Payer: Self-pay | Admitting: Allergy and Immunology

## 2016-07-13 ENCOUNTER — Other Ambulatory Visit: Payer: Self-pay | Admitting: Internal Medicine

## 2016-07-18 ENCOUNTER — Ambulatory Visit (INDEPENDENT_AMBULATORY_CARE_PROVIDER_SITE_OTHER): Payer: Medicare Other | Admitting: Allergy and Immunology

## 2016-07-18 ENCOUNTER — Encounter: Payer: Self-pay | Admitting: Allergy and Immunology

## 2016-07-18 VITALS — BP 114/72 | HR 68 | Temp 98.7°F | Resp 16 | Ht 75.0 in | Wt 213.0 lb

## 2016-07-18 DIAGNOSIS — J3489 Other specified disorders of nose and nasal sinuses: Secondary | ICD-10-CM

## 2016-07-18 DIAGNOSIS — J3089 Other allergic rhinitis: Secondary | ICD-10-CM | POA: Diagnosis not present

## 2016-07-18 DIAGNOSIS — K219 Gastro-esophageal reflux disease without esophagitis: Secondary | ICD-10-CM

## 2016-07-18 MED ORDER — RANITIDINE HCL 300 MG PO TABS: 300.0000 mg | ORAL_TABLET | Freq: Every day | ORAL | 5 refills | Status: DC

## 2016-07-18 MED ORDER — BECLOMETHASONE DIPROPIONATE 80 MCG/ACT NA AERS: 2.0000 | INHALATION_SPRAY | Freq: Every day | NASAL | 5 refills | Status: DC

## 2016-07-18 MED ORDER — MONTELUKAST SODIUM 10 MG PO TABS: 10.0000 mg | ORAL_TABLET | Freq: Every day | ORAL | 5 refills | Status: DC

## 2016-07-18 NOTE — Patient Instructions (Addendum)
  1. Allergen avoidance measures  2. Treat and prevent inflammation:   A. montelukast 10 mg tablet 1 time per day  B. Qnasl 80 - 2 puffs each nostril one time per day  3. Treat and prevent reflux:   A. dramatically consolidate caffeine use through the day  B. start ranitidine 300 mg tablet 1 time per day  4. If needed:   A. nasal saline spray  B. OTC antihistamine - Zyrtec/Allegra/Claritin  5. Return to clinic in 4 weeks or earlier if problem

## 2016-07-18 NOTE — Progress Notes (Signed)
Dear Dr. Jenny Reichmann,  Thank you for referring [name] to the [facility] on 07/18/2016.   Below is a summation of this patient's evaluation and recommendations.  Thank you for your referral. I will keep you informed about this patient's response to treatment.   If you have any questions please do not hesitate to contact me.   Sincerely,  Jiles Prows, MD [place]   ______________________________________________________________________    NEW PATIENT NOTE  Referring Provider: Biagio Borg, MD Primary Provider: Cathlean Cower, MD Date of office visit: 07/18/2016    Subjective:   Chief Complaint:  [name] (DOB: 12-02-1940) is a 75 y.o. male who presents to the clinic on 07/18/2016 with a chief complaint of Allergies and Nasal Congestion .     HPI: Mylen presents to this clinic in evaluation of persistent respiratory tract symptoms that have been present for several years occurring on a perennial basis with springtime exacerbation.  His symptoms include nasal congestion and sneezing and clear rhinorrhea. Occasionally he'll get bloody nasal discharge if his septal perforation is acting up. He has constant postnasal drip and throat clearing and feeling a "presence" stuck above his palate. He does not have any associated anosmia or decreased ability to taste or significant headaches. He still gets all these symptoms even though he uses a nasal steroid on a regular basis as well as an antihistamine.  There are no obvious provoking factors giving rise to his symptoms. He does not have a history of significant reflux other than to take Tums less than 1 time per week for a rare regurgitation event. He does drink 5 coffees a day and occasionally a diet soda and does not consume any alcohol or smoke tobacco.  Evaluation to date has included examination by Dr. Lucia Gaskins including rhinoscopy and  apparently a normal sinus CT scan.  Past Medical History:  Diagnosis Date  . Allergic rhinitis    takes Allegra daily  . Allergic rhinitis, cause unspecified 06/22/2012  . Arthritis   . Atrial fibrillation ([place])    takes Tikosyn and Eliquis daily  . 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 Coronary artery disease    LAD stenting 2004 non-DES  . Depression    took Zoloft 3yrs ago but nothing now  . Diverticulosis   . DVT (deep venous thrombosis) ([place])   . Dysphagia    occasionally  . History of blood clots 2003   left leg prior to fem pop  . History of colon polyps   . Hyperlipidemia    takes Tricor and Lipitor daily  . Insomnia    takes Ambien nightly as needed  . Joint pain   . Joint swelling   . 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 OSA (obstructive sleep apnea)    CPAP  . Pacemaker   . Peripheral vascular disease ([place])   . Pleurisy    early 6's  . Urinary urgency     Past Surgical History:  Procedure Laterality Date  . CARDIAC CATHETERIZATION  2004  . CARDIAC CATHETERIZATION N/A 05/06/2015   Procedure: Left Heart Cath and Coronary Angiography;  Surgeon: Belva Crome, MD;  Location: [place] [facility];  Service: Cardiovascular;  Laterality: N/A;  . COLONOSCOPY    . hand and arm surgery Right    as a teenager  . HAND  SURGERY Left   . INGUINAL HERNIA REPAIR Right   . JOINT REPLACEMENT Left December 09, 2013   Left Hip   . JOINT REPLACEMENT Right Aug. 25, 2015   Right Hip  . left knee surgery    . PACEMAKER INSERTION     due to bradycardia  . PERMANENT PACEMAKER GENERATOR CHANGE N/A 06/17/2012   Procedure: PERMANENT PACEMAKER GENERATOR CHANGE;  Surgeon: Deboraha Sprang, MD;  Location[facility] C[facility] LAB;  Service: Cardiovascular;  Laterality: N/A;  . right and left fem-pop bypass    . stent (unspec)     x2  . TOTAL HIP ARTHROPLASTY Left 12/09/2013   Procedure: TOTAL HIP ARTHROPLASTY ANTERIOR APPROACH;  Surgeon: Hessie Dibble, MD;   Location: [place];  Service: Orthopedics;  Laterality: Left;  left anterior total hip arthroplasty  . TOTAL HIP ARTHROPLASTY Right 05/05/2014   Procedure: TOTAL HIP ARTHROPLASTY ANTERIOR APPROACH;  Surgeon: Hessie Dibble, MD;  Location: [place];  Service: Orthopedics;  Laterality: Right;  . TURBINATE REDUCTION    . wisdom extracted         Medication List      atorvastatin 80 MG tablet Commonly known as:  LIPITOR Take 1 tablet by mouth  daily   CALCIUM PO Take 600 mg by mouth 2 (two) times daily.   carvedilol 25 MG tablet Commonly known as:  COREG Take two tablets (50 mg) by mouth twice daily   dofetilide 500 MCG capsule Commonly known as:  TIKOSYN Take 1 capsule by mouth  twice daily   ELIQUIS 5 MG Tabs tablet Generic drug:  apixaban Take 1 tablet by mouth two  times daily   fenofibrate 160 MG tablet Take 1 tablet (160 mg total) by mouth daily.   fexofenadine 180 MG tablet Commonly known as:  ALLEGRA Take 180 mg by mouth daily.   Fish Oil 1200 MG Caps Take 1,200 mg by mouth daily.   Fluocinonide Emulsified Base 0.05 % Crea APPLY TO AFFECTED AREA(S)  TWO TIMES DAILY AS NEEDED   FOLIC ACID PO Take 1 tablet by mouth daily.   GLUCOSAMINE CHONDROITIN COMPLX PO Take 1 tablet by mouth 2 (two) times daily.   losartan 25 MG tablet Commonly known as:  COZAAR take 1 tablet by mouth once daily   Lysine 500 MG Tabs Take 500 mg by mouth 3 (three) times daily.   Magnesium 250 MG Tabs Take 250 mg by mouth 2 (two) times daily.   potassium chloride 10 MEQ tablet Commonly known as:  K-DUR,KLOR-CON Take 1 tablet (10 mEq total) by mouth 2 (two) times daily.   triamcinolone 55 MCG/ACT Aero nasal inhaler Commonly known as:  NASACORT Place 2 sprays into both nostrils daily.   VITAMIN B 12 PO Take 1 tablet by mouth daily.   zolpidem 10 MG tablet Commonly known as:  AMBIEN Take 0.5 tablets (5 mg total) by mouth at bedtime as needed for sleep.       Allergies    Allergen Reactions  . Amiodarone     REACTION: peripheral neuropathy  . Niacin     REACTION: irregular heartbeat  . Ace Inhibitors     REACTION: cough  . Mometasone Furoate Other (See Comments)    (Nasonex) Causes nasal bleeding and nose bleeds    Review of systems negative except as noted in HPI / PMHx or noted below:  Review of Systems  Constitutional: Negative.   HENT: Negative.   Eyes: Negative.   Respiratory: Negative.  Cardiovascular: Negative.   Gastrointestinal: Negative.   Genitourinary: Negative.   Musculoskeletal: Negative.   Skin: Negative.   Neurological: Negative.   Endo/Heme/Allergies: Negative.   Psychiatric/Behavioral: Negative.     Family History  Problem Relation Age of Onset  . Diabetes Other     family hx  . Sleep apnea Other     family hx  . Diabetes Mother   . Heart disease Mother     Before age 41  . Heart disease Father     Before age 60  . Heart attack Father   . Stroke Father   . Aneurysm Father     bilat pop art aneurysms  . Peripheral vascular disease Father     Popliteal Aneurysm    Social History   Social History  . Marital status: Married    Spouse name: N/A  . Number of children: N/A  . Years of education: N/A   Occupational History  . Not on file.   Social History Main Topics  . Smoking status: Never Smoker  . Smokeless tobacco: Never Used  . Alcohol use No     Comment: nothing since 2000  . Drug use: No  . Sexual activity: Yes   Other Topics Concern  . 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     Environmental and Social history  Lives in a house with a dry environment, no animals located inside the household, carpeting in the bedroom, no plastic on the bed or pillow, and no smoking ongoing with inside the household.  Objective:   Vitals:   07/18/16 1359  BP: 114/72  Pulse: 68  Resp: 16  Temp: 98.7 F (37.1 C)   Height: 6\' 3"  (190.5 cm) Weight:  213 lb (96.6 kg)  Physical Exam  Constitutional: He is well-developed, well-nourished, and in no distress.  Constant throat clearing  HENT:  Head: Normocephalic. Head is without right periorbital erythema and without left periorbital erythema.  Right Ear: Tympanic membrane, external ear and ear canal normal.  Left Ear: Tympanic membrane, external ear and ear canal normal.  Nose: Nose normal. No rhinorrhea. Mucosal edema: septal perforation.  Mouth/Throat: Oropharynx is clear and moist and mucous membranes are normal. No oropharyngeal exudate.  Eyes: Conjunctivae and lids are normal. Pupils are equal, round, and reactive to light.  Neck: Trachea normal. No tracheal deviation present. No thyromegaly present.  Cardiovascular: Normal rate, regular rhythm, S1 normal, S2 normal and normal heart sounds.   No murmur heard. Pulmonary/Chest: Effort normal. No stridor. No tachypnea. No respiratory distress. He has no wheezes. He has no rales. He exhibits no tenderness.  Abdominal: Soft. He exhibits no distension and no mass. There is no hepatosplenomegaly. There is no tenderness. There is no rebound and no guarding.  Musculoskeletal: He exhibits no edema or tenderness.  Lymphadenopathy:       Head (right side): No tonsillar adenopathy present.       Head (left side): No tonsillar adenopathy present.    He has no cervical adenopathy.    He has no axillary adenopathy.  Neurological: He is alert. Gait normal.  Skin: No rash noted. He is not diaphoretic. No erythema. No pallor. Nails show no clubbing.  Psychiatric: Mood and affect normal.    Diagnostics: Allergy skin tests were performed. He did not demonstrate any hypersensitivity against a screening panel of aeroallergens or foods.  Results of a CT scan of his sinuses obtained  06/24/2014 identified the following:  The maxillary sinuses are clear. The frontal sinuses are clear. The sphenoid sinus is clear. There is minor BILATERAL anterior  ethmoid mucosal thickening.  The nasal septum is midline. There is no concha bullosa. Mild BILATERAL ostiomeatal unit narrowing due to [facility] cells.  Negative visualized intracranial compartment. No orbital findings. No temporal bone abnormality. Unremarkable nasopharynx. Similar appearance to priors.  Assessment and Plan:    1. Other allergic rhinitis   2. LPRD (laryngopharyngeal reflux disease)   3. Nasal septal perforation     1. Allergen avoidance measures  2. Treat and prevent inflammation:   A. montelukast 10 mg tablet 1 time per day  B. Qnasl 80 - 2 puffs each nostril one time per day  3. Treat and prevent reflux:   A. dramatically consolidate caffeine use through the day  B. start ranitidine 300 mg tablet 1 time per day  4. If needed:   A. nasal saline spray  B. OTC antihistamine - Zyrtec/Allegra/Claritin  5. Return to clinic in 4 weeks or earlier if problem  Gaylon appears to have significant inflammation of his upper respiratory track with significant mucus production and I suspect that one of the major triggers giving rise to this issue is reflux as his history is quite consistent with LPR. I'm going to have him consolidate his rather dramatic caffeine consumption throughout the day and start ranitidine at the same time he utilizes anti-inflammatory agents for his upper respiratory tract as described above. I will regroup with him in 4 weeks or earlier if there is a problem.  Jiles Prows, MD [facility]

## 2016-08-01 NOTE — Addendum Note (Signed)
Addended by: Lianne Cure A on: 08/01/2016 02:08 PM   Modules accepted: Orders

## 2016-08-07 ENCOUNTER — Telehealth: Payer: Self-pay | Admitting: Allergy and Immunology

## 2016-08-07 NOTE — Telephone Encounter (Signed)
Spoke to patient advised Qnasl was sent to pharmacy at appt. Patient verbalized understanding and will contact pharmacy to get it filled writer advised to check if a prior Josem Kaufmann is needed to fax [place].

## 2016-08-07 NOTE — Telephone Encounter (Signed)
Patient called back states he needs a prior auth on the medication. Advised we would give him a sample up front. Writer went to get sample but we our out Qnasl. Called and left message for patient

## 2016-08-07 NOTE — Telephone Encounter (Signed)
He says that he was given some prescriptions and  a sample of QNasal at his last appointment, but wasn't given a prescription for it. He will be out before his return visit. Can a script be called in for it please?  He uses [facility] on [address]

## 2016-08-16 ENCOUNTER — Encounter: Payer: Medicare Other | Admitting: Internal Medicine

## 2016-08-17 ENCOUNTER — Telehealth: Payer: Self-pay | Admitting: Allergy and Immunology

## 2016-08-17 NOTE — Telephone Encounter (Signed)
Pt called and said that [facility] on [address] sent a auth form for [place] to get approval for his Qnasl and has not heard anything. Please call [phone]

## 2016-08-18 NOTE — Telephone Encounter (Signed)
Found [facility] fax. Filled it out and faxed back to pharmacy.

## 2016-08-22 ENCOUNTER — Telehealth: Payer: Self-pay | Admitting: *Deleted

## 2016-08-22 NOTE — Telephone Encounter (Signed)
Please inform patient that he can try OTC Rhinocort one spray shot also once a day and we will discuss further with RV.

## 2016-08-22 NOTE — Telephone Encounter (Signed)
Patient advised of instructions. 

## 2016-08-22 NOTE — Telephone Encounter (Signed)
Patient insurance denied Qnasl. Insurance prefers Fluticasone. Can we switch?

## 2016-09-06 ENCOUNTER — Ambulatory Visit (INDEPENDENT_AMBULATORY_CARE_PROVIDER_SITE_OTHER): Payer: Medicare Other | Admitting: Allergy and Immunology

## 2016-09-06 ENCOUNTER — Encounter: Payer: Self-pay | Admitting: Allergy and Immunology

## 2016-09-06 VITALS — BP 112/70 | HR 67 | Temp 97.9°F | Resp 20

## 2016-09-06 DIAGNOSIS — J3089 Other allergic rhinitis: Secondary | ICD-10-CM | POA: Diagnosis not present

## 2016-09-06 DIAGNOSIS — K219 Gastro-esophageal reflux disease without esophagitis: Secondary | ICD-10-CM

## 2016-09-06 DIAGNOSIS — J3489 Other specified disorders of nose and nasal sinuses: Secondary | ICD-10-CM

## 2016-09-06 NOTE — Patient Instructions (Signed)
  1. Treat and prevent inflammation:   A. Discontinue montelukast  B. Qnasl 80 - 2 puffs each nostril one time per day  2. Treat and prevent reflux:   A. consolidate caffeine use through the day  B. ranitidine 300 mg tablet 1 time per day  3. If needed:   A. nasal saline spray  B. OTC antihistamine - Zyrtec/Allegra/Claritin  4. Return to clinic in 8-10 weeks or earlier if problem

## 2016-09-06 NOTE — Progress Notes (Signed)
Follow-up Note  Referring Provider: Biagio Borg, MD Primary Provider: Cathlean Cower, MD Date of Office Visit: 09/06/2016  Subjective:   [name] (DOB: 1941/01/30) is a 75 y.o. male who returns to the [facility] on 09/06/2016 in re-evaluation of the following:  HPI: Sorren returns to this clinic in evaluation of his rhinitis and LPR. I last saw him in his clinic during his initial evaluation of 07/18/2016 at which time we placed him on therapy for upper respiratory tract inflammation and therapy directed against reflux.  He is much better regarding his nasal issue and his constant postnasal drip and the sensation that there is a presence above his palate and his throat clearing and slight cough. He is probably about 30% better over the course of the past 4 weeks.  He has decreased his caffeine consumption. He's now down from 6 cups of coffee per day to 2 cups of coffee per day and he's now drinking decaffeinated sodas.  He has had some problems with diarrhea while using his montelukast and ranitidine. He is not sure which of the two is giving rise to this problem.  Allergies as of 09/06/2016      Reactions   Amiodarone    REACTION: peripheral neuropathy   Niacin    REACTION: irregular heartbeat   Ace Inhibitors    REACTION: cough   Mometasone Furoate Other (See Comments)   (Nasonex) Causes nasal bleeding and nose bleeds      Medication List      atorvastatin 80 MG tablet Commonly known as:  LIPITOR Take 1 tablet by mouth  daily   Beclomethasone Dipropionate 80 MCG/ACT Aers Commonly known as:  QNASL Place 2 puffs into the nose daily.   CALCIUM PO Take 600 mg by mouth 2 (two) times daily.   carvedilol 25 MG tablet Commonly known as:  COREG Take two tablets (50 mg) by mouth twice daily   dofetilide 500 MCG capsule Commonly known as:  TIKOSYN Take 1 capsule by mouth  twice daily   ELIQUIS 5 MG Tabs tablet Generic drug:  apixaban Take 1 tablet  by mouth two  times daily   fenofibrate 160 MG tablet Take 1 tablet (160 mg total) by mouth daily.   fexofenadine 180 MG tablet Commonly known as:  ALLEGRA Take 180 mg by mouth daily.   Fish Oil 1200 MG Caps Take 1,200 mg by mouth daily.   Fluocinonide Emulsified Base 0.05 % Crea APPLY TO AFFECTED AREA(S)  TWO TIMES DAILY AS NEEDED   FOLIC ACID PO Take 1 tablet by mouth daily.   GLUCOSAMINE CHONDROITIN COMPLX PO Take 1 tablet by mouth 2 (two) times daily.   losartan 25 MG tablet Commonly known as:  COZAAR take 1 tablet by mouth once daily   Lysine 500 MG Tabs Take 500 mg by mouth 3 (three) times daily.   Magnesium 250 MG Tabs Take 250 mg by mouth 2 (two) times daily.   montelukast 10 MG tablet Commonly known as:  SINGULAIR Take 1 tablet (10 mg total) by mouth at bedtime.   potassium chloride 10 MEQ tablet Commonly known as:  K-DUR,KLOR-CON Take 1 tablet (10 mEq total) by mouth 2 (two) times daily.   ranitidine 300 MG tablet Commonly known as:  ZANTAC Take 1 tablet (300 mg total) by mouth at bedtime.   triamcinolone 55 MCG/ACT Aero nasal inhaler Commonly known as:  NASACORT Place 2 sprays into both nostrils daily.   VITAMIN  B 12 PO Take 1 tablet by mouth daily.   zolpidem 10 MG tablet Commonly known as:  AMBIEN Take 0.5 tablets (5 mg total) by mouth at bedtime as needed for sleep.       Past Medical History:  Diagnosis Date  . Allergic rhinitis    takes Allegra daily  . Allergic rhinitis, cause unspecified 06/22/2012  . Arthritis   . Atrial fibrillation ([place])    takes Tikosyn and Eliquis daily  . 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 Coronary artery disease    LAD stenting 2004 non-DES  . Depression    took Zoloft 68yrs ago but nothing now  . Diverticulosis   . DVT (deep venous thrombosis) ([place])   . Dysphagia    occasionally  . History of blood clots 2003   left leg prior to fem pop  . History of colon polyps   . Hyperlipidemia     takes Tricor and Lipitor daily  . Insomnia    takes Ambien nightly as needed  . Joint pain   . Joint swelling   . 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 OSA (obstructive sleep apnea)    CPAP  . Pacemaker   . Peripheral vascular disease ([place])   . Pleurisy    early 43's  . Urinary urgency     Past Surgical History:  Procedure Laterality Date  . CARDIAC CATHETERIZATION  2004  . CARDIAC CATHETERIZATION N/A 05/06/2015   Procedure: Left Heart Cath and Coronary Angiography;  Surgeon: Belva Crome, MD;  Location: [place] [facility];  Service: Cardiovascular;  Laterality: N/A;  . COLONOSCOPY    . hand and arm surgery Right    as a teenager  . HAND SURGERY Left   . INGUINAL HERNIA REPAIR Right   . JOINT REPLACEMENT Left December 09, 2013   Left Hip   . JOINT REPLACEMENT Right Aug. 25, 2015   Right Hip  . left knee surgery    . PACEMAKER INSERTION     due to bradycardia  . PERMANENT PACEMAKER GENERATOR CHANGE N/A 06/17/2012   Procedure: PERMANENT PACEMAKER GENERATOR CHANGE;  Surgeon: Deboraha Sprang, MD;  [facility] CATH LAB;  Service: Cardiovascular;  Laterality: N/A;  . right and left fem-pop bypass    . stent (unspec)     x2  . TOTAL HIP ARTHROPLASTY Left 12/09/2013   Procedure: TOTAL HIP ARTHROPLASTY ANTERIOR APPROACH;  Surgeon: Hessie Dibble, MD;  Location: [place];  Service: Orthopedics;  Laterality: Left;  left anterior total hip arthroplasty  . TOTAL HIP ARTHROPLASTY Right 05/05/2014   Procedure: TOTAL HIP ARTHROPLASTY ANTERIOR APPROACH;  Surgeon: Hessie Dibble, MD;  Location: [place];  Service: Orthopedics;  Laterality: Right;  . TURBINATE REDUCTION    . wisdom extracted       Review of systems negative except as noted in HPI / PMHx or noted below:  Review of Systems  Constitutional: Negative.   HENT: Negative.   Eyes: Negative.   Respiratory: Negative.   Cardiovascular: Negative.   Gastrointestinal: Negative.   Genitourinary: Negative.     Musculoskeletal: Negative.   Skin: Negative.   Neurological: Negative.   Endo/Heme/Allergies: Negative.   Psychiatric/Behavioral: Negative.      Objective:   Vitals:   09/06/16 1059  BP: 112/70  Pulse: 67  Resp: 20  Temp: 97.9 F (36.6 C)          Physical Exam  Constitutional:  He is well-developed, well-nourished, and in no distress.  HENT:  Head: Normocephalic.  Right Ear: Tympanic membrane, external ear and ear canal normal.  Left Ear: Tympanic membrane, external ear and ear canal normal.  Nose: Nose normal. No mucosal edema or rhinorrhea.  Mouth/Throat: Uvula is midline, oropharynx is clear and moist and mucous membranes are normal. No oropharyngeal exudate.  Septal perforation  Eyes: Conjunctivae are normal.  Neck: Trachea normal. No tracheal tenderness present. No tracheal deviation present. No thyromegaly present.  Cardiovascular: Normal rate, regular rhythm, S1 normal, S2 normal and normal heart sounds.   No murmur heard. Pulmonary/Chest: Breath sounds normal. No stridor. No respiratory distress. He has no wheezes. He has no rales.  Musculoskeletal: He exhibits no edema.  Lymphadenopathy:       Head (right side): No tonsillar adenopathy present.       Head (left side): No tonsillar adenopathy present.    He has no cervical adenopathy.  Neurological: He is alert. Gait normal.  Skin: No rash noted. He is not diaphoretic. No erythema. Nails show no clubbing.  Psychiatric: Mood and affect normal.    Diagnostics: None   Assessment and Plan:   1. Other allergic rhinitis   2. LPRD (laryngopharyngeal reflux disease)   3. Nasal septal perforation     1. Treat and prevent inflammation:   A. Discontinue montelukast  B. Qnasl 80 - 2 puffs each nostril one time per day  2. Treat and prevent reflux:   A. consolidate caffeine use through the day  B. ranitidine 300 mg tablet 1 time per day  3. If needed:   A. nasal saline spray  B. OTC antihistamine -  Zyrtec/Allegra/Claritin  4. Return to clinic in 8-10 weeks or earlier if problem  Keylon is doing better and I will continue to have him use anti-inflammatory agents for his upper airway and therapy directed against his LPR as noted above. We need to work through the issue of whether or not it is the ranitidine or the montelukast giving rise to his GI issue and he will let me know over the course of the next several days if he can tolerate ranitidine. If not, he will need to use a proton pump inhibitor. I will regroup with him in about 8-10 weeks and make a decision about further evaluation and treatment pending his response. I also had a talk with him today about possibly consolidating his caffeine a little bit more during this interval.    Allena Katz, MD [place]

## 2016-10-16 ENCOUNTER — Encounter (HOSPITAL_COMMUNITY): Payer: Self-pay | Admitting: Radiology

## 2016-10-24 ENCOUNTER — Other Ambulatory Visit: Payer: Self-pay | Admitting: Internal Medicine

## 2016-10-26 ENCOUNTER — Other Ambulatory Visit: Payer: Self-pay | Admitting: Internal Medicine

## 2016-12-04 ENCOUNTER — Other Ambulatory Visit: Payer: Self-pay | Admitting: *Deleted

## 2016-12-04 MED ORDER — BECLOMETHASONE DIPROPIONATE 80 MCG/ACT NA AERS: 2.0000 | INHALATION_SPRAY | Freq: Every day | NASAL | 2 refills | Status: DC

## 2016-12-05 ENCOUNTER — Ambulatory Visit (INDEPENDENT_AMBULATORY_CARE_PROVIDER_SITE_OTHER): Payer: Medicare Other | Admitting: Allergy and Immunology

## 2016-12-05 ENCOUNTER — Encounter: Payer: Self-pay | Admitting: Allergy and Immunology

## 2016-12-05 ENCOUNTER — Other Ambulatory Visit: Payer: Self-pay | Admitting: Internal Medicine

## 2016-12-05 VITALS — BP 120/64 | HR 72 | Resp 16

## 2016-12-05 DIAGNOSIS — K219 Gastro-esophageal reflux disease without esophagitis: Secondary | ICD-10-CM | POA: Diagnosis not present

## 2016-12-05 DIAGNOSIS — J3089 Other allergic rhinitis: Secondary | ICD-10-CM

## 2016-12-05 DIAGNOSIS — J3489 Other specified disorders of nose and nasal sinuses: Secondary | ICD-10-CM | POA: Diagnosis not present

## 2016-12-05 MED ORDER — MUPIROCIN CALCIUM 2 % NA OINT: 1.0000 "application " | TOPICAL_OINTMENT | Freq: Two times a day (BID) | NASAL | 0 refills | Status: DC

## 2016-12-05 NOTE — Patient Instructions (Addendum)
  1. Treat and prevent inflammation:   A. Montelukast 10mg  daily  B. Qnasl 80 - 1-2 puffs each nostril one time per day (avoid septum)  2. Treat and prevent reflux:   A. Remain away from caffeine  B. ranitidine 300 mg tablet 1 time per day  3. If needed:   A. nasal saline spray  B. OTC antihistamine - Zyrtec/Allegra/Claritin  4. Nasal saline followed by bactroban ointment to septum two times per day until healed. If still with bleeding revisit with Dr. Lucia Gaskins  5. Return to clinic in summer 2018 or earlier if problem

## 2016-12-05 NOTE — Progress Notes (Signed)
Follow-up Note  Referring Provider: Biagio Borg, MD Primary Provider: Cathlean Cower, MD Date of Office Visit: 12/05/2016  Subjective:   [name] (DOB: 03/05/1941) is a 76 y.o. male who returns to the [facility] on 12/05/2016 in re-evaluation of the following:  HPI: [name] returns to this clinic in reevaluation of [name] rhinitis and LPR. [name] last saw [name] in this clinic in December 2017.  [name] is now caffeine free and has been using [name] ranitidine consistently. Because of an insurance issue [name] had to discontinue [name] Qnasl and has been without this for at least a month or so.  [name] is really much better regarding all [name] nasal issues and [name] postnasal drip and the sensation that there is something stuck in [name] palate and [name] throat clearing and cough. [name] is close to 80% better regarding all these issues.  [name] did work through the issue of whether or not [name] medications were given rise to diarrhea and what [name] has found is that if [name] takes montelukast and ranitidine as a split dose [name] does not have any diarrhea.  [name] septal perforation appears to be bleeding somewhat on the very top. [name] has had cautery of the septal perforation by [name] in the past. [name] is on a blood thinner.  Allergies as of 12/05/2016      Reactions   Amiodarone    REACTION: peripheral neuropathy   Niacin    REACTION: irregular heartbeat   Ace Inhibitors    REACTION: cough   Mometasone Furoate Other (See Comments)   (Nasonex) Causes nasal bleeding and nose bleeds      Medication List      atorvastatin 80 MG tablet Commonly known as:  LIPITOR TAKE 1 TABLET BY MOUTH  DAILY   Beclomethasone Dipropionate 80 MCG/ACT Aers Commonly known as:  QNASL Place 2 puffs into the nose daily.   CALCIUM PO Take 600 mg by mouth 2 (two) times daily.   carvedilol 25 MG tablet Commonly known as:  COREG Take two tablets (50 mg) by mouth twice daily   dofetilide 500 MCG capsule Commonly known as:   TIKOSYN Take 1 capsule by mouth  twice daily   ELIQUIS 5 MG Tabs tablet Generic drug:  apixaban Take 1 tablet by mouth two  times daily   fenofibrate 160 MG tablet TAKE 1 TABLET BY MOUTH  DAILY   fexofenadine 180 MG tablet Commonly known as:  ALLEGRA Take 180 mg by mouth daily.   Fish Oil 1200 MG Caps Take 1,200 mg by mouth daily.   Fluocinonide Emulsified Base 0.05 % Crea APPLY TO AFFECTED AREA(S)  TWO TIMES DAILY AS NEEDED   FOLIC ACID PO Take 1 tablet by mouth daily.   GLUCOSAMINE CHONDROITIN COMPLX PO Take 1 tablet by mouth 2 (two) times daily.   losartan 25 MG tablet Commonly known as:  COZAAR take 1 tablet by mouth once daily   Lysine 500 MG Tabs Take 500 mg by mouth 3 (three) times daily.   Magnesium 250 MG Tabs Take 250 mg by mouth 2 (two) times daily.   montelukast 10 MG tablet Commonly known as:  SINGULAIR Take 1 tablet (10 mg total) by mouth at bedtime.   potassium chloride 10 MEQ tablet Commonly known as:  K-DUR,KLOR-CON Take 1 tablet (10 mEq total) by mouth 2 (two) times daily.   ranitidine 300 MG tablet Commonly known as:  ZANTAC Take 1 tablet (300 mg total) by mouth  at bedtime.   triamcinolone 55 MCG/ACT Aero nasal inhaler Commonly known as:  NASACORT Place 2 sprays into both nostrils daily.   VITAMIN B 12 PO Take 1 tablet by mouth daily.   zolpidem 10 MG tablet Commonly known as:  AMBIEN Take 0.5 tablets (5 mg total) by mouth at bedtime as needed for sleep.       Past Medical History:  Diagnosis Date  . Allergic rhinitis    takes Allegra daily  . Allergic rhinitis, cause unspecified 06/22/2012  . Arthritis   . Atrial fibrillation ([place])    takes Tikosyn and Eliquis daily  . 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 Coronary artery disease    LAD stenting 2004 non-DES  . Depression    took Zoloft 37yrs ago but nothing now  . Diverticulosis   . DVT (deep venous thrombosis) ([place])   . Dysphagia    occasionally  . History of  blood clots 2003   left leg prior to fem pop  . History of colon polyps   . Hyperlipidemia    takes Tricor and Lipitor daily  . Insomnia    takes Ambien nightly as needed  . Joint pain   . Joint swelling   . 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 OSA (obstructive sleep apnea)    CPAP  . Pacemaker   . Peripheral vascular disease ([place])   . Pleurisy    early 73's  . Urinary urgency     Past Surgical History:  Procedure Laterality Date  . CARDIAC CATHETERIZATION  2004  . CARDIAC CATHETERIZATION N/A 05/06/2015   Procedure: Left Heart Cath and Coronary Angiography;  Surgeon: Belva Crome, MD;  Location: [facility];  Service: Cardiovascular;  Laterality: N/A;  . COLONOSCOPY    . hand and arm surgery Right    as a teenager  . HAND SURGERY Left   . INGUINAL HERNIA REPAIR Right   . JOINT REPLACEMENT Left December 09, 2013   Left Hip   . JOINT REPLACEMENT Right Aug. 25, 2015   Right Hip  . left knee surgery    . PACEMAKER INSERTION     due to bradycardia  . PERMANENT PACEMAKER GENERATOR CHANGE N/A 06/17/2012   Procedure: PERMANENT PACEMAKER GENERATOR CHANGE;  Surgeon: Deboraha Sprang, MD;  Location: [facility] CATH LAB;  Service: Cardiovascular;  Laterality: N/A;  . right and left fem-pop bypass    . stent (unspec)     x2  . TOTAL HIP ARTHROPLASTY Left 12/09/2013   Procedure: TOTAL HIP ARTHROPLASTY ANTERIOR APPROACH;  Surgeon: Hessie Dibble, MD;  Location: [facility];  Service: Orthopedics;  Laterality: Left;  left anterior total hip arthroplasty  . TOTAL HIP ARTHROPLASTY Right 05/05/2014   Procedure: TOTAL HIP ARTHROPLASTY ANTERIOR APPROACH;  Surgeon: Hessie Dibble, MD;  Location: [facility];  Service: Orthopedics;  Laterality: Right;  . TURBINATE REDUCTION    . wisdom extracted       Review of systems negative except as noted in HPI / PMHx or noted below:  Review of Systems  Constitutional: Negative.   HENT: Negative.   Eyes: Negative.   Respiratory:  Negative.   Cardiovascular: Negative.   Gastrointestinal: Negative.   Genitourinary: Negative.   Musculoskeletal: Negative.   Skin: Negative.   Neurological: Negative.   Endo/Heme/Allergies: Negative.   Psychiatric/Behavioral: Negative.      Objective:   Vitals:   12/05/16 1150  BP: 120/64  Pulse: 72  Resp: 16          Physical Exam  Constitutional: [name] is well-developed, well-nourished, and in no distress.  HENT:  Head: Normocephalic.  Right Ear: Tympanic membrane, external ear and ear canal normal.  Left Ear: Tympanic membrane, external ear and ear canal normal.  Nose: No mucosal edema or rhinorrhea. Epistaxis (Septal perforation with areas of bleeding superior location) is observed.  Mouth/Throat: Uvula is midline, oropharynx is clear and moist and mucous membranes are normal. No oropharyngeal exudate.  Eyes: Conjunctivae are normal.  Neck: Trachea normal. No tracheal tenderness present. No tracheal deviation present. No thyromegaly present.  Cardiovascular: Normal rate, regular rhythm, S1 normal, S2 normal and normal heart sounds.   No murmur heard. Pulmonary/Chest: Breath sounds normal. No stridor. No respiratory distress. [name] has no wheezes. [name] has no rales.  Musculoskeletal: [name] exhibits no edema.  Lymphadenopathy:       Head (right side): No tonsillar adenopathy present.       Head (left side): No tonsillar adenopathy present.    [name] has no cervical adenopathy.  Neurological: [name] is alert. Gait normal.  Skin: No rash noted. [name] is not diaphoretic. No erythema. Nails show no clubbing.  Psychiatric: Mood and affect normal.    Diagnostics: none  Assessment and Plan:   1. Other allergic rhinitis   2. Nasal septal perforation   3. LPRD (laryngopharyngeal reflux disease)     1. Treat and prevent inflammation:   A. Montelukast 10mg  daily  B. Qnasl 80 - 1-2 puffs each nostril one time per day (avoid septum)  2. Treat and prevent reflux:   A. Remain away from  caffeine  B. ranitidine 300 mg tablet 1 time per day  3. If needed:   A. nasal saline spray  B. OTC antihistamine - Zyrtec/Allegra/Claritin  4. Nasal saline followed by bactroban ointment to septum two times per day until healed. If still with bleeding revisit with [name]  5. Return to clinic in summer 2018 or earlier if problem  [name] is doing much better regarding [name] LPR, cough, and rhinitis and we'll keep [name] on the plan mentioned above. [name] have asked [name] to utilize nasal saline followed by Bactroban ointment to [name] septum 2-3 times per day in the hope that this will result in healing and decreased epistaxis. If not, [name] will need to go back to see [name] ENT doctor for further management of this issue.  Allena Katz, MD Allergy / Immunology [place]

## 2016-12-19 ENCOUNTER — Ambulatory Visit (HOSPITAL_COMMUNITY): Payer: Medicare Other | Attending: Internal Medicine

## 2016-12-19 ENCOUNTER — Encounter: Payer: Self-pay | Admitting: Internal Medicine

## 2016-12-19 ENCOUNTER — Other Ambulatory Visit: Payer: Self-pay

## 2016-12-19 DIAGNOSIS — I350 Nonrheumatic aortic (valve) stenosis: Secondary | ICD-10-CM | POA: Diagnosis not present

## 2016-12-19 DIAGNOSIS — I480 Paroxysmal atrial fibrillation: Secondary | ICD-10-CM | POA: Insufficient documentation

## 2016-12-19 DIAGNOSIS — I340 Nonrheumatic mitral (valve) insufficiency: Secondary | ICD-10-CM | POA: Diagnosis not present

## 2016-12-19 LAB — ECHOCARDIOGRAM COMPLETE
AV Area VTI index: 0.97 cm2/m2
AV Mean grad: 8 mmHg
AV Peak grad: 16 mmHg
AV VEL mean LVOT/AV: 0.55
AV pk vel: 198 cm/s
AV vel: 2.18
AVAREAMEANV: 2.11 cm2
AVAREAMEANVIN: 0.94 cm2/m2
AVAREAVTI: 1.84 cm2
Ao pk vel: 0.48 m/s
Ao-asc: 38 cm
Area-P 1/2: 4.23 cm2
CHL CUP AV PEAK INDEX: 0.82
CHL CUP MV DEC (S): 176
CHL CUP TV REG PEAK VELOCITY: 265 cm/s
DOP CAL AO MEAN VELOCITY: 128 cm/s
EERAT: 23.36
EWDT: 176 ms
FS: 20 % — AB (ref 28–44)
IVS/LV PW RATIO, ED: 0.86
LA ID, A-P, ES: 41 mm
LA diam index: 1.82 cm/m2
LA vol A4C: 41.1 ml
LA vol index: 19.5 mL/m2
LA vol: 43.9 mL
LDCA: 3.8 cm2
LEFT ATRIUM END SYS DIAM: 41 mm
LV E/e' medial: 23.36
LV E/e'average: 23.36
LV PW d: 14 mm — AB (ref 0.6–1.1)
LV TDI E'LATERAL: 3.3
LV TDI E'MEDIAL: 5.22
LV e' LATERAL: 3.3 cm/s
LVOT VTI: 23.5 cm
LVOTD: 22 mm
LVOTPV: 95.7 cm/s
LVOTSV: 89 mL
LVOTVTI: 0.57 cm
MV pk A vel: 88.6 m/s
MVPG: 2 mmHg
MVPKEVEL: 77.1 m/s
P 1/2 time: 52 ms
RV LATERAL S' VELOCITY: 17.4 cm/s
TAPSE: 34.5 mm
TRMAXVEL: 265 cm/s
VTI: 40.9 cm
Valve area index: 0.97
Valve area: 2.18 cm2

## 2016-12-21 ENCOUNTER — Encounter: Payer: Self-pay | Admitting: Internal Medicine

## 2016-12-21 ENCOUNTER — Ambulatory Visit (INDEPENDENT_AMBULATORY_CARE_PROVIDER_SITE_OTHER): Payer: Medicare Other | Admitting: Internal Medicine

## 2016-12-21 VITALS — BP 112/60 | HR 80 | Ht 75.0 in | Wt 215.0 lb

## 2016-12-21 DIAGNOSIS — I447 Left bundle-branch block, unspecified: Secondary | ICD-10-CM | POA: Diagnosis not present

## 2016-12-21 DIAGNOSIS — Z950 Presence of cardiac pacemaker: Secondary | ICD-10-CM | POA: Diagnosis not present

## 2016-12-21 DIAGNOSIS — I480 Paroxysmal atrial fibrillation: Secondary | ICD-10-CM

## 2016-12-21 DIAGNOSIS — I428 Other cardiomyopathies: Secondary | ICD-10-CM | POA: Diagnosis not present

## 2016-12-21 DIAGNOSIS — I495 Sick sinus syndrome: Secondary | ICD-10-CM | POA: Diagnosis not present

## 2016-12-21 NOTE — Patient Instructions (Addendum)
Medication Instructions:  Your physician recommends that you continue on your current medications as directed. Please refer to the Current Medication list given to you today.   Labwork: Your physician recommends that you return for lab work tomorrow for [employer], Magnesium, CBC   Testing/Procedures: None ordered  Follow-Up: Your physician wants you to follow-up in: 6 months with Dr. Caryl Comes. You will receive a reminder letter in the mail two months in advance. If you don't receive a letter, please call our office to schedule the follow-up appointment.   Any Other Special Instructions Will Be Listed Below (If Applicable).     If you need a refill on your cardiac medications before your next appointment, please call your pharmacy.

## 2016-12-21 NOTE — Progress Notes (Signed)
Patient Care Team: Biagio Borg, MD as PCP - General (Internal Medicine) Deboraha Sprang, MD (Cardiology) Melrose Nakayama, MD as Consulting Physician (Orthopedic Surgery)   HPI  [name] is a 76 y.o. 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   His pacemaker reached ERI 8/13 and he underwent generator replacement with a Biotronik device because of his CLS function  He takes dofetilide for his atrial fibrillation    At his last visit in August he was noted to have new left bundle branch block. Assessment of LV function surprisingly demonstrated significant impairment in LV function 30-35% and he subsequently underwent catheterization demonstrating modest nonobstructive disease confirming LV dysfunction  >>Diffuse noncritical coronary disease involving each of the 3 major coronaries. Both the mid RCA and mid LAD contained up to 65% stenosis. The previously placed proximal LAD stent is widely patent. The small first diagonal contains 90% stenosis and is jailed by the stent.     DATE TEST    8/16 Echo EF 30-35%   3/17 Echo EF 35-40%   6 /17  Echo   EF 40-45 %   10/17 Echo  EF 40-45  %   4/18 Echo Ef 45%           The patient denies    chest pain, shortness of breath, nocturnal dyspnea, orthopnea or peripheral edema.  There have been scant  Palpitations, but no lightheadedness or syncope.         Past Medical History:  Diagnosis Date  . Allergic rhinitis    takes Allegra daily  . Allergic rhinitis, cause unspecified 06/22/2012  . Arthritis   . Atrial fibrillation ([place])    takes Tikosyn and Eliquis daily  . 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 Coronary artery disease    LAD stenting 2004 non-DES  . Depression    took Zoloft 84yrs ago but nothing now  . Diverticulosis   . DVT (deep venous thrombosis) ([place])   . Dysphagia    occasionally  . History of blood clots 2003   left leg prior to fem pop  . History of colon polyps   . Hyperlipidemia    takes Tricor and Lipitor daily  . Insomnia    takes Ambien nightly as needed  . Joint pain   . Joint swelling   . Neuropathy ([place])    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  . OSA (obstructive sleep apnea)    CPAP  . Pacemaker   . Peripheral vascular disease ([place])   . Pleurisy    early 92's  . Urinary urgency     Past Surgical History:  Procedure Laterality Date  . CARDIAC CATHETERIZATION  2004  . CARDIAC CATHETERIZATION N/A 05/06/2015   Procedure: Left Heart Cath and Coronary Angiography;  Surgeon: Belva Crome, MD;  Location: [facility];  Service: Cardiovascular;  Laterality: N/A;  . COLONOSCOPY    . hand and arm surgery Right    as a teenager  . HAND SURGERY Left   . INGUINAL HERNIA REPAIR Right   . JOINT REPLACEMENT Left December 09, 2013   Left Hip   . JOINT REPLACEMENT Right Aug. 25, 2015   Right Hip  . left knee surgery    . PACEMAKER INSERTION     due to bradycardia  . PERMANENT PACEMAKER GENERATOR CHANGE N/A 06/17/2012  Procedure: PERMANENT PACEMAKER GENERATOR CHANGE;  Surgeon: Deboraha Sprang, MD;  Location: [facility] CATH LAB;  Service: Cardiovascular;  Laterality: N/A;  . right and left fem-pop bypass    . stent (unspec)     x2  . TOTAL HIP ARTHROPLASTY Left 12/09/2013   Procedure: TOTAL HIP ARTHROPLASTY ANTERIOR APPROACH;  Surgeon: Hessie Dibble, MD;  Location: [facility];  Service: Orthopedics;  Laterality: Left;  left anterior total hip arthroplasty  . TOTAL HIP ARTHROPLASTY Right 05/05/2014   Procedure: TOTAL HIP ARTHROPLASTY ANTERIOR APPROACH;  Surgeon: Hessie Dibble, MD;  Location: [facility];  Service: Orthopedics;  Laterality: Right;  . TURBINATE REDUCTION    . wisdom extracted       Current Outpatient Prescriptions  Medication Sig Dispense Refill  . atorvastatin (LIPITOR) 80 MG tablet TAKE 1 TABLET BY MOUTH  DAILY 90 tablet 2  .  Beclomethasone Dipropionate (QNASL) 80 MCG/ACT AERS Place 2 puffs into the nose daily. 3 Inhaler 2  . CALCIUM PO Take 600 mg by mouth 2 (two) times daily.    . carvedilol (COREG) 25 MG tablet Take two tablets (50 mg) by mouth twice daily 360 tablet 3  . Cyanocobalamin (VITAMIN B 12 PO) Take 1 tablet by mouth daily.    Marland Kitchen dofetilide (TIKOSYN) 500 MCG capsule TAKE 1 CAPSULE BY MOUTH  TWICE DAILY 180 capsule 1  . ELIQUIS 5 MG TABS tablet Take 1 tablet by mouth two  times daily 180 tablet 2  . fenofibrate 160 MG tablet TAKE 1 TABLET BY MOUTH  DAILY 90 tablet 2  . fexofenadine (ALLEGRA) 180 MG tablet Take 180 mg by mouth daily.    . Fluocinonide Emulsified Base 0.05 % CREA APPLY TO AFFECTED AREA(S)  TWO TIMES DAILY AS NEEDED 60 g 0  . FOLIC ACID PO Take 1 tablet by mouth daily.    Marland Kitchen GLUCOSAMINE CHONDROITIN COMPLX PO Take 1 tablet by mouth 2 (two) times daily.     Marland Kitchen losartan (COZAAR) 25 MG tablet take 1 tablet by mouth once daily 90 tablet 2  . Lysine 500 MG TABS Take 500 mg by mouth 3 (three) times daily.    . Magnesium 250 MG TABS Take 250 mg by mouth 2 (two) times daily.     . montelukast (SINGULAIR) 10 MG tablet Take 1 tablet (10 mg total) by mouth at bedtime. 30 tablet 5  . mupirocin nasal ointment (BACTROBAN NASAL) 2 % Place 1 application into the nose 2 (two) times daily. After application, press sides of nose together and gently massage. 10 g 0  . Omega-3 Fatty Acids (FISH OIL) 1200 MG CAPS Take 1,200 mg by mouth daily.     . potassium chloride (K-DUR,KLOR-CON) 10 MEQ tablet Take 1 tablet (10 mEq total) by mouth 2 (two) times daily. 180 tablet 3  . ranitidine (ZANTAC) 300 MG tablet Take 1 tablet (300 mg total) by mouth at bedtime. 30 tablet 5  . triamcinolone (NASACORT) 55 MCG/ACT AERO nasal inhaler Place 2 sprays into both nostrils daily.     Marland Kitchen zolpidem (AMBIEN) 10 MG tablet Take 0.5 tablets (5 mg total) by mouth at bedtime as needed for sleep. 90 tablet 1   No current facility-administered  medications for this visit.     Allergies  Allergen Reactions  . Amiodarone     REACTION: peripheral neuropathy  . Niacin     REACTION: irregular heartbeat  . Ace Inhibitors     REACTION: cough  . Mometasone Furoate Other (See  Comments)    (Nasonex) Causes nasal bleeding and nose bleeds    Review of Systems negative except from HPI and PMH  Physical Exam BP 112/60   Pulse 80   Ht 6\' 3"  (1.905 m)   Wt 215 lb (97.5 kg)   SpO2 96%   BMI 26.87 kg/m  Well developed and well nourished in no acute distress HENT normal E scleral and icterus clear Neck Supple JVP flat; carotids brisk and full Clear to ausculation  *Regular rate and rhythm, no murmurs gallops or rub Soft with active bowel sounds No clubbing cyanosis non-pitting and none Edema Alert and oriented, grossly normal motor and sensory function Skin Warm and Dry  ECG demonstrates atrial pacing 67 Intervals 20/13/46 Left bundle branch block     Assessment and  Plan  Atrial fibrillation-paroxysmal  Symptomatic ventricular pacing  Pacemaker Biotronik The patient's device was interrogated and the information was fully reviewed.  The device was reprogrammed as above  LBBB -question rate related  Nonischemic cardiomyopathy  Coronary artery disease A) prior stenting B) diffuse nonobstructive disease    Atrial fib burden is less about 2-3%  We will plan to reassess LV function in about 6 months.    Will look into aldactone for mild LV dysfunction   CCould his LBBB be aggravating his LV dysfunction   Without symptoms of ischemia   We spent more than 50% of our >25 min visit in face to face counseling regarding the above

## 2016-12-22 ENCOUNTER — Other Ambulatory Visit: Payer: Medicare Other | Admitting: *Deleted

## 2016-12-22 DIAGNOSIS — Z950 Presence of cardiac pacemaker: Secondary | ICD-10-CM

## 2016-12-22 DIAGNOSIS — I495 Sick sinus syndrome: Secondary | ICD-10-CM

## 2016-12-23 LAB — BASIC METABOLIC PANEL
BUN / CREAT RATIO: 19 (ref 10–24)
BUN: 21 mg/dL (ref 8–27)
CALCIUM: 9.8 mg/dL (ref 8.6–10.2)
CHLORIDE: 104 mmol/L (ref 96–106)
CO2: 25 mmol/L (ref 18–29)
Creatinine, Ser: 1.08 mg/dL (ref 0.76–1.27)
GFR calc Af Amer: 76 mL/min/{1.73_m2} (ref >59)
GFR calc non Af Amer: 66 mL/min/{1.73_m2} (ref >59)
GLUCOSE: 81 mg/dL (ref 65–99)
POTASSIUM: 4.7 mmol/L (ref 3.5–5.2)
Sodium: 144 mmol/L (ref 134–144)

## 2016-12-23 LAB — CBC
HEMOGLOBIN: 13.8 g/dL (ref 13.0–17.7)
Hematocrit: 41.3 % (ref 37.5–51.0)
MCH: 31.9 pg (ref 26.6–33.0)
MCHC: 33.4 g/dL (ref 31.5–35.7)
MCV: 96 fL (ref 79–97)
PLATELETS: 183 10*3/uL (ref 150–379)
RBC: 4.32 x10E6/uL (ref 4.14–5.80)
RDW: 13.9 % (ref 12.3–15.4)
WBC: 5.8 10*3/uL (ref 3.4–10.8)

## 2016-12-23 LAB — MAGNESIUM: MAGNESIUM: 2.1 mg/dL (ref 1.6–2.3)

## 2016-12-25 DIAGNOSIS — M25552 Pain in left hip: Secondary | ICD-10-CM | POA: Diagnosis not present

## 2016-12-27 ENCOUNTER — Telehealth: Payer: Self-pay | Admitting: Emergency Medicine

## 2016-12-27 NOTE — Telephone Encounter (Signed)
Spoke with representative at [facility] regarding pre-authorization for Tikosyn 500 mcg, prior authorization completed personally and sent to pharmacist for review.  Pre-authorization number: [id number]

## 2016-12-28 ENCOUNTER — Telehealth: Payer: Self-pay

## 2016-12-28 NOTE — Telephone Encounter (Signed)
Prior auth for Dofetilide 574mcg submitted via fax to [facility].

## 2017-01-09 ENCOUNTER — Encounter: Payer: Medicare Other | Admitting: Internal Medicine

## 2017-01-11 ENCOUNTER — Other Ambulatory Visit: Payer: Self-pay | Admitting: Internal Medicine

## 2017-01-15 ENCOUNTER — Encounter: Payer: Self-pay | Admitting: Internal Medicine

## 2017-01-15 MED ORDER — DOFETILIDE 500 MCG PO CAPS: 500.0000 ug | ORAL_CAPSULE | Freq: Two times a day (BID) | ORAL | 1 refills | Status: DC

## 2017-01-18 ENCOUNTER — Other Ambulatory Visit: Payer: Self-pay | Admitting: Internal Medicine

## 2017-01-18 ENCOUNTER — Other Ambulatory Visit: Payer: Self-pay | Admitting: Allergy and Immunology

## 2017-01-18 NOTE — Telephone Encounter (Signed)
Age 76 Wt 97.5kg  12/21/2016 Saw Dr Caryl Comes on 12/21/2016 12/22/2016 Hgb 13.8 HCT 41.3 SrCr 1.08 Refill done for Eliquis 5mg  q 12 hours as requested

## 2017-03-08 ENCOUNTER — Other Ambulatory Visit: Payer: Self-pay | Admitting: *Deleted

## 2017-03-08 ENCOUNTER — Other Ambulatory Visit: Payer: Self-pay

## 2017-03-08 MED ORDER — MONTELUKAST SODIUM 10 MG PO TABS: 10.0000 mg | ORAL_TABLET | Freq: Every day | ORAL | 2 refills | Status: DC

## 2017-03-08 MED ORDER — RANITIDINE HCL 300 MG PO TABS: 300.0000 mg | ORAL_TABLET | Freq: Every day | ORAL | 2 refills | Status: DC

## 2017-03-08 MED ORDER — LOSARTAN POTASSIUM 25 MG PO TABS: 25.0000 mg | ORAL_TABLET | Freq: Every day | ORAL | 2 refills | Status: DC

## 2017-04-25 ENCOUNTER — Other Ambulatory Visit: Payer: Self-pay | Admitting: Internal Medicine

## 2017-05-03 DIAGNOSIS — D2271 Melanocytic nevi of right lower limb, including hip: Secondary | ICD-10-CM | POA: Diagnosis not present

## 2017-05-03 DIAGNOSIS — D692 Other nonthrombocytopenic purpura: Secondary | ICD-10-CM | POA: Diagnosis not present

## 2017-05-03 DIAGNOSIS — D225 Melanocytic nevi of trunk: Secondary | ICD-10-CM | POA: Diagnosis not present

## 2017-05-03 DIAGNOSIS — L812 Freckles: Secondary | ICD-10-CM | POA: Diagnosis not present

## 2017-05-03 DIAGNOSIS — L821 Other seborrheic keratosis: Secondary | ICD-10-CM | POA: Diagnosis not present

## 2017-06-06 ENCOUNTER — Other Ambulatory Visit: Payer: Self-pay | Admitting: Internal Medicine

## 2017-06-27 DIAGNOSIS — H2511 Age-related nuclear cataract, right eye: Secondary | ICD-10-CM | POA: Diagnosis not present

## 2017-06-27 DIAGNOSIS — H35371 Puckering of macula, right eye: Secondary | ICD-10-CM | POA: Diagnosis not present

## 2017-07-13 ENCOUNTER — Ambulatory Visit (HOSPITAL_COMMUNITY)
Admission: RE | Admit: 2017-07-13 | Discharge: 2017-07-13 | Disposition: A | Discharge status: Home / Self Care | Payer: Medicare Other | Source: Ambulatory Visit | Attending: Vascular Surgery | Admitting: Vascular Surgery

## 2017-07-13 ENCOUNTER — Encounter: Payer: Self-pay | Admitting: Family

## 2017-07-13 ENCOUNTER — Ambulatory Visit (INDEPENDENT_AMBULATORY_CARE_PROVIDER_SITE_OTHER)
Admission: RE | Admit: 2017-07-13 | Discharge: 2017-07-13 | Disposition: A | Discharge status: Home / Self Care | Payer: Medicare Other | Source: Ambulatory Visit | Attending: Vascular Surgery | Admitting: Vascular Surgery

## 2017-07-13 ENCOUNTER — Ambulatory Visit (INDEPENDENT_AMBULATORY_CARE_PROVIDER_SITE_OTHER): Payer: Medicare Other | Admitting: Family

## 2017-07-13 VITALS — BP 128/75 | HR 72 | Temp 98.0°F | Resp 16 | Ht 75.0 in | Wt 215.0 lb

## 2017-07-13 DIAGNOSIS — I724 Aneurysm of artery of lower extremity: Secondary | ICD-10-CM | POA: Diagnosis not present

## 2017-07-13 DIAGNOSIS — Z95828 Presence of other vascular implants and grafts: Secondary | ICD-10-CM | POA: Insufficient documentation

## 2017-07-13 DIAGNOSIS — R0989 Other specified symptoms and signs involving the circulatory and respiratory systems: Secondary | ICD-10-CM

## 2017-07-13 DIAGNOSIS — Z48812 Encounter for surgical aftercare following surgery on the circulatory system: Secondary | ICD-10-CM | POA: Diagnosis not present

## 2017-07-13 DIAGNOSIS — Z8249 Family history of ischemic heart disease and other diseases of the circulatory system: Secondary | ICD-10-CM | POA: Diagnosis not present

## 2017-07-13 NOTE — Progress Notes (Signed)
[facility]    CC: Follow up peripheral artery occlusive disease  History of Present Illness [name] is a 76 y.o. male patient of Dr. Amedeo Plenty, Dr. Kellie Simmering, then Dr. 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 He returns today for routine follow up. He denies claudication symptoms in his legs with walking, denies non healing wounds. He saw a dermatologist for his light lower leg rash, who treated him with a topical corticosteroid which resolved the rash.  He had both hips replaced at [facility] in 2015.  He has a pacemaker, history of atrial fib for which he also takes Eliquis. His father had a dissecting AAA, he had a sleeve surgical repair, he was a heavy smoker, did not have DM, was obese, per pt. Pt had a normal AAA Duplex in 2013. Pt denies back or abdominal pain. Pt denies any history of stroke or TIA.  He states that he may have a picnched nerve in his c-spine. He states his EF is 30%.  Pt Diabetic: No Pt smoker: non-smoker  Pt meds include: Statin :Yes ASA: no Other anticoagulants/antiplatelets: Eliquis for atrial fib. He has not had a DVT (is documented in his hx)   Past Medical History:  Diagnosis Date  . Allergic rhinitis    takes Allegra daily  . Allergic rhinitis, cause unspecified 06/22/2012  . Arthritis   . Atrial fibrillation ([place])    takes Tikosyn and Eliquis daily  . 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 Coronary artery disease    LAD stenting 2004 non-DES  . Depression    took Zoloft 95yrs ago but nothing now  . Diverticulosis   . DVT (deep venous thrombosis) ([place])   . Dysphagia    occasionally  . History of blood clots 2003   left leg prior to fem pop  . History of colon polyps   . Hyperlipidemia    takes Tricor and Lipitor daily  . Insomnia    takes Ambien nightly as needed  . Joint  pain   . Joint swelling   . 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 OSA (obstructive sleep apnea)    CPAP  . Pacemaker   . Peripheral vascular disease ([place])   . Pleurisy    early 6's  . Urinary urgency     Social History Social History  Substance Use Topics  . Smoking status: Never Smoker  . Smokeless tobacco: Never Used  . Alcohol use No     Comment: nothing since 2000    Family History Family History  Problem Relation Age of Onset  . Diabetes Other        family hx  . Sleep apnea Other        family hx  . Diabetes Mother   . Heart disease Mother        Before age 71  . Heart disease Father        Before age 12  . Heart attack Father   . Stroke Father   . Aneurysm Father        bilat pop art aneurysms  . Peripheral vascular disease Father        Popliteal Aneurysm  . Allergic rhinitis Neg Hx   . Angioedema Neg Hx   . Asthma Neg Hx   . Eczema Neg Hx   . Immunodeficiency Neg Hx   .  Urticaria Neg Hx     Past Surgical History:  Procedure Laterality Date  . CARDIAC CATHETERIZATION  2004  . CARDIAC CATHETERIZATION N/A 05/06/2015   Procedure: Left Heart Cath and Coronary Angiography;  Surgeon: Belva Crome, MD;  Location: [place] CV LAB;  Service: Cardiovascular;  Laterality: N/A;  . COLONOSCOPY    . hand and arm surgery Right    as a teenager  . HAND SURGERY Left   . INGUINAL HERNIA REPAIR Right   . JOINT REPLACEMENT Left December 09, 2013   Left Hip   . JOINT REPLACEMENT Right Aug. 25, 2015   Right Hip  . left knee surgery    . PACEMAKER INSERTION     due to bradycardia  . PERMANENT PACEMAKER GENERATOR CHANGE N/A 06/17/2012   Procedure: PERMANENT PACEMAKER GENERATOR CHANGE;  Surgeon: Deboraha Sprang, MD;  Location: [facility] C[facility] LAB;  Service: Cardiovascular;  Laterality: N/A;  . right and left fem-pop bypass    . stent (unspec)     x2  . TOTAL HIP ARTHROPLASTY Left 12/09/2013   Procedure: TOTAL HIP ARTHROPLASTY ANTERIOR APPROACH;  Surgeon:  Hessie Dibble, MD;  Location: [place];  Service: Orthopedics;  Laterality: Left;  left anterior total hip arthroplasty  . TOTAL HIP ARTHROPLASTY Right 05/05/2014   Procedure: TOTAL HIP ARTHROPLASTY ANTERIOR APPROACH;  Surgeon: Hessie Dibble, MD;  Location: [place];  Service: Orthopedics;  Laterality: Right;  . TURBINATE REDUCTION    . wisdom extracted       Allergies  Allergen Reactions  . Amiodarone     REACTION: peripheral neuropathy  . Niacin     REACTION: irregular heartbeat  . Ace Inhibitors     REACTION: cough  . Mometasone Furoate Other (See Comments)    (Nasonex) Causes nasal bleeding and nose bleeds    Current Outpatient Prescriptions  Medication Sig Dispense Refill  . atorvastatin (LIPITOR) 80 MG tablet TAKE 1 TABLET BY MOUTH  DAILY 90 tablet 2  . Beclomethasone Dipropionate (QNASL) 80 MCG/ACT AERS Place 2 puffs into the nose daily. 3 Inhaler 2  . CALCIUM PO Take 600 mg by mouth 2 (two) times daily.    . carvedilol (COREG) 25 MG tablet Take two tablets (50 mg) by mouth twice daily 360 tablet 3  . Cyanocobalamin (VITAMIN B 12 PO) Take 1 tablet by mouth daily.    Marland Kitchen dofetilide (TIKOSYN) 500 MCG capsule Take 1 capsule (500 mcg total) by mouth 2 (two) times daily. 180 capsule 1  . ELIQUIS 5 MG TABS tablet TAKE 1 TABLET BY MOUTH TWO  TIMES DAILY 180 tablet 1  . fenofibrate 160 MG tablet TAKE 1 TABLET BY MOUTH  DAILY 90 tablet 2  . fexofenadine (ALLEGRA) 180 MG tablet Take 180 mg by mouth daily.    . Fluocinonide Emulsified Base 0.05 % CREA APPLY TO AFFECTED AREA(S)  TWO TIMES DAILY AS NEEDED 60 g 1  . FOLIC ACID PO Take 1 tablet by mouth daily.    Marland Kitchen GLUCOSAMINE CHONDROITIN COMPLX PO Take 1 tablet by mouth 2 (two) times daily.     Marland Kitchen losartan (COZAAR) 25 MG tablet Take 1 tablet (25 mg total) by mouth daily. 90 tablet 2  . Lysine 500 MG TABS Take 500 mg by mouth 3 (three) times daily.    . Magnesium 250 MG TABS Take 250 mg by mouth 2 (two) times daily.     . montelukast  (SINGULAIR) 10 MG tablet Take 1 tablet (10 mg total)  by mouth at bedtime. 90 tablet 2  . mupirocin nasal ointment (BACTROBAN NASAL) 2 % Place 1 application into the nose 2 (two) times daily. After application, press sides of nose together and gently massage. 10 g 0  . Omega-3 Fatty Acids (FISH OIL) 1200 MG CAPS Take 1,200 mg by mouth daily.     . potassium chloride (K-DUR,KLOR-CON) 10 MEQ tablet Take 1 tablet (10 mEq total) by mouth 2 (two) times daily. 180 tablet 3  . ranitidine (ZANTAC) 300 MG tablet Take 1 tablet (300 mg total) by mouth at bedtime. 90 tablet 2  . triamcinolone (NASACORT) 55 MCG/ACT AERO nasal inhaler Place 2 sprays into both nostrils daily.     Marland Kitchen zolpidem (AMBIEN) 10 MG tablet Take 0.5 tablets (5 mg total) by mouth at bedtime as needed for sleep. 90 tablet 1   No current facility-administered medications for this visit.     ROS: See HPI for pertinent positives and negatives.   Physical Examination  Vitals:   07/13/17 1418 07/13/17 1419  BP: 125/72 128/75  Pulse: 72   Resp: 16   Temp: 98 F (36.7 C)   TempSrc: Oral   SpO2: 98%   Weight: 215 lb (97.5 kg)   Height: 6\' 3"  (1.905 m)    Body mass index is 26.87 kg/m.  General: A&O x 3, WDWN. Gait: normal Eyes: PERRLA. Pulmonary: Respirations are non labored, CTAB, without wheezes , rales or rhonchi. Cardiac: regular rythm, no detected murmur, pacemaker palpated left upper chest subcutaneously.     Carotid Bruits Right Left   Negative Negative   Abdominal aortic pulse is slightly palpable. Radial pulses: are 2+ palpable and =   VASCULAR EXAM: Extremitieswithout ischemic changes  without Gangrene; without open wounds. Multiple small varicosities at ankles.     LE Pulses Right Left   FEMORAL  4+palpable   4+palpable    POPLITEAL not palpable  not palpable   POSTERIOR TIBIAL 2+ palpable  2+ palpable    DORSALIS PEDIS  ANTERIOR TIBIAL 1+ palpable  faintly palpable    Abdomen: soft, NT, no palpable masses. Skin: see extremities, no ulcers. Musculoskeletal: no muscle wasting or atrophy. Neurologic: A&O X 3; Appropriate Affect; MOTOR FUNCTION: moving all extremities equally, motor strength 5/5 throughout. Speech is fluent/normal. CN 2-12 intact.    ASSESSMENT: [name] is a 76 y.o. male who is s/p right femoropopliteal arterial bypass graft 08/17/2002 and left femoropopliteal arterial bypass graft 02/19/2002. He has no claudication symptoms with walking, no non healing wounds. His left saphenous vein was harvested in 2003 for the leg bypass graft.  Pt had a normal AAA duplex in 2013. His father had a AAA repaired. His abdominal aortic pulse is slightly palpable, bilateral femoral pulses are 4+ palpable. He has no claudication sx's with walking.  Will check bilateral aortoiliac duplex, to include proximal bilateral femoral arteries, in January 2019.   ABI's and TBI's remain normal bilaterally with mostly triphasic waveforms, right DP is biphasic.     DATA  Bilateral LE Arterial Duplex (07/13/17): Widely patent bilateral lower extremity bypass graft without evidence of restenosis or hyperplasia. No flow in the popliteal artery aneurysms bilaterally.   No significant change compared to prior examson 04/30/15 and 07/05/16.  ABI (Date: 07/13/2017):  R:   ABI: 1.12 (was 1.17 on 07-05-16),   PT: tri  DP: tri  TBI:  0.76  L:   ABI: 1.26 (was 1.18),   PT: tri  DP: tri  TBI: 0.94  Bilateral ABI and TBI remain normal with all triphasic waveforms.     PLAN:  Continue excellent walking and exercise habits. Based on the patient's vascular studies and examination, pt will return to clinic in 2 months for bilateral  aortoiliac duplex, to include proximal bilateral femoral arteries, 1 year with ABI's and bilateral LE arterial duplex.   I discussed in depth with the patient the nature of atherosclerosis, and emphasized the importance of maximal medical management including strict control of blood pressure, blood glucose, and lipid levels, obtaining regular exercise, and continued cessation of smoking.  The patient is aware that without maximal medical management the underlying atherosclerotic disease process will progress, limiting the benefit of any interventions.  The patient was given information about PAD including signs, symptoms, treatment, what symptoms should prompt the patient to seek immediate medical care, and risk reduction measures to take.  Clemon Chambers, RN, MSN, FNP-C [facility] [employer] Phone: [phone]  Clinic MD: Bridgett Larsson  07/13/17 2:33 PM

## 2017-07-23 ENCOUNTER — Other Ambulatory Visit: Payer: Self-pay | Admitting: Internal Medicine

## 2017-08-09 ENCOUNTER — Other Ambulatory Visit: Payer: Self-pay | Admitting: Internal Medicine

## 2017-09-20 ENCOUNTER — Ambulatory Visit: Payer: Medicare Other | Admitting: Internal Medicine

## 2017-09-20 VITALS — BP 114/68 | HR 67 | Ht 75.0 in | Wt 215.0 lb

## 2017-09-20 DIAGNOSIS — E785 Hyperlipidemia, unspecified: Secondary | ICD-10-CM

## 2017-09-20 DIAGNOSIS — I495 Sick sinus syndrome: Secondary | ICD-10-CM | POA: Diagnosis not present

## 2017-09-20 DIAGNOSIS — I447 Left bundle-branch block, unspecified: Secondary | ICD-10-CM | POA: Diagnosis not present

## 2017-09-20 DIAGNOSIS — I480 Paroxysmal atrial fibrillation: Secondary | ICD-10-CM

## 2017-09-20 DIAGNOSIS — Z950 Presence of cardiac pacemaker: Secondary | ICD-10-CM | POA: Diagnosis not present

## 2017-09-20 DIAGNOSIS — I428 Other cardiomyopathies: Secondary | ICD-10-CM

## 2017-09-20 NOTE — Progress Notes (Signed)
Patient Care Team: Biagio Borg, MD as PCP - General (Internal Medicine) Deboraha Sprang, MD (Cardiology) Melrose Nakayama, MD as Consulting Physician (Orthopedic Surgery) Biagio Borg, MD as Consulting Physician (Internal Medicine)   HPI  [name] is a 77 y.o. 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   His pacemaker reached ERI 8/13 and he underwent generator replacement with a Biotronik device because of his CLS function  He takes dofetilide for his atrial fibrillation    At his last visit in August he was noted to have new left bundle branch block. Assessment of LV function surprisingly demonstrated significant impairment in LV function 30-35% and he subsequently underwent catheterization demonstrating modest nonobstructive disease confirming LV dysfunction  >>Diffuse noncritical coronary disease involving each of the 3 major coronaries. Both the mid RCA and mid LAD contained up to 65% stenosis. The previously placed proximal LAD stent is widely patent. The small first diagonal contains 90% stenosis and is jailed by the stent.     DATE TEST    8/16 Echo EF 30-35%   3/17 Echo EF 35-40%   6 /17  Echo   EF 40-45 %   10/17 Echo  EF 40-45  %   4/18 Echo Ef 45%         Date Cr K Hgb  4/18 1.08 4.7 13.8     The patient denies chest pain, shortness of breath, nocturnal dyspnea, orthopnea or peripheral edema.  There have been no palpitations, lightheadedness or syncope.   He is actually feeling overall much better than he has   With more energy.         Past Medical History:  Diagnosis Date  . Allergic rhinitis    takes Allegra daily  . Allergic rhinitis, cause unspecified 06/22/2012  . Arthritis   . Atrial fibrillation ([place])    takes Tikosyn and Eliquis daily  . 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 Coronary artery disease      LAD stenting 2004 non-DES  . Depression    took Zoloft 33yrs ago but nothing now  . Diverticulosis   . DVT (deep venous thrombosis) ([place])   . Dysphagia    occasionally  . History of blood clots 2003   left leg prior to fem pop  . History of colon polyps   . Hyperlipidemia    takes Tricor and Lipitor daily  . Insomnia    takes Ambien nightly as needed  . Joint pain   . Joint swelling   . 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 OSA (obstructive sleep apnea)    CPAP  . Pacemaker   . Peripheral vascular disease ([place])   . Pleurisy    early 76's  . Urinary urgency     Past Surgical History:  Procedure Laterality Date  . CARDIAC CATHETERIZATION  2004  . CARDIAC CATHETERIZATION N/A 05/06/2015   Procedure: Left Heart Cath and Coronary Angiography;  Surgeon: Belva Crome, MD;  Location: [place] CV LAB;  Service: Cardiovascular;  Laterality: N/A;  . COLONOSCOPY    . hand and arm surgery Right    as a teenager  . HAND SURGERY Left   . INGUINAL HERNIA REPAIR Right   . JOINT REPLACEMENT Left December 09, 2013   Left Hip   . JOINT REPLACEMENT Right Aug.  25, 2015   Right Hip  . left knee surgery    . PACEMAKER INSERTION     due to bradycardia  . PERMANENT PACEMAKER GENERATOR CHANGE N/A 06/17/2012   Procedure: PERMANENT PACEMAKER GENERATOR CHANGE;  Surgeon: Deboraha Sprang, MD;  Location: [facility] CATH LAB;  Service: Cardiovascular;  Laterality: N/A;  . right and left fem-pop bypass    . stent (unspec)     x2  . TOTAL HIP ARTHROPLASTY Left 12/09/2013   Procedure: TOTAL HIP ARTHROPLASTY ANTERIOR APPROACH;  Surgeon: Hessie Dibble, MD;  Location: [place];  Service: Orthopedics;  Laterality: Left;  left anterior total hip arthroplasty  . TOTAL HIP ARTHROPLASTY Right 05/05/2014   Procedure: TOTAL HIP ARTHROPLASTY ANTERIOR APPROACH;  Surgeon: Hessie Dibble, MD;  Location: [place];  Service: Orthopedics;  Laterality: Right;  . TURBINATE REDUCTION    . wisdom extracted        Current Outpatient Medications  Medication Sig Dispense Refill  . atorvastatin (LIPITOR) 80 MG tablet TAKE 1 TABLET BY MOUTH  DAILY 90 tablet 1  . CALCIUM PO Take 600 mg by mouth 2 (two) times daily.    . carvedilol (COREG) 25 MG tablet TAKE 2 TABLETS BY MOUTH  TWICE A DAY 180 tablet 1  . Coenzyme Q10 (COQ-10 PO) Take 300 mg by mouth daily.    . Cyanocobalamin (VITAMIN B 12 PO) Take 1 tablet by mouth daily.    Marland Kitchen dofetilide (TIKOSYN) 500 MCG capsule Take 1 capsule (500 mcg total) by mouth 2 (two) times daily. 180 capsule 1  . ELIQUIS 5 MG TABS tablet TAKE 1 TABLET BY MOUTH TWO  TIMES DAILY 180 tablet 1  . fenofibrate 160 MG tablet TAKE 1 TABLET BY MOUTH  DAILY 90 tablet 1  . fexofenadine (ALLEGRA) 180 MG tablet Take 180 mg by mouth daily.    . Fluocinonide Emulsified Base 0.05 % CREA APPLY TO AFFECTED AREA(S)  TWO TIMES DAILY AS NEEDED 60 g 1  . FOLIC ACID PO Take 1 tablet by mouth daily.    Marland Kitchen GLUCOSAMINE CHONDROITIN COMPLX PO Take 1 tablet by mouth 2 (two) times daily.     Marland Kitchen losartan (COZAAR) 25 MG tablet Take 1 tablet (25 mg total) by mouth daily. 90 tablet 2  . Lysine 500 MG TABS Take 500 mg by mouth 3 (three) times daily.    . Magnesium 250 MG TABS Take 250 mg by mouth 2 (two) times daily.     . montelukast (SINGULAIR) 10 MG tablet Take 1 tablet (10 mg total) by mouth at bedtime. 90 tablet 2  . mupirocin nasal ointment (BACTROBAN NASAL) 2 % Place 1 application into the nose 2 (two) times daily. After application, press sides of nose together and gently massage. 10 g 0  . Omega-3 Fatty Acids (FISH OIL) 1200 MG CAPS Take 1,200 mg by mouth daily.     . potassium chloride (K-DUR,KLOR-CON) 10 MEQ tablet TAKE 1 TABLET BY MOUTH TWO  TIMES DAILY 180 tablet 1  . ranitidine (ZANTAC) 300 MG tablet Take 1 tablet (300 mg total) by mouth at bedtime. 90 tablet 2  . triamcinolone (NASACORT) 55 MCG/ACT AERO nasal inhaler Place 2 sprays into both nostrils daily.     Marland Kitchen zolpidem (AMBIEN) 10 MG tablet  Take 0.5 tablets (5 mg total) by mouth at bedtime as needed for sleep. 90 tablet 1   No current facility-administered medications for this visit.     Allergies  Allergen Reactions  . Amiodarone  REACTION: peripheral neuropathy  . Niacin     REACTION: irregular heartbeat  . Ace Inhibitors     REACTION: cough  . Mometasone Furoate Other (See Comments)    (Nasonex) Causes nasal bleeding and nose bleeds    Review of Systems negative except from HPI and PMH  Physical Exam BP 114/68   Pulse 67   Ht 6\' 3"  (1.905 m)   Wt 215 lb (97.5 kg)   BMI 26.87 kg/m  Well developed and nourished in no acute distress HENT normal Neck supple with JVP-flat Clear Regular rate and rhythm, no murmurs or gallops Abd-soft with active BS No Clubbing cyanosis edema Skin-warm and dry A & Oriented  Grossly normal sensory and motor function   ECG demonstrates atrial pacing at 67 Intervals 18/13/47 IVCD  Assessment and  Plan  Atrial fibrillation-paroxysmal  Symptomatic ventricular pacing  Pacemaker Biotronik The patient's device was interrogated and the information was fully reviewed.  The device was reprogrammed as above   IVCD// LBBB -question rate related  Nonischemic cardiomyopathy  Coronary artery disease A) prior stenting B) diffuse nonobstructive disease    Atrial fibrillation burden is about 8 percent.  This is up a little bit.  He is without symptoms.  We discussed theoretical modulators of atrial fibrillation recurrence risk i.e. might ranolazine be at adjunctively beneficial to dofetilide.  We will reassess LV function.  His left bundle branch block is less apparent and it looks more like a nonspecific IVCD.  We have talked about the potential role of spironolactone if his LV function is suppressed And the potential benefit of uptitrattion of losartan  No bleeding   No angina   Will check LDL  Last one 8/17 ws at goal  CCould his LBBB be aggravating his LV dysfunction    Without symptoms of ischemia   We spent more than 50% of our >25 min visit in face to face counseling regarding the above

## 2017-09-20 NOTE — Patient Instructions (Addendum)
Medication Instructions:  Your physician recommends that you continue on your current medications as directed. Please refer to the Current Medication list given to you today.  Labwork: You will need to come back to the office for a CBC, BMP, and a fasting lipid and magnesium level.  We will schedule this blood draw to coincide with your ECHO.  Testing/Procedures: Your physician has requested that you have an echocardiogram. Echocardiography is a painless test that uses sound waves to create images of your heart. It provides your doctor with information about the size and shape of your heart and how well your heart's chambers and valves are working. This procedure takes approximately one hour. There are no restrictions for this procedure.  We will call you to set up this ECHO.  Follow-Up: Your physician wants you to follow-up in: 6 months with Dr. Caryl Comes.   You will receive a reminder letter in the mail two months in advance. If you don't receive a letter, please call our office to schedule the follow-up appointment.  Any Other Special Instructions Will Be Listed Below (If Applicable).   If you need a refill on your cardiac medications before your next appointment, please call your pharmacy.

## 2017-09-21 ENCOUNTER — Encounter: Payer: Self-pay | Admitting: Internal Medicine

## 2017-09-27 ENCOUNTER — Other Ambulatory Visit: Payer: Self-pay | Admitting: Internal Medicine

## 2017-09-27 ENCOUNTER — Ambulatory Visit (HOSPITAL_COMMUNITY): Payer: Medicare Other | Attending: Cardiovascular Disease

## 2017-09-27 ENCOUNTER — Other Ambulatory Visit: Payer: Medicare Other | Admitting: *Deleted

## 2017-09-27 ENCOUNTER — Other Ambulatory Visit: Payer: Self-pay

## 2017-09-27 DIAGNOSIS — I428 Other cardiomyopathies: Secondary | ICD-10-CM | POA: Diagnosis not present

## 2017-09-27 DIAGNOSIS — Z950 Presence of cardiac pacemaker: Secondary | ICD-10-CM

## 2017-09-27 DIAGNOSIS — E785 Hyperlipidemia, unspecified: Secondary | ICD-10-CM | POA: Diagnosis not present

## 2017-09-27 DIAGNOSIS — I495 Sick sinus syndrome: Secondary | ICD-10-CM

## 2017-09-27 DIAGNOSIS — I350 Nonrheumatic aortic (valve) stenosis: Secondary | ICD-10-CM | POA: Diagnosis not present

## 2017-09-27 DIAGNOSIS — I447 Left bundle-branch block, unspecified: Secondary | ICD-10-CM | POA: Diagnosis not present

## 2017-09-27 DIAGNOSIS — I480 Paroxysmal atrial fibrillation: Secondary | ICD-10-CM | POA: Diagnosis not present

## 2017-09-28 ENCOUNTER — Ambulatory Visit (INDEPENDENT_AMBULATORY_CARE_PROVIDER_SITE_OTHER): Payer: Medicare Other | Admitting: Family

## 2017-09-28 ENCOUNTER — Encounter: Payer: Self-pay | Admitting: Family

## 2017-09-28 ENCOUNTER — Ambulatory Visit (HOSPITAL_COMMUNITY)
Admission: RE | Admit: 2017-09-28 | Discharge: 2017-09-28 | Disposition: A | Discharge status: Home / Self Care | Payer: Medicare Other | Source: Ambulatory Visit | Attending: Family | Admitting: Family

## 2017-09-28 ENCOUNTER — Other Ambulatory Visit: Payer: Self-pay

## 2017-09-28 VITALS — BP 111/59 | HR 84 | Temp 96.9°F | Resp 18 | Ht 75.0 in | Wt 211.0 lb

## 2017-09-28 DIAGNOSIS — E785 Hyperlipidemia, unspecified: Secondary | ICD-10-CM | POA: Insufficient documentation

## 2017-09-28 DIAGNOSIS — I724 Aneurysm of artery of lower extremity: Secondary | ICD-10-CM

## 2017-09-28 DIAGNOSIS — Z95828 Presence of other vascular implants and grafts: Secondary | ICD-10-CM | POA: Diagnosis not present

## 2017-09-28 DIAGNOSIS — R0989 Other specified symptoms and signs involving the circulatory and respiratory systems: Secondary | ICD-10-CM | POA: Insufficient documentation

## 2017-09-28 DIAGNOSIS — Z8249 Family history of ischemic heart disease and other diseases of the circulatory system: Secondary | ICD-10-CM

## 2017-09-28 LAB — CBC WITH DIFFERENTIAL/PLATELET
BASOS: 1 %
Basophils Absolute: 0 10*3/uL (ref 0.0–0.2)
EOS (ABSOLUTE): 0.3 10*3/uL (ref 0.0–0.4)
EOS: 6 %
Hematocrit: 42.5 % (ref 37.5–51.0)
Hemoglobin: 14.3 g/dL (ref 13.0–17.7)
IMMATURE GRANS (ABS): 0 10*3/uL (ref 0.0–0.1)
IMMATURE GRANULOCYTES: 0 %
LYMPHS: 35 %
Lymphocytes Absolute: 1.5 10*3/uL (ref 0.7–3.1)
MCH: 32.6 pg (ref 26.6–33.0)
MCHC: 33.6 g/dL (ref 31.5–35.7)
MCV: 97 fL (ref 79–97)
Monocytes Absolute: 0.6 10*3/uL (ref 0.1–0.9)
Monocytes: 15 %
NEUTROS PCT: 43 %
Neutrophils Absolute: 1.9 10*3/uL (ref 1.4–7.0)
PLATELETS: 141 10*3/uL — AB (ref 150–379)
RBC: 4.38 x10E6/uL (ref 4.14–5.80)
RDW: 13.5 % (ref 12.3–15.4)
WBC: 4.3 10*3/uL (ref 3.4–10.8)

## 2017-09-28 LAB — LIPID PANEL
CHOLESTEROL TOTAL: 104 mg/dL (ref 100–199)
Chol/HDL Ratio: 2.5 ratio (ref 0.0–5.0)
HDL: 41 mg/dL (ref >39)
LDL Calculated: 48 mg/dL (ref 0–99)
TRIGLYCERIDES: 76 mg/dL (ref 0–149)
VLDL CHOLESTEROL CAL: 15 mg/dL (ref 5–40)

## 2017-09-28 LAB — BASIC METABOLIC PANEL
BUN/Creatinine Ratio: 16 (ref 10–24)
BUN: 18 mg/dL (ref 8–27)
CALCIUM: 10.1 mg/dL (ref 8.6–10.2)
CO2: 24 mmol/L (ref 20–29)
Chloride: 105 mmol/L (ref 96–106)
Creatinine, Ser: 1.13 mg/dL (ref 0.76–1.27)
GFR, EST AFRICAN AMERICAN: 72 mL/min/{1.73_m2} (ref >59)
GFR, EST NON AFRICAN AMERICAN: 62 mL/min/{1.73_m2} (ref >59)
Glucose: 91 mg/dL (ref 65–99)
Potassium: 4.5 mmol/L (ref 3.5–5.2)
Sodium: 144 mmol/L (ref 134–144)

## 2017-09-28 LAB — MAGNESIUM: Magnesium: 2 mg/dL (ref 1.6–2.3)

## 2017-09-28 NOTE — Progress Notes (Signed)
[facility] [place]   CC: Follow up peripheral artery occlusive disease  History of Present Illness [name] is a 77 y.o. male patient of Dr. Amedeo Plenty, Dr. Kellie Simmering, then Dr. 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 He had embolic shower to his left foot from the left popliteal aneurysm. He has mild tingling in his left toes, but no other residual symptoms. He returns today for routine follow up. He denies claudication symptoms in his legs with walking, denies non healing wounds. He saw a dermatologist for his light lower leg rash, who treated him with a topical corticosteroid which resolved the rash.  He had both hips replaced at [facility] in 2015.  He has a pacemaker, history of atrial fib for which he also takes Eliquis. His father had a dissecting AAA, he had a sleeve surgical repair, he was a heavy smoker, did not have DM, was obese, per pt. Pt had a normal AAA Duplex in 2013. Pt denies back or abdominal pain. Pt denies any history of stroke or TIA.  He states that he may have a picnched nerve in his c-spine, but has no sx's lately. He states his EF is 30%.  Pt Diabetic: No Pt smoker: non-smoker  Pt meds include: Statin :Yes ASA: no Other anticoagulants/antiplatelets: Eliquis for atrial fib. He has not had a DVT (is documented in his hx)   Past Medical History:  Diagnosis Date  . Allergic rhinitis    takes Allegra daily  . Allergic rhinitis, cause unspecified 06/22/2012  . Arthritis   . Atrial fibrillation ([place])    takes Tikosyn and Eliquis daily  . 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 Coronary artery disease    LAD stenting 2004 non-DES  . Depression    took Zoloft 76yrs ago but nothing now  . Diverticulosis   . DVT (deep venous thrombosis) ([place])   . Dysphagia    occasionally  . History of blood clots 2003   left leg prior to fem pop  . History of colon polyps   . Hyperlipidemia    takes Tricor and Lipitor daily  . Insomnia    takes Ambien nightly as needed  . Joint pain   . Joint swelling   . 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 OSA (obstructive sleep apnea)    CPAP  . Pacemaker   . Peripheral vascular disease ([place])   . Pleurisy    early 55's  . Urinary urgency     Social History Social History   Tobacco Use  . Smoking status: Never Smoker  . Smokeless tobacco: Never Used  Substance Use Topics  . Alcohol use: No    Alcohol/week: 0.0 oz    Comment: nothing since 2000  . Drug use: No    Family History Family History  Problem Relation Age of Onset  . Diabetes Other        family hx  . Sleep apnea Other        family hx  . Diabetes Mother   . Heart disease Mother        Before age 87  . Heart disease Father        Before age 10  . Heart attack Father   . Stroke Father   . Aneurysm Father        bilat pop art aneurysms  .  Peripheral vascular disease Father        Popliteal Aneurysm  . Allergic rhinitis Neg Hx   . Angioedema Neg Hx   . Asthma Neg Hx   . Eczema Neg Hx   . Immunodeficiency Neg Hx   . Urticaria Neg Hx     Past Surgical History:  Procedure Laterality Date  . CARDIAC CATHETERIZATION  2004  . CARDIAC CATHETERIZATION N/A 05/06/2015   Procedure: Left Heart Cath and Coronary Angiography;  Surgeon: Belva Crome, MD;  Location: [facility];  Service: Cardiovascular;  Laterality: N/A;  . COLONOSCOPY    . hand and arm surgery Right    as a teenager  . HAND SURGERY Left   . INGUINAL HERNIA REPAIR Right   . JOINT REPLACEMENT Left December 09, 2013   Left Hip   . JOINT REPLACEMENT Right Aug. 25, 2015   Right Hip  . left knee surgery    . PACEMAKER INSERTION     due to bradycardia  . PERMANENT PACEMAKER GENERATOR CHANGE N/A 06/17/2012   Procedure: PERMANENT PACEMAKER GENERATOR CHANGE;  Surgeon: Deboraha Sprang, MD;  Location: [facility]  CATH LAB;  Service: Cardiovascular;  Laterality: N/A;  . right and left fem-pop bypass    . stent (unspec)     x2  . TOTAL HIP ARTHROPLASTY Left 12/09/2013   Procedure: TOTAL HIP ARTHROPLASTY ANTERIOR APPROACH;  Surgeon: Hessie Dibble, MD;  Location: [place];  Service: Orthopedics;  Laterality: Left;  left anterior total hip arthroplasty  . TOTAL HIP ARTHROPLASTY Right 05/05/2014   Procedure: TOTAL HIP ARTHROPLASTY ANTERIOR APPROACH;  Surgeon: Hessie Dibble, MD;  Location: [place];  Service: Orthopedics;  Laterality: Right;  . TURBINATE REDUCTION    . wisdom extracted       Allergies  Allergen Reactions  . Amiodarone     REACTION: peripheral neuropathy  . Niacin     REACTION: irregular heartbeat  . Ace Inhibitors     REACTION: cough  . Mometasone Furoate Other (See Comments)    (Nasonex) Causes nasal bleeding and nose bleeds    Current Outpatient Medications  Medication Sig Dispense Refill  . atorvastatin (LIPITOR) 80 MG tablet TAKE 1 TABLET BY MOUTH  DAILY 90 tablet 1  . CALCIUM PO Take 600 mg by mouth 2 (two) times daily.    . carvedilol (COREG) 25 MG tablet TAKE 2 TABLETS BY MOUTH  TWICE A DAY 360 tablet 2  . Coenzyme Q10 (COQ-10 PO) Take 300 mg by mouth daily.    . Cyanocobalamin (VITAMIN B 12 PO) Take 1 tablet by mouth daily.    Marland Kitchen dofetilide (TIKOSYN) 500 MCG capsule Take 1 capsule (500 mcg total) by mouth 2 (two) times daily. 180 capsule 1  . ELIQUIS 5 MG TABS tablet TAKE 1 TABLET BY MOUTH TWO  TIMES DAILY 180 tablet 1  . fenofibrate 160 MG tablet TAKE 1 TABLET BY MOUTH  DAILY 90 tablet 1  . fexofenadine (ALLEGRA) 180 MG tablet Take 180 mg by mouth daily.    . Fluocinonide Emulsified Base 0.05 % CREA APPLY TO AFFECTED AREA(S)  TWO TIMES DAILY AS NEEDED 60 g 1  . FOLIC ACID PO Take 1 tablet by mouth daily.    Marland Kitchen GLUCOSAMINE CHONDROITIN COMPLX PO Take 1 tablet by mouth 2 (two) times daily.     Marland Kitchen losartan (COZAAR) 25 MG tablet Take 1 tablet (25 mg total) by mouth daily. 90  tablet 2  . Lysine 500 MG TABS  Take 500 mg by mouth 3 (three) times daily.    . Magnesium 250 MG TABS Take 250 mg by mouth 2 (two) times daily.     . montelukast (SINGULAIR) 10 MG tablet Take 1 tablet (10 mg total) by mouth at bedtime. 90 tablet 2  . mupirocin nasal ointment (BACTROBAN NASAL) 2 % Place 1 application into the nose 2 (two) times daily. After application, press sides of nose together and gently massage. 10 g 0  . Omega-3 Fatty Acids (FISH OIL) 1200 MG CAPS Take 1,200 mg by mouth daily.     . potassium chloride (K-DUR,KLOR-CON) 10 MEQ tablet TAKE 1 TABLET BY MOUTH TWO  TIMES DAILY 180 tablet 1  . ranitidine (ZANTAC) 300 MG tablet Take 1 tablet (300 mg total) by mouth at bedtime. 90 tablet 2  . triamcinolone (NASACORT) 55 MCG/ACT AERO nasal inhaler Place 2 sprays into both nostrils daily.     Marland Kitchen zolpidem (AMBIEN) 10 MG tablet Take 0.5 tablets (5 mg total) by mouth at bedtime as needed for sleep. 90 tablet 1   No current facility-administered medications for this visit.     ROS: See HPI for pertinent positives and negatives.   Physical Examination  Vitals:   09/28/17 0915  BP: (!) 111/59  Pulse: 84  Resp: 18  Temp: (!) 96.9 F (36.1 C)  TempSrc: Oral  SpO2: 96%  Weight: 211 lb (95.7 kg)  Height: 6\' 3"  (1.905 m)   Body mass index is 26.37 kg/m.  General: A&O x 3, WDWN. Tall, fit appearing male.  Gait: normal HENT: No gross abnormalities.  Eyes: PERRLA. Pulmonary: Respirations are non labored, CTAB, no wheezes, rales, or rhonchi. Cardiac: Irregular rhythm, regular rate, nodetected murmur, pacemaker palpated left upper chest subcutaneously.     Carotid Bruits Right Left   Negative Negative   Abdominal aortic pulse is slightly palpable. Radial pulses: are 2+ palpable and =   VASCULAR EXAM: Extremitieswithout ischemic changes  without Gangrene; without open wounds. Multiple small varicosities at ankles. No peripheral  edema.     LE Pulses Right Left   FEMORAL  3+palpable  3+palpable    POPLITEAL 2+ palpable  2+ palpable   POSTERIOR TIBIAL 3+ palpable  3+ palpable    DORSALIS PEDIS  ANTERIOR TIBIAL Not  palpable  not palpable    Abdomen: soft, NT, no palpable masses. Skin: see extremities, no ulcers. Musculoskeletal: no muscle wasting or atrophy.       Neurologic: A&O X 3; Appropriate Affect; MOTOR FUNCTION: moving all extremities equally, motor strength 5/5 throughout. Speech is                         fluent/normal.CN 2-12 intact        Psychiatric: Normal thought content, mood appropriate to clinical situation.     ASSESSMENT: GAJE TENNYSON is a 77 y.o. male who is s/p right femoropopliteal arterial bypass graft 08/17/2002 and left femoropopliteal arterial bypass graft 02/19/2002. He has no claudication symptoms with walking, no non healing wounds. His left saphenous vein was harvested in 2003 for the leg bypass graft.  Pt had a normal AAA duplex in 2013. His father had a AAA repaired. His abdominal aortic pulse is slightly palpable, bilateral femoral pulses are 3+ palpable; no abdominal aortic aneurysm, no iliac artery aneurysms on abdominal duplex today.  He has no claudication sx's with walking.   ABI's and TBI's remain normal bilaterally with mostly triphasic waveforms, right DP is  biphasic.     DATA  Bilateral Aortoiliac Duplex (09/28/17): No aneurysm of the abdominal aorta, no aneurysm of the bilateral iliac arteries. No change from 2013.   Bilateral LE Arterial Duplex (07/13/17): Widely patent bilateral lower extremity bypass graft without evidence of restenosis or hyperplasia. No flow in the popliteal artery aneurysms bilaterally.   No significant change compared to prior  examson 04/30/15 and 07/05/16.  ABI (Date: 07/13/2017):  R:  ? ABI: 1.12 (was 1.17 on 07-05-16),  ? PT: tri ? DP: tri ? TBI:  0.76  L:  ? ABI: 1.26 (was 1.18),  ? PT: tri ? DP: tri ? TBI: 0.94 ? Bilateral ABI and TBI remain normal with all triphasic waveforms.     PLAN:  Continue excellent walking and exercise habits. Based on the patient's vascular studies and examination, pt will return to clinic in 1 year with ABI's and bilateral LE arterial duplex. Will consider rechecking for AAA and bilateral iliac artery aneurysms in about 5 years due to his family hx.   I discussed in depth with the patient the nature of atherosclerosis, and emphasized the importance of maximal medical management including strict control of blood pressure, blood glucose, and lipid levels, obtaining regular exercise, and continued cessation of smoking.  The patient is aware that without maximal medical management the underlying atherosclerotic disease process will progress, limiting the benefit of any interventions.  The patient was given information about PAD including signs, symptoms, treatment, what symptoms should prompt the patient to seek immediate medical care, and risk reduction measures to take.  Clemon Chambers, RN, MSN, FNP-C [facility] Phone: [phone]  Clinic MD: Bridgett Larsson  09/28/17 9:32 AM

## 2017-10-02 ENCOUNTER — Encounter: Payer: Self-pay | Admitting: Internal Medicine

## 2017-10-17 LAB — CUP PACEART INCLINIC DEVICE CHECK
Brady Statistic RV Percent Paced: 3 %
Date Time Interrogation Session: 20190110213200
Implantable Lead Implant Date: 19980115
Implantable Lead Implant Date: 19980115
Implantable Lead Location: 753859
Implantable Lead Location: 753860
Lead Channel Impedance Value: 351 Ohm
Lead Channel Impedance Value: 370 Ohm
Lead Channel Pacing Threshold Amplitude: 0.8 V
Lead Channel Pacing Threshold Pulse Width: 0.4 ms
Lead Channel Sensing Intrinsic Amplitude: 2.5 mV
MDC IDC MSMT LEADCHNL RV PACING THRESHOLD AMPLITUDE: 0.8 V
MDC IDC MSMT LEADCHNL RV PACING THRESHOLD PULSEWIDTH: 0.4 ms
MDC IDC MSMT LEADCHNL RV SENSING INTR AMPL: 6 mV
MDC IDC PG IMPLANT DT: 20131007
MDC IDC SET LEADCHNL RA PACING AMPLITUDE: 2 V
MDC IDC SET LEADCHNL RV PACING AMPLITUDE: 2.5 V
MDC IDC SET LEADCHNL RV PACING PULSEWIDTH: 0.4 ms
MDC IDC STAT BRADY RA PERCENT PACED: 72 %
Pulse Gen Serial Number: 66339555

## 2017-11-18 ENCOUNTER — Other Ambulatory Visit: Payer: Self-pay | Admitting: Allergy and Immunology

## 2017-11-18 ENCOUNTER — Other Ambulatory Visit: Payer: Self-pay | Admitting: Internal Medicine

## 2017-11-28 ENCOUNTER — Other Ambulatory Visit: Payer: Self-pay | Admitting: Internal Medicine

## 2017-12-16 ENCOUNTER — Other Ambulatory Visit: Payer: Self-pay | Admitting: Allergy and Immunology

## 2018-01-02 ENCOUNTER — Other Ambulatory Visit: Payer: Self-pay | Admitting: Internal Medicine

## 2018-01-04 ENCOUNTER — Other Ambulatory Visit: Payer: Self-pay

## 2018-01-17 ENCOUNTER — Other Ambulatory Visit: Payer: Self-pay | Admitting: Internal Medicine

## 2018-02-01 DIAGNOSIS — L821 Other seborrheic keratosis: Secondary | ICD-10-CM | POA: Diagnosis not present

## 2018-02-01 DIAGNOSIS — L812 Freckles: Secondary | ICD-10-CM | POA: Diagnosis not present

## 2018-03-15 ENCOUNTER — Telehealth: Payer: Self-pay | Admitting: Internal Medicine

## 2018-03-15 DIAGNOSIS — I447 Left bundle-branch block, unspecified: Secondary | ICD-10-CM

## 2018-03-15 DIAGNOSIS — I495 Sick sinus syndrome: Secondary | ICD-10-CM

## 2018-03-15 NOTE — Telephone Encounter (Signed)
Advised pt I did not see a note stating he needed an Echo before his next OV with Dr Caryl Comes. From his past hx, it looks like he gets yearly Echo's. I have scheduled his follow up in Sept for him. I will forward this to Dr Caryl Comes for further advisement on needing an echo before his follow up appointment.

## 2018-03-15 NOTE — Telephone Encounter (Signed)
Pt calling  Wanting to speak to nurse concerning scheduling Echocardiogram. Please call and advise pt.

## 2018-03-18 NOTE — Telephone Encounter (Signed)
Yes  plz

## 2018-03-19 NOTE — Telephone Encounter (Signed)
Pt agreeable to echo before his next OV. He will also have his bi-yearly labs drawn at this time as well.

## 2018-04-26 NOTE — Telephone Encounter (Signed)
Called pt and he states his rx should be delivered by this afternoon. If not, he will call back later today and I will send in a rx to get him through the weekend. Pt agrees to plan and will let me know.

## 2018-05-14 ENCOUNTER — Other Ambulatory Visit: Payer: Self-pay

## 2018-05-14 ENCOUNTER — Ambulatory Visit (HOSPITAL_COMMUNITY): Payer: Medicare Other | Attending: Cardiovascular Disease

## 2018-05-14 ENCOUNTER — Other Ambulatory Visit: Payer: Medicare Other | Admitting: *Deleted

## 2018-05-14 DIAGNOSIS — I447 Left bundle-branch block, unspecified: Secondary | ICD-10-CM | POA: Diagnosis not present

## 2018-05-14 DIAGNOSIS — I361 Nonrheumatic tricuspid (valve) insufficiency: Secondary | ICD-10-CM | POA: Insufficient documentation

## 2018-05-14 DIAGNOSIS — I4891 Unspecified atrial fibrillation: Secondary | ICD-10-CM | POA: Insufficient documentation

## 2018-05-14 DIAGNOSIS — I495 Sick sinus syndrome: Secondary | ICD-10-CM

## 2018-05-14 DIAGNOSIS — I509 Heart failure, unspecified: Secondary | ICD-10-CM | POA: Insufficient documentation

## 2018-05-14 DIAGNOSIS — E785 Hyperlipidemia, unspecified: Secondary | ICD-10-CM | POA: Diagnosis not present

## 2018-05-14 DIAGNOSIS — G4733 Obstructive sleep apnea (adult) (pediatric): Secondary | ICD-10-CM | POA: Diagnosis not present

## 2018-05-14 DIAGNOSIS — I739 Peripheral vascular disease, unspecified: Secondary | ICD-10-CM | POA: Insufficient documentation

## 2018-05-15 LAB — CBC WITH DIFFERENTIAL/PLATELET
BASOS: 1 %
Basophils Absolute: 0 10*3/uL (ref 0.0–0.2)
EOS (ABSOLUTE): 0.3 10*3/uL (ref 0.0–0.4)
EOS: 6 %
HEMATOCRIT: 38.8 % (ref 37.5–51.0)
Hemoglobin: 12.7 g/dL — ABNORMAL LOW (ref 13.0–17.7)
IMMATURE GRANULOCYTES: 0 %
Immature Grans (Abs): 0 10*3/uL (ref 0.0–0.1)
LYMPHS ABS: 1.4 10*3/uL (ref 0.7–3.1)
Lymphs: 29 %
MCH: 32.4 pg (ref 26.6–33.0)
MCHC: 32.7 g/dL (ref 31.5–35.7)
MCV: 99 fL — AB (ref 79–97)
MONOS ABS: 0.4 10*3/uL (ref 0.1–0.9)
Monocytes: 9 %
NEUTROS ABS: 2.6 10*3/uL (ref 1.4–7.0)
Neutrophils: 55 %
Platelets: 155 10*3/uL (ref 150–450)
RBC: 3.92 x10E6/uL — ABNORMAL LOW (ref 4.14–5.80)
RDW: 13.7 % (ref 12.3–15.4)
WBC: 4.6 10*3/uL (ref 3.4–10.8)

## 2018-05-15 LAB — BASIC METABOLIC PANEL
BUN / CREAT RATIO: 19 (ref 10–24)
BUN: 22 mg/dL (ref 8–27)
CO2: 22 mmol/L (ref 20–29)
CREATININE: 1.15 mg/dL (ref 0.76–1.27)
Calcium: 9.6 mg/dL (ref 8.6–10.2)
Chloride: 106 mmol/L (ref 96–106)
GFR, EST AFRICAN AMERICAN: 70 mL/min/{1.73_m2} (ref >59)
GFR, EST NON AFRICAN AMERICAN: 61 mL/min/{1.73_m2} (ref >59)
Glucose: 138 mg/dL — ABNORMAL HIGH (ref 65–99)
POTASSIUM: 4.7 mmol/L (ref 3.5–5.2)
SODIUM: 145 mmol/L — AB (ref 134–144)

## 2018-05-15 LAB — MAGNESIUM: Magnesium: 1.8 mg/dL (ref 1.6–2.3)

## 2018-05-27 ENCOUNTER — Encounter

## 2018-05-27 ENCOUNTER — Encounter: Payer: Medicare Other | Admitting: Internal Medicine

## 2018-05-28 DIAGNOSIS — R040 Epistaxis: Secondary | ICD-10-CM | POA: Diagnosis not present

## 2018-05-28 DIAGNOSIS — J342 Deviated nasal septum: Secondary | ICD-10-CM | POA: Diagnosis not present

## 2018-05-29 ENCOUNTER — Ambulatory Visit: Payer: Medicare Other | Admitting: Internal Medicine

## 2018-05-29 ENCOUNTER — Encounter: Payer: Self-pay | Admitting: Internal Medicine

## 2018-05-29 VITALS — BP 110/62 | HR 64 | Ht 75.0 in | Wt 214.8 lb

## 2018-05-29 DIAGNOSIS — I495 Sick sinus syndrome: Secondary | ICD-10-CM | POA: Diagnosis not present

## 2018-05-29 DIAGNOSIS — I447 Left bundle-branch block, unspecified: Secondary | ICD-10-CM | POA: Diagnosis not present

## 2018-05-29 DIAGNOSIS — I428 Other cardiomyopathies: Secondary | ICD-10-CM

## 2018-05-29 DIAGNOSIS — Z950 Presence of cardiac pacemaker: Secondary | ICD-10-CM | POA: Diagnosis not present

## 2018-05-29 DIAGNOSIS — I480 Paroxysmal atrial fibrillation: Secondary | ICD-10-CM

## 2018-05-29 NOTE — Progress Notes (Signed)
Patient Care Team: Biagio Borg, MD as PCP - General (Internal Medicine) Deboraha Sprang, MD (Cardiology) Melrose Nakayama, MD as Consulting Physician (Orthopedic Surgery) Biagio Borg, MD as Consulting Physician (Internal Medicine)   HPI  [name] is a 77 y.o. 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   His pacemaker reached ERI 8/13 and he underwent generator replacement with a Biotronik device because of his CLS function  He takes dofetilide for his atrial fibrillation    At his last visit in August he was noted to have new left bundle branch block. Assessment of LV function surprisingly demonstrated significant impairment in LV function 30-35% and he subsequently underwent catheterization demonstrating modest nonobstructive disease confirming LV dysfunction  >>Diffuse noncritical coronary disease involving each of the 3 major coronaries. Both the mid RCA and mid LAD contained up to 65% stenosis. The previously placed proximal LAD stent is widely patent. The small first diagonal contains 90% stenosis and is jailed by the stent.     DATE TEST EF   8/16 Echo  30-35%   3/17 Echo 35-40%   6 /17  Echo   40-45 %   10/17 Echo  40-45  %   4/18 Echo   45%   9/19 Echo  50-55%    Date Cr K Hgb  4/18 1.08 4.7 13.8  9/19 1.15 4.7 12.7     The patient denies chest pain, shortness of breath, nocturnal dyspnea, orthopnea or peripheral edema.  There have been no palpitations, lightheadedness or syncope.   He brings in a report tracking his ejection fractions and his carvedilol.  He thinks that the improved ejection fraction relates to his carvedilol dose being gradually uptitrated to 50 mg twice daily in the recent introduction of vitamin D.  He also brings a paper from [place] called the VINDICATE TRIAL demonstrating improvement in a small study of ejection  fraction with vitamin D  He has some asymmetric swelling left greater than right.  It was on the side that he had a ruptured popliteal aneurysm.  Some more recent palpitations.  Consistent with his PVCs.  Past Medical History:  Diagnosis Date  . Allergic rhinitis    takes Allegra daily  . Allergic rhinitis, cause unspecified 06/22/2012  . Arthritis   . Atrial fibrillation ([place])    takes Tikosyn and Eliquis daily  . 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 Coronary artery disease    LAD stenting 2004 non-DES  . Depression    took Zoloft 84yrs ago but nothing now  . Diverticulosis   . DVT (deep venous thrombosis) ([place])   . Dysphagia    occasionally  . History of blood clots 2003   left leg prior to fem pop  . History of colon polyps   . Hyperlipidemia    takes Tricor and Lipitor daily  . Insomnia    takes Ambien nightly as needed  . Joint pain   . Joint swelling   . 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 OSA (obstructive sleep apnea)    CPAP  . Pacemaker   . Peripheral vascular disease ([place])   . Pleurisy    early 59's  . Urinary urgency     Past Surgical History:  Procedure Laterality Date  . CARDIAC CATHETERIZATION  2004  . CARDIAC CATHETERIZATION N/A  05/06/2015   Procedure: Left Heart Cath and Coronary Angiography;  Surgeon: Belva Crome, MD;  Location: [facility];  Service: Cardiovascular;  Laterality: N/A;  . COLONOSCOPY    . hand and arm surgery Right    as a teenager  . HAND SURGERY Left   . INGUINAL HERNIA REPAIR Right   . JOINT REPLACEMENT Left December 09, 2013   Left Hip   . JOINT REPLACEMENT Right Aug. 25, 2015   Right Hip  . left knee surgery    . PACEMAKER INSERTION     due to bradycardia  . PERMANENT PACEMAKER GENERATOR CHANGE N/A 06/17/2012   Procedure: PERMANENT PACEMAKER GENERATOR CHANGE;  Surgeon: Deboraha Sprang, MD;  Location: [facility] CATH LAB;  Service: Cardiovascular;  Laterality: N/A;  . right and left fem-pop  bypass    . stent (unspec)     x2  . TOTAL HIP ARTHROPLASTY Left 12/09/2013   Procedure: TOTAL HIP ARTHROPLASTY ANTERIOR APPROACH;  Surgeon: Hessie Dibble, MD;  Location: [facility];  Service: Orthopedics;  Laterality: Left;  left anterior total hip arthroplasty  . TOTAL HIP ARTHROPLASTY Right 05/05/2014   Procedure: TOTAL HIP ARTHROPLASTY ANTERIOR APPROACH;  Surgeon: Hessie Dibble, MD;  Location: [facility];  Service: Orthopedics;  Laterality: Right;  . TURBINATE REDUCTION    . wisdom extracted       Current Outpatient Medications  Medication Sig Dispense Refill  . Amino Acids (L-CARNITINE PO) Take 3 g by mouth daily.    Marland Kitchen atorvastatin (LIPITOR) 80 MG tablet TAKE 1 TABLET BY MOUTH  DAILY 90 tablet 2  . CALCIUM PO Take 600 mg by mouth 2 (two) times daily.    . carvedilol (COREG) 25 MG tablet TAKE 2 TABLETS BY MOUTH  TWICE A DAY 360 tablet 2  . Cholecalciferol (D3 VITAMIN PO) Take 4,000 Int'l Units by mouth daily.    . Coenzyme Q10 (COQ-10 PO) Take 300 mg by mouth daily.    . Cyanocobalamin (VITAMIN B 12 PO) Take 1 tablet by mouth daily.    Marland Kitchen dofetilide (TIKOSYN) 500 MCG capsule TAKE 1 CAPSULE BY MOUTH TWO TIMES DAILY 180 capsule 2  . ELIQUIS 5 MG TABS tablet TAKE 1 TABLET BY MOUTH TWO  TIMES DAILY 180 tablet 1  . fenofibrate 160 MG tablet TAKE 1 TABLET BY MOUTH  DAILY 90 tablet 2  . fexofenadine (ALLEGRA) 180 MG tablet Take 180 mg by mouth daily.    . Fluocinonide Emulsified Base 0.05 % CREA APPLY TO AFFECTED AREA(S)  TWO TIMES DAILY AS NEEDED 60 g 1  . FOLIC ACID PO Take 1 tablet by mouth daily.    Marland Kitchen GLUCOSAMINE CHONDROITIN COMPLX PO Take 1 tablet by mouth 2 (two) times daily.     Marland Kitchen losartan (COZAAR) 25 MG tablet TAKE 1 TABLET BY MOUTH  DAILY 90 tablet 2  . Lysine 500 MG TABS Take 500 mg by mouth 3 (three) times daily.    . Magnesium 250 MG TABS Take 250 mg by mouth 2 (two) times daily.     . montelukast (SINGULAIR) 10 MG tablet Take 1 tablet (10 mg total) by mouth at bedtime. 90 tablet 2    . mupirocin nasal ointment (BACTROBAN NASAL) 2 % Place 1 application into the nose 2 (two) times daily. After application, press sides of nose together and gently massage. 10 g 0  . Omega-3 Fatty Acids (FISH OIL) 1200 MG CAPS Take 1,200 mg by mouth daily.     Marland Kitchen  potassium chloride (K-DUR,KLOR-CON) 10 MEQ tablet TAKE 1 TABLET BY MOUTH TWO  TIMES DAILY 180 tablet 2  . ranitidine (ZANTAC) 300 MG tablet Take 1 tablet (300 mg total) by mouth at bedtime. 90 tablet 2  . triamcinolone (NASACORT) 55 MCG/ACT AERO nasal inhaler Place 2 sprays into both nostrils daily.     Marland Kitchen zolpidem (AMBIEN) 10 MG tablet Take 0.5 tablets (5 mg total) by mouth at bedtime as needed for sleep. 90 tablet 1   No current facility-administered medications for this visit.     Allergies  Allergen Reactions  . Amiodarone     REACTION: peripheral neuropathy  . Niacin     REACTION: irregular heartbeat  . Ace Inhibitors     REACTION: cough  . Mometasone Furoate Other (See Comments)    (Nasonex) Causes nasal bleeding and nose bleeds    Review of Systems negative except from HPI and PMH  Physical Exam BP 110/62   Pulse 64   Ht 6\' 3"  (1.905 m)   Wt 214 lb 12.8 oz (97.4 kg)   SpO2 97%   BMI 26.85 kg/m  Well developed and nourished in no acute distress HENT normal Neck supple with JVP-flat Clear Regular rate and rhythm, no murmurs or gallops Abd-soft with active BS No Clubbing cyanosis edema Skin-warm and dry A & Oriented  Grossly normal sensory and motor function    ECG demonstrates a pacing 22/13/48  Assessment and  Plan  Atrial fibrillation-paroxysmal  Symptomatic ventricular pacing  Pacemaker Biotronik The patient's device was interrogated.  The information was reviewed. No changes were made in the programming.      IVCD// LBBB -question rate related  Nonischemic cardiomyopathy improved   Coronary artery disease A) prior stenting B) diffuse nonobstructive disease  Atrial fib burden is about 12 %  in the range of what has been seen  EF is better; we will continue current medications.   He thinks is related to the carvedilo as well as the vitamin D   He thinks he is having more PVCs.  He has started himself on a carnitine trial; looking on [employer] we found a paper reporting decreased the incidence of ventricular ectopic activity.  We will see what happens.   Without symptoms of ischemia  We spent more than 50% of our >25 min visit in face to face counseling regarding the above

## 2018-05-29 NOTE — Patient Instructions (Addendum)
Medication Instructions:  Your physician recommends that you continue on your current medications as directed. Please refer to the Current Medication list given to you today.  Labwork: None ordered.  Testing/Procedures: None ordered.  Follow-Up: Your physician wants you to follow-up in: One Year with Dr Caryl Comes.  You will receive a reminder letter in the mail two months in advance. If you don't receive a letter, please call our office to schedule the follow-up appointment.   6 months with the device clinic  Any Other Special Instructions Will Be Listed Below (If Applicable).     If you need a refill on your cardiac medications before your next appointment, please call your pharmacy.

## 2018-06-09 ENCOUNTER — Other Ambulatory Visit: Payer: Self-pay | Admitting: Internal Medicine

## 2018-07-05 LAB — CUP PACEART INCLINIC DEVICE CHECK
Brady Statistic RA Percent Paced: 63 %
Brady Statistic RV Percent Paced: 4 %
Date Time Interrogation Session: 20190918200500
Implantable Lead Implant Date: 19980115
Implantable Lead Location: 753860
Lead Channel Impedance Value: 312 Ohm
Lead Channel Pacing Threshold Amplitude: 0.7 V
Lead Channel Setting Pacing Amplitude: 2 V
Lead Channel Setting Pacing Amplitude: 2.5 V
MDC IDC LEAD IMPLANT DT: 19980115
MDC IDC LEAD LOCATION: 753859
MDC IDC MSMT LEADCHNL RA PACING THRESHOLD PULSEWIDTH: 0.4 ms
MDC IDC MSMT LEADCHNL RV IMPEDANCE VALUE: 390 Ohm
MDC IDC MSMT LEADCHNL RV PACING THRESHOLD AMPLITUDE: 0.9 V
MDC IDC MSMT LEADCHNL RV PACING THRESHOLD PULSEWIDTH: 0.4 ms
MDC IDC PG IMPLANT DT: 20131007
MDC IDC SET LEADCHNL RV PACING PULSEWIDTH: 0.4 ms
Pulse Gen Serial Number: 66339555

## 2018-07-20 ENCOUNTER — Other Ambulatory Visit: Payer: Self-pay | Admitting: Internal Medicine

## 2018-09-19 ENCOUNTER — Other Ambulatory Visit: Payer: Self-pay | Admitting: Internal Medicine

## 2018-10-05 ENCOUNTER — Other Ambulatory Visit: Payer: Self-pay | Admitting: Internal Medicine

## 2018-11-27 ENCOUNTER — Encounter: Payer: Medicare Other | Admitting: Physician Assistant

## 2018-12-03 ENCOUNTER — Encounter: Payer: Self-pay | Admitting: *Deleted

## 2018-12-23 ENCOUNTER — Telehealth: Payer: Medicare Other | Admitting: Physician Assistant

## 2018-12-23 DIAGNOSIS — R112 Nausea with vomiting, unspecified: Secondary | ICD-10-CM

## 2018-12-23 NOTE — Progress Notes (Signed)
  E-Visit for Corona Virus Screening  Based on what you have shared with me, you need to seek an evaluation for a severe illness that is causing your symptoms which may be coronavirus or some other illness. I recommend that you be seen and evaluated "face to face". Our Emergency Departments are best equipped to handle patients with severe symptoms.   I recommend the following:  . If you are having a true medical emergency please call 911. . If you are considered high risk for Corona virus because of a known exposure, fever, shortness of breath and cough, OR if you have severe symptoms of any kind, seek medical care at an emergency room.  . Please call ahead and tell them that you were seen by telemedicine and they have recommended that you have a face to face evaluation. . [facility] Emergency Department [address], [place], [place] [address] [phone]  . [facility] [place] Emergency Department [address], [place], [place] [address] [phone]  . [facility] Emergency Department [address], [place], [place] [address] [phone]  . [facility] Emergency Department [address], [place], [place] [address] [phone]  . [facility] Emergency Department [address], [place], [place] [address] [phone]  NOTE: If you entered your credit card information for this eVisit, you will not be charged. You may see a "hold" on your card for the $35 but that hold will drop off and you will not have a charge processed.   Your e-visit answers were reviewed by a board certified advanced clinical practitioner to complete your personal care plan.  Thank you for using e-Visits.  

## 2019-01-16 ENCOUNTER — Encounter: Payer: Self-pay | Admitting: Internal Medicine

## 2019-01-16 MED ORDER — FLUOCINONIDE EMULSIFIED BASE 0.05 % EX CREA: TOPICAL_CREAM | CUTANEOUS | 2 refills | Status: AC

## 2019-01-20 ENCOUNTER — Telehealth: Payer: Self-pay

## 2019-01-20 NOTE — Telephone Encounter (Signed)
Pt states he would like to change his 01/23/2019 at 12:00pm for earlier that morning. Pt states he develop some conflict in his schedule. Pt also states anything between 10 am -11 am will be perfect for him. I told him I will let the scheduler know. The best number for the pt is [phone]

## 2019-01-20 NOTE — Telephone Encounter (Signed)
Spoke with patient, moved appointment from 5/14 to 5/13.

## 2019-01-22 ENCOUNTER — Other Ambulatory Visit: Payer: Self-pay

## 2019-01-22 ENCOUNTER — Ambulatory Visit (INDEPENDENT_AMBULATORY_CARE_PROVIDER_SITE_OTHER): Payer: Medicare Other | Admitting: *Deleted

## 2019-01-22 DIAGNOSIS — I480 Paroxysmal atrial fibrillation: Secondary | ICD-10-CM | POA: Diagnosis not present

## 2019-01-22 DIAGNOSIS — Z950 Presence of cardiac pacemaker: Secondary | ICD-10-CM

## 2019-01-22 LAB — CUP PACEART INCLINIC DEVICE CHECK
Brady Statistic RA Percent Paced: 62 %
Brady Statistic RV Percent Paced: 5 %
Date Time Interrogation Session: 20200513125908
Implantable Lead Implant Date: 19980115
Implantable Lead Implant Date: 19980115
Implantable Lead Location: 753859
Implantable Lead Location: 753860
Implantable Pulse Generator Implant Date: 20131007
Lead Channel Impedance Value: 312 Ohm
Lead Channel Impedance Value: 390 Ohm
Lead Channel Pacing Threshold Amplitude: 1 V
Lead Channel Pacing Threshold Pulse Width: 0.4 ms
Lead Channel Sensing Intrinsic Amplitude: 1.6 mV
Lead Channel Sensing Intrinsic Amplitude: 4.3 mV
Lead Channel Setting Pacing Amplitude: 2 V
Lead Channel Setting Pacing Amplitude: 2.5 V
Lead Channel Setting Pacing Pulse Width: 0.4 ms
Pulse Gen Serial Number: 66339555

## 2019-01-22 NOTE — Progress Notes (Signed)
Pacemaker check in clinic. Normal device function. Thresholds, sensing, impedances consistent with previous measurements. Device programmed to maximize longevity. AF burden 13 %, + Eliquis and Tikosyn. No high ventricular rates noted. Device programmed at appropriate safety margins. Histogram distribution appropriate for patient activity level. Device programmed to optimize intrinsic conduction. Estimated longevity 6 yrs 2 mo. Plan to follow uo 05/27/19 with Dr Caryl Comes. Patient education completed.

## 2019-01-27 DIAGNOSIS — Z03818 Encounter for observation for suspected exposure to other biological agents ruled out: Secondary | ICD-10-CM | POA: Diagnosis not present

## 2019-04-25 ENCOUNTER — Telehealth: Payer: Self-pay | Admitting: Student

## 2019-04-25 DIAGNOSIS — I4891 Unspecified atrial fibrillation: Secondary | ICD-10-CM

## 2019-04-25 MED ORDER — DOFETILIDE 500 MCG PO CAPS: ORAL_CAPSULE | ORAL | 0 refills | Status: DC

## 2019-04-25 NOTE — Telephone Encounter (Addendum)
   Received call that patient ran out of home Tikosyn 543mcg twice daily prescription. Usually gets refills from mail service but refill has not come in. He took his last dose last night around midnight. He only missed one dose today. Talked to Dr. Curt Bears. OK to miss one dose. Will send in 30-day prescription to [place] [facility]. Called and spoke with Pharmacy to confirm they will be able to fill prescription but patient has to get there by 7pm before they close. Patient states he will be able to make it there in time.    Darreld Mclean, PA-C 04/25/2019 6:17 PM   ADDENDUM:  Pharmacy called and said they actually only have the 250mg  tablets and asked it if it was OK to use those instead. I said that was OK. Patient will now be instructed to take two 270mcg tablets twice daily for equivalent home dose. Asked Pharmacy to clarify this with the patient. Prescription will now only be for 15 days because that is all the Pharmacy currently has in stock.  Darreld Mclean, PA-C 04/25/2019 7:21 PM

## 2019-05-27 ENCOUNTER — Encounter: Payer: Medicare Other | Admitting: Internal Medicine

## 2019-05-30 ENCOUNTER — Other Ambulatory Visit: Payer: Self-pay | Admitting: Internal Medicine

## 2019-05-30 DIAGNOSIS — I4891 Unspecified atrial fibrillation: Secondary | ICD-10-CM

## 2019-06-14 ENCOUNTER — Other Ambulatory Visit: Payer: Self-pay | Admitting: Internal Medicine

## 2019-06-14 DIAGNOSIS — I480 Paroxysmal atrial fibrillation: Secondary | ICD-10-CM

## 2019-06-14 DIAGNOSIS — Z5181 Encounter for therapeutic drug level monitoring: Secondary | ICD-10-CM

## 2019-06-16 NOTE — Telephone Encounter (Signed)
Pt is overdue for blood work and an office visit. Spoke with Doylene Canning, pt has a physician pacer check with Dr. Caryl Comes 10/27 will attempt to schedule labs at that time.   Called pt and LMOM for pt to call back and update him.

## 2019-06-17 NOTE — Telephone Encounter (Signed)
Called and spoke with pt. Informed and scheduled pt for blood work when he sees Dr. Caryl Comes on 07/08/2019.

## 2019-06-19 NOTE — Telephone Encounter (Signed)
Age 78, weight 97.4, last labs 05/14/18 Creat 1.15, based on specified criteria pt is on appropriate dosage of Eliquis 5mg  BID.  Will refill rx. Will refill x 1 only to get pt to appt with Dr Caryl Comes and labwork.

## 2019-06-25 DIAGNOSIS — I480 Paroxysmal atrial fibrillation: Secondary | ICD-10-CM

## 2019-06-27 NOTE — Telephone Encounter (Signed)
-----   Message from Alben Spittle sent at 06/26/2019 10:43 AM EDT ----- Regarding: echo order Good morning!   I couldn't figure out how to reply to the message from Dr. Caryl Comes.   I got this patient scheduled for an echo per Dr. Caryl Comes, can an order be put in so I can attach it to the appt?   Please and thank you!  [place] :)

## 2019-07-03 ENCOUNTER — Ambulatory Visit (HOSPITAL_COMMUNITY): Payer: Medicare Other | Attending: Cardiology

## 2019-07-03 ENCOUNTER — Other Ambulatory Visit: Payer: Self-pay

## 2019-07-03 DIAGNOSIS — I480 Paroxysmal atrial fibrillation: Secondary | ICD-10-CM | POA: Insufficient documentation

## 2019-07-07 ENCOUNTER — Telehealth: Payer: Self-pay

## 2019-07-07 ENCOUNTER — Encounter: Payer: Self-pay | Admitting: Internal Medicine

## 2019-07-07 NOTE — Telephone Encounter (Signed)

## 2019-07-08 ENCOUNTER — Other Ambulatory Visit: Payer: Self-pay

## 2019-07-08 ENCOUNTER — Telehealth (INDEPENDENT_AMBULATORY_CARE_PROVIDER_SITE_OTHER): Payer: Medicare Other | Admitting: Internal Medicine

## 2019-07-08 ENCOUNTER — Other Ambulatory Visit: Payer: Medicare Other

## 2019-07-08 VITALS — Wt 200.4 lb

## 2019-07-08 DIAGNOSIS — Z5181 Encounter for therapeutic drug level monitoring: Secondary | ICD-10-CM

## 2019-07-08 DIAGNOSIS — I428 Other cardiomyopathies: Secondary | ICD-10-CM | POA: Diagnosis not present

## 2019-07-08 DIAGNOSIS — I480 Paroxysmal atrial fibrillation: Secondary | ICD-10-CM

## 2019-07-08 DIAGNOSIS — Z950 Presence of cardiac pacemaker: Secondary | ICD-10-CM

## 2019-07-08 MED ORDER — CARVEDILOL 25 MG PO TABS: ORAL_TABLET | ORAL | 0 refills | Status: DC

## 2019-07-08 NOTE — Progress Notes (Signed)
Electrophysiology TeleHealth Note   Due to national recommendations of social distancing due to COVID 19, an audio/video telehealth visit is felt to be most appropriate for this patient at this time.  See MyChart message from today for the patient's consent to telehealth for [facility].   Date:  07/08/2019   ID:  [name], DOB 06/02/1941, MRN [id number]  Location: patient's home  Provider location: [address], [place]  Evaluation Performed: Follow-up visit  PCP:  Biagio Borg, MD  Cardiologist:     Electrophysiologist:  SK   Chief Complaint:  Atrial fib  History of Present Illness:    [name] is a 79 y.o. male who presents via audio/video conferencing for a telehealth visit today.  Since last being seen in our clinic, the patient reports  Excellent functional status.  Scant palpitations.  No dyspnea chest pain shortness of breath or peripheral edema.  His biggest concern is erectile dysfunction.  He dates its onset 2 late 28s when he was put on amiodarone and metoprolol.  For some period of time oral enhancers were effective but no longer; he ended up with an injection from urology but this was uncomfortable and unnatural.    DATE TEST EF   8/16 Echo  30-35%   3/17 Echo 35-40%   6 /17 Echo   40-45 %   10/17 Echo  40-45  %   4/18 Echo   45%   9/19 Echo  55-60%   10/20 Echo  50-55% Mod BAE   Date Cr K Hgb  4/18 1.08 4.7 13.8  9/19 1.15 4.7 12.7           The patient denies symptoms of fevers, chills, cough, or new SOB worrisome for COVID 19.    Past Medical History:  Diagnosis Date  . Allergic rhinitis    takes Allegra daily  . Allergic rhinitis, cause unspecified 06/22/2012  . Arthritis   . Atrial fibrillation ([place])    takes Tikosyn and Eliquis daily  . 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 Coronary artery disease    LAD stenting 2004 non-DES  . Depression    took Zoloft 50yrs ago but nothing now  .  Diverticulosis   . DVT (deep venous thrombosis) ([place])   . Dysphagia    occasionally  . History of blood clots 2003   left leg prior to fem pop  . History of colon polyps   . Hyperlipidemia    takes Tricor and Lipitor daily  . Insomnia    takes Ambien nightly as needed  . Joint pain   . Joint swelling   . 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 OSA (obstructive sleep apnea)    CPAP  . Pacemaker   . Peripheral vascular disease ([place])   . Pleurisy    early 45's  . Urinary urgency     Past Surgical History:  Procedure Laterality Date  . CARDIAC CATHETERIZATION  2004  . CARDIAC CATHETERIZATION N/A 05/06/2015   Procedure: Left Heart Cath and Coronary Angiography;  Surgeon: Belva Crome, MD;  Location: [facility];  Service: Cardiovascular;  Laterality: N/A;  . COLONOSCOPY    . hand and arm surgery Right    as a teenager  . HAND SURGERY Left   . INGUINAL HERNIA REPAIR Right   . JOINT REPLACEMENT Left December 09, 2013   Left Hip   .  JOINT REPLACEMENT Right Aug. 25, 2015   Right Hip  . left knee surgery    . PACEMAKER INSERTION     due to bradycardia  . PERMANENT PACEMAKER GENERATOR CHANGE N/A 06/17/2012   Procedure: PERMANENT PACEMAKER GENERATOR CHANGE;  Surgeon: Deboraha Sprang, MD;  Location: [facility] CATH LAB;  Service: Cardiovascular;  Laterality: N/A;  . right and left fem-pop bypass    . stent (unspec)     x2  . TOTAL HIP ARTHROPLASTY Left 12/09/2013   Procedure: TOTAL HIP ARTHROPLASTY ANTERIOR APPROACH;  Surgeon: Hessie Dibble, MD;  Location: [facility];  Service: Orthopedics;  Laterality: Left;  left anterior total hip arthroplasty  . TOTAL HIP ARTHROPLASTY Right 05/05/2014   Procedure: TOTAL HIP ARTHROPLASTY ANTERIOR APPROACH;  Surgeon: Hessie Dibble, MD;  Location: [facility];  Service: Orthopedics;  Laterality: Right;  . TURBINATE REDUCTION    . wisdom extracted       Current Outpatient Medications  Medication Sig Dispense Refill  . Amino Acids  (L-CARNITINE PO) Take 3 g by mouth daily.    Marland Kitchen atorvastatin (LIPITOR) 80 MG tablet Take 1 tablet (80 mg total) by mouth daily. Please keep upcoming appt with Dr. Caryl Comes in October before anymore refills. Thank you 90 tablet 0  . CALCIUM PO Take 600 mg by mouth 2 (two) times daily.    . carvedilol (COREG) 25 MG tablet TAKE 2 TABLETS BY MOUTH  TWICE A DAY 360 tablet 0  . Cholecalciferol (D3 VITAMIN PO) Take 4,000 Int'l Units by mouth daily.    . Coenzyme Q10 (COQ-10 PO) Take 300 mg by mouth daily.    . Cyanocobalamin (VITAMIN B 12 PO) Take 1 tablet by mouth daily.    Marland Kitchen dofetilide (TIKOSYN) 500 MCG capsule TAKE 1 CAPSULE BY MOUTH TWO TIMES DAILY 180 capsule 0  . ELIQUIS 5 MG TABS tablet TAKE 1 TABLET BY MOUTH TWO  TIMES DAILY 180 tablet 0  . fenofibrate 160 MG tablet Take 1 tablet (160 mg total) by mouth daily. Please keep upcoming appt with Dr. Caryl Comes in October before anymore refills. Thank you 90 tablet 0  . fexofenadine (ALLEGRA) 180 MG tablet Take 180 mg by mouth daily.    . Fluocinonide Emulsified Base 0.05 % CREA APPLY TO AFFECTED AREA(S)  TWO TIMES DAILY AS NEEDED 60 g 2  . FOLIC ACID PO Take 1 tablet by mouth daily.    Marland Kitchen GLUCOSAMINE CHONDROITIN COMPLX PO Take 1 tablet by mouth 2 (two) times daily.     Marland Kitchen losartan (COZAAR) 25 MG tablet Take 1 tablet (25 mg total) by mouth daily. 90 tablet 0  . Lysine 500 MG TABS Take 500 mg by mouth 3 (three) times daily.    . Magnesium 250 MG TABS Take 250 mg by mouth 2 (two) times daily.     . montelukast (SINGULAIR) 10 MG tablet Take 1 tablet (10 mg total) by mouth at bedtime. 90 tablet 2  . mupirocin nasal ointment (BACTROBAN NASAL) 2 % Place 1 application into the nose 2 (two) times daily. After application, press sides of nose together and gently massage. 10 g 0  . Omega-3 Fatty Acids (FISH OIL) 1200 MG CAPS Take 1,200 mg by mouth daily.     . potassium chloride (KLOR-CON) 10 MEQ tablet Take 1 tablet (10 mEq total) by mouth 2 (two) times daily. Please  keep upcoming appt with Dr. Caryl Comes in October before anymore refills. Thank you 180 tablet 0  . ranitidine (ZANTAC) 300  MG tablet Take 1 tablet (300 mg total) by mouth at bedtime. 90 tablet 2  . triamcinolone (NASACORT) 55 MCG/ACT AERO nasal inhaler Place 2 sprays into both nostrils daily.     Marland Kitchen zolpidem (AMBIEN) 10 MG tablet Take 0.5 tablets (5 mg total) by mouth at bedtime as needed for sleep. 90 tablet 1   No current facility-administered medications for this visit.     Allergies:   Amiodarone, Niacin, Ace inhibitors, and Mometasone furoate   Social History:  The patient  reports that he has never smoked. He has never used smokeless tobacco. He reports that he does not drink alcohol or use drugs.   Family History:  The patient's   family history includes Aneurysm in his father; Diabetes in his mother and another family member; Heart attack in his father; Heart disease in his father and mother; Peripheral vascular disease in his father; Sleep apnea in an other family member; Stroke in his father.   ROS:  Please see the history of present illness.   All other systems are personally reviewed and negative.    Exam:    Vital Signs:  Wt 200 lb 6.4 oz (90.9 kg)   BMI 25.05 kg/m     Well appearing, alert and conversant, regular work of breathing,  good skin color Eyes- anicteric, neuro- grossly intact, skin- no apparent rash or lesions or cyanosis, mouth- oral mucosa is pink   Labs/Other Tests and Data Reviewed:    Recent Labs: No results found for requested labs within last 8760 hours.   Wt Readings from Last 3 Encounters:  07/08/19 200 lb 6.4 oz (90.9 kg)  05/29/18 214 lb 12.8 oz (97.4 kg)  09/28/17 211 lb (95.7 kg)     Other studies personally reviewed: Additional studies/ records that were reviewed today include:    Last device remote is reviewed from [place] PDF dated 8/20 whichreveals normal device function,   arrhythmias -Afib    ASSESSMENT & PLAN:   Atrial  fibrillation-paroxysmal  Sinus node dysfunction  Symptomatic ventricular pacing  Pacemaker Biotronik The patient's device was interrogated.  The information was reviewed. No changes were made in the programming.      IVCD// LBBB -question rate related  Nonischemic cardiomyopathy improved   Coronary artery disease A) prior stenting B) diffuse nonobstructive disease  Erectile dysfunction Still  Working without limitation.  No bleeding on Eliquis.  His major concern is erectile dysfunction which he dates to the initiation of amiodarone and metoprolol in the late 90s.  He has tried oral enhancers without success.  He ended up seeing urology for penile injection which was not particularly satisfying.  He asked about dofetilide; a brief literature search on pub med demonstrated no reported association.  He asked about losartan and indeed there is some enhancing effects  He would like to undertake a carvedilol withdrawal trial.  We will decrease from 50 twice daily--25 twice daily for 1 week, 12.5 twice daily for 1 week 6.25 twice daily for 1 week 6.25 daily for 1 week and then we will regroup in about 4 weeks thereafter i.e. about 8 weeks from now to reassess.  We also discussed the implications of microinfarcts assoc with Afib      COVID 19 screen The patient denies symptoms of COVID 19 at this time.  The importance of social distancing was discussed today.  Follow-up:  8 w telehealth visit   Next remote: As Scheduled   Current medicines are reviewed at length with the  patient today.   The patient does not have concerns regarding his medicines.  The following changes were made today:  As above   Labs/ tests ordered today include: pending  No orders of the defined types were placed in this encounter.   Future tests ( post COVID )    Patient Risk:  after full review of this patients clinical status, I feel that they are at moderate risk at this time.  Today, I have spent  27 minutes with the patient with telehealth technology discussing the above.  Signed, Virl Axe, MD  07/08/2019 2:25 PM     [address] [place] [address] [phone] (office) [phone] (fax)

## 2019-07-08 NOTE — Patient Instructions (Signed)
Medication Instructions:  Your physician has recommended you make the following change in your medication:  1.) carvedilol--decrease dose as recommended: Take  1 tablet (25 mg) by mouth twice a day for one week, then  1/2 tablet (12.5 mg) by mouth twice a day for one week, then 1/4 tablet (6.25 mg) by mouth twice a day for one week, then 1/4 tablet (6.25 mg) by mouth ONCE A DAY for one week then STOP  Your next virtual visit should be about 4 weeks after you STOP carvedilol.  We will arrange this for you.  *If you need a refill on your cardiac medications before your next appointment, please call your pharmacy*  Lab Work: none If you have labs (blood work) drawn today and your tests are completely normal, you will receive your results only by: Marland Kitchen MyChart Message (if you have MyChart) OR . A paper copy in the mail If you have any lab test that is abnormal or we need to change your treatment, we will call you to review the results.  Testing/Procedures: none  Follow-Up: At [facility], you and your health needs are our priority.  As part of our continuing mission to provide you with exceptional heart care, we have created designated Provider Care Teams.  These Care Teams include your primary Cardiologist (physician) and Advanced Practice Providers (APPs -  Physician Assistants and Nurse Practitioners) who all work together to provide you with the care you need, when you need it.  Your next appointment:  IN ABOUT: 8 weeks  The format for your next appointment:   Virtual Visit   Provider:   Virl Axe, MD  Other Instructions

## 2019-07-09 LAB — CBC
Hematocrit: 42 % (ref 37.5–51.0)
Hemoglobin: 14.3 g/dL (ref 13.0–17.7)
MCH: 32.5 pg (ref 26.6–33.0)
MCHC: 34 g/dL (ref 31.5–35.7)
MCV: 96 fL (ref 79–97)
Platelets: 154 10*3/uL (ref 150–450)
RBC: 4.4 x10E6/uL (ref 4.14–5.80)
RDW: 12.3 % (ref 11.6–15.4)
WBC: 5 10*3/uL (ref 3.4–10.8)

## 2019-07-09 LAB — BASIC METABOLIC PANEL
BUN/Creatinine Ratio: 14 (ref 10–24)
BUN: 17 mg/dL (ref 8–27)
CO2: 25 mmol/L (ref 20–29)
Calcium: 9.9 mg/dL (ref 8.6–10.2)
Chloride: 105 mmol/L (ref 96–106)
Creatinine, Ser: 1.2 mg/dL (ref 0.76–1.27)
GFR calc Af Amer: 66 mL/min/{1.73_m2} (ref >59)
GFR calc non Af Amer: 57 mL/min/{1.73_m2} — ABNORMAL LOW (ref >59)
Glucose: 100 mg/dL — ABNORMAL HIGH (ref 65–99)
Potassium: 4.8 mmol/L (ref 3.5–5.2)
Sodium: 142 mmol/L (ref 134–144)

## 2019-07-18 ENCOUNTER — Other Ambulatory Visit: Payer: Self-pay

## 2019-07-18 DIAGNOSIS — Z20822 Contact with and (suspected) exposure to covid-19: Secondary | ICD-10-CM

## 2019-07-19 LAB — NOVEL CORONAVIRUS, NAA: SARS-CoV-2, NAA: NOT DETECTED

## 2019-08-05 DIAGNOSIS — H2513 Age-related nuclear cataract, bilateral: Secondary | ICD-10-CM | POA: Diagnosis not present

## 2019-08-05 DIAGNOSIS — H524 Presbyopia: Secondary | ICD-10-CM | POA: Diagnosis not present

## 2019-08-05 DIAGNOSIS — H35371 Puckering of macula, right eye: Secondary | ICD-10-CM | POA: Diagnosis not present

## 2019-08-13 ENCOUNTER — Encounter: Payer: Self-pay | Admitting: Internal Medicine

## 2019-09-10 DIAGNOSIS — I447 Left bundle-branch block, unspecified: Secondary | ICD-10-CM | POA: Insufficient documentation

## 2019-09-10 DIAGNOSIS — I495 Sick sinus syndrome: Secondary | ICD-10-CM | POA: Insufficient documentation

## 2019-09-11 ENCOUNTER — Telehealth (INDEPENDENT_AMBULATORY_CARE_PROVIDER_SITE_OTHER): Payer: Medicare Other | Admitting: Internal Medicine

## 2019-09-11 ENCOUNTER — Encounter: Payer: Self-pay | Admitting: Internal Medicine

## 2019-09-11 ENCOUNTER — Other Ambulatory Visit: Payer: Self-pay

## 2019-09-11 VITALS — Ht 75.0 in | Wt 199.1 lb

## 2019-09-11 DIAGNOSIS — I480 Paroxysmal atrial fibrillation: Secondary | ICD-10-CM

## 2019-09-11 DIAGNOSIS — I447 Left bundle-branch block, unspecified: Secondary | ICD-10-CM | POA: Diagnosis not present

## 2019-09-11 DIAGNOSIS — I495 Sick sinus syndrome: Secondary | ICD-10-CM | POA: Diagnosis not present

## 2019-09-11 DIAGNOSIS — Z950 Presence of cardiac pacemaker: Secondary | ICD-10-CM | POA: Diagnosis not present

## 2019-09-11 NOTE — Progress Notes (Signed)
Electrophysiology TeleHealth Note   Due to national recommendations of social distancing due to COVID 19, an audio/video telehealth visit is felt to be most appropriate for this patient at this time.  See MyChart message from today for the patient's consent to telehealth for [facility].   Date:  09/11/2019   ID:  [name], DOB May 11, 1941, MRN [id number]  Location: patient's home  Provider location: [address] [place]  Evaluation Performed: Follow-up visit  PCP:  Biagio Borg, MD  Cardiologist:     Electrophysiologist:  SK   Chief Complaint:  Atrial fib  History of Present Illness:    [name] is a 78 y.o. male who presents via audio  conferencing for a telehealth visit today.  Since last being seen in our clinic for atrial fibrillation and ED , the patient reports weaning off carvedilol and was off for about a month, and increased losartan and felt no different so resumed his prior doses Now back on his previous doses  No benefit for ED meds and injections were not fruitful either   The patient denies chest pain, shortness of breath, nocturnal dyspnea, orthopnea or peripheral edema.  There have been no palpitations, lightheadedness or syncope.    DATE TEST EF   8/16 Echo  30-35%   3/17 Echo 35-40%   6 /17 Echo   40-45 %   10/17 Echo  40-45  %   4/18 Echo   45%   9/19 Echo  55-60%   10/20 Echo  50-55% Mod BAE   Date Cr K Hgb  4/18 1.08 4.7 13.8  9/19 1.15 4.7 12.7  10/20 1.20 4.8 14.3    On Anticoagulation;  No bleeding issues        Past Medical History:  Diagnosis Date  . Allergic rhinitis    takes Allegra daily  . Allergic rhinitis, cause unspecified 06/22/2012  . Arthritis   . Atrial fibrillation ([place])    takes Tikosyn and Eliquis daily  . 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 Coronary artery disease    LAD stenting 2004 non-DES  . Depression    took Zoloft 97yrs ago but nothing now  . Diverticulosis     . DVT (deep venous thrombosis) ([place])   . Dysphagia    occasionally  . History of blood clots 2003   left leg prior to fem pop  . History of colon polyps   . Hyperlipidemia    takes Tricor and Lipitor daily  . Insomnia    takes Ambien nightly as needed  . Joint pain   . Joint swelling   . 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 OSA (obstructive sleep apnea)    CPAP  . Pacemaker   . Peripheral vascular disease ([place])   . Pleurisy    early 76's  . Urinary urgency     Past Surgical History:  Procedure Laterality Date  . CARDIAC CATHETERIZATION  2004  . CARDIAC CATHETERIZATION N/A 05/06/2015   Procedure: Left Heart Cath and Coronary Angiography;  Surgeon: Belva Crome, MD;  Location: [facility];  Service: Cardiovascular;  Laterality: N/A;  . COLONOSCOPY    . hand and arm surgery Right    as a teenager  . HAND SURGERY Left   . INGUINAL HERNIA REPAIR Right   . JOINT REPLACEMENT Left December 09, 2013   Left Hip   . JOINT REPLACEMENT  Right Aug. 25, 2015   Right Hip  . left knee surgery    . PACEMAKER INSERTION     due to bradycardia  . PERMANENT PACEMAKER GENERATOR CHANGE N/A 06/17/2012   Procedure: PERMANENT PACEMAKER GENERATOR CHANGE;  Surgeon: Deboraha Sprang, MD;  Location: [facility] CATH LAB;  Service: Cardiovascular;  Laterality: N/A;  . right and left fem-pop bypass    . stent (unspec)     x2  . TOTAL HIP ARTHROPLASTY Left 12/09/2013   Procedure: TOTAL HIP ARTHROPLASTY ANTERIOR APPROACH;  Surgeon: Hessie Dibble, MD;  Location: [facility];  Service: Orthopedics;  Laterality: Left;  left anterior total hip arthroplasty  . TOTAL HIP ARTHROPLASTY Right 05/05/2014   Procedure: TOTAL HIP ARTHROPLASTY ANTERIOR APPROACH;  Surgeon: Hessie Dibble, MD;  Location: [facility];  Service: Orthopedics;  Laterality: Right;  . TURBINATE REDUCTION    . wisdom extracted       Current Outpatient Medications  Medication Sig Dispense Refill  . atorvastatin (LIPITOR) 80 MG tablet  Take 80 mg by mouth daily.    Marland Kitchen CALCIUM PO Take 600 mg by mouth 2 (two) times daily.    . carvedilol (COREG) 25 MG tablet Take 25 mg by mouth 4 (four) times daily.    . Cholecalciferol (D3 VITAMIN PO) Take 4,000 Int'l Units by mouth daily.    . Coenzyme Q10 (COQ-10 PO) Take 300 mg by mouth daily.    . Cyanocobalamin (VITAMIN B 12 PO) Take 1 tablet by mouth daily.    Marland Kitchen dofetilide (TIKOSYN) 500 MCG capsule TAKE 1 CAPSULE BY MOUTH TWO TIMES DAILY 180 capsule 0  . ELIQUIS 5 MG TABS tablet TAKE 1 TABLET BY MOUTH TWO  TIMES DAILY 180 tablet 0  . fenofibrate 160 MG tablet Take 1 tablet (160 mg total) by mouth daily. Please keep upcoming appt with Dr. Caryl Comes in October before anymore refills. Thank you 90 tablet 0  . fexofenadine (ALLEGRA) 180 MG tablet Take 180 mg by mouth daily.    . Fluocinonide Emulsified Base 0.05 % CREA APPLY TO AFFECTED AREA(S)  TWO TIMES DAILY AS NEEDED 60 g 2  . FOLIC ACID PO Take 1 tablet by mouth daily.    Marland Kitchen GLUCOSAMINE CHONDROITIN COMPLX PO Take 1 tablet by mouth 2 (two) times daily.     Marland Kitchen losartan (COZAAR) 25 MG tablet Take 1 tablet (25 mg total) by mouth daily. 90 tablet 0  . Lysine 500 MG TABS Take 500 mg by mouth 3 (three) times daily.    . Magnesium 250 MG TABS Take 250 mg by mouth 2 (two) times daily.     . Omega-3 Fatty Acids (FISH OIL) 1200 MG CAPS Take 1,200 mg by mouth daily.     . potassium chloride (KLOR-CON) 10 MEQ tablet Take 1 tablet (10 mEq total) by mouth 2 (two) times daily. Please keep upcoming appt with Dr. Caryl Comes in October before anymore refills. Thank you 180 tablet 0  . ranitidine (ZANTAC) 300 MG tablet Take 1 tablet (300 mg total) by mouth at bedtime. 90 tablet 2  . triamcinolone (NASACORT) 55 MCG/ACT AERO nasal inhaler Place 2 sprays into both nostrils daily.     Marland Kitchen zolpidem (AMBIEN) 10 MG tablet Take 0.5 tablets (5 mg total) by mouth at bedtime as needed for sleep. 90 tablet 1   No current facility-administered medications for this visit.     Allergies:   Amiodarone, Niacin, Ace inhibitors, and Mometasone furoate   Social History:  The patient  reports that he has never smoked. He has never used smokeless tobacco. He reports that he does not drink alcohol or use drugs.   Family History:  The patient's   family history includes Aneurysm in his father; Diabetes in his mother and another family member; Heart attack in his father; Heart disease in his father and mother; Peripheral vascular disease in his father; Sleep apnea in an other family member; Stroke in his father.   ROS:  Please see the history of present illness.   All other systems are personally reviewed and negative.    Exam:    Vital Signs:  Ht 6\' 3"  (1.905 m)   Wt 199 lb 1.6 oz (90.3 kg)   BMI 24.89 kg/m        Labs/Other Tests and Data Reviewed:    Recent Labs: 07/08/2019: BUN 17; Creatinine, Ser 1.20; Hemoglobin 14.3; Platelets 154; Potassium 4.8; Sodium 142   Wt Readings from Last 3 Encounters:  09/11/19 199 lb 1.6 oz (90.3 kg)  07/08/19 200 lb 6.4 oz (90.9 kg)  05/29/18 214 lb 12.8 oz (97.4 kg)     Other studies personally reviewed: Additional studies/ records that were reviewed today include:    Last device remote is reviewed from [place] PDF dated 8/20 whichreveals normal device function,   arrhythmias -Afib    ASSESSMENT & PLAN:   Atrial fibrillation-paroxysmal  Sinus node dysfunction  Symptomatic ventricular pacing  Pacemaker Biotronik The patient's device was interrogated.  The information was reviewed. No changes were made in the programming.      IVCD// LBBB -question rate related  Nonischemic cardiomyopathy improved   Coronary artery disease A) prior stenting B) diffuse nonobstructive disease  Erectile dysfunction    Will continue on current meds   He is increasingly resigned to his ED   Continue current meds  On Anticoagulation;  No bleeding issues      COVID 19 screen The patient denies symptoms of COVID  19 at this time.  The importance of social distancing was discussed today.  Follow-up:  11m  Next remote: As Scheduled   Current medicines are reviewed at length with the patient today.   The patient has concerns regarding his medicines.  The following changes were made today:  None   Labs/ tests ordered today include: pending  No orders of the defined types were placed in this encounter.   Future tests ( post COVID )    Patient Risk:  after full review of this patients clinical status, I feel that they are at moderate risk at this time.  Today, I have spent 11  minutes with the patient with telehealth technology discussing the above.  Signed, Virl Axe, MD  09/11/2019 12:37 PM     [facility] [address] [place] [address] [phone] (office) [phone] (fax)

## 2019-09-19 ENCOUNTER — Other Ambulatory Visit: Payer: Medicare Other

## 2019-09-21 ENCOUNTER — Ambulatory Visit: Payer: Medicare Other | Attending: Internal Medicine

## 2019-09-21 DIAGNOSIS — Z23 Encounter for immunization: Secondary | ICD-10-CM | POA: Insufficient documentation

## 2019-09-21 NOTE — Progress Notes (Signed)
   Covid-19 Vaccination Clinic  Name:  KEYMANI BETTEN    MRN: [id number] DOB: 10-Mar-1941  09/21/2019  Mr. Okray was observed post Covid-19 immunization for 30 minutes based on pre-vaccination screening without incidence. He was provided with Vaccine Information Sheet and instruction to access the V-Safe system.   Mr. Remmick was instructed to call 911 with any severe reactions post vaccine: Marland Kitchen Difficulty breathing  . Swelling of your face and throat  . A fast heartbeat  . A bad rash all over your body  . Dizziness and weakness    Immunizations Administered    Name Date Dose VIS Date Route   Pfizer COVID-19 Vaccine 09/21/2019 11:46 AM 0.3 mL 08/22/2019 Intramuscular   Manufacturer: [employer]   Lot: [id number]   [place]: [id number]

## 2019-10-04 ENCOUNTER — Other Ambulatory Visit: Payer: Self-pay | Admitting: Internal Medicine

## 2019-10-04 DIAGNOSIS — I4891 Unspecified atrial fibrillation: Secondary | ICD-10-CM

## 2019-10-09 ENCOUNTER — Ambulatory Visit: Payer: Medicare Other

## 2019-10-11 ENCOUNTER — Ambulatory Visit: Payer: Medicare Other | Attending: Internal Medicine

## 2019-10-11 DIAGNOSIS — Z23 Encounter for immunization: Secondary | ICD-10-CM | POA: Insufficient documentation

## 2019-10-11 NOTE — Progress Notes (Signed)
   Covid-19 Vaccination Clinic  Name:  [name]    MRN: [id number] DOB: 17-Oct-1940  10/11/2019  Mr. Grass was observed post Covid-19 immunization for 15 minutes without incidence. He was provided with Vaccine Information Sheet and instruction to access the V-Safe system.   [name] was instructed to call 911 with any severe reactions post vaccine: Marland Kitchen Difficulty breathing  . Swelling of your face and throat  . A fast heartbeat  . A bad rash all over your body  . Dizziness and weakness    Immunizations Administered    Name Date Dose VIS Date Route   Pfizer COVID-19 Vaccine 10/11/2019 10:27 AM 0.3 mL 08/22/2019 Intramuscular   Manufacturer: [place]   Lot: [id number]   [place]: [id number]

## 2019-10-18 ENCOUNTER — Ambulatory Visit: Payer: Medicare Other

## 2019-10-31 ENCOUNTER — Other Ambulatory Visit: Payer: Self-pay | Admitting: Internal Medicine

## 2019-11-03 NOTE — Telephone Encounter (Signed)
Prescription refill request for Eliquis received.  Last office visit: [name] 09/11/2019 Scr: 1.20, 07/08/2019 Age: 79 y.o. Weight: 90.3 kg   Prescription refill sent.

## 2020-05-03 ENCOUNTER — Telehealth: Payer: Medicare Other | Admitting: Physician Assistant

## 2020-05-03 ENCOUNTER — Encounter: Payer: Self-pay | Admitting: Internal Medicine

## 2020-05-03 DIAGNOSIS — R42 Dizziness and giddiness: Secondary | ICD-10-CM

## 2020-05-04 ENCOUNTER — Other Ambulatory Visit: Payer: Self-pay | Admitting: Critical Care Medicine

## 2020-05-04 ENCOUNTER — Other Ambulatory Visit: Payer: Medicare Other

## 2020-05-04 ENCOUNTER — Encounter: Payer: Self-pay | Admitting: Internal Medicine

## 2020-05-04 DIAGNOSIS — Z20822 Contact with and (suspected) exposure to covid-19: Secondary | ICD-10-CM

## 2020-05-04 NOTE — Progress Notes (Signed)
Based on what you shared with me, I feel your condition warrants further evaluation and I recommend that you be seen for a face to face office visit.   NOTE: If you entered your credit card information for this eVisit, you will not be charged. You may see a "hold" on your card for the $35 but that hold will drop off and you will not have a charge processed.   If you are having a true medical emergency please call 911.      For an urgent face to face visit, [facility] has five urgent care centers for your convenience:      NEW:  [facility] [place] [facility] [place] Get Driving Directions [phone] [place], [place] [address] . 10 am - 6pm Monday - Friday    [facility] [place] [facility]) Get Driving Directions [phone] [address] [place], [place] [address] . 10 am to 8 pm Monday-Friday . 12 pm to 8 pm [facility] Get Driving Directions [phone] [place], [place], [place] [address] . 8 am to 8 pm Monday-Friday . 9 am to 6 pm Saturday . 11 am to 6 pm Sunday     [facility] Get Driving Directions  [phone] [address] [place], [place] [address] . 8 am to 8 pm Monday-Friday . 8 am to 4 pm [facility] at [place] Get Driving Directions [phone] [place] [place],  [address] . 12 pm to 6 pm Monday-Friday      Your e-visit answers were reviewed by a board certified advanced clinical practitioner to complete your personal care plan.  Thank you for using e-Visits.    Approximately 5 minutes was spent documenting and reviewing patient's chart.

## 2020-05-05 LAB — SARS-COV-2, NAA 2 DAY TAT

## 2020-05-05 LAB — NOVEL CORONAVIRUS, NAA: SARS-CoV-2, NAA: NOT DETECTED

## 2020-05-06 ENCOUNTER — Encounter: Payer: Self-pay | Admitting: Internal Medicine

## 2020-06-01 ENCOUNTER — Encounter: Payer: Self-pay | Admitting: Internal Medicine

## 2020-06-01 ENCOUNTER — Other Ambulatory Visit: Payer: Self-pay

## 2020-06-01 ENCOUNTER — Ambulatory Visit: Payer: Medicare Other | Admitting: Internal Medicine

## 2020-06-01 VITALS — BP 118/76 | HR 67 | Ht 75.0 in | Wt 213.6 lb

## 2020-06-01 DIAGNOSIS — I495 Sick sinus syndrome: Secondary | ICD-10-CM | POA: Diagnosis not present

## 2020-06-01 DIAGNOSIS — Z950 Presence of cardiac pacemaker: Secondary | ICD-10-CM | POA: Diagnosis not present

## 2020-06-01 DIAGNOSIS — I5022 Chronic systolic (congestive) heart failure: Secondary | ICD-10-CM | POA: Diagnosis not present

## 2020-06-01 DIAGNOSIS — I447 Left bundle-branch block, unspecified: Secondary | ICD-10-CM | POA: Diagnosis not present

## 2020-06-01 DIAGNOSIS — I4891 Unspecified atrial fibrillation: Secondary | ICD-10-CM | POA: Diagnosis not present

## 2020-06-01 MED ORDER — RANOLAZINE ER 500 MG PO TB12: 500.0000 mg | ORAL_TABLET | Freq: Two times a day (BID) | ORAL | 3 refills | Status: DC

## 2020-06-01 NOTE — Progress Notes (Signed)
Patient Care Team: Biagio Borg, MD as PCP - General (Internal Medicine) Deboraha Sprang, MD (Cardiology) Melrose Nakayama, MD as Consulting Physician (Orthopedic Surgery) Biagio Borg, MD as Consulting Physician (Internal Medicine)   HPI  [name] is a 79 y.o. 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   His pacemaker reached ERI 8/13 and he underwent generator replacement with a Biotronik device because of his CLS function  He takes dofetilide for his atrial fibrillation    At his last visit in August he was noted to have new left bundle branch block. Assessment of LV function surprisingly demonstrated significant impairment in LV function 30-35% and he subsequently underwent catheterization demonstrating modest nonobstructive disease confirming LV dysfunction  >>Diffuse noncritical coronary disease involving each of the 3 major coronaries. Both the mid RCA and mid LAD contained up to 65% stenosis. The previously placed proximal LAD stent is widely patent. The small first diagonal contains 90% stenosis and is jailed by the stent.  He thinks that the improved ejection fraction relates to his carvedilol dose being gradually uptitrated to 50 mg twice daily in the recent introduction of vitamin D.  He also brings a paper from [place] called the VINDICATE TRIAL demonstrating improvement in a small study of ejection fraction with vitamin D  He notes shortness of breath when walking on construction loss around projects.  He has had lightheadedness and tiredness [facility]  He has recorded blood pressures on the machine there.  They range from 96-789 systolic but there seems to be no correlation between the blood pressure in the day that he is tired.  DATE TEST EF   8/16 LHC  LADp-stented-patent; LADm-60%;D1-90%  jailed LCXp-45%;RCAp-40%^  8/16 Echo  30-35%   3/17 Echo 35-40%   6 /17 +Echo   40-45 %   10/17 Echo  40-45  %   4/18 Echo   45%   9/19 Echo  50-55%   10/20 Echo  50-55%    Date Cr K Hgb  4/18 1.08 4.7 13.8  9/19 1.15 4.7 12.7  10/20 1.20 4.8 14.3       Some more recent palpitations.  Consistent with his PVCs.  Past Medical History:  Diagnosis Date  . Allergic rhinitis    takes Allegra daily  . Allergic rhinitis, cause unspecified 06/22/2012  . Arthritis   . Atrial fibrillation ([place])    takes Tikosyn and Eliquis daily  . 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 Coronary artery disease    LAD stenting 2004 non-DES  . Depression    took Zoloft 7yrs ago but nothing now  . Diverticulosis   . DVT (deep venous thrombosis) ([place])   . Dysphagia    occasionally  . History of blood clots 2003   left leg prior to fem pop  . History of colon polyps   . Hyperlipidemia    takes Tricor and Lipitor daily  . Insomnia    takes Ambien nightly as needed  . Joint pain   . Joint swelling   . 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 OSA (obstructive sleep apnea)    CPAP  . Pacemaker   . Peripheral vascular disease ([place])   . Pleurisy    early 70's  . Urinary urgency     Past  Surgical History:  Procedure Laterality Date  . CARDIAC CATHETERIZATION  2004  . CARDIAC CATHETERIZATION N/A 05/06/2015   Procedure: Left Heart Cath and Coronary Angiography;  Surgeon: Belva Crome, MD;  Location: [facility];  Service: Cardiovascular;  Laterality: N/A;  . COLONOSCOPY    . hand and arm surgery Right    as a teenager  . HAND SURGERY Left   . INGUINAL HERNIA REPAIR Right   . JOINT REPLACEMENT Left December 09, 2013   Left Hip   . JOINT REPLACEMENT Right Aug. 25, 2015   Right Hip  . left knee surgery    . PACEMAKER INSERTION     due to bradycardia  . PERMANENT PACEMAKER GENERATOR CHANGE N/A 06/17/2012   Procedure: PERMANENT PACEMAKER GENERATOR CHANGE;   Surgeon: Deboraha Sprang, MD;  [facility] CATH LAB;  Service: Cardiovascular;  Laterality: N/A;  . right and left fem-pop bypass    . stent (unspec)     x2  . TOTAL HIP ARTHROPLASTY Left 12/09/2013   Procedure: TOTAL HIP ARTHROPLASTY ANTERIOR APPROACH;  Surgeon: Hessie Dibble, MD;  Location: [facility];  Service: Orthopedics;  Laterality: Left;  left anterior total hip arthroplasty  . TOTAL HIP ARTHROPLASTY Right 05/05/2014   Procedure: TOTAL HIP ARTHROPLASTY ANTERIOR APPROACH;  Surgeon: Hessie Dibble, MD;  Location: [facility];  Service: Orthopedics;  Laterality: Right;  . TURBINATE REDUCTION    . wisdom extracted       Current Outpatient Medications  Medication Sig Dispense Refill  . atorvastatin (LIPITOR) 80 MG tablet TAKE 1 TABLET BY MOUTH  DAILY 90 tablet 3  . CALCIUM PO Take 600 mg by mouth 2 (two) times daily.    . carvedilol (COREG) 25 MG tablet TAKE 2 TABLETS BY MOUTH  TWICE DAILY 360 tablet 3  . Cholecalciferol (D3 VITAMIN PO) Take 4,000 Int'l Units by mouth daily.    . Coenzyme Q10 (COQ-10 PO) Take 300 mg by mouth daily.    . Cyanocobalamin (VITAMIN B 12 PO) Take 1 tablet by mouth daily.    Marland Kitchen dofetilide (TIKOSYN) 500 MCG capsule TAKE 1 CAPSULE BY MOUTH  TWICE DAILY 180 capsule 3  . ELIQUIS 5 MG TABS tablet TAKE 1 TABLET BY MOUTH  TWICE DAILY 180 tablet 1  . fenofibrate 160 MG tablet TAKE 1 TABLET BY MOUTH  DAILY 90 tablet 3  . fexofenadine (ALLEGRA) 180 MG tablet Take 180 mg by mouth daily.    . Fluocinonide Emulsified Base 0.05 % CREA APPLY TO AFFECTED AREA(S)  TWO TIMES DAILY AS NEEDED 60 g 2  . FOLIC ACID PO Take 1 tablet by mouth daily.    Marland Kitchen GLUCOSAMINE CHONDROITIN COMPLX PO Take 1 tablet by mouth 2 (two) times daily.     Marland Kitchen losartan (COZAAR) 25 MG tablet TAKE 1 TABLET BY MOUTH  DAILY 90 tablet 3  . Lysine 500 MG TABS Take 500 mg by mouth 3 (three) times daily.    . Magnesium 250 MG TABS Take 250 mg by mouth 2 (two) times daily.     . Omega-3 Fatty Acids (FISH OIL) 1200 MG CAPS  Take 1,200 mg by mouth daily.     . potassium chloride (KLOR-CON) 10 MEQ tablet TAKE 1 TABLET BY MOUTH  TWICE DAILY 180 tablet 3  . ranitidine (ZANTAC) 300 MG tablet Take 1 tablet (300 mg total) by mouth at bedtime. 90 tablet 2  . triamcinolone (NASACORT) 55 MCG/ACT AERO nasal inhaler Place 2 sprays into both  nostrils daily.     Marland Kitchen zolpidem (AMBIEN) 10 MG tablet Take 0.5 tablets (5 mg total) by mouth at bedtime as needed for sleep. 90 tablet 1   No current facility-administered medications for this visit.    Allergies  Allergen Reactions  . Amiodarone     REACTION: peripheral neuropathy  . Niacin     REACTION: irregular heartbeat  . Ace Inhibitors     REACTION: cough  . Mometasone Furoate Other (See Comments)    (Nasonex) Causes nasal bleeding and nose bleeds    Review of Systems negative except from HPI and PMH  Physical Exam BP 118/76   Pulse 67   Ht 6\' 3"  (1.905 m)   Wt 213 lb 9.6 oz (96.9 kg)   SpO2 90%   BMI 26.70 kg/m  Well developed and well nourished in no acute distress HENT normal Neck supple with JVP-flat Clear Device pocket well healed; without hematoma or erythema.  There is no tethering  Regular rate and rhythm, no  murmur Abd-soft with active BS No Clubbing cyanosis   edema Skin-warm and dry A & Oriented  Grossly normal sensory and motor function  ECG Apacing 67 24/14/48  LBBB  Assessment and  Plan  Atrial fibrillation-paroxysmal  Symptomatic ventricular pacing  Pacemaker Biotronik The patient's device was interrogated.  The information was reviewed. No changes were made in the programming.      IVCD// LBBB -question rate related  Nonischemic cardiomyopathy improved   Coronary artery disease A) prior stenting B) diffuse nonobstructive disease    Patient's symptoms of exercise intolerance could be secondary to ischemia with progression of his coronary disease, cardiomyopathic related to the left bundle branch block, atrial fibrillation with  his burden having increased from 14 to 36%.  We had extensive discussions about the different possibilities, and they have agreed to pursue a trial of ranolazine both for its antiarrhythmic potentiating benefit to dofetilide as well as his anti-ischemic properties.  I will review with one of the nuclear medicine docs as to whether the jailed diagonal stent would impair diagnostic accuracy to the point of not being a useful study.  In the event that we can use this we will do this both to assess perfusion as well as left ventricular function.  Otherwise, we will need to look at an echo to see if there is any deterioration in LV function potentially related coincidentally with or consequential to his left bundle branch block.  The atrial fibrillation may also be contributing to his functional deterioration although the fact that his symptoms are relatively persisting as opposed to variable makes me wonder, because his atrial fibrillation is quite variable on a day-to-day basis.  His atrial fibrillation rates can be quite rapid.  Augmented rate control might be tolerable perhaps a little bit of a calcium blocker if his LV function remains good.

## 2020-06-01 NOTE — Patient Instructions (Signed)
Medication Instructions:  Your physician has recommended you make the following change in your medication:   **Begin taking Ranolazine 500mg  - 1 tablet by mouth twice daily.  *If you need a refill on your cardiac medications before your next appointment, please call your pharmacy*   Lab Work: None ordered.  If you have labs (blood work) drawn today and your tests are completely normal, you will receive your results only by: Marland Kitchen MyChart Message (if you have MyChart) OR . A paper copy in the mail If you have any lab test that is abnormal or we need to change your treatment, we will call you to review the results.   Testing/Procedures: None ordered.      Follow-Up: At [facility], you and your health needs are our priority.  As part of our continuing mission to provide you with exceptional heart care, we have created designated Provider Care Teams.  These Care Teams include your primary Cardiologist (physician) and Advanced Practice Providers (APPs -  Physician Assistants and Nurse Practitioners) who all work together to provide you with the care you need, when you need it.  We recommend signing up for the patient portal called "MyChart".  Sign up information is provided on this After Visit Summary.  MyChart is used to connect with patients for Virtual Visits (Telemedicine).  Patients are able to view lab/test results, encounter notes, upcoming appointments, etc.  Non-urgent messages can be sent to your provider as well.   To learn more about what you can do with MyChart, go to [email or web address].    Your next appointment:   6 month(s)  The format for your next appointment:   In Person  Provider:   Virl Axe, MD

## 2020-06-04 LAB — CUP PACEART INCLINIC DEVICE CHECK
Brady Statistic RA Percent Paced: 36 %
Brady Statistic RV Percent Paced: 12 %
Date Time Interrogation Session: 20210921160300
Implantable Lead Implant Date: 19980115
Implantable Lead Implant Date: 19980115
Implantable Lead Location: 753859
Implantable Lead Location: 753860
Implantable Pulse Generator Implant Date: 20131007
Lead Channel Impedance Value: 331 Ohm
Lead Channel Impedance Value: 390 Ohm
Lead Channel Pacing Threshold Amplitude: 0.8 V
Lead Channel Pacing Threshold Amplitude: 0.8 V
Lead Channel Pacing Threshold Pulse Width: 0.4 ms
Lead Channel Pacing Threshold Pulse Width: 0.4 ms
Lead Channel Sensing Intrinsic Amplitude: 2.6 mV
Lead Channel Sensing Intrinsic Amplitude: 3.2 mV
Lead Channel Setting Pacing Amplitude: 2 V
Lead Channel Setting Pacing Amplitude: 2.5 V
Lead Channel Setting Pacing Pulse Width: 0.4 ms
Pulse Gen Serial Number: 66339555

## 2020-06-08 ENCOUNTER — Ambulatory Visit: Payer: Medicare Other | Attending: Internal Medicine

## 2020-06-08 DIAGNOSIS — Z23 Encounter for immunization: Secondary | ICD-10-CM

## 2020-06-08 NOTE — Progress Notes (Signed)
   Covid-19 Vaccination Clinic  Name:  FREDDI SCHRAGER    MRN: [id number] DOB: 01/08/1941  06/08/2020  Mr. Lovern was observed post Covid-19 immunization for 15 minutes without incident. He was provided with Vaccine Information Sheet and instruction to access the V-Safe system.   Mr. Ferron was instructed to call 911 with any severe reactions post vaccine: Marland Kitchen Difficulty breathing  . Swelling of face and throat  . A fast heartbeat  . A bad rash all over body  . Dizziness and weakness

## 2020-06-19 ENCOUNTER — Other Ambulatory Visit: Payer: Self-pay | Admitting: Internal Medicine

## 2020-06-21 NOTE — Telephone Encounter (Signed)
Pt last saw Dr Caryl Comes 06/01/20, last labs 07/08/19 Creat 1.2, age 79, weight 96.9kg, based on specified criteria pt is on appropriate dosage of Eliquis 5mg  BID.  Will refill rx.

## 2020-06-22 ENCOUNTER — Encounter: Payer: Self-pay | Admitting: Internal Medicine

## 2020-06-22 DIAGNOSIS — E785 Hyperlipidemia, unspecified: Secondary | ICD-10-CM

## 2020-06-22 DIAGNOSIS — E538 Deficiency of other specified B group vitamins: Secondary | ICD-10-CM

## 2020-06-22 DIAGNOSIS — E559 Vitamin D deficiency, unspecified: Secondary | ICD-10-CM

## 2020-06-22 DIAGNOSIS — R739 Hyperglycemia, unspecified: Secondary | ICD-10-CM

## 2020-06-23 ENCOUNTER — Other Ambulatory Visit (INDEPENDENT_AMBULATORY_CARE_PROVIDER_SITE_OTHER): Payer: Medicare Other

## 2020-06-23 ENCOUNTER — Other Ambulatory Visit: Payer: Self-pay

## 2020-06-23 DIAGNOSIS — E559 Vitamin D deficiency, unspecified: Secondary | ICD-10-CM | POA: Diagnosis not present

## 2020-06-23 DIAGNOSIS — E785 Hyperlipidemia, unspecified: Secondary | ICD-10-CM

## 2020-06-23 DIAGNOSIS — R739 Hyperglycemia, unspecified: Secondary | ICD-10-CM

## 2020-06-23 DIAGNOSIS — E538 Deficiency of other specified B group vitamins: Secondary | ICD-10-CM | POA: Diagnosis not present

## 2020-06-23 LAB — URINALYSIS, ROUTINE W REFLEX MICROSCOPIC
Bilirubin Urine: NEGATIVE
Hgb urine dipstick: NEGATIVE
Leukocytes,Ua: NEGATIVE
Nitrite: NEGATIVE
Specific Gravity, Urine: >=1.03 — AB (ref 1.000–1.030)
Total Protein, Urine: NEGATIVE
Urine Glucose: NEGATIVE
Urobilinogen, UA: 1 (ref 0.0–1.0)
pH: 5.5 (ref 5.0–8.0)

## 2020-06-23 LAB — CBC WITH DIFFERENTIAL/PLATELET
Basophils Absolute: 0 10*3/uL (ref 0.0–0.1)
Basophils Relative: 0.9 % (ref 0.0–3.0)
Eosinophils Absolute: 0.2 10*3/uL (ref 0.0–0.7)
Eosinophils Relative: 5.6 % — ABNORMAL HIGH (ref 0.0–5.0)
HCT: 41.8 % (ref 39.0–52.0)
Hemoglobin: 14 g/dL (ref 13.0–17.0)
Lymphocytes Relative: 24.7 % (ref 12.0–46.0)
Lymphs Abs: 1.1 10*3/uL (ref 0.7–4.0)
MCHC: 33.6 g/dL (ref 30.0–36.0)
MCV: 99.4 fl (ref 78.0–100.0)
Monocytes Absolute: 0.4 10*3/uL (ref 0.1–1.0)
Monocytes Relative: 9.8 % (ref 3.0–12.0)
Neutro Abs: 2.6 10*3/uL (ref 1.4–7.7)
Neutrophils Relative %: 59 % (ref 43.0–77.0)
Platelets: 138 10*3/uL — ABNORMAL LOW (ref 150.0–400.0)
RBC: 4.2 Mil/uL — ABNORMAL LOW (ref 4.22–5.81)
RDW: 15.1 % (ref 11.5–15.5)
WBC: 4.5 10*3/uL (ref 4.0–10.5)

## 2020-06-23 LAB — HEMOGLOBIN A1C: Hgb A1c MFr Bld: 5.8 % (ref 4.6–6.5)

## 2020-06-23 LAB — VITAMIN B12: Vitamin B-12: 417 pg/mL (ref 211–911)

## 2020-06-23 LAB — HEPATIC FUNCTION PANEL
ALT: 18 U/L (ref 0–53)
AST: 23 U/L (ref 0–37)
Albumin: 4.1 g/dL (ref 3.5–5.2)
Alkaline Phosphatase: 35 U/L — ABNORMAL LOW (ref 39–117)
Bilirubin, Direct: 0.2 mg/dL (ref 0.0–0.3)
Total Bilirubin: 0.8 mg/dL (ref 0.2–1.2)
Total Protein: 6.7 g/dL (ref 6.0–8.3)

## 2020-06-23 LAB — LIPID PANEL
Cholesterol: 97 mg/dL (ref 0–200)
HDL: 38.4 mg/dL — ABNORMAL LOW (ref >39.00)
LDL Cholesterol: 46 mg/dL (ref 0–99)
NonHDL: 58.86
Total CHOL/HDL Ratio: 3
Triglycerides: 65 mg/dL (ref 0.0–149.0)
VLDL: 13 mg/dL (ref 0.0–40.0)

## 2020-06-23 LAB — BASIC METABOLIC PANEL
BUN: 25 mg/dL — ABNORMAL HIGH (ref 6–23)
CO2: 29 mEq/L (ref 19–32)
Calcium: 10 mg/dL (ref 8.4–10.5)
Chloride: 107 mEq/L (ref 96–112)
Creatinine, Ser: 1.44 mg/dL (ref 0.40–1.50)
GFR: 45.29 mL/min — ABNORMAL LOW (ref >60.00)
Glucose, Bld: 132 mg/dL — ABNORMAL HIGH (ref 70–99)
Potassium: 4.3 mEq/L (ref 3.5–5.1)
Sodium: 141 mEq/L (ref 135–145)

## 2020-06-23 LAB — VITAMIN D 25 HYDROXY (VIT D DEFICIENCY, FRACTURES): VITD: 60.09 ng/mL (ref 30.00–100.00)

## 2020-06-23 LAB — TSH: TSH: 3.38 u[IU]/mL (ref 0.35–4.50)

## 2020-06-25 ENCOUNTER — Other Ambulatory Visit: Payer: Self-pay

## 2020-06-25 ENCOUNTER — Encounter: Payer: Self-pay | Admitting: Internal Medicine

## 2020-06-25 ENCOUNTER — Ambulatory Visit (INDEPENDENT_AMBULATORY_CARE_PROVIDER_SITE_OTHER): Payer: Medicare Other | Admitting: Internal Medicine

## 2020-06-25 VITALS — BP 110/66 | HR 63 | Temp 97.7°F | Ht 76.0 in | Wt 212.4 lb

## 2020-06-25 DIAGNOSIS — Z0001 Encounter for general adult medical examination with abnormal findings: Secondary | ICD-10-CM

## 2020-06-25 DIAGNOSIS — Z23 Encounter for immunization: Secondary | ICD-10-CM

## 2020-06-25 DIAGNOSIS — M7021 Olecranon bursitis, right elbow: Secondary | ICD-10-CM

## 2020-06-25 DIAGNOSIS — R42 Dizziness and giddiness: Secondary | ICD-10-CM | POA: Diagnosis not present

## 2020-06-25 DIAGNOSIS — H6982 Other specified disorders of Eustachian tube, left ear: Secondary | ICD-10-CM | POA: Diagnosis not present

## 2020-06-25 DIAGNOSIS — H6992 Unspecified Eustachian tube disorder, left ear: Secondary | ICD-10-CM

## 2020-06-25 DIAGNOSIS — J309 Allergic rhinitis, unspecified: Secondary | ICD-10-CM | POA: Diagnosis not present

## 2020-06-25 DIAGNOSIS — R739 Hyperglycemia, unspecified: Secondary | ICD-10-CM

## 2020-06-25 DIAGNOSIS — N1831 Chronic kidney disease, stage 3a: Secondary | ICD-10-CM

## 2020-06-25 NOTE — Patient Instructions (Addendum)
You had the Tdap tetanus and flu shots today  Please take all new medication as prescribed - the meclizine as needed (we will send when the computer function is back)  Please also take mucinex and continue your allergy medications as well  Please continue all other medications as before, and refills have been done if requested.  Please have the pharmacy call with any other refills you may need.  Please continue your efforts at being more active, low cholesterol diet, and weight control.  You are otherwise up to date with prevention measures today.  Please keep your appointments with your specialists as you may have planned  Please make an Appointment to return in 6 months for LAB only on the first floor - for kidney lab testing  Please make an Appointment to return for your 1 year visit, or sooner if needed

## 2020-06-25 NOTE — Progress Notes (Signed)
Subjective:    Patient ID: [name], male    DOB: December 06, 1940, 79 y.o.   MRN: [id number]  HPI  Here for wellness and f/u;  Overall doing ok;  Pt denies Chest pain, worsening SOB, DOE, wheezing, orthopnea, PND, worsening LE edema, palpitations, or syncope, except has had recurring vertigo with worsening eustachian symptoms on the left and Does have several wks ongoing nasal allergy symptoms with clearish congestion, itch and sneezing, without fever, pain, ST, cough, swelling or wheezing..  Pt denies neurological change such as new headache, facial or extremity weakness.  Pt denies polydipsia, polyuria, or low sugar symptoms. Pt states overall good compliance with treatment and medications, good tolerability, and has been trying to follow appropriate diet.  Pt denies worsening depressive symptoms, suicidal ideation or panic. No fever, night sweats, wt loss, loss of appetite, or other constitutional symptoms.  Pt states good ability with ADL's, has low fall risk, home safety reviewed and adequate, no other significant changes in hearing or vision, and only occasionally active with exercise.  Also baged the right elbow with a swelling painful at the tip of the elbow x 1 mo, now better but still some swelling persists.   Past Medical History:  Diagnosis Date   Allergic rhinitis    takes Allegra daily   Allergic rhinitis, cause unspecified 06/22/2012   Arthritis    Atrial fibrillation ([place])    takes Tikosyn and Eliquis daily   Bilateral popliteal artery aneurysm ([facility])    Coronary artery disease    LAD stenting 2004 non-DES   Depression    took Zoloft 31yrs ago but nothing now   Diverticulosis    DVT (deep venous thrombosis) ([facility])    Dysphagia    occasionally   History of blood clots 2003   left leg prior to fem pop   History of colon polyps    Hyperlipidemia    takes Tricor and Lipitor daily   Insomnia    takes Ambien nightly as needed   Joint pain    Joint swelling      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   OSA (obstructive sleep apnea)    CPAP   Pacemaker    Peripheral vascular disease ([place])    Pleurisy    early 80's   Urinary urgency    Past Surgical History:  Procedure Laterality Date   CARDIAC CATHETERIZATION  2004   CARDIAC CATHETERIZATION N/A 05/06/2015   Procedure: Left Heart Cath and Coronary Angiography;  Surgeon: Belva Crome, MD;  Location: [facility];  Service: Cardiovascular;  Laterality: N/A;   COLONOSCOPY     hand and arm surgery Right    as a teenager   HAND SURGERY Left    INGUINAL HERNIA REPAIR Right    JOINT REPLACEMENT Left December 09, 2013   Left Hip    JOINT REPLACEMENT Right Aug. 25, 2015   Right Hip   left knee surgery     PACEMAKER INSERTION     due to bradycardia   PERMANENT PACEMAKER GENERATOR CHANGE N/A 06/17/2012   Procedure: PERMANENT PACEMAKER GENERATOR CHANGE;  Surgeon: Deboraha Sprang, MD;  [facility]: [facility] CATH LAB;  Service: Cardiovascular;  Laterality: N/A;   right and left fem-pop bypass     stent (unspec)     x2   TOTAL HIP ARTHROPLASTY Left 12/09/2013   Procedure: TOTAL HIP ARTHROPLASTY ANTERIOR APPROACH;  Surgeon: Hessie Dibble, MD;  Location: [facility];  Service: Orthopedics;  Laterality: Left;  left anterior total hip arthroplasty   TOTAL HIP ARTHROPLASTY Right 05/05/2014   Procedure: TOTAL HIP ARTHROPLASTY ANTERIOR APPROACH;  Surgeon: Hessie Dibble, MD;  Location: [facility];  Service: Orthopedics;  Laterality: Right;   TURBINATE REDUCTION     wisdom extracted       reports that he has never smoked. He has never used smokeless tobacco. He reports that he does not drink alcohol and does not use drugs. family history includes Aneurysm in his father; Diabetes in his mother and another family member; Heart attack in his father; Heart disease in his father and mother; Peripheral vascular disease in his father; Sleep apnea in an other family member; Stroke in his  father. Allergies  Allergen Reactions   Amiodarone     REACTION: peripheral neuropathy   Niacin     REACTION: irregular heartbeat   Ace Inhibitors     REACTION: cough   Mometasone Furoate Other (See Comments)    (Nasonex) Causes nasal bleeding and nose bleeds   Current Outpatient Medications on File Prior to Visit  Medication Sig Dispense Refill   atorvastatin (LIPITOR) 80 MG tablet TAKE 1 TABLET BY MOUTH  DAILY 90 tablet 3   CALCIUM PO Take 600 mg by mouth 2 (two) times daily.     carvedilol (COREG) 25 MG tablet TAKE 2 TABLETS BY MOUTH  TWICE DAILY 360 tablet 3   Cholecalciferol (D3 VITAMIN PO) Take 4,000 Int'l Units by mouth daily.     Coenzyme Q10 (COQ-10 PO) Take 300 mg by mouth daily.     Cyanocobalamin (VITAMIN B 12 PO) Take 1 tablet by mouth daily.     dofetilide (TIKOSYN) 500 MCG capsule TAKE 1 CAPSULE BY MOUTH  TWICE DAILY 180 capsule 3   ELIQUIS 5 MG TABS tablet TAKE 1 TABLET BY MOUTH  TWICE DAILY 180 tablet 1   fenofibrate 160 MG tablet TAKE 1 TABLET BY MOUTH  DAILY 90 tablet 3   fexofenadine (ALLEGRA) 180 MG tablet Take 180 mg by mouth daily.     Fluocinonide Emulsified Base 0.05 % CREA APPLY TO AFFECTED AREA(S)  TWO TIMES DAILY AS NEEDED 60 g 2   FOLIC ACID PO Take 1 tablet by mouth daily.     GLUCOSAMINE CHONDROITIN COMPLX PO Take 1 tablet by mouth 2 (two) times daily.      losartan (COZAAR) 25 MG tablet TAKE 1 TABLET BY MOUTH  DAILY 90 tablet 3   Lysine 500 MG TABS Take 500 mg by mouth 3 (three) times daily.     Magnesium 250 MG TABS Take 250 mg by mouth 2 (two) times daily.      Omega-3 Fatty Acids (FISH OIL) 1200 MG CAPS Take 1,200 mg by mouth daily.      potassium chloride (KLOR-CON) 10 MEQ tablet TAKE 1 TABLET BY MOUTH  TWICE DAILY 180 tablet 3   ranitidine (ZANTAC) 300 MG tablet Take 1 tablet (300 mg total) by mouth at bedtime. 90 tablet 2   ranolazine (RANEXA) 500 MG 12 hr tablet Take 1 tablet (500 mg total) by mouth 2 (two) times daily.  180 tablet 3   triamcinolone (NASACORT) 55 MCG/ACT AERO nasal inhaler Place 2 sprays into both nostrils daily.      zolpidem (AMBIEN) 10 MG tablet Take 0.5 tablets (5 mg total) by mouth at bedtime as needed for sleep. 90 tablet 1   No current facility-administered medications on file prior to visit.   Review of  Systems All otherwise neg per pt    Objective:   Physical Exam BP 110/66 (BP Location: Left Arm, Patient Position: Sitting, Cuff Size: Normal)    Pulse 63    Temp 97.7 F (36.5 C) (Oral)    Ht 6\' 4"  (1.93 m)    Wt 212 lb 6.4 oz (96.3 kg)    SpO2 98%    BMI 25.85 kg/m  VS noted,  Constitutional: Pt appears in NAD HENT: Head: NCAT.  Right Ear: External ear normal.  Left Ear: External ear normal. left tm with  Mild erythema and post effusion Eyes: . Pupils are equal, round, and reactive to light. Conjunctivae and EOM are normal Nose: without d/c or deformity Neck: Neck supple. Gross normal ROM Cardiovascular: Normal rate and regular rhythm.   Pulmonary/Chest: Effort normal and breath sounds without rales or wheezing.  Abd:  Soft, NT, ND, + BS, no organomegaly Right elbow olecranon with 1= swelling minor tender no redness Neurological: Pt is alert. At baseline orientation, motor grossly intact Skin: Skin is warm. No rashes, other new lesions, no LE edema Psychiatric: Pt behavior is normal without agitation  All otherwise neg per pt Lab Results  Component Value Date   WBC 4.5 06/23/2020   HGB 14.0 06/23/2020   HCT 41.8 06/23/2020   PLT 138.0 (L) 06/23/2020   GLUCOSE 132 (H) 06/23/2020   CHOL 97 06/23/2020   TRIG 65.0 06/23/2020   HDL 38.40 (L) 06/23/2020   LDLCALC 46 06/23/2020   ALT 18 06/23/2020   AST 23 06/23/2020   NA 141 06/23/2020   K 4.3 06/23/2020   CL 107 06/23/2020   CREATININE 1.44 06/23/2020   BUN 25 (H) 06/23/2020   CO2 29 06/23/2020   TSH 3.38 06/23/2020   PSA 0.38 04/19/2016   INR 1.3 (H) 05/03/2015   HGBA1C 5.8 06/23/2020           Assessment & Plan:

## 2020-06-27 ENCOUNTER — Encounter: Payer: Self-pay | Admitting: Internal Medicine

## 2020-06-27 DIAGNOSIS — R42 Dizziness and giddiness: Secondary | ICD-10-CM | POA: Insufficient documentation

## 2020-06-27 DIAGNOSIS — H6980 Other specified disorders of Eustachian tube, unspecified ear: Secondary | ICD-10-CM | POA: Insufficient documentation

## 2020-06-27 DIAGNOSIS — H6990 Unspecified Eustachian tube disorder, unspecified ear: Secondary | ICD-10-CM | POA: Insufficient documentation

## 2020-06-27 DIAGNOSIS — N183 Chronic kidney disease, stage 3 unspecified: Secondary | ICD-10-CM | POA: Insufficient documentation

## 2020-06-27 DIAGNOSIS — M7021 Olecranon bursitis, right elbow: Secondary | ICD-10-CM | POA: Insufficient documentation

## 2020-06-27 NOTE — Assessment & Plan Note (Signed)
stable overall by history and exam, recent data reviewed with pt, and pt to continue medical treatment as before,  to f/u any worsening symptoms or concerns, for f/u lab in 6 mo

## 2020-06-27 NOTE — Assessment & Plan Note (Signed)
Mild, improving, no specific tx needed

## 2020-06-27 NOTE — Assessment & Plan Note (Signed)
For increased antihistamine and nasal steroid use

## 2020-06-27 NOTE — Assessment & Plan Note (Addendum)
For meclizine prn,  to f/u any worsening symptoms or concerns  I spent 41 minutes in addition to time for CPX wellness examination in preparing to see the patient by review of recent labs, imaging and procedures, obtaining and reviewing separately obtained history, communicating with the patient and family or caregiver, ordering medications, tests or procedures, and documenting clinical information in the EHR including the differential Dx, treatment, and any further evaluation and other management of vertigo, allergies, eustachian tube dysfxn, ckd, hyperglycemia, right olecranon bursitis

## 2020-06-27 NOTE — Assessment & Plan Note (Signed)
stable overall by history and exam, recent data reviewed with pt, and pt to continue medical treatment as before,  to f/u any worsening symptoms or concerns  

## 2020-06-27 NOTE — Assessment & Plan Note (Signed)

## 2020-06-27 NOTE — Assessment & Plan Note (Signed)
For mucinex bid prn otc

## 2020-06-27 NOTE — Addendum Note (Signed)
Addended by: Biagio Borg on: 06/27/2020 02:09 PM   Modules accepted: Orders

## 2020-07-05 ENCOUNTER — Encounter: Payer: Self-pay | Admitting: Internal Medicine

## 2020-07-05 MED ORDER — MECLIZINE HCL 12.5 MG PO TABS: 12.5000 mg | ORAL_TABLET | Freq: Three times a day (TID) | ORAL | 1 refills | Status: DC | PRN

## 2020-07-06 ENCOUNTER — Telehealth: Payer: Self-pay

## 2020-07-06 ENCOUNTER — Ambulatory Visit: Payer: Medicare Other

## 2020-07-06 NOTE — Telephone Encounter (Signed)
Phone AWV was cancelled and not rescheduled.

## 2020-09-22 DIAGNOSIS — I5022 Chronic systolic (congestive) heart failure: Secondary | ICD-10-CM

## 2020-09-22 DIAGNOSIS — I480 Paroxysmal atrial fibrillation: Secondary | ICD-10-CM

## 2020-09-22 DIAGNOSIS — I428 Other cardiomyopathies: Secondary | ICD-10-CM

## 2020-09-22 DIAGNOSIS — I4891 Unspecified atrial fibrillation: Secondary | ICD-10-CM

## 2020-09-23 ENCOUNTER — Other Ambulatory Visit: Payer: Self-pay | Admitting: Internal Medicine

## 2020-09-23 DIAGNOSIS — I4891 Unspecified atrial fibrillation: Secondary | ICD-10-CM

## 2020-09-23 NOTE — Telephone Encounter (Signed)
Eliquis mg refill request received. Patient is 80 years old, weight-96.3kg, Crea-1.4 on 06/23/2020, Diagnosis-Afib, and last seen by Dr. Caryl Comes on 9/21/221. Dose is appropriate based on dosing criteria. Will send in refill to requested pharmacy.

## 2020-09-29 ENCOUNTER — Ambulatory Visit: Payer: Medicare Other

## 2020-10-11 ENCOUNTER — Other Ambulatory Visit: Payer: Self-pay

## 2020-10-11 ENCOUNTER — Ambulatory Visit (INDEPENDENT_AMBULATORY_CARE_PROVIDER_SITE_OTHER): Payer: Medicare Other

## 2020-10-11 VITALS — BP 132/60 | HR 64 | Temp 97.9°F | Ht 76.0 in | Wt 218.4 lb

## 2020-10-11 DIAGNOSIS — Z0000 Encounter for general adult medical examination without abnormal findings: Secondary | ICD-10-CM

## 2020-10-11 NOTE — Patient Instructions (Addendum)
[name] , Thank you for taking time to come for your Medicare Wellness Visit. I appreciate your ongoing commitment to your health goals. Please review the following plan we discussed and let me know if I can assist you in the future.   Screening recommendations/referrals: Colonoscopy: last done 04/06/2015; no repeat due to age Recommended yearly ophthalmology/optometry visit for glaucoma screening and checkup Recommended yearly dental visit for hygiene and checkup  Vaccinations: Influenza vaccine: 06/25/2020 Pneumococcal vaccine: up to date Tdap vaccine: 06/25/2020; due every 10 years Shingles vaccine: 02/02/2017 Covid-19: up to date  Advanced directives: Please bring a copy of your health care power of attorney and living will to the office at your convenience.  Conditions/risks identified: Yes; Reviewed health maintenance screenings with patient today and relevant education, vaccines, and/or referrals were provided. Please continue to do your personal lifestyle choices by: daily care of teeth and gums, regular physical activity (goal should be 5 days a week for 30 minutes), eat a healthy diet, avoid tobacco and drug use, limiting any alcohol intake, taking a low-dose aspirin (if not allergic or have been advised by your provider otherwise) and taking vitamins and minerals as recommended by your provider. Continue doing brain stimulating activities (puzzles, reading, adult coloring books, staying active) to keep memory sharp. Continue to eat heart healthy diet (full of fruits, vegetables, whole grains, lean protein, water--limit salt, fat, and sugar intake) and increase physical activity as tolerated.  Next appointment: Please schedule your next Medicare Wellness Visit with your Nurse Health Advisor in 1 year by calling [phone].  Preventive Care 45 Years and Older, Male Preventive care refers to lifestyle choices and visits with your health care provider that can promote health and  wellness. What does preventive care include?  A yearly physical exam. This is also called an annual well check.  Dental exams once or twice a year.  Routine eye exams. Ask your health care provider how often you should have your eyes checked.  Personal lifestyle choices, including:  Daily care of your teeth and gums.  Regular physical activity.  Eating a healthy diet.  Avoiding tobacco and drug use.  Limiting alcohol use.  Practicing safe sex.  Taking low doses of aspirin every day.  Taking vitamin and mineral supplements as recommended by your health care provider. What happens during an annual well check? The services and screenings done by your health care provider during your annual well check will depend on your age, overall health, lifestyle risk factors, and family history of disease. Counseling  Your health care provider may ask you questions about your:  Alcohol use.  Tobacco use.  Drug use.  Emotional well-being.  Home and relationship well-being.  Sexual activity.  Eating habits.  History of falls.  Memory and ability to understand (cognition).  Work and work Statistician. Screening  You may have the following tests or measurements:  Height, weight, and BMI.  Blood pressure.  Lipid and cholesterol levels. These may be checked every 5 years, or more frequently if you are over 57 years old.  Skin check.  Lung cancer screening. You may have this screening every year starting at age 62 if you have a 30-pack-year history of smoking and currently smoke or have quit within the past 15 years.  Fecal occult blood test (FOBT) of the stool. You may have this test every year starting at age 53.  Flexible sigmoidoscopy or colonoscopy. You may have a sigmoidoscopy every 5 years or a colonoscopy every 10 years  starting at age 107.  Prostate cancer screening. Recommendations will vary depending on your family history and other risks.  Hepatitis C blood  test.  Hepatitis B blood test.  Sexually transmitted disease (STD) testing.  Diabetes screening. This is done by checking your blood sugar (glucose) after you have not eaten for a while (fasting). You may have this done every 1-3 years.  Abdominal aortic aneurysm (AAA) screening. You may need this if you are a current or former smoker.  Osteoporosis. You may be screened starting at age 5 if you are at high risk. Talk with your health care provider about your test results, treatment options, and if necessary, the need for more tests. Vaccines  Your health care provider may recommend certain vaccines, such as:  Influenza vaccine. This is recommended every year.  Tetanus, diphtheria, and acellular pertussis (Tdap, Td) vaccine. You may need a Td booster every 10 years.  Zoster vaccine. You may need this after age 70.  Pneumococcal 13-valent conjugate (PCV13) vaccine. One dose is recommended after age 81.  Pneumococcal polysaccharide (PPSV23) vaccine. One dose is recommended after age 72. Talk to your health care provider about which screenings and vaccines you need and how often you need them. This information is not intended to replace advice given to you by your health care provider. Make sure you discuss any questions you have with your health care provider. Document Released: 09/24/2015 Document Revised: 05/17/2016 Document Reviewed: 06/29/2015 Elsevier Interactive Patient Education  2017 [place] Prevention in the Home Falls can cause injuries. They can happen to people of all ages. There are many things you can do to make your home safe and to help prevent falls. What can I do on the outside of my home?  Regularly fix the edges of walkways and driveways and fix any cracks.  Remove anything that might make you trip as you walk through a door, such as a raised step or threshold.  Trim any bushes or trees on the path to your home.  Use bright outdoor  lighting.  Clear any walking paths of anything that might make someone trip, such as rocks or tools.  Regularly check to see if handrails are loose or broken. Make sure that both sides of any steps have handrails.  Any raised decks and porches should have guardrails on the edges.  Have any leaves, snow, or ice cleared regularly.  Use sand or salt on walking paths during winter.  Clean up any spills in your garage right away. This includes oil or grease spills. What can I do in the bathroom?  Use night lights.  Install grab bars by the toilet and in the tub and shower. Do not use towel bars as grab bars.  Use non-skid mats or decals in the tub or shower.  If you need to sit down in the shower, use a plastic, non-slip stool.  Keep the floor dry. Clean up any water that spills on the floor as soon as it happens.  Remove soap buildup in the tub or shower regularly.  Attach bath mats securely with double-sided non-slip rug tape.  Do not have throw rugs and other things on the floor that can make you trip. What can I do in the bedroom?  Use night lights.  Make sure that you have a light by your bed that is easy to reach.  Do not use any sheets or blankets that are too big for your bed. They should not hang down  onto the floor.  Have a firm chair that has side arms. You can use this for support while you get dressed.  Do not have throw rugs and other things on the floor that can make you trip. What can I do in the kitchen?  Clean up any spills right away.  Avoid walking on wet floors.  Keep items that you use a lot in easy-to-reach places.  If you need to reach something above you, use a strong step stool that has a grab bar.  Keep electrical cords out of the way.  Do not use floor polish or wax that makes floors slippery. If you must use wax, use non-skid floor wax.  Do not have throw rugs and other things on the floor that can make you trip. What can I do with my  stairs?  Do not leave any items on the stairs.  Make sure that there are handrails on both sides of the stairs and use them. Fix handrails that are broken or loose. Make sure that handrails are as long as the stairways.  Check any carpeting to make sure that it is firmly attached to the stairs. Fix any carpet that is loose or worn.  Avoid having throw rugs at the top or bottom of the stairs. If you do have throw rugs, attach them to the floor with carpet tape.  Make sure that you have a light switch at the top of the stairs and the bottom of the stairs. If you do not have them, ask someone to add them for you. What else can I do to help prevent falls?  Wear shoes that:  Do not have high heels.  Have rubber bottoms.  Are comfortable and fit you well.  Are closed at the toe. Do not wear sandals.  If you use a stepladder:  Make sure that it is fully opened. Do not climb a closed stepladder.  Make sure that both sides of the stepladder are locked into place.  Ask someone to hold it for you, if possible.  Clearly mark and make sure that you can see:  Any grab bars or handrails.  First and last steps.  Where the edge of each step is.  Use tools that help you move around (mobility aids) if they are needed. These include:  Canes.  Walkers.  Scooters.  Crutches.  Turn on the lights when you go into a dark area. Replace any light bulbs as soon as they burn out.  Set up your furniture so you have a clear path. Avoid moving your furniture around.  If any of your floors are uneven, fix them.  If there are any pets around you, be aware of where they are.  Review your medicines with your doctor. Some medicines can make you feel dizzy. This can increase your chance of falling. Ask your doctor what other things that you can do to help prevent falls. This information is not intended to replace advice given to you by your health care provider. Make sure you discuss any  questions you have with your health care provider. Document Released: 06/24/2009 Document Revised: 02/03/2016 Document Reviewed: 10/02/2014 Elsevier Interactive Patient Education  2017 [employer].

## 2020-10-11 NOTE — Progress Notes (Signed)
Subjective:   [name] is a 80 y.o. male who presents for Medicare Annual/Subsequent preventive examination.  Review of Systems    No ROS. Medicare Wellness Visit. Additional risk factors are reflected in social history. Cardiac Risk Factors include: advanced age (>17men, >29 women);dyslipidemia;family history of premature cardiovascular disease;male gender     Objective:    Today's Vitals   10/11/20 1315  BP: 132/60  Pulse: 64  Temp: 97.9 F (36.6 C)  SpO2: 97%  Weight: 218 lb 6.4 oz (99.1 kg)  Height: 6\' 4"  (1.93 m)  PainSc: 0-No pain   Body mass index is 26.58 kg/m.  Advanced Directives 10/11/2020 07/13/2017 07/05/2016 05/06/2015 04/06/2015 05/05/2014 05/05/2014  Does Patient Have a Medical Advance Directive? Yes Yes Yes Yes Yes Yes Yes  Type of Advance Directive Living will;Healthcare Power of [place];Living will [place];Living will [place];Living will - Healthcare Power of [place]  Does patient want to make changes to medical advance directive? No - Patient declined - - No - Patient declined - - No - Patient declined  Copy of Secretary in Chart? No - copy requested - Yes No - copy requested No - copy requested - No - copy requested  Pre-existing out of facility DNR order (yellow form or pink MOST form) - - - - - - -    Current Medications (verified) Outpatient Encounter Medications as of 10/11/2020  Medication Sig  . atorvastatin (LIPITOR) 80 MG tablet TAKE 1 TABLET BY MOUTH  DAILY  . CALCIUM PO Take 600 mg by mouth 2 (two) times daily.  . carvedilol (COREG) 25 MG tablet TAKE 2 TABLETS BY MOUTH  TWICE DAILY  . Cholecalciferol (D3 VITAMIN PO) Take 4,000 Int'l Units by mouth daily.  . Coenzyme Q10 (COQ-10 PO) Take 300 mg by mouth daily.  . Cyanocobalamin (VITAMIN B 12 PO) Take 1 tablet by mouth daily.  Marland Kitchen dofetilide (TIKOSYN) 500 MCG capsule TAKE 1  CAPSULE BY MOUTH  TWICE DAILY  . ELIQUIS 5 MG TABS tablet TAKE 1 TABLET BY MOUTH  TWICE DAILY  . fenofibrate 160 MG tablet TAKE 1 TABLET BY MOUTH  DAILY  . fexofenadine (ALLEGRA) 180 MG tablet Take 180 mg by mouth daily.  . Fluocinonide Emulsified Base 0.05 % CREA APPLY TO AFFECTED AREA(S)  TWO TIMES DAILY AS NEEDED  . FOLIC ACID PO Take 1 tablet by mouth daily.  Marland Kitchen GLUCOSAMINE CHONDROITIN COMPLX PO Take 1 tablet by mouth 2 (two) times daily.   Marland Kitchen losartan (COZAAR) 25 MG tablet TAKE 1 TABLET BY MOUTH  DAILY  . Lysine 500 MG TABS Take 500 mg by mouth 3 (three) times daily.  . Magnesium 250 MG TABS Take 250 mg by mouth 2 (two) times daily.   . meclizine (ANTIVERT) 12.5 MG tablet Take 1 tablet (12.5 mg total) by mouth 3 (three) times daily as needed for dizziness.  . Omega-3 Fatty Acids (FISH OIL) 1200 MG CAPS Take 1,200 mg by mouth daily.   . potassium chloride (KLOR-CON) 10 MEQ tablet TAKE 1 TABLET BY MOUTH  TWICE DAILY  . ranitidine (ZANTAC) 300 MG tablet Take 1 tablet (300 mg total) by mouth at bedtime.  . ranolazine (RANEXA) 500 MG 12 hr tablet Take 1 tablet (500 mg total) by mouth 2 (two) times daily.  Marland Kitchen triamcinolone (NASACORT) 55 MCG/ACT AERO nasal inhaler Place 2 sprays into both nostrils daily.    No  facility-administered encounter medications on file as of 10/11/2020.    Allergies (verified) Amiodarone, Niacin, Ace inhibitors, and Mometasone furoate   History: Past Medical History:  Diagnosis Date  . Allergic rhinitis    takes Allegra daily  . Allergic rhinitis, cause unspecified 06/22/2012  . Arthritis   . Atrial fibrillation ([place])    takes Tikosyn and Eliquis daily  . 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 Coronary artery disease    LAD stenting 2004 non-DES  . Depression    took Zoloft 51yrs ago but nothing now  . Diverticulosis   . DVT (deep venous thrombosis) ([place])   . Dysphagia    occasionally  . History of blood clots 2003   left leg prior to fem pop  .  History of colon polyps   . Hyperlipidemia    takes Tricor and Lipitor daily  . Insomnia    takes Ambien nightly as needed  . Joint pain   . Joint swelling   . 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 OSA (obstructive sleep apnea)    CPAP  . Pacemaker   . Peripheral vascular disease ([place])   . Pleurisy    early 56's  . Urinary urgency    Past Surgical History:  Procedure Laterality Date  . CARDIAC CATHETERIZATION  2004  . CARDIAC CATHETERIZATION N/A 05/06/2015   Procedure: Left Heart Cath and Coronary Angiography;  Surgeon: Belva Crome, MD;  Location: [facility];  Service: Cardiovascular;  Laterality: N/A;  . COLONOSCOPY    . hand and arm surgery Right    as a teenager  . HAND SURGERY Left   . INGUINAL HERNIA REPAIR Right   . JOINT REPLACEMENT Left December 09, 2013   Left Hip   . JOINT REPLACEMENT Right Aug. 25, 2015   Right Hip  . left knee surgery    . PACEMAKER INSERTION     due to bradycardia  . PERMANENT PACEMAKER GENERATOR CHANGE N/A 06/17/2012   Procedure: PERMANENT PACEMAKER GENERATOR CHANGE;  Surgeon: Deboraha Sprang, MD;  Location[facility];  Service: Cardiovascular;  Laterality: N/A;  . right and left fem-pop bypass    . stent (unspec)     x2  . TOTAL HIP ARTHROPLASTY Left 12/09/2013   Procedure: TOTAL HIP ARTHROPLASTY ANTERIOR APPROACH;  Surgeon: Hessie Dibble, MD;  Location: [facility];  Service: Orthopedics;  Laterality: Left;  left anterior total hip arthroplasty  . TOTAL HIP ARTHROPLASTY Right 05/05/2014   Procedure: TOTAL HIP ARTHROPLASTY ANTERIOR APPROACH;  Surgeon: Hessie Dibble, MD;  Location: [facility];  Service: Orthopedics;  Laterality: Right;  . TURBINATE REDUCTION    . wisdom extracted      Family History  Problem Relation Age of Onset  . Diabetes Other        family hx  . Sleep apnea Other        family hx  . Diabetes Mother   . Heart disease Mother        Before age 36  . Heart disease Father        Before age 65  .  Heart attack Father   . Stroke Father   . Aneurysm Father        bilat pop art aneurysms  . Peripheral vascular disease Father        Popliteal Aneurysm  . Allergic rhinitis Neg Hx   . Angioedema Neg Hx   . Asthma  Neg Hx   . Eczema Neg Hx   . Immunodeficiency Neg Hx   . Urticaria Neg Hx    Social History   Socioeconomic History  . Marital status: Married    Spouse name: Not on file  . Number of children: Not on file  . Years of education: Not on file  . Highest education level: Not on file  Occupational History  . Not on file  Tobacco Use  . Smoking status: Never Smoker  . Smokeless tobacco: Never Used  Vaping Use  . Vaping Use: Never used  Substance and Sexual Activity  . Alcohol use: No    Alcohol/week: 0.0 standard drinks    Comment: nothing since 2000  . Drug use: No  . Sexual activity: Yes  Other Topics Concern  . 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    Social Determinants of Health   Financial Resource Strain: Low Risk   . Difficulty of Paying Living Expenses: Not hard at all  Food Insecurity: No Food Insecurity  . Worried About Charity fundraiser in the Last Year: Never true  . Ran Out of Food in the Last Year: Never true  Transportation Needs: No Transportation Needs  . Lack of Transportation (Medical): No  . Lack of Transportation (Non-Medical): No  Physical Activity: Sufficiently Active  . Days of Exercise per Week: 7 days  . Minutes of Exercise per Session: 60 min  Stress: No Stress Concern Present  . Feeling of Stress : Not at all  Social Connections: Socially Integrated  . Frequency of Communication with Friends and Family: More than three times a week  . Frequency of Social Gatherings with Friends and Family: More than three times a week  . Attends Religious Services: More than 4 times per year  . Active Member of Clubs or Organizations: Yes  . Attends Archivist Meetings: More than  4 times per year  . Marital Status: Married    Tobacco Counseling Counseling given: Not Answered   Clinical Intake:  Pre-visit preparation completed: Yes  Pain : No/denies pain Pain Score: 0-No pain     BMI - recorded: 26.58 Nutritional Status: BMI 25 -29 Overweight Nutritional Risks: None Diabetes: No  How often do you need to have someone help you when you read instructions, pamphlets, or other written materials from your doctor or pharmacy?: 1 - Never What is the last grade level you completed in school?: MBA Degree  Diabetic? no  Interpreter Needed?: No  Information entered by :: Lisette Abu, LPN   Activities of Daily Living In your present state of health, do you have any difficulty performing the following activities: 10/11/2020 06/25/2020  Hearing? Y N  Comment left ear -  Vision? N N  Difficulty concentrating or making decisions? N N  Walking or climbing stairs? N N  Dressing or bathing? N N  Doing errands, shopping? N N  Preparing Food and eating ? N -  Using the Toilet? N -  In the past six months, have you accidently leaked urine? N -  Do you have problems with loss of bowel control? N -  Managing your Medications? N -  Managing your Finances? N -  Housekeeping or managing your Housekeeping? N -  Some recent data might be hidden    Patient Care Team: Biagio Borg, MD as PCP - General (Internal Medicine) Deboraha Sprang, MD (Cardiology) Melrose Nakayama, MD  as Consulting Physician (Orthopedic Surgery) Biagio Borg, MD as Consulting Physician (Internal Medicine)  Indicate any recent Medical Services you may have received from other than [facility] providers in the past year (date may be approximate).     Assessment:   This is a routine wellness examination for Robertt.  Hearing/Vision screen No exam data present  Dietary issues and exercise activities discussed: Current Exercise Habits: The patient has a physically strenuous job, but has no  regular exercise apart from work., Exercise limited by: cardiac condition(s);orthopedic condition(s)  Goals    . Client understands the importance of follow-up with providers by attending scheduled visits     I would like to resume exercising at [facility].      Depression Screen PHQ 2/9 Scores 10/11/2020 06/25/2020 06/25/2020 04/07/2015  PHQ - 2 Score 0 0 0 0  PHQ- 9 Score - - 0 -    Fall Risk Fall Risk  10/11/2020 06/25/2020 06/25/2020 04/07/2015  Falls in the past year? 0 0 0 Yes  Number falls in past yr: 0 - 0 1  Injury with Fall? 0 - 0 No  Risk for fall due to : No Fall Risks - No Fall Risks -  Follow up Falls evaluation completed - Falls evaluation completed -    FALL RISK PREVENTION PERTAINING TO THE HOME:  Any stairs in or around the home? Yes  If so, are there any without handrails? No  Home free of loose throw rugs in walkways, pet beds, electrical cords, etc? Yes  Adequate lighting in your home to reduce risk of falls? Yes   ASSISTIVE DEVICES UTILIZED TO PREVENT FALLS:  Life alert? No  Use of a cane, walker or w/c? No  Grab bars in the bathroom? No  Shower chair or bench in shower? No  Elevated toilet seat or a handicapped toilet? Yes   TIMED UP AND GO:  Was the test performed? No .  Length of time to ambulate 10 feet: 0 sec.   Gait steady and fast without use of assistive device  Cognitive Function: Normal cognitive status assessed by direct observation by this Nurse Health Advisor. No abnormalities found.          Immunizations Immunization History  Administered Date(s) Administered  . Fluad Quad(high Dose 65+) 06/25/2020  . H1N1 08/11/2008  . Influenza Split 06/21/2012  . Influenza Whole 08/20/2008, 05/31/2010, 05/04/2013  . Influenza, High Dose Seasonal PF 06/15/2014, 06/29/2018, 06/15/2019  . Influenza-Unspecified 05/18/2015  . PFIZER(Purple Top)SARS-COV-2 Vaccination 09/21/2019, 10/11/2019, 06/08/2020  . Pneumococcal Conjugate-13 04/07/2015  .  Pneumococcal Polysaccharide-23 09/11/2005  . Td 02/02/2009  . Tdap 06/25/2020  . Zoster 03/05/2007  . Zoster Recombinat (Shingrix) 02/02/2017, 06/01/2017    TDAP status: Up to date  Flu Vaccine status: Up to date  Pneumococcal vaccine status: Up to date  Covid-19 vaccine status: Completed vaccines  Qualifies for Shingles Vaccine? Yes   Zostavax completed Yes   Shingrix Completed?: Yes  Screening Tests Health Maintenance  Topic Date Due  . COLONOSCOPY (Pts 45-46yrs Insurance coverage will need to be confirmed)  06/25/2021 (Originally 04/05/2020)  . TETANUS/TDAP  06/25/2030  . INFLUENZA VACCINE  Completed  . COVID-19 Vaccine  Completed  . PNA vac Low Risk Adult  Completed    Health Maintenance  There are no preventive care reminders to display for this patient.  Colorectal cancer screening: No longer required.   Lung Cancer Screening: (Low Dose CT Chest recommended if Age 24-80 years, 30 pack-year currently smoking OR have  quit w/in 15years.) does not qualify.   Lung Cancer Screening Referral: no  Additional Screening:  Hepatitis C Screening: does not qualify; Completed no  Vision Screening: Recommended annual ophthalmology exams for early detection of glaucoma and other disorders of the eye. Is the patient up to date with their annual eye exam?  Yes  Who is the provider or what is the name of the office in which the patient attends annual eye exams? Marica Otter, OD If pt is not established with a provider, would they like to be referred to a provider to establish care? No .   Dental Screening: Recommended annual dental exams for proper oral hygiene  Community Resource Referral / Chronic Care Management: CRR required this visit?  No   CCM required this visit?  No      Plan:     I have personally reviewed and noted the following in the patient's chart:   . Medical and social history . Use of alcohol, tobacco or illicit drugs  . Current medications and  supplements . Functional ability and status . Nutritional status . Physical activity . Advanced directives . List of other physicians . Hospitalizations, surgeries, and ER visits in previous 12 months . Vitals . Screenings to include cognitive, depression, and falls . Referrals and appointments  In addition, I have reviewed and discussed with patient certain preventive protocols, quality metrics, and best practice recommendations. A written personalized care plan for preventive services as well as general preventive health recommendations were provided to patient.     Sheral Flow, LPN   [id number]   Nurse Notes: n/a

## 2020-10-19 NOTE — Telephone Encounter (Signed)
P  would you do me a favor and give me some advice as to whether a stent jailing a diagonal would cause a false positive Myoview and is there Rosamaria Lints alternative imaging tool to help with this Thanks SK

## 2020-10-20 ENCOUNTER — Other Ambulatory Visit: Payer: Self-pay

## 2020-10-26 ENCOUNTER — Other Ambulatory Visit: Payer: Self-pay

## 2020-10-26 ENCOUNTER — Emergency Department (HOSPITAL_COMMUNITY): Payer: Medicare Other

## 2020-10-26 ENCOUNTER — Encounter (HOSPITAL_COMMUNITY): Payer: Self-pay

## 2020-10-26 ENCOUNTER — Emergency Department (HOSPITAL_COMMUNITY)
Admission: EM | Admit: 2020-10-26 | Discharge: 2020-10-27 | Disposition: A | Discharge status: Home / Self Care | Payer: Medicare Other | Attending: Emergency Medicine | Admitting: Emergency Medicine

## 2020-10-26 DIAGNOSIS — Z79899 Other long term (current) drug therapy: Secondary | ICD-10-CM | POA: Insufficient documentation

## 2020-10-26 DIAGNOSIS — M79641 Pain in right hand: Secondary | ICD-10-CM | POA: Diagnosis not present

## 2020-10-26 DIAGNOSIS — S0081XA Abrasion of other part of head, initial encounter: Secondary | ICD-10-CM | POA: Diagnosis not present

## 2020-10-26 DIAGNOSIS — S0992XA Unspecified injury of nose, initial encounter: Secondary | ICD-10-CM | POA: Diagnosis present

## 2020-10-26 DIAGNOSIS — Z96643 Presence of artificial hip joint, bilateral: Secondary | ICD-10-CM | POA: Insufficient documentation

## 2020-10-26 DIAGNOSIS — M25552 Pain in left hip: Secondary | ICD-10-CM | POA: Insufficient documentation

## 2020-10-26 DIAGNOSIS — I5022 Chronic systolic (congestive) heart failure: Secondary | ICD-10-CM | POA: Diagnosis not present

## 2020-10-26 DIAGNOSIS — W101XXA Fall (on)(from) sidewalk curb, initial encounter: Secondary | ICD-10-CM | POA: Diagnosis not present

## 2020-10-26 DIAGNOSIS — M542 Cervicalgia: Secondary | ICD-10-CM | POA: Diagnosis not present

## 2020-10-26 DIAGNOSIS — Z7901 Long term (current) use of anticoagulants: Secondary | ICD-10-CM | POA: Diagnosis not present

## 2020-10-26 DIAGNOSIS — I2510 Atherosclerotic heart disease of native coronary artery without angina pectoris: Secondary | ICD-10-CM | POA: Diagnosis not present

## 2020-10-26 DIAGNOSIS — M47817 Spondylosis without myelopathy or radiculopathy, lumbosacral region: Secondary | ICD-10-CM | POA: Diagnosis not present

## 2020-10-26 DIAGNOSIS — S0003XA Contusion of scalp, initial encounter: Secondary | ICD-10-CM | POA: Diagnosis not present

## 2020-10-26 DIAGNOSIS — S0121XA Laceration without foreign body of nose, initial encounter: Secondary | ICD-10-CM | POA: Diagnosis not present

## 2020-10-26 DIAGNOSIS — I6782 Cerebral ischemia: Secondary | ICD-10-CM | POA: Diagnosis not present

## 2020-10-26 DIAGNOSIS — W19XXXA Unspecified fall, initial encounter: Secondary | ICD-10-CM

## 2020-10-26 DIAGNOSIS — T07XXXA Unspecified multiple injuries, initial encounter: Secondary | ICD-10-CM

## 2020-10-26 DIAGNOSIS — N183 Chronic kidney disease, stage 3 unspecified: Secondary | ICD-10-CM | POA: Diagnosis not present

## 2020-10-26 DIAGNOSIS — Z950 Presence of cardiac pacemaker: Secondary | ICD-10-CM | POA: Diagnosis not present

## 2020-10-26 DIAGNOSIS — Z86718 Personal history of other venous thrombosis and embolism: Secondary | ICD-10-CM | POA: Diagnosis not present

## 2020-10-26 DIAGNOSIS — M19041 Primary osteoarthritis, right hand: Secondary | ICD-10-CM | POA: Diagnosis not present

## 2020-10-26 DIAGNOSIS — S0181XA Laceration without foreign body of other part of head, initial encounter: Secondary | ICD-10-CM | POA: Diagnosis not present

## 2020-10-26 DIAGNOSIS — G319 Degenerative disease of nervous system, unspecified: Secondary | ICD-10-CM | POA: Diagnosis not present

## 2020-10-26 DIAGNOSIS — M25511 Pain in right shoulder: Secondary | ICD-10-CM | POA: Diagnosis not present

## 2020-10-26 NOTE — ED Provider Notes (Signed)
[facility] EMERGENCY DEPARTMENT Provider Note   CSN: [id number] Arrival date & time: 10/26/20  2134     History Chief Complaint  Patient presents with  . Fall  . Trauma    CASMER YEPIZ is a 80 y.o. male.  HPI Patient is an 80 year old male with a complicated medical history as noted below.  Anticoagulated on Eliquis for atrial fibrillation.  Pacemaker in place.  Patient states he was leaving a theater just prior to arrival and was walking on a concrete surface and tripped on the curb.  He was stumbling forwards and fell to the ground striking his face on the concrete.  Patient has an abrasion to the bridge of his nose as well as the central portion of his forehead.  Mild bleeding at this time.  Also reports right shoulder pain, right hand pain, as well as left hip pain.  Patient is ambulatory.  Worsening pain with movement.  No numbness, chest pain, shortness of breath, abdominal pain.    Past Medical History:  Diagnosis Date  . Allergic rhinitis    takes Allegra daily  . Allergic rhinitis, cause unspecified 06/22/2012  . Arthritis   . Atrial fibrillation ([place])    takes Tikosyn and Eliquis daily  . 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 Coronary artery disease    LAD stenting 2004 non-DES  . Depression    took Zoloft 35yrs ago but nothing now  . Diverticulosis   . DVT (deep venous thrombosis) ([place])   . Dysphagia    occasionally  . History of blood clots 2003   left leg prior to fem pop  . History of colon polyps   . Hyperlipidemia    takes Tricor and Lipitor daily  . Insomnia    takes Ambien nightly as needed  . Joint pain   . Joint swelling   . 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 OSA (obstructive sleep apnea)    CPAP  . Pacemaker   . Peripheral vascular disease ([place])   . Pleurisy    early 67's  . Urinary urgency     Patient Active Problem List   Diagnosis Date Noted  . Eustachian tube dysfunction  06/27/2020  . CKD (chronic kidney disease) stage 3, GFR 30-59 ml/min (HCC) 06/27/2020  . Vertigo 06/27/2020  . Olecranon bursitis, right elbow 06/27/2020  . Sinus node dysfunction ([place]) 09/10/2019  . LBBB (left bundle branch block) 09/10/2019  . Hyperglycemia 04/19/2016  . Chronic systolic heart failure ([place]) 05/05/2015  . Insomnia 04/07/2015  . Aftercare following surgery of the circulatory system, [place] 04/24/2014  . Degenerative joint disease (DJD) of hip 12/09/2013  . 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 Osteoarthritis of left hip 11/05/2013  . Chronic left sacroiliac joint pain 10/13/2013  . Discoloration of skin of lower leg-Left 04/11/2013  . Allergic rhinitis 06/22/2012  . Encounter for well adult exam with abnormal findings 06/21/2012  . Chronic sinusitis 06/21/2012  . Aneurysm artery, popliteal ([place]) 01/30/2012  . Erectile dysfunction 02/21/2011  . 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 CERVICAL RADICULOPATHY, LEFT 05/31/2010  . Atrial fibrillation ([place]) 05/11/2009  . 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 LATERAL EPICONDYLITIS, RIGHT 02/02/2009  . HYPERLIPIDEMIA 01/07/2008  . DEPRESSION 01/07/2008  . OBSTRUCTIVE SLEEP APNEA 01/07/2008  . Peripheral vascular disease ([place]) 01/07/2008  . DIVERTICULOSIS, COLON 01/07/2008    Past Surgical History:  Procedure Laterality Date  . CARDIAC CATHETERIZATION  2004  . CARDIAC CATHETERIZATION  N/A 05/06/2015   Procedure: Left Heart Cath and Coronary Angiography;  Surgeon: Belva Crome, MD;  Location: [place] CV LAB;  Service: Cardiovascular;  Laterality: N/A;  . COLONOSCOPY    . hand and arm surgery Right    as a teenager  . HAND SURGERY Left   . INGUINAL HERNIA REPAIR Right   . JOINT REPLACEMENT Left December 09, 2013   Left Hip   . JOINT REPLACEMENT Right Aug. 25, 2015   Right Hip  . left knee surgery    . PACEMAKER INSERTION     due to bradycardia  . PERMANENT PACEMAKER GENERATOR CHANGE N/A 06/17/2012   Procedure: PERMANENT  PACEMAKER GENERATOR CHANGE;  Surgeon: Deboraha Sprang, MD;  Location: [facility] CATH LAB;  Service: Cardiovascular;  Laterality: N/A;  . right and left fem-pop bypass    . stent (unspec)     x2  . TOTAL HIP ARTHROPLASTY Left 12/09/2013   Procedure: TOTAL HIP ARTHROPLASTY ANTERIOR APPROACH;  Surgeon: Hessie Dibble, MD;  Location: [place];  Service: Orthopedics;  Laterality: Left;  left anterior total hip arthroplasty  . TOTAL HIP ARTHROPLASTY Right 05/05/2014   Procedure: TOTAL HIP ARTHROPLASTY ANTERIOR APPROACH;  Surgeon: Hessie Dibble, MD;  Location: [place];  Service: Orthopedics;  Laterality: Right;  . TURBINATE REDUCTION    . wisdom extracted          Family History  Problem Relation Age of Onset  . Diabetes Other        family hx  . Sleep apnea Other        family hx  . Diabetes Mother   . Heart disease Mother        Before age 73  . Heart disease Father        Before age 78  . Heart attack Father   . Stroke Father   . Aneurysm Father        bilat pop art aneurysms  . Peripheral vascular disease Father        Popliteal Aneurysm  . Allergic rhinitis Neg Hx   . Angioedema Neg Hx   . Asthma Neg Hx   . Eczema Neg Hx   . Immunodeficiency Neg Hx   . Urticaria Neg Hx     Social History   Tobacco Use  . Smoking status: Never Smoker  . Smokeless tobacco: Never Used  Vaping Use  . Vaping Use: Never used  Substance Use Topics  . Alcohol use: No    Alcohol/week: 0.0 standard drinks    Comment: nothing since 2000  . Drug use: No    Home Medications Prior to Admission medications   Medication Sig Start Date End Date Taking? Authorizing Provider  atorvastatin (LIPITOR) 80 MG tablet TAKE 1 TABLET BY MOUTH  DAILY 09/23/20   Deboraha Sprang, MD  CALCIUM PO Take 600 mg by mouth 2 (two) times daily.    [provider]  carvedilol (COREG) 25 MG tablet TAKE 2 TABLETS BY MOUTH  TWICE DAILY 09/23/20   Deboraha Sprang, MD  Cholecalciferol (D3 VITAMIN PO) Take 4,000 Int'l Units  by mouth daily.    [provider]  Coenzyme Q10 (COQ-10 PO) Take 300 mg by mouth daily.    [provider]  Cyanocobalamin (VITAMIN B 12 PO) Take 1 tablet by mouth daily.    [provider]  dofetilide (TIKOSYN) 500 MCG capsule TAKE 1 CAPSULE BY MOUTH  TWICE DAILY 09/23/20   Deboraha Sprang,  MD  ELIQUIS 5 MG TABS tablet TAKE 1 TABLET BY MOUTH  TWICE DAILY 09/23/20   Deboraha Sprang, MD  fenofibrate 160 MG tablet TAKE 1 TABLET BY MOUTH  DAILY 09/23/20   Deboraha Sprang, MD  fexofenadine (ALLEGRA) 180 MG tablet Take 180 mg by mouth daily.    [provider]  Fluocinonide Emulsified Base 0.05 % CREA APPLY TO AFFECTED AREA(S)  TWO TIMES DAILY AS NEEDED 01/16/19   Biagio Borg, MD  FOLIC ACID PO Take 1 tablet by mouth daily.    [provider]  GLUCOSAMINE CHONDROITIN COMPLX PO Take 1 tablet by mouth 2 (two) times daily.     [provider]  losartan (COZAAR) 25 MG tablet TAKE 1 TABLET BY MOUTH  DAILY 09/23/20   Deboraha Sprang, MD  Lysine 500 MG TABS Take 500 mg by mouth 3 (three) times daily.    [provider]  Magnesium 250 MG TABS Take 250 mg by mouth 2 (two) times daily.     [provider]  meclizine (ANTIVERT) 12.5 MG tablet Take 1 tablet (12.5 mg total) by mouth 3 (three) times daily as needed for dizziness. 07/05/20 07/05/21  Biagio Borg, MD  Omega-3 Fatty Acids (FISH OIL) 1200 MG CAPS Take 1,200 mg by mouth daily.     [provider]  potassium chloride (KLOR-CON) 10 MEQ tablet TAKE 1 TABLET BY MOUTH  TWICE DAILY 09/23/20   Deboraha Sprang, MD  ranitidine (ZANTAC) 300 MG tablet Take 1 tablet (300 mg total) by mouth at bedtime. 03/08/17   Kozlow, Donnamarie Poag, MD  ranolazine (RANEXA) 500 MG 12 hr tablet Take 1 tablet (500 mg total) by mouth 2 (two) times daily. 06/01/20   Deboraha Sprang, MD  triamcinolone (NASACORT) 55 MCG/ACT AERO nasal inhaler Place 2 sprays into both nostrils daily.     [provider]     Allergies    Amiodarone, Niacin, Ace inhibitors, and Mometasone furoate  Review of Systems   Review of Systems  All other systems reviewed and are negative. Ten systems reviewed and are negative for acute change, except as noted in the HPI.   Physical Exam Updated Vital Signs BP (!) 146/78 (BP Location: Right Arm)   Pulse 60   Temp 98.6 F (37 C)   Resp 19   Ht 6\' 4"  (1.93 m)   Wt 99.1 kg   SpO2 99%   BMI 26.59 kg/m   Physical Exam Vitals and nursing note reviewed.  Constitutional:      General: He is not in acute distress.    Appearance: Normal appearance. He is not ill-appearing, toxic-appearing or diaphoretic.  HENT:     Head: Normocephalic.     Comments: Large abrasion with mild bleeding noted to the central forehead.  Additional abrasion noted to the bridge of the nose with a 0.5 cm laceration noted in the center of the abrasion.  Moderate bleeding coming from the site.  No blood noted coming from the nares.    Right Ear: External ear normal.     Left Ear: External ear normal.     Nose: Nose normal.     Mouth/Throat:     Mouth: Mucous membranes are moist.     Pharynx: Oropharynx is clear. No oropharyngeal exudate or posterior oropharyngeal erythema.  Eyes:     General: No scleral icterus.       Right eye: No discharge.        Left  eye: No discharge.     Extraocular Movements: Extraocular movements intact.     Conjunctiva/sclera: Conjunctivae normal.  Neck:     Comments: No midline spine pain.  Cardiovascular:     Rate and Rhythm: Normal rate and regular rhythm.     Pulses: Normal pulses.     Heart sounds: Normal heart sounds. No murmur heard. No friction rub. No gallop.      Comments: Regular rate and rhythm without murmurs, rubs, or gallops. Pulmonary:     Effort: Pulmonary effort is normal. No respiratory distress.     Breath sounds: Normal breath sounds. No stridor. No wheezing, rhonchi or rales.  Abdominal:     General: Abdomen is flat.      Tenderness: There is no abdominal tenderness.  Musculoskeletal:        General: Tenderness present. No deformity. Normal range of motion.     Cervical back: Normal range of motion and neck supple. No tenderness.     Right lower leg: No edema.     Left lower leg: No edema.     Comments: Right shoulder: Mild TTP noted along the right deltoid.  Patient can abduct and flex the arm to about 60 degrees but stops at this point due to pain.  No obvious deformities.  No bony tenderness. No visible or palpable signs of trauma.  Right hand: Mild TTP noted along the dorsum of the right hand.  No obvious trauma or deformities.  Patient able to flex and extend all 5 fingers fully.  Good cap refill.  Distal sensation intact in the arm.  2+ radial pulses.  Pelvis: No instability noted with manipulation of the pelvic girdle.  There is mild TTP noted to the left hip.  No midline spine pain.  Skin:    General: Skin is warm and dry.  Neurological:     General: No focal deficit present.     Mental Status: He is alert and oriented to person, place, and time. Mental status is at baseline.     Comments: Moving all four extremities, though with difficulty in the right arm due to shoulder pain. No gross deficits. Grip strength 5/5. Distal sensation intact. A&O x 4. Ambulatory with a steady gait.   Psychiatric:        Mood and Affect: Mood normal.        Behavior: Behavior normal.    ED Results / Procedures / Treatments   Labs (all labs ordered are listed, but only abnormal results are displayed) Labs Reviewed - No data to display  EKG None  Radiology DG Shoulder Right  Result Date: 10/26/2020 CLINICAL DATA:  Right shoulder pain after fall EXAM: RIGHT SHOULDER - 2+ VIEW COMPARISON:  None. FINDINGS: Frontal and transscapular views of the right shoulder are obtained. No acute displaced fracture. Moderate osteoarthritis of the acromioclavicular joint with prominent spurring of the acromion process. Mild  glenohumeral osteoarthritis. The right chest is clear. IMPRESSION: 1. Right shoulder osteoarthritis.  No acute fracture. Electronically Signed   By: Randa Ngo M.D.   On: 10/26/2020 22:17   CT Head Wo Contrast  Result Date: 10/26/2020 CLINICAL DATA:  Fall off curb striking forehead. On anticoagulation. EXAM: CT HEAD WITHOUT CONTRAST TECHNIQUE: Contiguous axial images were obtained from the base of the skull through the vertex without intravenous contrast. COMPARISON:  None. FINDINGS: Brain: Age related atrophy. Mild periventricular chronic small vessel ischemia. No intracranial hemorrhage, mass effect, or midline shift. No hydrocephalus. The basilar cisterns are patent.  No evidence of territorial infarct or acute ischemia. No extra-axial or intracranial fluid collection. Vascular: Atherosclerosis of skullbase vasculature without hyperdense vessel or abnormal calcification. Skull: No skull fracture.  Midline frontal scalp hematoma. Sinuses/Orbits: No fracture of the included facial bones. No orbital fracture. Mastoid air cells are clear. Other: None. IMPRESSION: 1. Midline frontal scalp hematoma. No acute intracranial abnormality. No skull fracture. 2. Age related atrophy and chronic small vessel ischemia. Electronically Signed   By: Keith Rake M.D.   On: 10/26/2020 22:40   CT Cervical Spine Wo Contrast  Result Date: 10/26/2020 CLINICAL DATA:  Acute neck pain.  Fall off curb. EXAM: CT CERVICAL SPINE WITHOUT CONTRAST TECHNIQUE: Multidetector CT imaging of the cervical spine was performed without intravenous contrast. Multiplanar CT image reconstructions were also generated. COMPARISON:  None. FINDINGS: Alignment: Straightening of normal lordosis. No traumatic subluxation. Skull base and vertebrae: No acute fracture. Vertebral body heights are maintained. The dens and skull base are intact. Soft tissues and spinal canal: No prevertebral fluid or swelling. No visible canal hematoma. Disc levels:  Multilevel degenerative disc disease. Disc space narrowing and endplate spurring is most prominent at C6-C7 where there is bony canal narrowing. Multilevel facet hypertrophy. Partial ankylosis of C2-C3 and C4-C5 facets on the left. Upper chest: No acute or unexpected findings. Other: None. IMPRESSION: 1. No acute fracture or subluxation of the cervical spine. 2. Multilevel degenerative disc disease and facet hypertrophy. Electronically Signed   By: Keith Rake M.D.   On: 10/26/2020 23:34   DG Hand Complete Right  Result Date: 10/26/2020 CLINICAL DATA:  Pain after fall EXAM: RIGHT HAND - COMPLETE 3+ VIEW COMPARISON:  None. FINDINGS: Frontal, oblique, and lateral views of the right hand are obtained. No acute displaced fractures. There is diffuse osteoarthritis greatest at the first carpometacarpal joint, first through third metacarpophalangeal joints, and diffusely throughout the interphalangeal joints. Mild subluxation of the first carpometacarpal joint without dislocation. Otherwise alignment is anatomic. Soft tissues are unremarkable. IMPRESSION: 1. No acute displaced fracture. 2. Extensive multifocal osteoarthritis greatest at the first carpometacarpal joint. Electronically Signed   By: Randa Ngo M.D.   On: 10/26/2020 22:16   DG Hip Unilat W or Wo Pelvis 1 View Left  Result Date: 10/26/2020 CLINICAL DATA:  Left hip pain after fall EXAM: DG HIP (WITH OR WITHOUT PELVIS) 1V*L* COMPARISON:  None. FINDINGS: Frontal view of the pelvis as well as a frogleg lateral view of the left hip are obtained. There are bilateral hip arthroplasties in the expected position with no signs of acute complication. No acute displaced pelvic fractures. Sacroiliac joints are unremarkable. Prominent degenerative changes at the lumbosacral junction. IMPRESSION: 1. Unremarkable bilateral hip arthroplasties. No acute displaced fractures. Electronically Signed   By: Randa Ngo M.D.   On: 10/26/2020 22:20     Procedures .Marland KitchenLaceration Repair  Date/Time: 10/28/2020 10:58 AM Performed by: Rayna Sexton, PA-C Authorized by: Rayna Sexton, PA-C   Consent:    Consent obtained:  Verbal   Consent given by:  Patient   Risks, benefits, and alternatives were discussed: not applicable     Risks discussed:  Pain and poor cosmetic result Universal protocol:    Procedure explained and questions answered to patient or proxy's satisfaction: yes     Test results available: yes     Imaging studies available: yes     Patient identity confirmed:  Verbally with patient and arm band Anesthesia:    Anesthesia method:  None Laceration details:    Location:  Face   Face location:  Nose   Length (cm):  0.5 Pre-procedure details:    Preparation:  Imaging obtained to evaluate for foreign bodies Treatment:    Area cleansed with:  Saline   Amount of cleaning:  Standard Skin repair:    Repair method:  Tissue adhesive Approximation:    Approximation:  Close Repair type:    Repair type:  Simple Post-procedure details:    Dressing:  Open (no dressing)   Procedure completion:  Tolerated well, no immediate complications     Medications Ordered in ED Medications - No data to display  ED Course  I have reviewed the triage vital signs and the nursing notes.  Pertinent labs & imaging results that were available during my care of the patient were reviewed by me and considered in my medical decision making (see chart for details).    MDM Rules/Calculators/A&P                          Pt is a 80 y.o. male with a history of atrial fibrillation on Eliquis as well as bilateral hip arthroplasties who presents to the ED as a level 2 fall. Pt tripped on the curb of a sidewalk and fell forwards when stumbling striking his face on a concrete surface.   Imaging: CT of the head shows midline frontal scalp hematoma without acute intracranial abnormalities. CT cervical spine shows no acute fracture or  subluxation. DG right shoulder no acute fracture. DG right hand no acute fracture. DG pelvis/left hip no acute fracture with bilateral hip arthroplasties.   I, Rayna Sexton, PA-C, personally reviewed and evaluated these images and lab results as part of my medical decision-making.  Physical exam reassuring today. Given his fall on thinners a CT of the head was obtained. Pt initially had no C-spine pain on my exam but later noted inferior C-spine pain so additional CT of the C-spine was obtained. Both reassuring.   X-rays all reassuring as well. Pt did have a moderate degree of pain over the right deltoid but didn't appear to have any bony TTP, signs of trauma or visible deformities. He was only able to abduct and flex at the right shoulder to about 60 degrees. Due to this I placed him in a right shoulder sling. He has an orthopedist and is planning on calling them tomorrow morning for reevaluation.   Pt had a small laceration to the bridge of the nose that was cleaned and closed with dermabond. See procedure note above. Pt appeared stable for discharge and he was amenable with being discharged. His questions were answered and he and his wife were amicable at the time of discharge.   Note: Portions of this report may have been transcribed using voice recognition software. Every effort was made to ensure accuracy; however, inadvertent computerized transcription errors may be present.    Final Clinical Impression(s) / ED Diagnoses Final diagnoses:  Fall, initial encounter  Abrasions of multiple sites  Acute pain of right shoulder    Rx / DC Orders ED Discharge Orders    None       Rayna Sexton, PA-C 10/28/20 1103    Noemi Chapel, MD 10/31/20 1946

## 2020-10-26 NOTE — Discharge Instructions (Signed)
Like we discussed, all of your imaging today is reassuring.  Due to your shoulder pain you have been placed in a shoulder sling.  You can wear this for the next 24 to 48 hours as needed for support.  Please follow-up with your orthopedist regarding your right shoulder and schedule an appointment for reevaluation.  If you develop any new or worsening symptoms that we discussed, please return to the emergency department for reevaluation.  It was a pleasure to meet you both.

## 2020-10-26 NOTE — ED Notes (Signed)
X-ray at bedside

## 2020-10-26 NOTE — ED Triage Notes (Signed)
Pt coming from triage, reporting falling off a curb and hitting forehead. Pt on eliquis. Left hip pain,  Reporting right shoulder pain right hand pain. Pt ambulatory to room, steady gait, aao4. Pt has a pacemaker.

## 2020-10-27 DIAGNOSIS — M25511 Pain in right shoulder: Secondary | ICD-10-CM | POA: Diagnosis not present

## 2020-10-27 DIAGNOSIS — M75121 Complete rotator cuff tear or rupture of right shoulder, not specified as traumatic: Secondary | ICD-10-CM | POA: Diagnosis not present

## 2020-10-30 ENCOUNTER — Other Ambulatory Visit (HOSPITAL_COMMUNITY)
Admission: RE | Admit: 2020-10-30 | Discharge: 2020-10-30 | Disposition: A | Discharge status: Home / Self Care | Payer: Medicare Other | Source: Ambulatory Visit | Attending: Internal Medicine | Admitting: Internal Medicine

## 2020-10-30 DIAGNOSIS — Z20822 Contact with and (suspected) exposure to covid-19: Secondary | ICD-10-CM | POA: Insufficient documentation

## 2020-10-30 DIAGNOSIS — Z01812 Encounter for preprocedural laboratory examination: Secondary | ICD-10-CM | POA: Diagnosis not present

## 2020-10-30 LAB — SARS CORONAVIRUS 2 (TAT 6-24 HRS): SARS Coronavirus 2: NEGATIVE

## 2020-11-01 ENCOUNTER — Telehealth (HOSPITAL_COMMUNITY): Payer: Self-pay

## 2020-11-01 NOTE — Telephone Encounter (Signed)
Spoke with the patient, detailed instructions given. He stated that he will be here for his test. Asked to call back with any questions. S.Adya Wirz EMTP

## 2020-11-02 ENCOUNTER — Other Ambulatory Visit: Payer: Self-pay

## 2020-11-02 ENCOUNTER — Ambulatory Visit (HOSPITAL_COMMUNITY): Payer: Medicare Other | Attending: Internal Medicine

## 2020-11-02 DIAGNOSIS — I428 Other cardiomyopathies: Secondary | ICD-10-CM | POA: Insufficient documentation

## 2020-11-02 DIAGNOSIS — M25611 Stiffness of right shoulder, not elsewhere classified: Secondary | ICD-10-CM | POA: Diagnosis not present

## 2020-11-02 DIAGNOSIS — I5022 Chronic systolic (congestive) heart failure: Secondary | ICD-10-CM | POA: Diagnosis not present

## 2020-11-02 DIAGNOSIS — M6281 Muscle weakness (generalized): Secondary | ICD-10-CM | POA: Diagnosis not present

## 2020-11-02 DIAGNOSIS — I480 Paroxysmal atrial fibrillation: Secondary | ICD-10-CM | POA: Diagnosis not present

## 2020-11-02 DIAGNOSIS — M67911 Unspecified disorder of synovium and tendon, right shoulder: Secondary | ICD-10-CM | POA: Diagnosis not present

## 2020-11-02 LAB — MYOCARDIAL PERFUSION IMAGING
LV dias vol: 125 mL (ref 62–150)
LV sys vol: 100 mL
Peak HR: 66 {beats}/min
Rest HR: 60 {beats}/min
SDS: 2
SRS: 2
SSS: 4
TID: 0.94

## 2020-11-02 MED ORDER — TECHNETIUM TC 99M TETROFOSMIN IV KIT
10.3000 | PACK | Freq: Once | INTRAVENOUS | Status: AC | PRN
  Administered 2020-11-02: 10.3 via INTRAVENOUS
  Filled 2020-11-02: qty 11

## 2020-11-02 MED ORDER — REGADENOSON 0.4 MG/5ML IV SOLN
0.4000 mg | Freq: Once | INTRAVENOUS | Status: AC
  Administered 2020-11-02: 0.4 mg via INTRAVENOUS

## 2020-11-02 MED ORDER — TECHNETIUM TC 99M TETROFOSMIN IV KIT
31.6000 | PACK | Freq: Once | INTRAVENOUS | Status: AC | PRN
  Administered 2020-11-02: 31.6 via INTRAVENOUS
  Filled 2020-11-02: qty 32

## 2020-11-08 ENCOUNTER — Other Ambulatory Visit: Payer: Self-pay

## 2020-11-08 DIAGNOSIS — R9439 Abnormal result of other cardiovascular function study: Secondary | ICD-10-CM

## 2020-11-08 DIAGNOSIS — I5022 Chronic systolic (congestive) heart failure: Secondary | ICD-10-CM

## 2020-11-08 DIAGNOSIS — I4891 Unspecified atrial fibrillation: Secondary | ICD-10-CM

## 2020-11-08 NOTE — Progress Notes (Signed)
Order place for echo per Dr Caryl Comes due to abnormal stress test which shows low EF.  Dr Caryl Comes would like to confirm this is accurate and not artifactual.

## 2020-11-10 DIAGNOSIS — M67911 Unspecified disorder of synovium and tendon, right shoulder: Secondary | ICD-10-CM | POA: Diagnosis not present

## 2020-11-10 DIAGNOSIS — M25611 Stiffness of right shoulder, not elsewhere classified: Secondary | ICD-10-CM | POA: Diagnosis not present

## 2020-11-10 DIAGNOSIS — M6281 Muscle weakness (generalized): Secondary | ICD-10-CM | POA: Diagnosis not present

## 2020-11-12 DIAGNOSIS — M67911 Unspecified disorder of synovium and tendon, right shoulder: Secondary | ICD-10-CM | POA: Diagnosis not present

## 2020-11-12 DIAGNOSIS — M6281 Muscle weakness (generalized): Secondary | ICD-10-CM | POA: Diagnosis not present

## 2020-11-12 DIAGNOSIS — M25611 Stiffness of right shoulder, not elsewhere classified: Secondary | ICD-10-CM | POA: Diagnosis not present

## 2020-11-16 DIAGNOSIS — M67911 Unspecified disorder of synovium and tendon, right shoulder: Secondary | ICD-10-CM | POA: Diagnosis not present

## 2020-11-16 DIAGNOSIS — M6281 Muscle weakness (generalized): Secondary | ICD-10-CM | POA: Diagnosis not present

## 2020-11-16 DIAGNOSIS — M25611 Stiffness of right shoulder, not elsewhere classified: Secondary | ICD-10-CM | POA: Diagnosis not present

## 2020-11-18 DIAGNOSIS — M25611 Stiffness of right shoulder, not elsewhere classified: Secondary | ICD-10-CM | POA: Diagnosis not present

## 2020-11-18 DIAGNOSIS — M67911 Unspecified disorder of synovium and tendon, right shoulder: Secondary | ICD-10-CM | POA: Diagnosis not present

## 2020-11-18 DIAGNOSIS — M6281 Muscle weakness (generalized): Secondary | ICD-10-CM | POA: Diagnosis not present

## 2020-11-22 DIAGNOSIS — M6281 Muscle weakness (generalized): Secondary | ICD-10-CM | POA: Diagnosis not present

## 2020-11-22 DIAGNOSIS — M67911 Unspecified disorder of synovium and tendon, right shoulder: Secondary | ICD-10-CM | POA: Diagnosis not present

## 2020-11-22 DIAGNOSIS — M25611 Stiffness of right shoulder, not elsewhere classified: Secondary | ICD-10-CM | POA: Diagnosis not present

## 2020-11-24 ENCOUNTER — Other Ambulatory Visit: Payer: Self-pay

## 2020-11-24 ENCOUNTER — Ambulatory Visit (HOSPITAL_COMMUNITY): Payer: Medicare Other | Attending: Cardiology

## 2020-11-24 DIAGNOSIS — M25511 Pain in right shoulder: Secondary | ICD-10-CM | POA: Diagnosis not present

## 2020-11-24 DIAGNOSIS — M67911 Unspecified disorder of synovium and tendon, right shoulder: Secondary | ICD-10-CM | POA: Diagnosis not present

## 2020-11-24 DIAGNOSIS — M25611 Stiffness of right shoulder, not elsewhere classified: Secondary | ICD-10-CM | POA: Diagnosis not present

## 2020-11-24 DIAGNOSIS — I5022 Chronic systolic (congestive) heart failure: Secondary | ICD-10-CM | POA: Diagnosis not present

## 2020-11-24 DIAGNOSIS — I4891 Unspecified atrial fibrillation: Secondary | ICD-10-CM | POA: Diagnosis not present

## 2020-11-24 DIAGNOSIS — R9439 Abnormal result of other cardiovascular function study: Secondary | ICD-10-CM | POA: Insufficient documentation

## 2020-11-24 DIAGNOSIS — M6281 Muscle weakness (generalized): Secondary | ICD-10-CM | POA: Diagnosis not present

## 2020-11-24 LAB — ECHOCARDIOGRAM COMPLETE
Area-P 1/2: 6.65 cm2
MV M vel: 4.94 m/s
MV Peak grad: 97.6 mmHg
S' Lateral: 4.3 cm

## 2020-11-30 DIAGNOSIS — M6281 Muscle weakness (generalized): Secondary | ICD-10-CM | POA: Diagnosis not present

## 2020-11-30 DIAGNOSIS — M67911 Unspecified disorder of synovium and tendon, right shoulder: Secondary | ICD-10-CM | POA: Diagnosis not present

## 2020-11-30 DIAGNOSIS — M25611 Stiffness of right shoulder, not elsewhere classified: Secondary | ICD-10-CM | POA: Diagnosis not present

## 2020-12-02 DIAGNOSIS — M6281 Muscle weakness (generalized): Secondary | ICD-10-CM | POA: Diagnosis not present

## 2020-12-02 DIAGNOSIS — M67911 Unspecified disorder of synovium and tendon, right shoulder: Secondary | ICD-10-CM | POA: Diagnosis not present

## 2020-12-02 DIAGNOSIS — M25611 Stiffness of right shoulder, not elsewhere classified: Secondary | ICD-10-CM | POA: Diagnosis not present

## 2020-12-08 DIAGNOSIS — M25611 Stiffness of right shoulder, not elsewhere classified: Secondary | ICD-10-CM | POA: Diagnosis not present

## 2020-12-08 DIAGNOSIS — M67911 Unspecified disorder of synovium and tendon, right shoulder: Secondary | ICD-10-CM | POA: Diagnosis not present

## 2020-12-08 DIAGNOSIS — M6281 Muscle weakness (generalized): Secondary | ICD-10-CM | POA: Diagnosis not present

## 2020-12-10 DIAGNOSIS — M6281 Muscle weakness (generalized): Secondary | ICD-10-CM | POA: Diagnosis not present

## 2020-12-10 DIAGNOSIS — M25611 Stiffness of right shoulder, not elsewhere classified: Secondary | ICD-10-CM | POA: Diagnosis not present

## 2020-12-10 DIAGNOSIS — M67911 Unspecified disorder of synovium and tendon, right shoulder: Secondary | ICD-10-CM | POA: Diagnosis not present

## 2020-12-11 ENCOUNTER — Encounter (HOSPITAL_BASED_OUTPATIENT_CLINIC_OR_DEPARTMENT_OTHER): Payer: Self-pay

## 2020-12-11 ENCOUNTER — Emergency Department (HOSPITAL_BASED_OUTPATIENT_CLINIC_OR_DEPARTMENT_OTHER)
Admission: EM | Admit: 2020-12-11 | Discharge: 2020-12-11 | Disposition: A | Discharge status: Home / Self Care | Payer: Medicare Other | Attending: Emergency Medicine | Admitting: Emergency Medicine

## 2020-12-11 ENCOUNTER — Emergency Department (HOSPITAL_BASED_OUTPATIENT_CLINIC_OR_DEPARTMENT_OTHER): Payer: Medicare Other

## 2020-12-11 ENCOUNTER — Other Ambulatory Visit: Payer: Self-pay

## 2020-12-11 DIAGNOSIS — I5022 Chronic systolic (congestive) heart failure: Secondary | ICD-10-CM | POA: Insufficient documentation

## 2020-12-11 DIAGNOSIS — K5792 Diverticulitis of intestine, part unspecified, without perforation or abscess without bleeding: Secondary | ICD-10-CM | POA: Insufficient documentation

## 2020-12-11 DIAGNOSIS — Z79899 Other long term (current) drug therapy: Secondary | ICD-10-CM | POA: Diagnosis not present

## 2020-12-11 DIAGNOSIS — R1032 Left lower quadrant pain: Secondary | ICD-10-CM | POA: Diagnosis present

## 2020-12-11 DIAGNOSIS — Z96643 Presence of artificial hip joint, bilateral: Secondary | ICD-10-CM | POA: Diagnosis not present

## 2020-12-11 DIAGNOSIS — Z950 Presence of cardiac pacemaker: Secondary | ICD-10-CM | POA: Insufficient documentation

## 2020-12-11 DIAGNOSIS — Z7901 Long term (current) use of anticoagulants: Secondary | ICD-10-CM | POA: Insufficient documentation

## 2020-12-11 DIAGNOSIS — I4891 Unspecified atrial fibrillation: Secondary | ICD-10-CM | POA: Diagnosis not present

## 2020-12-11 DIAGNOSIS — R1031 Right lower quadrant pain: Secondary | ICD-10-CM | POA: Diagnosis not present

## 2020-12-11 DIAGNOSIS — D72829 Elevated white blood cell count, unspecified: Secondary | ICD-10-CM | POA: Insufficient documentation

## 2020-12-11 DIAGNOSIS — I2510 Atherosclerotic heart disease of native coronary artery without angina pectoris: Secondary | ICD-10-CM | POA: Diagnosis not present

## 2020-12-11 DIAGNOSIS — K5732 Diverticulitis of large intestine without perforation or abscess without bleeding: Secondary | ICD-10-CM | POA: Diagnosis not present

## 2020-12-11 DIAGNOSIS — N183 Chronic kidney disease, stage 3 unspecified: Secondary | ICD-10-CM | POA: Insufficient documentation

## 2020-12-11 DIAGNOSIS — K5730 Diverticulosis of large intestine without perforation or abscess without bleeding: Secondary | ICD-10-CM | POA: Diagnosis not present

## 2020-12-11 HISTORY — DX: Inflammatory disease of prostate, unspecified: N41.9

## 2020-12-11 LAB — CBC WITH DIFFERENTIAL/PLATELET
Abs Immature Granulocytes: 0.08 10*3/uL — ABNORMAL HIGH (ref 0.00–0.07)
Basophils Absolute: 0 10*3/uL (ref 0.0–0.1)
Basophils Relative: 0 %
Eosinophils Absolute: 0 10*3/uL (ref 0.0–0.5)
Eosinophils Relative: 0 %
HCT: 39.4 % (ref 39.0–52.0)
Hemoglobin: 13.2 g/dL (ref 13.0–17.0)
Immature Granulocytes: 1 %
Lymphocytes Relative: 7 %
Lymphs Abs: 0.8 10*3/uL (ref 0.7–4.0)
MCH: 33.1 pg (ref 26.0–34.0)
MCHC: 33.5 g/dL (ref 30.0–36.0)
MCV: 98.7 fL (ref 80.0–100.0)
Monocytes Absolute: 0.7 10*3/uL (ref 0.1–1.0)
Monocytes Relative: 6 %
Neutro Abs: 9.6 10*3/uL — ABNORMAL HIGH (ref 1.7–7.7)
Neutrophils Relative %: 86 %
Platelets: 111 10*3/uL — ABNORMAL LOW (ref 150–400)
RBC: 3.99 MIL/uL — ABNORMAL LOW (ref 4.22–5.81)
RDW: 14.5 % (ref 11.5–15.5)
WBC: 11.5 10*3/uL — ABNORMAL HIGH (ref 4.0–10.5)
nRBC: 0 % (ref 0.0–0.2)

## 2020-12-11 LAB — COMPREHENSIVE METABOLIC PANEL
ALT: 24 U/L (ref 0–44)
AST: 26 U/L (ref 15–41)
Albumin: 3.8 g/dL (ref 3.5–5.0)
Alkaline Phosphatase: 40 U/L (ref 38–126)
Anion gap: 8 (ref 5–15)
BUN: 22 mg/dL (ref 8–23)
CO2: 26 mmol/L (ref 22–32)
Calcium: 9.6 mg/dL (ref 8.9–10.3)
Chloride: 104 mmol/L (ref 98–111)
Creatinine, Ser: 1.27 mg/dL — ABNORMAL HIGH (ref 0.61–1.24)
GFR, Estimated: 57 mL/min — ABNORMAL LOW (ref >60)
Glucose, Bld: 115 mg/dL — ABNORMAL HIGH (ref 70–99)
Potassium: 4.3 mmol/L (ref 3.5–5.1)
Sodium: 138 mmol/L (ref 135–145)
Total Bilirubin: 1.6 mg/dL — ABNORMAL HIGH (ref 0.3–1.2)
Total Protein: 6.4 g/dL — ABNORMAL LOW (ref 6.5–8.1)

## 2020-12-11 LAB — URINALYSIS, ROUTINE W REFLEX MICROSCOPIC
Bilirubin Urine: NEGATIVE
Glucose, UA: NEGATIVE mg/dL
Ketones, ur: NEGATIVE mg/dL
Leukocytes,Ua: NEGATIVE
Nitrite: NEGATIVE
Protein, ur: NEGATIVE mg/dL
Specific Gravity, Urine: 1.021 (ref 1.005–1.030)
pH: 7 (ref 5.0–8.0)

## 2020-12-11 LAB — LIPASE, BLOOD: Lipase: <10 U/L — ABNORMAL LOW (ref 11–51)

## 2020-12-11 LAB — PROTIME-INR
INR: 1.9 — ABNORMAL HIGH (ref 0.8–1.2)
Prothrombin Time: 21.3 seconds — ABNORMAL HIGH (ref 11.4–15.2)

## 2020-12-11 LAB — LACTIC ACID, PLASMA: Lactic Acid, Venous: 1.1 mmol/L (ref 0.5–1.9)

## 2020-12-11 MED ORDER — METRONIDAZOLE IN NACL 5-0.79 MG/ML-% IV SOLN
500.0000 mg | Freq: Once | INTRAVENOUS | Status: AC
  Administered 2020-12-11: 500 mg via INTRAVENOUS
  Filled 2020-12-11: qty 100

## 2020-12-11 MED ORDER — LEVOFLOXACIN IN D5W 750 MG/150ML IV SOLN
750.0000 mg | Freq: Once | INTRAVENOUS | Status: DC
  Filled 2020-12-11: qty 150

## 2020-12-11 MED ORDER — AMOXICILLIN-POT CLAVULANATE 875-125 MG PO TABS
1.0000 | ORAL_TABLET | Freq: Once | ORAL | Status: AC
  Administered 2020-12-11: 1 via ORAL
  Filled 2020-12-11: qty 1

## 2020-12-11 MED ORDER — AMOXICILLIN-POT CLAVULANATE 875-125 MG PO TABS: 1.0000 | ORAL_TABLET | Freq: Two times a day (BID) | ORAL | 0 refills | Status: DC

## 2020-12-11 MED ORDER — METRONIDAZOLE 500 MG PO TABS: 500.0000 mg | ORAL_TABLET | Freq: Two times a day (BID) | ORAL | 0 refills | Status: DC

## 2020-12-11 MED ORDER — MORPHINE SULFATE (PF) 4 MG/ML IV SOLN
4.0000 mg | Freq: Once | INTRAVENOUS | Status: DC
  Filled 2020-12-11: qty 1

## 2020-12-11 MED ORDER — LEVOFLOXACIN 500 MG PO TABS: 500.0000 mg | ORAL_TABLET | Freq: Every day | ORAL | 0 refills | Status: DC

## 2020-12-11 MED ORDER — LACTATED RINGERS IV SOLN: INTRAVENOUS | Status: DC

## 2020-12-11 MED ORDER — IOHEXOL 300 MG/ML  SOLN
100.0000 mL | Freq: Once | INTRAMUSCULAR | Status: AC | PRN
  Administered 2020-12-11: 100 mL via INTRAVENOUS

## 2020-12-11 MED ORDER — LACTATED RINGERS IV BOLUS
500.0000 mL | Freq: Once | INTRAVENOUS | Status: AC
  Administered 2020-12-11: 500 mL via INTRAVENOUS

## 2020-12-11 MED ORDER — TRAMADOL HCL 50 MG PO TABS
50.0000 mg | ORAL_TABLET | Freq: Once | ORAL | Status: AC
  Administered 2020-12-11: 50 mg via ORAL
  Filled 2020-12-11: qty 1

## 2020-12-11 MED ORDER — TRAMADOL HCL 50 MG PO TABS: ORAL_TABLET | ORAL | 0 refills | Status: DC

## 2020-12-11 MED ORDER — ONDANSETRON HCL 4 MG/2ML IJ SOLN
4.0000 mg | Freq: Once | INTRAMUSCULAR | Status: DC
  Filled 2020-12-11: qty 2

## 2020-12-11 NOTE — ED Triage Notes (Signed)
He c/o left groin pain x 2-3 days. He also tells me his urine is "darker than usual". He is ambulatory and in no distress.

## 2020-12-11 NOTE — Discharge Instructions (Signed)
1.  Try to stay hydrated and drink small amounts of fluids throughout the day. 2.  Start taking your Augmentin tomorrow around noon.  Take this medication twice daily.  Tomorrow you may take your evening dose late in the evening then start a morning and evening regimen on the following day. 3.  Take tramadol as needed for pain. 4.  You need a close follow-up recheck with your doctor within 2 to 3 days.  Return to emergency department immediately if you are getting a fever, worsening pain, vomiting or difficulty taking your medications or other concerning symptoms.

## 2020-12-11 NOTE — ED Provider Notes (Signed)
[facility] EMERGENCY DEPT Provider Note   CSN: [id number] Arrival date & time: 12/11/20  1633     History No chief complaint on file.   [name] is a 80 y.o. male.  HPI Patient reports for about 3 days he has been getting some pains in his abdomen.  It seems to be more towards the left lower abdomen.  Is aching and cramping in quality.  No fever.  No vomiting no diarrhea.  No blood in the stool patient reports he has noted his urine to be darker.  No pain burning urgency urination.  No testicular pain patient denies prior history of similar episodes.  No pain into the back.    Past Medical History:  Diagnosis Date  . Allergic rhinitis    takes Allegra daily  . Allergic rhinitis, cause unspecified 06/22/2012  . Arthritis   . Atrial fibrillation ([place])    takes Tikosyn and Eliquis daily  . 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 Coronary artery disease    LAD stenting 2004 non-DES  . Depression    took Zoloft 63yrs ago but nothing now  . Diverticulosis   . DVT (deep venous thrombosis) ([place])   . Dysphagia    occasionally  . History of blood clots 2003   left leg prior to fem pop  . History of colon polyps   . Hyperlipidemia    takes Tricor and Lipitor daily  . Insomnia    takes Ambien nightly as needed  . Joint pain   . Joint swelling   . 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 OSA (obstructive sleep apnea)    CPAP  . Pacemaker   . Peripheral vascular disease ([place])   . Pleurisy    early 64's  . Prostatitis   . Urinary urgency     Patient Active Problem List   Diagnosis Date Noted  . Eustachian tube dysfunction 06/27/2020  . CKD (chronic kidney disease) stage 3, GFR 30-59 ml/min (HCC) 06/27/2020  . Vertigo 06/27/2020  . Olecranon bursitis, right elbow 06/27/2020  . Sinus node dysfunction ([place]) 09/10/2019  . LBBB (left bundle branch block) 09/10/2019  . Hyperglycemia 04/19/2016  . Chronic systolic heart failure  ([place]) 05/05/2015  . Insomnia 04/07/2015  . Aftercare following surgery of the circulatory system, [place] 04/24/2014  . Degenerative joint disease (DJD) of hip 12/09/2013  . 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 Osteoarthritis of left hip 11/05/2013  . Chronic left sacroiliac joint pain 10/13/2013  . Discoloration of skin of lower leg-Left 04/11/2013  . Allergic rhinitis 06/22/2012  . Encounter for well adult exam with abnormal findings 06/21/2012  . Chronic sinusitis 06/21/2012  . Aneurysm artery, popliteal ([place]) 01/30/2012  . Erectile dysfunction 02/21/2011  . 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 CERVICAL RADICULOPATHY, LEFT 05/31/2010  . Atrial fibrillation ([place]) 05/11/2009  . 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 LATERAL EPICONDYLITIS, RIGHT 02/02/2009  . HYPERLIPIDEMIA 01/07/2008  . DEPRESSION 01/07/2008  . OBSTRUCTIVE SLEEP APNEA 01/07/2008  . Peripheral vascular disease ([place]) 01/07/2008  . DIVERTICULOSIS, COLON 01/07/2008    Past Surgical History:  Procedure Laterality Date  . CARDIAC CATHETERIZATION  2004  . CARDIAC CATHETERIZATION N/A 05/06/2015   Procedure: Left Heart Cath and Coronary Angiography;  Surgeon: Belva Crome, MD;  Location: [facility];  Service: Cardiovascular;  Laterality: N/A;  . COLONOSCOPY    . hand and arm surgery Right    as a teenager  . HAND  SURGERY Left   . INGUINAL HERNIA REPAIR Right   . JOINT REPLACEMENT Left December 09, 2013   Left Hip   . JOINT REPLACEMENT Right Aug. 25, 2015   Right Hip  . left knee surgery    . PACEMAKER INSERTION     due to bradycardia  . PERMANENT PACEMAKER GENERATOR CHANGE N/A 06/17/2012   Procedure: PERMANENT PACEMAKER GENERATOR CHANGE;  Surgeon: Deboraha Sprang, MD;  Location: [facility] CATH LAB;  Service: Cardiovascular;  Laterality: N/A;  . right and left fem-pop bypass    . stent (unspec)     x2  . TOTAL HIP ARTHROPLASTY Left 12/09/2013   Procedure: TOTAL HIP ARTHROPLASTY ANTERIOR APPROACH;  Surgeon: Hessie Dibble,  MD;  Location: [facility];  Service: Orthopedics;  Laterality: Left;  left anterior total hip arthroplasty  . TOTAL HIP ARTHROPLASTY Right 05/05/2014   Procedure: TOTAL HIP ARTHROPLASTY ANTERIOR APPROACH;  Surgeon: Hessie Dibble, MD;  Location: [facility];  Service: Orthopedics;  Laterality: Right;  . TURBINATE REDUCTION    . wisdom extracted          Family History  Problem Relation Age of Onset  . Diabetes Other        family hx  . Sleep apnea Other        family hx  . Diabetes Mother   . Heart disease Mother        Before age 32  . Heart disease Father        Before age 49  . Heart attack Father   . Stroke Father   . Aneurysm Father        bilat pop art aneurysms  . Peripheral vascular disease Father        Popliteal Aneurysm  . Allergic rhinitis Neg Hx   . Angioedema Neg Hx   . Asthma Neg Hx   . Eczema Neg Hx   . Immunodeficiency Neg Hx   . Urticaria Neg Hx     Social History   Tobacco Use  . Smoking status: Never Smoker  . Smokeless tobacco: Never Used  Vaping Use  . Vaping Use: Never used  Substance Use Topics  . Alcohol use: No    Alcohol/week: 0.0 standard drinks    Comment: nothing since 2000  . Drug use: No    Home Medications Prior to Admission medications   Medication Sig Start Date End Date Taking? Authorizing Provider  amoxicillin-clavulanate (AUGMENTIN) 875-125 MG tablet Take 1 tablet by mouth 2 (two) times daily. One po bid x 7 days 12/11/20  Yes Mason Dibiasio, Jeannie Done, MD  traMADol Veatrice Bourbon) 50 MG tablet 1-2 tablets every 6 hours as needed for pain 12/11/20  Yes Powell Halbert, Jeannie Done, MD  atorvastatin (LIPITOR) 80 MG tablet TAKE 1 TABLET BY MOUTH  DAILY 09/23/20   Deboraha Sprang, MD  CALCIUM PO Take 600 mg by mouth 2 (two) times daily.    [provider]  carvedilol (COREG) 25 MG tablet TAKE 2 TABLETS BY MOUTH  TWICE DAILY 09/23/20   Deboraha Sprang, MD  Cholecalciferol (D3 VITAMIN PO) Take 4,000 Int'l Units by mouth daily.    [provider]  Coenzyme  Q10 (COQ-10 PO) Take 300 mg by mouth daily.    [provider]  Cyanocobalamin (VITAMIN B 12 PO) Take 1 tablet by mouth daily.    [provider]  dofetilide (TIKOSYN) 500 MCG capsule TAKE 1 CAPSULE BY MOUTH  TWICE DAILY 09/23/20   Deboraha Sprang, MD  ELIQUIS 5 MG TABS tablet TAKE 1 TABLET BY MOUTH  TWICE DAILY 09/23/20   Deboraha Sprang, MD  fenofibrate 160 MG tablet TAKE 1 TABLET BY MOUTH  DAILY 09/23/20   Deboraha Sprang, MD  fexofenadine (ALLEGRA) 180 MG tablet Take 180 mg by mouth daily.    [provider]  Fluocinonide Emulsified Base 0.05 % CREA APPLY TO AFFECTED AREA(S)  TWO TIMES DAILY AS NEEDED 01/16/19   Biagio Borg, MD  FOLIC ACID PO Take 1 tablet by mouth daily.    [provider]  GLUCOSAMINE CHONDROITIN COMPLX PO Take 1 tablet by mouth 2 (two) times daily.     [provider]  losartan (COZAAR) 25 MG tablet TAKE 1 TABLET BY MOUTH  DAILY 09/23/20   Deboraha Sprang, MD  Lysine 500 MG TABS Take 500 mg by mouth 3 (three) times daily.    [provider]  Magnesium 250 MG TABS Take 250 mg by mouth 2 (two) times daily.     [provider]  meclizine (ANTIVERT) 12.5 MG tablet Take 1 tablet (12.5 mg total) by mouth 3 (three) times daily as needed for dizziness. 07/05/20 07/05/21  Biagio Borg, MD  Omega-3 Fatty Acids (FISH OIL) 1200 MG CAPS Take 1,200 mg by mouth daily.     [provider]  potassium chloride (KLOR-CON) 10 MEQ tablet TAKE 1 TABLET BY MOUTH  TWICE DAILY 09/23/20   Deboraha Sprang, MD  ranitidine (ZANTAC) 300 MG tablet Take 1 tablet (300 mg total) by mouth at bedtime. 03/08/17   Kozlow, Donnamarie Poag, MD  ranolazine (RANEXA) 500 MG 12 hr tablet Take 1 tablet (500 mg total) by mouth 2 (two) times daily. 06/01/20   Deboraha Sprang, MD  triamcinolone (NASACORT) 55 MCG/ACT AERO nasal inhaler Place 2 sprays into both nostrils daily.     [provider]    Allergies    Amiodarone, Niacin, Ace inhibitors, and  Mometasone furoate  Review of Systems   Review of Systems 10 systems reviewed and negative except as per HPI Physical Exam Updated Vital Signs BP (!) 121/100 (BP Location: Left Arm)   Pulse 61   Temp 98.8 F (37.1 C) (Oral)   Resp 18   Ht 6\' 3"  (1.905 m)   Wt 93.4 kg   SpO2 96%   BMI 25.75 kg/m   Physical Exam Constitutional:      Appearance: He is well-developed.  HENT:     Head: Normocephalic and atraumatic.  Eyes:     Extraocular Movements: Extraocular movements intact.     Conjunctiva/sclera: Conjunctivae normal.  Cardiovascular:     Rate and Rhythm: Normal rate and regular rhythm.     Heart sounds: Normal heart sounds.  Pulmonary:     Effort: Pulmonary effort is normal.     Breath sounds: Normal breath sounds.  Abdominal:     General: Bowel sounds are normal. There is no distension.     Palpations: Abdomen is soft.     Tenderness: There is abdominal tenderness.     Comments: Right lower quadrant tender to palpation.  No guarding.  No mass fullness or tenderness in the groin.  Musculoskeletal:        General: Normal range of motion.     Cervical back: Neck supple.  Skin:    General: Skin is warm and dry.  Neurological:     Mental Status: He is alert and oriented to person, place, and time.  GCS: GCS eye subscore is 4. GCS verbal subscore is 5. GCS motor subscore is 6.     Coordination: Coordination normal.     ED Results / Procedures / Treatments   Labs (all labs ordered are listed, but only abnormal results are displayed) Labs Reviewed  URINALYSIS, ROUTINE W REFLEX MICROSCOPIC - Abnormal; Notable for the following components:      Result Value   Hgb urine dipstick TRACE (*)    All other components within normal limits  COMPREHENSIVE METABOLIC PANEL - Abnormal; Notable for the following components:   Glucose, Bld 115 (*)    Creatinine, Ser 1.27 (*)    Total Protein 6.4 (*)    Total Bilirubin 1.6 (*)    GFR, Estimated 57 (*)    All other components  within normal limits  LIPASE, BLOOD - Abnormal; Notable for the following components:   Lipase <10 (*)    All other components within normal limits  CBC WITH DIFFERENTIAL/PLATELET - Abnormal; Notable for the following components:   WBC 11.5 (*)    RBC 3.99 (*)    Platelets 111 (*)    Neutro Abs 9.6 (*)    Abs Immature Granulocytes 0.08 (*)    All other components within normal limits  PROTIME-INR - Abnormal; Notable for the following components:   Prothrombin Time 21.3 (*)    INR 1.9 (*)    All other components within normal limits  LACTIC ACID, PLASMA  LACTIC ACID, PLASMA    EKG None  Radiology CT Abdomen Pelvis W Contrast  Result Date: 12/11/2020 CLINICAL DATA:  Left groin pain EXAM: CT ABDOMEN AND PELVIS WITH CONTRAST TECHNIQUE: Multidetector CT imaging of the abdomen and pelvis was performed using the standard protocol following bolus administration of intravenous contrast. CONTRAST:  11mL OMNIPAQUE IOHEXOL 300 MG/ML  SOLN COMPARISON:  None. FINDINGS: Lower chest: Cardiomegaly. Pacer wires noted in the right heart. Diffuse calcifications in the visualized right coronary artery. Hepatobiliary: No focal hepatic abnormality. Gallbladder unremarkable. Pancreas: No focal abnormality or ductal dilatation. Spleen: No focal abnormality.  Normal size. Adrenals/Urinary Tract: Left renal parapelvic cysts and small cyst in the lower pole of the left kidney. No hydronephrosis. Adrenal glands and urinary bladder unremarkable. Stomach/Bowel: Scattered diverticulosis, most pronounced in the descending and sigmoid colon. Inflammatory stranding around the proximal sigmoid colon compatible with active diverticulitis. Stomach and small bowel decompressed. Normal appendix. Vascular/Lymphatic: Aortoiliac atherosclerosis. No evidence of aneurysm or adenopathy. Reproductive: Central prostate calcifications. Other: No free fluid or free air. Musculoskeletal: Bilateral hip replacements. No acute bony abnormality.  IMPRESSION: Colonic diverticulosis. Changes of active diverticulitis around the proximal sigmoid colon. Coronary artery disease, aortic atherosclerosis. Electronically Signed   By: Rolm Baptise M.D.   On: 12/11/2020 21:05    Procedures Procedures   Medications Ordered in ED Medications  lactated ringers infusion ( Intravenous Stopped 12/11/20 2200)  morphine 4 MG/ML injection 4 mg (4 mg Intravenous Patient Refused/Not Given 12/11/20 1947)  ondansetron (ZOFRAN) injection 4 mg (4 mg Intravenous Patient Refused/Not Given 12/11/20 1947)  amoxicillin-clavulanate (AUGMENTIN) 875-125 MG per tablet 1 tablet (has no administration in time range)  iohexol (OMNIPAQUE) 300 MG/ML solution 100 mL (100 mLs Intravenous Contrast Given 12/11/20 2042)  metroNIDAZOLE (FLAGYL) IVPB 500 mg (0 mg Intravenous Stopped 12/11/20 2342)  traMADol (ULTRAM) tablet 50 mg (50 mg Oral Given 12/11/20 2233)  lactated ringers bolus 500 mL (0 mLs Intravenous Stopped 12/11/20 2343)    ED Course  I have reviewed the triage vital signs and  the nursing notes.  Pertinent labs & imaging results that were available during my care of the patient were reviewed by me and considered in my medical decision making (see chart for details).    MDM Rules/Calculators/A&P                         Patient presents with left lower abdominal pain.  Is been waxing and waning over 3 days been getting more severe.  CT shows evidence of diverticulitis.  Patient has mild leukocytosis and normal lactic acid.  No fever.  No tachycardia.  He has not had any vomiting.  At this time, will plan for outpatient treatment. Patient is discharged in good condition.  He is alert and nontoxic.  Ambulatory without difficulty.  Vital signs stable.  Patient is counseled to have a recheck within 2 days.  Return to the emergency department if any worsening or changing symptoms. Final Clinical Impression(s) / ED Diagnoses Final diagnoses:  Diverticulitis    Rx / DC Orders ED  Discharge Orders         Ordered    metroNIDAZOLE (FLAGYL) 500 MG tablet  2 times daily,   Status:  Discontinued        12/11/20 2326    levofloxacin (LEVAQUIN) 500 MG tablet  Daily,   Status:  Discontinued        12/11/20 2326    traMADol (ULTRAM) 50 MG tablet        12/11/20 2326    amoxicillin-clavulanate (AUGMENTIN) 875-125 MG tablet  2 times daily        12/11/20 2329           Charlesetta Shanks, MD 12/11/20 2349

## 2020-12-13 ENCOUNTER — Encounter: Payer: Medicare Other | Admitting: Internal Medicine

## 2020-12-13 DIAGNOSIS — I495 Sick sinus syndrome: Secondary | ICD-10-CM

## 2020-12-13 DIAGNOSIS — I480 Paroxysmal atrial fibrillation: Secondary | ICD-10-CM

## 2020-12-13 DIAGNOSIS — I5022 Chronic systolic (congestive) heart failure: Secondary | ICD-10-CM

## 2020-12-13 DIAGNOSIS — I447 Left bundle-branch block, unspecified: Secondary | ICD-10-CM

## 2020-12-13 DIAGNOSIS — Z950 Presence of cardiac pacemaker: Secondary | ICD-10-CM

## 2020-12-15 ENCOUNTER — Encounter: Payer: Self-pay | Admitting: Internal Medicine

## 2020-12-15 ENCOUNTER — Telehealth: Payer: Self-pay | Admitting: Internal Medicine

## 2020-12-15 ENCOUNTER — Emergency Department (HOSPITAL_COMMUNITY): Payer: Medicare Other

## 2020-12-15 ENCOUNTER — Other Ambulatory Visit: Payer: Self-pay

## 2020-12-15 ENCOUNTER — Inpatient Hospital Stay (HOSPITAL_COMMUNITY)
Admission: EM | Admit: 2020-12-15 | Discharge: 2020-12-25 | DRG: 871 | Disposition: A | Discharge status: Home Health | Payer: Medicare Other | Attending: Internal Medicine | Admitting: Internal Medicine

## 2020-12-15 DIAGNOSIS — G4733 Obstructive sleep apnea (adult) (pediatric): Secondary | ICD-10-CM | POA: Diagnosis not present

## 2020-12-15 DIAGNOSIS — A419 Sepsis, unspecified organism: Secondary | ICD-10-CM | POA: Diagnosis not present

## 2020-12-15 DIAGNOSIS — I495 Sick sinus syndrome: Secondary | ICD-10-CM | POA: Diagnosis present

## 2020-12-15 DIAGNOSIS — E785 Hyperlipidemia, unspecified: Secondary | ICD-10-CM | POA: Diagnosis present

## 2020-12-15 DIAGNOSIS — I2510 Atherosclerotic heart disease of native coronary artery without angina pectoris: Secondary | ICD-10-CM | POA: Diagnosis present

## 2020-12-15 DIAGNOSIS — I420 Dilated cardiomyopathy: Secondary | ICD-10-CM | POA: Diagnosis present

## 2020-12-15 DIAGNOSIS — K567 Ileus, unspecified: Secondary | ICD-10-CM | POA: Diagnosis present

## 2020-12-15 DIAGNOSIS — Z6827 Body mass index (BMI) 27.0-27.9, adult: Secondary | ICD-10-CM

## 2020-12-15 DIAGNOSIS — Z1611 Resistance to penicillins: Secondary | ICD-10-CM | POA: Diagnosis present

## 2020-12-15 DIAGNOSIS — L0291 Cutaneous abscess, unspecified: Secondary | ICD-10-CM

## 2020-12-15 DIAGNOSIS — I4891 Unspecified atrial fibrillation: Secondary | ICD-10-CM | POA: Diagnosis not present

## 2020-12-15 DIAGNOSIS — R0602 Shortness of breath: Secondary | ICD-10-CM | POA: Diagnosis not present

## 2020-12-15 DIAGNOSIS — I739 Peripheral vascular disease, unspecified: Secondary | ICD-10-CM | POA: Diagnosis present

## 2020-12-15 DIAGNOSIS — N179 Acute kidney failure, unspecified: Secondary | ICD-10-CM | POA: Diagnosis present

## 2020-12-15 DIAGNOSIS — Z978 Presence of other specified devices: Secondary | ICD-10-CM | POA: Diagnosis not present

## 2020-12-15 DIAGNOSIS — Z950 Presence of cardiac pacemaker: Secondary | ICD-10-CM | POA: Diagnosis not present

## 2020-12-15 DIAGNOSIS — K631 Perforation of intestine (nontraumatic): Secondary | ICD-10-CM

## 2020-12-15 DIAGNOSIS — R6520 Severe sepsis without septic shock: Secondary | ICD-10-CM | POA: Diagnosis present

## 2020-12-15 DIAGNOSIS — K5720 Diverticulitis of large intestine with perforation and abscess without bleeding: Secondary | ICD-10-CM | POA: Diagnosis not present

## 2020-12-15 DIAGNOSIS — K5792 Diverticulitis of intestine, part unspecified, without perforation or abscess without bleeding: Secondary | ICD-10-CM | POA: Diagnosis not present

## 2020-12-15 DIAGNOSIS — K651 Peritoneal abscess: Secondary | ICD-10-CM | POA: Diagnosis not present

## 2020-12-15 DIAGNOSIS — Z96643 Presence of artificial hip joint, bilateral: Secondary | ICD-10-CM | POA: Diagnosis not present

## 2020-12-15 DIAGNOSIS — I447 Left bundle-branch block, unspecified: Secondary | ICD-10-CM | POA: Diagnosis present

## 2020-12-15 DIAGNOSIS — N1831 Chronic kidney disease, stage 3a: Secondary | ICD-10-CM | POA: Diagnosis not present

## 2020-12-15 DIAGNOSIS — I5042 Chronic combined systolic (congestive) and diastolic (congestive) heart failure: Secondary | ICD-10-CM | POA: Diagnosis present

## 2020-12-15 DIAGNOSIS — I5021 Acute systolic (congestive) heart failure: Secondary | ICD-10-CM | POA: Diagnosis not present

## 2020-12-15 DIAGNOSIS — Z1624 Resistance to multiple antibiotics: Secondary | ICD-10-CM | POA: Diagnosis not present

## 2020-12-15 DIAGNOSIS — Z7901 Long term (current) use of anticoagulants: Secondary | ICD-10-CM

## 2020-12-15 DIAGNOSIS — A4151 Sepsis due to Escherichia coli [E. coli]: Secondary | ICD-10-CM | POA: Diagnosis present

## 2020-12-15 DIAGNOSIS — Z86718 Personal history of other venous thrombosis and embolism: Secondary | ICD-10-CM

## 2020-12-15 DIAGNOSIS — R000 Tachycardia, unspecified: Secondary | ICD-10-CM | POA: Diagnosis not present

## 2020-12-15 DIAGNOSIS — I4892 Unspecified atrial flutter: Secondary | ICD-10-CM | POA: Diagnosis present

## 2020-12-15 DIAGNOSIS — K5730 Diverticulosis of large intestine without perforation or abscess without bleeding: Secondary | ICD-10-CM | POA: Diagnosis not present

## 2020-12-15 DIAGNOSIS — Z955 Presence of coronary angioplasty implant and graft: Secondary | ICD-10-CM | POA: Diagnosis not present

## 2020-12-15 DIAGNOSIS — Z01810 Encounter for preprocedural cardiovascular examination: Secondary | ICD-10-CM | POA: Diagnosis not present

## 2020-12-15 DIAGNOSIS — D72828 Other elevated white blood cell count: Secondary | ICD-10-CM | POA: Diagnosis not present

## 2020-12-15 DIAGNOSIS — Z79899 Other long term (current) drug therapy: Secondary | ICD-10-CM

## 2020-12-15 DIAGNOSIS — G629 Polyneuropathy, unspecified: Secondary | ICD-10-CM | POA: Diagnosis present

## 2020-12-15 DIAGNOSIS — B9620 Unspecified Escherichia coli [E. coli] as the cause of diseases classified elsewhere: Secondary | ICD-10-CM | POA: Diagnosis not present

## 2020-12-15 DIAGNOSIS — E43 Unspecified severe protein-calorie malnutrition: Secondary | ICD-10-CM | POA: Diagnosis present

## 2020-12-15 DIAGNOSIS — R140 Abdominal distension (gaseous): Secondary | ICD-10-CM

## 2020-12-15 DIAGNOSIS — I480 Paroxysmal atrial fibrillation: Secondary | ICD-10-CM | POA: Diagnosis not present

## 2020-12-15 DIAGNOSIS — Z20822 Contact with and (suspected) exposure to covid-19: Secondary | ICD-10-CM | POA: Diagnosis not present

## 2020-12-15 DIAGNOSIS — I130 Hypertensive heart and chronic kidney disease with heart failure and stage 1 through stage 4 chronic kidney disease, or unspecified chronic kidney disease: Secondary | ICD-10-CM | POA: Diagnosis present

## 2020-12-15 DIAGNOSIS — Z1623 Resistance to quinolones and fluoroquinolones: Secondary | ICD-10-CM | POA: Diagnosis not present

## 2020-12-15 DIAGNOSIS — R109 Unspecified abdominal pain: Secondary | ICD-10-CM | POA: Diagnosis not present

## 2020-12-15 DIAGNOSIS — J9811 Atelectasis: Secondary | ICD-10-CM | POA: Diagnosis not present

## 2020-12-15 DIAGNOSIS — Z9989 Dependence on other enabling machines and devices: Secondary | ICD-10-CM | POA: Diagnosis not present

## 2020-12-15 LAB — COMPREHENSIVE METABOLIC PANEL
ALT: 35 U/L (ref 0–44)
AST: 44 U/L — ABNORMAL HIGH (ref 15–41)
Albumin: 3.1 g/dL — ABNORMAL LOW (ref 3.5–5.0)
Alkaline Phosphatase: 83 U/L (ref 38–126)
Anion gap: 11 (ref 5–15)
BUN: 46 mg/dL — ABNORMAL HIGH (ref 8–23)
CO2: 20 mmol/L — ABNORMAL LOW (ref 22–32)
Calcium: 10.3 mg/dL (ref 8.9–10.3)
Chloride: 105 mmol/L (ref 98–111)
Creatinine, Ser: 2.13 mg/dL — ABNORMAL HIGH (ref 0.61–1.24)
GFR, Estimated: 31 mL/min — ABNORMAL LOW (ref >60)
Glucose, Bld: 133 mg/dL — ABNORMAL HIGH (ref 70–99)
Potassium: 4.9 mmol/L (ref 3.5–5.1)
Sodium: 136 mmol/L (ref 135–145)
Total Bilirubin: 1.5 mg/dL — ABNORMAL HIGH (ref 0.3–1.2)
Total Protein: 6.5 g/dL (ref 6.5–8.1)

## 2020-12-15 LAB — CBC
HCT: 44.2 % (ref 39.0–52.0)
Hemoglobin: 14.5 g/dL (ref 13.0–17.0)
MCH: 32.4 pg (ref 26.0–34.0)
MCHC: 32.8 g/dL (ref 30.0–36.0)
MCV: 98.9 fL (ref 80.0–100.0)
Platelets: 269 10*3/uL (ref 150–400)
RBC: 4.47 MIL/uL (ref 4.22–5.81)
RDW: 14.6 % (ref 11.5–15.5)
WBC: 15.8 10*3/uL — ABNORMAL HIGH (ref 4.0–10.5)
nRBC: 0 % (ref 0.0–0.2)

## 2020-12-15 LAB — URINALYSIS, ROUTINE W REFLEX MICROSCOPIC
Bilirubin Urine: NEGATIVE
Glucose, UA: NEGATIVE mg/dL
Hgb urine dipstick: NEGATIVE
Ketones, ur: 5 mg/dL — AB
Nitrite: NEGATIVE
Protein, ur: NEGATIVE mg/dL
Specific Gravity, Urine: 1.024 (ref 1.005–1.030)
pH: 5 (ref 5.0–8.0)

## 2020-12-15 LAB — LIPASE, BLOOD: Lipase: 22 U/L (ref 11–51)

## 2020-12-15 MED ORDER — PIPERACILLIN-TAZOBACTAM 3.375 G IVPB 30 MIN
3.3750 g | Freq: Once | INTRAVENOUS | Status: AC
  Administered 2020-12-15: 3.375 g via INTRAVENOUS
  Filled 2020-12-15: qty 50

## 2020-12-15 MED ORDER — DOFETILIDE 500 MCG PO CAPS
500.0000 ug | ORAL_CAPSULE | Freq: Two times a day (BID) | ORAL | Status: DC
  Administered 2020-12-16 (×2): 500 ug via ORAL
  Filled 2020-12-15 (×5): qty 1

## 2020-12-15 MED ORDER — LACTATED RINGERS IV BOLUS
1000.0000 mL | Freq: Once | INTRAVENOUS | Status: AC
  Administered 2020-12-16: 1000 mL via INTRAVENOUS

## 2020-12-15 NOTE — Telephone Encounter (Signed)
Yes, that is a good idea as the pain should be MUCH improved by now if the antibiotics are working, and there are no complications such as perforation  Please go to Ed now

## 2020-12-15 NOTE — Telephone Encounter (Signed)
Patients wife called and said that the patient was diagnosed with diverticulitis on Saturday. She said that he was prescribed a few medications. She said that he is still in a lot of pain and bloated. The patient said that he would go back to the hospital if that is what Dr. Jenny Reichmann advised. She can be reached at [phone]. Please advise

## 2020-12-15 NOTE — Telephone Encounter (Signed)
See below

## 2020-12-15 NOTE — ED Provider Notes (Signed)
[facility] EMERGENCY DEPARTMENT Provider Note   CSN: [id number] Arrival date & time: 12/15/20  1500     History Chief Complaint  Patient presents with  . Abdominal Pain    [name] is a 80 y.o. male past medical history significant for his CAD, diverticulitis, A. fib on Tikosyn and Eliquis, DVT, pacemaker for sinus node dysfunction Mild CHF last EF 40-45% 11/24/2020   HPI Was dx with divertic on Saturday when sx began and was DC-ed from ER on augmentin. Better on Sunday then worse Monday/tuesday with vomiting today and fatigue and worsening pain. Seems to be diffuse lower abdominal pain. States it is moderate pain presently but only really hurts when pushing on his belly. No radiation of pain. Denies fever. States he had a big diarrheal BM Monday and felt worse after that.  No other associated fever, No CP, sob, LH dizziness.   His last dose of DOAC anticoag was last PM.       Past Medical History:  Diagnosis Date  . Allergic rhinitis    takes Allegra daily  . Allergic rhinitis, cause unspecified 06/22/2012  . Arthritis   . Atrial fibrillation ([place])    takes Tikosyn and Eliquis daily  . 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 Coronary artery disease    LAD stenting 2004 non-DES  . Depression    took Zoloft 32yrs ago but nothing now  . Diverticulosis   . DVT (deep venous thrombosis) ([place])   . Dysphagia    occasionally  . History of blood clots 2003   left leg prior to fem pop  . History of colon polyps   . Hyperlipidemia    takes Tricor and Lipitor daily  . Insomnia    takes Ambien nightly as needed  . Joint pain   . Joint swelling   . 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 OSA (obstructive sleep apnea)    CPAP  . Pacemaker   . Peripheral vascular disease ([place])   . Pleurisy    early 17's  . Prostatitis   . Urinary urgency     Patient Active Problem List   Diagnosis Date Noted  . Diverticulitis of colon  with perforation 12/15/2020  . Acute renal failure superimposed on stage 3a chronic kidney disease ([place]) 12/15/2020  . Severe sepsis ([place]) 12/15/2020  . Eustachian tube dysfunction 06/27/2020  . CKD (chronic kidney disease) stage 3, GFR 30-59 ml/min (HCC) 06/27/2020  . Vertigo 06/27/2020  . Olecranon bursitis, right elbow 06/27/2020  . Sinus node dysfunction ([place]) 09/10/2019  . LBBB (left bundle branch block) 09/10/2019  . Hyperglycemia 04/19/2016  . Chronic systolic heart failure ([place]) 05/05/2015  . Insomnia 04/07/2015  . Aftercare following surgery of the circulatory system, [place] 04/24/2014  . Degenerative joint disease (DJD) of hip 12/09/2013  . 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 Osteoarthritis of left hip 11/05/2013  . Chronic left sacroiliac joint pain 10/13/2013  . Discoloration of skin of lower leg-Left 04/11/2013  . Allergic rhinitis 06/22/2012  . Encounter for well adult exam with abnormal findings 06/21/2012  . Chronic sinusitis 06/21/2012  . Aneurysm artery, popliteal ([place]) 01/30/2012  . Erectile dysfunction 02/21/2011  . 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 CERVICAL RADICULOPATHY, LEFT 05/31/2010  . Atrial fibrillation ([place]) 05/11/2009  . 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 LATERAL EPICONDYLITIS, RIGHT 02/02/2009  . HYPERLIPIDEMIA 01/07/2008  . DEPRESSION 01/07/2008  . OBSTRUCTIVE SLEEP APNEA 01/07/2008  . Peripheral vascular  disease ([place]) 01/07/2008  . DIVERTICULOSIS, COLON 01/07/2008    Past Surgical History:  Procedure Laterality Date  . CARDIAC CATHETERIZATION  2004  . CARDIAC CATHETERIZATION N/A 05/06/2015   Procedure: Left Heart Cath and Coronary Angiography;  Surgeon: Belva Crome, MD;  Location: [facility];  Service: Cardiovascular;  Laterality: N/A;  . COLONOSCOPY    . hand and arm surgery Right    as a teenager  . HAND SURGERY Left   . INGUINAL HERNIA REPAIR Right   . JOINT REPLACEMENT Left December 09, 2013   Left Hip   . JOINT REPLACEMENT  Right Aug. 25, 2015   Right Hip  . left knee surgery    . PACEMAKER INSERTION     due to bradycardia  . PERMANENT PACEMAKER GENERATOR CHANGE N/A 06/17/2012   Procedure: PERMANENT PACEMAKER GENERATOR CHANGE;  Surgeon: Deboraha Sprang, MD;  Location: [facility] CATH LAB;  Service: Cardiovascular;  Laterality: N/A;  . right and left fem-pop bypass    . stent (unspec)     x2  . TOTAL HIP ARTHROPLASTY Left 12/09/2013   Procedure: TOTAL HIP ARTHROPLASTY ANTERIOR APPROACH;  Surgeon: Hessie Dibble, MD;  Location: [facility];  Service: Orthopedics;  Laterality: Left;  left anterior total hip arthroplasty  . TOTAL HIP ARTHROPLASTY Right 05/05/2014   Procedure: TOTAL HIP ARTHROPLASTY ANTERIOR APPROACH;  Surgeon: Hessie Dibble, MD;  Location: [facility];  Service: Orthopedics;  Laterality: Right;  . TURBINATE REDUCTION    . wisdom extracted          Family History  Problem Relation Age of Onset  . Diabetes Other        family hx  . Sleep apnea Other        family hx  . Diabetes Mother   . Heart disease Mother        Before age 40  . Heart disease Father        Before age 17  . Heart attack Father   . Stroke Father   . Aneurysm Father        bilat pop art aneurysms  . Peripheral vascular disease Father        Popliteal Aneurysm  . Allergic rhinitis Neg Hx   . Angioedema Neg Hx   . Asthma Neg Hx   . Eczema Neg Hx   . Immunodeficiency Neg Hx   . Urticaria Neg Hx     Social History   Tobacco Use  . Smoking status: Never Smoker  . Smokeless tobacco: Never Used  Vaping Use  . Vaping Use: Never used  Substance Use Topics  . Alcohol use: No    Alcohol/week: 0.0 standard drinks    Comment: nothing since 2000  . Drug use: No    Home Medications Prior to Admission medications   Medication Sig Start Date End Date Taking? Authorizing Provider  amoxicillin-clavulanate (AUGMENTIN) 875-125 MG tablet Take 1 tablet by mouth 2 (two) times daily. One po bid x 7 days 12/11/20   Charlesetta Shanks, MD   atorvastatin (LIPITOR) 80 MG tablet TAKE 1 TABLET BY MOUTH  DAILY 09/23/20   Deboraha Sprang, MD  CALCIUM PO Take 600 mg by mouth 2 (two) times daily.    [provider]  carvedilol (COREG) 25 MG tablet TAKE 2 TABLETS BY MOUTH  TWICE DAILY 09/23/20   Deboraha Sprang, MD  Cholecalciferol (D3 VITAMIN PO) Take 4,000 Int'l Units by mouth daily.    [provider]  Coenzyme Q10 (COQ-10 PO) Take 300 mg by mouth daily.    [provider]  Cyanocobalamin (VITAMIN B 12 PO) Take 1 tablet by mouth daily.    [provider]  dofetilide (TIKOSYN) 500 MCG capsule TAKE 1 CAPSULE BY MOUTH  TWICE DAILY 09/23/20   Deboraha Sprang, MD  ELIQUIS 5 MG TABS tablet TAKE 1 TABLET BY MOUTH  TWICE DAILY 09/23/20   Deboraha Sprang, MD  fenofibrate 160 MG tablet TAKE 1 TABLET BY MOUTH  DAILY 09/23/20   Deboraha Sprang, MD  fexofenadine (ALLEGRA) 180 MG tablet Take 180 mg by mouth daily.    [provider]  Fluocinonide Emulsified Base 0.05 % CREA APPLY TO AFFECTED AREA(S)  TWO TIMES DAILY AS NEEDED 01/16/19   Biagio Borg, MD  FOLIC ACID PO Take 1 tablet by mouth daily.    [provider]  GLUCOSAMINE CHONDROITIN COMPLX PO Take 1 tablet by mouth 2 (two) times daily.     [provider]  losartan (COZAAR) 25 MG tablet TAKE 1 TABLET BY MOUTH  DAILY 09/23/20   Deboraha Sprang, MD  Lysine 500 MG TABS Take 500 mg by mouth 3 (three) times daily.    [provider]  Magnesium 250 MG TABS Take 250 mg by mouth 2 (two) times daily.     [provider]  Omega-3 Fatty Acids (FISH OIL) 1200 MG CAPS Take 1,200 mg by mouth daily.     [provider]  potassium chloride (KLOR-CON) 10 MEQ tablet TAKE 1 TABLET BY MOUTH  TWICE DAILY 09/23/20   Deboraha Sprang, MD  ranitidine (ZANTAC) 300 MG tablet Take 1 tablet (300 mg total) by mouth at bedtime. 03/08/17   Kozlow, Donnamarie Poag, MD  ranolazine (RANEXA) 500 MG 12 hr tablet Take 1 tablet (500 mg total) by mouth 2 (two)  times daily. 06/01/20   Deboraha Sprang, MD  traMADol Veatrice Bourbon) 50 MG tablet 1-2 tablets every 6 hours as needed for pain 12/11/20   Charlesetta Shanks, MD  triamcinolone (NASACORT) 55 MCG/ACT AERO nasal inhaler Place 2 sprays into both nostrils daily.     [provider]    Allergies    Amiodarone, Niacin, Ace inhibitors, and Mometasone furoate  Review of Systems   Review of Systems  Constitutional: Negative for chills and fever.  HENT: Negative for congestion.   Eyes: Negative for pain.  Respiratory: Negative for cough and shortness of breath.   Cardiovascular: Negative for chest pain and leg swelling.  Gastrointestinal: Positive for abdominal pain, diarrhea, nausea and vomiting.  Genitourinary: Negative for dysuria.  Musculoskeletal: Negative for myalgias.  Skin: Negative for rash.  Neurological: Negative for dizziness and headaches.    Physical Exam Updated Vital Signs BP 107/63   Pulse (!) 101   Temp (!) 97.5 F (36.4 C) (Oral)   Resp (!) 27   SpO2 96%   Physical Exam Vitals and nursing note reviewed.  Constitutional:      General: He is not in acute distress.    Comments: Pleasant in no acute distress  HENT:     Head: Normocephalic and atraumatic.     Nose: Nose normal.  Eyes:     General: No scleral icterus. Cardiovascular:     Rate and Rhythm: Regular rhythm. Tachycardia present.     Pulses: Normal pulses.     Heart sounds: Normal heart sounds.     Comments: HR 108 Pulmonary:     Effort: Pulmonary effort  is normal. No respiratory distress.     Breath sounds: No wheezing.  Abdominal:     Palpations: Abdomen is soft.     Tenderness: There is abdominal tenderness.     Comments: Lower abd TTP mild rebound. Not guarding.   Musculoskeletal:     Cervical back: Normal range of motion.     Right lower leg: No edema.     Left lower leg: No edema.  Skin:    General: Skin is warm and dry.     Capillary Refill: Capillary refill takes less than 2 seconds.   Neurological:     Mental Status: He is alert. Mental status is at baseline.  Psychiatric:        Mood and Affect: Mood normal.        Behavior: Behavior normal.     ED Results / Procedures / Treatments   Labs (all labs ordered are listed, but only abnormal results are displayed) Labs Reviewed  COMPREHENSIVE METABOLIC PANEL - Abnormal; Notable for the following components:      Result Value   CO2 20 (*)    Glucose, Bld 133 (*)    BUN 46 (*)    Creatinine, Ser 2.13 (*)    Albumin 3.1 (*)    AST 44 (*)    Total Bilirubin 1.5 (*)    GFR, Estimated 31 (*)    All other components within normal limits  CBC - Abnormal; Notable for the following components:   WBC 15.8 (*)    All other components within normal limits  URINALYSIS, ROUTINE W REFLEX MICROSCOPIC - Abnormal; Notable for the following components:   Color, Urine AMBER (*)    APPearance HAZY (*)    Ketones, ur 5 (*)    Leukocytes,Ua TRACE (*)    Bacteria, UA RARE (*)    All other components within normal limits  CULTURE, BLOOD (ROUTINE X 2)  CULTURE, BLOOD (ROUTINE X 2)  RESP PANEL BY RT-PCR (FLU A&B, COVID) ARPGX2  LIPASE, BLOOD  LACTIC ACID, PLASMA  LACTIC ACID, PLASMA  CK    EKG EKG Interpretation  Date/Time:  Wednesday December 15 2020 23:16:06 EDT Ventricular Rate:  98 PR Interval:    QRS Duration: 145 QT Interval:  404 QTC Calculation: 516 R Axis:   -51 Text Interpretation: Atrial flutter Ventricular premature complex Nonspecific IVCD with LAD LVH with secondary repolarization abnormality Anterior Q waves, possibly due to LVH Confirmed by Nanda Quinton [phone]) on 12/15/2020 11:22:30 PM   Radiology CT ABDOMEN PELVIS WO CONTRAST  Result Date: 12/15/2020 CLINICAL DATA:  Acute abdominal pain. Diagnosed with diverticulitis on Saturday and lower abdominal pain since then. EXAM: CT ABDOMEN AND PELVIS WITHOUT CONTRAST TECHNIQUE: Multidetector CT imaging of the abdomen and pelvis was performed following the standard  protocol without IV contrast. COMPARISON:  12/11/2020 FINDINGS: Lower chest: Mild dependent changes in the lung bases. Cardiac enlargement. Hepatobiliary: Probable hepatic cirrhosis with atrophy of the medial segment left lobe and nodular contour to the liver. Gallbladder and bile ducts are unremarkable. Pancreas: Diffuse fatty replacement of the pancreas. No inflammatory changes or ductal dilatation. Spleen: Normal in size without focal abnormality. Adrenals/Urinary Tract: Adrenal glands are unremarkable. Kidneys are normal, without renal calculi, focal lesion, or hydronephrosis. Bladder is unremarkable. Stomach/Bowel: Stomach is not abnormally distended. Significant progression of previous inflammatory process in the pelvis. This likely represents progression of the diverticulitis. There is extensive inflammatory stranding now seen throughout the pelvic fat with dilated fluid-filled small bowel, possibly due to  small-bowel obstruction or reactive change. There is new free intra-abdominal air consistent with perforation. No loculated abscess collection is identified. Vascular/Lymphatic: Aortic atherosclerosis. No enlarged abdominal or pelvic lymph nodes. Reproductive: Prostate gland is not enlarged. Other: Small amount of free fluid in the abdomen. Abdominal wall musculature appears intact. Musculoskeletal: Bilateral hip arthroplasties. Degenerative changes in the spine. IMPRESSION: 1. Significant progression of previous inflammatory process in the pelvis likely representing progression of the diverticulitis. New free intra-abdominal air consistent with perforation. Inflammatory stranding and edema throughout the pelvic fat. Developing small bowel dilatation with fluid-filled loops, probably reactive. No loculated abscess collection is identified. 2. Probable hepatic cirrhosis. 3. Small amount of free fluid in the abdomen. 4. Aortic atherosclerosis. Aortic Atherosclerosis (ICD10-I70.0). Critical Value/emergent  results were called by telephone at the time of interpretation on 12/15/2020 at 8:39 pm to provider [facility] , who verbally acknowledged these results. Electronically Signed   By: Lucienne Capers M.D.   On: 12/15/2020 20:44   DG Chest Portable 1 View  Result Date: 12/15/2020 CLINICAL DATA:  Shortness of breath.  Diverticulitis. EXAM: PORTABLE CHEST 1 VIEW COMPARISON:  04/19/2016 FINDINGS: Mild cardiac enlargement. Shallow inspiration with linear atelectasis in the lung bases. No consolidation or airspace disease. No pleural effusions. No pneumothorax. Cardiac pacemaker. IMPRESSION: Shallow inspiration with linear atelectasis in the lung bases. Electronically Signed   By: Lucienne Capers M.D.   On: 12/15/2020 23:22    Procedures Procedures   Medications Ordered in ED Medications  piperacillin-tazobactam (ZOSYN) IVPB 3.375 g (3.375 g Intravenous New Bag/Given 12/15/20 2338)  lactated ringers bolus 1,000 mL (has no administration in time range)  dofetilide (TIKOSYN) capsule 500 mcg (has no administration in time range)    ED Course  I have reviewed the triage vital signs and the nursing notes.  Pertinent labs & imaging results that were available during my care of the patient were reviewed by me and considered in my medical decision making (see chart for details).    MDM Rules/Calculators/A&P                          CT ordered in triage shows perforated diverticulitis -- Called by radiologist.   I requested pt be moved back to a room (charge nurse accomodated) -- 10:15 pt assessed by myself.   Pt mildly tachycardic has TTP lower abd; perforated Diverticulitis. Will hold DOAC, give zosyn, 1L crystaloid and cons gen surg -- gen surg is currently in trauma rec admit w abx.  CMP w/ elevated creatinine pt appears dehydrated -- fluids administered CBC w leukocytosis UA unremarkable but dark -- added on CK, lactic, blood cultures  Abx ordered.   Discussed with Dr. Myna Hidalgo who will see pt  and admit.   Final Clinical Impression(s) / ED Diagnoses Final diagnoses:  Diverticulitis  Perforation bowel [facility])    Rx / DC Orders ED Discharge Orders    None       Tedd Sias, [place] 12/16/20 0005    Margette Fast, MD 12/16/20 1540

## 2020-12-15 NOTE — Telephone Encounter (Signed)
Spoke with patient and advised of message as written below by provider. Patient states that he is currently in the ED at [facility].

## 2020-12-15 NOTE — ED Provider Notes (Signed)
MSE was initiated and I personally evaluated the patient and placed orders (if any) at  4:17 PM on December 15, 2020.  The patient appears stable so that the remainder of the MSE may be completed by another provider. Patient placed in Quick Look pathway, seen and evaluated   Chief Complaint: lower abdominal pain  HPI:   Pt currently being treated for diverticulitis Pt reports increased pain  ROS: no fever  Physical Exam:   Gen: No distress  Neuro: Awake and Alert  Skin: Warm    Focused Exam: tender lower abdomen   Initiation of care has begun. The patient has been counseled on the process, plan, and necessity for staying for the completion/evaluation, and the remainder of the medical screening examination   Sidney Ace 12/15/20 1619    Maudie Flakes, MD 12/16/20 [phone]

## 2020-12-15 NOTE — ED Triage Notes (Signed)
Pt was dx with diverticulitis on Saturday and since this his lower abdomen has been hurting every since then. Pt said he is on antibiotics but pain is not getting any better. Pt said he was taking tramadol but no relief. Some nausea and did vomit on Sunday but not since

## 2020-12-15 NOTE — ED Provider Notes (Incomplete)
[facility] EMERGENCY DEPARTMENT Provider Note   CSN: [id number] Arrival date & time: 12/15/20  1500     History Chief Complaint  Patient presents with  . Abdominal Pain    [name] is a 80 y.o. male past medical history significant for his CAD, diverticulitis, A. fib on Tikosyn and Eliquis, DVT, pacemaker for sinus node dysfunction Mild CHF last EF 40-45% 11/24/2020   HPI Was dx with divertic on Saturday when sx began and was DC-ed from ER on augmentin. Better on Sunday then worse Monday/tuesday with vomiting today and fatigue and worsening pain. Seems to be diffuse lower abdominal pain. States it is moderate pain presently but only really hurts when pushing on his belly.   His last dose of DOAC anticoag was last PM.   Denies fever. States he had a big diarrheal BM Monday and felt worse after that.   No CP, sob, LH dizziness.       Past Medical History:  Diagnosis Date  . Allergic rhinitis    takes Allegra daily  . Allergic rhinitis, cause unspecified 06/22/2012  . Arthritis   . Atrial fibrillation ([place])    takes Tikosyn and Eliquis daily  . 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 Coronary artery disease    LAD stenting 2004 non-DES  . Depression    took Zoloft 43yrs ago but nothing now  . Diverticulosis   . DVT (deep venous thrombosis) ([place])   . Dysphagia    occasionally  . History of blood clots 2003   left leg prior to fem pop  . History of colon polyps   . Hyperlipidemia    takes Tricor and Lipitor daily  . Insomnia    takes Ambien nightly as needed  . Joint pain   . Joint swelling   . 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 OSA (obstructive sleep apnea)    CPAP  . Pacemaker   . Peripheral vascular disease ([place])   . Pleurisy    early 19's  . Prostatitis   . Urinary urgency     Patient Active Problem List   Diagnosis Date Noted  . Diverticulitis of colon with perforation 12/15/2020  . Acute renal  failure superimposed on stage 3a chronic kidney disease ([place]) 12/15/2020  . Severe sepsis ([place]) 12/15/2020  . Eustachian tube dysfunction 06/27/2020  . CKD (chronic kidney disease) stage 3, GFR 30-59 ml/min (HCC) 06/27/2020  . Vertigo 06/27/2020  . Olecranon bursitis, right elbow 06/27/2020  . Sinus node dysfunction ([place]) 09/10/2019  . LBBB (left bundle branch block) 09/10/2019  . Hyperglycemia 04/19/2016  . Chronic systolic heart failure ([place]) 05/05/2015  . Insomnia 04/07/2015  . Aftercare following surgery of the circulatory system, [place] 04/24/2014  . Degenerative joint disease (DJD) of hip 12/09/2013  . 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 Osteoarthritis of left hip 11/05/2013  . Chronic left sacroiliac joint pain 10/13/2013  . Discoloration of skin of lower leg-Left 04/11/2013  . Allergic rhinitis 06/22/2012  . Encounter for well adult exam with abnormal findings 06/21/2012  . Chronic sinusitis 06/21/2012  . Aneurysm artery, popliteal ([place]) 01/30/2012  . Erectile dysfunction 02/21/2011  . 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 CERVICAL RADICULOPATHY, LEFT 05/31/2010  . Atrial fibrillation ([place]) 05/11/2009  . 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 LATERAL EPICONDYLITIS, RIGHT 02/02/2009  . HYPERLIPIDEMIA 01/07/2008  . DEPRESSION 01/07/2008  . OBSTRUCTIVE SLEEP APNEA 01/07/2008  . Peripheral vascular disease ([place]) 01/07/2008  .  DIVERTICULOSIS, COLON 01/07/2008    Past Surgical History:  Procedure Laterality Date  . CARDIAC CATHETERIZATION  2004  . CARDIAC CATHETERIZATION N/A 05/06/2015   Procedure: Left Heart Cath and Coronary Angiography;  Surgeon: Belva Crome, MD;  Location: [facility];  Service: Cardiovascular;  Laterality: N/A;  . COLONOSCOPY    . hand and arm surgery Right    as a teenager  . HAND SURGERY Left   . INGUINAL HERNIA REPAIR Right   . JOINT REPLACEMENT Left December 09, 2013   Left Hip   . JOINT REPLACEMENT Right Aug. 25, 2015   Right Hip  . left knee  surgery    . PACEMAKER INSERTION     due to bradycardia  . PERMANENT PACEMAKER GENERATOR CHANGE N/A 06/17/2012   Procedure: PERMANENT PACEMAKER GENERATOR CHANGE;  Surgeon: Deboraha Sprang, MD;  Location: [facility] CATH LAB;  Service: Cardiovascular;  Laterality: N/A;  . right and left fem-pop bypass    . stent (unspec)     x2  . TOTAL HIP ARTHROPLASTY Left 12/09/2013   Procedure: TOTAL HIP ARTHROPLASTY ANTERIOR APPROACH;  Surgeon: Hessie Dibble, MD;  Location: [facility];  Service: Orthopedics;  Laterality: Left;  left anterior total hip arthroplasty  . TOTAL HIP ARTHROPLASTY Right 05/05/2014   Procedure: TOTAL HIP ARTHROPLASTY ANTERIOR APPROACH;  Surgeon: Hessie Dibble, MD;  Location: [facility];  Service: Orthopedics;  Laterality: Right;  . TURBINATE REDUCTION    . wisdom extracted          Family History  Problem Relation Age of Onset  . Diabetes Other        family hx  . Sleep apnea Other        family hx  . Diabetes Mother   . Heart disease Mother        Before age 49  . Heart disease Father        Before age 52  . Heart attack Father   . Stroke Father   . Aneurysm Father        bilat pop art aneurysms  . Peripheral vascular disease Father        Popliteal Aneurysm  . Allergic rhinitis Neg Hx   . Angioedema Neg Hx   . Asthma Neg Hx   . Eczema Neg Hx   . Immunodeficiency Neg Hx   . Urticaria Neg Hx     Social History   Tobacco Use  . Smoking status: Never Smoker  . Smokeless tobacco: Never Used  Vaping Use  . Vaping Use: Never used  Substance Use Topics  . Alcohol use: No    Alcohol/week: 0.0 standard drinks    Comment: nothing since 2000  . Drug use: No    Home Medications Prior to Admission medications   Medication Sig Start Date End Date Taking? Authorizing Provider  amoxicillin-clavulanate (AUGMENTIN) 875-125 MG tablet Take 1 tablet by mouth 2 (two) times daily. One po bid x 7 days 12/11/20   Charlesetta Shanks, MD  atorvastatin (LIPITOR) 80 MG tablet TAKE 1 TABLET BY  MOUTH  DAILY 09/23/20   Deboraha Sprang, MD  CALCIUM PO Take 600 mg by mouth 2 (two) times daily.    [provider]  carvedilol (COREG) 25 MG tablet TAKE 2 TABLETS BY MOUTH  TWICE DAILY 09/23/20   Deboraha Sprang, MD  Cholecalciferol (D3 VITAMIN PO) Take 4,000 Int'l Units by mouth daily.    [provider]  Coenzyme Q10 (COQ-10 PO)  Take 300 mg by mouth daily.    [provider]  Cyanocobalamin (VITAMIN B 12 PO) Take 1 tablet by mouth daily.    [provider]  dofetilide (TIKOSYN) 500 MCG capsule TAKE 1 CAPSULE BY MOUTH  TWICE DAILY 09/23/20   Deboraha Sprang, MD  ELIQUIS 5 MG TABS tablet TAKE 1 TABLET BY MOUTH  TWICE DAILY 09/23/20   Deboraha Sprang, MD  fenofibrate 160 MG tablet TAKE 1 TABLET BY MOUTH  DAILY 09/23/20   Deboraha Sprang, MD  fexofenadine (ALLEGRA) 180 MG tablet Take 180 mg by mouth daily.    [provider]  Fluocinonide Emulsified Base 0.05 % CREA APPLY TO AFFECTED AREA(S)  TWO TIMES DAILY AS NEEDED 01/16/19   Biagio Borg, MD  FOLIC ACID PO Take 1 tablet by mouth daily.    [provider]  GLUCOSAMINE CHONDROITIN COMPLX PO Take 1 tablet by mouth 2 (two) times daily.     [provider]  losartan (COZAAR) 25 MG tablet TAKE 1 TABLET BY MOUTH  DAILY 09/23/20   Deboraha Sprang, MD  Lysine 500 MG TABS Take 500 mg by mouth 3 (three) times daily.    [provider]  Magnesium 250 MG TABS Take 250 mg by mouth 2 (two) times daily.     [provider]  Omega-3 Fatty Acids (FISH OIL) 1200 MG CAPS Take 1,200 mg by mouth daily.     [provider]  potassium chloride (KLOR-CON) 10 MEQ tablet TAKE 1 TABLET BY MOUTH  TWICE DAILY 09/23/20   Deboraha Sprang, MD  ranitidine (ZANTAC) 300 MG tablet Take 1 tablet (300 mg total) by mouth at bedtime. 03/08/17   Kozlow, Donnamarie Poag, MD  ranolazine (RANEXA) 500 MG 12 hr tablet Take 1 tablet (500 mg total) by mouth 2 (two) times daily. 06/01/20   Deboraha Sprang, MD  traMADol  Veatrice Bourbon) 50 MG tablet 1-2 tablets every 6 hours as needed for pain 12/11/20   Charlesetta Shanks, MD  triamcinolone (NASACORT) 55 MCG/ACT AERO nasal inhaler Place 2 sprays into both nostrils daily.     [provider]    Allergies    Amiodarone, Niacin, Ace inhibitors, and Mometasone furoate  Review of Systems   Review of Systems  Constitutional: Negative for chills and fever.  HENT: Negative for congestion.   Eyes: Negative for pain.  Respiratory: Negative for cough and shortness of breath.   Cardiovascular: Negative for chest pain and leg swelling.  Gastrointestinal: Positive for abdominal pain, diarrhea, nausea and vomiting.  Genitourinary: Negative for dysuria.  Musculoskeletal: Negative for myalgias.  Skin: Negative for rash.  Neurological: Negative for dizziness and headaches.    Physical Exam Updated Vital Signs BP 107/63   Pulse (!) 101   Temp (!) 97.5 F (36.4 C) (Oral)   Resp (!) 27   SpO2 96%   Physical Exam Vitals and nursing note reviewed.  Constitutional:      General: He is not in acute distress.    Comments: Pleasant in no acute distress  HENT:     Head: Normocephalic and atraumatic.     Nose: Nose normal.  Eyes:     General: No scleral icterus. Cardiovascular:     Rate and Rhythm: Regular rhythm. Tachycardia present.     Pulses: Normal pulses.     Heart sounds: Normal heart sounds.     Comments: HR 108 Pulmonary:     Effort: Pulmonary effort is normal. No respiratory  distress.     Breath sounds: No wheezing.  Abdominal:     Palpations: Abdomen is soft.     Tenderness: There is abdominal tenderness.     Comments: Lower abd TTP mild rebound. Not guarding.   Musculoskeletal:     Cervical back: Normal range of motion.     Right lower leg: No edema.     Left lower leg: No edema.  Skin:    General: Skin is warm and dry.     Capillary Refill: Capillary refill takes less than 2 seconds.  Neurological:     Mental Status: He is alert. Mental  status is at baseline.  Psychiatric:        Mood and Affect: Mood normal.        Behavior: Behavior normal.     ED Results / Procedures / Treatments   Labs (all labs ordered are listed, but only abnormal results are displayed) Labs Reviewed  COMPREHENSIVE METABOLIC PANEL - Abnormal; Notable for the following components:      Result Value   CO2 20 (*)    Glucose, Bld 133 (*)    BUN 46 (*)    Creatinine, Ser 2.13 (*)    Albumin 3.1 (*)    AST 44 (*)    Total Bilirubin 1.5 (*)    GFR, Estimated 31 (*)    All other components within normal limits  CBC - Abnormal; Notable for the following components:   WBC 15.8 (*)    All other components within normal limits  URINALYSIS, ROUTINE W REFLEX MICROSCOPIC - Abnormal; Notable for the following components:   Color, Urine AMBER (*)    APPearance HAZY (*)    Ketones, ur 5 (*)    Leukocytes,Ua TRACE (*)    Bacteria, UA RARE (*)    All other components within normal limits  CULTURE, BLOOD (ROUTINE X 2)  CULTURE, BLOOD (ROUTINE X 2)  RESP PANEL BY RT-PCR (FLU A&B, COVID) ARPGX2  LIPASE, BLOOD  LACTIC ACID, PLASMA  LACTIC ACID, PLASMA  CK    EKG EKG Interpretation  Date/Time:  Wednesday December 15 2020 23:16:06 EDT Ventricular Rate:  98 PR Interval:    QRS Duration: 145 QT Interval:  404 QTC Calculation: 516 R Axis:   -51 Text Interpretation: Atrial flutter Ventricular premature complex Nonspecific IVCD with LAD LVH with secondary repolarization abnormality Anterior Q waves, possibly due to LVH Confirmed by Nanda Quinton [phone]) on 12/15/2020 11:22:30 PM   Radiology CT ABDOMEN PELVIS WO CONTRAST  Result Date: 12/15/2020 CLINICAL DATA:  Acute abdominal pain. Diagnosed with diverticulitis on Saturday and lower abdominal pain since then. EXAM: CT ABDOMEN AND PELVIS WITHOUT CONTRAST TECHNIQUE: Multidetector CT imaging of the abdomen and pelvis was performed following the standard protocol without IV contrast. COMPARISON:  12/11/2020  FINDINGS: Lower chest: Mild dependent changes in the lung bases. Cardiac enlargement. Hepatobiliary: Probable hepatic cirrhosis with atrophy of the medial segment left lobe and nodular contour to the liver. Gallbladder and bile ducts are unremarkable. Pancreas: Diffuse fatty replacement of the pancreas. No inflammatory changes or ductal dilatation. Spleen: Normal in size without focal abnormality. Adrenals/Urinary Tract: Adrenal glands are unremarkable. Kidneys are normal, without renal calculi, focal lesion, or hydronephrosis. Bladder is unremarkable. Stomach/Bowel: Stomach is not abnormally distended. Significant progression of previous inflammatory process in the pelvis. This likely represents progression of the diverticulitis. There is extensive inflammatory stranding now seen throughout the pelvic fat with dilated fluid-filled small bowel, possibly due to small-bowel obstruction or reactive  change. There is new free intra-abdominal air consistent with perforation. No loculated abscess collection is identified. Vascular/Lymphatic: Aortic atherosclerosis. No enlarged abdominal or pelvic lymph nodes. Reproductive: Prostate gland is not enlarged. Other: Small amount of free fluid in the abdomen. Abdominal wall musculature appears intact. Musculoskeletal: Bilateral hip arthroplasties. Degenerative changes in the spine. IMPRESSION: 1. Significant progression of previous inflammatory process in the pelvis likely representing progression of the diverticulitis. New free intra-abdominal air consistent with perforation. Inflammatory stranding and edema throughout the pelvic fat. Developing small bowel dilatation with fluid-filled loops, probably reactive. No loculated abscess collection is identified. 2. Probable hepatic cirrhosis. 3. Small amount of free fluid in the abdomen. 4. Aortic atherosclerosis. Aortic Atherosclerosis (ICD10-I70.0). Critical Value/emergent results were called by telephone at the time of  interpretation on 12/15/2020 at 8:39 pm to provider [facility] , who verbally acknowledged these results. Electronically Signed   By: Lucienne Capers M.D.   On: 12/15/2020 20:44   DG Chest Portable 1 View  Result Date: 12/15/2020 CLINICAL DATA:  Shortness of breath.  Diverticulitis. EXAM: PORTABLE CHEST 1 VIEW COMPARISON:  04/19/2016 FINDINGS: Mild cardiac enlargement. Shallow inspiration with linear atelectasis in the lung bases. No consolidation or airspace disease. No pleural effusions. No pneumothorax. Cardiac pacemaker. IMPRESSION: Shallow inspiration with linear atelectasis in the lung bases. Electronically Signed   By: Lucienne Capers M.D.   On: 12/15/2020 23:22    Procedures Procedures {Remember to document critical care time when appropriate:1}  Medications Ordered in ED Medications  piperacillin-tazobactam (ZOSYN) IVPB 3.375 g (3.375 g Intravenous New Bag/Given 12/15/20 2338)  lactated ringers bolus 1,000 mL (has no administration in time range)  dofetilide (TIKOSYN) capsule 500 mcg (has no administration in time range)    ED Course  I have reviewed the triage vital signs and the nursing notes.  Pertinent labs & imaging results that were available during my care of the patient were reviewed by me and considered in my medical decision making (see chart for details).    MDM Rules/Calculators/A&P                          CT ordered in triage shows perforated diverticulitis -- Called by radiologist.   I requested pt be moved back to a room (charge nurse accomodated) -- 10:15 pt assessed by myself.   Pt mildly tachycardic has TTP lower abd; perforated Diverticulitis. Will hold DOAC, give zosyn, 1L crystaloid and cons gen surg -- gen surg is currently in trauma rec admit w abx.    Discussed with Dr. Myna Hidalgo who will see pt and admit.   Final Clinical Impression(s) / ED Diagnoses Final diagnoses:  Diverticulitis  Perforation bowel ([place])    Rx / DC Orders ED Discharge Orders     None

## 2020-12-16 ENCOUNTER — Encounter (HOSPITAL_COMMUNITY): Payer: Self-pay | Admitting: Family Medicine

## 2020-12-16 DIAGNOSIS — I5021 Acute systolic (congestive) heart failure: Secondary | ICD-10-CM

## 2020-12-16 DIAGNOSIS — N179 Acute kidney failure, unspecified: Secondary | ICD-10-CM | POA: Diagnosis not present

## 2020-12-16 DIAGNOSIS — I5042 Chronic combined systolic (congestive) and diastolic (congestive) heart failure: Secondary | ICD-10-CM | POA: Diagnosis not present

## 2020-12-16 DIAGNOSIS — K5720 Diverticulitis of large intestine with perforation and abscess without bleeding: Secondary | ICD-10-CM | POA: Diagnosis not present

## 2020-12-16 DIAGNOSIS — I480 Paroxysmal atrial fibrillation: Secondary | ICD-10-CM | POA: Diagnosis not present

## 2020-12-16 DIAGNOSIS — Z01810 Encounter for preprocedural cardiovascular examination: Secondary | ICD-10-CM | POA: Diagnosis not present

## 2020-12-16 LAB — HEPATIC FUNCTION PANEL
ALT: 26 U/L (ref 0–44)
AST: 30 U/L (ref 15–41)
Albumin: 2.3 g/dL — ABNORMAL LOW (ref 3.5–5.0)
Alkaline Phosphatase: 54 U/L (ref 38–126)
Bilirubin, Direct: 0.4 mg/dL — ABNORMAL HIGH (ref 0.0–0.2)
Indirect Bilirubin: 0.7 mg/dL (ref 0.3–0.9)
Total Bilirubin: 1.1 mg/dL (ref 0.3–1.2)
Total Protein: 5 g/dL — ABNORMAL LOW (ref 6.5–8.1)

## 2020-12-16 LAB — BASIC METABOLIC PANEL
Anion gap: 7 (ref 5–15)
Anion gap: 10 (ref 5–15)
BUN: 44 mg/dL — ABNORMAL HIGH (ref 8–23)
BUN: 52 mg/dL — ABNORMAL HIGH (ref 8–23)
CO2: 21 mmol/L — ABNORMAL LOW (ref 22–32)
CO2: 19 mmol/L — ABNORMAL LOW (ref 22–32)
Calcium: 8.3 mg/dL — ABNORMAL LOW (ref 8.9–10.3)
Calcium: 9.5 mg/dL (ref 8.9–10.3)
Chloride: 108 mmol/L (ref 98–111)
Chloride: 106 mmol/L (ref 98–111)
Creatinine, Ser: 1.7 mg/dL — ABNORMAL HIGH (ref 0.61–1.24)
Creatinine, Ser: 1.75 mg/dL — ABNORMAL HIGH (ref 0.61–1.24)
GFR, Estimated: 40 mL/min — ABNORMAL LOW (ref >60)
GFR, Estimated: 39 mL/min — ABNORMAL LOW (ref >60)
Glucose, Bld: 108 mg/dL — ABNORMAL HIGH (ref 70–99)
Glucose, Bld: 102 mg/dL — ABNORMAL HIGH (ref 70–99)
Potassium: 3.4 mmol/L — ABNORMAL LOW (ref 3.5–5.1)
Potassium: 4.3 mmol/L (ref 3.5–5.1)
Sodium: 136 mmol/L (ref 135–145)
Sodium: 135 mmol/L (ref 135–145)

## 2020-12-16 LAB — HEPARIN LEVEL (UNFRACTIONATED): Heparin Unfractionated: 1.98 IU/mL — ABNORMAL HIGH (ref 0.30–0.70)

## 2020-12-16 LAB — RESP PANEL BY RT-PCR (FLU A&B, COVID) ARPGX2
Influenza A by PCR: NEGATIVE
Influenza B by PCR: NEGATIVE
SARS Coronavirus 2 by RT PCR: NEGATIVE

## 2020-12-16 LAB — CBC
HCT: 37.5 % — ABNORMAL LOW (ref 39.0–52.0)
Hemoglobin: 12.4 g/dL — ABNORMAL LOW (ref 13.0–17.0)
MCH: 33 pg (ref 26.0–34.0)
MCHC: 33.1 g/dL (ref 30.0–36.0)
MCV: 99.7 fL (ref 80.0–100.0)
Platelets: 214 10*3/uL (ref 150–400)
RBC: 3.76 MIL/uL — ABNORMAL LOW (ref 4.22–5.81)
RDW: 14.6 % (ref 11.5–15.5)
WBC: 11.4 10*3/uL — ABNORMAL HIGH (ref 4.0–10.5)
nRBC: 0 % (ref 0.0–0.2)

## 2020-12-16 LAB — MAGNESIUM: Magnesium: 1.9 mg/dL (ref 1.7–2.4)

## 2020-12-16 LAB — APTT
aPTT: 43 seconds — ABNORMAL HIGH (ref 24–36)
aPTT: 59 seconds — ABNORMAL HIGH (ref 24–36)

## 2020-12-16 LAB — CK: Total CK: 97 U/L (ref 49–397)

## 2020-12-16 LAB — LACTIC ACID, PLASMA: Lactic Acid, Venous: 1.9 mmol/L (ref 0.5–1.9)

## 2020-12-16 MED ORDER — LACTATED RINGERS IV SOLN: INTRAVENOUS | Status: DC

## 2020-12-16 MED ORDER — DOFETILIDE 500 MCG PO CAPS
500.0000 ug | ORAL_CAPSULE | Freq: Two times a day (BID) | ORAL | Status: DC
  Administered 2020-12-17: 500 ug via ORAL
  Filled 2020-12-16 (×2): qty 1

## 2020-12-16 MED ORDER — ACETAMINOPHEN 650 MG RE SUPP: 650.0000 mg | Freq: Four times a day (QID) | RECTAL | Status: DC | PRN

## 2020-12-16 MED ORDER — ACETAMINOPHEN 325 MG PO TABS
650.0000 mg | ORAL_TABLET | Freq: Four times a day (QID) | ORAL | Status: DC | PRN
  Administered 2020-12-17 – 2020-12-20 (×3): 650 mg via ORAL
  Filled 2020-12-16 (×3): qty 2

## 2020-12-16 MED ORDER — ONDANSETRON HCL 4 MG/2ML IJ SOLN: 4.0000 mg | Freq: Four times a day (QID) | INTRAMUSCULAR | Status: DC | PRN

## 2020-12-16 MED ORDER — HEPARIN (PORCINE) 25000 UT/250ML-% IV SOLN
1750.0000 [IU]/h | INTRAVENOUS | Status: AC
  Administered 2020-12-16: 1200 [IU]/h via INTRAVENOUS
  Administered 2020-12-16: 1500 [IU]/h via INTRAVENOUS
  Administered 2020-12-18: 1750 [IU]/h via INTRAVENOUS
  Administered 2020-12-18: 1800 [IU]/h via INTRAVENOUS
  Administered 2020-12-19 – 2020-12-20 (×3): 1750 [IU]/h via INTRAVENOUS
  Filled 2020-12-16 (×9): qty 250

## 2020-12-16 MED ORDER — CARVEDILOL 25 MG PO TABS
50.0000 mg | ORAL_TABLET | Freq: Two times a day (BID) | ORAL | Status: DC
  Administered 2020-12-16 – 2020-12-17 (×3): 50 mg via ORAL
  Administered 2020-12-18: 25 mg via ORAL
  Filled 2020-12-16 (×6): qty 2

## 2020-12-16 MED ORDER — HYDROMORPHONE HCL 1 MG/ML IJ SOLN
0.2500 mg | INTRAMUSCULAR | Status: DC | PRN
  Administered 2020-12-17 – 2020-12-20 (×2): 0.5 mg via INTRAVENOUS
  Filled 2020-12-16 (×2): qty 1

## 2020-12-16 MED ORDER — FAMOTIDINE IN NACL 20-0.9 MG/50ML-% IV SOLN: 20.0000 mg | Freq: Two times a day (BID) | INTRAVENOUS | Status: DC

## 2020-12-16 MED ORDER — PIPERACILLIN-TAZOBACTAM 3.375 G IVPB
3.3750 g | Freq: Three times a day (TID) | INTRAVENOUS | Status: DC
  Administered 2020-12-16 – 2020-12-24 (×26): 3.375 g via INTRAVENOUS
  Filled 2020-12-16 (×27): qty 50

## 2020-12-16 MED ORDER — ONDANSETRON HCL 4 MG PO TABS: 4.0000 mg | ORAL_TABLET | Freq: Four times a day (QID) | ORAL | Status: DC | PRN

## 2020-12-16 MED ORDER — PANTOPRAZOLE SODIUM 40 MG IV SOLR
40.0000 mg | Freq: Every day | INTRAVENOUS | Status: DC
  Administered 2020-12-16 – 2020-12-24 (×9): 40 mg via INTRAVENOUS
  Filled 2020-12-16 (×10): qty 40

## 2020-12-16 MED ORDER — METOPROLOL TARTRATE 5 MG/5ML IV SOLN
2.5000 mg | Freq: Four times a day (QID) | INTRAVENOUS | Status: DC | PRN
  Administered 2020-12-17 – 2020-12-24 (×2): 2.5 mg via INTRAVENOUS
  Filled 2020-12-16 (×3): qty 5

## 2020-12-16 MED ORDER — SODIUM CHLORIDE 0.9 % IV SOLN: INTRAVENOUS | Status: DC

## 2020-12-16 MED ORDER — POTASSIUM CHLORIDE 10 MEQ/100ML IV SOLN
10.0000 meq | INTRAVENOUS | Status: AC
  Administered 2020-12-16 (×3): 10 meq via INTRAVENOUS
  Filled 2020-12-16 (×3): qty 100

## 2020-12-16 NOTE — Progress Notes (Signed)
ANTICOAGULATION CONSULT NOTE - Initial Consult  Pharmacy Consult for Heparin (Apixaban on hold) Indication: atrial fibrillation  Allergies  Allergen Reactions  . Amiodarone     REACTION: peripheral neuropathy  . Niacin     REACTION: irregular heartbeat  . Ace Inhibitors     REACTION: cough  . Mometasone Furoate Other (See Comments)    (Nasonex) Causes nasal bleeding and nose bleeds    Vital Signs: Temp: 97.5 F (36.4 C) (04/06 1952) Temp Source: Oral (04/06 1952) BP: 106/76 (04/07 0100) Pulse Rate: 80 (04/07 0100)  Labs: Recent Labs    12/15/20 1614 12/15/20 2225  HGB 14.5  --   HCT 44.2  --   PLT 269  --   CREATININE 2.13*  --   CKTOTAL  --  97    Estimated Creatinine Clearance: 32.5 mL/min (A) (by C-G formula based on SCr of 2.13 mg/dL (H)).   Medical History: Past Medical History:  Diagnosis Date  . Allergic rhinitis    takes Allegra daily  . Allergic rhinitis, cause unspecified 06/22/2012  . Arthritis   . Atrial fibrillation ([place])    takes Tikosyn and Eliquis daily  . 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 Coronary artery disease    LAD stenting 2004 non-DES  . Depression    took Zoloft 44yrs ago but nothing now  . Diverticulosis   . DVT (deep venous thrombosis) ([place])   . Dysphagia    occasionally  . History of blood clots 2003   left leg prior to fem pop  . History of colon polyps   . Hyperlipidemia    takes Tricor and Lipitor daily  . Insomnia    takes Ambien nightly as needed  . Joint pain   . Joint swelling   . 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 OSA (obstructive sleep apnea)    CPAP  . Pacemaker   . Peripheral vascular disease ([place])   . Pleurisy    early 70's  . Prostatitis   . Urinary urgency     Assessment: 80 y/o M with abdominal pain found to have diverticulitis/perforation. On apixaban PTA for afib. Holding apixaban and starting heparin in case surgery is needed. Last dose on apixaban was 4/5  PM>>ok to start heparin now. Anticipate using aPTT to dose. CBC good. Noted renal dysfunction.   Goal of Therapy:  Heparin level 0.3-0.7 units/ml aPTT 66-102 seconds Monitor platelets by anticoagulation protocol: Yes   Plan:  Start heparin drip at 1200 units/hr 1000 aPTT/heparin level Daily CBC/aPTT/heparin level Monitor for bleeding  Narda Bonds, PharmD, BCPS Clinical Pharmacist Phone: [phone]

## 2020-12-16 NOTE — Consult Note (Signed)
[facility] Surgery Consult Note  [name] [birth year]  [id number].    Requesting MD: Posey Pronto, MD Chief Complaint/Reason for Consult: Diverticulitis with microperforation HPI:  [name] is an 80 y/o M with PMH diverticulosis, a fib on Eliquis (last dose 12/14/20), CHF, CAD with stent, Hx pacemaker placement, OSA, and CKD IIIa who presented to the ED with cc LLQ abdominal pain. Patient was previously seen in ED 12/11/20 with similar complaints where he was diagnosed with uncomplicated sigmoid diverticulitis and discharged with a prescription for Augmentin and tramadol. His pain progressed and worsened, bringing him back to ED. Pt denies similar pain in the past. Pt states in hind-sight he was having abdominal bloating dating back to mid-march as well as dark urine. Other associated sxs include nausea, vomiting, decreased appetite, urinary hesitancy, and decrease frequency of BMs. Last episode of emesis was sunday 4/3. He denies fever, chills, CP, SOB, melena, hematochezia. States he is followed by [facility] and was told he could stop getting colonoscopies, he has a history of diverticulosis but no history of colon malignancy. Abdominal surgeries include right inguinal hernia repair. States he works in [employer]. His wife is at bedside.  He states he has an appointment with his cardiologist, Dr. Caryl Comes, next week to discuss his a.fib including the potential need for cardiac ablation.  ED workup: afebrile, HR 101, Creatinine 2.13, WBC 15,800 and CT abdomen and pelvis W/O constrast showing interval increase in pelvic pericolonic inflammatory changes with pneumoperitoneum, as well as signs of reactive ileus. General surgery has been asked to see.  ROS: Review of Systems  Constitutional: Negative.   HENT: Negative.   Eyes: Negative.   Cardiovascular: Negative.   Gastrointestinal: Positive for abdominal pain, nausea and vomiting. Negative for blood in stool, constipation, diarrhea and  melena.  Genitourinary: Negative.   Musculoskeletal: Negative.   Skin: Negative.   Neurological: Negative.   Endo/Heme/Allergies: Negative.   Psychiatric/Behavioral: Negative.     Family History  Problem Relation Age of Onset  . Diabetes Other        family hx  . Sleep apnea Other        family hx  . Diabetes Mother   . Heart disease Mother        Before age 62  . Heart disease Father        Before age 47  . Heart attack Father   . Stroke Father   . Aneurysm Father        bilat pop art aneurysms  . Peripheral vascular disease Father        Popliteal Aneurysm  . Allergic rhinitis Neg Hx   . Angioedema Neg Hx   . Asthma Neg Hx   . Eczema Neg Hx   . Immunodeficiency Neg Hx   . Urticaria Neg Hx     Past Medical History:  Diagnosis Date  . Allergic rhinitis    takes Allegra daily  . Allergic rhinitis, cause unspecified 06/22/2012  . Arthritis   . Atrial fibrillation ([place])    takes Tikosyn and Eliquis daily  . 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 Coronary artery disease    LAD stenting 2004 non-DES  . Depression    took Zoloft 36yrs ago but nothing now  . Diverticulosis   . DVT (deep venous thrombosis) ([place])   . Dysphagia    occasionally  . History of blood clots 2003   left leg prior to fem pop  . History of colon polyps   .  Hyperlipidemia    takes Tricor and Lipitor daily  . Insomnia    takes Ambien nightly as needed  . Joint pain   . Joint swelling   . 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 OSA (obstructive sleep apnea)    CPAP  . Pacemaker   . Peripheral vascular disease ([place])   . Pleurisy    early 61's  . Prostatitis   . Urinary urgency     Past Surgical History:  Procedure Laterality Date  . CARDIAC CATHETERIZATION  2004  . CARDIAC CATHETERIZATION N/A 05/06/2015   Procedure: Left Heart Cath and Coronary Angiography;  Surgeon: Belva Crome, MD;  Location: [facility];  Service: Cardiovascular;  Laterality:  N/A;  . COLONOSCOPY    . hand and arm surgery Right    as a teenager  . HAND SURGERY Left   . INGUINAL HERNIA REPAIR Right   . JOINT REPLACEMENT Left December 09, 2013   Left Hip   . JOINT REPLACEMENT Right Aug. 25, 2015   Right Hip  . left knee surgery    . PACEMAKER INSERTION     due to bradycardia  . PERMANENT PACEMAKER GENERATOR CHANGE N/A 06/17/2012   Procedure: PERMANENT PACEMAKER GENERATOR CHANGE;  Surgeon: Deboraha Sprang, MD;  Location: [facility] CATH LAB;  Service: Cardiovascular;  Laterality: N/A;  . right and left fem-pop bypass    . stent (unspec)     x2  . TOTAL HIP ARTHROPLASTY Left 12/09/2013   Procedure: TOTAL HIP ARTHROPLASTY ANTERIOR APPROACH;  Surgeon: Hessie Dibble, MD;  Location: [place];  Service: Orthopedics;  Laterality: Left;  left anterior total hip arthroplasty  . TOTAL HIP ARTHROPLASTY Right 05/05/2014   Procedure: TOTAL HIP ARTHROPLASTY ANTERIOR APPROACH;  Surgeon: Hessie Dibble, MD;  Location: [place];  Service: Orthopedics;  Laterality: Right;  . TURBINATE REDUCTION    . wisdom extracted       Social History:  reports that he has never smoked. He has never used smokeless tobacco. He reports that he does not drink alcohol and does not use drugs.  Allergies:  Allergies  Allergen Reactions  . Amiodarone     REACTION: peripheral neuropathy  . Niacin     REACTION: irregular heartbeat  . Ace Inhibitors     REACTION: cough  . Mometasone Furoate Other (See Comments)    (Nasonex) Causes nasal bleeding and nose bleeds    Medications Prior to Admission  Medication Sig Dispense Refill  . amoxicillin-clavulanate (AUGMENTIN) 875-125 MG tablet Take 1 tablet by mouth 2 (two) times daily. One po bid x 7 days 20 tablet 0  . atorvastatin (LIPITOR) 80 MG tablet TAKE 1 TABLET BY MOUTH  DAILY 90 tablet 2  . CALCIUM PO Take 600 mg by mouth 2 (two) times daily.    . carvedilol (COREG) 25 MG tablet TAKE 2 TABLETS BY MOUTH  TWICE DAILY 360 tablet 2  . Cholecalciferol (D3  VITAMIN PO) Take 4,000 Int'l Units by mouth daily.    . Coenzyme Q10 (COQ-10 PO) Take 300 mg by mouth daily.    . Cyanocobalamin (VITAMIN B 12 PO) Take 1 tablet by mouth daily.    Marland Kitchen dofetilide (TIKOSYN) 500 MCG capsule TAKE 1 CAPSULE BY MOUTH  TWICE DAILY 180 capsule 2  . ELIQUIS 5 MG TABS tablet TAKE 1 TABLET BY MOUTH  TWICE DAILY 180 tablet 2  . fenofibrate 160 MG tablet TAKE 1 TABLET BY MOUTH  DAILY 90 tablet 2  . fexofenadine (ALLEGRA) 180 MG tablet Take 180 mg by mouth daily.    . Fluocinonide Emulsified Base 0.05 % CREA APPLY TO AFFECTED AREA(S)  TWO TIMES DAILY AS NEEDED 60 g 2  . FOLIC ACID PO Take 1 tablet by mouth daily.    Marland Kitchen GLUCOSAMINE CHONDROITIN COMPLX PO Take 1 tablet by mouth 2 (two) times daily.     Marland Kitchen losartan (COZAAR) 25 MG tablet TAKE 1 TABLET BY MOUTH  DAILY 90 tablet 2  . Lysine 500 MG TABS Take 500 mg by mouth 3 (three) times daily.    . Magnesium 250 MG TABS Take 250 mg by mouth 2 (two) times daily.     . Omega-3 Fatty Acids (FISH OIL) 1200 MG CAPS Take 1,200 mg by mouth daily.     . potassium chloride (KLOR-CON) 10 MEQ tablet TAKE 1 TABLET BY MOUTH  TWICE DAILY 180 tablet 2  . ranitidine (ZANTAC) 300 MG tablet Take 1 tablet (300 mg total) by mouth at bedtime. 90 tablet 2  . ranolazine (RANEXA) 500 MG 12 hr tablet Take 1 tablet (500 mg total) by mouth 2 (two) times daily. 180 tablet 3  . traMADol (ULTRAM) 50 MG tablet 1-2 tablets every 6 hours as needed for pain 20 tablet 0  . triamcinolone (NASACORT) 55 MCG/ACT AERO nasal inhaler Place 2 sprays into both nostrils daily.       Blood pressure (!) 102/54, pulse 67, temperature 97.6 F (36.4 C), temperature source Oral, resp. rate 20, height 6\' 3"  (1.905 m), weight 99.8 kg, SpO2 97 %. Physical Exam: Constitutional: NAD; conversant; no deformities Eyes: Moist conjunctiva; no lid lag; anicteric; PERRL Neck: Trachea midline; no thyromegaly Lungs: Normal respiratory effort; no tactile fremitus CV: irregular 98-120 bpm  during my exam; no palpable thrills; no pitting edema GI: Abd is moderately distended with hypoactive, tinkering bowel sounds, he is tender to light palpation of the lower abdomen with guarding, there is no rebound tenderness, no palpable hepatosplenomegaly MSK: symmetrical, no clubbing/cyanosis, no peripheral edema  Psychiatric: Appropriate affect; alert and oriented x3 Lymphatic: No palpable cervical or axillary lymphadenopathy  Results for orders placed or performed during the hospital encounter of 12/15/20 (from the past 48 hour(s))  Lipase, blood     Status: None   Collection Time: 12/15/20  4:14 PM  Result Value Ref Range   Lipase 22 11 - 51 U/L    Comment: Performed at [facility], [place] [address], [place], [place] [address]  Comprehensive metabolic panel     Status: Abnormal   Collection Time: 12/15/20  4:14 PM  Result Value Ref Range   Sodium 136 135 - 145 mmol/L   Potassium 4.9 3.5 - 5.1 mmol/L   Chloride 105 98 - 111 mmol/L   CO2 20 (L) 22 - 32 mmol/L   Glucose, Bld 133 (H) 70 - 99 mg/dL    Comment: Glucose reference range applies only to samples taken after fasting for at least 8 hours.   BUN 46 (H) 8 - 23 mg/dL   Creatinine, Ser 2.13 (H) 0.61 - 1.24 mg/dL   Calcium 10.3 8.9 - 10.3 mg/dL   Total Protein 6.5 6.5 - 8.1 g/dL   Albumin 3.1 (L) 3.5 - 5.0 g/dL   AST 44 (H) 15 - 41 U/L   ALT 35 0 - 44 U/L   Alkaline Phosphatase 83 38 - 126 U/L   Total Bilirubin 1.5 (H) 0.3 - 1.2 mg/dL  GFR, Estimated 31 (L) >60 mL/min    Comment: (NOTE) Calculated using the CKD-EPI Creatinine Equation (2021)    Anion gap 11 5 - 15    Comment: Performed at [facility], [place] [address] [place], [place] [address]  CBC     Status: Abnormal   Collection Time: 12/15/20  4:14 PM  Result Value Ref Range   WBC 15.8 (H) 4.0 - 10.5 K/uL   RBC 4.47 4.22 - 5.81 MIL/uL   Hemoglobin 14.5 13.0 - 17.0 g/dL   HCT 44.2 39.0 - 52.0 %   MCV 98.9 80.0 - 100.0 fL   MCH 32.4 26.0 - 34.0 pg    MCHC 32.8 30.0 - 36.0 g/dL   RDW 14.6 11.5 - 15.5 %   Platelets 269 150 - 400 K/uL   nRBC 0.0 0.0 - 0.2 %    Comment: Performed at [facility], [place] [address] [place], [place] [address]  Urinalysis, Routine w reflex microscopic Urine, Clean Catch     Status: Abnormal   Collection Time: 12/15/20  4:14 PM  Result Value Ref Range   Color, Urine AMBER (A) YELLOW    Comment: BIOCHEMICALS MAY BE AFFECTED BY COLOR   APPearance HAZY (A) CLEAR   Specific Gravity, Urine 1.024 1.005 - 1.030   pH 5.0 5.0 - 8.0   Glucose, UA NEGATIVE NEGATIVE mg/dL   Hgb urine dipstick NEGATIVE NEGATIVE   Bilirubin Urine NEGATIVE NEGATIVE   Ketones, ur 5 (A) NEGATIVE mg/dL   Protein, ur NEGATIVE NEGATIVE mg/dL   Nitrite NEGATIVE NEGATIVE   Leukocytes,Ua TRACE (A) NEGATIVE   RBC / HPF 0-5 0 - 5 RBC/hpf   WBC, UA 6-10 0 - 5 WBC/hpf   Bacteria, UA RARE (A) NONE SEEN   Squamous Epithelial / LPF 0-5 0 - 5   Mucus PRESENT    Hyaline Casts, UA PRESENT    Ca Oxalate Crys, UA PRESENT     Comment: Performed at [facility], [address], [place], [place] [address]  Resp Panel by RT-PCR (Flu A&B, Covid) Nasopharyngeal Swab     Status: None   Collection Time: 12/15/20 10:21 PM   Specimen: Nasopharyngeal Swab; Nasopharyngeal(NP) swabs in vial transport medium  Result Value Ref Range   SARS Coronavirus 2 by RT PCR NEGATIVE NEGATIVE    Comment: (NOTE) SARS-CoV-2 target nucleic acids are NOT DETECTED.  The SARS-CoV-2 RNA is generally detectable in upper respiratory specimens during the acute phase of infection. The lowest concentration of SARS-CoV-2 viral copies this assay can detect is 138 copies/mL. A negative result does not preclude SARS-Cov-2 infection and should not be used as the sole basis for treatment or other patient management decisions. A negative result may occur with  improper specimen collection/handling, submission of specimen other than nasopharyngeal swab, presence of viral  mutation(s) within the areas targeted by this assay, and inadequate number of viral copies(<138 copies/mL). A negative result must be combined with clinical observations, patient history, and epidemiological information. The expected result is Negative.  Fact Sheet for Patients:  EntrepreneurPulse.com.au  Fact Sheet for Healthcare Providers:  IncredibleEmployment.be  This test is no t yet approved or cleared by the [place] FDA and  has been authorized for detection and/or diagnosis of SARS-CoV-2 by FDA under an Emergency Use Authorization (EUA). This EUA will remain  in effect (meaning this test can be used) for the duration of the COVID-19 declaration under Section 564(b)(1) of the Act, 21 U.S.C.section 360bbb-3(b)(1),  unless the authorization is terminated  or revoked sooner.       Influenza A by PCR NEGATIVE NEGATIVE   Influenza B by PCR NEGATIVE NEGATIVE    Comment: (NOTE) The Xpert Xpress SARS-CoV-2/FLU/RSV plus assay is intended as an aid in the diagnosis of influenza from Nasopharyngeal swab specimens and should not be used as a sole basis for treatment. Nasal washings and aspirates are unacceptable for Xpert Xpress SARS-CoV-2/FLU/RSV testing.  Fact Sheet for Patients: EntrepreneurPulse.com.au  Fact Sheet for Healthcare Providers: IncredibleEmployment.be  This test is not yet approved or cleared by the [place] FDA and has been authorized for detection and/or diagnosis of SARS-CoV-2 by FDA under an Emergency Use Authorization (EUA). This EUA will remain in effect (meaning this test can be used) for the duration of the COVID-19 declaration under Section 564(b)(1) of the Act, 21 U.S.C. section 360bbb-3(b)(1), unless the authorization is terminated or revoked.  Performed at [facility], [place] [address] [place], [place] [address]   CK     Status: None   Collection Time: 12/15/20  10:25 PM  Result Value Ref Range   Total CK 97 49 - 397 U/L    Comment: Performed at [facility], [place] [address] [place], [place] [address]  Lactic acid, plasma     Status: None   Collection Time: 12/15/20 11:39 PM  Result Value Ref Range   Lactic Acid, Venous 1.9 0.5 - 1.9 mmol/L    Comment: Performed at [facility] [address] [place], [place] [address]  Basic metabolic panel     Status: Abnormal   Collection Time: 12/16/20  2:00 AM  Result Value Ref Range   Sodium 136 135 - 145 mmol/L   Potassium 3.4 (L) 3.5 - 5.1 mmol/L   Chloride 108 98 - 111 mmol/L   CO2 21 (L) 22 - 32 mmol/L   Glucose, Bld 108 (H) 70 - 99 mg/dL    Comment: Glucose reference range applies only to samples taken after fasting for at least 8 hours.   BUN 44 (H) 8 - 23 mg/dL   Creatinine, Ser 1.70 (H) 0.61 - 1.24 mg/dL   Calcium 8.3 (L) 8.9 - 10.3 mg/dL   GFR, Estimated 40 (L) >60 mL/min    Comment: (NOTE) Calculated using the CKD-EPI Creatinine Equation (2021)    Anion gap 7 5 - 15    Comment: Performed at [facility] [address], [place], [place] [address]  CBC     Status: Abnormal   Collection Time: 12/16/20  2:00 AM  Result Value Ref Range   WBC 11.4 (H) 4.0 - 10.5 K/uL   RBC 3.76 (L) 4.22 - 5.81 MIL/uL   Hemoglobin 12.4 (L) 13.0 - 17.0 g/dL   HCT 37.5 (L) 39.0 - 52.0 %   MCV 99.7 80.0 - 100.0 fL   MCH 33.0 26.0 - 34.0 pg   MCHC 33.1 30.0 - 36.0 g/dL   RDW 14.6 11.5 - 15.5 %   Platelets 214 150 - 400 K/uL   nRBC 0.0 0.0 - 0.2 %    Comment: Performed at [facility], [place] [address] [place],  [address]  Hepatic function panel     Status: Abnormal   Collection Time: 12/16/20  2:00 AM  Result Value Ref Range   Total Protein 5.0 (L) 6.5 - 8.1 g/dL   Albumin 2.3 (L) 3.5 - 5.0 g/dL   AST 30 15 - 41 U/L  ALT 26 0 - 44 U/L   Alkaline Phosphatase 54 38 - 126 U/L   Total Bilirubin 1.1 0.3 - 1.2 mg/dL   Bilirubin, Direct 0.4 (H) 0.0 - 0.2 mg/dL   Indirect  Bilirubin 0.7 0.3 - 0.9 mg/dL    Comment: Performed at [facility] [address], [place], [place] [address]  APTT     Status: Abnormal   Collection Time: 12/16/20 10:07 AM  Result Value Ref Range   aPTT 43 (H) 24 - 36 seconds    Comment:        IF BASELINE aPTT IS ELEVATED, SUGGEST PATIENT RISK ASSESSMENT BE USED TO DETERMINE APPROPRIATE ANTICOAGULANT THERAPY. Performed at [facility], [place] [address], [place], [place] [address]    CT ABDOMEN PELVIS WO CONTRAST  Result Date: 12/15/2020 CLINICAL DATA:  Acute abdominal pain. Diagnosed with diverticulitis on Saturday and lower abdominal pain since then. EXAM: CT ABDOMEN AND PELVIS WITHOUT CONTRAST TECHNIQUE: Multidetector CT imaging of the abdomen and pelvis was performed following the standard protocol without IV contrast. COMPARISON:  12/11/2020 FINDINGS: Lower chest: Mild dependent changes in the lung bases. Cardiac enlargement. Hepatobiliary: Probable hepatic cirrhosis with atrophy of the medial segment left lobe and nodular contour to the liver. Gallbladder and bile ducts are unremarkable. Pancreas: Diffuse fatty replacement of the pancreas. No inflammatory changes or ductal dilatation. Spleen: Normal in size without focal abnormality. Adrenals/Urinary Tract: Adrenal glands are unremarkable. Kidneys are normal, without renal calculi, focal lesion, or hydronephrosis. Bladder is unremarkable. Stomach/Bowel: Stomach is not abnormally distended. Significant progression of previous inflammatory process in the pelvis. This likely represents progression of the diverticulitis. There is extensive inflammatory stranding now seen throughout the pelvic fat with dilated fluid-filled small bowel, possibly due to small-bowel obstruction or reactive change. There is new free intra-abdominal air consistent with perforation. No loculated abscess collection is identified. Vascular/Lymphatic: Aortic atherosclerosis. No enlarged abdominal or pelvic lymph  nodes. Reproductive: Prostate gland is not enlarged. Other: Small amount of free fluid in the abdomen. Abdominal wall musculature appears intact. Musculoskeletal: Bilateral hip arthroplasties. Degenerative changes in the spine. IMPRESSION: 1. Significant progression of previous inflammatory process in the pelvis likely representing progression of the diverticulitis. New free intra-abdominal air consistent with perforation. Inflammatory stranding and edema throughout the pelvic fat. Developing small bowel dilatation with fluid-filled loops, probably reactive. No loculated abscess collection is identified. 2. Probable hepatic cirrhosis. 3. Small amount of free fluid in the abdomen. 4. Aortic atherosclerosis. Aortic Atherosclerosis (ICD10-I70.0). Critical Value/emergent results were called by telephone at the time of interpretation on 12/15/2020 at 8:39 pm to provider [facility] , who verbally acknowledged these results. Electronically Signed   By: Lucienne Capers M.D.   On: 12/15/2020 20:44   DG Chest Portable 1 View  Result Date: 12/15/2020 CLINICAL DATA:  Shortness of breath.  Diverticulitis. EXAM: PORTABLE CHEST 1 VIEW COMPARISON:  04/19/2016 FINDINGS: Mild cardiac enlargement. Shallow inspiration with linear atelectasis in the lung bases. No consolidation or airspace disease. No pleural effusions. No pneumothorax. Cardiac pacemaker. IMPRESSION: Shallow inspiration with linear atelectasis in the lung bases. Electronically Signed   By: Lucienne Capers M.D.   On: 12/15/2020 23:22      Assessment/Plan a fib on Eliquis (last dose 12/14/20) Cardiomyopathy, Combined systolic and diastolic CHF, Hx pacemaker placement; LVEF echo 11/2020 40-45% CAD with stent OSA CKD IIIa - Cr 1.7  Diverticulitis with perforation, without abscess - afebrile, tachycardic during my exam with BP 95/50 (was given coreg at  [email or web address]), WBC 11.4 from 15.8 - patient with significant abdominal tenderness on exam but no peritonitis  - No  emergent surgical needs this morning. Agree with bowel rest, IV abx, monitoring of abd exams, and attempted non-operative management of perforated diverticulitis. Clinical deterioration or failure to improve may warrant ex lap, partial colectomy, colostomy. I have called cardiology to consult on the patient for perioperative risk stratification in the instance that he needs an operation. - he does have sxs reactive ileus with belching, hiccups, and minimal flatus. No emesis for 3-4 days. Would not recommend NG tube at this time but would monitor.   FEN: NPO, sips with meds ID: Zosyn 4/6 >> VTE: SCD's, hep gtt Foley: None  Dispo: SDU, IV abx, bowel rest, cards consult.  Jill Alexanders, PA-C [facility] Please see Amion for pager number during day hours 7:00am-4:30pm 12/16/2020, 10:57 AM

## 2020-12-16 NOTE — Progress Notes (Signed)
Mobility Specialist: Progress Note   12/16/20 1447  Mobility  Activity Ambulated in hall  Level of Assistance Independent  Assistive Device Four wheel walker  Distance Ambulated (ft) 470 ft  Mobility Response Tolerated well  Mobility performed by Mobility specialist  $Mobility charge 1 Mobility   Pre-Mobility: 74 HR Post-Mobility: 84 HR  Pt asx during ambulation. Pt's HR peaked at 84 bpm during ambulation, RN notified. Pt to BR after walk per request. Instructed pt to pull call string on wall when he is done for staff to [name] Mobility Specialist Mobility Specialist Phone: [phone]

## 2020-12-16 NOTE — Progress Notes (Addendum)
   Called with question in regards to patients Tikosyn.   QTc "510" range on EKG.  When corrected for his LBBB, QTC in 470-480 range and stable to continue.    He missed 1 dose yesterday morning. He does not require monitoring.   He got his dose last night at an odd time so will need to "march back" his dosing an hour at a time to avoid adverse side effects.   He should get a dose NOW.   Next dose to follow 11 hours after, will place orders to march back each dose until he is close to his home regimen of ~0800 and ~2000.    Will check EKG after tomorrows daytime dose for completeness.   K 3.4 this am and supp given.  Check Mg.   Keep K > 4.0 and Mg > 2.0 on tikosyn.   OK to hold anticoagulation for surgery if needed.    Follow creatinine closely. If CrCl remains below 60 mL/min will need to consider Tikosyn dose reduction. Discussed with Dr. Caryl Comes who agrees with following Creatinine as he improves.   Legrand Como [address] [place], PA-C  12/16/2020 1:35 PM

## 2020-12-16 NOTE — Progress Notes (Signed)
Mobility Specialist: Progress Note   12/16/20 1300  Mobility  Activity Ambulated to bathroom  Level of Assistance Independent  Assistive Device None  Distance Ambulated (ft) 20 ft  Mobility Response Tolerated well  Mobility performed by Mobility specialist;Nurse  Bed Position Chair   Attempted to ambulate pt but pt's HR fluctuating between 110-160 while in BR. Assisted pt to chair with RN. Will f/u later today if HR normal.   Harrell Gave Maitland [place]auer Mobility Specialist Mobility Specialist Phone: [phone]

## 2020-12-16 NOTE — H&P (Signed)
History and Physical    [name] ZOX:[id number] DOB: 24-Sep-1940 DOA: 12/15/2020  PCP: Biagio Borg, MD   Patient coming from: Home   Chief Complaint: Abdominal pain   HPI: [name] is a 80 y.o. male with medical history significant for atrial fibrillation on Eliquis, chronic combined systolic and diastolic CHF, coronary artery disease with stent, OSA, and chronic kidney disease stage IIIa, now presenting to the emergency department with abdominal pain.  Patient developed left lower quadrant abdominal pain the night of 12/09/2020, was seen in the emergency department on 12/11/2020 where he had CT scan findings consistent with acute sigmoid diverticulitis.  He was started on Augmentin and tramadol at that time but has had worsening pain.  He had 2 episodes of vomiting on 12/12/2020, 1 episode of diarrhea on 12/13/2020, denies fevers or chills, and denies melena or hematochezia.  He has had loss of appetite and has not been eating in the last couple days. He took his medications the night of 12/14/20 but not since.   ED Course: Upon arrival to the ED, patient is found to be afebrile, saturating well on room air, and with mild tachycardia and stable BP. EKG features atrial flutter with LBBB. Chemistry panel notable for SCr of 2.13, total bilirubin 1.5, and AST 44. CBC with increased WBC now 15,800. He was given a liter of NS and Zosyn in ED. Surgery was consulted by ED physician.    Review of Systems:  All other systems reviewed and apart from HPI, are negative.  Past Medical History:  Diagnosis Date  . Allergic rhinitis    takes Allegra daily  . Allergic rhinitis, cause unspecified 06/22/2012  . Arthritis   . Atrial fibrillation ([place])    takes Tikosyn and Eliquis daily  . 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 Coronary artery disease    LAD stenting 2004 non-DES  . Depression    took Zoloft 37yrs ago but nothing now  . Diverticulosis   . DVT (deep venous thrombosis) ([place])   .  Dysphagia    occasionally  . History of blood clots 2003   left leg prior to fem pop  . History of colon polyps   . Hyperlipidemia    takes Tricor and Lipitor daily  . Insomnia    takes Ambien nightly as needed  . Joint pain   . Joint swelling   . 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 OSA (obstructive sleep apnea)    CPAP  . Pacemaker   . Peripheral vascular disease ([place])   . Pleurisy    early 24's  . Prostatitis   . Urinary urgency     Past Surgical History:  Procedure Laterality Date  . CARDIAC CATHETERIZATION  2004  . CARDIAC CATHETERIZATION N/A 05/06/2015   Procedure: Left Heart Cath and Coronary Angiography;  Surgeon: Belva Crome, MD;  Location: [facility];  Service: Cardiovascular;  Laterality: N/A;  . COLONOSCOPY    . hand and arm surgery Right    as a teenager  . HAND SURGERY Left   . INGUINAL HERNIA REPAIR Right   . JOINT REPLACEMENT Left December 09, 2013   Left Hip   . JOINT REPLACEMENT Right Aug. 25, 2015   Right Hip  . left knee surgery    . PACEMAKER INSERTION     due to bradycardia  . PERMANENT PACEMAKER GENERATOR CHANGE N/A 06/17/2012   Procedure: PERMANENT PACEMAKER GENERATOR  CHANGE;  Surgeon: Deboraha Sprang, MD;  Location: [facility];  Service: Cardiovascular;  Laterality: N/A;  . right and left fem-pop bypass    . stent (unspec)     x2  . TOTAL HIP ARTHROPLASTY Left 12/09/2013   Procedure: TOTAL HIP ARTHROPLASTY ANTERIOR APPROACH;  Surgeon: Hessie Dibble, MD;  Location: [place] [facility];  Service: Orthopedics;  Laterality: Left;  left anterior total hip arthroplasty  . TOTAL HIP ARTHROPLASTY Right 05/05/2014   Procedure: TOTAL HIP ARTHROPLASTY ANTERIOR APPROACH;  Surgeon: Hessie Dibble, MD;  Location: [place];  Service: Orthopedics;  Laterality: Right;  . TURBINATE REDUCTION    . wisdom extracted       Social History:   reports that he has never smoked. He has never used smokeless tobacco. He reports that he does not drink  alcohol and does not use drugs.  Allergies  Allergen Reactions  . Amiodarone     REACTION: peripheral neuropathy  . Niacin     REACTION: irregular heartbeat  . Ace Inhibitors     REACTION: cough  . Mometasone Furoate Other (See Comments)    (Nasonex) Causes nasal bleeding and nose bleeds    Family History  Problem Relation Age of Onset  . Diabetes Other        family hx  . Sleep apnea Other        family hx  . Diabetes Mother   . Heart disease Mother        Before age 69  . Heart disease Father        Before age 35  . Heart attack Father   . Stroke Father   . Aneurysm Father        bilat pop art aneurysms  . Peripheral vascular disease Father        Popliteal Aneurysm  . Allergic rhinitis Neg Hx   . Angioedema Neg Hx   . Asthma Neg Hx   . Eczema Neg Hx   . Immunodeficiency Neg Hx   . Urticaria Neg Hx      Prior to Admission medications   Medication Sig Start Date End Date Taking? Authorizing Provider  amoxicillin-clavulanate (AUGMENTIN) 875-125 MG tablet Take 1 tablet by mouth 2 (two) times daily. One po bid x 7 days 12/11/20   Charlesetta Shanks, MD  atorvastatin (LIPITOR) 80 MG tablet TAKE 1 TABLET BY MOUTH  DAILY 09/23/20   Deboraha Sprang, MD  CALCIUM PO Take 600 mg by mouth 2 (two) times daily.    [provider]  carvedilol (COREG) 25 MG tablet TAKE 2 TABLETS BY MOUTH  TWICE DAILY 09/23/20   Deboraha Sprang, MD  Cholecalciferol (D3 VITAMIN PO) Take 4,000 Int'l Units by mouth daily.    [provider]  Coenzyme Q10 (COQ-10 PO) Take 300 mg by mouth daily.    [provider]  Cyanocobalamin (VITAMIN B 12 PO) Take 1 tablet by mouth daily.    [provider]  dofetilide (TIKOSYN) 500 MCG capsule TAKE 1 CAPSULE BY MOUTH  TWICE DAILY 09/23/20   Deboraha Sprang, MD  ELIQUIS 5 MG TABS tablet TAKE 1 TABLET BY MOUTH  TWICE DAILY 09/23/20   Deboraha Sprang, MD  fenofibrate 160 MG tablet TAKE 1 TABLET BY MOUTH  DAILY 09/23/20   Deboraha Sprang, MD   fexofenadine (ALLEGRA) 180 MG tablet Take 180 mg by mouth daily.    [provider]  Fluocinonide Emulsified Base 0.05 % CREA APPLY TO  AFFECTED AREA(S)  TWO TIMES DAILY AS NEEDED 01/16/19   Biagio Borg, MD  FOLIC ACID PO Take 1 tablet by mouth daily.    [provider]  GLUCOSAMINE CHONDROITIN COMPLX PO Take 1 tablet by mouth 2 (two) times daily.     [provider]  losartan (COZAAR) 25 MG tablet TAKE 1 TABLET BY MOUTH  DAILY 09/23/20   Deboraha Sprang, MD  Lysine 500 MG TABS Take 500 mg by mouth 3 (three) times daily.    [provider]  Magnesium 250 MG TABS Take 250 mg by mouth 2 (two) times daily.     [provider]  Omega-3 Fatty Acids (FISH OIL) 1200 MG CAPS Take 1,200 mg by mouth daily.     [provider]  potassium chloride (KLOR-CON) 10 MEQ tablet TAKE 1 TABLET BY MOUTH  TWICE DAILY 09/23/20   Deboraha Sprang, MD  ranitidine (ZANTAC) 300 MG tablet Take 1 tablet (300 mg total) by mouth at bedtime. 03/08/17   Kozlow, Donnamarie Poag, MD  ranolazine (RANEXA) 500 MG 12 hr tablet Take 1 tablet (500 mg total) by mouth 2 (two) times daily. 06/01/20   Deboraha Sprang, MD  traMADol Veatrice Bourbon) 50 MG tablet 1-2 tablets every 6 hours as needed for pain 12/11/20   Charlesetta Shanks, MD  triamcinolone (NASACORT) 55 MCG/ACT AERO nasal inhaler Place 2 sprays into both nostrils daily.     [provider]    Physical Exam: Vitals:   12/15/20 2215 12/15/20 2230 12/15/20 2340 12/15/20 2343  BP: 99/72 106/65 107/63   Pulse: 100 (!) 101 100 (!) 101  Resp: (!) 31 (!) 24 20 (!) 27  Temp:      TempSrc:      SpO2: 98% 98% 98% 96%    Constitutional: NAD, calm  Eyes: PERTLA, lids and conjunctivae normal ENMT: Mucous membranes are moist. Posterior pharynx clear of any exudate or lesions.   Neck: normal, supple, no masses, no thyromegaly Respiratory: no wheezing, no crackles. No accessory muscle use.  Cardiovascular: Rate ~100 and irregularly irregular. No  extremity edema.   Abdomen: soft, no guarding, tender in LLQ. Bowel sounds active.  Musculoskeletal: no clubbing / cyanosis. No joint deformity upper and lower extremities.   Skin: no significant rashes, lesions, ulcers. Warm, dry, well-perfused. Neurologic: CN 2-12 grossly intact. Sensation intact. Moving all extremities.  Psychiatric: Alert and oriented to person, place, and situation. Very pleasant and cooperative.    Labs and Imaging on Admission: I have personally reviewed following labs and imaging studies  CBC: Recent Labs  Lab 12/11/20 1828 12/15/20 1614  WBC 11.5* 15.8*  NEUTROABS 9.6*  --   HGB 13.2 14.5  HCT 39.4 44.2  MCV 98.7 98.9  PLT 111* 981   Basic Metabolic Panel: Recent Labs  Lab 12/11/20 1828 12/15/20 1614  NA 138 136  K 4.3 4.9  CL 104 105  CO2 26 20*  GLUCOSE 115* 133*  BUN 22 46*  CREATININE 1.27* 2.13*  CALCIUM 9.6 10.3   GFR: Estimated Creatinine Clearance: 32.5 mL/min (A) (by C-G formula based on SCr of 2.13 mg/dL (H)). Liver Function Tests: Recent Labs  Lab 12/11/20 1828 12/15/20 1614  AST 26 44*  ALT 24 35  ALKPHOS 40 83  BILITOT 1.6* 1.5*  PROT 6.4* 6.5  ALBUMIN 3.8 3.1*   Recent Labs  Lab 12/11/20 1828 12/15/20 1614  LIPASE <10* 22   No results for input(s): AMMONIA in the last 168 hours.  Coagulation Profile: Recent Labs  Lab 12/11/20 1828  INR 1.9*   Cardiac Enzymes: Recent Labs  Lab 12/15/20 2225  CKTOTAL 97   BNP (last 3 results) No results for input(s): PROBNP in the last 8760 hours. HbA1C: No results for input(s): HGBA1C in the last 72 hours. CBG: No results for input(s): GLUCAP in the last 168 hours. Lipid Profile: No results for input(s): CHOL, HDL, LDLCALC, TRIG, CHOLHDL, LDLDIRECT in the last 72 hours. Thyroid Function Tests: No results for input(s): TSH, T4TOTAL, FREET4, T3FREE, THYROIDAB in the last 72 hours. Anemia Panel: No results for input(s): VITAMINB12, FOLATE, FERRITIN, TIBC, IRON,  RETICCTPCT in the last 72 hours. Urine analysis:    Component Value Date/Time   COLORURINE AMBER (A) 12/15/2020 1614   APPEARANCEUR HAZY (A) 12/15/2020 1614   LABSPEC 1.024 12/15/2020 1614   PHURINE 5.0 12/15/2020 1614   GLUCOSEU NEGATIVE 12/15/2020 1614   GLUCOSEU NEGATIVE 06/23/2020 1044   HGBUR NEGATIVE 12/15/2020 1614   BILIRUBINUR NEGATIVE 12/15/2020 1614   KETONESUR 5 (A) 12/15/2020 1614   PROTEINUR NEGATIVE 12/15/2020 1614   UROBILINOGEN 1.0 06/23/2020 1044   NITRITE NEGATIVE 12/15/2020 1614   LEUKOCYTESUR TRACE (A) 12/15/2020 1614   Sepsis Labs: @LABRCNTIP (procalcitonin:4,lacticidven:4) )No results found for this or any previous visit (from the past 240 hour(s)).   Radiological Exams on Admission: CT ABDOMEN PELVIS WO CONTRAST  Result Date: 12/15/2020 CLINICAL DATA:  Acute abdominal pain. Diagnosed with diverticulitis on Saturday and lower abdominal pain since then. EXAM: CT ABDOMEN AND PELVIS WITHOUT CONTRAST TECHNIQUE: Multidetector CT imaging of the abdomen and pelvis was performed following the standard protocol without IV contrast. COMPARISON:  12/11/2020 FINDINGS: Lower chest: Mild dependent changes in the lung bases. Cardiac enlargement. Hepatobiliary: Probable hepatic cirrhosis with atrophy of the medial segment left lobe and nodular contour to the liver. Gallbladder and bile ducts are unremarkable. Pancreas: Diffuse fatty replacement of the pancreas. No inflammatory changes or ductal dilatation. Spleen: Normal in size without focal abnormality. Adrenals/Urinary Tract: Adrenal glands are unremarkable. Kidneys are normal, without renal calculi, focal lesion, or hydronephrosis. Bladder is unremarkable. Stomach/Bowel: Stomach is not abnormally distended. Significant progression of previous inflammatory process in the pelvis. This likely represents progression of the diverticulitis. There is extensive inflammatory stranding now seen throughout the pelvic fat with dilated  fluid-filled small bowel, possibly due to small-bowel obstruction or reactive change. There is new free intra-abdominal air consistent with perforation. No loculated abscess collection is identified. Vascular/Lymphatic: Aortic atherosclerosis. No enlarged abdominal or pelvic lymph nodes. Reproductive: Prostate gland is not enlarged. Other: Small amount of free fluid in the abdomen. Abdominal wall musculature appears intact. Musculoskeletal: Bilateral hip arthroplasties. Degenerative changes in the spine. IMPRESSION: 1. Significant progression of previous inflammatory process in the pelvis likely representing progression of the diverticulitis. New free intra-abdominal air consistent with perforation. Inflammatory stranding and edema throughout the pelvic fat. Developing small bowel dilatation with fluid-filled loops, probably reactive. No loculated abscess collection is identified. 2. Probable hepatic cirrhosis. 3. Small amount of free fluid in the abdomen. 4. Aortic atherosclerosis. Aortic Atherosclerosis (ICD10-I70.0). Critical Value/emergent results were called by telephone at the time of interpretation on 12/15/2020 at 8:39 pm to provider [facility] , who verbally acknowledged these results. Electronically Signed   By: Lucienne Capers M.D.   On: 12/15/2020 20:44   DG Chest Portable 1 View  Result Date: 12/15/2020 CLINICAL DATA:  Shortness of breath.  Diverticulitis. EXAM: PORTABLE CHEST 1 VIEW COMPARISON:  04/19/2016 FINDINGS: Mild cardiac enlargement. Shallow inspiration with  linear atelectasis in the lung bases. No consolidation or airspace disease. No pleural effusions. No pneumothorax. Cardiac pacemaker. IMPRESSION: Shallow inspiration with linear atelectasis in the lung bases. Electronically Signed   By: Lucienne Capers M.D.   On: 12/15/2020 23:22    EKG: Independently reviewed. Atrial flutter, rate 98, PVC, LBBB, LVH with repolarization abnormality.   Assessment/Plan  1. Diverticulitis with  perforation  - Diagnosed with sigmoid diverticulitis on 4/2 and now presents with worsening LLQ pain despite Augmentin and tramadol and is found to have progressive inflammatory changes and new free air  - Surgery consulting and much appreciated  - No peritoneal signs on admission, lactate normal  - Continue Zosyn, bowel-rest, hold Eliquis, pain-control   2. Atrial fibrillation  - In rate-controlled atrial fibrillation in ED  - Last dose of Eliquis was pm of 12/14/20  - CHADS-VASc at least 5 (age x2, CAD, CHF, HTN)  - Hold Eliquis, use IV heparin for now for CVA prevention    3. Chronic combined systolic and diastolic dysfunction  - Appears compensated  - Echo from March 2022 with EF 19-50%, grade 2 diastolic dysfunction, severe LAE - Continue IVF hydration in setting of AKI, monitor weight and I/Os, continue Coreg as tolerated    4. CAD - He has been exercising regularly without angina  - Continue beta-blocker as tolerated   5. Acute kidney injury superimposed on CKD IIIa  - SCr is 2.13 on admission, up from 1.27 on 12/11/20 - No hydronephrosis on CT  - Likely acute prerenal azotemia in setting of anorexia  - Continue IVF hydration, hold losartan, renally-dose medications    DVT prophylaxis: Eliquis pta, IV heparin for now  Code Status: Full  Level of Care: Level of care: Progressive Family Communication: Wife updated at bedside Disposition Plan:  Patient is from: Home  Anticipated d/c is to: TBD Anticipated d/c date is: ~12/20/20 Patient currently: Pending surgical consultation  Consults called: Surgery  Admission status: Inpatient     Vianne Bulls, MD [facility]  12/16/2020, 12:57 AM

## 2020-12-16 NOTE — ED Notes (Signed)
Pt up to bathroom with steady gait. No assistance required.

## 2020-12-16 NOTE — Progress Notes (Signed)
ANTICOAGULATION CONSULT NOTE - Initial Consult  Pharmacy Consult for Heparin (Apixaban on hold) Indication: atrial fibrillation  Allergies  Allergen Reactions  . Amiodarone     REACTION: peripheral neuropathy  . Niacin     REACTION: irregular heartbeat  . Ace Inhibitors     REACTION: cough  . Mometasone Furoate Other (See Comments)    (Nasonex) Causes nasal bleeding and nose bleeds    Vital Signs: Temp: 97.6 F (36.4 C) (04/07 1130) Temp Source: Oral (04/07 1130) BP: 96/66 (04/07 1130) Pulse Rate: 95 (04/07 1130)  Labs: Recent Labs    12/15/20 1614 12/15/20 2225 12/16/20 0200 12/16/20 1007  HGB 14.5  --  12.4*  --   HCT 44.2  --  37.5*  --   PLT 269  --  214  --   APTT  --   --   --  43*  HEPARINUNFRC  --   --   --  1.98*  CREATININE 2.13*  --  1.70*  --   CKTOTAL  --  97  --   --     Estimated Creatinine Clearance: 40.7 mL/min (A) (by C-G formula based on SCr of 1.7 mg/dL (H)).   Medical History: Past Medical History:  Diagnosis Date  . Allergic rhinitis    takes Allegra daily  . Allergic rhinitis, cause unspecified 06/22/2012  . Arthritis   . Atrial fibrillation ([place])    takes Tikosyn and Eliquis daily  . 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 Coronary artery disease    LAD stenting 2004 non-DES  . Depression    took Zoloft 73yrs ago but nothing now  . Diverticulosis   . DVT (deep venous thrombosis) ([place])   . Dysphagia    occasionally  . History of blood clots 2003   left leg prior to fem pop  . History of colon polyps   . Hyperlipidemia    takes Tricor and Lipitor daily  . Insomnia    takes Ambien nightly as needed  . Joint pain   . Joint swelling   . 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 OSA (obstructive sleep apnea)    CPAP  . Pacemaker   . Peripheral vascular disease ([place])   . Pleurisy    early 47's  . Prostatitis   . Urinary urgency     Assessment: 80 y/o M with abdominal pain found to have  diverticulitis/perforation. On apixaban PTA for afib. Holding apixaban and starting heparin in case surgery is needed. Last dose on apixaban was 4/5 PM>>ok to start heparin now. Anticipate using aPTT to dose. CBC good. Noted renal dysfunction.   Heparin level elevated at 1.98 as expected, aptt below desired goal at 43s. No bleeding issues noted.   Goal of Therapy:  Heparin level 0.3-0.7 units/ml aPTT 66-102 seconds Monitor platelets by anticoagulation protocol: Yes   Plan:  Increase heparin drip to 1500 units/hr 2200 aPTT Daily CBC/aPTT/heparin level Monitor for bleeding  Erin Hearing PharmD., BCPS Clinical Pharmacist 12/16/2020 1:16 PM

## 2020-12-16 NOTE — ED Notes (Signed)
Attempted report x1. 

## 2020-12-16 NOTE — Progress Notes (Signed)
Pt arrived to 4e25 from ED. Ambulated to the bed independently. CHG bath given. Tele placed. CCMD notified. VSS at this time. Pt c/o of left lower quad tenderness and discomfort at this time. Educated to the unit. Call bell within reach. Will continue to monitor.   Fransico Michael, RN

## 2020-12-16 NOTE — Consult Note (Signed)
Cardiology Consultation:  Patient ID: [name] MRN: [id number]; DOB: 1941/07/30  Admit date: 12/15/2020 Date of Consult: 12/16/2020  Primary Care Provider: Biagio Borg, MD Primary Cardiologist: No primary care provider on file.  Primary Electrophysiologist:  None   Patient Profile:  [name] is a 80 y.o. 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  History of Present Illness:  [name] presents with acute abdominal pain and found to have perforated diverticulitis.  Cardiology was consulted for preoperative assessment.  He has a known history of nonobstructive CAD.  Left heart catheterization in 2016 showed diffuse moderate CAD.  He had a Lexiscan nuclear medicine stress test in February of this year that was normal without evidence of ischemia.  He also has had a longstanding history of atrial fibrillation.  He also has sick sinus syndrome with pacemaker implantation.  He remains on Tikosyn.  He is here in the hospital with perforated diverticulitis.  It appears they are trying to treat him medically.  He may need surgery and cardiology was asked for assessment.  Labs on arrival were notable for significant AKI with creatinine 2.1.  EGFR 31.  This is deviation from his baseline creatinine of 1.2.  White count elevated 15.8.  He has been given intravenous fluids with creatinine decreasing to 1.70.  Chest x-ray shows no evidence of pulmonary edema.  Lactic acid is normal.  Blood cultures negative.  Review of telemetry shows he is in and out of atrial fibrillation.  Electrophysiology was consulted and have recommended to continue with Tikosyn despite AKI.  He reports overall his symptoms are improving.  He denies any significant abdominal pain.  He does report  bloating.  He denies any chest pain or shortness of breath.  Prior to this episode of diverticulitis he was able to walk and do activities without any limitations.  He was able to climb a flight of stairs with out significant chest pain or pressure.  He was given a 1 L bolus on arrival.  He has been given several hours of 125 cc of lactated Ringer's.  I have put a stop time on this at midnight.  This will effectively be 2 to 2.5 L of fluid resuscitation.  This should be enough given his EF of 40 to 45%.  Heart Pathway Score:       Past Medical History: Past Medical History:  Diagnosis Date  . Allergic rhinitis    takes Allegra daily  . Allergic rhinitis, cause unspecified 06/22/2012  . Arthritis   . Atrial fibrillation ([place])    takes Tikosyn and Eliquis daily  . 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 Coronary artery disease    LAD stenting 2004 non-DES  . Depression    took Zoloft 36yrs ago but nothing now  . Diverticulosis   . DVT (deep venous thrombosis) ([place])   . Dysphagia    occasionally  . History of blood clots 2003   left leg prior to fem pop  . History of colon polyps   . Hyperlipidemia    takes Tricor and Lipitor daily  . Insomnia    takes Ambien nightly as needed  . Joint pain   . Joint swelling   . 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  OSA (obstructive sleep apnea)    CPAP  . Pacemaker   . Peripheral vascular disease (HCC)   . Pleurisy    early 66's  . Prostatitis   . Urinary urgency     Past Surgical History: Past Surgical History:  Procedure Laterality Date  . CARDIAC CATHETERIZATION  2004  . CARDIAC CATHETERIZATION N/A 05/06/2015   Procedure: Left Heart Cath and Coronary Angiography;  Surgeon: [name] Records, MD;  Location: [facility] INVASIVE CV LAB;  Service: Cardiovascular;  Laterality: N/A;  . COLONOSCOPY    . hand and arm surgery Right    as a teenager  . HAND SURGERY Left   . INGUINAL HERNIA REPAIR Right   . JOINT REPLACEMENT  Left December 09, 2013   Left Hip   . JOINT REPLACEMENT Right Aug. 25, 2015   Right Hip  . left knee surgery    . PACEMAKER INSERTION     due to bradycardia  . PERMANENT PACEMAKER GENERATOR CHANGE N/A 06/17/2012   Procedure: PERMANENT PACEMAKER GENERATOR CHANGE;  Surgeon: [name] Salvia, MD;  Location: [name] CATH LAB;  Service: Cardiovascular;  Laterality: N/A;  . right and left fem-pop bypass    . stent (unspec)     x2  . TOTAL HIP ARTHROPLASTY Left 12/09/2013   Procedure: TOTAL HIP ARTHROPLASTY ANTERIOR APPROACH;  Surgeon: Velna Ochs, MD;  Location: [facility] OR;  Service: Orthopedics;  Laterality: Left;  left anterior total hip arthroplasty  . TOTAL HIP ARTHROPLASTY Right 05/05/2014   Procedure: TOTAL HIP ARTHROPLASTY ANTERIOR APPROACH;  Surgeon: Velna Ochs, MD;  Location: [facility] OR;  Service: Orthopedics;  Laterality: Right;  . TURBINATE REDUCTION    . wisdom extracted        Home Medications:  Prior to Admission medications   Medication Sig Start Date End Date Taking? Authorizing Provider  amoxicillin-clavulanate (AUGMENTIN) 875-125 MG tablet Take 1 tablet by mouth 2 (two) times daily. One po bid x 7 days 12/11/20   [name] Barrette, MD  atorvastatin (LIPITOR) 80 MG tablet TAKE 1 TABLET BY MOUTH  DAILY 09/23/20   [name] Salvia, MD  CALCIUM PO Take 600 mg by mouth 2 (two) times daily.    [provider]  carvedilol (COREG) 25 MG tablet TAKE 2 TABLETS BY MOUTH  TWICE DAILY 09/23/20   [name] Salvia, MD  Cholecalciferol (D3 VITAMIN PO) Take 4,000 Int'l Units by mouth daily.    [provider]  Coenzyme Q10 (COQ-10 PO) Take 300 mg by mouth daily.    [provider]  Cyanocobalamin (VITAMIN B 12 PO) Take 1 tablet by mouth daily.    [provider]  dofetilide (TIKOSYN) 500 MCG capsule TAKE 1 CAPSULE BY MOUTH  TWICE DAILY 09/23/20   [name] Salvia, MD  ELIQUIS 5 MG TABS tablet TAKE 1 TABLET BY MOUTH  TWICE DAILY 09/23/20   [name] Salvia, MD  fenofibrate 160  MG tablet TAKE 1 TABLET BY MOUTH  DAILY 09/23/20   [name] Salvia, MD  fexofenadine (ALLEGRA) 180 MG tablet Take 180 mg by mouth daily.    [provider]  Fluocinonide Emulsified Base 0.05 % CREA APPLY TO AFFECTED AREA(S)  TWO TIMES DAILY AS NEEDED 01/16/19   [name], MD  FOLIC ACID PO Take 1 tablet by mouth daily.    [provider]  GLUCOSAMINE CHONDROITIN COMPLX PO Take 1 tablet by mouth 2 (two) times daily.     [provider]  levofloxacin (LEVAQUIN) 500  MG tablet Take 500 mg by mouth daily. 12/12/20   [provider]  losartan (COZAAR) 25 MG tablet TAKE 1 TABLET BY MOUTH  DAILY 09/23/20   [name] Salvia, MD  Lysine 500 MG TABS Take 500 mg by mouth 3 (three) times daily.    [provider]  Magnesium 250 MG TABS Take 250 mg by mouth 2 (two) times daily.     [provider]  metroNIDAZOLE (FLAGYL) 500 MG tablet Take 500 mg by mouth 2 (two) times daily. 12/12/20   [provider]  Omega-3 Fatty Acids (FISH OIL) 1200 MG CAPS Take 1,200 mg by mouth daily.     [provider]  potassium chloride (KLOR-CON) 10 MEQ tablet TAKE 1 TABLET BY MOUTH  TWICE DAILY 09/23/20   [name] Salvia, MD  ranitidine (ZANTAC) 300 MG tablet Take 1 tablet (300 mg total) by mouth at bedtime. 03/08/17   Kozlow, Alvira Philips, MD  ranolazine (RANEXA) 500 MG 12 hr tablet Take 1 tablet (500 mg total) by mouth 2 (two) times daily. 06/01/20   [name] Salvia, MD  traMADol Janean Sark) 50 MG tablet 1-2 tablets every 6 hours as needed for pain 12/11/20   [name] Barrette, MD  triamcinolone (NASACORT) 55 MCG/ACT AERO nasal inhaler Place 2 sprays into both nostrils daily.     [provider]    Inpatient Medications: Scheduled Meds: . carvedilol  50 mg Oral BID WC  . [START ON 12/17/2020] dofetilide  500 mcg Oral BID  . pantoprazole (PROTONIX) IV  40 mg Intravenous Daily   Continuous Infusions: . heparin 1,500 Units/hr (12/16/20 1404)  . lactated ringers 125  mL/hr at 12/16/20 1018  . piperacillin-tazobactam (ZOSYN)  IV 3.375 g (12/16/20 1403)   PRN Meds: acetaminophen **OR** acetaminophen, HYDROmorphone (DILAUDID) injection, metoprolol tartrate, ondansetron **OR** ondansetron (ZOFRAN) IV  Allergies:    Allergies  Allergen Reactions  . Amiodarone Other (See Comments)    Peripheral neuropathy resulted  . Niacin Other (See Comments)    Caused an irregular heartbeat  . Ace Inhibitors Cough  . Mometasone Furoate Other (See Comments)    (Nasonex) Causes nasal bleeding and nose bleeds    Social History:   Social History   Socioeconomic History  . Marital status: Married    Spouse name: Not on file  . Number of children: Not on file  . Years of education: Not on file  . Highest education level: Not on file  Occupational History  . Not on file  Tobacco Use  . Smoking status: Never Smoker  . Smokeless tobacco: Never Used  Vaping Use  . Vaping Use: Never used  Substance and Sexual Activity  . Alcohol use: No    Alcohol/week: 0.0 standard drinks    Comment: nothing since 2000  . Drug use: No  . Sexual activity: Yes  Other Topics Concern  . 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    Social Determinants of Health   Financial Resource Strain: Low Risk   . Difficulty of Paying Living Expenses: Not hard at all  Food Insecurity: No Food Insecurity  . Worried About Programme researcher, broadcasting/film/video in the Last Year: Never true  . Ran Out of Food in the Last Year: Never true  Transportation Needs: No Transportation Needs  . Lack of Transportation (Medical): No  . Lack of Transportation (Non-Medical): No  Physical Activity: Sufficiently Active  . Days of Exercise per  Week: 7 days  . Minutes of Exercise per Session: 60 min  Stress: No Stress Concern Present  . Feeling of Stress : Not at all  Social Connections: Socially Integrated  . Frequency of Communication with Friends and Family: More than  three times a week  . Frequency of Social Gatherings with Friends and Family: More than three times a week  . Attends Religious Services: More than 4 times per year  . Active Member of Clubs or Organizations: Yes  . Attends Archivist Meetings: More than 4 times per year  . Marital Status: Married  Human resources officer Violence: Not on file     Family History:    Family History  Problem Relation Age of Onset  . Diabetes Other        family hx  . Sleep apnea Other        family hx  . Diabetes Mother   . Heart disease Mother        Before age 81  . Heart disease Father        Before age 79  . Heart attack Father   . Stroke Father   . Aneurysm Father        bilat pop art aneurysms  . Peripheral vascular disease Father        Popliteal Aneurysm  . Allergic rhinitis Neg Hx   . Angioedema Neg Hx   . Asthma Neg Hx   . Eczema Neg Hx   . Immunodeficiency Neg Hx   . Urticaria Neg Hx      ROS:  All other ROS reviewed and negative. Pertinent positives noted in the HPI.     Physical Exam/Data:   Vitals:   12/16/20 0811 12/16/20 1003 12/16/20 1130 12/16/20 1505  BP: 119/65 (!) 1$RemoveBef'02/54 96/66 97/67 'MYhvQaKPST$  Pulse: 67  95 76  Resp: $Remo'20 20 18 20  'gwnBG$ Temp: 97.6 F (36.4 C)  97.6 F (36.4 C) (!) 97.4 F (36.3 C)  TempSrc: Oral  Oral Oral  SpO2: 97%  92% 99%  Weight:      Height:         Intake/Output Summary (Last 24 hours) at 12/16/2020 1635 Last data filed at 12/16/2020 1515 Gross per 24 hour  Intake 1185.14 ml  Output --  Net 1185.14 ml    Last 3 Weights 12/16/2020 12/11/2020 11/02/2020  Weight (lbs) 220 lb 1.6 oz 206 lb 218 lb  Weight (kg) 99.837 kg 93.441 kg 98.884 kg    Body mass index is 27.51 kg/m.  General: Well nourished, well developed, in no acute distress Head: Atraumatic, normal size  Eyes: PEERLA, EOMI  Neck: Supple, no JVD Endocrine: No thryomegaly Cardiac: Normal S1, S2; RRR; no murmurs, rubs, or gallops Lungs: Diminished breath sounds at the lung bases Abd:  Soft, distended abdomen, no rebound Ext: No edema, pulses 2+ Musculoskeletal: No deformities, BUE and BLE strength normal and equal Skin: Warm and dry, no rashes   Neuro: Alert and oriented to person, place, time, and situation, CNII-XII grossly intact, no focal deficits  Psych: Normal mood and affect   EKG:  The EKG was personally reviewed and demonstrates: Atrial fibrillation heart rate 125, left bundle branch block noted Telemetry:  Telemetry was personally reviewed and demonstrates: Sinus rhythm with intermittent a pacing, paroxysms of atrial fibrillation noted  Relevant CV Studies: TTE 11/24/2020 1. Left ventricular ejection fraction, by estimation, is 40 to 45%. The  left ventricle has mildly decreased function. The left ventricle  demonstrates regional  wall motion abnormalities. Abnormal (paradoxical)  septal motion, consistent with left bundle  branch block. The left ventricular internal cavity size was mildly  dilated. Left ventricular diastolic parameters are consistent with Grade  II diastolic dysfunction (pseudonormalization). Elevated left atrial  pressure.  2. Right ventricular systolic function is normal. The right ventricular  size is moderately enlarged. There is normal pulmonary artery systolic  pressure. The estimated right ventricular systolic pressure is 70.6 mmHg.  3. Left atrial size was severely dilated.  4. Right atrial size was mildly dilated.  5. The mitral valve is normal in structure. Mild mitral valve  regurgitation.  6. The aortic valve is tricuspid. Aortic valve regurgitation is not  visualized. Mild to moderate aortic valve sclerosis/calcification is  present, without any evidence of aortic stenosis.  7. The inferior vena cava is normal in size with greater than 50%  respiratory variability, suggesting right atrial pressure of 3 mmHg.   NM Stress 11/02/2020   The left ventricular ejection fraction is severely decreased (<30%).  Nuclear  stress EF: 20%.  No T wave inversion was noted during stress.  There was no ST segment deviation noted during stress.  This is a high risk study.   No reversible ischemia. LVEF 20% with severe global hypokinesis. This is a high risk study due to LVEF.    Laboratory Data: High Sensitivity Troponin:  No results for input(s): TROPONINIHS in the last 720 hours.   Cardiac EnzymesNo results for input(s): TROPONINI in the last 168 hours. No results for input(s): TROPIPOC in the last 168 hours.  Chemistry Recent Labs  Lab 12/11/20 1828 12/15/20 1614 12/16/20 0200  NA 138 136 136  K 4.3 4.9 3.4*  CL 104 105 108  CO2 26 20* 21*  GLUCOSE 115* 133* 108*  BUN 22 46* 44*  CREATININE 1.27* 2.13* 1.70*  CALCIUM 9.6 10.3 8.3*  GFRNONAA 57* 31* 40*  ANIONGAP $RemoveB'8 11 7    'krtVEqzV$ Recent Labs  Lab 12/11/20 1828 12/15/20 1614 12/16/20 0200  PROT 6.4* 6.5 5.0*  ALBUMIN 3.8 3.1* 2.3*  AST 26 44* 30  ALT 24 35 26  ALKPHOS 40 83 54  BILITOT 1.6* 1.5* 1.1   Hematology Recent Labs  Lab 12/11/20 1828 12/15/20 1614 12/16/20 0200  WBC 11.5* 15.8* 11.4*  RBC 3.99* 4.47 3.76*  HGB 13.2 14.5 12.4*  HCT 39.4 44.2 37.5*  MCV 98.7 98.9 99.7  MCH 33.1 32.4 33.0  MCHC 33.5 32.8 33.1  RDW 14.5 14.6 14.6  PLT 111* 269 214   BNPNo results for input(s): BNP, PROBNP in the last 168 hours.  DDimer No results for input(s): DDIMER in the last 168 hours.  Radiology/Studies:  CT ABDOMEN PELVIS WO CONTRAST  Result Date: 12/15/2020 CLINICAL DATA:  Acute abdominal pain. Diagnosed with diverticulitis on Saturday and lower abdominal pain since then. EXAM: CT ABDOMEN AND PELVIS WITHOUT CONTRAST TECHNIQUE: Multidetector CT imaging of the abdomen and pelvis was performed following the standard protocol without IV contrast. COMPARISON:  12/11/2020 FINDINGS: Lower chest: Mild dependent changes in the lung bases. Cardiac enlargement. Hepatobiliary: Probable hepatic cirrhosis with atrophy of the medial segment left lobe  and nodular contour to the liver. Gallbladder and bile ducts are unremarkable. Pancreas: Diffuse fatty replacement of the pancreas. No inflammatory changes or ductal dilatation. Spleen: Normal in size without focal abnormality. Adrenals/Urinary Tract: Adrenal glands are unremarkable. Kidneys are normal, without renal calculi, focal lesion, or hydronephrosis. Bladder is unremarkable. Stomach/Bowel: Stomach is not abnormally distended. Significant progression of previous  inflammatory process in the pelvis. This likely represents progression of the diverticulitis. There is extensive inflammatory stranding now seen throughout the pelvic fat with dilated fluid-filled small bowel, possibly due to small-bowel obstruction or reactive change. There is new free intra-abdominal air consistent with perforation. No loculated abscess collection is identified. Vascular/Lymphatic: Aortic atherosclerosis. No enlarged abdominal or pelvic lymph nodes. Reproductive: Prostate gland is not enlarged. Other: Small amount of free fluid in the abdomen. Abdominal wall musculature appears intact. Musculoskeletal: Bilateral hip arthroplasties. Degenerative changes in the spine. IMPRESSION: 1. Significant progression of previous inflammatory process in the pelvis likely representing progression of the diverticulitis. New free intra-abdominal air consistent with perforation. Inflammatory stranding and edema throughout the pelvic fat. Developing small bowel dilatation with fluid-filled loops, probably reactive. No loculated abscess collection is identified. 2. Probable hepatic cirrhosis. 3. Small amount of free fluid in the abdomen. 4. Aortic atherosclerosis. Aortic Atherosclerosis (ICD10-I70.0). Critical Value/emergent results were called by telephone at the time of interpretation on 12/15/2020 at 8:39 pm to provider [facility] , who verbally acknowledged these results. Electronically Signed   By: Lucienne Capers M.D.   On: 12/15/2020 20:44    DG Chest Portable 1 View  Result Date: 12/15/2020 CLINICAL DATA:  Shortness of breath.  Diverticulitis. EXAM: PORTABLE CHEST 1 VIEW COMPARISON:  04/19/2016 FINDINGS: Mild cardiac enlargement. Shallow inspiration with linear atelectasis in the lung bases. No consolidation or airspace disease. No pleural effusions. No pneumothorax. Cardiac pacemaker. IMPRESSION: Shallow inspiration with linear atelectasis in the lung bases. Electronically Signed   By: Lucienne Capers M.D.   On: 12/15/2020 23:22    Assessment and Plan:   1. Preoperative Assessment -Presents with perforated diverticulitis.  It appears surgery plans to treat him medically for now.  He has no evidence of peritonitis on examination. -He reports no angina.  He had a stress test earlier this year that was normal without evidence of ischemia.  He has known nonobstructive CAD. -He does not appear to be in congestive heart failure.  He has been given 1 L of intravenous fluids as bolus on admission.  He has also received roughly an additional liter of intravenous fluids over several hours.  I have put a stop time on his fluids.  We should be cautious and judicious with these.  I do understand he has an AKI however his EF is 40%.  I do not want to put him into congestive heart failure. -We will see how he does.  I will follow up tomorrow on urine output and how his kidney function is doing. -Regarding any potential surgery, he may proceed as surgery sees fit.  He is high risk given his age, comorbidities including congestive heart failure, nonobstructive CAD and atrial fibrillation however he is able to complete greater than 4 METS.   -He may proceed to surgery if needed. No further cardiac testing.   2. Acute Systolic HF, 99-83% -holding ACEi in setting of AKI -continue coreg  -cautious with fluids as mentioned above  -not in congestive heart failure currently  3. Paroxysmal Afib -having paroxysms of Afib -EP following and will  continue tikosyn  -on heparin drip in case surgery is needed -restart DOAC once no procedures are potentially planned   For questions or updates, please contact [place] Please consult [email or web address] for contact info under   Signed, [place] T. Audie Box, MD, [place] [facility]  12/16/2020 4:35 PM

## 2020-12-16 NOTE — Progress Notes (Signed)
[facility] PLAN OF CARE NOTE Patient: [name] ESP:[id number]   PCP: Biagio Borg, MD DOB: 1941-08-23   DOA: 12/15/2020   DOS: 12/16/2020    Patient was admitted by my colleague earlier on 12/16/2020. I have reviewed the H&P as well as assessment and plan and agree with the same. Important changes in the plan are listed below.  Plan of care: Principal Problem:   Diverticulitis of colon with perforation Active Problems:   OBSTRUCTIVE SLEEP APNEA   Atrial fibrillation (HCC)   Chronic combined systolic and diastolic CHF (congestive heart failure) (HCC)   Acute renal failure superimposed on stage 3a chronic kidney disease (HCC)   Severe sepsis [facility])   I consulted cardiology for Tikosyn as well as for preop surgical clearance. I consulted general surgery.  Author: Berle Mull, MD [facility] 12/16/2020 8:37 PM   If 7PM-7AM, please contact night-coverage at [email or web address]

## 2020-12-16 NOTE — Progress Notes (Signed)
Pharmacy Antibiotic Note  [name] is a 80 y.o. male admitted on 12/15/2020 with intra-abdominal infection.  Pharmacy has been consulted for Zosyn dosing. WBC is elevated. Noted renal dysfunction.   Plan: Zosyn 3.375G IV q8h to be infused over 4 hours Trend WBC, temp, renal function  F/U infectious work-up  Temp (24hrs), Avg:97.8 F (36.6 C), Min:97.5 F (36.4 C), Max:98 F (36.7 C)  Recent Labs  Lab 12/11/20 1828 12/11/20 1829 12/15/20 1614 12/15/20 2339  WBC 11.5*  --  15.8*  --   CREATININE 1.27*  --  2.13*  --   LATICACIDVEN  --  1.1  --  1.9    Estimated Creatinine Clearance: 32.5 mL/min (A) (by C-G formula based on SCr of 2.13 mg/dL (H)).    Allergies  Allergen Reactions  . Amiodarone     REACTION: peripheral neuropathy  . Niacin     REACTION: irregular heartbeat  . Ace Inhibitors     REACTION: cough  . Mometasone Furoate Other (See Comments)    (Nasonex) Causes nasal bleeding and nose bleeds    Narda Bonds, PharmD, BCPS Clinical Pharmacist Phone: [phone]

## 2020-12-17 DIAGNOSIS — I5021 Acute systolic (congestive) heart failure: Secondary | ICD-10-CM | POA: Diagnosis not present

## 2020-12-17 DIAGNOSIS — K5720 Diverticulitis of large intestine with perforation and abscess without bleeding: Secondary | ICD-10-CM | POA: Diagnosis not present

## 2020-12-17 DIAGNOSIS — I480 Paroxysmal atrial fibrillation: Secondary | ICD-10-CM | POA: Diagnosis not present

## 2020-12-17 LAB — APTT
aPTT: 61 seconds — ABNORMAL HIGH (ref 24–36)
aPTT: 117 seconds — ABNORMAL HIGH (ref 24–36)
aPTT: 123 seconds — ABNORMAL HIGH (ref 24–36)

## 2020-12-17 LAB — BASIC METABOLIC PANEL
Anion gap: 11 (ref 5–15)
BUN: 46 mg/dL — ABNORMAL HIGH (ref 8–23)
CO2: 21 mmol/L — ABNORMAL LOW (ref 22–32)
Calcium: 9.5 mg/dL (ref 8.9–10.3)
Chloride: 106 mmol/L (ref 98–111)
Creatinine, Ser: 1.65 mg/dL — ABNORMAL HIGH (ref 0.61–1.24)
GFR, Estimated: 41 mL/min — ABNORMAL LOW (ref >60)
Glucose, Bld: 102 mg/dL — ABNORMAL HIGH (ref 70–99)
Potassium: 4 mmol/L (ref 3.5–5.1)
Sodium: 138 mmol/L (ref 135–145)

## 2020-12-17 LAB — HEPARIN LEVEL (UNFRACTIONATED): Heparin Unfractionated: 1.48 IU/mL — ABNORMAL HIGH (ref 0.30–0.70)

## 2020-12-17 LAB — CBC
HCT: 39.6 % (ref 39.0–52.0)
Hemoglobin: 13.5 g/dL (ref 13.0–17.0)
MCH: 32.8 pg (ref 26.0–34.0)
MCHC: 34.1 g/dL (ref 30.0–36.0)
MCV: 96.1 fL (ref 80.0–100.0)
Platelets: 250 10*3/uL (ref 150–400)
RBC: 4.12 MIL/uL — ABNORMAL LOW (ref 4.22–5.81)
RDW: 14.6 % (ref 11.5–15.5)
WBC: 10.4 10*3/uL (ref 4.0–10.5)
nRBC: 0 % (ref 0.0–0.2)

## 2020-12-17 LAB — MAGNESIUM: Magnesium: 2.1 mg/dL (ref 1.7–2.4)

## 2020-12-17 MED ORDER — LACTATED RINGERS IV SOLN: INTRAVENOUS | Status: DC

## 2020-12-17 MED ORDER — DOFETILIDE 500 MCG PO CAPS
500.0000 ug | ORAL_CAPSULE | Freq: Two times a day (BID) | ORAL | Status: DC
  Administered 2020-12-17 – 2020-12-18 (×2): 500 ug via ORAL
  Filled 2020-12-17 (×3): qty 1

## 2020-12-17 MED ORDER — DOFETILIDE 500 MCG PO CAPS
500.0000 ug | ORAL_CAPSULE | Freq: Two times a day (BID) | ORAL | Status: DC
  Filled 2020-12-17: qty 1

## 2020-12-17 MED ORDER — DOFETILIDE 500 MCG PO CAPS
500.0000 ug | ORAL_CAPSULE | Freq: Two times a day (BID) | ORAL | Status: DC
  Administered 2020-12-17: 500 ug via ORAL
  Filled 2020-12-17 (×2): qty 1

## 2020-12-17 NOTE — Progress Notes (Signed)
Cardiology Progress Note  Patient ID: [name] MRN: [id number] DOB: 08/04/1941 Date of Encounter: 12/17/2020  Primary Cardiologist: No primary care provider on file.  Subjective   Chief Complaint: Abdominal pain  HPI: Abdominal pain is improving.  Diet being advanced today.  A. fib with RVR this morning.  ROS:  All other ROS reviewed and negative. Pertinent positives noted in the HPI.     Inpatient Medications  Scheduled Meds: . carvedilol  50 mg Oral BID WC  . dofetilide  500 mcg Oral BID  . pantoprazole (PROTONIX) IV  40 mg Intravenous Daily   Continuous Infusions: . heparin 1,900 Units/hr (12/17/20 1003)  . piperacillin-tazobactam (ZOSYN)  IV 3.375 g (12/17/20 0652)   PRN Meds: acetaminophen **OR** acetaminophen, HYDROmorphone (DILAUDID) injection, metoprolol tartrate, ondansetron **OR** ondansetron (ZOFRAN) IV   Vital Signs   Vitals:   12/16/20 2340 12/17/20 0337 12/17/20 0728 12/17/20 1101  BP: 107/67 (!) 99/56 (!) 105/52 100/65  Pulse: 100 81 67 73  Resp: 14 20 19 18   Temp: 97.6 F (36.4 C) 97.6 F (36.4 C) 98.2 F (36.8 C) 98.2 F (36.8 C)  TempSrc: Oral Oral Oral Oral  SpO2: 94% 98% 92% 95%  Weight:  100.5 kg    Height:        Intake/Output Summary (Last 24 hours) at 12/17/2020 1123 Last data filed at 12/17/2020 0200 Gross per 24 hour  Intake 2110.14 ml  Output 375 ml  Net 1735.14 ml   Last 3 Weights 12/17/2020 12/16/2020 12/11/2020  Weight (lbs) 221 lb 9.6 oz 220 lb 1.6 oz 206 lb  Weight (kg) 100.517 kg 99.837 kg 93.441 kg      Telemetry  Overnight telemetry shows A. fib with RVR up into the 180 bpm range, now a paced, which I personally reviewed.   ECG  The most recent ECG shows sinus tachycardia left bundle branch block, paroxysmal A. fib noted on the EKG, which I personally reviewed.   Physical Exam   Vitals:   12/16/20 2340 12/17/20 0337 12/17/20 0728 12/17/20 1101  BP: 107/67 (!) 99/56 (!) 105/52 100/65  Pulse: 100 81 67 73  Resp: 14 20  19 18   Temp: 97.6 F (36.4 C) 97.6 F (36.4 C) 98.2 F (36.8 C) 98.2 F (36.8 C)  TempSrc: Oral Oral Oral Oral  SpO2: 94% 98% 92% 95%  Weight:  100.5 kg    Height:         Intake/Output Summary (Last 24 hours) at 12/17/2020 1123 Last data filed at 12/17/2020 0200 Gross per 24 hour  Intake 2110.14 ml  Output 375 ml  Net 1735.14 ml    Last 3 Weights 12/17/2020 12/16/2020 12/11/2020  Weight (lbs) 221 lb 9.6 oz 220 lb 1.6 oz 206 lb  Weight (kg) 100.517 kg 99.837 kg 93.441 kg    Body mass index is 27.7 kg/m.   General: Well nourished, well developed, in no acute distress Head: Atraumatic, normal size  Eyes: PEERLA, EOMI  Neck: Supple, no JVD Endocrine: No thryomegaly Cardiac: Normal S1, S2; RRR; no murmurs, rubs, or gallops Lungs: Clear to auscultation bilaterally, no wheezing, rhonchi or rales  Abd: Distended abdomen, nontender to palpation Ext: No edema, pulses 2+ Musculoskeletal: No deformities, BUE and BLE strength normal and equal Skin: Warm and dry, no rashes   Neuro: Alert and oriented to person, place, time, and situation, CNII-XII grossly intact, no focal deficits  Psych: Normal mood and affect   Labs  High Sensitivity Troponin:  No results for  input(s): TROPONINIHS in the last 720 hours.   Cardiac EnzymesNo results for input(s): TROPONINI in the last 168 hours. No results for input(s): TROPIPOC in the last 168 hours.  Chemistry Recent Labs  Lab 12/11/20 1828 12/15/20 1614 12/16/20 0200 12/16/20 1007 12/17/20 0732  NA 138 136 136 135 138  K 4.3 4.9 3.4* 4.3 4.0  CL 104 105 108 106 106  CO2 26 20* 21* 19* 21*  GLUCOSE 115* 133* 108* 102* 102*  BUN 22 46* 44* 52* 46*  CREATININE 1.27* 2.13* 1.70* 1.75* 1.65*  CALCIUM 9.6 10.3 8.3* 9.5 9.5  PROT 6.4* 6.5 5.0*  --   --   ALBUMIN 3.8 3.1* 2.3*  --   --   AST 26 44* 30  --   --   ALT 24 35 26  --   --   ALKPHOS 40 83 54  --   --   BILITOT 1.6* 1.5* 1.1  --   --   GFRNONAA 57* 31* 40* 39* 41*  ANIONGAP 8 11 7 10 11      Hematology Recent Labs  Lab 12/15/20 1614 12/16/20 0200 12/17/20 0732  WBC 15.8* 11.4* 10.4  RBC 4.47 3.76* 4.12*  HGB 14.5 12.4* 13.5  HCT 44.2 37.5* 39.6  MCV 98.9 99.7 96.1  MCH 32.4 33.0 32.8  MCHC 32.8 33.1 34.1  RDW 14.6 14.6 14.6  PLT 269 214 250   BNPNo results for input(s): BNP, PROBNP in the last 168 hours.  DDimer No results for input(s): DDIMER in the last 168 hours.   Radiology  CT ABDOMEN PELVIS WO CONTRAST  Result Date: 12/15/2020 CLINICAL DATA:  Acute abdominal pain. Diagnosed with diverticulitis on Saturday and lower abdominal pain since then. EXAM: CT ABDOMEN AND PELVIS WITHOUT CONTRAST TECHNIQUE: Multidetector CT imaging of the abdomen and pelvis was performed following the standard protocol without IV contrast. COMPARISON:  12/11/2020 FINDINGS: Lower chest: Mild dependent changes in the lung bases. Cardiac enlargement. Hepatobiliary: Probable hepatic cirrhosis with atrophy of the medial segment left lobe and nodular contour to the liver. Gallbladder and bile ducts are unremarkable. Pancreas: Diffuse fatty replacement of the pancreas. No inflammatory changes or ductal dilatation. Spleen: Normal in size without focal abnormality. Adrenals/Urinary Tract: Adrenal glands are unremarkable. Kidneys are normal, without renal calculi, focal lesion, or hydronephrosis. Bladder is unremarkable. Stomach/Bowel: Stomach is not abnormally distended. Significant progression of previous inflammatory process in the pelvis. This likely represents progression of the diverticulitis. There is extensive inflammatory stranding now seen throughout the pelvic fat with dilated fluid-filled small bowel, possibly due to small-bowel obstruction or reactive change. There is new free intra-abdominal air consistent with perforation. No loculated abscess collection is identified. Vascular/Lymphatic: Aortic atherosclerosis. No enlarged abdominal or pelvic lymph nodes. Reproductive: Prostate gland is not  enlarged. Other: Small amount of free fluid in the abdomen. Abdominal wall musculature appears intact. Musculoskeletal: Bilateral hip arthroplasties. Degenerative changes in the spine. IMPRESSION: 1. Significant progression of previous inflammatory process in the pelvis likely representing progression of the diverticulitis. New free intra-abdominal air consistent with perforation. Inflammatory stranding and edema throughout the pelvic fat. Developing small bowel dilatation with fluid-filled loops, probably reactive. No loculated abscess collection is identified. 2. Probable hepatic cirrhosis. 3. Small amount of free fluid in the abdomen. 4. Aortic atherosclerosis. Aortic Atherosclerosis (ICD10-I70.0). Critical Value/emergent results were called by telephone at the time of interpretation on 12/15/2020 at 8:39 pm to provider [facility] , who verbally acknowledged these results. Electronically Signed   By: Gwyndolyn Saxon  Gerilyn Nestle M.D.   On: 12/15/2020 20:44   DG Chest Portable 1 View  Result Date: 12/15/2020 CLINICAL DATA:  Shortness of breath.  Diverticulitis. EXAM: PORTABLE CHEST 1 VIEW COMPARISON:  04/19/2016 FINDINGS: Mild cardiac enlargement. Shallow inspiration with linear atelectasis in the lung bases. No consolidation or airspace disease. No pleural effusions. No pneumothorax. Cardiac pacemaker. IMPRESSION: Shallow inspiration with linear atelectasis in the lung bases. Electronically Signed   By: Lucienne Capers M.D.   On: 12/15/2020 23:22    Cardiac Studies  TTE 11/24/2020 1. Left ventricular ejection fraction, by estimation, is 40 to 45%. The  left ventricle has mildly decreased function. The left ventricle  demonstrates regional wall motion abnormalities. Abnormal (paradoxical)  septal motion, consistent with left bundle  branch block. The left ventricular internal cavity size was mildly  dilated. Left ventricular diastolic parameters are consistent with Grade  II diastolic dysfunction  (pseudonormalization). Elevated left atrial  pressure.  2. Right ventricular systolic function is normal. The right ventricular  size is moderately enlarged. There is normal pulmonary artery systolic  pressure. The estimated right ventricular systolic pressure is 53.6 mmHg.  3. Left atrial size was severely dilated.  4. Right atrial size was mildly dilated.  5. The mitral valve is normal in structure. Mild mitral valve  regurgitation.  6. The aortic valve is tricuspid. Aortic valve regurgitation is not  visualized. Mild to moderate aortic valve sclerosis/calcification is  present, without any evidence of aortic stenosis.  7. The inferior vena cava is normal in size with greater than 50%  respiratory variability, suggesting right atrial pressure of 3 mmHg.   Patient Profile  [name] is a 80 y.o. 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  Cardiology was consulted for preoperative assessment as well as A. fib with RVR.  Assessment & Plan   1.  Preoperative assessment -Recent nuclear medicine stress test normal.  Nonobstructive CAD on heart cath. -Appears his perforated diverticulitis will be treated medically. -If he needs surgery he is stable and can go from my standpoint. -I have discontinued his fluids.  Would encourage adequate p.o. intake given EF of 40 to 45%.  2.  Systolic heart failure, EF 40-45% -Does not appear to be decompensated.  Hold ACE in setting of AKI.  Restart as you are able. -Continue Coreg. -Intravenous fluids have been stopped.  He is starting on a p.o. diet.  He is +1.7 L.  Would not give further IV fluids. -Suspect kidney function will continue to improve.  3.  Paroxysmal atrial fibrillation -Having paroxysms of atrial  fibrillation. -EP is recommended to continue Tikosyn.  We will continue per their recommendation. -On heparin drip. -Transition to [place] once surgical procedures will not be planned.  For questions or updates, please contact [place] Please consult [email or web address] for contact info under   Time Spent with Patient: I have spent a total of 25 minutes with patient reviewing hospital notes, telemetry, EKGs, labs and examining the patient as well as establishing an assessment and plan that was discussed with the patient.  > 50% of time was spent in direct patient care.    Signed, Addison Naegeli. Audie Box, MD, [place]  12/17/2020 11:23 AM

## 2020-12-17 NOTE — Progress Notes (Signed)
   12/17/20 2257  Vitals  Temp (!) 97.2 F (36.2 C)  Temp Source Oral  BP 100/64  MAP (mmHg) 75  BP Location Left Arm  BP Method Automatic  Patient Position (if appropriate) Lying  Pulse Rate (!) 140  Pulse Rate Source Monitor  ECG Heart Rate (!) 142  Resp 16  Level of Consciousness  Level of Consciousness Alert  MEWS COLOR  MEWS Score Color Red  Oxygen Therapy  SpO2 95 %  O2 Device Room Air (added 02 at 2 liters at this time. sometimes sats drop to 85%)  Pain Assessment  Pain Scale 0-10  Pain Score 8  Pain Type Chronic pain  Pain Location Abdomen  Pain Orientation Right;Lower  Pain Descriptors / Indicators Constant;Pressure  Pain Frequency Constant  Pain Onset Gradual  Pain Intervention(s) Medication (See eMAR);Repositioned  MEWS Score  MEWS Temp 0  MEWS Systolic 1  MEWS Pulse 3  MEWS RR 0  MEWS LOC 0  MEWS Score 4  H.R. remains up. Will give Lopressor 2.5 mg I.V. per PRN orders. Also ask patient if having any pain. He stated yes 8/10 rt lower abdo. Gave Dilaudid 0.5 mg I.V. for pain. Applied 02 at 2 l/m N.C. sometimes 02 sats drop to 85% with good wave form. Butch Penny R.N. aware.

## 2020-12-17 NOTE — Progress Notes (Addendum)
  Afternoon EKG reviewed  Shows pt in NSR at 81 bpm with stable QTc at approximately 480 ms when measured manually and corrected for QRS/LBBB.   I reviewed personally with Dr. Caryl Comes. Auto calculate was incorrect. Please give dose as ordered at 2300 this evening.   He has a stable QTc on chronic dose tikosyn, and does not need further Tikosyn monitoring EKGs unless he has acute changes.  We will continue to monitor his renal function. Current dosing is appropriate at his baseline renal function, and it is currently trending in that direction.   Pts next doses should be as follows: 4/8 (today) at 2300 4/9 at 1000 and 2100 4/10 at 0800 and 2000 (His normal schedule)  Continue Tikosyn 500 mcg BID.   Shirley Friar, PA-C  Pager: [phone]  12/17/2020 3:44 PM

## 2020-12-17 NOTE — Progress Notes (Signed)
   12/17/20 2157  Vitals  Temp 98.5 F (36.9 C)  Temp Source Axillary  BP 91/65  MAP (mmHg) 75  BP Location Left Arm  BP Method Automatic  Patient Position (if appropriate) Lying  Pulse Rate (!) 140  Pulse Rate Source Monitor  ECG Heart Rate (!) 140  Resp 20  Level of Consciousness  Level of Consciousness Alert  MEWS COLOR  MEWS Score Color Red  Oxygen Therapy  SpO2 93 %  O2 Device Room Air  Patient Activity (if Appropriate) In bed  Pain Assessment  Pain Scale 0-10  Pain Score 0  MEWS Score  MEWS Temp 0  MEWS Systolic 1  MEWS Pulse 3  MEWS RR 0  MEWS LOC 0  MEWS Score 4  H.R.  remains up At. Fi.b RVR Will give Tikosyn 1 hour early. Discuss with Jeral Fruit. charge nurse.

## 2020-12-17 NOTE — Progress Notes (Signed)
Subjective No acute events. Feeling much better. States his abd pain is about 1/3 of what it was 30 hrs ago. No n/v. Bloating subjectively much better.  Objective: Vital signs in last 24 hours: Temp:  [97.4 F (36.3 C)-98.2 F (36.8 C)] 98.2 F (36.8 C) (04/08 0728) Pulse Rate:  [67-100] 67 (04/08 0728) Resp:  [14-20] 19 (04/08 0728) BP: (96-116)/(52-67) 105/52 (04/08 0728) SpO2:  [92 %-99 %] 92 % (04/08 0728) Weight:  [100.5 kg] 100.5 kg (04/08 0337) Last BM Date: 12/17/20  Intake/Output from previous day: 04/07 0701 - 04/08 0700 In: 2110.1 [I.V.:1716.3; IV Piggyback:393.9] Out: 375 [Urine:375] Intake/Output this shift: No intake/output data recorded.  Gen: NAD, comfortable; up in chair CV: RRR Pulm: Normal work of breathing Abd: Soft, minimal tenderness, mildly distended Ext: SCDs in place  Lab Results: CBC  Recent Labs    12/16/20 0200 12/17/20 0732  WBC 11.4* 10.4  HGB 12.4* 13.5  HCT 37.5* 39.6  PLT 214 250   BMET Recent Labs    12/16/20 1007 12/17/20 0732  NA 135 138  K 4.3 4.0  CL 106 106  CO2 19* 21*  GLUCOSE 102* 102*  BUN 52* 46*  CREATININE 1.75* 1.65*  CALCIUM 9.5 9.5   PT/INR No results for input(s): LABPROT, INR in the last 72 hours. ABG No results for input(s): PHART, HCO3 in the last 72 hours.  Invalid input(s): PCO2, PO2  Studies/Results:  Anti-infectives: Anti-infectives (From admission, onward)   Start     Dose/Rate Route Frequency Ordered Stop   12/16/20 0600  piperacillin-tazobactam (ZOSYN) IVPB 3.375 g        3.375 g 12.5 mL/hr over 240 Minutes Intravenous Every 8 hours 12/16/20 0133     12/15/20 2230  piperacillin-tazobactam (ZOSYN) IVPB 3.375 g        3.375 g 100 mL/hr over 30 Minutes Intravenous  Once 12/15/20 2217 12/16/20 0015       Assessment/Plan: Patient Active Problem List   Diagnosis Date Noted  . Diverticulitis of colon with perforation 12/15/2020  . Acute renal failure superimposed on stage 3a chronic  kidney disease ([place]) 12/15/2020  . Severe sepsis ([place]) 12/15/2020  . Eustachian tube dysfunction 06/27/2020  . CKD (chronic kidney disease) stage 3, GFR 30-59 ml/min (HCC) 06/27/2020  . Vertigo 06/27/2020  . Olecranon bursitis, right elbow 06/27/2020  . Sinus node dysfunction ([place]) 09/10/2019  . LBBB (left bundle branch block) 09/10/2019  . Hyperglycemia 04/19/2016  . Chronic combined systolic and diastolic CHF (congestive heart failure) ([place]) 05/05/2015  . Insomnia 04/07/2015  . Aftercare following surgery of the circulatory system, [place] 04/24/2014  . Degenerative joint disease (DJD) of hip 12/09/2013  . 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 Osteoarthritis of left hip 11/05/2013  . Chronic left sacroiliac joint pain 10/13/2013  . Discoloration of skin of lower leg-Left 04/11/2013  . Allergic rhinitis 06/22/2012  . Encounter for well adult exam with abnormal findings 06/21/2012  . Chronic sinusitis 06/21/2012  . 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 Erectile dysfunction 02/21/2011  . 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 CERVICAL RADICULOPATHY, LEFT 05/31/2010  . Atrial fibrillation ([place]) 05/11/2009  . 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 LATERAL EPICONDYLITIS, RIGHT 02/02/2009  . HYPERLIPIDEMIA 01/07/2008  . DEPRESSION 01/07/2008  . OBSTRUCTIVE SLEEP APNEA 01/07/2008  . Peripheral vascular disease ([place]) 01/07/2008  . DIVERTICULOSIS, COLON 01/07/2008   -He is doing well and his diverticulitis appears to be resolving as did his somewhat reactive ileus -WBC normal -Afebrile -Continue IV abx today -  Ok for liquids as tolerated today -On heparin gtt   LOS: 2 days   Nadeen Landau, MD [facility] Use [email or web address] to contact on call provider

## 2020-12-17 NOTE — Progress Notes (Signed)
Mobility Specialist - Progress Note   12/17/20 1316  Mobility  Activity Ambulated in hall  Level of Assistance Standby assist, set-up cues, supervision of patient - no hands on  Assistive Device  (IV Stand)  Distance Ambulated (ft) 450 ft  Mobility Response Tolerated well  Mobility performed by Mobility specialist  $Mobility charge 1 Mobility   Pt required one standing rest break due to R hip cramping, otherwise asx. HR remained in 60s throughout. Pt to recliner after walk.   Pricilla Handler Mobility Specialist Mobility Specialist Phone: [phone]

## 2020-12-17 NOTE — Progress Notes (Signed)
[facility] Progress Note  Patient: [name]    VQM:[id number]  DOA: 12/15/2020     Date of Service: the patient was seen and examined on 12/17/2020  Brief hospital course: Past medical history of A. fib on Eliquis, chronic combined CHF, CAD, OSA, CKD 3.  Presents with complaints of abdominal pain found to have diverticulitis with microperforation. General surgery cardiology as well as EP consulted. Currently plan is conservative measures for diverticulitis.  Assessment and Plan: 1. Diverticulitis with perforation  - Diagnosed with sigmoid diverticulitis on 4/2 and now presents with worsening LLQ pain despite Augmentin and tramadol and is found to have progressive inflammatory changes and new free air  - Surgery consulting and much appreciated  - No peritoneal signs on admission, lactate normal  - Continue Zosyn, bowel-rest, hold Eliquis, pain-control   2.   Chronic a flutter and atrial fibrillation  Prolonged QTC LBBB - In rate-controlled atrial fibrillation in ED  - Last dose of Eliquis was pm of 12/14/20  - CHADS-VASc at least 5 (age x2, CAD, CHF, HTN)  - Hold Eliquis, use IV heparin for now for CVA prevention  QTC 510 although corrected in the setting of LBBB for 70. EP consulted appreciate assistance. Tikosyn dose currently appropriate but if the renal function remains lower he will require further adjustment.  Maintain K more than 4 and mag more than 240%.  3. Chronic combined systolic and diastolic dysfunction  - Appears compensated  - Echo from March 2022 with EF 93-26%, grade 2 diastolic dysfunction, severe LAE - Continue IVF hydration in setting of AKI, monitor weight and I/Os, continue Coreg as tolerated    4. CAD - He has been exercising regularly without angina  - Continue beta-blocker as tolerated   5. Acute kidney injury superimposed on CKD IIIa  - SCr is 2.13 on admission, up from 1.27 on 12/11/20 - No hydronephrosis on CT  - Likely acute prerenal  azotemia in setting of anorexia  - Treated with IVF hydration, hold losartan, renally-dose medications   Diet: Clear liquid diet DVT Prophylaxis: Heparin therapeutic     Advance goals of care discussion: Full code  Family Communication: no family was present at bedside, at the time of interview.   Disposition:  Status is: Inpatient  Remains inpatient appropriate because:Needing for IV biotics and minimal oral intake  Dispo: The patient is from: Home              Anticipated d/c is to: Home              Patient currently is not medically stable to d/c.   Difficult to place patient   Subjective: No nausea no vomiting.  Abdominal pain improving but still present.  Not passing gas.  No blood anywhere.  Mild short of breath.  Physical Exam:  General: Appear in mild distress, no Rash; Oral Mucosa Clear, moist. no Abnormal Neck Mass Or lumps, Conjunctiva normal  Cardiovascular: S1 and S2 Present, no Murmur, Respiratory: good respiratory effort, Bilateral Air entry present and CTA, no Crackles, no wheezes Abdomen: Bowel Sound present, Soft and mild diffuse tenderness Extremities: Trace pedal edema Neurology: alert and oriented to time, place, and person affect appropriate. no new focal deficit Gait not checked due to patient safety concerns    Vitals:   12/17/20 0337 12/17/20 0728 12/17/20 1101 12/17/20 1633  BP: (!) 99/56 (!) 105/52 100/65 97/63  Pulse: 81 67 73 74  Resp: 20 19 18 19   Temp: 97.6  F (36.4 C) 98.2 F (36.8 C) 98.2 F (36.8 C) (!) 97.5 F (36.4 C)  TempSrc: Oral Oral Oral Oral  SpO2: 98% 92% 95% 93%  Weight: 100.5 kg     Height:        Intake/Output Summary (Last 24 hours) at 12/17/2020 2004 Last data filed at 12/17/2020 0200 Gross per 24 hour  Intake 925 ml  Output 375 ml  Net 550 ml   Filed Weights   12/16/20 0640 12/17/20 0337  Weight: 99.8 kg 100.5 kg    Data Reviewed: I have personally reviewed and interpreted daily labs, tele strips,  imaging. I reviewed all nursing notes, pharmacy notes, vitals, pertinent old records I have discussed plan of care as described above with RN and patient/family.  CBC: Recent Labs  Lab 12/11/20 1828 12/15/20 1614 12/16/20 0200 12/17/20 0732  WBC 11.5* 15.8* 11.4* 10.4  NEUTROABS 9.6*  --   --   --   HGB 13.2 14.5 12.4* 13.5  HCT 39.4 44.2 37.5* 39.6  MCV 98.7 98.9 99.7 96.1  PLT 111* 269 214 532   Basic Metabolic Panel: Recent Labs  Lab 12/11/20 1828 12/15/20 1614 12/16/20 0200 12/16/20 1007 12/17/20 0732  NA 138 136 136 135 138  K 4.3 4.9 3.4* 4.3 4.0  CL 104 105 108 106 106  CO2 26 20* 21* 19* 21*  GLUCOSE 115* 133* 108* 102* 102*  BUN 22 46* 44* 52* 46*  CREATININE 1.27* 2.13* 1.70* 1.75* 1.65*  CALCIUM 9.6 10.3 8.3* 9.5 9.5  MG  --   --   --  1.9 2.1    Studies: No results found.  Scheduled Meds: . carvedilol  50 mg Oral BID WC  . dofetilide  500 mcg Oral BID  . pantoprazole (PROTONIX) IV  40 mg Intravenous Daily   Continuous Infusions: . heparin 1,900 Units/hr (12/17/20 1003)  . piperacillin-tazobactam (ZOSYN)  IV 3.375 g (12/17/20 1359)   PRN Meds: acetaminophen **OR** acetaminophen, HYDROmorphone (DILAUDID) injection, metoprolol tartrate, ondansetron **OR** ondansetron (ZOFRAN) IV  Time spent: 35 minutes  Author: Berle Mull, MD [facility] 12/17/2020 8:04 PM  To reach On-call, see care teams to locate the attending and reach out via [email or web address]. Between 7PM-7AM, please contact night-coverage If you still have difficulty reaching the attending provider, please page the [facility] (Director on Call) for [facility] on amion for assistance.

## 2020-12-17 NOTE — Progress Notes (Signed)
Patient refused CPAP tonight 

## 2020-12-17 NOTE — Progress Notes (Signed)
[place] for Heparin (Apixaban on hold) Indication: atrial fibrillation  Allergies  Allergen Reactions  . Amiodarone Other (See Comments)    Peripheral neuropathy resulted  . Niacin Other (See Comments)    Caused an irregular heartbeat  . Ace Inhibitors Cough  . Mometasone Furoate Other (See Comments)    (Nasonex) Causes nasal bleeding and nose bleeds    Vital Signs: Temp: 98.2 F (36.8 C) (04/08 0728) Temp Source: Oral (04/08 0728) BP: 105/52 (04/08 0728) Pulse Rate: 67 (04/08 0728)  Labs: Recent Labs    12/15/20 1614 12/15/20 2225 12/16/20 0200 12/16/20 1007 12/16/20 2207 12/17/20 0732  HGB 14.5  --  12.4*  --   --  13.5  HCT 44.2  --  37.5*  --   --  39.6  PLT 269  --  214  --   --  250  APTT  --   --   --  43* 59*  --   HEPARINUNFRC  --   --   --  1.98*  --  1.48*  CREATININE 2.13*  --  1.70* 1.75*  --  1.65*  CKTOTAL  --  97  --   --   --   --     Estimated Creatinine Clearance: 42 mL/min (A) (by C-G formula based on SCr of 1.65 mg/dL (H)).   Medical History: Past Medical History:  Diagnosis Date  . Allergic rhinitis    takes Allegra daily  . Allergic rhinitis, cause unspecified 06/22/2012  . Arthritis   . Atrial fibrillation ([place])    takes Tikosyn and Eliquis daily  . 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 Coronary artery disease    LAD stenting 2004 non-DES  . Depression    took Zoloft 42yrs ago but nothing now  . Diverticulosis   . DVT (deep venous thrombosis) ([place])   . Dysphagia    occasionally  . History of blood clots 2003   left leg prior to fem pop  . History of colon polyps   . Hyperlipidemia    takes Tricor and Lipitor daily  . Insomnia    takes Ambien nightly as needed  . Joint pain   . Joint swelling   . 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 OSA (obstructive sleep apnea)    CPAP  . Pacemaker   . Peripheral vascular disease ([place])   . Pleurisy    early 81's  .  Prostatitis   . Urinary urgency     Assessment: 80 y/o M with abdominal pain found to have diverticulitis/perforation. On apixaban PTA for afib. Holding apixaban and starting heparin in case surgery is needed. Last dose on apixaban was 4/5 PM>>ok to start heparin now. Anticipate using aPTT to dose. CBC good. Noted renal dysfunction.   Heparin level elevated at 1.4 as expected d/t recent apixaban use, aptt still below desired goal at 61s. No bleeding issues noted.   Goal of Therapy:  Heparin level 0.3-0.7 units/ml aPTT 66-102 seconds Monitor platelets by anticoagulation protocol: Yes   Plan:  Increase heparin drip to 1900 units/hr 1800 aPTT Daily CBC/aPTT/heparin level Monitor for bleeding  Erin Hearing PharmD., BCPS Clinical Pharmacist 12/17/2020 9:05 AM

## 2020-12-17 NOTE — Progress Notes (Signed)
[place] for Heparin  Indication: atrial fibrillation  Allergies  Allergen Reactions  . Amiodarone Other (See Comments)    Peripheral neuropathy resulted  . Niacin Other (See Comments)    Caused an irregular heartbeat  . Ace Inhibitors Cough  . Mometasone Furoate Other (See Comments)    (Nasonex) Causes nasal bleeding and nose bleeds    Vital Signs: Temp: 97.6 F (36.4 C) (04/07 2340) Temp Source: Oral (04/07 2340) BP: 107/67 (04/07 2340) Pulse Rate: 100 (04/07 2340)  Labs: Recent Labs    12/15/20 1614 12/15/20 2225 12/16/20 0200 12/16/20 1007 12/16/20 2207  HGB 14.5  --  12.4*  --   --   HCT 44.2  --  37.5*  --   --   PLT 269  --  214  --   --   APTT  --   --   --  43* 59*  HEPARINUNFRC  --   --   --  1.98*  --   CREATININE 2.13*  --  1.70* 1.75*  --   CKTOTAL  --  97  --   --   --     Estimated Creatinine Clearance: 39.6 mL/min (A) (by C-G formula based on SCr of 1.75 mg/dL (H)).   Assessment: 80 y.o. male with h/o Afib, Eliquis on hold, for heparin  Goal of Therapy:  Heparin level 0.3-0.7 units/ml aPTT 66-102 seconds Monitor platelets by anticoagulation protocol: Yes   Plan:  Increase Heparin 1700 units/hr  Phillis Knack, PharmD, BCPS  12/17/2020 1:14 AM

## 2020-12-17 NOTE — Progress Notes (Signed)
[place] for Heparin (Apixaban on hold) Indication: atrial fibrillation  Allergies  Allergen Reactions  . Amiodarone Other (See Comments)    Peripheral neuropathy resulted  . Niacin Other (See Comments)    Caused an irregular heartbeat  . Ace Inhibitors Cough  . Mometasone Furoate Other (See Comments)    (Nasonex) Causes nasal bleeding and nose bleeds    Vital Signs: Temp: 98.5 F (36.9 C) (04/08 2157) Temp Source: Axillary (04/08 2157) BP: 91/65 (04/08 2157) Pulse Rate: 140 (04/08 2157)  Labs: Recent Labs    12/15/20 1614 12/15/20 2225 12/16/20 0200 12/16/20 1007 12/16/20 2207 12/17/20 0732 12/17/20 0859 12/17/20 1849 12/17/20 2116  HGB 14.5  --  12.4*  --   --  13.5  --   --   --   HCT 44.2  --  37.5*  --   --  39.6  --   --   --   PLT 269  --  214  --   --  250  --   --   --   APTT  --   --   --  43*   < >  --  61* 117* 123*  HEPARINUNFRC  --   --   --  1.98*  --  1.48*  --   --   --   CREATININE 2.13*  --  1.70* 1.75*  --  1.65*  --   --   --   CKTOTAL  --  97  --   --   --   --   --   --   --    < > = values in this interval not displayed.    Estimated Creatinine Clearance: 42 mL/min (A) (by C-G formula based on SCr of 1.65 mg/dL (H)).   Medical History: Past Medical History:  Diagnosis Date  . Allergic rhinitis    takes Allegra daily  . Allergic rhinitis, cause unspecified 06/22/2012  . Arthritis   . Atrial fibrillation ([place])    takes Tikosyn and Eliquis daily  . 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 Coronary artery disease    LAD stenting 2004 non-DES  . Depression    took Zoloft 67yrs ago but nothing now  . Diverticulosis   . DVT (deep venous thrombosis) ([place])   . Dysphagia    occasionally  . History of blood clots 2003   left leg prior to fem pop  . History of colon polyps   . Hyperlipidemia    takes Tricor and Lipitor daily  . Insomnia    takes Ambien nightly as needed  . Joint pain   .  Joint swelling   . 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 OSA (obstructive sleep apnea)    CPAP  . Pacemaker   . Peripheral vascular disease ([place])   . Pleurisy    early 71's  . Prostatitis   . Urinary urgency     Assessment: 79 y/o M with abdominal pain found to have diverticulitis/perforation. On apixaban PTA for afib. Holding apixaban and starting heparin in case surgery is needed. Last dose on apixaban was 4/5 PM>>ok to start heparin now. Anticipate using aPTT to dose. CBC good. Noted renal dysfunction.   APTT 117 after rate increase to 1900 units/hr. Repeat APTT 123 is supratherapeutic as well. Confirmed drawn appropriately. No reported bleeding per RN.   Goal of Therapy:  Heparin level 0.3-0.7 units/ml aPTT  66-102 seconds Monitor platelets by anticoagulation protocol: Yes   Plan:  Decrease heparin to 1800 units/hr Check 8 hr aPTT Daily CBC/aPTT/heparin level Monitor for bleeding  Cristela Felt, PharmD Clinical Pharmacist  12/17/2020 10:03 PM

## 2020-12-18 ENCOUNTER — Inpatient Hospital Stay (HOSPITAL_COMMUNITY): Payer: Medicare Other | Admitting: Anesthesiology

## 2020-12-18 ENCOUNTER — Encounter (HOSPITAL_COMMUNITY)
Admission: EM | Disposition: A | Discharge status: Home Health | Payer: Self-pay | Source: Home / Self Care | Attending: Internal Medicine

## 2020-12-18 DIAGNOSIS — I480 Paroxysmal atrial fibrillation: Secondary | ICD-10-CM | POA: Diagnosis not present

## 2020-12-18 HISTORY — PX: CARDIOVERSION: SHX1299

## 2020-12-18 LAB — BASIC METABOLIC PANEL
Anion gap: 6 (ref 5–15)
BUN: 36 mg/dL — ABNORMAL HIGH (ref 8–23)
CO2: 23 mmol/L (ref 22–32)
Calcium: 8.8 mg/dL — ABNORMAL LOW (ref 8.9–10.3)
Chloride: 110 mmol/L (ref 98–111)
Creatinine, Ser: 1.43 mg/dL — ABNORMAL HIGH (ref 0.61–1.24)
GFR, Estimated: 49 mL/min — ABNORMAL LOW (ref >60)
Glucose, Bld: 115 mg/dL — ABNORMAL HIGH (ref 70–99)
Potassium: 3.5 mmol/L (ref 3.5–5.1)
Sodium: 139 mmol/L (ref 135–145)

## 2020-12-18 LAB — MAGNESIUM: Magnesium: 2 mg/dL (ref 1.7–2.4)

## 2020-12-18 LAB — HEPARIN LEVEL (UNFRACTIONATED): Heparin Unfractionated: 0.96 IU/mL — ABNORMAL HIGH (ref 0.30–0.70)

## 2020-12-18 LAB — CBC
HCT: 37.4 % — ABNORMAL LOW (ref 39.0–52.0)
Hemoglobin: 12.7 g/dL — ABNORMAL LOW (ref 13.0–17.0)
MCH: 32.6 pg (ref 26.0–34.0)
MCHC: 34 g/dL (ref 30.0–36.0)
MCV: 96.1 fL (ref 80.0–100.0)
Platelets: 262 10*3/uL (ref 150–400)
RBC: 3.89 MIL/uL — ABNORMAL LOW (ref 4.22–5.81)
RDW: 14.9 % (ref 11.5–15.5)
WBC: 10.9 10*3/uL — ABNORMAL HIGH (ref 4.0–10.5)
nRBC: 0 % (ref 0.0–0.2)

## 2020-12-18 LAB — APTT: aPTT: 102 seconds — ABNORMAL HIGH (ref 24–36)

## 2020-12-18 LAB — MRSA PCR SCREENING: MRSA by PCR: NEGATIVE

## 2020-12-18 SURGERY — CARDIOVERSION
Anesthesia: General

## 2020-12-18 MED ORDER — NOREPINEPHRINE 4 MG/250ML-% IV SOLN
2.0000 ug/min | INTRAVENOUS | Status: DC
  Administered 2020-12-18: 2 ug/min via INTRAVENOUS

## 2020-12-18 MED ORDER — LIDOCAINE HCL (CARDIAC) PF 100 MG/5ML IV SOSY
PREFILLED_SYRINGE | INTRAVENOUS | Status: DC | PRN
  Administered 2020-12-18: 80 mg via INTRATRACHEAL

## 2020-12-18 MED ORDER — ENSURE ENLIVE PO LIQD
237.0000 mL | Freq: Three times a day (TID) | ORAL | Status: DC
  Administered 2020-12-18 – 2020-12-22 (×4): 237 mL via ORAL

## 2020-12-18 MED ORDER — SODIUM CHLORIDE 0.9 % IV SOLN: INTRAVENOUS | Status: DC | PRN

## 2020-12-18 MED ORDER — MAGNESIUM SULFATE 2 GM/50ML IV SOLN
2.0000 g | Freq: Once | INTRAVENOUS | Status: AC
  Administered 2020-12-18: 2 g via INTRAVENOUS
  Filled 2020-12-18: qty 50

## 2020-12-18 MED ORDER — POTASSIUM CHLORIDE CRYS ER 20 MEQ PO TBCR
40.0000 meq | EXTENDED_RELEASE_TABLET | Freq: Two times a day (BID) | ORAL | Status: DC
  Administered 2020-12-18 (×2): 40 meq via ORAL
  Filled 2020-12-18 (×3): qty 2

## 2020-12-18 MED ORDER — DOFETILIDE 500 MCG PO CAPS
500.0000 ug | ORAL_CAPSULE | Freq: Two times a day (BID) | ORAL | Status: DC
  Administered 2020-12-18 – 2020-12-20 (×4): 500 ug via ORAL
  Filled 2020-12-18 (×4): qty 1

## 2020-12-18 MED ORDER — PHENYLEPHRINE 40 MCG/ML (10ML) SYRINGE FOR IV PUSH (FOR BLOOD PRESSURE SUPPORT)
PREFILLED_SYRINGE | INTRAVENOUS | Status: DC | PRN
  Administered 2020-12-18: 120 ug via INTRAVENOUS

## 2020-12-18 MED ORDER — NOREPINEPHRINE 4 MG/250ML-% IV SOLN
INTRAVENOUS | Status: AC
  Filled 2020-12-18: qty 250

## 2020-12-18 MED ORDER — CHLORHEXIDINE GLUCONATE CLOTH 2 % EX PADS
6.0000 | MEDICATED_PAD | Freq: Every day | CUTANEOUS | Status: DC
  Administered 2020-12-18 – 2020-12-25 (×5): 6 via TOPICAL

## 2020-12-18 MED ORDER — PROPOFOL 10 MG/ML IV BOLUS
INTRAVENOUS | Status: DC | PRN
  Administered 2020-12-18: 40 mg via INTRAVENOUS

## 2020-12-18 NOTE — Progress Notes (Signed)
Dr. Blossom Hoops return call . See orders for transfer to ICU [facility]. with Rapid Response also aware.

## 2020-12-18 NOTE — Progress Notes (Signed)
Subjective Transferred to ICU overnight due to a.fib with RVR and hypotension. Was started on levophed.  States his abd pain is improving. Tolerated a liquid diet yesterday. BMx2 last 24h, nonbloody.   Objective: Vital signs in last 24 hours: Temp:  [97.2 F (36.2 C)-98.5 F (36.9 C)] 97.2 F (36.2 C) (04/09 0302) Pulse Rate:  [69-145] 145 (04/09 0730) Resp:  [16-35] 26 (04/09 0730) BP: (81-121)/(47-97) 99/79 (04/09 0730) SpO2:  [93 %-100 %] 97 % (04/09 0730) Weight:  [100.1 kg] 100.1 kg (04/09 0448) Last BM Date: 12/17/20  Intake/Output from previous day: 04/08 0701 - 04/09 0700 In: 1077.3 [P.O.:120; I.V.:711.5; IV Piggyback:245.8] Out: 475 [Urine:475] Intake/Output this shift: No intake/output data recorded.  Gen: NAD, comfortable; up in chair CV: irregularly irregular, HR 140's during my exam, a.flutter on monitor Pulm: Normal work of breathing ORA, CTAB Abd: Soft, suprapubic tenderness present, mild to moderate distention, +BS  Ext: SCDs in place  Lab Results: CBC  Recent Labs    12/17/20 0732 12/18/20 0455  WBC 10.4 10.9*  HGB 13.5 12.7*  HCT 39.6 37.4*  PLT 250 262   BMET Recent Labs    12/17/20 0732 12/18/20 0455  NA 138 139  K 4.0 3.5  CL 106 110  CO2 21* 23  GLUCOSE 102* 115*  BUN 46* 36*  CREATININE 1.65* 1.43*  CALCIUM 9.5 8.8*   PT/INR No results for input(s): LABPROT, INR in the last 72 hours. ABG No results for input(s): PHART, HCO3 in the last 72 hours.  Invalid input(s): PCO2, PO2  Studies/Results:  Anti-infectives: Anti-infectives (From admission, onward)   Start     Dose/Rate Route Frequency Ordered Stop   12/16/20 0600  piperacillin-tazobactam (ZOSYN) IVPB 3.375 g        3.375 g 12.5 mL/hr over 240 Minutes Intravenous Every 8 hours 12/16/20 0133     12/15/20 2230  piperacillin-tazobactam (ZOSYN) IVPB 3.375 g        3.375 g 100 mL/hr over 30 Minutes Intravenous  Once 12/15/20 2217 12/16/20 0015       Assessment/Plan: a  fib on Eliquis (last dose 12/14/20) - hep gtt, tikosyn Cardiomyopathy, Combined systolic and diastolic CHF, Hx pacemaker placement; LVEF echo 11/2020 40-45% CAD with stent OSA CKD IIIa - Cr 1.75 > 1.65 > 1.43, improving  Diverticulitis with perforation, without abscess - Clinically improving from diverticulitis perspective - less pain, ileus resolving, having bowel function -WBC 10.9, downtrending; afebrile -Continue IV abx today - pt made NPO, noted plans for possible cardioversion [facility]) later today  -On heparin gtt   FEN: NPO for possible procedure, ok for FLD + ensure TID from general surgery perspective once cleared by cards for a diet ID: Zosyn VTE: SCD's, hep gtt Foley: none Dispo: ICU, a.fib with RVR per cards, IV abx for diverticulitis    LOS: 3 days   Nadeen Landau, MD [facility] Use [email or web address] to contact on call provider

## 2020-12-18 NOTE — Progress Notes (Signed)
Paged Dr. Blossom Hoops to inform him of At. Fib RVR and all vital signs. EkG was done. Dr. Blossom Hoops will come see patient.

## 2020-12-18 NOTE — Progress Notes (Signed)
Notified by RN that pt has been having A-fib with RVR with low BP the past few hours. RN has been communicating with cardiology. Cardiology has written order for pt to go to ICU with Levophed drip. BP is improved currently and pt has no chest pain and is stable. Cardiology may cardiovert in am per RN report.

## 2020-12-18 NOTE — Progress Notes (Signed)
Called 2 Heart R.N. unable to take report at this time.

## 2020-12-18 NOTE — Progress Notes (Signed)
Text page Dr. Tonie Griffith to notify him of transfer to ICU

## 2020-12-18 NOTE — Anesthesia Procedure Notes (Signed)
Procedure Name: General with mask airway Date/Time: 12/18/2020 1:06 PM Performed by: Lavell Luster, CRNA Pre-anesthesia Checklist: Patient identified, Emergency Drugs available, Suction available, Patient being monitored and Timeout performed Patient Re-evaluated:Patient Re-evaluated prior to induction Oxygen Delivery Method: Ambu bag Preoxygenation: Pre-oxygenation with 100% oxygen Induction Type: IV induction Dental Injury: Teeth and Oropharynx as per pre-operative assessment

## 2020-12-18 NOTE — Progress Notes (Signed)
[place] for Heparin (Apixaban on hold) Indication: atrial fibrillation  Allergies  Allergen Reactions  . Amiodarone Other (See Comments)    Peripheral neuropathy resulted  . Niacin Other (See Comments)    Caused an irregular heartbeat  . Ace Inhibitors Cough  . Mometasone Furoate Other (See Comments)    (Nasonex) Causes nasal bleeding and nose bleeds    Vital Signs: Temp: 97.7 F (36.5 C) (04/09 0746) Temp Source: Axillary (04/09 0746) BP: 99/79 (04/09 0730) Pulse Rate: 144 (04/09 0800)  Labs: Recent Labs    12/15/20 2225 12/16/20 0200 12/16/20 1007 12/16/20 2207 12/17/20 0732 12/17/20 0859 12/17/20 1849 12/17/20 2116 12/18/20 0455  HGB  --  12.4*  --   --  13.5  --   --   --  12.7*  HCT  --  37.5*  --   --  39.6  --   --   --  37.4*  PLT  --  214  --   --  250  --   --   --  262  APTT  --   --  43*   < >  --    < > 117* 123* 102*  HEPARINUNFRC  --   --  1.98*  --  1.48*  --   --   --  0.96*  CREATININE  --  1.70* 1.75*  --  1.65*  --   --   --  1.43*  CKTOTAL 97  --   --   --   --   --   --   --   --    < > = values in this interval not displayed.    Estimated Creatinine Clearance: 48.4 mL/min (A) (by C-G formula based on SCr of 1.43 mg/dL (H)).   Medical History: Past Medical History:  Diagnosis Date  . Allergic rhinitis    takes Allegra daily  . Allergic rhinitis, cause unspecified 06/22/2012  . Arthritis   . Atrial fibrillation ()    takes Tikosyn and Eliquis daily  . 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 Coronary artery disease    LAD stenting 2004 non-DES  . Depression    took Zoloft 15yrs ago but nothing now  . Diverticulosis   . DVT (deep venous thrombosis) ([place])   . Dysphagia    occasionally  . History of blood clots 2003   left leg prior to fem pop  . History of colon polyps   . Hyperlipidemia    takes Tricor and Lipitor daily  . Insomnia    takes Ambien nightly as needed  . Joint  pain   . Joint swelling   . 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 OSA (obstructive sleep apnea)    CPAP  . Pacemaker   . Peripheral vascular disease ([place])   . Pleurisy    early 62's  . Prostatitis   . Urinary urgency     Assessment: 80 y/o M with abdominal pain found to have diverticulitis/perforation. On apixaban PTA for afib. Holding apixaban and starting heparin in case surgery is needed. Last dose on apixaban was 4/5 PM>>ok to start heparin now. Anticipate using aPTT to dose. CBC good. Noted renal dysfunction.   Aptt at goal this AM at 102 on 1800 units/hr, but at highest end of range.  No overt bleeding or complications noted.    Goal of Therapy:  Heparin level 0.3-0.7 units/ml aPTT  66-102 seconds Monitor platelets by anticoagulation protocol: Yes   Plan:  Decrease heparin slightly to 1750 units/hr. Daily CBC/aPTT/heparin level Monitor for bleeding  [place] Crane, Roylene Reason, [facility] Clinical Pharmacist  12/18/2020 11:21 AM   [facility] pharmacy phone numbers are listed on [email or web address]

## 2020-12-18 NOTE — Progress Notes (Signed)
Report called to An[place]a RN on 2 Heart. Patient requesting that wife not be notified til in the a.m. of transfer. Patient sent via bed w/ monitor and R.N. and all belongings to room 2 heart 23

## 2020-12-18 NOTE — Progress Notes (Signed)
Patient remains in At. Fib 140's Bp now 81/57 Dr. Blossom Hoops text paged

## 2020-12-18 NOTE — CV Procedure (Signed)
See note in chart already Semi emergent [facility] for rapid afib in setting of perforated diverticulitis Hypotension on Levophed 6ug/min in patient with low EF and CAD  Anesthesia: Propofol Dr Gifford Shave  [facility] x 2 120J-> 200J Converted to SR with A pacing rate 74 bpm  Patient is on Rx heparin  And Tikosyn No immediate neurologic sequelae Systolic BP improved post DCC 95 mmHg  Wife updated   Jenkins Rouge MD [facility]

## 2020-12-18 NOTE — Progress Notes (Signed)
    Subjective:  No cardiac complaints still in flutter rates 150  Some abdominal pain   Objective:  Vitals:   12/18/20 0600 12/18/20 0640 12/18/20 0700 12/18/20 0730  BP: 106/89 (!) 121/97 (!) 93/51 99/79  Pulse: (!) 143 100 69 (!) 145  Resp: (!) 33 (!) 27 (!) 26 (!) 26  Temp:      TempSrc:      SpO2: 95% 95% 97% 97%  Weight:      Height:        Intake/Output from previous day:  Intake/Output Summary (Last 24 hours) at 12/18/2020 0738 Last data filed at 12/18/2020 0700 Gross per 24 hour  Intake 1077.28 ml  Output 475 ml  Net 602.28 ml    Physical Exam: Elderly male Biotronic Device under right clavicle S1S2 SEM Abdomen BS positive no rebound No edema  Lab Results: Basic Metabolic Panel: Recent Labs    12/17/20 0732 12/18/20 0455  NA 138 139  K 4.0 3.5  CL 106 110  CO2 21* 23  GLUCOSE 102* 115*  BUN 46* 36*  CREATININE 1.65* 1.43*  CALCIUM 9.5 8.8*  MG 2.1 2.0   Liver Function Tests: Recent Labs    12/15/20 1614 12/16/20 0200  AST 44* 30  ALT 35 26  ALKPHOS 83 54  BILITOT 1.5* 1.1  PROT 6.5 5.0*  ALBUMIN 3.1* 2.3*   Recent Labs    12/15/20 1614  LIPASE 22   CBC: Recent Labs    12/17/20 0732 12/18/20 0455  WBC 10.4 10.9*  HGB 13.5 12.7*  HCT 39.6 37.4*  MCV 96.1 96.1  PLT 250 262   Cardiac Enzymes: Recent Labs    12/15/20 2225  CKTOTAL 97    Imaging: No results found.  Cardiac Studies:  ECG:  Orders placed or performed during the hospital encounter of 12/15/20  . ED EKG  . ED EKG  . EKG 12-Lead  . EKG 12-Lead  . EKG 12-Lead  . EKG 12-Lead  . EKG  . EKG 12-Lead  . EKG 12-Lead  . EKG 12-Lead  . EKG 12-Lead  . EKG 12-Lead  . EKG 12-Lead  . EKG 12-Lead  . EKG 12-Lead     Telemetry:Fluter rates 150 ? Pacing spikes seen in QRS ? Inappropriate sensing   Echo: EF 40-45% severe LAE mild MR AV sclerosis   Medications:   . carvedilol  50 mg Oral BID WC  . Chlorhexidine Gluconate Cloth  6 each Topical Daily  .  dofetilide  500 mcg Oral BID  . pantoprazole (PROTONIX) IV  40 mg Intravenous Daily     . heparin 1,800 Units/hr (12/18/20 0700)  . norepinephrine (LEVOPHED) Adult infusion 3 mcg/min (12/18/20 0700)  . piperacillin-tazobactam (ZOSYN)  IV 12.5 mL/hr at 12/18/20 0700    Assessment/Plan:   1. PAF:  On heparin transitioned from eliquis due to perforation of bowel. BP stable on levophed. Getting am dose Of Tikosyn after coreg. Have asked Dr Rayann Heman to look at telemetry as he has ? Inappropriate pacing spikes on QRS Complexes when rate is 150 bpm Discussed possible need for [facility] latter today Keep NPO except sips wt meds Continue antibiotics for bowel perforation Hct K 3.5 Mg 2.0 will supplement BUN 36 Cr 1.43 improved   Jenkins Rouge 12/18/2020, 7:38 AM

## 2020-12-18 NOTE — Transfer of Care (Signed)
Immediate Anesthesia Transfer of Care Note  Patient: [name]  Procedure(s) Performed: CARDIOVERSION (N/A )  Patient Location: SICU  Anesthesia Type:General  Level of Consciousness: awake, alert  and sedated  Airway & Oxygen Therapy: Patient connected to nasal cannula oxygen  Post-op Assessment: Post -op Vital signs reviewed and stable  Post vital signs: stable  Last Vitals:  Vitals Value Taken Time  BP    Temp    Pulse 75 12/18/20 1320  Resp 18 12/18/20 1320  SpO2 98 % 12/18/20 1320  Vitals shown include unvalidated device data.  Last Pain:  Vitals:   12/18/20 1200  TempSrc:   PainSc: 0-No pain      Patients Stated Pain Goal: 3 (17/91/50 5697)  Complications: No complications documented.

## 2020-12-18 NOTE — Anesthesia Preprocedure Evaluation (Signed)
Anesthesia Evaluation  Patient identified by MRN, date of birth, ID band Patient awake    Reviewed: Allergy & Precautions, NPO status , Patient's Chart, lab work & pertinent test results, reviewed documented beta blocker date and time   Airway Mallampati: II  TM Distance: >3 FB Neck ROM: Full    Dental  (+) Teeth Intact, Dental Advisory Given   Pulmonary sleep apnea ,    Pulmonary exam normal breath sounds clear to auscultation       Cardiovascular hypertension, Pt. on home beta blockers and Pt. on medications + CAD, + Cardiac Stents, + Peripheral Vascular Disease, +CHF and + DVT  + dysrhythmias Atrial Fibrillation + pacemaker  Rhythm:Regular Rate:Abnormal     Neuro/Psych PSYCHIATRIC DISORDERS Depression  Neuromuscular disease    GI/Hepatic negative GI ROS, Neg liver ROS,   Endo/Other  negative endocrine ROS  Renal/GU Renal InsufficiencyRenal disease     Musculoskeletal  (+) Arthritis ,   Abdominal   Peds  Hematology  (+) Blood dyscrasia (Eliquis), anemia ,   Anesthesia Other Findings Day of surgery medications reviewed with the patient.  Reproductive/Obstetrics                             Anesthesia Physical Anesthesia Plan  ASA: III  Anesthesia Plan: General   Post-op Pain Management:    Induction: Intravenous  PONV Risk Score and Plan: 2 and Propofol infusion and Treatment may vary due to age or medical condition  Airway Management Planned: Mask  Additional Equipment:   Intra-op Plan:   Post-operative Plan:   Informed Consent: I have reviewed the patients History and Physical, chart, labs and discussed the procedure including the risks, benefits and alternatives for the proposed anesthesia with the patient or authorized representative who has indicated his/her understanding and acceptance.     Dental advisory given  Plan Discussed with: CRNA  Anesthesia Plan Comments:          Anesthesia Quick Evaluation

## 2020-12-18 NOTE — Progress Notes (Signed)
Patient with persistent hypotension despite levophed 6ug/min No chest pain. Dizzy on sitting up and some abdominal distension mild pain To palpation no rebound Long discussion with wife/patient nurse need for  Semi emergent [facility]. His heparin level is Rx.   Anesthesia: Dr Gifford Shave Propofol  [place] x 2 120J-200J synch Converted to NSR rates 70 with intermittent V pacing   Post DCC alert systolic BP 93 mmHg  Wife updated   Jenkins Rouge MD [facility]

## 2020-12-18 NOTE — Anesthesia Postprocedure Evaluation (Signed)
Anesthesia Post Note  Patient: [name]  Procedure(s) Performed: CARDIOVERSION (N/A )     Patient location during evaluation: ICU Anesthesia Type: General Level of consciousness: awake and alert Pain management: pain level controlled Vital Signs Assessment: post-procedure vital signs reviewed and stable Respiratory status: spontaneous breathing, nonlabored ventilation, respiratory function stable and patient connected to nasal cannula oxygen Cardiovascular status: blood pressure returned to baseline and stable Postop Assessment: no apparent nausea or vomiting Anesthetic complications: no   No complications documented.  Last Vitals:  Vitals:   12/18/20 1600 12/18/20 1900  BP: 106/86 94/63  Pulse: 79 71  Resp: (!) 36 (!) 37  Temp:    SpO2: 94% 99%    Last Pain:  Vitals:   12/18/20 1600  TempSrc:   PainSc: 0-No pain                 Catalina Gravel

## 2020-12-19 DIAGNOSIS — Z950 Presence of cardiac pacemaker: Secondary | ICD-10-CM

## 2020-12-19 DIAGNOSIS — I480 Paroxysmal atrial fibrillation: Secondary | ICD-10-CM | POA: Diagnosis not present

## 2020-12-19 LAB — CBC
HCT: 39.5 % (ref 39.0–52.0)
Hemoglobin: 13.3 g/dL (ref 13.0–17.0)
MCH: 32.5 pg (ref 26.0–34.0)
MCHC: 33.7 g/dL (ref 30.0–36.0)
MCV: 96.6 fL (ref 80.0–100.0)
Platelets: 299 10*3/uL (ref 150–400)
RBC: 4.09 MIL/uL — ABNORMAL LOW (ref 4.22–5.81)
RDW: 14.9 % (ref 11.5–15.5)
WBC: 15.3 10*3/uL — ABNORMAL HIGH (ref 4.0–10.5)
nRBC: 0 % (ref 0.0–0.2)

## 2020-12-19 LAB — BASIC METABOLIC PANEL
Anion gap: 6 (ref 5–15)
BUN: 27 mg/dL — ABNORMAL HIGH (ref 8–23)
CO2: 22 mmol/L (ref 22–32)
Calcium: 8.7 mg/dL — ABNORMAL LOW (ref 8.9–10.3)
Chloride: 111 mmol/L (ref 98–111)
Creatinine, Ser: 1.29 mg/dL — ABNORMAL HIGH (ref 0.61–1.24)
GFR, Estimated: 56 mL/min — ABNORMAL LOW (ref >60)
Glucose, Bld: 129 mg/dL — ABNORMAL HIGH (ref 70–99)
Potassium: 4.1 mmol/L (ref 3.5–5.1)
Sodium: 139 mmol/L (ref 135–145)

## 2020-12-19 LAB — APTT: aPTT: 92 seconds — ABNORMAL HIGH (ref 24–36)

## 2020-12-19 LAB — MAGNESIUM: Magnesium: 2.1 mg/dL (ref 1.7–2.4)

## 2020-12-19 LAB — HEPARIN LEVEL (UNFRACTIONATED): Heparin Unfractionated: 0.72 IU/mL — ABNORMAL HIGH (ref 0.30–0.70)

## 2020-12-19 MED ORDER — DIGOXIN 0.25 MG/ML IJ SOLN
0.2500 mg | Freq: Once | INTRAMUSCULAR | Status: AC
  Administered 2020-12-19: 0.25 mg via INTRAVENOUS
  Filled 2020-12-19: qty 2

## 2020-12-19 MED ORDER — DILTIAZEM HCL-DEXTROSE 125-5 MG/125ML-% IV SOLN (PREMIX)
5.0000 mg/h | INTRAVENOUS | Status: DC
  Administered 2020-12-19 (×2): 5 mg/h via INTRAVENOUS
  Filled 2020-12-19 (×2): qty 125

## 2020-12-19 MED ORDER — POTASSIUM CHLORIDE CRYS ER 20 MEQ PO TBCR
20.0000 meq | EXTENDED_RELEASE_TABLET | Freq: Every day | ORAL | Status: DC
  Administered 2020-12-19 – 2020-12-23 (×5): 20 meq via ORAL
  Filled 2020-12-19 (×4): qty 1

## 2020-12-19 MED ORDER — ALUM & MAG HYDROXIDE-SIMETH 200-200-20 MG/5ML PO SUSP
30.0000 mL | ORAL | Status: DC | PRN
  Administered 2020-12-19: 30 mL via ORAL
  Filled 2020-12-19: qty 30

## 2020-12-19 MED ORDER — CARVEDILOL 25 MG PO TABS: 25.0000 mg | ORAL_TABLET | Freq: Two times a day (BID) | ORAL | Status: DC

## 2020-12-19 MED ORDER — CARVEDILOL 25 MG PO TABS
25.0000 mg | ORAL_TABLET | Freq: Two times a day (BID) | ORAL | Status: DC
  Administered 2020-12-19 – 2020-12-25 (×10): 25 mg via ORAL
  Filled 2020-12-19 (×10): qty 1

## 2020-12-19 NOTE — Progress Notes (Signed)
At approx 0030, pt's HR increased to 130s/140s. EKG shows afib RVR. Pt AxOx4, complaining of no pain, and BP 104/73 on no pressors.  Pt was previously cardioverted 4/9 for afib RVR. Cardiology on call Richmond Campbell MD text paged and got orders for digoxin and to start cardizem protocol. Will continue to monitor patient throughout shift.

## 2020-12-19 NOTE — Progress Notes (Addendum)
Subjective S/p cardioversion 4/9 for afib with RVR, hypotension. Now off of pressor support.   States his abd pain is improving but he remains distended. Denies any nausea or emesis. tolerating FLD. Reports multiple loose stools yesterday, non-bloody. Described as green and dark. Pt is eager to get out of the hospital.   Afebrile, WBC 15.3 from 10.9, H&H stable  Objective: Vital signs in last 24 hours: Temp:  [97.4 F (36.3 C)-99.2 F (37.3 C)] 98.7 F (37.1 C) (04/10 0405) Pulse Rate:  [46-145] 78 (04/10 0630) Resp:  [15-37] 36 (04/10 0630) BP: (59-130)/(40-87) 105/59 (04/10 0630) SpO2:  [81 %-100 %] 81 % (04/10 0630) Weight:  [100 kg] 100 kg (04/10 0500) Last BM Date: 12/19/20  Intake/Output from previous day: 04/09 0701 - 04/10 0700 In: 1007.4 [I.V.:822.6; IV Piggyback:184.7] Out: 425 [Urine:425] Intake/Output this shift: No intake/output data recorded.  Gen: NAD, comfortable; up in chair CV: irregularly irregular, HR 80's during my exam a.flutter on monitor Pulm: Normal work of breathing ORA, CTAB Abd: Soft, moderately distended, suprapubic tenderness present but improving, no peritonitis, +BS  Ext: SCDs in place  Lab Results: CBC  Recent Labs    12/18/20 0455 12/19/20 0024  WBC 10.9* 15.3*  HGB 12.7* 13.3  HCT 37.4* 39.5  PLT 262 299   BMET Recent Labs    12/18/20 0455 12/19/20 0024  NA 139 139  K 3.5 4.1  CL 110 111  CO2 23 22  GLUCOSE 115* 129*  BUN 36* 27*  CREATININE 1.43* 1.29*  CALCIUM 8.8* 8.7*   Studies/Results:  Anti-infectives: Anti-infectives (From admission, onward)   Start     Dose/Rate Route Frequency Ordered Stop   12/16/20 0600  piperacillin-tazobactam (ZOSYN) IVPB 3.375 g        3.375 g 12.5 mL/hr over 240 Minutes Intravenous Every 8 hours 12/16/20 0133     12/15/20 2230  piperacillin-tazobactam (ZOSYN) IVPB 3.375 g        3.375 g 100 mL/hr over 30 Minutes Intravenous  Once 12/15/20 2217 12/16/20 0015        Assessment/Plan: a fib on Eliquis (last dose 12/14/20) - hep gtt, tikosyn; afib with RVR s/p [facility] 4/9 Cardiomyopathy, Combined systolic and diastolic CHF, Hx pacemaker placement; LVEF echo 11/2020 40-45% CAD with stent OSA CKD IIIa - Cr 1.75 > 1.65 > 1.43, improving  Diverticulitis with perforation, without abscess - Clinically improving from diverticulitis perspective - less pain, having bowel function; still with signs of ileus -- small amt flatus and moderate abd distention despite BMs.  -WBC 15 from 10.9, afebrile, monitor - Continue IV abx  - wound not advance diet today given amount of abd distention - may need repeat CT abd/pelvis in next 24-48 hours to r/o interval development of abscess if WBC increases or develops fever.  FEN:  FLD, ensure  ID: Zosyn VTE: SCD's, hep gtt Foley: none Dispo: ICU, IV abx for diverticulitis, OOB/mobilize if cleared by cards.    LOS: 4 days   Obie Dredge, [facility] Use [email or web address] to contact on call provider

## 2020-12-19 NOTE — Progress Notes (Signed)
Subjective:  Better this am post [facility] yesterday Taking soft solids BP improved   Objective:  Vitals:   12/19/20 0519 12/19/20 0530 12/19/20 0600 12/19/20 0630  BP: (!) 111/59 (!) 110/53 98/60 (!) 105/59  Pulse: 76 (!) 59 60 78  Resp: (!) 27 (!) 29 (!) 31 (!) 36  Temp:      TempSrc:      SpO2: 91% 95% 96% (!) 81%  Weight:      Height:        Intake/Output from previous day:  Intake/Output Summary (Last 24 hours) at 12/19/2020 0812 Last data filed at 12/19/2020 0600 Gross per 24 hour  Intake 965.75 ml  Output 425 ml  Net 540.75 ml    Physical Exam: Elderly male Biotronic Device under right clavicle S1S2 SEM Abdomen BS positive no rebound No edema  Lab Results: Basic Metabolic Panel: Recent Labs    12/18/20 0455 12/19/20 0024  NA 139 139  K 3.5 4.1  CL 110 111  CO2 23 22  GLUCOSE 115* 129*  BUN 36* 27*  CREATININE 1.43* 1.29*  CALCIUM 8.8* 8.7*  MG 2.0 2.1   Liver Function Tests: No results for input(s): AST, ALT, ALKPHOS, BILITOT, PROT, ALBUMIN in the last 72 hours. No results for input(s): LIPASE, AMYLASE in the last 72 hours. CBC: Recent Labs    12/18/20 0455 12/19/20 0024  WBC 10.9* 15.3*  HGB 12.7* 13.3  HCT 37.4* 39.5  MCV 96.1 96.6  PLT 262 299   Cardiac Enzymes: No results for input(s): CKTOTAL, CKMB, CKMBINDEX, TROPONINI in the last 72 hours.  Imaging: No results found.  Cardiac Studies:  ECG:  Orders placed or performed during the hospital encounter of 12/15/20  . ED EKG  . ED EKG  . EKG 12-Lead  . EKG 12-Lead  . EKG 12-Lead  . EKG 12-Lead  . EKG  . EKG 12-Lead  . EKG 12-Lead  . EKG 12-Lead  . EKG 12-Lead  . EKG 12-Lead  . EKG 12-Lead  . EKG 12-Lead  . EKG 12-Lead  . EKG 12-Lead  . EKG 12-Lead  . EKG 12-Lead  . EKG 12-Lead  . EKG 12-Lead  . EKG 12-Lead  . EKG 12-Lead  . EKG 12-Lead  . EKG 12-Lead  . EKG 12-Lead     Telemetry: A pacing NSR   Echo: EF 40-45% severe LAE mild MR AV sclerosis    Medications:   . carvedilol  50 mg Oral BID WC  . Chlorhexidine Gluconate Cloth  6 each Topical Daily  . dofetilide  500 mcg Oral BID  . feeding supplement  237 mL Oral TID BM  . pantoprazole (PROTONIX) IV  40 mg Intravenous Daily  . potassium chloride  40 mEq Oral BID     . diltiazem (CARDIZEM) infusion 5 mg/hr (12/19/20 0600)  . heparin 1,750 Units/hr (12/19/20 0600)  . norepinephrine (LEVOPHED) Adult infusion Stopped (12/18/20 1648)  . piperacillin-tazobactam (ZOSYN)  IV 3.375 g (12/19/20 0622)    Assessment/Plan:   1. PAF:  On heparin consider changing back to [place] Monday if no surgery seems planned for perforated diverticulum Post semi emergent [facility] 12/18/20 for hypotension requiring pressors with rapid rates Continue coreg and change cardizem To PO in am On Tikosyn K/Mg ok supplemented yesterday baseline ECG LBBB  K 4.1 Mg 2.1  2. Hypotension:  Improved off Levophed on Zosyn  3. GI:  Surgery following advancing diet to soft solids perforated diverticulum Surgery does not seem planned  Jenkins Rouge 12/19/2020, 8:12 AM

## 2020-12-19 NOTE — Progress Notes (Signed)
Pharmacy Antibiotic Note  [name] is a 80 y.o. male admitted on 12/15/2020 with intra-abdominal infection.  Pharmacy has been consulted for Zosyn dosing. WBC was trending down, but back up today.  Plan: Zosyn 3.375G IV q8h to be infused over 4 hours Trend WBC, temp, renal function  Length of therapy for Zosyn?  Temp (24hrs), Avg:98.2 F (36.8 C), Min:97.4 F (36.3 C), Max:99.2 F (37.3 C)  Recent Labs  Lab 12/15/20 1614 12/15/20 2339 12/16/20 0200 12/16/20 1007 12/17/20 0732 12/18/20 0455 12/19/20 0024  WBC 15.8*  --  11.4*  --  10.4 10.9* 15.3*  CREATININE 2.13*  --  1.70* 1.75* 1.65* 1.43* 1.29*  LATICACIDVEN  --  1.9  --   --   --   --   --     Estimated Creatinine Clearance: 53.7 mL/min (A) (by C-G formula based on SCr of 1.29 mg/dL (H)).    Allergies  Allergen Reactions  . Amiodarone Other (See Comments)    Peripheral neuropathy resulted  . Niacin Other (See Comments)    Caused an irregular heartbeat  . Ace Inhibitors Cough  . Mometasone Furoate Other (See Comments)    (Nasonex) Causes nasal bleeding and nose bleeds    [place] Crane, Roylene Reason, [facility] Clinical Pharmacist  12/19/2020 1:17 PM   [facility] pharmacy phone numbers are listed on [email or web address]

## 2020-12-19 NOTE — Progress Notes (Signed)
[place] for Heparin (Apixaban on hold) Indication: atrial fibrillation  Allergies  Allergen Reactions  . Amiodarone Other (See Comments)    Peripheral neuropathy resulted  . Niacin Other (See Comments)    Caused an irregular heartbeat  . Ace Inhibitors Cough  . Mometasone Furoate Other (See Comments)    (Nasonex) Causes nasal bleeding and nose bleeds    Vital Signs: Temp: 97.6 F (36.4 C) (04/10 1110) Temp Source: Oral (04/10 1110) BP: 136/67 (04/10 1200) Pulse Rate: 75 (04/10 1200)  Labs: Recent Labs    12/17/20 0732 12/17/20 0859 12/17/20 2116 12/18/20 0455 12/19/20 0024  HGB 13.5  --   --  12.7* 13.3  HCT 39.6  --   --  37.4* 39.5  PLT 250  --   --  262 299  APTT  --    < > 123* 102* 92*  HEPARINUNFRC 1.48*  --   --  0.96* 0.72*  CREATININE 1.65*  --   --  1.43* 1.29*   < > = values in this interval not displayed.    Estimated Creatinine Clearance: 53.7 mL/min (A) (by C-G formula based on SCr of 1.29 mg/dL (H)).   Medical History: Past Medical History:  Diagnosis Date  . Allergic rhinitis    takes Allegra daily  . Allergic rhinitis, cause unspecified 06/22/2012  . Arthritis   . Atrial fibrillation ([place])    takes Tikosyn and Eliquis daily  . 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 Coronary artery disease    LAD stenting 2004 non-DES  . Depression    took Zoloft 28yrs ago but nothing now  . Diverticulosis   . DVT (deep venous thrombosis) ([place])   . Dysphagia    occasionally  . History of blood clots 2003   left leg prior to fem pop  . History of colon polyps   . Hyperlipidemia    takes Tricor and Lipitor daily  . Insomnia    takes Ambien nightly as needed  . Joint pain   . Joint swelling   . 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 OSA (obstructive sleep apnea)    CPAP  . Pacemaker   . Peripheral vascular disease ([place])   . Pleurisy    early 17's  . Prostatitis   . Urinary  urgency     Assessment: 80 y/o M with abdominal pain found to have diverticulitis/perforation. On apixaban PTA for afib. Holding apixaban and starting heparin in case surgery is needed. Last dose on apixaban was 4/5 PM>>ok to start heparin now. Anticipate using aPTT to dose. CBC good. Noted renal dysfunction.   Aptt at goal this AM at 92 on 1750 units/hr, but at highest end of range.  No overt bleeding or complications noted.    Suspect heparin level still falsely elevated due to recent Eliquis.  Goal of Therapy:  Heparin level 0.3-0.7 units/ml aPTT 66-102 seconds Monitor platelets by anticoagulation protocol: Yes   Plan:  Decrease heparin slightly to 1750 units/hr. Daily CBC/aPTT/heparin level Monitor for bleeding  [place] Crane, Roylene Reason, [facility] Clinical Pharmacist  12/19/2020 1:10 PM   [facility] pharmacy phone numbers are listed on [email or web address]

## 2020-12-20 ENCOUNTER — Encounter (HOSPITAL_COMMUNITY): Payer: Self-pay | Admitting: Cardiovascular Disease

## 2020-12-20 ENCOUNTER — Inpatient Hospital Stay (HOSPITAL_COMMUNITY): Payer: Medicare Other

## 2020-12-20 DIAGNOSIS — I5042 Chronic combined systolic (congestive) and diastolic (congestive) heart failure: Secondary | ICD-10-CM | POA: Diagnosis not present

## 2020-12-20 DIAGNOSIS — K5720 Diverticulitis of large intestine with perforation and abscess without bleeding: Secondary | ICD-10-CM | POA: Diagnosis not present

## 2020-12-20 DIAGNOSIS — I480 Paroxysmal atrial fibrillation: Secondary | ICD-10-CM | POA: Diagnosis not present

## 2020-12-20 LAB — BASIC METABOLIC PANEL
Anion gap: 5 (ref 5–15)
BUN: 18 mg/dL (ref 8–23)
CO2: 25 mmol/L (ref 22–32)
Calcium: 9 mg/dL (ref 8.9–10.3)
Chloride: 112 mmol/L — ABNORMAL HIGH (ref 98–111)
Creatinine, Ser: 1.23 mg/dL (ref 0.61–1.24)
GFR, Estimated: 59 mL/min — ABNORMAL LOW (ref >60)
Glucose, Bld: 109 mg/dL — ABNORMAL HIGH (ref 70–99)
Potassium: 3.8 mmol/L (ref 3.5–5.1)
Sodium: 142 mmol/L (ref 135–145)

## 2020-12-20 LAB — CBC
HCT: 39.2 % (ref 39.0–52.0)
Hemoglobin: 13 g/dL (ref 13.0–17.0)
MCH: 32.9 pg (ref 26.0–34.0)
MCHC: 33.2 g/dL (ref 30.0–36.0)
MCV: 99.2 fL (ref 80.0–100.0)
Platelets: 288 10*3/uL (ref 150–400)
RBC: 3.95 MIL/uL — ABNORMAL LOW (ref 4.22–5.81)
RDW: 15.6 % — ABNORMAL HIGH (ref 11.5–15.5)
WBC: 16.1 10*3/uL — ABNORMAL HIGH (ref 4.0–10.5)
nRBC: 0 % (ref 0.0–0.2)

## 2020-12-20 LAB — APTT: aPTT: 80 seconds — ABNORMAL HIGH (ref 24–36)

## 2020-12-20 LAB — HEPARIN LEVEL (UNFRACTIONATED): Heparin Unfractionated: 0.3 IU/mL (ref 0.30–0.70)

## 2020-12-20 MED ORDER — IOHEXOL 300 MG/ML  SOLN
100.0000 mL | Freq: Once | INTRAMUSCULAR | Status: AC | PRN
  Administered 2020-12-20: 100 mL via INTRAVENOUS

## 2020-12-20 MED ORDER — POTASSIUM CHLORIDE CRYS ER 20 MEQ PO TBCR
20.0000 meq | EXTENDED_RELEASE_TABLET | Freq: Once | ORAL | Status: AC
  Administered 2020-12-20: 20 meq via ORAL
  Filled 2020-12-20: qty 1

## 2020-12-20 MED ORDER — DOFETILIDE 500 MCG PO CAPS
500.0000 ug | ORAL_CAPSULE | Freq: Two times a day (BID) | ORAL | Status: DC
  Administered 2020-12-20 – 2020-12-25 (×9): 500 ug via ORAL
  Filled 2020-12-20 (×13): qty 1

## 2020-12-20 MED ORDER — IOHEXOL 9 MG/ML PO SOLN
500.0000 mL | ORAL | Status: AC
  Administered 2020-12-20 (×2): 500 mL via ORAL

## 2020-12-20 MED ORDER — DILTIAZEM HCL 60 MG PO TABS
30.0000 mg | ORAL_TABLET | Freq: Four times a day (QID) | ORAL | Status: DC
  Administered 2020-12-20 – 2020-12-23 (×12): 30 mg via ORAL
  Filled 2020-12-20 (×12): qty 1

## 2020-12-20 MED ORDER — MELATONIN 3 MG PO TABS
3.0000 mg | ORAL_TABLET | Freq: Every day | ORAL | Status: DC
  Administered 2020-12-20 – 2020-12-24 (×6): 3 mg via ORAL
  Filled 2020-12-20 (×6): qty 1

## 2020-12-20 NOTE — Progress Notes (Signed)
Progress Note  Patient Name: [name] Date of Encounter: 12/20/2020  [facility] Cardiologist: No primary care provider on file.   Subjective   Denies any chest pain or palpitations, occasional dyspnea.  Denies abdominal pain  Inpatient Medications    Scheduled Meds: . carvedilol  25 mg Oral BID WC  . Chlorhexidine Gluconate Cloth  6 each Topical Daily  . dofetilide  500 mcg Oral BID  . feeding supplement  237 mL Oral TID BM  . melatonin  3 mg Oral QHS  . pantoprazole (PROTONIX) IV  40 mg Intravenous Daily  . potassium chloride  20 mEq Oral Daily   Continuous Infusions: . diltiazem (CARDIZEM) infusion 5 mg/hr (12/19/20 2127)  . heparin 1,750 Units/hr (12/20/20 0250)  . norepinephrine (LEVOPHED) Adult infusion Stopped (12/18/20 1648)  . piperacillin-tazobactam (ZOSYN)  IV 3.375 g (12/20/20 0512)   PRN Meds: acetaminophen **OR** acetaminophen, alum & mag hydroxide-simeth, HYDROmorphone (DILAUDID) injection, metoprolol tartrate   Vital Signs    Vitals:   12/19/20 2300 12/20/20 0048 12/20/20 0300 12/20/20 0737  BP:  102/60    Pulse:  72    Resp:  (!) 21    Temp: 98 F (36.7 C)  98.2 F (36.8 C) 99.1 F (37.3 C)  TempSrc: Oral  Axillary Oral  SpO2:  95%    Weight:      Height:        Intake/Output Summary (Last 24 hours) at 12/20/2020 0753 Last data filed at 12/19/2020 1900 Gross per 24 hour  Intake 359.47 ml  Output --  Net 359.47 ml   Last 3 Weights 12/19/2020 12/18/2020 12/17/2020  Weight (lbs) 220 lb 7.4 oz 220 lb 10.9 oz 221 lb 9.6 oz  Weight (kg) 100 kg 100.1 kg 100.517 kg      Telemetry    Currently in A. fib with rate 70s to 90s with intermittent V pacing.  Overnight was previously atrial paced- Personally Reviewed  ECG    No new EKG- Personally Reviewed  Physical Exam   GEN: No acute distress.   Neck: No JVD Cardiac:  Irregular, normal rate no murmurs, rubs, or gallops.  Respiratory: Clear to auscultation bilaterally. GI:   Distended MS: No edema Neuro:  Nonfocal  Psych: Normal affect   Labs    High Sensitivity Troponin:  No results for input(s): TROPONINIHS in the last 720 hours.    Chemistry Recent Labs  Lab 12/15/20 1614 12/16/20 0200 12/16/20 1007 12/18/20 0455 12/19/20 0024 12/20/20 0656  NA 136 136   < > 139 139 142  K 4.9 3.4*   < > 3.5 4.1 3.8  CL 105 108   < > 110 111 112*  CO2 20* 21*   < > 23 22 25   GLUCOSE 133* 108*   < > 115* 129* 109*  BUN 46* 44*   < > 36* 27* 18  CREATININE 2.13* 1.70*   < > 1.43* 1.29* 1.23  CALCIUM 10.3 8.3*   < > 8.8* 8.7* 9.0  PROT 6.5 5.0*  --   --   --   --   ALBUMIN 3.1* 2.3*  --   --   --   --   AST 44* 30  --   --   --   --   ALT 35 26  --   --   --   --   ALKPHOS 83 54  --   --   --   --   BILITOT 1.5*  1.1  --   --   --   --   GFRNONAA 31* 40*   < > 49* 56* 59*  ANIONGAP 11 7   < > 6 6 5    < > = values in this interval not displayed.     Hematology Recent Labs  Lab 12/18/20 0455 12/19/20 0024 12/20/20 0656  WBC 10.9* 15.3* 16.1*  RBC 3.89* 4.09* 3.95*  HGB 12.7* 13.3 13.0  HCT 37.4* 39.5 39.2  MCV 96.1 96.6 99.2  MCH 32.6 32.5 32.9  MCHC 34.0 33.7 33.2  RDW 14.9 14.9 15.6*  PLT 262 299 288    BNPNo results for input(s): BNP, PROBNP in the last 168 hours.   DDimer No results for input(s): DDIMER in the last 168 hours.   Radiology    No results found.  Cardiac Studies   Echo 11/24/20: 1. Left ventricular ejection fraction, by estimation, is 40 to 45%. The  left ventricle has mildly decreased function. The left ventricle  demonstrates regional wall motion abnormalities. Abnormal (paradoxical)  septal motion, consistent with left bundle  branch block. The left ventricular internal cavity size was mildly  dilated. Left ventricular diastolic parameters are consistent with Grade  II diastolic dysfunction (pseudonormalization). Elevated left atrial  pressure.  2. Right ventricular systolic function is normal. The right  ventricular  size is moderately enlarged. There is normal pulmonary artery systolic  pressure. The estimated right ventricular systolic pressure is 63.8 mmHg.  3. Left atrial size was severely dilated.  4. Right atrial size was mildly dilated.  5. The mitral valve is normal in structure. Mild mitral valve  regurgitation.  6. The aortic valve is tricuspid. Aortic valve regurgitation is not  visualized. Mild to moderate aortic valve sclerosis/calcification is  present, without any evidence of aortic stenosis.  7. The inferior vena cava is normal in size with greater than 50%  respiratory variability, suggesting right atrial pressure of 3 mmHg.   Patient Profile     80 y.o. 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  Transferred to [place] on 4/9 due to hypotension and A. fib with RVR, underwent urgent cardioversion  Assessment & Plan    Paroxysmal atrial fibrillation -Having paroxysms of atrial fibrillation, currently rate controlled.  Required urgent cardioversion for AF with RVR and hypotension on 4/9 -EP is recommended to continue Tikosyn.  We will continue per their recommendation. -On heparin drip.Transition to [place] once surgical procedures will not be planned. -Continue coreg 25 mg twice daily -Currently on diltiazem drip at 5 mg/h, will DC drip and start oral diltiazem  Chronic combined systolic and diastolic heart failure, EF 40-45% -Hold ACE in setting of AKI.  -Continue Coreg.  Diverticulitis with perforation -Surgery following, on IV antibiotics.  Recommending medical management.  Improving, diet advanced to soft yesterday.  Disposition: discussed with Dr Posey Pronto, [place] to move out of ICU today  For questions or updates, please contact [place] Please  consult [email or web address] for contact info under        Signed, Donato Heinz, MD  12/20/2020, 7:53 AM

## 2020-12-20 NOTE — Progress Notes (Signed)
[facility] Progress Note  2 Days Post-Op  Subjective: CC-  Patient reports abdominal bloating but states that he has little to no pain. He is tolerating a soft diet without nausea or vomiting. Passing small amount of flatus and has had a few flecks of stool but no good BM. WBC up 16.1, afebrile  Objective: Vital signs in last 24 hours: Temp:  [97.6 F (36.4 C)-99.1 F (37.3 C)] 99.1 F (37.3 C) (04/11 0737) Pulse Rate:  [34-135] 97 (04/11 0800) Resp:  [14-36] 22 (04/11 0800) BP: (84-136)/(48-113) 102/55 (04/11 0800) SpO2:  [85 %-100 %] 95 % (04/11 0800) Last BM Date: 12/19/20  Intake/Output from previous day: 04/10 0701 - 04/11 0700 In: 700.6 [I.V.:536.4; IV Piggyback:164.2] Out: -  Intake/Output this shift: Total I/O In: 154.9 [P.O.:120; I.V.:22.5; IV Piggyback:12.4] Out: -   PE: Gen:  Alert, NAD, pleasant Card:  irregular Pulm:  CTAB, no W/R/R, rate and effort normal Abd: moderately distended but soft, tympanic, few BS heard, mild LLQ and suprapubic TTP, no peritonitis Psych: A&Ox3  Lab Results:  Recent Labs    12/19/20 0024 12/20/20 0656  WBC 15.3* 16.1*  HGB 13.3 13.0  HCT 39.5 39.2  PLT 299 288   BMET Recent Labs    12/19/20 0024 12/20/20 0656  NA 139 142  K 4.1 3.8  CL 111 112*  CO2 22 25  GLUCOSE 129* 109*  BUN 27* 18  CREATININE 1.29* 1.23  CALCIUM 8.7* 9.0   PT/INR No results for input(s): LABPROT, INR in the last 72 hours. CMP     Component Value Date/Time   NA 142 12/20/2020 0656   NA 142 07/08/2019 1455   K 3.8 12/20/2020 0656   CL 112 (H) 12/20/2020 0656   CO2 25 12/20/2020 0656   GLUCOSE 109 (H) 12/20/2020 0656   BUN 18 12/20/2020 0656   BUN 17 07/08/2019 1455   CREATININE 1.23 12/20/2020 0656   CREATININE 1.18 08/27/2015 1518   CALCIUM 9.0 12/20/2020 0656   PROT 5.0 (L) 12/16/2020 0200   ALBUMIN 2.3 (L) 12/16/2020 0200   AST 30 12/16/2020 0200   ALT 26 12/16/2020 0200   ALKPHOS 54 12/16/2020 0200   BILITOT  1.1 12/16/2020 0200   GFRNONAA 59 (L) 12/20/2020 0656   GFRAA 66 07/08/2019 1455   Lipase     Component Value Date/Time   LIPASE 22 12/15/2020 1614       Studies/Results: No results found.  Anti-infectives: Anti-infectives (From admission, onward)   Start     Dose/Rate Route Frequency Ordered Stop   12/16/20 0600  piperacillin-tazobactam (ZOSYN) IVPB 3.375 g        3.375 g 12.5 mL/hr over 240 Minutes Intravenous Every 8 hours 12/16/20 0133     12/15/20 2230  piperacillin-tazobactam (ZOSYN) IVPB 3.375 g        3.375 g 100 mL/hr over 30 Minutes Intravenous  Once 12/15/20 2217 12/16/20 0015       Assessment/Plan A fib on Eliquis(last dose 12/14/20) - hep gtt, tikosyn; afib with RVR s/p [facility] 4/9 Cardiomyopathy, Combined systolic and diastolicCHF, Hx pacemaker placement; LVEF echo 11/2020 40-45% CADwith stent OSA CKD IIIa - Cr 1.75 > 1.65 > 1.43>1.23, stable/improving  Diverticulitis with perforation, without abscess - Clinically improving from diverticulitis with less abdominal pain, but he is still distended has a rising WBC. Will repeat CT scan today to rule out intraabdominal abscess. Continue IV abx for now.  FEN:  soft diet, ensure  ID: Zosyn 4/6>> VTE:  SCD's, hep gtt Foley: none   LOS: 5 days    [place], [facility] Surgery 12/20/2020, 8:34 AM Please see Amion for pager number during day hours 7:00am-4:30pm

## 2020-12-20 NOTE — Progress Notes (Signed)
[place] for Heparin (Apixaban on hold) Indication: atrial fibrillation  Allergies  Allergen Reactions  . Amiodarone Other (See Comments)    Peripheral neuropathy resulted  . Niacin Other (See Comments)    Caused an irregular heartbeat  . Ace Inhibitors Cough  . Mometasone Furoate Other (See Comments)    (Nasonex) Causes nasal bleeding and nose bleeds    Vital Signs: Temp: 98.2 F (36.8 C) (04/11 1124) Temp Source: Oral (04/11 1124) BP: 88/65 (04/11 0900) Pulse Rate: 92 (04/11 1300)  Labs: Recent Labs    12/18/20 0455 12/19/20 0024 12/20/20 0639 12/20/20 0656  HGB 12.7* 13.3  --  13.0  HCT 37.4* 39.5  --  39.2  PLT 262 299  --  288  APTT 102* 92* 80*  --   HEPARINUNFRC 0.96* 0.72* 0.30  --   CREATININE 1.43* 1.29*  --  1.23    Estimated Creatinine Clearance: 56.3 mL/min (by C-G formula based on SCr of 1.23 mg/dL).   Medical History: Past Medical History:  Diagnosis Date  . Allergic rhinitis    takes Allegra daily  . Allergic rhinitis, cause unspecified 06/22/2012  . Arthritis   . Atrial fibrillation ([place])    takes Tikosyn and Eliquis daily  . 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 Coronary artery disease    LAD stenting 2004 non-DES  . Depression    took Zoloft 33yrs ago but nothing now  . Diverticulosis   . DVT (deep venous thrombosis) ([place])   . Dysphagia    occasionally  . History of blood clots 2003   left leg prior to fem pop  . History of colon polyps   . Hyperlipidemia    takes Tricor and Lipitor daily  . Insomnia    takes Ambien nightly as needed  . Joint pain   . Joint swelling   . 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 OSA (obstructive sleep apnea)    CPAP  . Pacemaker   . Peripheral vascular disease ([place])   . Pleurisy    early 46's  . Prostatitis   . Urinary urgency     Assessment: 80 y/o M with abdominal pain found to have diverticulitis/perforation. On apixaban  PTA for afib. Holding apixaban and starting heparin in case surgery is needed. Last dose on apixaban was 4/5 PM>>ok to start heparin now. Anticipate using aPTT to dose. CBC good. Noted renal dysfunction.   Aptt at goal this AM at 80 sec on 1750 units/hr.  No overt bleeding or complications noted.    It appears the effect of Eliquis on the heparin levels has now washed out.  Aptt and heparin levels correlating.  Goal of Therapy:  Heparin level 0.3-0.7 units/ml aPTT 66-102 seconds Monitor platelets by anticoagulation protocol: Yes   Plan:  Continue IV heparin at current rate of 1750 units/hr. Will only monitor heparin levels at this point (no more aPTTs) Daily CBC. Monitor for bleeding  [place] Crane, Roylene Reason, [facility] Clinical Pharmacist  12/20/2020 1:32 PM   [facility] pharmacy phone numbers are listed on [email or web address]

## 2020-12-20 NOTE — Progress Notes (Signed)
[facility] Progress Note  Patient: [name]    NLG:[id number]  DOA: 12/15/2020     Date of Service: the patient was seen and examined on 12/20/2020  Brief hospital course: Past medical history of A. fib on Eliquis, chronic combined CHF, CAD, OSA, CKD 3.  Presents with complaints of abdominal pain found to have diverticulitis with microperforation. General surgery cardiology as well as EP consulted. Patient was transferred to ICU under cardiology service due to worsening A. fib with hypotension requiring Levophed. Currently transferred out of the floor back to hospitalist service. Currently plan is conservative measures for diverticulitis.  Assessment and Plan: 1. Diverticulitis with perforation  Intra-abdominal abscess - Diagnosed with sigmoid diverticulitis on 4/2 and now presents with worsening LLQ pain despite Augmentin and tramadol and is found to have progressive inflammatory changes and new free air  - Surgery consulting and much appreciated  Patient was initially improving on IV Zosyn with bowel rest. Diet was advanced and patient started having worsening distention. Repeat CT scan on 4/11 shows 10 cm x 7 cm size abscess. Discussed with Dr. Redmond Pulling on general surgery, recommend n.p.o. after midnight and IR guided drain. We will hold heparin after midnight as well. Discussed with RN.  Discussed with patient and wife at bedside as well.  2.   Chronic a flutter and atrial fibrillation  Prolonged QTC LBBB Hypotension - In rate-controlled atrial fibrillation in ED  - Last dose of Eliquis was pm of 12/14/20  - CHADS-VASc at least 5 (age x2, CAD, CHF, HTN)  - Hold Eliquis, use IV heparin for now for CVA prevention  Hold heparin after midnight to assist with IR guided drain placement. QTC 510 although corrected in the setting of LBBB for 70. EP consulted appreciate assistance. Tikosyn dose currently appropriate but if the renal function remains lower he will require  further adjustment.  Maintain K more than 4 and mag more than 2.  3. Chronic combined systolic and diastolic dysfunction  - Appears compensated  - Echo from March 2022 with EF 08-14%, grade 2 diastolic dysfunction, severe LAE - Treated with IVF hydration in setting of AKI, monitor weight and I/Os, continue Coreg as tolerated   Currently chest x-ray appears volume overloaded and the patient does have some shortness of breath as well but due to him receiving contrast I am currently holding off on IV Lasix.  Also not on any oxygen right now.  4. CAD - He has been exercising regularly without angina  - Continue beta-blocker as tolerated   5. Acute kidney injury superimposed on CKD IIIa  - SCr is 2.13 on admission, up from 1.27 on 12/11/20 - No hydronephrosis on CT  - Likely acute prerenal azotemia in setting of anorexia  - Treated with IVF hydration, hold losartan, renally-dose medications   Diet: Clear liquid diet DVT Prophylaxis: Heparin therapeutic     Advance goals of care discussion: Full code  Family Communication: family was present at bedside, at the time of interview.   Disposition:  Status is: Inpatient  Remains inpatient appropriate because:Needing for IV biotics and minimal oral intake  Dispo: The patient is from: Home              Anticipated d/c is to: Home              Patient currently is not medically stable to d/c.   Difficult to place patient   Subjective: Abdominal distention present. No nausea no vomiting.  Not passing gas.  Physical Exam:  General: Appear in mild distress, no Rash; Oral Mucosa Clear, moist. no Abnormal Neck Mass Or lumps, Conjunctiva normal  Cardiovascular: S1 and S2 Present, no Murmur, Respiratory: good respiratory effort, Bilateral Air entry present and faint basal crackles, no wheezes Abdomen: Bowel Sound absent, distended, tense, mild diffuse tenderness Extremities: no Pedal edema Neurology: alert and oriented to time, place, and  person affect appropriate. no new focal deficit Gait not checked due to patient safety concerns   Vitals:   12/20/20 1600 12/20/20 1700 12/20/20 1705 12/20/20 1733  BP: 105/80  126/72 (!) 115/52  Pulse: 63 79  98  Resp: 18 (!) 33  (!) 25  Temp:    (!) 97.5 F (36.4 C)  TempSrc:    Oral  SpO2: 98% 94%    Weight:      Height:        Intake/Output Summary (Last 24 hours) at 12/20/2020 1939 Last data filed at 12/20/2020 1700 Gross per 24 hour  Intake 714.58 ml  Output --  Net 714.58 ml   Filed Weights   12/17/20 0337 12/18/20 0448 12/19/20 0500  Weight: 100.5 kg 100.1 kg 100 kg    Data Reviewed: I have personally reviewed and interpreted daily labs, tele strips, imaging. I reviewed all nursing notes, pharmacy notes, vitals, pertinent old records I have discussed plan of care as described above with RN and patient/family.  CBC: Recent Labs  Lab 12/16/20 0200 12/17/20 0732 12/18/20 0455 12/19/20 0024 12/20/20 0656  WBC 11.4* 10.4 10.9* 15.3* 16.1*  HGB 12.4* 13.5 12.7* 13.3 13.0  HCT 37.5* 39.6 37.4* 39.5 39.2  MCV 99.7 96.1 96.1 96.6 99.2  PLT 214 250 262 299 259   Basic Metabolic Panel: Recent Labs  Lab 12/16/20 1007 12/17/20 0732 12/18/20 0455 12/19/20 0024 12/20/20 0656  NA 135 138 139 139 142  K 4.3 4.0 3.5 4.1 3.8  CL 106 106 110 111 112*  CO2 19* 21* 23 22 25   GLUCOSE 102* 102* 115* 129* 109*  BUN 52* 46* 36* 27* 18  CREATININE 1.75* 1.65* 1.43* 1.29* 1.23  CALCIUM 9.5 9.5 8.8* 8.7* 9.0  MG 1.9 2.1 2.0 2.1  --     Studies: CT ABDOMEN PELVIS W CONTRAST  Result Date: 12/20/2020 CLINICAL DATA:  Abdominal pain. EXAM: CT ABDOMEN AND PELVIS WITH CONTRAST TECHNIQUE: Multidetector CT imaging of the abdomen and pelvis was performed using the standard protocol following bolus administration of intravenous contrast. CONTRAST:  183mL OMNIPAQUE IOHEXOL 300 MG/ML  SOLN COMPARISON:  December 15, 2020. FINDINGS: Lower chest: No acute abnormality. Hepatobiliary: No  gallstones or biliary dilatation is noted. 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 Pancreas: Unremarkable. No pancreatic ductal dilatation or surrounding inflammatory changes. Spleen: Normal in size without focal abnormality. Adrenals/Urinary Tract: Adrenal glands appear normal. Left renal cyst is noted. No hydronephrosis or renal obstruction is noted. No renal or ureteral calculi are noted. Urinary bladder is decompressed. Stomach/Bowel: Mild gastric distention is noted. There is significantly worsened small bowel dilatation concerning for obstruction. There is interval development of 10.9 x 6.6 cm fluid collection in the left lower quadrant of the pelvis concerning for abscess. Continued findings are noted consistent with sigmoid diverticulitis which appears to have progressed. Vascular/Lymphatic: Aortic atherosclerosis. No enlarged abdominal or pelvic lymph nodes. Reproductive: Prostate is unremarkable. Other: Increased amount of free fluid is noted in the pelvis and around the liver. No definite hernia is noted. Musculoskeletal: No acute or significant osseous findings. IMPRESSION: Interval  development of 10.9 x 6.6 cm fluid collection in the left lower quadrant and pelvis consistent with paradiverticular abscess. This is consistent with worsening sigmoid diverticulitis. Also noted is significantly increased small bowel dilatation concerning for distal small bowel obstruction, most likely related to diverticulitis. Increased amount of free fluid is noted in the pelvis as well as in the right upper quadrant around the liver. These results will be called to the ordering clinician or representative by the Radiologist Assistant, and communication documented in the PACS or zVision Dashboard. Probable hepatic cirrhosis. Aortic Atherosclerosis (ICD10-I70.0). Electronically Signed   By: Marijo Conception M.D.   On: 12/20/2020 16:35   DG CHEST PORT 1 VIEW  Result Date: 12/20/2020 CLINICAL  DATA:  Shortness of breath EXAM: PORTABLE CHEST 1 VIEW COMPARISON:  12/15/2020 FINDINGS: Cardiomegaly, vascular congestion. Bibasilar opacities, likely atelectasis. Left pacer remains in place, unchanged. No acute bony abnormality. IMPRESSION: Cardiomegaly with vascular congestion and bibasilar atelectasis. Electronically Signed   By: Rolm Baptise M.D.   On: 12/20/2020 09:24   DG Abd Portable 1V  Result Date: 12/20/2020 CLINICAL DATA:  Distended abdomen EXAM: PORTABLE ABDOMEN - 1 VIEW COMPARISON:  CT abdomen pelvis 12/15/2020 FINDINGS: Diffusely distended large and small bowel loops.  Distended stomach. No abnormal calcifications.  Bilateral hip replacement. IMPRESSION: Mildly distended large and small bowel loops most consistent with ileus. Electronically Signed   By: Franchot Gallo M.D.   On: 12/20/2020 09:27    Scheduled Meds: . carvedilol  25 mg Oral BID WC  . Chlorhexidine Gluconate Cloth  6 each Topical Daily  . diltiazem  30 mg Oral Q6H  . dofetilide  500 mcg Oral BID  . feeding supplement  237 mL Oral TID BM  . melatonin  3 mg Oral QHS  . pantoprazole (PROTONIX) IV  40 mg Intravenous Daily  . potassium chloride  20 mEq Oral Daily   Continuous Infusions: . heparin 1,750 Units/hr (12/20/20 1738)  . norepinephrine (LEVOPHED) Adult infusion Stopped (12/18/20 1648)  . piperacillin-tazobactam (ZOSYN)  IV 12.5 mL/hr at 12/20/20 1700   PRN Meds: acetaminophen **OR** acetaminophen, alum & mag hydroxide-simeth, HYDROmorphone (DILAUDID) injection, metoprolol tartrate  Time spent: 35 minutes  Author: Berle Mull, MD [facility] 12/20/2020 7:39 PM  To reach On-call, see care teams to locate the attending and reach out via [email or web address]. Between 7PM-7AM, please contact night-coverage If you still have difficulty reaching the attending provider, please page the [facility] (Director on Call) for [facility] on amion for assistance.

## 2020-12-21 ENCOUNTER — Encounter (HOSPITAL_COMMUNITY): Payer: Self-pay | Admitting: Family Medicine

## 2020-12-21 ENCOUNTER — Inpatient Hospital Stay (HOSPITAL_COMMUNITY): Payer: Medicare Other

## 2020-12-21 DIAGNOSIS — I480 Paroxysmal atrial fibrillation: Secondary | ICD-10-CM | POA: Diagnosis not present

## 2020-12-21 DIAGNOSIS — I5042 Chronic combined systolic (congestive) and diastolic (congestive) heart failure: Secondary | ICD-10-CM | POA: Diagnosis not present

## 2020-12-21 LAB — PROTIME-INR
INR: 1.3 — ABNORMAL HIGH (ref 0.8–1.2)
Prothrombin Time: 16.6 seconds — ABNORMAL HIGH (ref 11.4–15.2)

## 2020-12-21 LAB — BASIC METABOLIC PANEL
Anion gap: 11 (ref 5–15)
BUN: 18 mg/dL (ref 8–23)
CO2: 18 mmol/L — ABNORMAL LOW (ref 22–32)
Calcium: 8.9 mg/dL (ref 8.9–10.3)
Chloride: 110 mmol/L (ref 98–111)
Creatinine, Ser: 1.09 mg/dL (ref 0.61–1.24)
GFR, Estimated: >60 mL/min (ref >60)
Glucose, Bld: 89 mg/dL (ref 70–99)
Potassium: 4.2 mmol/L (ref 3.5–5.1)
Sodium: 139 mmol/L (ref 135–145)

## 2020-12-21 LAB — CULTURE, BLOOD (ROUTINE X 2)
Culture: NO GROWTH
Culture: NO GROWTH
Special Requests: ADEQUATE

## 2020-12-21 LAB — CBC
HCT: 40.6 % (ref 39.0–52.0)
Hemoglobin: 13.5 g/dL (ref 13.0–17.0)
MCH: 33.7 pg (ref 26.0–34.0)
MCHC: 33.3 g/dL (ref 30.0–36.0)
MCV: 101.2 fL — ABNORMAL HIGH (ref 80.0–100.0)
Platelets: 328 10*3/uL (ref 150–400)
RBC: 4.01 MIL/uL — ABNORMAL LOW (ref 4.22–5.81)
RDW: 15.8 % — ABNORMAL HIGH (ref 11.5–15.5)
WBC: 16.8 10*3/uL — ABNORMAL HIGH (ref 4.0–10.5)
nRBC: 0 % (ref 0.0–0.2)

## 2020-12-21 MED ORDER — FENTANYL CITRATE (PF) 100 MCG/2ML IJ SOLN
INTRAMUSCULAR | Status: AC | PRN
  Administered 2020-12-21 (×2): 25 ug via INTRAVENOUS

## 2020-12-21 MED ORDER — SODIUM CHLORIDE 0.9 % IV SOLN
INTRAVENOUS | Status: AC | PRN
  Administered 2020-12-21: 250 mL via INTRAVENOUS

## 2020-12-21 MED ORDER — SODIUM CHLORIDE 0.9% FLUSH
5.0000 mL | Freq: Three times a day (TID) | INTRAVENOUS | Status: DC
  Administered 2020-12-21 – 2020-12-25 (×9): 5 mL

## 2020-12-21 MED ORDER — MIDAZOLAM HCL 2 MG/2ML IJ SOLN
INTRAMUSCULAR | Status: AC
  Filled 2020-12-21: qty 2

## 2020-12-21 MED ORDER — HEPARIN (PORCINE) 25000 UT/250ML-% IV SOLN
2100.0000 [IU]/h | INTRAVENOUS | Status: DC
  Administered 2020-12-21: 1750 [IU]/h via INTRAVENOUS
  Administered 2020-12-22: 2000 [IU]/h via INTRAVENOUS
  Administered 2020-12-23 – 2020-12-24 (×3): 2100 [IU]/h via INTRAVENOUS
  Filled 2020-12-21 (×7): qty 250

## 2020-12-21 MED ORDER — FENTANYL CITRATE (PF) 100 MCG/2ML IJ SOLN
INTRAMUSCULAR | Status: AC
  Filled 2020-12-21: qty 2

## 2020-12-21 MED ORDER — LIDOCAINE HCL 1 % IJ SOLN
INTRAMUSCULAR | Status: AC | PRN
  Administered 2020-12-21: 5 mL

## 2020-12-21 MED ORDER — LIDOCAINE HCL 1 % IJ SOLN
INTRAMUSCULAR | Status: AC
  Filled 2020-12-21: qty 20

## 2020-12-21 MED ORDER — MIDAZOLAM HCL 2 MG/2ML IJ SOLN
INTRAMUSCULAR | Status: AC | PRN
  Administered 2020-12-21 (×2): 1 mg via INTRAVENOUS

## 2020-12-21 NOTE — Progress Notes (Signed)
[facility] Progress Note  3 Days Post-Op  Subjective: CC-  Still feels bloated, but he reports very little LLQ abdominal pain. Denies n/v. Passed flatus a few times yesterday and small amounts of stool. CT yesterday showed 10.9 x 6.6 cm fluid collection in the left lower quadrant and pelvis consistent with paradiverticular abscess; dilated small bowel concerning for SBO.  Objective: Vital signs in last 24 hours: Temp:  [97.5 F (36.4 C)-98.6 F (37 C)] 98.6 F (37 C) (04/12 0450) Pulse Rate:  [43-102] 67 (04/12 0450) Resp:  [16-35] 19 (04/12 0450) BP: (87-126)/(52-80) 120/69 (04/12 0450) SpO2:  [91 %-99 %] 97 % (04/12 0450) Weight:  [102.4 kg] 102.4 kg (04/12 0450) Last BM Date: 12/20/20  Intake/Output from previous day: 04/11 0701 - 04/12 0700 In: 373.4 [P.O.:120; I.V.:185.7; IV Piggyback:67.7] Out: 200 [Urine:200] Intake/Output this shift: No intake/output data recorded.  PE: Gen:  Alert, NAD, pleasant Card:  irregular Pulm: rate and effort normal on room air Abd: moderately distended but soft, tympanic, few BS heard, mild LLQ TTP, no peritonitis Psych: A&Ox3  Lab Results:  Recent Labs    12/20/20 0656 12/21/20 0315  WBC 16.1* 16.8*  HGB 13.0 13.5  HCT 39.2 40.6  PLT 288 328   BMET Recent Labs    12/20/20 0656 12/21/20 0315  NA 142 139  K 3.8 4.2  CL 112* 110  CO2 25 18*  GLUCOSE 109* 89  BUN 18 18  CREATININE 1.23 1.09  CALCIUM 9.0 8.9   PT/INR No results for input(s): LABPROT, INR in the last 72 hours. CMP     Component Value Date/Time   NA 139 12/21/2020 0315   NA 142 07/08/2019 1455   K 4.2 12/21/2020 0315   CL 110 12/21/2020 0315   CO2 18 (L) 12/21/2020 0315   GLUCOSE 89 12/21/2020 0315   BUN 18 12/21/2020 0315   BUN 17 07/08/2019 1455   CREATININE 1.09 12/21/2020 0315   CREATININE 1.18 08/27/2015 1518   CALCIUM 8.9 12/21/2020 0315   PROT 5.0 (L) 12/16/2020 0200   ALBUMIN 2.3 (L) 12/16/2020 0200   AST 30 12/16/2020  0200   ALT 26 12/16/2020 0200   ALKPHOS 54 12/16/2020 0200   BILITOT 1.1 12/16/2020 0200   GFRNONAA >60 12/21/2020 0315   GFRAA 66 07/08/2019 1455   Lipase     Component Value Date/Time   LIPASE 22 12/15/2020 1614       Studies/Results: CT ABDOMEN PELVIS W CONTRAST  Result Date: 12/20/2020 CLINICAL DATA:  Abdominal pain. EXAM: CT ABDOMEN AND PELVIS WITH CONTRAST TECHNIQUE: Multidetector CT imaging of the abdomen and pelvis was performed using the standard protocol following bolus administration of intravenous contrast. CONTRAST:  185mL OMNIPAQUE IOHEXOL 300 MG/ML  SOLN COMPARISON:  December 15, 2020. FINDINGS: Lower chest: No acute abnormality. Hepatobiliary: No gallstones or biliary dilatation is noted. 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 Pancreas: Unremarkable. No pancreatic ductal dilatation or surrounding inflammatory changes. Spleen: Normal in size without focal abnormality. Adrenals/Urinary Tract: Adrenal glands appear normal. Left renal cyst is noted. No hydronephrosis or renal obstruction is noted. No renal or ureteral calculi are noted. Urinary bladder is decompressed. Stomach/Bowel: Mild gastric distention is noted. There is significantly worsened small bowel dilatation concerning for obstruction. There is interval development of 10.9 x 6.6 cm fluid collection in the left lower quadrant of the pelvis concerning for abscess. Continued findings are noted consistent with sigmoid diverticulitis which appears to have progressed. Vascular/Lymphatic: Aortic atherosclerosis.  No enlarged abdominal or pelvic lymph nodes. Reproductive: Prostate is unremarkable. Other: Increased amount of free fluid is noted in the pelvis and around the liver. No definite hernia is noted. Musculoskeletal: No acute or significant osseous findings. IMPRESSION: Interval development of 10.9 x 6.6 cm fluid collection in the left lower quadrant and pelvis consistent with paradiverticular  abscess. This is consistent with worsening sigmoid diverticulitis. Also noted is significantly increased small bowel dilatation concerning for distal small bowel obstruction, most likely related to diverticulitis. Increased amount of free fluid is noted in the pelvis as well as in the right upper quadrant around the liver. These results will be called to the ordering clinician or representative by the Radiologist Assistant, and communication documented in the PACS or zVision Dashboard. Probable hepatic cirrhosis. Aortic Atherosclerosis (ICD10-I70.0). Electronically Signed   By: Marijo Conception M.D.   On: 12/20/2020 16:35   DG CHEST PORT 1 VIEW  Result Date: 12/20/2020 CLINICAL DATA:  Shortness of breath EXAM: PORTABLE CHEST 1 VIEW COMPARISON:  12/15/2020 FINDINGS: Cardiomegaly, vascular congestion. Bibasilar opacities, likely atelectasis. Left pacer remains in place, unchanged. No acute bony abnormality. IMPRESSION: Cardiomegaly with vascular congestion and bibasilar atelectasis. Electronically Signed   By: Rolm Baptise M.D.   On: 12/20/2020 09:24   DG Abd Portable 1V  Result Date: 12/20/2020 CLINICAL DATA:  Distended abdomen EXAM: PORTABLE ABDOMEN - 1 VIEW COMPARISON:  CT abdomen pelvis 12/15/2020 FINDINGS: Diffusely distended large and small bowel loops.  Distended stomach. No abnormal calcifications.  Bilateral hip replacement. IMPRESSION: Mildly distended large and small bowel loops most consistent with ileus. Electronically Signed   By: Franchot Gallo M.D.   On: 12/20/2020 09:27    Anti-infectives: Anti-infectives (From admission, onward)   Start     Dose/Rate Route Frequency Ordered Stop   12/16/20 0600  piperacillin-tazobactam (ZOSYN) IVPB 3.375 g        3.375 g 12.5 mL/hr over 240 Minutes Intravenous Every 8 hours 12/16/20 0133     12/15/20 2230  piperacillin-tazobactam (ZOSYN) IVPB 3.375 g        3.375 g 100 mL/hr over 30 Minutes Intravenous  Once 12/15/20 2217 12/16/20 0015        Assessment/Plan A fib on Eliquis(last dose 12/14/20) - hep gtt, tikosyn; afib with RVR s/p [facility] 4/9 Cardiomyopathy, Combined systolic and diastolicCHF, Hx pacemaker placement; LVEF echo 11/2020 40-45% CADwith stent OSA CKD IIIa - Cr 1.75 > 1.65 > 1.43>1.23>1.09, stable/improving  Diverticulitis with perforation and abscess - CT 4/11 showed 10.9 x 6.6 cm fluid collection in the left lower quadrant and pelvis consistent with paradiverticular abscess; dilated small bowel concerning for SBO. Patient is passing some flatus - IR has been consulted for drain placement. NPO for possible procedure, ok to restart liquids after drain is placed. Continue IV antibiotics.  FEN:NPO for procedure ID: Zosyn 4/6>> VTE: SCD's, hep gtt (held for procedure) Foley: none   LOS: 6 days    [place], [name] 12/21/2020, 8:59 AM Please see Amion for pager number during day hours 7:00am-4:30pm

## 2020-12-21 NOTE — Consult Note (Signed)
Chief Complaint: Patient was seen in consultation today for diverticular abscess drain placement.  Referring Physician(s): Margie Billet, PA-C  Supervising Physician: Arne Cleveland  Patient Status: [facility] - In-pt  History of Present Illness: [name] is a 80 y.o. male with a past medical history significant for a.fib, CHF, CAD s/p stenting, DVT, HLD, CKD and OSA who initially presented to [facility] ED on 12/11/20 with complaints of abdominal pain and imaging at that time showed acute sigmoid diverticulitis - he was started on Augmentin and discharged with outpatient follow up. His pain continued to worsen over the next few days and he again presented to [facility] 4/6  where imaging showed an interval increase in the previously seen pelvic pericolonic inflammatory changes as well as pneumoperitoneum and reactive ileus. He was admitted and general surgery was consulted, he was started on IV antibiotics and bowel rest. Repeat imaging on 4/11 showed interval development of 10.9 x 6.6 cm fluid collection of the LLQ consistent with paradiverticular abscess. IR has been consulted for possible percutaneous drain placement.  [name] denies any pain currently but does occasionally have LLQ pain that is related to movement and eating. He is wondering what the drain will look like and how to care for it. He denies any other complaints and is ready to move forward with drain placement.  Past Medical History:  Diagnosis Date  . Allergic rhinitis    takes Allegra daily  . Allergic rhinitis, cause unspecified 06/22/2012  . Arthritis   . Atrial fibrillation ([place])    takes Tikosyn and Eliquis daily  . 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 Coronary artery disease    LAD stenting 2004 non-DES  . Depression    took Zoloft 67yrs ago but nothing now  . Diverticulosis   . DVT (deep venous thrombosis) ([place])   . Dysphagia    occasionally  . History of blood clots 2003   left leg prior to fem pop   . History of colon polyps   . Hyperlipidemia    takes Tricor and Lipitor daily  . Insomnia    takes Ambien nightly as needed  . Joint pain   . Joint swelling   . 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 OSA (obstructive sleep apnea)    CPAP  . Pacemaker   . Peripheral vascular disease ([place])   . Pleurisy    early 32's  . Prostatitis   . Urinary urgency     Past Surgical History:  Procedure Laterality Date  . CARDIAC CATHETERIZATION  2004  . CARDIAC CATHETERIZATION N/A 05/06/2015   Procedure: Left Heart Cath and Coronary Angiography;  Surgeon: Belva Crome, MD;  Location: [place] CV LAB;  Service: Cardiovascular;  Laterality: N/A;  . CARDIOVERSION N/A 12/18/2020   Procedure: CARDIOVERSION;  Surgeon: Josue Hector, MD;  Location: [facility] OR;  Service: Cardiovascular;  Laterality: N/A;  . COLONOSCOPY    . hand and arm surgery Right    as a teenager  . HAND SURGERY Left   . INGUINAL HERNIA REPAIR Right   . JOINT REPLACEMENT Left December 09, 2013   Left Hip   . JOINT REPLACEMENT Right Aug. 25, 2015   Right Hip  . left knee surgery    . PACEMAKER INSERTION     due to bradycardia  . PERMANENT PACEMAKER GENERATOR CHANGE N/A 06/17/2012   Procedure: PERMANENT PACEMAKER GENERATOR CHANGE;  Surgeon: Deboraha Sprang, MD;  Location: [place] CATH LAB;  Service: Cardiovascular;  Laterality: N/A;  . right and left fem-pop bypass    . stent (unspec)     x2  . TOTAL HIP ARTHROPLASTY Left 12/09/2013   Procedure: TOTAL HIP ARTHROPLASTY ANTERIOR APPROACH;  Surgeon: Hessie Dibble, MD;  Location: [place];  Service: Orthopedics;  Laterality: Left;  left anterior total hip arthroplasty  . TOTAL HIP ARTHROPLASTY Right 05/05/2014   Procedure: TOTAL HIP ARTHROPLASTY ANTERIOR APPROACH;  Surgeon: Hessie Dibble, MD;  Location: [place];  Service: Orthopedics;  Laterality: Right;  . TURBINATE REDUCTION    . wisdom extracted       Allergies: Amiodarone, Niacin, Ace inhibitors, and Mometasone  furoate  Medications: Prior to Admission medications   Medication Sig Start Date End Date Taking? Authorizing Provider  amoxicillin-clavulanate (AUGMENTIN) 875-125 MG tablet Take 1 tablet by mouth 2 (two) times daily. One po bid x 7 days 12/11/20  Yes Pfeiffer, Jeannie Done, MD  atorvastatin (LIPITOR) 80 MG tablet TAKE 1 TABLET BY MOUTH  DAILY Patient taking differently: Take 80 mg by mouth daily. 09/23/20  Yes Deboraha Sprang, MD  CALCIUM PO Take 600 mg by mouth 2 (two) times daily.   Yes [provider]  carvedilol (COREG) 25 MG tablet TAKE 2 TABLETS BY MOUTH  TWICE DAILY Patient taking differently: Take 50 mg by mouth 2 (two) times daily with a meal. 09/23/20  Yes Deboraha Sprang, MD  Cholecalciferol (D3 VITAMIN PO) Take 4,000 Units by mouth daily.   Yes [provider]  Coenzyme Q10 (COQ-10 PO) Take 300 mg by mouth daily.   Yes [provider]  Cyanocobalamin (VITAMIN B 12) 500 MCG TABS Take 500 mcg by mouth 3 (three) times a week.   Yes [provider]  dofetilide (TIKOSYN) 500 MCG capsule TAKE 1 CAPSULE BY MOUTH  TWICE DAILY Patient taking differently: Take 500 mcg by mouth 2 (two) times daily. 09/23/20  Yes Deboraha Sprang, MD  ELIQUIS 5 MG TABS tablet TAKE 1 TABLET BY MOUTH  TWICE DAILY Patient taking differently: Take 5 mg by mouth 2 (two) times daily. 09/23/20  Yes Deboraha Sprang, MD  fenofibrate 160 MG tablet TAKE 1 TABLET BY MOUTH  DAILY Patient taking differently: Take 160 mg by mouth daily. 09/23/20  Yes Deboraha Sprang, MD  fexofenadine (ALLEGRA) 180 MG tablet Take 180 mg by mouth daily.   Yes [provider]  Fluocinonide Emulsified Base 0.05 % CREA APPLY TO AFFECTED AREA(S)  TWO TIMES DAILY AS NEEDED 01/16/19  Yes Biagio Borg, MD  folic acid (FOLVITE) 789 MCG tablet Take 400 mcg by mouth daily.   Yes [provider]  levofloxacin (LEVAQUIN) 500 MG tablet Take 500 mg by mouth daily. 12/12/20  Yes [provider]  losartan  (COZAAR) 25 MG tablet TAKE 1 TABLET BY MOUTH  DAILY Patient taking differently: Take 12.5 mg by mouth in the morning and at bedtime. 09/23/20  Yes Deboraha Sprang, MD  Lysine 500 MG TABS Take 500 mg by mouth 3 (three) times daily.   Yes [provider]  Magnesium 250 MG TABS Take 250 mg by mouth 2 (two) times daily.    Yes [provider]  metroNIDAZOLE (FLAGYL) 500 MG tablet Take 500 mg by mouth 2 (two) times daily. 12/12/20  Yes [provider]  potassium chloride (KLOR-CON) 10 MEQ tablet TAKE 1 TABLET BY MOUTH  TWICE DAILY Patient taking differently: Take 10 mEq by mouth daily.  09/23/20  Yes Deboraha Sprang, MD  ranolazine (RANEXA) 500 MG 12 hr tablet Take 1 tablet (500 mg total) by mouth 2 (two) times daily. 06/01/20  Yes Deboraha Sprang, MD  traMADol (ULTRAM) 50 MG tablet 1-2 tablets every 6 hours as needed for pain Patient taking differently: Take 50-100 mg by mouth every 6 (six) hours as needed for moderate pain. 12/11/20  Yes Charlesetta Shanks, MD  ranitidine (ZANTAC) 300 MG tablet Take 1 tablet (300 mg total) by mouth at bedtime. Patient not taking: Reported on 12/17/2020 03/08/17   Jiles Prows, MD     Family History  Problem Relation Age of Onset  . Diabetes Other        family hx  . Sleep apnea Other        family hx  . Diabetes Mother   . Heart disease Mother        Before age 37  . Heart disease Father        Before age 63  . Heart attack Father   . Stroke Father   . Aneurysm Father        bilat pop art aneurysms  . Peripheral vascular disease Father        Popliteal Aneurysm  . Allergic rhinitis Neg Hx   . Angioedema Neg Hx   . Asthma Neg Hx   . Eczema Neg Hx   . Immunodeficiency Neg Hx   . Urticaria Neg Hx     Social History   Socioeconomic History  . Marital status: Married    Spouse name: Not on file  . Number of children: Not on file  . Years of education: Not on file  . Highest education level: Not on file  Occupational History  .  Not on file  Tobacco Use  . Smoking status: Never Smoker  . Smokeless tobacco: Never Used  Vaping Use  . Vaping Use: Never used  Substance and Sexual Activity  . Alcohol use: No    Alcohol/week: 0.0 standard drinks    Comment: nothing since 2000  . Drug use: No  . Sexual activity: Yes  Other Topics Concern  . 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    Social Determinants of Health   Financial Resource Strain: Low Risk   . Difficulty of Paying Living Expenses: Not hard at all  Food Insecurity: No Food Insecurity  . Worried About Charity fundraiser in the Last Year: Never true  . Ran Out of Food in the Last Year: Never true  Transportation Needs: No Transportation Needs  . Lack of Transportation (Medical): No  . Lack of Transportation (Non-Medical): No  Physical Activity: Sufficiently Active  . Days of Exercise per Week: 7 days  . Minutes of Exercise per Session: 60 min  Stress: No Stress Concern Present  . Feeling of Stress : Not at all  Social Connections: Socially Integrated  . Frequency of Communication with Friends and Family: More than three times a week  . Frequency of Social Gatherings with Friends and Family: More than three times a week  . Attends Religious Services: More than 4 times per year  . Active Member of Clubs or Organizations: Yes  . Attends Archivist Meetings: More than 4 times per year  . Marital Status: Married     Review of Systems: A 12 point ROS discussed and pertinent positives are indicated in the HPI above.  All other systems are negative.  Review of Systems  Constitutional: Negative for chills and fever.  Respiratory: Negative for cough and shortness of breath.   Cardiovascular: Negative for chest pain.  Gastrointestinal: Negative for abdominal pain, diarrhea, nausea and vomiting.  Musculoskeletal: Negative for back pain.  Neurological: Negative for dizziness and headaches.     Vital Signs: BP 120/69 (BP Location: Left Arm)   Pulse 67   Temp 98.6 F (37 C) (Oral)   Resp 19   Ht 6\' 3"  (1.905 m)   Wt 225 lb 12 oz (102.4 kg)   SpO2 97%   BMI 28.22 kg/m   Physical Exam Vitals and nursing note reviewed.  Constitutional:      General: He is not in acute distress. HENT:     Head: Normocephalic.     Mouth/Throat:     Mouth: Mucous membranes are moist.     Pharynx: Oropharynx is clear. No oropharyngeal exudate or posterior oropharyngeal erythema.  Cardiovascular:     Rate and Rhythm: Normal rate. Rhythm irregular.  Pulmonary:     Effort: Pulmonary effort is normal.     Breath sounds: Normal breath sounds.  Abdominal:     General: There is distension.     Tenderness: There is no abdominal tenderness.     Comments: Decreased bowel sounds  Skin:    General: Skin is warm and dry.     Coloration: Skin is not jaundiced.  Neurological:     Mental Status: He is alert and oriented to person, place, and time.  Psychiatric:        Mood and Affect: Mood normal.        Behavior: Behavior normal.        Thought Content: Thought content normal.        Judgment: Judgment normal.      MD Evaluation Airway: WNL Heart: WNL Abdomen: WNL Chest/ Lungs: WNL ASA  Classification: 3 Mallampati/Airway Score: Two   Imaging: CT ABDOMEN PELVIS WO CONTRAST  Result Date: 12/15/2020 CLINICAL DATA:  Acute abdominal pain. Diagnosed with diverticulitis on Saturday and lower abdominal pain since then. EXAM: CT ABDOMEN AND PELVIS WITHOUT CONTRAST TECHNIQUE: Multidetector CT imaging of the abdomen and pelvis was performed following the standard protocol without IV contrast. COMPARISON:  12/11/2020 FINDINGS: Lower chest: Mild dependent changes in the lung bases. Cardiac enlargement. Hepatobiliary: Probable hepatic cirrhosis with atrophy of the medial segment left lobe and nodular contour to the liver. Gallbladder and bile ducts are unremarkable. Pancreas: Diffuse fatty  replacement of the pancreas. No inflammatory changes or ductal dilatation. Spleen: Normal in size without focal abnormality. Adrenals/Urinary Tract: Adrenal glands are unremarkable. Kidneys are normal, without renal calculi, focal lesion, or hydronephrosis. Bladder is unremarkable. Stomach/Bowel: Stomach is not abnormally distended. Significant progression of previous inflammatory process in the pelvis. This likely represents progression of the diverticulitis. There is extensive inflammatory stranding now seen throughout the pelvic fat with dilated fluid-filled small bowel, possibly due to small-bowel obstruction or reactive change. There is new free intra-abdominal air consistent with perforation. No loculated abscess collection is identified. Vascular/Lymphatic: Aortic atherosclerosis. No enlarged abdominal or pelvic lymph nodes. Reproductive: Prostate gland is not enlarged. Other: Small amount of free fluid in the abdomen. Abdominal wall musculature appears intact. Musculoskeletal: Bilateral hip arthroplasties. Degenerative changes in the spine. IMPRESSION: 1. Significant progression of previous inflammatory process in the pelvis likely representing progression of the diverticulitis. New free intra-abdominal air consistent with perforation. Inflammatory stranding and edema throughout the pelvic fat.  Developing small bowel dilatation with fluid-filled loops, probably reactive. No loculated abscess collection is identified. 2. Probable hepatic cirrhosis. 3. Small amount of free fluid in the abdomen. 4. Aortic atherosclerosis. Aortic Atherosclerosis (ICD10-I70.0). Critical Value/emergent results were called by telephone at the time of interpretation on 12/15/2020 at 8:39 pm to provider [facility] , who verbally acknowledged these results. Electronically Signed   By: Lucienne Capers M.D.   On: 12/15/2020 20:44   CT ABDOMEN PELVIS W CONTRAST  Result Date: 12/20/2020 CLINICAL DATA:  Abdominal pain. EXAM: CT  ABDOMEN AND PELVIS WITH CONTRAST TECHNIQUE: Multidetector CT imaging of the abdomen and pelvis was performed using the standard protocol following bolus administration of intravenous contrast. CONTRAST:  176mL OMNIPAQUE IOHEXOL 300 MG/ML  SOLN COMPARISON:  December 15, 2020. FINDINGS: Lower chest: No acute abnormality. Hepatobiliary: No gallstones or biliary dilatation is noted. 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 Pancreas: Unremarkable. No pancreatic ductal dilatation or surrounding inflammatory changes. Spleen: Normal in size without focal abnormality. Adrenals/Urinary Tract: Adrenal glands appear normal. Left renal cyst is noted. No hydronephrosis or renal obstruction is noted. No renal or ureteral calculi are noted. Urinary bladder is decompressed. Stomach/Bowel: Mild gastric distention is noted. There is significantly worsened small bowel dilatation concerning for obstruction. There is interval development of 10.9 x 6.6 cm fluid collection in the left lower quadrant of the pelvis concerning for abscess. Continued findings are noted consistent with sigmoid diverticulitis which appears to have progressed. Vascular/Lymphatic: Aortic atherosclerosis. No enlarged abdominal or pelvic lymph nodes. Reproductive: Prostate is unremarkable. Other: Increased amount of free fluid is noted in the pelvis and around the liver. No definite hernia is noted. Musculoskeletal: No acute or significant osseous findings. IMPRESSION: Interval development of 10.9 x 6.6 cm fluid collection in the left lower quadrant and pelvis consistent with paradiverticular abscess. This is consistent with worsening sigmoid diverticulitis. Also noted is significantly increased small bowel dilatation concerning for distal small bowel obstruction, most likely related to diverticulitis. Increased amount of free fluid is noted in the pelvis as well as in the right upper quadrant around the liver. These results will be called  to the ordering clinician or representative by the Radiologist Assistant, and communication documented in the PACS or zVision Dashboard. Probable hepatic cirrhosis. Aortic Atherosclerosis (ICD10-I70.0). Electronically Signed   By: Marijo Conception M.D.   On: 12/20/2020 16:35   CT Abdomen Pelvis W Contrast  Result Date: 12/11/2020 CLINICAL DATA:  Left groin pain EXAM: CT ABDOMEN AND PELVIS WITH CONTRAST TECHNIQUE: Multidetector CT imaging of the abdomen and pelvis was performed using the standard protocol following bolus administration of intravenous contrast. CONTRAST:  128mL OMNIPAQUE IOHEXOL 300 MG/ML  SOLN COMPARISON:  None. FINDINGS: Lower chest: Cardiomegaly. Pacer wires noted in the right heart. Diffuse calcifications in the visualized right coronary artery. Hepatobiliary: No focal hepatic abnormality. Gallbladder unremarkable. Pancreas: No focal abnormality or ductal dilatation. Spleen: No focal abnormality.  Normal size. Adrenals/Urinary Tract: Left renal parapelvic cysts and small cyst in the lower pole of the left kidney. No hydronephrosis. Adrenal glands and urinary bladder unremarkable. Stomach/Bowel: Scattered diverticulosis, most pronounced in the descending and sigmoid colon. Inflammatory stranding around the proximal sigmoid colon compatible with active diverticulitis. Stomach and small bowel decompressed. Normal appendix. Vascular/Lymphatic: Aortoiliac atherosclerosis. No evidence of aneurysm or adenopathy. Reproductive: Central prostate calcifications. Other: No free fluid or free air. Musculoskeletal: Bilateral hip replacements. No acute bony abnormality. IMPRESSION: Colonic diverticulosis. Changes of active diverticulitis around the proximal sigmoid colon.  Coronary artery disease, aortic atherosclerosis. Electronically Signed   By: Rolm Baptise M.D.   On: 12/11/2020 21:05   DG CHEST PORT 1 VIEW  Result Date: 12/20/2020 CLINICAL DATA:  Shortness of breath EXAM: PORTABLE CHEST 1 VIEW  COMPARISON:  12/15/2020 FINDINGS: Cardiomegaly, vascular congestion. Bibasilar opacities, likely atelectasis. Left pacer remains in place, unchanged. No acute bony abnormality. IMPRESSION: Cardiomegaly with vascular congestion and bibasilar atelectasis. Electronically Signed   By: Rolm Baptise M.D.   On: 12/20/2020 09:24   DG Chest Portable 1 View  Result Date: 12/15/2020 CLINICAL DATA:  Shortness of breath.  Diverticulitis. EXAM: PORTABLE CHEST 1 VIEW COMPARISON:  04/19/2016 FINDINGS: Mild cardiac enlargement. Shallow inspiration with linear atelectasis in the lung bases. No consolidation or airspace disease. No pleural effusions. No pneumothorax. Cardiac pacemaker. IMPRESSION: Shallow inspiration with linear atelectasis in the lung bases. Electronically Signed   By: Lucienne Capers M.D.   On: 12/15/2020 23:22   DG Abd Portable 1V  Result Date: 12/20/2020 CLINICAL DATA:  Distended abdomen EXAM: PORTABLE ABDOMEN - 1 VIEW COMPARISON:  CT abdomen pelvis 12/15/2020 FINDINGS: Diffusely distended large and small bowel loops.  Distended stomach. No abnormal calcifications.  Bilateral hip replacement. IMPRESSION: Mildly distended large and small bowel loops most consistent with ileus. Electronically Signed   By: Franchot Gallo M.D.   On: 12/20/2020 09:27   ECHOCARDIOGRAM COMPLETE  Result Date: 11/24/2020    ECHOCARDIOGRAM REPORT   Patient Name:   JAYDAN MEIDINGER Date of Exam: 11/24/2020 Medical Rec #:  [id number]       Height:       76.0 in Accession #:    [id number]      Weight:       218.0 lb Date of Birth:  1940/11/30        BSA:          2.298 m Patient Age:    89 years        BP:           116/65 mmHg Patient Gender: M               HR:           65 bpm. Exam Location:  [place] Procedure: 2D Echo, 3D Echo, Cardiac Doppler and Color Doppler Indications:    I48.91 Atrial fibrillation  History:        Patient has prior history of Echocardiogram examinations, most                 recent 07/03/2019. CHF,  Pacemaker, PVD, Arrythmias:LBBB; Risk                 Factors:HLD, CKD. Abnormal Stress Test (10/2020).  Sonographer:    Marygrace Drought RCS Referring Phys: [place]  1. Left ventricular ejection fraction, by estimation, is 40 to 45%. The left ventricle has mildly decreased function. The left ventricle demonstrates regional wall motion abnormalities. Abnormal (paradoxical) septal motion, consistent with left bundle branch block. The left ventricular internal cavity size was mildly dilated. Left ventricular diastolic parameters are consistent with Grade II diastolic dysfunction (pseudonormalization). Elevated left atrial pressure.  2. Right ventricular systolic function is normal. The right ventricular size is moderately enlarged. There is normal pulmonary artery systolic pressure. The estimated right ventricular systolic pressure is 40.3 mmHg.  3. Left atrial size was severely dilated.  4. Right atrial size was mildly dilated.  5. The mitral valve is normal in structure. Mild mitral valve  regurgitation.  6. The aortic valve is tricuspid. Aortic valve regurgitation is not visualized. Mild to moderate aortic valve sclerosis/calcification is present, without any evidence of aortic stenosis.  7. The inferior vena cava is normal in size with greater than 50% respiratory variability, suggesting right atrial pressure of 3 mmHg. FINDINGS  Left Ventricle: Left ventricular ejection fraction, by estimation, is 40 to 45%. The left ventricle has mildly decreased function. The left ventricle demonstrates regional wall motion abnormalities. The left ventricular internal cavity size was mildly dilated. There is no left ventricular hypertrophy. Abnormal (paradoxical) septal motion, consistent with left bundle branch block. Left ventricular diastolic parameters are consistent with Grade II diastolic dysfunction (pseudonormalization). Elevated left atrial pressure. Right Ventricle: The right ventricular size is  moderately enlarged. No increase in right ventricular wall thickness. Right ventricular systolic function is normal. There is normal pulmonary artery systolic pressure. The tricuspid regurgitant velocity is 2.32 m/s, and with an assumed right atrial pressure of 3 mmHg, the estimated right ventricular systolic pressure is 08.6 mmHg. Left Atrium: Left atrial size was severely dilated. Right Atrium: Right atrial size was mildly dilated. Pericardium: Trivial pericardial effusion is present. Mitral Valve: The mitral valve is normal in structure. Mild mitral valve regurgitation. Tricuspid Valve: The tricuspid valve is normal in structure. Tricuspid valve regurgitation is mild. Aortic Valve: The aortic valve is tricuspid. Aortic valve regurgitation is not visualized. Mild to moderate aortic valve sclerosis/calcification is present, without any evidence of aortic stenosis. Pulmonic Valve: The pulmonic valve was not well visualized. Pulmonic valve regurgitation is not visualized. Aorta: The aortic root is normal in size and structure. Venous: The inferior vena cava is normal in size with greater than 50% respiratory variability, suggesting right atrial pressure of 3 mmHg. IAS/Shunts: The interatrial septum was not well visualized.  LEFT VENTRICLE PLAX 2D LVIDd:         5.90 cm  Diastology LVIDs:         4.30 cm  LV e' medial:    4.46 cm/s LV PW:         1.50 cm  LV E/e' medial:  18.5 LV IVS:        1.10 cm  LV e' lateral:   5.44 cm/s LVOT diam:     2.40 cm  LV E/e' lateral: 15.1 LV SV:         74 LV SV Index:   32 LVOT Area:     4.52 cm  RIGHT VENTRICLE RV Basal diam:  4.50 cm RV S prime:     13.40 cm/s TAPSE (M-mode): 2.2 cm RVSP:           24.5 mmHg LEFT ATRIUM              Index       RIGHT ATRIUM           Index LA diam:        4.50 cm  1.96 cm/m  RA Pressure: 3.00 mmHg LA Vol (A2C):   133.0 ml 57.88 ml/m RA Area:     24.50 cm LA Vol (A4C):   100.0 ml 43.52 ml/m RA Volume:   78.70 ml  34.25 ml/m LA Biplane Vol:  115.0 ml 50.05 ml/m  AORTIC VALVE LVOT Vmax:   76.40 cm/s LVOT Vmean:  51.800 cm/s LVOT VTI:    0.163 m  AORTA Ao Root diam: 3.70 cm Ao Asc diam:  3.70 cm MITRAL VALVE  TRICUSPID VALVE MV Area (PHT): 6.65 cm    TR Peak grad:   21.5 mmHg MV Decel Time: 114 msec    TR Vmax:        232.00 cm/s MR Peak grad: 97.6 mmHg    Estimated RAP:  3.00 mmHg MR Mean grad: 61.0 mmHg    RVSP:           24.5 mmHg MR Vmax:      494.00 cm/s MR Vmean:     361.0 cm/s   SHUNTS MV E velocity: 82.30 cm/s  Systemic VTI:  0.16 m MV A velocity: 89.10 cm/s  Systemic Diam: 2.40 cm MV E/A ratio:  0.92 Oswaldo Milian MD Electronically signed by Oswaldo Milian MD Signature Date/Time: 11/24/2020/4:45:05 PM    Final     Labs:  CBC: Recent Labs    12/18/20 0455 12/19/20 0024 12/20/20 0656 12/21/20 0315  WBC 10.9* 15.3* 16.1* 16.8*  HGB 12.7* 13.3 13.0 13.5  HCT 37.4* 39.5 39.2 40.6  PLT 262 299 288 328    COAGS: Recent Labs    12/11/20 1828 12/16/20 1007 12/17/20 2116 12/18/20 0455 12/19/20 0024 12/20/20 0639  INR 1.9*  --   --   --   --   --   APTT  --    < > 123* 102* 92* 80*   < > = values in this interval not displayed.    BMP: Recent Labs    12/18/20 0455 12/19/20 0024 12/20/20 0656 12/21/20 0315  NA 139 139 142 139  K 3.5 4.1 3.8 4.2  CL 110 111 112* 110  CO2 23 22 25  18*  GLUCOSE 115* 129* 109* 89  BUN 36* 27* 18 18  CALCIUM 8.8* 8.7* 9.0 8.9  CREATININE 1.43* 1.29* 1.23 1.09  GFRNONAA 49* 56* 59* >60    LIVER FUNCTION TESTS: Recent Labs    06/23/20 1044 12/11/20 1828 12/15/20 1614 12/16/20 0200  BILITOT 0.8 1.6* 1.5* 1.1  AST 23 26 44* 30  ALT 18 24 35 26  ALKPHOS 35* 40 83 54  PROT 6.7 6.4* 6.5 5.0*  ALBUMIN 4.1 3.8 3.1* 2.3*    TUMOR MARKERS: No results for input(s): AFPTM, CEA, CA199, CHROMGRNA in the last 8760 hours.  Assessment and Plan:  80 y/o M admitted 12/15/20 for worsening sigmoid diverticulitis found to have interval development of  paradiverticular abscess on recent imaging. IR has been consulted for percutaneous drain placement - imaging and history reviewed by Dr. Vernard Gambles who approves procedure planned for today.  Patient has been NPO since midnight, previously on heparin gtt which has been held for procedure. Currently receiving Zosyn. Afebrile, WBC 16.8, hgb 13.5, plt 328, INR pending. IR will continue to follow drain while inpatient - please call with questions or concerns.  Risks and benefits discussed with the patient including bleeding, infection, damage to adjacent structures, bowel perforation/fistula connection, and sepsis.  All of the patient's questions were answered, patient is agreeable to proceed.  Consent signed and in chart.  Thank you for this interesting consult.  I greatly enjoyed meeting [name] and look forward to participating in their care.  A copy of this report was sent to the requesting provider on this date.  Electronically Signed: Joaquim Nam, PA-C 12/21/2020, 9:22 AM   I spent a total of 40 Minutes  in face to face in clinical consultation, greater than 50% of which was counseling/coordinating care for diverticular abscess drain placment.

## 2020-12-21 NOTE — Progress Notes (Signed)
[facility] Progress Note  Patient: [name]    SWN:[id number]  DOA: 12/15/2020     Date of Service: the patient was seen and examined on 12/21/2020  Brief hospital course: Past medical history of A. fib on Eliquis, chronic combined CHF, CAD, OSA, CKD 3.  Presents with complaints of abdominal pain found to have diverticulitis with microperforation. General surgery cardiology as well as EP consulted. Patient was transferred to ICU under cardiology service due to worsening A. fib with hypotension requiring Levophed. transferred out of the floor back to hospitalist service on 4/12 Currently plan is continue IV antibiotics and monitor cultures and improvement in ileus  Assessment and Plan: 1. Diverticulitis with perforation  Intra-abdominal abscess Ileus Diagnosed with sigmoid diverticulitis on 4/2 and now presents with worsening LLQ pain despite Augmentin and tramadol and is found to have progressive inflammatory changes and new free air  Surgery consulting and much appreciated  Patient was initially improving on IV Zosyn with bowel rest. Diet was advanced and patient started having worsening distention. Repeat CT scan on 4/11 shows 10 cm x 7 cm size abscess. IR guided drain placed on 4/12. Cultures sent. CT scan also showed evidence of ileus/small bowel obstruction.  Currently improving-passing gas. Diet per general surgery.  2.   Chronic a flutter and atrial fibrillation  Prolonged QTC LBBB Hypotension Last dose of Eliquis was pm of 12/14/20  CHADS-VASc at least 5 (age x2, CAD, CHF, HTN)  Hold Eliquis, use IV heparin for now for CVA prevention  QTC 510 although corrected in the setting of LBBB for 70. EP consulted appreciate assistance. Tikosyn dose currently appropriate but if the renal function remains lower he will require further adjustment. Maintain K more than 4 and mag more than 2 for Tikosyn.  3. Chronic combined systolic and diastolic dysfunction  Appears  compensated  Echo from March 2022 with EF 93-81%, grade 2 diastolic dysfunction, severe LAE Treated with IVF hydration in setting of AKI, monitor weight and I/Os, continue Coreg as tolerated   4/11 chest x-ray shows vascular congestion and the patient does have some shortness of breath as well  not on any oxygen right now and not in significant distress therefore not given Lasix. Management per cardiology.  4. CAD - He has been exercising regularly without angina  - Continue beta-blocker as tolerated   5. Acute kidney injury superimposed on CKD IIIa  - SCr is 2.13 on admission, up from 1.27 on 12/11/20 - No hydronephrosis on CT  - Likely acute prerenal azotemia in setting of anorexia  - Treated with IVF hydration, hold losartan, renally-dose medications   Diet: Clear liquid diet DVT Prophylaxis: Heparin therapeutic  Advance goals of care discussion: Full code  Family Communication: family was present at bedside, at the time of interview.   Disposition:  Status is: Inpatient  Remains inpatient appropriate because:Needing for IV biotics and minimal oral intake  Dispo: The patient is from: Home              Anticipated d/c is to: Home              Patient currently is not medically stable to d/c.   Difficult to place patient   Subjective: Passing gas.  No nausea no vomiting.  Tolerated drain placement very well.  Physical Exam:  General: Appear in mild distress, no Rash; Oral Mucosa Clear, moist. no Abnormal Neck Mass Or lumps, Conjunctiva normal  Cardiovascular: S1 and S2 Present, no Murmur, Respiratory: good  respiratory effort, Bilateral Air entry present and CTA, no Crackles, no wheezes Abdomen: Bowel Sound present, Soft and mild tenderness Extremities: no Pedal edema Neurology: alert and oriented to time, place, and person affect appropriate. no new focal deficit Gait not checked due to patient safety concerns    Vitals:   12/21/20 1010 12/21/20 1015 12/21/20 1025  12/21/20 1300  BP: 104/60 (!) 98/58 102/61   Pulse: 77 78 71 60  Resp: (!) 29 14 (!) 25   Temp:    98.8 F (37.1 C)  TempSrc:    Oral  SpO2: 96% 93% 95%   Weight:      Height:        Intake/Output Summary (Last 24 hours) at 12/21/2020 1851 Last data filed at 12/21/2020 1759 Gross per 24 hour  Intake 100 ml  Output 465 ml  Net -365 ml   Filed Weights   12/18/20 0448 12/19/20 0500 12/21/20 0450  Weight: 100.1 kg 100 kg 102.4 kg    Data Reviewed: I have personally reviewed and interpreted daily labs, tele strips, imaging. I reviewed all nursing notes, pharmacy notes, vitals, pertinent old records I have discussed plan of care as described above with RN and patient/family.  CBC: Recent Labs  Lab 12/17/20 0732 12/18/20 0455 12/19/20 0024 12/20/20 0656 12/21/20 0315  WBC 10.4 10.9* 15.3* 16.1* 16.8*  HGB 13.5 12.7* 13.3 13.0 13.5  HCT 39.6 37.4* 39.5 39.2 40.6  MCV 96.1 96.1 96.6 99.2 101.2*  PLT 250 262 299 288 973   Basic Metabolic Panel: Recent Labs  Lab 12/16/20 1007 12/17/20 0732 12/18/20 0455 12/19/20 0024 12/20/20 0656 12/21/20 0315  NA 135 138 139 139 142 139  K 4.3 4.0 3.5 4.1 3.8 4.2  CL 106 106 110 111 112* 110  CO2 19* 21* 23 22 25  18*  GLUCOSE 102* 102* 115* 129* 109* 89  BUN 52* 46* 36* 27* 18 18  CREATININE 1.75* 1.65* 1.43* 1.29* 1.23 1.09  CALCIUM 9.5 9.5 8.8* 8.7* 9.0 8.9  MG 1.9 2.1 2.0 2.1  --   --     Studies: CT IMAGE GUIDED DRAINAGE BY PERCUTANEOUS CATHETER  Result Date: 12/21/2020 CLINICAL DATA:  Enlarging left pelvic diverticular abscess EXAM: CT GUIDED DRAINAGE OF PELVIC ABSCESS ANESTHESIA/SEDATION: Intravenous Fentanyl 86mcg and Versed 2mg  were administered as conscious sedation during continuous monitoring of the patient's level of consciousness and physiological / cardiorespiratory status by the radiology RN, with a total moderate sedation time of 13 minutes. PROCEDURE: The procedure, risks, benefits, and alternatives were  explained to the patient. Questions regarding the procedure were encouraged and answered. The patient understands and consents to the procedure. Select axial scans through the pelvis were obtained in the left peritoneal collection was localized. An appropriate skin site was determined and marked. The operative field was prepped with chlorhexidinein a sterile fashion, and a sterile drape was applied covering the operative field. A sterile gown and sterile gloves were used for the procedure. Local anesthesia was provided with 1% Lidocaine. Under CT fluoroscopic guidance, 18 gauge percutaneous entry needle advanced into the collection. Purulent material returned. Amplatz guidewire advanced easily, position confirmed on CT. Tract dilated to facilitate placement 12 French pigtail drain catheter, formed centrally within the collection. CT confirms good catheter position. Catheter secured externally 0 Prolene suture and StatLock and placed to gravity drain bag. 20 mL aspirate sent for Gram stain and culture. The patient tolerated the procedure well. COMPLICATIONS: None immediate FINDINGS: Loculated left lower quadrant pelvic peritoneal collection  was localized. 12 French pigtail drain catheter placed as above. Purulent aspirate sample sent for Gram stain and culture. IMPRESSION: Technically successful CT-guided pelvic abscess drain catheter placement. Electronically Signed   By: Lucrezia Europe M.D.   On: 12/21/2020 14:37    Scheduled Meds: . carvedilol  25 mg Oral BID WC  . Chlorhexidine Gluconate Cloth  6 each Topical Daily  . diltiazem  30 mg Oral Q6H  . dofetilide  500 mcg Oral BID  . feeding supplement  237 mL Oral TID BM  . melatonin  3 mg Oral QHS  . pantoprazole (PROTONIX) IV  40 mg Intravenous Daily  . potassium chloride  20 mEq Oral Daily  . sodium chloride flush  5 mL Intracatheter Q8H   Continuous Infusions: . heparin    . norepinephrine (LEVOPHED) Adult infusion Stopped (12/18/20 1648)  .  piperacillin-tazobactam (ZOSYN)  IV 3.375 g (12/21/20 1553)   PRN Meds: acetaminophen **OR** acetaminophen, alum & mag hydroxide-simeth, HYDROmorphone (DILAUDID) injection, metoprolol tartrate  Time spent: 35 minutes  Author: Berle Mull, MD [facility] 12/21/2020 6:51 PM  To reach On-call, see care teams to locate the attending and reach out via [email or web address]. Between 7PM-7AM, please contact night-coverage If you still have difficulty reaching the attending provider, please page the [facility] (Director on Call) for [facility] on amion for assistance.

## 2020-12-21 NOTE — Progress Notes (Signed)
Asked PT about cpap, he states he does not like our masks, and he will bring his mask tomorrow, and go without cpap tonight

## 2020-12-21 NOTE — Progress Notes (Signed)
Progress Note  Patient Name: [name] Date of Encounter: 12/21/2020  [facility] Cardiologist: Evalina Field, MD   Subjective   Denies any chest pain or palpitations, occasional dyspnea.   Inpatient Medications    Scheduled Meds: . carvedilol  25 mg Oral BID WC  . Chlorhexidine Gluconate Cloth  6 each Topical Daily  . diltiazem  30 mg Oral Q6H  . dofetilide  500 mcg Oral BID  . feeding supplement  237 mL Oral TID BM  . melatonin  3 mg Oral QHS  . pantoprazole (PROTONIX) IV  40 mg Intravenous Daily  . potassium chloride  20 mEq Oral Daily  . sodium chloride flush  5 mL Intracatheter Q8H   Continuous Infusions: . heparin    . norepinephrine (LEVOPHED) Adult infusion Stopped (12/18/20 1648)  . piperacillin-tazobactam (ZOSYN)  IV 3.375 g (12/21/20 0604)   PRN Meds: acetaminophen **OR** acetaminophen, alum & mag hydroxide-simeth, HYDROmorphone (DILAUDID) injection, metoprolol tartrate   Vital Signs    Vitals:   12/21/20 1005 12/21/20 1010 12/21/20 1015 12/21/20 1025  BP: 110/62 104/60 (!) 98/58 102/61  Pulse: 71 77 78 71  Resp: (!) 32 (!) 29 14 (!) 25  Temp:      TempSrc:      SpO2: 96% 96% 93% 95%  Weight:      Height:        Intake/Output Summary (Last 24 hours) at 12/21/2020 1225 Last data filed at 12/21/2020 1045 Gross per 24 hour  Intake 226.92 ml  Output 375 ml  Net -148.08 ml   Last 3 Weights 12/21/2020 12/19/2020 12/18/2020  Weight (lbs) 225 lb 12 oz 220 lb 7.4 oz 220 lb 10.9 oz  Weight (kg) 102.4 kg 100 kg 100.1 kg      Telemetry    Currently in A paced rhythm at rate 60- Personally Reviewed  ECG    No new EKG- Personally Reviewed  Physical Exam   GEN: No acute distress.   Neck: No JVD Cardiac:  RRR, rubs, or gallops.  Respiratory: Clear to auscultation bilaterally. GI:  Distended MS: No edema Neuro:  Nonfocal  Psych: Normal affect   Labs    High Sensitivity Troponin:  No results for input(s): TROPONINIHS in the last 720  hours.    Chemistry Recent Labs  Lab 12/15/20 1614 12/16/20 0200 12/16/20 1007 12/19/20 0024 12/20/20 0656 12/21/20 0315  NA 136 136   < > 139 142 139  K 4.9 3.4*   < > 4.1 3.8 4.2  CL 105 108   < > 111 112* 110  CO2 20* 21*   < > 22 25 18*  GLUCOSE 133* 108*   < > 129* 109* 89  BUN 46* 44*   < > 27* 18 18  CREATININE 2.13* 1.70*   < > 1.29* 1.23 1.09  CALCIUM 10.3 8.3*   < > 8.7* 9.0 8.9  PROT 6.5 5.0*  --   --   --   --   ALBUMIN 3.1* 2.3*  --   --   --   --   AST 44* 30  --   --   --   --   ALT 35 26  --   --   --   --   ALKPHOS 83 54  --   --   --   --   BILITOT 1.5* 1.1  --   --   --   --   GFRNONAA 31* 40*   < >  56* 59* >60  ANIONGAP 11 7   < > 6 5 11    < > = values in this interval not displayed.     Hematology Recent Labs  Lab 12/19/20 0024 12/20/20 0656 12/21/20 0315  WBC 15.3* 16.1* 16.8*  RBC 4.09* 3.95* 4.01*  HGB 13.3 13.0 13.5  HCT 39.5 39.2 40.6  MCV 96.6 99.2 101.2*  MCH 32.5 32.9 33.7  MCHC 33.7 33.2 33.3  RDW 14.9 15.6* 15.8*  PLT 299 288 328    BNPNo results for input(s): BNP, PROBNP in the last 168 hours.   DDimer No results for input(s): DDIMER in the last 168 hours.   Radiology    CT ABDOMEN PELVIS W CONTRAST  Result Date: 12/20/2020 CLINICAL DATA:  Abdominal pain. EXAM: CT ABDOMEN AND PELVIS WITH CONTRAST TECHNIQUE: Multidetector CT imaging of the abdomen and pelvis was performed using the standard protocol following bolus administration of intravenous contrast. CONTRAST:  124mL OMNIPAQUE IOHEXOL 300 MG/ML  SOLN COMPARISON:  December 15, 2020. FINDINGS: Lower chest: No acute abnormality. Hepatobiliary: No gallstones or biliary dilatation is noted. 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 Pancreas: Unremarkable. No pancreatic ductal dilatation or surrounding inflammatory changes. Spleen: Normal in size without focal abnormality. Adrenals/Urinary Tract: Adrenal glands appear normal. Left renal cyst is noted. No  hydronephrosis or renal obstruction is noted. No renal or ureteral calculi are noted. Urinary bladder is decompressed. Stomach/Bowel: Mild gastric distention is noted. There is significantly worsened small bowel dilatation concerning for obstruction. There is interval development of 10.9 x 6.6 cm fluid collection in the left lower quadrant of the pelvis concerning for abscess. Continued findings are noted consistent with sigmoid diverticulitis which appears to have progressed. Vascular/Lymphatic: Aortic atherosclerosis. No enlarged abdominal or pelvic lymph nodes. Reproductive: Prostate is unremarkable. Other: Increased amount of free fluid is noted in the pelvis and around the liver. No definite hernia is noted. Musculoskeletal: No acute or significant osseous findings. IMPRESSION: Interval development of 10.9 x 6.6 cm fluid collection in the left lower quadrant and pelvis consistent with paradiverticular abscess. This is consistent with worsening sigmoid diverticulitis. Also noted is significantly increased small bowel dilatation concerning for distal small bowel obstruction, most likely related to diverticulitis. Increased amount of free fluid is noted in the pelvis as well as in the right upper quadrant around the liver. These results will be called to the ordering clinician or representative by the Radiologist Assistant, and communication documented in the PACS or zVision Dashboard. Probable hepatic cirrhosis. Aortic Atherosclerosis (ICD10-I70.0). Electronically Signed   By: Marijo Conception M.D.   On: 12/20/2020 16:35   DG CHEST PORT 1 VIEW  Result Date: 12/20/2020 CLINICAL DATA:  Shortness of breath EXAM: PORTABLE CHEST 1 VIEW COMPARISON:  12/15/2020 FINDINGS: Cardiomegaly, vascular congestion. Bibasilar opacities, likely atelectasis. Left pacer remains in place, unchanged. No acute bony abnormality. IMPRESSION: Cardiomegaly with vascular congestion and bibasilar atelectasis. Electronically Signed   By:  Rolm Baptise M.D.   On: 12/20/2020 09:24   DG Abd Portable 1V  Result Date: 12/20/2020 CLINICAL DATA:  Distended abdomen EXAM: PORTABLE ABDOMEN - 1 VIEW COMPARISON:  CT abdomen pelvis 12/15/2020 FINDINGS: Diffusely distended large and small bowel loops.  Distended stomach. No abnormal calcifications.  Bilateral hip replacement. IMPRESSION: Mildly distended large and small bowel loops most consistent with ileus. Electronically Signed   By: Franchot Gallo M.D.   On: 12/20/2020 09:27    Cardiac Studies   Echo 11/24/20: 1. Left ventricular ejection fraction,  by estimation, is 40 to 45%. The  left ventricle has mildly decreased function. The left ventricle  demonstrates regional wall motion abnormalities. Abnormal (paradoxical)  septal motion, consistent with left bundle  branch block. The left ventricular internal cavity size was mildly  dilated. Left ventricular diastolic parameters are consistent with Grade  II diastolic dysfunction (pseudonormalization). Elevated left atrial  pressure.  2. Right ventricular systolic function is normal. The right ventricular  size is moderately enlarged. There is normal pulmonary artery systolic  pressure. The estimated right ventricular systolic pressure is 59.5 mmHg.  3. Left atrial size was severely dilated.  4. Right atrial size was mildly dilated.  5. The mitral valve is normal in structure. Mild mitral valve  regurgitation.  6. The aortic valve is tricuspid. Aortic valve regurgitation is not  visualized. Mild to moderate aortic valve sclerosis/calcification is  present, without any evidence of aortic stenosis.  7. The inferior vena cava is normal in size with greater than 50%  respiratory variability, suggesting right atrial pressure of 3 mmHg.   Patient Profile     80 y.o. 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  Transferred to [facility] on 4/9 due to hypotension and A. fib with RVR, underwent urgent cardioversion  Assessment & Plan    Paroxysmal atrial fibrillation -Having paroxysms of atrial fibrillation, currently rate controlled.  Required urgent cardioversion for AF with RVR and hypotension on 4/9 -EP is recommended to continue Tikosyn.  We will continue per their recommendation.  Check EKG -On heparin drip.Transition to [place] once surgical procedures will not be planned. -Continue coreg 25 mg twice daily and diltiazem 120 mg daily.  Suspect will be able to discontinue diltiazem as he recovers  Chronic combined systolic and diastolic heart failure, EF 40-45% -Held ACE in setting of AKI, can fold back in if renal function remains stable  -Continue Coreg.  Diverticulitis with perforation -Surgery following, on IV antibiotics.  IR drained pelvic abscess this morning   For questions or updates, please contact [facility] Please consult [email or web address] for contact info under        Signed, Donato Heinz, MD  12/21/2020, 12:25 PM

## 2020-12-21 NOTE — Progress Notes (Signed)
Mobility Specialist: Progress Note   12/21/20 1730  Mobility  Activity Ambulated in hall  Level of Assistance Minimal assist, patient does 75% or more  Assistive Device  (IV Pole)  Distance Ambulated (ft) 470 ft  Mobility Response Tolerated well  Mobility performed by Mobility specialist  $Mobility charge 1 Mobility   Post-Mobility: 78 HR  Pt minA to stand from chair, pt otherwise standby assist. Pt asx during ambulation. Pt to bed after walk per request.   Harrell Gave Ravon Mortellaro Mobility Specialist Mobility Specialist Phone: [phone]

## 2020-12-21 NOTE — Procedures (Signed)
  Procedure: CT drain LLQ abscess 92f EBL:   minimal Complications:  none immediate  See full dictation in [employer].  Dillard Cannon MD Main # [phone] Pager  [phone] Mobile [phone]

## 2020-12-21 NOTE — Care Management Important Message (Signed)
Important Message  Patient Details  Name: [name] MRN: [id number] Date of Birth: 05/30/1941   Medicare Important Message Given:  Yes     Shelda Altes 12/21/2020, 9:57 AM

## 2020-12-21 NOTE — Progress Notes (Addendum)
[place] for Heparin (Apixaban on hold) Indication: atrial fibrillation  Allergies  Allergen Reactions  . Amiodarone Other (See Comments)    Peripheral neuropathy resulted  . Niacin Other (See Comments)    Caused an irregular heartbeat  . Ace Inhibitors Cough  . Mometasone Furoate Other (See Comments)    (Nasonex) Causes nasal bleeding and nose bleeds    Vital Signs: Temp: 98.6 F (37 C) (04/12 0450) Temp Source: Oral (04/12 0450) BP: 102/61 (04/12 1025) Pulse Rate: 71 (04/12 1025)  Labs: Recent Labs    12/19/20 0024 12/20/20 0639 12/20/20 0656 12/21/20 0315 12/21/20 0713  HGB 13.3  --  13.0 13.5  --   HCT 39.5  --  39.2 40.6  --   PLT 299  --  288 328  --   APTT 92* 80*  --   --   --   LABPROT  --   --   --   --  16.6*  INR  --   --   --   --  1.3*  HEPARINUNFRC 0.72* 0.30  --   --   --   CREATININE 1.29*  --  1.23 1.09  --     Estimated Creatinine Clearance: 68.9 mL/min (by C-G formula based on SCr of 1.09 mg/dL).   Medical History: Past Medical History:  Diagnosis Date  . Allergic rhinitis    takes Allegra daily  . Allergic rhinitis, cause unspecified 06/22/2012  . Arthritis   . Atrial fibrillation ([place])    takes Tikosyn and Eliquis daily  . 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 Coronary artery disease    LAD stenting 2004 non-DES  . Depression    took Zoloft 63yrs ago but nothing now  . Diverticulosis   . DVT (deep venous thrombosis) ([place])   . Dysphagia    occasionally  . History of blood clots 2003   left leg prior to fem pop  . History of colon polyps   . Hyperlipidemia    takes Tricor and Lipitor daily  . Insomnia    takes Ambien nightly as needed  . Joint pain   . Joint swelling   . 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 OSA (obstructive sleep apnea)    CPAP  . Pacemaker   . Peripheral vascular disease ([place])   . Pleurisy    early 23's  . Prostatitis   . Urinary  urgency     Assessment: 80 y/o M with abdominal pain found to have diverticulitis/perforation. On apixaban PTA for afib. He is now s/p drain placement for LLQ abscess. Heparin to restart 6 hours post procedure -hg= 13.5, plt= 328   Goal of Therapy:  Heparin level 0.3-0.7 units/ml aPTT 66-102 seconds Monitor platelets by anticoagulation protocol: Yes   Plan:  Restart IV heparin 1750 units/hr at 5pm Heparin level in 8 hours and daily wth CBC daily    Hildred Laser, PharmD Clinical Pharmacist **Pharmacist phone directory can now be found on [email or web address] ([email or web address]).  Listed under [facility].

## 2020-12-22 DIAGNOSIS — N179 Acute kidney failure, unspecified: Secondary | ICD-10-CM | POA: Diagnosis not present

## 2020-12-22 DIAGNOSIS — I480 Paroxysmal atrial fibrillation: Secondary | ICD-10-CM | POA: Diagnosis not present

## 2020-12-22 DIAGNOSIS — N1831 Chronic kidney disease, stage 3a: Secondary | ICD-10-CM | POA: Diagnosis not present

## 2020-12-22 DIAGNOSIS — K5720 Diverticulitis of large intestine with perforation and abscess without bleeding: Secondary | ICD-10-CM | POA: Diagnosis not present

## 2020-12-22 LAB — HEPARIN LEVEL (UNFRACTIONATED)
Heparin Unfractionated: 0.2 IU/mL — ABNORMAL LOW (ref 0.30–0.70)
Heparin Unfractionated: 0.34 IU/mL (ref 0.30–0.70)
Heparin Unfractionated: 0.29 IU/mL — ABNORMAL LOW (ref 0.30–0.70)

## 2020-12-22 LAB — CBC
HCT: 35.6 % — ABNORMAL LOW (ref 39.0–52.0)
Hemoglobin: 12 g/dL — ABNORMAL LOW (ref 13.0–17.0)
MCH: 33.3 pg (ref 26.0–34.0)
MCHC: 33.7 g/dL (ref 30.0–36.0)
MCV: 98.9 fL (ref 80.0–100.0)
Platelets: 283 10*3/uL (ref 150–400)
RBC: 3.6 MIL/uL — ABNORMAL LOW (ref 4.22–5.81)
RDW: 15.5 % (ref 11.5–15.5)
WBC: 8.9 10*3/uL (ref 4.0–10.5)
nRBC: 0 % (ref 0.0–0.2)

## 2020-12-22 LAB — BASIC METABOLIC PANEL
Anion gap: 7 (ref 5–15)
BUN: 16 mg/dL (ref 8–23)
CO2: 22 mmol/L (ref 22–32)
Calcium: 8.6 mg/dL — ABNORMAL LOW (ref 8.9–10.3)
Chloride: 110 mmol/L (ref 98–111)
Creatinine, Ser: 1.11 mg/dL (ref 0.61–1.24)
GFR, Estimated: >60 mL/min (ref >60)
Glucose, Bld: 85 mg/dL (ref 70–99)
Potassium: 4 mmol/L (ref 3.5–5.1)
Sodium: 139 mmol/L (ref 135–145)

## 2020-12-22 NOTE — Progress Notes (Signed)
[place] for Heparin Indication: atrial fibrillation  Allergies  Allergen Reactions  . Amiodarone Other (See Comments)    Peripheral neuropathy resulted  . Niacin Other (See Comments)    Caused an irregular heartbeat  . Ace Inhibitors Cough  . Mometasone Furoate Other (See Comments)    (Nasonex) Causes nasal bleeding and nose bleeds    Vital Signs: Temp: 97.5 F (36.4 C) (04/13 2035) Temp Source: Oral (04/13 2035) BP: 109/53 (04/13 2035) Pulse Rate: 78 (04/13 2035)  Heparin dosing weight = 99 kg  Labs: Recent Labs    12/20/20 0639 12/20/20 0656 12/20/20 0656 12/21/20 0315 12/21/20 0713 12/22/20 0305 12/22/20 1156 12/22/20 1949  HGB  --  13.0   < > 13.5  --  12.0*  --   --   HCT  --  39.2  --  40.6  --  35.6*  --   --   PLT  --  288  --  328  --  283  --   --   APTT 80*  --   --   --   --   --   --   --   LABPROT  --   --   --   --  16.6*  --   --   --   INR  --   --   --   --  1.3*  --   --   --   HEPARINUNFRC 0.30  --   --   --   --  0.20* 0.34 0.29*  CREATININE  --  1.23  --  1.09  --  1.11  --   --    < > = values in this interval not displayed.    Estimated Creatinine Clearance: 62.4 mL/min (by C-G formula based on SCr of 1.11 mg/dL).   Assessment: 80 y/o M with abdominal pain found to have diverticulitis/perforation. On apixaban PTA for afib. He is now s/p drain placement for LLQ abscess and heparin restarted 6 hours post procedure.  Confirmatory heparin level is slightly sub-therapeutic at 0.29 units/mL.  No bleeding reported; no issue with heparin infusion.  Goal of Therapy:  Heparin level 0.3-0.7 units/ml aPTT 66-102 seconds Monitor platelets by anticoagulation protocol: Yes   Plan:  Increase IV heparin to 2100 units/hr F/U AM labs  Eular Panek D. Mina Marble, PharmD, BCPS, [place] 12/22/2020, 8:44 PM

## 2020-12-22 NOTE — Progress Notes (Signed)
Progress Note  Patient Name: [name] Date of Encounter: 12/22/2020  [facility] Cardiologist: Evalina Field, MD   Subjective   Denies any chest pain or dyspnea.   Inpatient Medications    Scheduled Meds: . carvedilol  25 mg Oral BID WC  . Chlorhexidine Gluconate Cloth  6 each Topical Daily  . diltiazem  30 mg Oral Q6H  . dofetilide  500 mcg Oral BID  . feeding supplement  237 mL Oral TID BM  . melatonin  3 mg Oral QHS  . pantoprazole (PROTONIX) IV  40 mg Intravenous Daily  . potassium chloride  20 mEq Oral Daily  . sodium chloride flush  5 mL Intracatheter Q8H   Continuous Infusions: . heparin 2,000 Units/hr (12/22/20 0406)  . norepinephrine (LEVOPHED) Adult infusion Stopped (12/18/20 1648)  . piperacillin-tazobactam (ZOSYN)  IV 3.375 g (12/22/20 0501)   PRN Meds: acetaminophen **OR** acetaminophen, alum & mag hydroxide-simeth, HYDROmorphone (DILAUDID) injection, metoprolol tartrate   Vital Signs    Vitals:   12/21/20 2308 12/22/20 0347 12/22/20 0500 12/22/20 0745  BP: (!) 114/56 (!) 118/58  113/69  Pulse: 60 60  74  Resp: 20 18  (!) 22  Temp: 98 F (36.7 C) 98.5 F (36.9 C)  (!) 97.5 F (36.4 C)  TempSrc: Oral Oral  Oral  SpO2: 94% 92%  92%  Weight:   99.7 kg   Height:        Intake/Output Summary (Last 24 hours) at 12/22/2020 0952 Last data filed at 12/22/2020 0457 Gross per 24 hour  Intake 235.42 ml  Output 295 ml  Net -59.58 ml   Last 3 Weights 12/22/2020 12/21/2020 12/19/2020  Weight (lbs) 219 lb 14.4 oz 225 lb 12 oz 220 lb 7.4 oz  Weight (kg) 99.746 kg 102.4 kg 100 kg      Telemetry    Currently in A paced rhythm at rate 60, intermittent Afib overnight - Personally Reviewed  ECG    No new EKG- Personally Reviewed  Physical Exam   GEN: No acute distress.   Neck: No JVD Cardiac:  RRR, rubs, or gallops.  Respiratory: Clear to auscultation bilaterally. GI:  Distended MS: No edema Neuro:  Nonfocal  Psych: Normal affect   Labs     High Sensitivity Troponin:  No results for input(s): TROPONINIHS in the last 720 hours.    Chemistry Recent Labs  Lab 12/15/20 1614 12/16/20 0200 12/16/20 1007 12/20/20 0656 12/21/20 0315 12/22/20 0305  NA 136 136   < > 142 139 139  K 4.9 3.4*   < > 3.8 4.2 4.0  CL 105 108   < > 112* 110 110  CO2 20* 21*   < > 25 18* 22  GLUCOSE 133* 108*   < > 109* 89 85  BUN 46* 44*   < > 18 18 16   CREATININE 2.13* 1.70*   < > 1.23 1.09 1.11  CALCIUM 10.3 8.3*   < > 9.0 8.9 8.6*  PROT 6.5 5.0*  --   --   --   --   ALBUMIN 3.1* 2.3*  --   --   --   --   AST 44* 30  --   --   --   --   ALT 35 26  --   --   --   --   ALKPHOS 83 54  --   --   --   --   BILITOT 1.5* 1.1  --   --   --   --  GFRNONAA 31* 40*   < > 59* >60 >60  ANIONGAP 11 7   < > 5 11 7    < > = values in this interval not displayed.     Hematology Recent Labs  Lab 12/20/20 0656 12/21/20 0315 12/22/20 0305  WBC 16.1* 16.8* 8.9  RBC 3.95* 4.01* 3.60*  HGB 13.0 13.5 12.0*  HCT 39.2 40.6 35.6*  MCV 99.2 101.2* 98.9  MCH 32.9 33.7 33.3  MCHC 33.2 33.3 33.7  RDW 15.6* 15.8* 15.5  PLT 288 328 283    BNPNo results for input(s): BNP, PROBNP in the last 168 hours.   DDimer No results for input(s): DDIMER in the last 168 hours.   Radiology    CT ABDOMEN PELVIS W CONTRAST  Result Date: 12/20/2020 CLINICAL DATA:  Abdominal pain. EXAM: CT ABDOMEN AND PELVIS WITH CONTRAST TECHNIQUE: Multidetector CT imaging of the abdomen and pelvis was performed using the standard protocol following bolus administration of intravenous contrast. CONTRAST:  143mL OMNIPAQUE IOHEXOL 300 MG/ML  SOLN COMPARISON:  December 15, 2020. FINDINGS: Lower chest: No acute abnormality. Hepatobiliary: No gallstones or biliary dilatation is noted. 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 Pancreas: Unremarkable. No pancreatic ductal dilatation or surrounding inflammatory changes. Spleen: Normal in size without focal abnormality.  Adrenals/Urinary Tract: Adrenal glands appear normal. Left renal cyst is noted. No hydronephrosis or renal obstruction is noted. No renal or ureteral calculi are noted. Urinary bladder is decompressed. Stomach/Bowel: Mild gastric distention is noted. There is significantly worsened small bowel dilatation concerning for obstruction. There is interval development of 10.9 x 6.6 cm fluid collection in the left lower quadrant of the pelvis concerning for abscess. Continued findings are noted consistent with sigmoid diverticulitis which appears to have progressed. Vascular/Lymphatic: Aortic atherosclerosis. No enlarged abdominal or pelvic lymph nodes. Reproductive: Prostate is unremarkable. Other: Increased amount of free fluid is noted in the pelvis and around the liver. No definite hernia is noted. Musculoskeletal: No acute or significant osseous findings. IMPRESSION: Interval development of 10.9 x 6.6 cm fluid collection in the left lower quadrant and pelvis consistent with paradiverticular abscess. This is consistent with worsening sigmoid diverticulitis. Also noted is significantly increased small bowel dilatation concerning for distal small bowel obstruction, most likely related to diverticulitis. Increased amount of free fluid is noted in the pelvis as well as in the right upper quadrant around the liver. These results will be called to the ordering clinician or representative by the Radiologist Assistant, and communication documented in the PACS or zVision Dashboard. Probable hepatic cirrhosis. Aortic Atherosclerosis (ICD10-I70.0). Electronically Signed   By: Marijo Conception M.D.   On: 12/20/2020 16:35   CT IMAGE GUIDED DRAINAGE BY PERCUTANEOUS CATHETER  Result Date: 12/21/2020 CLINICAL DATA:  Enlarging left pelvic diverticular abscess EXAM: CT GUIDED DRAINAGE OF PELVIC ABSCESS ANESTHESIA/SEDATION: Intravenous Fentanyl 62mcg and Versed 2mg  were administered as conscious sedation during continuous monitoring of  the patient's level of consciousness and physiological / cardiorespiratory status by the radiology RN, with a total moderate sedation time of 13 minutes. PROCEDURE: The procedure, risks, benefits, and alternatives were explained to the patient. Questions regarding the procedure were encouraged and answered. The patient understands and consents to the procedure. Select axial scans through the pelvis were obtained in the left peritoneal collection was localized. An appropriate skin site was determined and marked. The operative field was prepped with chlorhexidinein a sterile fashion, and a sterile drape was applied covering the operative field. A sterile gown and sterile  gloves were used for the procedure. Local anesthesia was provided with 1% Lidocaine. Under CT fluoroscopic guidance, 18 gauge percutaneous entry needle advanced into the collection. Purulent material returned. Amplatz guidewire advanced easily, position confirmed on CT. Tract dilated to facilitate placement 12 French pigtail drain catheter, formed centrally within the collection. CT confirms good catheter position. Catheter secured externally 0 Prolene suture and StatLock and placed to gravity drain bag. 20 mL aspirate sent for Gram stain and culture. The patient tolerated the procedure well. COMPLICATIONS: None immediate FINDINGS: Loculated left lower quadrant pelvic peritoneal collection was localized. 12 French pigtail drain catheter placed as above. Purulent aspirate sample sent for Gram stain and culture. IMPRESSION: Technically successful CT-guided pelvic abscess drain catheter placement. Electronically Signed   By: Lucrezia Europe M.D.   On: 12/21/2020 14:37    Cardiac Studies   Echo 11/24/20: 1. Left ventricular ejection fraction, by estimation, is 40 to 45%. The  left ventricle has mildly decreased function. The left ventricle  demonstrates regional wall motion abnormalities. Abnormal (paradoxical)  septal motion, consistent with left  bundle  branch block. The left ventricular internal cavity size was mildly  dilated. Left ventricular diastolic parameters are consistent with Grade  II diastolic dysfunction (pseudonormalization). Elevated left atrial  pressure.  2. Right ventricular systolic function is normal. The right ventricular  size is moderately enlarged. There is normal pulmonary artery systolic  pressure. The estimated right ventricular systolic pressure is 01.6 mmHg.  3. Left atrial size was severely dilated.  4. Right atrial size was mildly dilated.  5. The mitral valve is normal in structure. Mild mitral valve  regurgitation.  6. The aortic valve is tricuspid. Aortic valve regurgitation is not  visualized. Mild to moderate aortic valve sclerosis/calcification is  present, without any evidence of aortic stenosis.  7. The inferior vena cava is normal in size with greater than 50%  respiratory variability, suggesting right atrial pressure of 3 mmHg.   Patient Profile     80 y.o. 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  Transferred to [place] on 4/9 due to hypotension and A. fib with RVR, underwent urgent cardioversion  Assessment & Plan    Paroxysmal atrial fibrillation -Having paroxysms of atrial fibrillation, currently rate controlled.  Required urgent cardioversion for AF with RVR and hypotension on 4/9 -EP is recommended to continue Tikosyn.  We will continue per their recommendation.   -On heparin drip.Transition to [place] once surgical procedures will not be planned. -Continue coreg 25 mg twice daily and diltiazem 120 mg daily.  Suspect will be able to discontinue diltiazem as he recovers  Chronic combined systolic and diastolic heart failure, EF 40-45% -Held ACE in setting of AKI, can  fold back in if renal function remains stable  -Continue Coreg.  Diverticulitis with perforation -Surgery following, on IV antibiotics.  IR drained pelvic abscess 4/12   For questions or updates, please contact [place] Please consult [email or web address] for contact info under        Signed, Donato Heinz, MD  12/22/2020, 9:52 AM

## 2020-12-22 NOTE — Progress Notes (Signed)
Mobility Specialist: Progress Note   12/22/20 1257  Mobility  Activity Ambulated in hall  Level of Assistance Independent  Assistive Device  (IV Pole)  Distance Ambulated (ft) 330 ft  Mobility Response Tolerated well  Mobility performed by Mobility specialist  Bed Position Chair  $Mobility charge 1 Mobility   Pre-Mobility: 66 HR Post-Mobility: 87 HR  Pt c/o feeling generally fatigued today, otherwise asx. Pt to chair after walk with family present in room.   [facility] Charnise Lovan Mobility Specialist Mobility Specialist Phone: [phone]

## 2020-12-22 NOTE — Progress Notes (Signed)
[place] for Heparin (Apixaban on hold) Indication: atrial fibrillation  Allergies  Allergen Reactions  . Amiodarone Other (See Comments)    Peripheral neuropathy resulted  . Niacin Other (See Comments)    Caused an irregular heartbeat  . Ace Inhibitors Cough  . Mometasone Furoate Other (See Comments)    (Nasonex) Causes nasal bleeding and nose bleeds    Vital Signs: Temp: 97.6 F (36.4 C) (04/13 1330) Temp Source: Oral (04/13 1330) BP: 111/61 (04/13 1330) Pulse Rate: 70 (04/13 1330)  Labs: Recent Labs    12/20/20 0639 12/20/20 0656 12/20/20 0656 12/21/20 0315 12/21/20 0713 12/22/20 0305 12/22/20 1156  HGB  --  13.0   < > 13.5  --  12.0*  --   HCT  --  39.2  --  40.6  --  35.6*  --   PLT  --  288  --  328  --  283  --   APTT 80*  --   --   --   --   --   --   LABPROT  --   --   --   --  16.6*  --   --   INR  --   --   --   --  1.3*  --   --   HEPARINUNFRC 0.30  --   --   --   --  0.20* 0.34  CREATININE  --  1.23  --  1.09  --  1.11  --    < > = values in this interval not displayed.    Estimated Creatinine Clearance: 62.4 mL/min (by C-G formula based on SCr of 1.11 mg/dL).   Medical History: Past Medical History:  Diagnosis Date  . Allergic rhinitis    takes Allegra daily  . Allergic rhinitis, cause unspecified 06/22/2012  . Arthritis   . Atrial fibrillation ([place])    takes Tikosyn and Eliquis daily  . 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 Coronary artery disease    LAD stenting 2004 non-DES  . Depression    took Zoloft 40yrs ago but nothing now  . Diverticulosis   . DVT (deep venous thrombosis) ([place])   . Dysphagia    occasionally  . History of blood clots 2003   left leg prior to fem pop  . History of colon polyps   . Hyperlipidemia    takes Tricor and Lipitor daily  . Insomnia    takes Ambien nightly as needed  . Joint pain   . Joint swelling   . 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 OSA (obstructive sleep apnea)    CPAP  . Pacemaker   . Peripheral vascular disease ([place])   . Pleurisy    early 39's  . Prostatitis   . Urinary urgency     Assessment: 80 y/o M with abdominal pain found to have diverticulitis/perforation. On apixaban PTA for afib. He is now s/p drain placement for LLQ abscess. Heparin restarted 6 hours post procedure Heparin level therapeutic today at 0.34. Hgb 12, plt 283   Goal of Therapy:  Heparin level 0.3-0.7 units/ml aPTT 66-102 seconds Monitor platelets by anticoagulation protocol: Yes   Plan:  Continue IV heparin 2000 units/hr Check confirmatory heparin level in 8 hours and daily while on heparin Continue to monitor H&H and platelets F/u transition back to oral [facility]    Thank you for allowing [place] to participate in  this patients care. Jens Som, PharmD 12/22/2020 1:36 PM  Please check [email or web address] for unit-specific pharmacy phone numbers.

## 2020-12-22 NOTE — Progress Notes (Signed)
[place] for Heparin Indication: atrial fibrillation  Allergies  Allergen Reactions  . Amiodarone Other (See Comments)    Peripheral neuropathy resulted  . Niacin Other (See Comments)    Caused an irregular heartbeat  . Ace Inhibitors Cough  . Mometasone Furoate Other (See Comments)    (Nasonex) Causes nasal bleeding and nose bleeds    Vital Signs: Temp: 98.5 F (36.9 C) (04/13 0347) Temp Source: Oral (04/13 0347) BP: 118/58 (04/13 0347) Pulse Rate: 60 (04/13 0347)  Labs: Recent Labs    12/20/20 0639 12/20/20 0656 12/20/20 0656 12/21/20 0315 12/21/20 0713 12/22/20 0305  HGB  --  13.0   < > 13.5  --  12.0*  HCT  --  39.2  --  40.6  --  35.6*  PLT  --  288  --  328  --  283  APTT 80*  --   --   --   --   --   LABPROT  --   --   --   --  16.6*  --   INR  --   --   --   --  1.3*  --   HEPARINUNFRC 0.30  --   --   --   --  0.20*  CREATININE  --  1.23  --  1.09  --  1.11   < > = values in this interval not displayed.    Estimated Creatinine Clearance: 67.7 mL/min (by C-G formula based on SCr of 1.11 mg/dL).  Assessment: 80 y.o. male with h/o Afib, Eliquis on hold, for heparin  Goal of Therapy:  Heparin level 0.3-0.7 units/ml aPTT 66-102 seconds Monitor platelets by anticoagulation protocol: Yes   Plan:  Increase Heparin  2000 units/hr Check heparin level in 8 hours.  Phillis Knack, PharmD, BCPS

## 2020-12-22 NOTE — Progress Notes (Signed)
Referring Physician(s): Margie Billet, PA-C  Supervising Physician: Corrie Mckusick  Patient Status:  [facility] - In-pt  Chief Complaint:  S/p LLQ diverticular abscess drain placement with Dr. Vernard Gambles on 12/21/20   Subjective:  Patient doing well, no complaints today.  Denies fever, abdominal pain, nausea, and vomiting.  Vitals, I/O, labs reviewed.   Allergies: Amiodarone, Niacin, Ace inhibitors, and Mometasone furoate  Medications: Prior to Admission medications   Medication Sig Start Date End Date Taking? Authorizing Provider  amoxicillin-clavulanate (AUGMENTIN) 875-125 MG tablet Take 1 tablet by mouth 2 (two) times daily. One po bid x 7 days 12/11/20  Yes Pfeiffer, Jeannie Done, MD  atorvastatin (LIPITOR) 80 MG tablet TAKE 1 TABLET BY MOUTH  DAILY Patient taking differently: Take 80 mg by mouth daily. 09/23/20  Yes Deboraha Sprang, MD  CALCIUM PO Take 600 mg by mouth 2 (two) times daily.   Yes [provider]  carvedilol (COREG) 25 MG tablet TAKE 2 TABLETS BY MOUTH  TWICE DAILY Patient taking differently: Take 50 mg by mouth 2 (two) times daily with a meal. 09/23/20  Yes Deboraha Sprang, MD  Cholecalciferol (D3 VITAMIN PO) Take 4,000 Units by mouth daily.   Yes [provider]  Coenzyme Q10 (COQ-10 PO) Take 300 mg by mouth daily.   Yes [provider]  Cyanocobalamin (VITAMIN B 12) 500 MCG TABS Take 500 mcg by mouth 3 (three) times a week.   Yes [provider]  dofetilide (TIKOSYN) 500 MCG capsule TAKE 1 CAPSULE BY MOUTH  TWICE DAILY Patient taking differently: Take 500 mcg by mouth 2 (two) times daily. 09/23/20  Yes Deboraha Sprang, MD  ELIQUIS 5 MG TABS tablet TAKE 1 TABLET BY MOUTH  TWICE DAILY Patient taking differently: Take 5 mg by mouth 2 (two) times daily. 09/23/20  Yes Deboraha Sprang, MD  fenofibrate 160 MG tablet TAKE 1 TABLET BY MOUTH  DAILY Patient taking differently: Take 160 mg by mouth daily. 09/23/20  Yes Deboraha Sprang, MD  fexofenadine  (ALLEGRA) 180 MG tablet Take 180 mg by mouth daily.   Yes [provider]  Fluocinonide Emulsified Base 0.05 % CREA APPLY TO AFFECTED AREA(S)  TWO TIMES DAILY AS NEEDED 01/16/19  Yes Biagio Borg, MD  folic acid (FOLVITE) 564 MCG tablet Take 400 mcg by mouth daily.   Yes [provider]  levofloxacin (LEVAQUIN) 500 MG tablet Take 500 mg by mouth daily. 12/12/20  Yes [provider]  losartan (COZAAR) 25 MG tablet TAKE 1 TABLET BY MOUTH  DAILY Patient taking differently: Take 12.5 mg by mouth in the morning and at bedtime. 09/23/20  Yes Deboraha Sprang, MD  Lysine 500 MG TABS Take 500 mg by mouth 3 (three) times daily.   Yes [provider]  Magnesium 250 MG TABS Take 250 mg by mouth 2 (two) times daily.    Yes [provider]  metroNIDAZOLE (FLAGYL) 500 MG tablet Take 500 mg by mouth 2 (two) times daily. 12/12/20  Yes [provider]  potassium chloride (KLOR-CON) 10 MEQ tablet TAKE 1 TABLET BY MOUTH  TWICE DAILY Patient taking differently: Take 10 mEq by mouth daily. 09/23/20  Yes Deboraha Sprang, MD  ranolazine (RANEXA) 500 MG 12 hr tablet Take 1 tablet (500 mg total) by mouth 2 (two) times daily. 06/01/20  Yes Deboraha Sprang, MD  traMADol (ULTRAM) 50 MG tablet 1-2 tablets every 6 hours as needed for pain Patient taking differently: Take 50-100  mg by mouth every 6 (six) hours as needed for moderate pain. 12/11/20  Yes Charlesetta Shanks, MD  ranitidine (ZANTAC) 300 MG tablet Take 1 tablet (300 mg total) by mouth at bedtime. Patient not taking: Reported on 12/17/2020 03/08/17   Jiles Prows, MD     Vital Signs: BP 113/69 (BP Location: Left Arm)   Pulse 74   Temp (!) 97.5 F (36.4 C) (Oral)   Resp (!) 22   Ht 6\' 3"  (1.905 m)   Wt 219 lb 14.4 oz (99.7 kg)   SpO2 92%   BMI 27.49 kg/m   Physical Exam Vitals and nursing note reviewed.  Constitutional:      General: He is not in acute distress.    Appearance: He is well-developed. He is not  ill-appearing.  HENT:     Head: Normocephalic and atraumatic.  Cardiovascular:     Rate and Rhythm: Normal rate.  Pulmonary:     Effort: Pulmonary effort is normal. No respiratory distress.  Abdominal:     General: Abdomen is flat. There is no distension.     Palpations: Abdomen is soft.     Comments: Positive LLQ drain to a gravity bag. Site is unremarkable with no erythema, edema, tenderness, bleeding or drainage. Suture and stat lock in place. Dressing is clean, dry, and intact. Trace of light brown fluid noted in the gravity bag. Drain aspirates and flushes well.   Skin:    General: Skin is warm and dry.  Neurological:     Mental Status: He is alert and oriented to person, place, and time.  Psychiatric:        Mood and Affect: Mood normal.        Behavior: Behavior normal.     Imaging: CT ABDOMEN PELVIS W CONTRAST  Result Date: 12/20/2020 CLINICAL DATA:  Abdominal pain. EXAM: CT ABDOMEN AND PELVIS WITH CONTRAST TECHNIQUE: Multidetector CT imaging of the abdomen and pelvis was performed using the standard protocol following bolus administration of intravenous contrast. CONTRAST:  142mL OMNIPAQUE IOHEXOL 300 MG/ML  SOLN COMPARISON:  December 15, 2020. FINDINGS: Lower chest: No acute abnormality. Hepatobiliary: No gallstones or biliary dilatation is noted. 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 Pancreas: Unremarkable. No pancreatic ductal dilatation or surrounding inflammatory changes. Spleen: Normal in size without focal abnormality. Adrenals/Urinary Tract: Adrenal glands appear normal. Left renal cyst is noted. No hydronephrosis or renal obstruction is noted. No renal or ureteral calculi are noted. Urinary bladder is decompressed. Stomach/Bowel: Mild gastric distention is noted. There is significantly worsened small bowel dilatation concerning for obstruction. There is interval development of 10.9 x 6.6 cm fluid collection in the left lower quadrant of the  pelvis concerning for abscess. Continued findings are noted consistent with sigmoid diverticulitis which appears to have progressed. Vascular/Lymphatic: Aortic atherosclerosis. No enlarged abdominal or pelvic lymph nodes. Reproductive: Prostate is unremarkable. Other: Increased amount of free fluid is noted in the pelvis and around the liver. No definite hernia is noted. Musculoskeletal: No acute or significant osseous findings. IMPRESSION: Interval development of 10.9 x 6.6 cm fluid collection in the left lower quadrant and pelvis consistent with paradiverticular abscess. This is consistent with worsening sigmoid diverticulitis. Also noted is significantly increased small bowel dilatation concerning for distal small bowel obstruction, most likely related to diverticulitis. Increased amount of free fluid is noted in the pelvis as well as in the right upper quadrant around the liver. These results will be called to the ordering clinician or representative  by the Radiologist Assistant, and communication documented in the PACS or zVision Dashboard. Probable hepatic cirrhosis. Aortic Atherosclerosis (ICD10-I70.0). Electronically Signed   By: Marijo Conception M.D.   On: 12/20/2020 16:35   DG CHEST PORT 1 VIEW  Result Date: 12/20/2020 CLINICAL DATA:  Shortness of breath EXAM: PORTABLE CHEST 1 VIEW COMPARISON:  12/15/2020 FINDINGS: Cardiomegaly, vascular congestion. Bibasilar opacities, likely atelectasis. Left pacer remains in place, unchanged. No acute bony abnormality. IMPRESSION: Cardiomegaly with vascular congestion and bibasilar atelectasis. Electronically Signed   By: Rolm Baptise M.D.   On: 12/20/2020 09:24   DG Abd Portable 1V  Result Date: 12/20/2020 CLINICAL DATA:  Distended abdomen EXAM: PORTABLE ABDOMEN - 1 VIEW COMPARISON:  CT abdomen pelvis 12/15/2020 FINDINGS: Diffusely distended large and small bowel loops.  Distended stomach. No abnormal calcifications.  Bilateral hip replacement. IMPRESSION:  Mildly distended large and small bowel loops most consistent with ileus. Electronically Signed   By: Franchot Gallo M.D.   On: 12/20/2020 09:27   CT IMAGE GUIDED DRAINAGE BY PERCUTANEOUS CATHETER  Result Date: 12/21/2020 CLINICAL DATA:  Enlarging left pelvic diverticular abscess EXAM: CT GUIDED DRAINAGE OF PELVIC ABSCESS ANESTHESIA/SEDATION: Intravenous Fentanyl 29mcg and Versed 2mg  were administered as conscious sedation during continuous monitoring of the patient's level of consciousness and physiological / cardiorespiratory status by the radiology RN, with a total moderate sedation time of 13 minutes. PROCEDURE: The procedure, risks, benefits, and alternatives were explained to the patient. Questions regarding the procedure were encouraged and answered. The patient understands and consents to the procedure. Select axial scans through the pelvis were obtained in the left peritoneal collection was localized. An appropriate skin site was determined and marked. The operative field was prepped with chlorhexidinein a sterile fashion, and a sterile drape was applied covering the operative field. A sterile gown and sterile gloves were used for the procedure. Local anesthesia was provided with 1% Lidocaine. Under CT fluoroscopic guidance, 18 gauge percutaneous entry needle advanced into the collection. Purulent material returned. Amplatz guidewire advanced easily, position confirmed on CT. Tract dilated to facilitate placement 12 French pigtail drain catheter, formed centrally within the collection. CT confirms good catheter position. Catheter secured externally 0 Prolene suture and StatLock and placed to gravity drain bag. 20 mL aspirate sent for Gram stain and culture. The patient tolerated the procedure well. COMPLICATIONS: None immediate FINDINGS: Loculated left lower quadrant pelvic peritoneal collection was localized. 12 French pigtail drain catheter placed as above. Purulent aspirate sample sent for Gram stain  and culture. IMPRESSION: Technically successful CT-guided pelvic abscess drain catheter placement. Electronically Signed   By: Lucrezia Europe M.D.   On: 12/21/2020 14:37    Labs:  CBC: Recent Labs    12/19/20 0024 12/20/20 0656 12/21/20 0315 12/22/20 0305  WBC 15.3* 16.1* 16.8* 8.9  HGB 13.3 13.0 13.5 12.0*  HCT 39.5 39.2 40.6 35.6*  PLT 299 288 328 283    COAGS: Recent Labs    12/11/20 1828 12/16/20 1007 12/17/20 2116 12/18/20 0455 12/19/20 0024 12/20/20 0639 12/21/20 0713  INR 1.9*  --   --   --   --   --  1.3*  APTT  --    < > 123* 102* 92* 80*  --    < > = values in this interval not displayed.    BMP: Recent Labs    12/19/20 0024 12/20/20 0656 12/21/20 0315 12/22/20 0305  NA 139 142 139 139  K 4.1 3.8 4.2 4.0  CL 111 112* 110  110  CO2 22 25 18* 22  GLUCOSE 129* 109* 89 85  BUN 27* 18 18 16   CALCIUM 8.7* 9.0 8.9 8.6*  CREATININE 1.29* 1.23 1.09 1.11  GFRNONAA 56* 59* >60 >60    LIVER FUNCTION TESTS: Recent Labs    06/23/20 1044 12/11/20 1828 12/15/20 1614 12/16/20 0200  BILITOT 0.8 1.6* 1.5* 1.1  AST 23 26 44* 30  ALT 18 24 35 26  ALKPHOS 35* 40 83 54  PROT 6.7 6.4* 6.5 5.0*  ALBUMIN 4.1 3.8 3.1* 2.3*    Assessment and Plan:  S/p LLQ diverticular abscess drain placement with Dr. Vernard Gambles on 12/21/20   Patient is doing well, no complaints today.  Trace of brown colored fluid noted in the gravity bag. Patient states that his nurse just flushed his drain and emptied the bag this morning.  Site clean, dry, soft, no tenderness to palpation, no sign of infection. Dressing clean, dry, intact.  Overnight OP 295 mL Culture pending CBC  stable, WBC 8.9 today (was 16.8 yesterday)  Pt afebrile   Continue with flushing TID, output recording q shift and dressing changes as needed. Would consider additional imaging when output is less than 10 ml for 24 hours not including flush material.   Further treatment plan per [facility]/cardiology. Appreciate and  agree with the treatment plan.  IR to follow.    Electronically Signed: Tera Mater, PA-C 12/22/2020, 8:54 AM   I spent a total of 15 Minutes at  the patient's bedside AND on the patient's hospital floor or unit, greater than 50% of which was counseling/coordinating care for LLQ diverticular abscess drain

## 2020-12-22 NOTE — Progress Notes (Signed)
Patient has home CPAP set up at bedside. Patient will place himself on when he is ready.

## 2020-12-22 NOTE — Progress Notes (Signed)
Patient just finishing up with bowel movement in the bathroom with 1 assist upon arrival.  Patient states bm was oatmeal consistency brown/green color.  PA aware in person.  Patient states bowel movement was large.  Drain to left lower abd is clean, patent, and intact with small amount of serosang drainage.  Patient states no pain except slight tenderness to right lower abd.  Bowel sounds are present X 4 quadrants.  Patient is sitting up in bed watching tv.  Meds given and tolerated well.  Lungs CTA bilaterally.  Will continue to follow medical orders.

## 2020-12-22 NOTE — Progress Notes (Signed)
4 Days Post-Op  Subjective: CC: Patient reports since IR drain placement the "ballooning" sensation he was having in his abdomen has resolved. Pain has greatly improved and only having mild pain of his abdomen, more in the suprapubic abdomen and around the drain. He denies any nausea. Is passing flatus and had a large loose bm this am. Reports he overall feels better.   Objective: Vital signs in last 24 hours: Temp:  [97.5 F (36.4 C)-98.8 F (37.1 C)] 97.5 F (36.4 C) (04/13 0745) Pulse Rate:  [60-78] 74 (04/13 0745) Resp:  [14-32] 22 (04/13 0745) BP: (98-118)/(56-69) 113/69 (04/13 0745) SpO2:  [91 %-98 %] 92 % (04/13 0745) Weight:  [99.7 kg] 99.7 kg (04/13 0500) Last BM Date: 12/20/20  Intake/Output from previous day: 04/12 0701 - 04/13 0700 In: 235.4 [I.V.:185.4; IV Piggyback:50] Out: 295 [Drains:295] Intake/Output this shift: No intake/output data recorded.  PE: Gen: Alert, NAD, pleasant Card:irregular Pulm: rate and effort normal on room air LXB:WIOM distension and soft, + BS heard. Mild suprapubic and LLQ TTP, no peritonitis. IR drain in place with bloody purulent fluid in gravity bag.  Psych: A&Ox3  Lab Results:  Recent Labs    12/21/20 0315 12/22/20 0305  WBC 16.8* 8.9  HGB 13.5 12.0*  HCT 40.6 35.6*  PLT 328 283   BMET Recent Labs    12/21/20 0315 12/22/20 0305  NA 139 139  K 4.2 4.0  CL 110 110  CO2 18* 22  GLUCOSE 89 85  BUN 18 16  CREATININE 1.09 1.11  CALCIUM 8.9 8.6*   PT/INR Recent Labs    12/21/20 0713  LABPROT 16.6*  INR 1.3*   CMP     Component Value Date/Time   NA 139 12/22/2020 0305   NA 142 07/08/2019 1455   K 4.0 12/22/2020 0305   CL 110 12/22/2020 0305   CO2 22 12/22/2020 0305   GLUCOSE 85 12/22/2020 0305   BUN 16 12/22/2020 0305   BUN 17 07/08/2019 1455   CREATININE 1.11 12/22/2020 0305   CREATININE 1.18 08/27/2015 1518   CALCIUM 8.6 (L) 12/22/2020 0305   PROT 5.0 (L) 12/16/2020 0200   ALBUMIN 2.3 (L)  12/16/2020 0200   AST 30 12/16/2020 0200   ALT 26 12/16/2020 0200   ALKPHOS 54 12/16/2020 0200   BILITOT 1.1 12/16/2020 0200   GFRNONAA >60 12/22/2020 0305   GFRAA 66 07/08/2019 1455   Lipase     Component Value Date/Time   LIPASE 22 12/15/2020 1614       Studies/Results: CT ABDOMEN PELVIS W CONTRAST  Result Date: 12/20/2020 CLINICAL DATA:  Abdominal pain. EXAM: CT ABDOMEN AND PELVIS WITH CONTRAST TECHNIQUE: Multidetector CT imaging of the abdomen and pelvis was performed using the standard protocol following bolus administration of intravenous contrast. CONTRAST:  184mL OMNIPAQUE IOHEXOL 300 MG/ML  SOLN COMPARISON:  December 15, 2020. FINDINGS: Lower chest: No acute abnormality. Hepatobiliary: No gallstones or biliary dilatation is noted. 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 Pancreas: Unremarkable. No pancreatic ductal dilatation or surrounding inflammatory changes. Spleen: Normal in size without focal abnormality. Adrenals/Urinary Tract: Adrenal glands appear normal. Left renal cyst is noted. No hydronephrosis or renal obstruction is noted. No renal or ureteral calculi are noted. Urinary bladder is decompressed. Stomach/Bowel: Mild gastric distention is noted. There is significantly worsened small bowel dilatation concerning for obstruction. There is interval development of 10.9 x 6.6 cm fluid collection in the left lower quadrant of the pelvis concerning for abscess.  Continued findings are noted consistent with sigmoid diverticulitis which appears to have progressed. Vascular/Lymphatic: Aortic atherosclerosis. No enlarged abdominal or pelvic lymph nodes. Reproductive: Prostate is unremarkable. Other: Increased amount of free fluid is noted in the pelvis and around the liver. No definite hernia is noted. Musculoskeletal: No acute or significant osseous findings. IMPRESSION: Interval development of 10.9 x 6.6 cm fluid collection in the left lower quadrant and pelvis  consistent with paradiverticular abscess. This is consistent with worsening sigmoid diverticulitis. Also noted is significantly increased small bowel dilatation concerning for distal small bowel obstruction, most likely related to diverticulitis. Increased amount of free fluid is noted in the pelvis as well as in the right upper quadrant around the liver. These results will be called to the ordering clinician or representative by the Radiologist Assistant, and communication documented in the PACS or zVision Dashboard. Probable hepatic cirrhosis. Aortic Atherosclerosis (ICD10-I70.0). Electronically Signed   By: Marijo Conception M.D.   On: 12/20/2020 16:35   DG CHEST PORT 1 VIEW  Result Date: 12/20/2020 CLINICAL DATA:  Shortness of breath EXAM: PORTABLE CHEST 1 VIEW COMPARISON:  12/15/2020 FINDINGS: Cardiomegaly, vascular congestion. Bibasilar opacities, likely atelectasis. Left pacer remains in place, unchanged. No acute bony abnormality. IMPRESSION: Cardiomegaly with vascular congestion and bibasilar atelectasis. Electronically Signed   By: Rolm Baptise M.D.   On: 12/20/2020 09:24   DG Abd Portable 1V  Result Date: 12/20/2020 CLINICAL DATA:  Distended abdomen EXAM: PORTABLE ABDOMEN - 1 VIEW COMPARISON:  CT abdomen pelvis 12/15/2020 FINDINGS: Diffusely distended large and small bowel loops.  Distended stomach. No abnormal calcifications.  Bilateral hip replacement. IMPRESSION: Mildly distended large and small bowel loops most consistent with ileus. Electronically Signed   By: Franchot Gallo M.D.   On: 12/20/2020 09:27   CT IMAGE GUIDED DRAINAGE BY PERCUTANEOUS CATHETER  Result Date: 12/21/2020 CLINICAL DATA:  Enlarging left pelvic diverticular abscess EXAM: CT GUIDED DRAINAGE OF PELVIC ABSCESS ANESTHESIA/SEDATION: Intravenous Fentanyl 12mcg and Versed 2mg  were administered as conscious sedation during continuous monitoring of the patient's level of consciousness and physiological / cardiorespiratory status  by the radiology RN, with a total moderate sedation time of 13 minutes. PROCEDURE: The procedure, risks, benefits, and alternatives were explained to the patient. Questions regarding the procedure were encouraged and answered. The patient understands and consents to the procedure. Select axial scans through the pelvis were obtained in the left peritoneal collection was localized. An appropriate skin site was determined and marked. The operative field was prepped with chlorhexidinein a sterile fashion, and a sterile drape was applied covering the operative field. A sterile gown and sterile gloves were used for the procedure. Local anesthesia was provided with 1% Lidocaine. Under CT fluoroscopic guidance, 18 gauge percutaneous entry needle advanced into the collection. Purulent material returned. Amplatz guidewire advanced easily, position confirmed on CT. Tract dilated to facilitate placement 12 French pigtail drain catheter, formed centrally within the collection. CT confirms good catheter position. Catheter secured externally 0 Prolene suture and StatLock and placed to gravity drain bag. 20 mL aspirate sent for Gram stain and culture. The patient tolerated the procedure well. COMPLICATIONS: None immediate FINDINGS: Loculated left lower quadrant pelvic peritoneal collection was localized. 12 French pigtail drain catheter placed as above. Purulent aspirate sample sent for Gram stain and culture. IMPRESSION: Technically successful CT-guided pelvic abscess drain catheter placement. Electronically Signed   By: Lucrezia Europe M.D.   On: 12/21/2020 14:37    Anti-infectives: Anti-infectives (From admission, onward)   Start  Dose/Rate Route Frequency Ordered Stop   12/16/20 0600  piperacillin-tazobactam (ZOSYN) IVPB 3.375 g        3.375 g 12.5 mL/hr over 240 Minutes Intravenous Every 8 hours 12/16/20 0133     12/15/20 2230  piperacillin-tazobactam (ZOSYN) IVPB 3.375 g        3.375 g 100 mL/hr over 30 Minutes  Intravenous  Once 12/15/20 2217 12/16/20 0015       Assessment/Plan Afib on Eliquis(last dose 12/14/20) - hep gtt, tikosyn; afib with RVR s/p [facility] 4/9 Cardiomyopathy, Combined systolic and diastolicCHF, Hx pacemaker placement; LVEF echo 11/2020 40-45% CADwith stent OSA CKD IIIa - Cr 1.75>1.65>1.43>1.23>1.09>1.11, stable/improving  Diverticulitis with perforation and abscess -CT 4/11 showed 10.9 x 6.6 cm fluid collection in the left lower quadrant and pelvis consistent with paradiverticular abscess.  - S/p IR drain placement 4/12. Cx's pending. Gram stain with abundant wbc present, predominantly pmn. There is also rare budding yeast.  - CT 4/11 also showed dilated small bowel concerning for SBO. Patient is passing flatus and had a large BM this am. Denies nausea. His abdomen is with minimal distension and is very soft. Clinically does not appear obstructed. Will do trial of CLD this am.  - Cont IV abx. Discussed with pharmacy about gram stain showing rare budding yeast and adding antifungal coverage. They are reviewing his chart and will reach out with recommendations.  - Hopefully patient will improve with conservative treatment and be able to avoid surgery during admission. We will follow along with you closely.   FEN:CLD, IVF per [facility] ID: [id number]>> WBC normalized from 16.9 > 8.9. Afebrile.  VTE: SCD's, hep gtt  Foley: none   LOS: 7 days    Jillyn Ledger , [facility] 12/22/2020, 9:02 AM Please see Amion for pager number during day hours 7:00am-4:30pm

## 2020-12-22 NOTE — Progress Notes (Signed)
Pharmacy Antibiotic Note  [name] is a 80 y.o. male admitted on 12/15/2020 with intra-abdominal infection.  Pharmacy has been consulted for Zosyn dosing, started 4/6. WBC trending down. Afebrile, SCr stable at 1.1.  4/12 Abscess Cx: pending 4/6 BCx: negative  Plan: Zosyn 3.375G IV q8h to be infused over 4 hours Trend WBC, temp, renal function Follow up with abscess culture  Length of therapy for Zosyn?  Temp (24hrs), Avg:97.8 F (36.6 C), Min:97.5 F (36.4 C), Max:98.5 F (36.9 C)  Recent Labs  Lab 12/15/20 2339 12/16/20 0200 12/18/20 0455 12/19/20 0024 12/20/20 0656 12/21/20 0315 12/22/20 0305  WBC  --    < > 10.9* 15.3* 16.1* 16.8* 8.9  CREATININE  --    < > 1.43* 1.29* 1.23 1.09 1.11  LATICACIDVEN 1.9  --   --   --   --   --   --    < > = values in this interval not displayed.    Estimated Creatinine Clearance: 62.4 mL/min (by C-G formula based on SCr of 1.11 mg/dL).    Allergies  Allergen Reactions  . Amiodarone Other (See Comments)    Peripheral neuropathy resulted  . Niacin Other (See Comments)    Caused an irregular heartbeat  . Ace Inhibitors Cough  . Mometasone Furoate Other (See Comments)    (Nasonex) Causes nasal bleeding and nose bleeds     Thank you for allowing [place] to participate in this patients care. Jens Som, PharmD 12/22/2020 1:47 PM  Please check [email or web address] for unit-specific pharmacy phone numbers.

## 2020-12-22 NOTE — Progress Notes (Signed)
PROGRESS NOTE    [name]   CNO:[id number]  DOB: 05-21-1941  DOA: 12/15/2020 PCP: Biagio Borg, MD   Brief Narrative:  [name] is an 80 year old male Eliquis, chronic combined heart failure, CAD, OSA, CKD 3 who presents to the hospital for abdominal pain.  He was diagnosed with diverticulitis on 4/2 and presented to the hospital on 4/6 for increasing left lower quadrant pain despite taking Augmentin.  He is found to have diverticulitis with microperforation.  He was placed on IV antibiotics and admitted to the hospital.   Subjective: Mild pain in left lower quadrant.  Patient states that he is having loose bowel movements which are brownish-green.    Assessment & Plan:   Principal Problem:   Diverticulitis of colon with micro perforation and abscess  -sepsis -Repeat CT scan on 4/11 revealed a 10 x 7 cm abscess -On 4/11 an IR guided drain was placed and per patient, this has drained large amount of yellowish-green liquid -Continue IV Zosyn WBC count has improved from 16.8-8.9 -Abscess culture is pending  Active Problems: Atrial fibrillation with RVR -Continue Cardizem 30 mg every 6 hours, carvedilol 25 mg twice daily, heparin and Tikosyn -Appreciate cardiology  Chronic systolic and diastolic heart failure -Echo from 3/22 reveals EF of 40 to 45% and grade 2 diastolic heart failure -Continue to follow-appreciate management by cardiology  AKI on CKD stage III -Creatinine on 12/11/2020 was 1.27-creatinine on admission was noted to be 2.3 -Treated with IV fluids and holding losartan -CT imaging unrevealing -Creatinine improved to 1.11     Time spent in minutes: 35 DVT prophylaxis: Heparin infusion  Code Status: Full code Family Communication: Wife and son at bedside Level of Care: Level of care: Progressive Disposition Plan:  Status is: Inpatient  Remains inpatient appropriate because:Hemodynamically unstable and IV treatments appropriate due to intensity  of illness or inability to take PO   Dispo: The patient is from: Home              Anticipated d/c is to: Home              Patient currently is not medically stable to d/c.   Difficult to place patient No      Consultants:   Cardiology  General surgery Procedures:   Left lower quadrant drain Antimicrobials:  Anti-infectives (From admission, onward)   Start     Dose/Rate Route Frequency Ordered Stop   12/16/20 0600  piperacillin-tazobactam (ZOSYN) IVPB 3.375 g        3.375 g 12.5 mL/hr over 240 Minutes Intravenous Every 8 hours 12/16/20 0133     12/15/20 2230  piperacillin-tazobactam (ZOSYN) IVPB 3.375 g        3.375 g 100 mL/hr over 30 Minutes Intravenous  Once 12/15/20 2217 12/16/20 0015       Objective: Vitals:   12/22/20 0500 12/22/20 0745 12/22/20 1330 12/22/20 1600  BP:  113/69 111/61 118/66  Pulse:  74 70 98  Resp:  (!) 22 20 (!) 22  Temp:  (!) 97.5 F (36.4 C) 97.6 F (36.4 C) 97.7 F (36.5 C)  TempSrc:  Oral Oral Oral  SpO2:  92% 93% 93%  Weight: 99.7 kg     Height:        Intake/Output Summary (Last 24 hours) at 12/22/2020 1805 Last data filed at 12/22/2020 1331 Gross per 24 hour  Intake 495.42 ml  Output 1230 ml  Net -734.58 ml   Filed Weights   12/19/20  0500 12/21/20 0450 12/22/20 0500  Weight: 100 kg 102.4 kg 99.7 kg    Examination: General exam: Appears comfortable  HEENT: PERRLA, oral mucosa moist, no sclera icterus or thrush Respiratory system: Clear to auscultation. Respiratory effort normal. Cardiovascular system: S1 & S2 heard, IIRR.   Gastrointestinal system: Abdomen soft, mildly tender around the left lower quadrant-drain is present with the yellow-green fluid in it-nondistended. Normal bowel sounds. Central nervous system: Alert and oriented. No focal neurological deficits. Extremities: No cyanosis, clubbing or edema Skin: No rashes or ulcers Psychiatry:  Mood & affect appropriate.     Data Reviewed: I have personally  reviewed following labs and imaging studies  CBC: Recent Labs  Lab 12/18/20 0455 12/19/20 0024 12/20/20 0656 12/21/20 0315 12/22/20 0305  WBC 10.9* 15.3* 16.1* 16.8* 8.9  HGB 12.7* 13.3 13.0 13.5 12.0*  HCT 37.4* 39.5 39.2 40.6 35.6*  MCV 96.1 96.6 99.2 101.2* 98.9  PLT 262 299 288 328 284   Basic Metabolic Panel: Recent Labs  Lab 12/16/20 1007 12/17/20 0732 12/18/20 0455 12/19/20 0024 12/20/20 0656 12/21/20 0315 12/22/20 0305  NA 135 138 139 139 142 139 139  K 4.3 4.0 3.5 4.1 3.8 4.2 4.0  CL 106 106 110 111 112* 110 110  CO2 19* 21* 23 22 25  18* 22  GLUCOSE 102* 102* 115* 129* 109* 89 85  BUN 52* 46* 36* 27* 18 18 16   CREATININE 1.75* 1.65* 1.43* 1.29* 1.23 1.09 1.11  CALCIUM 9.5 9.5 8.8* 8.7* 9.0 8.9 8.6*  MG 1.9 2.1 2.0 2.1  --   --   --    GFR: Estimated Creatinine Clearance: 62.4 mL/min (by C-G formula based on SCr of 1.11 mg/dL). Liver Function Tests: Recent Labs  Lab 12/16/20 0200  AST 30  ALT 26  ALKPHOS 54  BILITOT 1.1  PROT 5.0*  ALBUMIN 2.3*   No results for input(s): LIPASE, AMYLASE in the last 168 hours. No results for input(s): AMMONIA in the last 168 hours. Coagulation Profile: Recent Labs  Lab 12/21/20 0713  INR 1.3*   Cardiac Enzymes: Recent Labs  Lab 12/15/20 2225  CKTOTAL 97   BNP (last 3 results) No results for input(s): PROBNP in the last 8760 hours. HbA1C: No results for input(s): HGBA1C in the last 72 hours. CBG: No results for input(s): GLUCAP in the last 168 hours. Lipid Profile: No results for input(s): CHOL, HDL, LDLCALC, TRIG, CHOLHDL, LDLDIRECT in the last 72 hours. Thyroid Function Tests: No results for input(s): TSH, T4TOTAL, FREET4, T3FREE, THYROIDAB in the last 72 hours. Anemia Panel: No results for input(s): VITAMINB12, FOLATE, FERRITIN, TIBC, IRON, RETICCTPCT in the last 72 hours. Urine analysis:    Component Value Date/Time   COLORURINE AMBER (A) 12/15/2020 1614   APPEARANCEUR HAZY (A) 12/15/2020 1614    LABSPEC 1.024 12/15/2020 1614   PHURINE 5.0 12/15/2020 1614   GLUCOSEU NEGATIVE 12/15/2020 1614   GLUCOSEU NEGATIVE 06/23/2020 1044   HGBUR NEGATIVE 12/15/2020 1614   BILIRUBINUR NEGATIVE 12/15/2020 1614   KETONESUR 5 (A) 12/15/2020 1614   PROTEINUR NEGATIVE 12/15/2020 1614   UROBILINOGEN 1.0 06/23/2020 1044   NITRITE NEGATIVE 12/15/2020 1614   LEUKOCYTESUR TRACE (A) 12/15/2020 1614   Sepsis Labs: @LABRCNTIP (procalcitonin:4,lacticidven:4) ) Recent Results (from the past 240 hour(s))  Resp Panel by RT-PCR (Flu A&B, Covid) Nasopharyngeal Swab     Status: None   Collection Time: 12/15/20 10:21 PM   Specimen: Nasopharyngeal Swab; Nasopharyngeal(NP) swabs in vial transport medium  Result Value Ref Range Status  SARS Coronavirus 2 by RT PCR NEGATIVE NEGATIVE Final    Comment: (NOTE) SARS-CoV-2 target nucleic acids are NOT DETECTED.  The SARS-CoV-2 RNA is generally detectable in upper respiratory specimens during the acute phase of infection. The lowest concentration of SARS-CoV-2 viral copies this assay can detect is 138 copies/mL. A negative result does not preclude SARS-Cov-2 infection and should not be used as the sole basis for treatment or other patient management decisions. A negative result may occur with  improper specimen collection/handling, submission of specimen other than nasopharyngeal swab, presence of viral mutation(s) within the areas targeted by this assay, and inadequate number of viral copies(<138 copies/mL). A negative result must be combined with clinical observations, patient history, and epidemiological information. The expected result is Negative.  Fact Sheet for Patients:  EntrepreneurPulse.com.au  Fact Sheet for Healthcare Providers:  IncredibleEmployment.be  This test is no t yet approved or cleared by the [place] FDA and  has been authorized for detection and/or diagnosis of SARS-CoV-2 by FDA under an  Emergency Use Authorization (EUA). This EUA will remain  in effect (meaning this test can be used) for the duration of the COVID-19 declaration under Section 564(b)(1) of the Act, 21 U.S.C.section 360bbb-3(b)(1), unless the authorization is terminated  or revoked sooner.       Influenza A by PCR NEGATIVE NEGATIVE Final   Influenza B by PCR NEGATIVE NEGATIVE Final    Comment: (NOTE) The Xpert Xpress SARS-CoV-2/FLU/RSV plus assay is intended as an aid in the diagnosis of influenza from Nasopharyngeal swab specimens and should not be used as a sole basis for treatment. Nasal washings and aspirates are unacceptable for Xpert Xpress SARS-CoV-2/FLU/RSV testing.  Fact Sheet for Patients: EntrepreneurPulse.com.au  Fact Sheet for Healthcare Providers: IncredibleEmployment.be  This test is not yet approved or cleared by the [place] FDA and has been authorized for detection and/or diagnosis of SARS-CoV-2 by FDA under an Emergency Use Authorization (EUA). This EUA will remain in effect (meaning this test can be used) for the duration of the COVID-19 declaration under Section 564(b)(1) of the Act, 21 U.S.C. section 360bbb-3(b)(1), unless the authorization is terminated or revoked.  Performed at [facility], [place] [address] [place], [place] [address]   Blood culture (routine x 2)     Status: None   Collection Time: 12/15/20 10:34 PM   Specimen: BLOOD  Result Value Ref Range Status   Specimen Description BLOOD RIGHT FOREARM  Final   Special Requests   Final    BOTTLES DRAWN AEROBIC AND ANAEROBIC Blood Culture results may not be optimal due to an inadequate volume of blood received in culture bottles   Culture   Final    NO GROWTH 5 DAYS Performed at [facility], [place] [address], [place], [place] [address]    Report Status 12/21/2020 FINAL  Final  Blood culture (routine x 2)     Status: None   Collection Time: 12/15/20 11:32 PM    Specimen: BLOOD  Result Value Ref Range Status   Specimen Description BLOOD RIGHT ARM  Final   Special Requests   Final    BOTTLES DRAWN AEROBIC AND ANAEROBIC Blood Culture adequate volume   Culture   Final    NO GROWTH 5 DAYS Performed at [facility], [address] [place], [place] [address]    Report Status 12/21/2020 FINAL  Final  MRSA PCR Screening     Status: None   Collection Time: 12/18/20  4:45 AM  Specimen: Nasal Mucosa; Nasopharyngeal  Result Value Ref Range Status   MRSA by PCR NEGATIVE NEGATIVE Final    Comment:        The GeneXpert MRSA Assay (FDA approved for NASAL specimens only), is one component of a comprehensive MRSA colonization surveillance program. It is not intended to diagnose MRSA infection nor to guide or monitor treatment for MRSA infections. Performed at [facility], [place] [address], [place], [place] [address]   Aerobic/Anaerobic Culture (surgical/deep wound)     Status: None (Preliminary result)   Collection Time: 12/21/20 10:38 AM   Specimen: Abscess  Result Value Ref Range Status   Specimen Description ABSCESS DRAIN  Final   Special Requests Normal  Final   Gram Stain   Final    ABUNDANT WBC PRESENT, PREDOMINANTLY PMN RARE BUDDING YEAST SEEN    Culture   Final    CULTURE REINCUBATED FOR BETTER GROWTH Performed at [facility], [place] [address] [place], [place] [address]    Report Status PENDING  Incomplete         Radiology Studies: CT IMAGE GUIDED DRAINAGE BY PERCUTANEOUS CATHETER  Result Date: 12/21/2020 CLINICAL DATA:  Enlarging left pelvic diverticular abscess EXAM: CT GUIDED DRAINAGE OF PELVIC ABSCESS ANESTHESIA/SEDATION: Intravenous Fentanyl 32mcg and Versed 2mg  were administered as conscious sedation during continuous monitoring of the patient's level of consciousness and physiological / cardiorespiratory status by the radiology RN, with a total moderate sedation time of 13 minutes. PROCEDURE: The procedure,  risks, benefits, and alternatives were explained to the patient. Questions regarding the procedure were encouraged and answered. The patient understands and consents to the procedure. Select axial scans through the pelvis were obtained in the left peritoneal collection was localized. An appropriate skin site was determined and marked. The operative field was prepped with chlorhexidinein a sterile fashion, and a sterile drape was applied covering the operative field. A sterile gown and sterile gloves were used for the procedure. Local anesthesia was provided with 1% Lidocaine. Under CT fluoroscopic guidance, 18 gauge percutaneous entry needle advanced into the collection. Purulent material returned. Amplatz guidewire advanced easily, position confirmed on CT. Tract dilated to facilitate placement 12 French pigtail drain catheter, formed centrally within the collection. CT confirms good catheter position. Catheter secured externally 0 Prolene suture and StatLock and placed to gravity drain bag. 20 mL aspirate sent for Gram stain and culture. The patient tolerated the procedure well. COMPLICATIONS: None immediate FINDINGS: Loculated left lower quadrant pelvic peritoneal collection was localized. 12 French pigtail drain catheter placed as above. Purulent aspirate sample sent for Gram stain and culture. IMPRESSION: Technically successful CT-guided pelvic abscess drain catheter placement. Electronically Signed   By: Lucrezia Europe M.D.   On: 12/21/2020 14:37      Scheduled Meds: . carvedilol  25 mg Oral BID WC  . Chlorhexidine Gluconate Cloth  6 each Topical Daily  . diltiazem  30 mg Oral Q6H  . dofetilide  500 mcg Oral BID  . feeding supplement  237 mL Oral TID BM  . melatonin  3 mg Oral QHS  . pantoprazole (PROTONIX) IV  40 mg Intravenous Daily  . potassium chloride  20 mEq Oral Daily  . sodium chloride flush  5 mL Intracatheter Q8H   Continuous Infusions: . heparin 2,000 Units/hr (12/22/20 1548)  .  norepinephrine (LEVOPHED) Adult infusion Stopped (12/18/20 1648)  . piperacillin-tazobactam (ZOSYN)  IV 3.375 g (12/22/20 1551)     LOS: 7 days  Debbe Odea, MD [facility] Pager: [email or web address] 12/22/2020, 6:05 PM

## 2020-12-23 DIAGNOSIS — I5042 Chronic combined systolic (congestive) and diastolic (congestive) heart failure: Secondary | ICD-10-CM | POA: Diagnosis not present

## 2020-12-23 DIAGNOSIS — I480 Paroxysmal atrial fibrillation: Secondary | ICD-10-CM | POA: Diagnosis not present

## 2020-12-23 DIAGNOSIS — K5720 Diverticulitis of large intestine with perforation and abscess without bleeding: Secondary | ICD-10-CM | POA: Diagnosis not present

## 2020-12-23 LAB — CBC
HCT: 37.1 % — ABNORMAL LOW (ref 39.0–52.0)
Hemoglobin: 12.3 g/dL — ABNORMAL LOW (ref 13.0–17.0)
MCH: 32.8 pg (ref 26.0–34.0)
MCHC: 33.2 g/dL (ref 30.0–36.0)
MCV: 98.9 fL (ref 80.0–100.0)
Platelets: 322 10*3/uL (ref 150–400)
RBC: 3.75 MIL/uL — ABNORMAL LOW (ref 4.22–5.81)
RDW: 15.5 % (ref 11.5–15.5)
WBC: 8.4 10*3/uL (ref 4.0–10.5)
nRBC: 0 % (ref 0.0–0.2)

## 2020-12-23 LAB — BASIC METABOLIC PANEL
Anion gap: 6 (ref 5–15)
BUN: 12 mg/dL (ref 8–23)
CO2: 25 mmol/L (ref 22–32)
Calcium: 8.5 mg/dL — ABNORMAL LOW (ref 8.9–10.3)
Chloride: 108 mmol/L (ref 98–111)
Creatinine, Ser: 1.11 mg/dL (ref 0.61–1.24)
GFR, Estimated: >60 mL/min (ref >60)
Glucose, Bld: 92 mg/dL (ref 70–99)
Potassium: 3.7 mmol/L (ref 3.5–5.1)
Sodium: 139 mmol/L (ref 135–145)

## 2020-12-23 LAB — HEPARIN LEVEL (UNFRACTIONATED): Heparin Unfractionated: 0.37 IU/mL (ref 0.30–0.70)

## 2020-12-23 MED ORDER — LOSARTAN POTASSIUM 25 MG PO TABS
25.0000 mg | ORAL_TABLET | Freq: Every day | ORAL | Status: DC
  Administered 2020-12-23 – 2020-12-25 (×3): 25 mg via ORAL
  Filled 2020-12-23 (×3): qty 1

## 2020-12-23 MED ORDER — BOOST PLUS PO LIQD
237.0000 mL | Freq: Three times a day (TID) | ORAL | Status: DC
  Administered 2020-12-23 – 2020-12-24 (×4): 237 mL via ORAL
  Filled 2020-12-23 (×7): qty 237

## 2020-12-23 MED ORDER — DILTIAZEM HCL ER COATED BEADS 120 MG PO CP24: 120.0000 mg | ORAL_CAPSULE | Freq: Every day | ORAL | Status: DC

## 2020-12-23 NOTE — Progress Notes (Addendum)
Progress Note  Patient Name: [name] Date of Encounter: 12/23/2020  [facility] Cardiologist: Evalina Field, MD   Subjective   Denies any chest pain or dyspnea.   Inpatient Medications    Scheduled Meds: . carvedilol  25 mg Oral BID WC  . Chlorhexidine Gluconate Cloth  6 each Topical Daily  . diltiazem  120 mg Oral Daily  . dofetilide  500 mcg Oral BID  . feeding supplement  237 mL Oral TID BM  . melatonin  3 mg Oral QHS  . pantoprazole (PROTONIX) IV  40 mg Intravenous Daily  . potassium chloride  20 mEq Oral Daily  . sodium chloride flush  5 mL Intracatheter Q8H   Continuous Infusions: . heparin 2,100 Units/hr (12/23/20 0459)  . piperacillin-tazobactam (ZOSYN)  IV 3.375 g (12/23/20 0505)   PRN Meds: acetaminophen **OR** acetaminophen, alum & mag hydroxide-simeth, HYDROmorphone (DILAUDID) injection, metoprolol tartrate   Vital Signs    Vitals:   12/22/20 2315 12/23/20 0258 12/23/20 0455 12/23/20 0809  BP: 101/76  115/64 112/63  Pulse: 71   83  Resp: 20 (!) 23 12 20   Temp: (!) 97.5 F (36.4 C)  98.3 F (36.8 C) 98.1 F (36.7 C)  TempSrc: Oral  Oral Oral  SpO2: 92%  93% 93%  Weight:  98.6 kg    Height:        Intake/Output Summary (Last 24 hours) at 12/23/2020 1054 Last data filed at 12/23/2020 0500 Gross per 24 hour  Intake --  Output 820 ml  Net -820 ml   Last 3 Weights 12/23/2020 12/22/2020 12/21/2020  Weight (lbs) 217 lb 6.4 oz 219 lb 14.4 oz 225 lb 12 oz  Weight (kg) 98.612 kg 99.746 kg 102.4 kg      Telemetry    Currently in A paced rhythm at rate 60, intermittent Afib with rates up to 110- Personally Reviewed  ECG    No new EKG- Personally Reviewed  Physical Exam   GEN: No acute distress.   Neck: No JVD Cardiac:  RRR, no murmurs, rubs, or gallops.  Respiratory: Clear to auscultation bilaterally. GI:  Distention improved MS: No edema Neuro:  Nonfocal  Psych: Normal affect   Labs    High Sensitivity Troponin:  No results  for input(s): TROPONINIHS in the last 720 hours.    Chemistry Recent Labs  Lab 12/21/20 0315 12/22/20 0305 12/23/20 0132  NA 139 139 139  K 4.2 4.0 3.7  CL 110 110 108  CO2 18* 22 25  GLUCOSE 89 85 92  BUN 18 16 12   CREATININE 1.09 1.11 1.11  CALCIUM 8.9 8.6* 8.5*  GFRNONAA >60 >60 >60  ANIONGAP 11 7 6      Hematology Recent Labs  Lab 12/21/20 0315 12/22/20 0305 12/23/20 0132  WBC 16.8* 8.9 8.4  RBC 4.01* 3.60* 3.75*  HGB 13.5 12.0* 12.3*  HCT 40.6 35.6* 37.1*  MCV 101.2* 98.9 98.9  MCH 33.7 33.3 32.8  MCHC 33.3 33.7 33.2  RDW 15.8* 15.5 15.5  PLT 328 283 322    BNPNo results for input(s): BNP, PROBNP in the last 168 hours.   DDimer No results for input(s): DDIMER in the last 168 hours.   Radiology    No results found.  Cardiac Studies   Echo 11/24/20: 1. Left ventricular ejection fraction, by estimation, is 40 to 45%. The  left ventricle has mildly decreased function. The left ventricle  demonstrates regional wall motion abnormalities. Abnormal (paradoxical)  septal motion, consistent  with left bundle  branch block. The left ventricular internal cavity size was mildly  dilated. Left ventricular diastolic parameters are consistent with Grade  II diastolic dysfunction (pseudonormalization). Elevated left atrial  pressure.  2. Right ventricular systolic function is normal. The right ventricular  size is moderately enlarged. There is normal pulmonary artery systolic  pressure. The estimated right ventricular systolic pressure is 24.8 mmHg.  3. Left atrial size was severely dilated.  4. Right atrial size was mildly dilated.  5. The mitral valve is normal in structure. Mild mitral valve  regurgitation.  6. The aortic valve is tricuspid. Aortic valve regurgitation is not  visualized. Mild to moderate aortic valve sclerosis/calcification is  present, without any evidence of aortic stenosis.  7. The inferior vena cava is normal in size with greater than  50%  respiratory variability, suggesting right atrial pressure of 3 mmHg.   Patient Profile     80 y.o. 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  Transferred to [place] on 4/9 due to hypotension and A. fib with RVR, underwent urgent cardioversion  Assessment & Plan    Paroxysmal atrial fibrillation -Having paroxysms of atrial fibrillation, currently rate controlled.  Required urgent cardioversion for AF with RVR and hypotension on 4/9 -EP is recommended to continue Tikosyn.  We will continue per their recommendation.   -On heparin drip.Transition to [place] once surgical procedures will not be planned. -Continue coreg 25 mg twice daily.  Rates appear better controlled, will discontinue diltiazem as would prefer not to stay on diltiazem due to systolic heart failure  Chronic combined systolic and diastolic heart failure, EF 40-45% -Held ACE in setting of AKI, can fold back now that AKI resolved -Continue Coreg.  Diverticulitis with perforation -Surgery following, on IV antibiotics.  IR drained pelvic abscess 4/12   For questions or updates, please contact [facility] Please consult [email or web address] for contact info under        Signed, Donato Heinz, MD  12/23/2020, 10:54 AM

## 2020-12-23 NOTE — Progress Notes (Signed)
Mobility Specialist - Progress Note   12/23/20 1309  Mobility  Activity Ambulated in hall  Level of Assistance Independent  Assistive Device None  Distance Ambulated (ft) 260 ft  Mobility Response Tolerated well  Mobility performed by Mobility specialist  $Mobility charge 1 Mobility   Pt asx throughout ambulation. Pt back in recliner after walk. VSS throughout.   Pricilla Handler Mobility Specialist Mobility Specialist Phone: [phone]

## 2020-12-23 NOTE — Progress Notes (Signed)
5 Days Post-Op  Subjective: CC: + flatus, no BM since yesterday, feels less bloated.  No nausea.  Drinking CLD.  Ambulating once a day.  Ready to go home. Can't get good sleep in hospital  Objective: Vital signs in last 24 hours: Temp:  [97.5 F (36.4 C)-98.3 F (36.8 C)] 98.3 F (36.8 C) (04/14 0455) Pulse Rate:  [70-98] 71 (04/13 2315) Resp:  [12-23] 12 (04/14 0455) BP: (101-118)/(53-76) 115/64 (04/14 0455) SpO2:  [92 %-96 %] 93 % (04/14 0455) Weight:  [98.6 kg] 98.6 kg (04/14 0258) Last BM Date: 12/22/20  Intake/Output from previous day: 04/13 0701 - 04/14 0700 In: 360 [P.O.:360] Out: 1220 [Urine:1200; Drains:20] Intake/Output this shift: No intake/output data recorded.  PE: Gen: Alert, NAD, pleasant Card:irregular Pulm: rate and effort normal on room air XBW:IOMB distension and soft, + BS heard.  LLQ TTP around drain, no peritonitis. IR drain in place with no output currently, except scant serous fluid Psych: A&Ox3  Lab Results:  Recent Labs    12/22/20 0305 12/23/20 0132  WBC 8.9 8.4  HGB 12.0* 12.3*  HCT 35.6* 37.1*  PLT 283 322   BMET Recent Labs    12/22/20 0305 12/23/20 0132  NA 139 139  K 4.0 3.7  CL 110 108  CO2 22 25  GLUCOSE 85 92  BUN 16 12  CREATININE 1.11 1.11  CALCIUM 8.6* 8.5*   PT/INR Recent Labs    12/21/20 0713  LABPROT 16.6*  INR 1.3*   CMP     Component Value Date/Time   NA 139 12/23/2020 0132   NA 142 07/08/2019 1455   K 3.7 12/23/2020 0132   CL 108 12/23/2020 0132   CO2 25 12/23/2020 0132   GLUCOSE 92 12/23/2020 0132   BUN 12 12/23/2020 0132   BUN 17 07/08/2019 1455   CREATININE 1.11 12/23/2020 0132   CREATININE 1.18 08/27/2015 1518   CALCIUM 8.5 (L) 12/23/2020 0132   PROT 5.0 (L) 12/16/2020 0200   ALBUMIN 2.3 (L) 12/16/2020 0200   AST 30 12/16/2020 0200   ALT 26 12/16/2020 0200   ALKPHOS 54 12/16/2020 0200   BILITOT 1.1 12/16/2020 0200   GFRNONAA >60 12/23/2020 0132   GFRAA 66 07/08/2019 1455    Lipase     Component Value Date/Time   LIPASE 22 12/15/2020 1614       Studies/Results: CT IMAGE GUIDED DRAINAGE BY PERCUTANEOUS CATHETER  Result Date: 12/21/2020 CLINICAL DATA:  Enlarging left pelvic diverticular abscess EXAM: CT GUIDED DRAINAGE OF PELVIC ABSCESS ANESTHESIA/SEDATION: Intravenous Fentanyl 52mcg and Versed 2mg  were administered as conscious sedation during continuous monitoring of the patient's level of consciousness and physiological / cardiorespiratory status by the radiology RN, with a total moderate sedation time of 13 minutes. PROCEDURE: The procedure, risks, benefits, and alternatives were explained to the patient. Questions regarding the procedure were encouraged and answered. The patient understands and consents to the procedure. Select axial scans through the pelvis were obtained in the left peritoneal collection was localized. An appropriate skin site was determined and marked. The operative field was prepped with chlorhexidinein a sterile fashion, and a sterile drape was applied covering the operative field. A sterile gown and sterile gloves were used for the procedure. Local anesthesia was provided with 1% Lidocaine. Under CT fluoroscopic guidance, 18 gauge percutaneous entry needle advanced into the collection. Purulent material returned. Amplatz guidewire advanced easily, position confirmed on CT. Tract dilated to facilitate placement 12 French pigtail drain catheter, formed centrally within the  collection. CT confirms good catheter position. Catheter secured externally 0 Prolene suture and StatLock and placed to gravity drain bag. 20 mL aspirate sent for Gram stain and culture. The patient tolerated the procedure well. COMPLICATIONS: None immediate FINDINGS: Loculated left lower quadrant pelvic peritoneal collection was localized. 12 French pigtail drain catheter placed as above. Purulent aspirate sample sent for Gram stain and culture. IMPRESSION: Technically  successful CT-guided pelvic abscess drain catheter placement. Electronically Signed   By: Lucrezia Europe M.D.   On: 12/21/2020 14:37    Anti-infectives: Anti-infectives (From admission, onward)   Start     Dose/Rate Route Frequency Ordered Stop   12/16/20 0600  piperacillin-tazobactam (ZOSYN) IVPB 3.375 g        3.375 g 12.5 mL/hr over 240 Minutes Intravenous Every 8 hours 12/16/20 0133     12/15/20 2230  piperacillin-tazobactam (ZOSYN) IVPB 3.375 g        3.375 g 100 mL/hr over 30 Minutes Intravenous  Once 12/15/20 2217 12/16/20 0015       Assessment/Plan Afib on Eliquis(last dose 12/14/20) - hep gtt, tikosyn; afib with RVR s/p [facility] 4/9 Cardiomyopathy, Combined systolic and diastolicCHF, Hx pacemaker placement; LVEF echo 11/2020 40-45% CADwith stent OSA CKD IIIa - Cr 1.75>1.65>1.43>1.23>1.09>1.11, stable/improving  Diverticulitis with perforation and abscess -CT 4/11 showed 10.9 x 6.6 cm fluid collection in the left lower quadrant and pelvis consistent with paradiverticular abscess.  - S/p IR drain placement 4/12. Cx's pending. Gram stain with abundant wbc present, predominantly pmn. There is also rare budding yeast.  - CT 4/11 also showed dilated small bowel concerning for SBO. Patient is tolerating some CLD.  Will try FLD today as he continues to pass flatus and feels less bloated and is tolerating CLD - Cont IV abx. CX with rare budding yeast.  Awaiting final culture to determine treatment plan.  WBC normal - Hopefully patient will improve with conservative treatment and be able to avoid surgery during admission. We will follow along with you closely.   FEN:FLD, IVF per TR[facility] ID: [id number]>> VTE: SCD's, hep gtt  Foley: none   LOS: 8 days    Henreitta Cea , [facility] Surgery 12/23/2020, 8:02 AM Please see Amion for pager number during day hours 7:00am-4:30pm

## 2020-12-23 NOTE — Progress Notes (Addendum)
Initial Nutrition Assessment  DOCUMENTATION CODES:   Severe malnutrition in context of acute illness/injury  INTERVENTION:   Recommend initiation of TPN if unable to advance diet and tolerate in the next 24-48 hrs.    Change to Boost Plus chocolate TID- Each supplement provides 360kcal and 14g protein.     Magic cup TID with meals, each supplement provides 290 kcal and 9 grams of protein  MVI daily   NUTRITION DIAGNOSIS:   Severe Malnutrition related to acute illness (diverticulitis w/ abscess) as evidenced by energy intake < or equal to 50% for > or equal to 5 days,severe muscle depletion,moderate fat depletion.  GOAL:   Patient will meet greater than or equal to 90% of their needs  MONITOR:   PO intake,Supplement acceptance,Weight trends,Labs,I & O's  REASON FOR ASSESSMENT:   NPO/Clear Liquid Diet    ASSESSMENT:   Patient with PMH significant for CHF, occasional dysphagia, HLD, CAD s/p stenting, OSA, and CKD III. Presents this admission with diverticulitis with abscess and perforation.   4/12- s/p IR drain for colonic diverticular abscess  Patient endorses decreased intake over the last week due to off/on abdominal pain. During this time he consumed liquids and ham sandwiches. Prior to this his intake was normal consuming three meals daily. During this admission diet has been mostly limited to liquids. He had four meals on a soft diet on 4/10 but was placed back on liquid diet after. CT scan from 4/11 suggestive of SBO. Has not had significant bowel movement since admit. Given malnutrition status and inadequate intake recommend TPN if unable to advance diet. Will change Ensure to Boost for the time being (patient does not like Ensure).   Patient endorses a UBW of 215-220 lb and is unsure of recent weigh loss. Records indicate patients weight has trended up over the past four months. Suspect fluid accumulation is playing a role.   UOP: 1200 ml x 24 hrs  LLQ drain: 20 ml  x 24 hrs   Medications: 20 mEq KCl daily Labs: reviewed   NUTRITION - FOCUSED PHYSICAL EXAM:  Flowsheet Row Most Recent Value  Orbital Region Mild depletion  Upper Arm Region Moderate depletion  Thoracic and Lumbar Region Unable to assess  Buccal Region Moderate depletion  Temple Region Severe depletion  Clavicle Bone Region Severe depletion  Clavicle and Acromion Bone Region Severe depletion  Scapular Bone Region Unable to assess  Dorsal Hand Mild depletion  Patellar Region Moderate depletion  Anterior Thigh Region Moderate depletion  Posterior Calf Region Moderate depletion  Edema (RD Assessment) Severe  [abdomen]  Hair Reviewed  Eyes Reviewed  Mouth Reviewed  Skin Reviewed  Nails Reviewed     Diet Order:   Diet Order            Diet full liquid Room service appropriate? Yes; Fluid consistency: Thin  Diet effective now                 EDUCATION NEEDS:   Education needs have been addressed  Skin:  Skin Assessment: Reviewed RN Assessment  Last BM:  4/13- liquid smal amount  Height:   Ht Readings from Last 1 Encounters:  12/16/20 6\' 3"  (1.905 m)    Weight:   Wt Readings from Last 1 Encounters:  12/23/20 98.6 kg   BMI:  Body mass index is 27.17 kg/m.  Estimated Nutritional Needs:   Kcal:  2300-2500 kcal  Protein:  115-130 grams  Fluid:  >/= 2 L/day  Mariana Single RD,  LDN Clinical Nutrition Pager listed in [place]

## 2020-12-23 NOTE — Progress Notes (Signed)
PROGRESS NOTE    ZAYAN DELVECCHIO   FWY:[id number]  DOB: 02/22/1941  DOA: 12/15/2020 PCP: Biagio Borg, MD   Brief Narrative:  [name] is an 80 year old male with A-fib on Eliquis, chronic combined heart failure, CAD, OSA, CKD 3 who presents to the hospital for abdominal pain.  He was diagnosed with diverticulitis on 4/2 and presented to the hospital on 4/6 for increasing left lower quadrant pain despite taking Augmentin.  He is found to have diverticulitis with microperforation.  He was placed on IV antibiotics and admitted to the hospital.   Subjective: He continues to improved. Has mild right lower abdominal pain left. BMs are now a little more formed.    Assessment & Plan:   Principal Problem:   Diverticulitis of colon with micro perforation and abscess  -sepsis -Repeat CT scan on 4/11 revealed a 10 x 7 cm abscess -On 4/11 an IR guided drain was placed and per patient, this has drained large amount of yellowish-green liquid -Continue IV Zosyn WBC count improved from 16.8 on 4/12 to -8.9 on 4/13 -Abscess culture > abundant gr neg rods- no anaerobes  - being advanced to full liquids per gen surgery today  Active Problems: Atrial fibrillation with RVR- paroxysmal  -Continue carvedilol 25 mg twice daily, heparin and Tikosyn - Cardiology has d/c'd Oral Cardizem today - will continue IV Heparin until surgery lets [place] know when to switch back to DOAC- d/w surgery today that we are waiting on them to make this decision  Chronic systolic and diastolic heart failure -Echo from 3/22 reveals EF of 40 to 45% and grade 2 diastolic heart failure -Continue to follow-appreciate management by cardiology  AKI on CKD stage III -Creatinine on 12/11/2020 was 1.27-creatinine on admission was noted to be 2.3 -Treated with IV fluids and with holding losartan -CT imaging unrevealing -Creatinine improved to 1.11  OSA - cont CPAP    Time spent in minutes: 35 DVT prophylaxis: Heparin  infusion Code Status: Full code Family Communication: Wife and son at bedside Level of Care: Level of care: Progressive Disposition Plan:  Status is: Inpatient  Remains inpatient appropriate because:IV treatments appropriate due to intensity of illness or inability to take PO   Dispo: The patient is from: Home              Anticipated d/c is to: Home              Patient currently is not medically stable to d/c.   Difficult to place patient No      Consultants:   Cardiology  General surgery Procedures:   Left lower quadrant drain Antimicrobials:  Anti-infectives (From admission, onward)   Start     Dose/Rate Route Frequency Ordered Stop   12/16/20 0600  piperacillin-tazobactam (ZOSYN) IVPB 3.375 g        3.375 g 12.5 mL/hr over 240 Minutes Intravenous Every 8 hours 12/16/20 0133     12/15/20 2230  piperacillin-tazobactam (ZOSYN) IVPB 3.375 g        3.375 g 100 mL/hr over 30 Minutes Intravenous  Once 12/15/20 2217 12/16/20 0015       Objective: Vitals:   12/23/20 0258 12/23/20 0455 12/23/20 0809 12/23/20 1334  BP:  115/64 112/63 (!) 111/47  Pulse:   83 79  Resp: (!) 23 12 20 20   Temp:  98.3 F (36.8 C) 98.1 F (36.7 C) 98 F (36.7 C)  TempSrc:  Oral Oral Oral  SpO2:  93% 93% 93%  Weight: 98.6 kg     Height:        Intake/Output Summary (Last 24 hours) at 12/23/2020 1407 Last data filed at 12/23/2020 1300 Gross per 24 hour  Intake 480 ml  Output 20 ml  Net 460 ml   Filed Weights   12/21/20 0450 12/22/20 0500 12/23/20 0258  Weight: 102.4 kg 99.7 kg 98.6 kg    Examination: General exam: Appears comfortable  HEENT: PERRLA, oral mucosa moist, no sclera icterus or thrush Respiratory system: Clear to auscultation. Respiratory effort normal. Cardiovascular system: S1 & S2 heard, IIRR.   Gastrointestinal system: Abdomen soft, mildly tender around the left lower quadrant-drain is present with the yellow-green fluid in it-nondistended. Normal bowel  sounds. Central nervous system: Alert and oriented. No focal neurological deficits. Extremities: No cyanosis, clubbing or edema Skin: No rashes or ulcers Psychiatry:  Mood & affect appropriate.     Data Reviewed: I have personally reviewed following labs and imaging studies  CBC: Recent Labs  Lab 12/19/20 0024 12/20/20 0656 12/21/20 0315 12/22/20 0305 12/23/20 0132  WBC 15.3* 16.1* 16.8* 8.9 8.4  HGB 13.3 13.0 13.5 12.0* 12.3*  HCT 39.5 39.2 40.6 35.6* 37.1*  MCV 96.6 99.2 101.2* 98.9 98.9  PLT 299 288 328 283 546   Basic Metabolic Panel: Recent Labs  Lab 12/17/20 0732 12/18/20 0455 12/19/20 0024 12/20/20 0656 12/21/20 0315 12/22/20 0305 12/23/20 0132  NA 138 139 139 142 139 139 139  K 4.0 3.5 4.1 3.8 4.2 4.0 3.7  CL 106 110 111 112* 110 110 108  CO2 21* 23 22 25  18* 22 25  GLUCOSE 102* 115* 129* 109* 89 85 92  BUN 46* 36* 27* 18 18 16 12   CREATININE 1.65* 1.43* 1.29* 1.23 1.09 1.11 1.11  CALCIUM 9.5 8.8* 8.7* 9.0 8.9 8.6* 8.5*  MG 2.1 2.0 2.1  --   --   --   --    GFR: Estimated Creatinine Clearance: 62.4 mL/min (by C-G formula based on SCr of 1.11 mg/dL). Liver Function Tests: No results for input(s): AST, ALT, ALKPHOS, BILITOT, PROT, ALBUMIN in the last 168 hours. No results for input(s): LIPASE, AMYLASE in the last 168 hours. No results for input(s): AMMONIA in the last 168 hours. Coagulation Profile: Recent Labs  Lab 12/21/20 0713  INR 1.3*   Cardiac Enzymes: No results for input(s): CKTOTAL, CKMB, CKMBINDEX, TROPONINI in the last 168 hours. BNP (last 3 results) No results for input(s): PROBNP in the last 8760 hours. HbA1C: No results for input(s): HGBA1C in the last 72 hours. CBG: No results for input(s): GLUCAP in the last 168 hours. Lipid Profile: No results for input(s): CHOL, HDL, LDLCALC, TRIG, CHOLHDL, LDLDIRECT in the last 72 hours. Thyroid Function Tests: No results for input(s): TSH, T4TOTAL, FREET4, T3FREE, THYROIDAB in the last 72  hours. Anemia Panel: No results for input(s): VITAMINB12, FOLATE, FERRITIN, TIBC, IRON, RETICCTPCT in the last 72 hours. Urine analysis:    Component Value Date/Time   COLORURINE AMBER (A) 12/15/2020 1614   APPEARANCEUR HAZY (A) 12/15/2020 1614   LABSPEC 1.024 12/15/2020 1614   PHURINE 5.0 12/15/2020 1614   GLUCOSEU NEGATIVE 12/15/2020 1614   GLUCOSEU NEGATIVE 06/23/2020 1044   HGBUR NEGATIVE 12/15/2020 1614   BILIRUBINUR NEGATIVE 12/15/2020 1614   KETONESUR 5 (A) 12/15/2020 1614   PROTEINUR NEGATIVE 12/15/2020 1614   UROBILINOGEN 1.0 06/23/2020 1044   NITRITE NEGATIVE 12/15/2020 1614   LEUKOCYTESUR TRACE (A) 12/15/2020 1614   Sepsis Labs: @LABRCNTIP (procalcitonin:4,lacticidven:4) ) Recent Results (from  the past 240 hour(s))  Resp Panel by RT-PCR (Flu A&B, Covid) Nasopharyngeal Swab     Status: None   Collection Time: 12/15/20 10:21 PM   Specimen: Nasopharyngeal Swab; Nasopharyngeal(NP) swabs in vial transport medium  Result Value Ref Range Status   SARS Coronavirus 2 by RT PCR NEGATIVE NEGATIVE Final    Comment: (NOTE) SARS-CoV-2 target nucleic acids are NOT DETECTED.  The SARS-CoV-2 RNA is generally detectable in upper respiratory specimens during the acute phase of infection. The lowest concentration of SARS-CoV-2 viral copies this assay can detect is 138 copies/mL. A negative result does not preclude SARS-Cov-2 infection and should not be used as the sole basis for treatment or other patient management decisions. A negative result may occur with  improper specimen collection/handling, submission of specimen other than nasopharyngeal swab, presence of viral mutation(s) within the areas targeted by this assay, and inadequate number of viral copies(<138 copies/mL). A negative result must be combined with clinical observations, patient history, and epidemiological information. The expected result is Negative.  Fact Sheet for Patients:   EntrepreneurPulse.com.au  Fact Sheet for Healthcare Providers:  IncredibleEmployment.be  This test is no t yet approved or cleared by the [place] FDA and  has been authorized for detection and/or diagnosis of SARS-CoV-2 by FDA under an Emergency Use Authorization (EUA). This EUA will remain  in effect (meaning this test can be used) for the duration of the COVID-19 declaration under Section 564(b)(1) of the Act, 21 U.S.C.section 360bbb-3(b)(1), unless the authorization is terminated  or revoked sooner.       Influenza A by PCR NEGATIVE NEGATIVE Final   Influenza B by PCR NEGATIVE NEGATIVE Final    Comment: (NOTE) The Xpert Xpress SARS-CoV-2/FLU/RSV plus assay is intended as an aid in the diagnosis of influenza from Nasopharyngeal swab specimens and should not be used as a sole basis for treatment. Nasal washings and aspirates are unacceptable for Xpert Xpress SARS-CoV-2/FLU/RSV testing.  Fact Sheet for Patients: EntrepreneurPulse.com.au  Fact Sheet for Healthcare Providers: IncredibleEmployment.be  This test is not yet approved or cleared by the [place] FDA and has been authorized for detection and/or diagnosis of SARS-CoV-2 by FDA under an Emergency Use Authorization (EUA). This EUA will remain in effect (meaning this test can be used) for the duration of the COVID-19 declaration under Section 564(b)(1) of the Act, 21 U.S.C. section 360bbb-3(b)(1), unless the authorization is terminated or revoked.  Performed at [facility], [place] [address] [place], [place] [address]   Blood culture (routine x 2)     Status: None   Collection Time: 12/15/20 10:34 PM   Specimen: BLOOD  Result Value Ref Range Status   Specimen Description BLOOD RIGHT FOREARM  Final   Special Requests   Final    BOTTLES DRAWN AEROBIC AND ANAEROBIC Blood Culture results may not be optimal due to an inadequate volume  of blood received in culture bottles   Culture   Final    NO GROWTH 5 DAYS Performed at [facility], [place] [address] [place], [place] [address]    Report Status 12/21/2020 FINAL  Final  Blood culture (routine x 2)     Status: None   Collection Time: 12/15/20 11:32 PM   Specimen: BLOOD  Result Value Ref Range Status   Specimen Description BLOOD RIGHT ARM  Final   Special Requests   Final    BOTTLES DRAWN AEROBIC AND ANAEROBIC Blood Culture adequate volume   Culture   Final  NO GROWTH 5 DAYS Performed at [facility], [place] [address], [place], [place] [address]    Report Status 12/21/2020 FINAL  Final  MRSA PCR Screening     Status: None   Collection Time: 12/18/20  4:45 AM   Specimen: Nasal Mucosa; Nasopharyngeal  Result Value Ref Range Status   MRSA by PCR NEGATIVE NEGATIVE Final    Comment:        The GeneXpert MRSA Assay (FDA approved for NASAL specimens only), is one component of a comprehensive MRSA colonization surveillance program. It is not intended to diagnose MRSA infection nor to guide or monitor treatment for MRSA infections. Performed at [facility], [place] [address], [place], [place] [address]   Aerobic/Anaerobic Culture (surgical/deep wound)     Status: None (Preliminary result)   Collection Time: 12/21/20 10:38 AM   Specimen: Abscess  Result Value Ref Range Status   Specimen Description ABSCESS DRAIN  Final   Special Requests Normal  Final   Gram Stain   Final    ABUNDANT WBC PRESENT, PREDOMINANTLY PMN RARE BUDDING YEAST SEEN Performed at [facility], [place] [address], [place], [place] [address]    Culture   Final    ABUNDANT GRAM NEGATIVE RODS IDENTIFICATION AND SUSCEPTIBILITIES TO FOLLOW NO ANAEROBES ISOLATED; CULTURE IN PROGRESS FOR 5 DAYS    Report Status PENDING  Incomplete         Radiology Studies: No results found.    Scheduled Meds: . carvedilol  25 mg Oral BID WC  . Chlorhexidine Gluconate Cloth  6 each  Topical Daily  . dofetilide  500 mcg Oral BID  . feeding supplement  237 mL Oral TID BM  . losartan  25 mg Oral Daily  . melatonin  3 mg Oral QHS  . pantoprazole (PROTONIX) IV  40 mg Intravenous Daily  . potassium chloride  20 mEq Oral Daily  . sodium chloride flush  5 mL Intracatheter Q8H   Continuous Infusions: . heparin 2,100 Units/hr (12/23/20 0459)  . piperacillin-tazobactam (ZOSYN)  IV 3.375 g (12/23/20 1112)     LOS: 8 days      Debbe Odea, MD [facility] Pager: [email or web address] 12/23/2020, 2:07 PM

## 2020-12-23 NOTE — Progress Notes (Signed)
[place] for Heparin Indication: atrial fibrillation  Allergies  Allergen Reactions  . Amiodarone Other (See Comments)    Peripheral neuropathy resulted  . Niacin Other (See Comments)    Caused an irregular heartbeat  . Ace Inhibitors Cough  . Mometasone Furoate Other (See Comments)    (Nasonex) Causes nasal bleeding and nose bleeds    Vital Signs: Temp: 98 F (36.7 C) (04/14 1334) Temp Source: Oral (04/14 1334) BP: 111/47 (04/14 1334) Pulse Rate: 79 (04/14 1334)  Heparin dosing weight = 99 kg  Labs: Recent Labs    12/21/20 0315 12/21/20 0315 12/21/20 0713 12/22/20 0305 12/22/20 1156 12/22/20 1949 12/23/20 0132  HGB 13.5  --   --  12.0*  --   --  12.3*  HCT 40.6  --   --  35.6*  --   --  37.1*  PLT 328  --   --  283  --   --  322  LABPROT  --   --  16.6*  --   --   --   --   INR  --   --  1.3*  --   --   --   --   HEPARINUNFRC  --    < >  --  0.20* 0.34 0.29* 0.37  CREATININE 1.09  --   --  1.11  --   --  1.11   < > = values in this interval not displayed.    Estimated Creatinine Clearance: 62.4 mL/min (by C-G formula based on SCr of 1.11 mg/dL).   Assessment: 80 y/o M with abdominal pain found to have diverticulitis/perforation. On apixaban PTA for afib. He is now s/p drain placement for LLQ abscess and heparin restarted 6 hours post procedure.  Heparin level is at goal at 0.37 units/mL. No bleeding reported; no issue with heparin infusion.  Goal of Therapy:  Heparin level 0.3-0.7 units/ml aPTT 66-102 seconds Monitor platelets by anticoagulation protocol: Yes   Plan:  Continue IV heparin to 2100 units/hr F/U AM labs  Erin Hearing PharmD., BCPS Clinical Pharmacist 12/23/2020 2:48 PM

## 2020-12-23 NOTE — Progress Notes (Signed)
Referring Physician(s): Tsuei,M  Supervising Physician: Jacqulynn Cadet  Patient Status:  [facility] - In-pt  Chief Complaint: Abdominal pain/diverticular abscess   Subjective: Patient doing fairly well today; states that he has less abdominal pain since abdominal drain placed; currently denies nausea /vomiting; tolerating full liquid diet okay   Allergies: Amiodarone, Niacin, Ace inhibitors, and Mometasone furoate  Medications: Prior to Admission medications   Medication Sig Start Date End Date Taking? Authorizing Provider  amoxicillin-clavulanate (AUGMENTIN) 875-125 MG tablet Take 1 tablet by mouth 2 (two) times daily. One po bid x 7 days 12/11/20  Yes Pfeiffer, Jeannie Done, MD  atorvastatin (LIPITOR) 80 MG tablet TAKE 1 TABLET BY MOUTH  DAILY Patient taking differently: Take 80 mg by mouth daily. 09/23/20  Yes Deboraha Sprang, MD  CALCIUM PO Take 600 mg by mouth 2 (two) times daily.   Yes [provider]  carvedilol (COREG) 25 MG tablet TAKE 2 TABLETS BY MOUTH  TWICE DAILY Patient taking differently: Take 50 mg by mouth 2 (two) times daily with a meal. 09/23/20  Yes Deboraha Sprang, MD  Cholecalciferol (D3 VITAMIN PO) Take 4,000 Units by mouth daily.   Yes [provider]  Coenzyme Q10 (COQ-10 PO) Take 300 mg by mouth daily.   Yes [provider]  Cyanocobalamin (VITAMIN B 12) 500 MCG TABS Take 500 mcg by mouth 3 (three) times a week.   Yes [provider]  dofetilide (TIKOSYN) 500 MCG capsule TAKE 1 CAPSULE BY MOUTH  TWICE DAILY Patient taking differently: Take 500 mcg by mouth 2 (two) times daily. 09/23/20  Yes Deboraha Sprang, MD  ELIQUIS 5 MG TABS tablet TAKE 1 TABLET BY MOUTH  TWICE DAILY Patient taking differently: Take 5 mg by mouth 2 (two) times daily. 09/23/20  Yes Deboraha Sprang, MD  fenofibrate 160 MG tablet TAKE 1 TABLET BY MOUTH  DAILY Patient taking differently: Take 160 mg by mouth daily. 09/23/20  Yes Deboraha Sprang, MD  fexofenadine  (ALLEGRA) 180 MG tablet Take 180 mg by mouth daily.   Yes [provider]  Fluocinonide Emulsified Base 0.05 % CREA APPLY TO AFFECTED AREA(S)  TWO TIMES DAILY AS NEEDED 01/16/19  Yes Biagio Borg, MD  folic acid (FOLVITE) 341 MCG tablet Take 400 mcg by mouth daily.   Yes [provider]  levofloxacin (LEVAQUIN) 500 MG tablet Take 500 mg by mouth daily. 12/12/20  Yes [provider]  losartan (COZAAR) 25 MG tablet TAKE 1 TABLET BY MOUTH  DAILY Patient taking differently: Take 12.5 mg by mouth in the morning and at bedtime. 09/23/20  Yes Deboraha Sprang, MD  Lysine 500 MG TABS Take 500 mg by mouth 3 (three) times daily.   Yes [provider]  Magnesium 250 MG TABS Take 250 mg by mouth 2 (two) times daily.    Yes [provider]  metroNIDAZOLE (FLAGYL) 500 MG tablet Take 500 mg by mouth 2 (two) times daily. 12/12/20  Yes [provider]  potassium chloride (KLOR-CON) 10 MEQ tablet TAKE 1 TABLET BY MOUTH  TWICE DAILY Patient taking differently: Take 10 mEq by mouth daily. 09/23/20  Yes Deboraha Sprang, MD  ranolazine (RANEXA) 500 MG 12 hr tablet Take 1 tablet (500 mg total) by mouth 2 (two) times daily. 06/01/20  Yes Deboraha Sprang, MD  traMADol (ULTRAM) 50 MG tablet 1-2 tablets every 6 hours as needed for pain Patient taking differently: Take 50-100 mg by mouth every 6 (six)  hours as needed for moderate pain. 12/11/20  Yes Charlesetta Shanks, MD  ranitidine (ZANTAC) 300 MG tablet Take 1 tablet (300 mg total) by mouth at bedtime. Patient not taking: Reported on 12/17/2020 03/08/17   Jiles Prows, MD     Vital Signs: BP 112/63 (BP Location: Left Arm)   Pulse 83   Temp 98.1 F (36.7 C) (Oral)   Resp 20   Ht 6\' 3"  (1.905 m)   Wt 217 lb 6.4 oz (98.6 kg)   SpO2 93%   BMI 27.17 kg/m   Physical Exam awake, alert.  Left lower abdominal drain intact, small amount of old blood at insertion site, minimal tenderness to palpation, output 20 cc of  reddish-tan-colored fluid  Imaging: CT ABDOMEN PELVIS W CONTRAST  Result Date: 12/20/2020 CLINICAL DATA:  Abdominal pain. EXAM: CT ABDOMEN AND PELVIS WITH CONTRAST TECHNIQUE: Multidetector CT imaging of the abdomen and pelvis was performed using the standard protocol following bolus administration of intravenous contrast. CONTRAST:  158mL OMNIPAQUE IOHEXOL 300 MG/ML  SOLN COMPARISON:  December 15, 2020. FINDINGS: Lower chest: No acute abnormality. Hepatobiliary: No gallstones or biliary dilatation is noted. 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 Pancreas: Unremarkable. No pancreatic ductal dilatation or surrounding inflammatory changes. Spleen: Normal in size without focal abnormality. Adrenals/Urinary Tract: Adrenal glands appear normal. Left renal cyst is noted. No hydronephrosis or renal obstruction is noted. No renal or ureteral calculi are noted. Urinary bladder is decompressed. Stomach/Bowel: Mild gastric distention is noted. There is significantly worsened small bowel dilatation concerning for obstruction. There is interval development of 10.9 x 6.6 cm fluid collection in the left lower quadrant of the pelvis concerning for abscess. Continued findings are noted consistent with sigmoid diverticulitis which appears to have progressed. Vascular/Lymphatic: Aortic atherosclerosis. No enlarged abdominal or pelvic lymph nodes. Reproductive: Prostate is unremarkable. Other: Increased amount of free fluid is noted in the pelvis and around the liver. No definite hernia is noted. Musculoskeletal: No acute or significant osseous findings. IMPRESSION: Interval development of 10.9 x 6.6 cm fluid collection in the left lower quadrant and pelvis consistent with paradiverticular abscess. This is consistent with worsening sigmoid diverticulitis. Also noted is significantly increased small bowel dilatation concerning for distal small bowel obstruction, most likely related to diverticulitis.  Increased amount of free fluid is noted in the pelvis as well as in the right upper quadrant around the liver. These results will be called to the ordering clinician or representative by the Radiologist Assistant, and communication documented in the PACS or zVision Dashboard. Probable hepatic cirrhosis. Aortic Atherosclerosis (ICD10-I70.0). Electronically Signed   By: Marijo Conception M.D.   On: 12/20/2020 16:35   DG CHEST PORT 1 VIEW  Result Date: 12/20/2020 CLINICAL DATA:  Shortness of breath EXAM: PORTABLE CHEST 1 VIEW COMPARISON:  12/15/2020 FINDINGS: Cardiomegaly, vascular congestion. Bibasilar opacities, likely atelectasis. Left pacer remains in place, unchanged. No acute bony abnormality. IMPRESSION: Cardiomegaly with vascular congestion and bibasilar atelectasis. Electronically Signed   By: Rolm Baptise M.D.   On: 12/20/2020 09:24   DG Abd Portable 1V  Result Date: 12/20/2020 CLINICAL DATA:  Distended abdomen EXAM: PORTABLE ABDOMEN - 1 VIEW COMPARISON:  CT abdomen pelvis 12/15/2020 FINDINGS: Diffusely distended large and small bowel loops.  Distended stomach. No abnormal calcifications.  Bilateral hip replacement. IMPRESSION: Mildly distended large and small bowel loops most consistent with ileus. Electronically Signed   By: Franchot Gallo M.D.   On: 12/20/2020 09:27   CT IMAGE GUIDED DRAINAGE  BY PERCUTANEOUS CATHETER  Result Date: 12/21/2020 CLINICAL DATA:  Enlarging left pelvic diverticular abscess EXAM: CT GUIDED DRAINAGE OF PELVIC ABSCESS ANESTHESIA/SEDATION: Intravenous Fentanyl 22mcg and Versed 2mg  were administered as conscious sedation during continuous monitoring of the patient's level of consciousness and physiological / cardiorespiratory status by the radiology RN, with a total moderate sedation time of 13 minutes. PROCEDURE: The procedure, risks, benefits, and alternatives were explained to the patient. Questions regarding the procedure were encouraged and answered. The patient  understands and consents to the procedure. Select axial scans through the pelvis were obtained in the left peritoneal collection was localized. An appropriate skin site was determined and marked. The operative field was prepped with chlorhexidinein a sterile fashion, and a sterile drape was applied covering the operative field. A sterile gown and sterile gloves were used for the procedure. Local anesthesia was provided with 1% Lidocaine. Under CT fluoroscopic guidance, 18 gauge percutaneous entry needle advanced into the collection. Purulent material returned. Amplatz guidewire advanced easily, position confirmed on CT. Tract dilated to facilitate placement 12 French pigtail drain catheter, formed centrally within the collection. CT confirms good catheter position. Catheter secured externally 0 Prolene suture and StatLock and placed to gravity drain bag. 20 mL aspirate sent for Gram stain and culture. The patient tolerated the procedure well. COMPLICATIONS: None immediate FINDINGS: Loculated left lower quadrant pelvic peritoneal collection was localized. 12 French pigtail drain catheter placed as above. Purulent aspirate sample sent for Gram stain and culture. IMPRESSION: Technically successful CT-guided pelvic abscess drain catheter placement. Electronically Signed   By: Lucrezia Europe M.D.   On: 12/21/2020 14:37    Labs:  CBC: Recent Labs    12/20/20 0656 12/21/20 0315 12/22/20 0305 12/23/20 0132  WBC 16.1* 16.8* 8.9 8.4  HGB 13.0 13.5 12.0* 12.3*  HCT 39.2 40.6 35.6* 37.1*  PLT 288 328 283 322    COAGS: Recent Labs    12/11/20 1828 12/16/20 1007 12/17/20 2116 12/18/20 0455 12/19/20 0024 12/20/20 0639 12/21/20 0713  INR 1.9*  --   --   --   --   --  1.3*  APTT  --    < > 123* 102* 92* 80*  --    < > = values in this interval not displayed.    BMP: Recent Labs    12/20/20 0656 12/21/20 0315 12/22/20 0305 12/23/20 0132  NA 142 139 139 139  K 3.8 4.2 4.0 3.7  CL 112* 110 110 108   CO2 25 18* 22 25  GLUCOSE 109* 89 85 92  BUN 18 18 16 12   CALCIUM 9.0 8.9 8.6* 8.5*  CREATININE 1.23 1.09 1.11 1.11  GFRNONAA 59* >60 >60 >60    LIVER FUNCTION TESTS: Recent Labs    06/23/20 1044 12/11/20 1828 12/15/20 1614 12/16/20 0200  BILITOT 0.8 1.6* 1.5* 1.1  AST 23 26 44* 30  ALT 18 24 35 26  ALKPHOS 35* 40 83 54  PROT 6.7 6.4* 6.5 5.0*  ALBUMIN 4.1 3.8 3.1* 2.3*    Assessment and Plan: Patient with history of A. fib, coronary artery disease with prior stenting, obstructive sleep apnea, chronic kidney disease, diverticulitis with associated perforation and abscess, status post left lower quadrant drain placement on 4/12; afebrile, WBC normal, hemoglobin stable, creatinine 1.11, drain fluid cultures growing gram-negative rods; continue drain irrigation, output and lab monitoring; once output minimal obtain follow-up CT; will also need drain injection before removal; if patient discharged home soon can arrange for drain clinic follow-up accordingly  Electronically Signed: D. Rowe Robert, PA-C 12/23/2020, 12:20 PM   I spent a total of 15 minutes at the the patient's bedside AND on the patient's hospital floor or unit, greater than 50% of which was counseling/coordinating care for left lower abdominal abscess drain    Patient ID: [name], male   DOB: 1940-09-21, 80 y.o.   MRN: [id number]

## 2020-12-24 ENCOUNTER — Inpatient Hospital Stay: Payer: Self-pay

## 2020-12-24 DIAGNOSIS — A419 Sepsis, unspecified organism: Secondary | ICD-10-CM

## 2020-12-24 DIAGNOSIS — N179 Acute kidney failure, unspecified: Secondary | ICD-10-CM | POA: Diagnosis not present

## 2020-12-24 DIAGNOSIS — G4733 Obstructive sleep apnea (adult) (pediatric): Secondary | ICD-10-CM

## 2020-12-24 DIAGNOSIS — N1831 Chronic kidney disease, stage 3a: Secondary | ICD-10-CM | POA: Diagnosis not present

## 2020-12-24 DIAGNOSIS — B9620 Unspecified Escherichia coli [E. coli] as the cause of diseases classified elsewhere: Secondary | ICD-10-CM

## 2020-12-24 DIAGNOSIS — E43 Unspecified severe protein-calorie malnutrition: Secondary | ICD-10-CM | POA: Insufficient documentation

## 2020-12-24 DIAGNOSIS — Z1611 Resistance to penicillins: Secondary | ICD-10-CM

## 2020-12-24 DIAGNOSIS — R6520 Severe sepsis without septic shock: Secondary | ICD-10-CM

## 2020-12-24 DIAGNOSIS — K5720 Diverticulitis of large intestine with perforation and abscess without bleeding: Secondary | ICD-10-CM | POA: Diagnosis not present

## 2020-12-24 DIAGNOSIS — I480 Paroxysmal atrial fibrillation: Secondary | ICD-10-CM | POA: Diagnosis not present

## 2020-12-24 LAB — HEPARIN LEVEL (UNFRACTIONATED): Heparin Unfractionated: 0.58 IU/mL (ref 0.30–0.70)

## 2020-12-24 MED ORDER — SODIUM CHLORIDE 0.9 % IV SOLN
1.0000 g | Freq: Three times a day (TID) | INTRAVENOUS | Status: DC
  Filled 2020-12-24 (×3): qty 1

## 2020-12-24 MED ORDER — CEFTRIAXONE IV (FOR PTA / DISCHARGE USE ONLY): 2.0000 g | INTRAVENOUS | 0 refills | Status: DC

## 2020-12-24 MED ORDER — POTASSIUM CHLORIDE CRYS ER 20 MEQ PO TBCR
40.0000 meq | EXTENDED_RELEASE_TABLET | Freq: Once | ORAL | Status: AC
  Administered 2020-12-24: 40 meq via ORAL
  Filled 2020-12-24: qty 2

## 2020-12-24 MED ORDER — SODIUM CHLORIDE 0.9 % IV SOLN
2.0000 g | INTRAVENOUS | Status: DC
  Administered 2020-12-24 – 2020-12-25 (×2): 2 g via INTRAVENOUS
  Filled 2020-12-24 (×2): qty 20
  Filled 2020-12-24: qty 2

## 2020-12-24 MED ORDER — SODIUM CHLORIDE 0.9% FLUSH
10.0000 mL | INTRAVENOUS | Status: DC | PRN
Start: 2020-12-24 — End: 2020-12-25

## 2020-12-24 MED ORDER — POTASSIUM CHLORIDE CRYS ER 20 MEQ PO TBCR
20.0000 meq | EXTENDED_RELEASE_TABLET | Freq: Every day | ORAL | Status: DC
  Administered 2020-12-25: 20 meq via ORAL
  Filled 2020-12-24: qty 1

## 2020-12-24 MED ORDER — SODIUM CHLORIDE 0.9% FLUSH
10.0000 mL | Freq: Two times a day (BID) | INTRAVENOUS | Status: DC
Start: 2020-12-24 — End: 2020-12-25
  Administered 2020-12-24 – 2020-12-25 (×2): 10 mL

## 2020-12-24 MED ORDER — METRONIDAZOLE 500 MG PO TABS
500.0000 mg | ORAL_TABLET | Freq: Three times a day (TID) | ORAL | Status: DC
  Administered 2020-12-24 – 2020-12-25 (×4): 500 mg via ORAL
  Filled 2020-12-24 (×4): qty 1

## 2020-12-24 MED ORDER — DILTIAZEM HCL ER COATED BEADS 120 MG PO CP24
120.0000 mg | ORAL_CAPSULE | Freq: Every day | ORAL | Status: DC
  Administered 2020-12-24 – 2020-12-25 (×2): 120 mg via ORAL
  Filled 2020-12-24 (×2): qty 1

## 2020-12-24 NOTE — Progress Notes (Signed)
[facility] for Heparin Indication: atrial fibrillation  Allergies  Allergen Reactions  . Amiodarone Other (See Comments)    Peripheral neuropathy resulted  . Niacin Other (See Comments)    Caused an irregular heartbeat  . Ace Inhibitors Cough  . Mometasone Furoate Other (See Comments)    (Nasonex) Causes nasal bleeding and nose bleeds    Vital Signs: Temp: 97.6 F (36.4 C) (04/15 0305) Temp Source: Oral (04/15 0305) BP: 112/77 (04/15 0305) Pulse Rate: 94 (04/15 0305)  Heparin dosing weight = 99 kg  Labs: Recent Labs    12/22/20 0305 12/22/20 1156 12/22/20 1949 12/23/20 0132 12/24/20 0302  HGB 12.0*  --   --  12.3*  --   HCT 35.6*  --   --  37.1*  --   PLT 283  --   --  322  --   HEPARINUNFRC 0.20*   < > 0.29* 0.37 0.58  CREATININE 1.11  --   --  1.11  --    < > = values in this interval not displayed.    Estimated Creatinine Clearance: 62.4 mL/min (by C-G formula based on SCr of 1.11 mg/dL).   Assessment: 80 y/o M with abdominal pain found to have diverticulitis/perforation. On apixaban PTA for afib. He is now s/p drain placement for LLQ abscess and heparin restarted 6 hours post procedure.  Heparin level is at goal at 0.58 units/mL. No bleeding reported; no issue with heparin infusion.  Goal of Therapy:  Heparin level 0.3-0.7 units/ml Monitor platelets by anticoagulation protocol: Yes   Plan:  Continue IV heparin to 2100 units/hr Likely able to transition back to doac today  Erin Hearing PharmD., BCPS Clinical Pharmacist 12/24/2020 7:46 AM

## 2020-12-24 NOTE — Progress Notes (Signed)
@  0345, PRN IV Lopressor administered for pt's Afib RVR with rates sustaining 120s-140s (pt asymptomatic). Will continue to monitor and page cardiology if elevated rate persists. Pt aware and amenable to plan.

## 2020-12-24 NOTE — Progress Notes (Signed)
Pt refused cpap for tonight 

## 2020-12-24 NOTE — Progress Notes (Signed)
Peripherally Inserted Central Catheter Placement  The IV Nurse has discussed with the patient and/or persons authorized to consent for the patient, the purpose of this procedure and the potential benefits and risks involved with this procedure.  The benefits include less needle sticks, lab draws from the catheter, and the patient may be discharged home with the catheter. Risks include, but not limited to, infection, bleeding, blood clot (thrombus formation), and puncture of an artery; nerve damage and irregular heartbeat and possibility to perform a PICC exchange if needed/ordered by physician.  Alternatives to this procedure were also discussed.  Bard Power PICC patient education guide, fact sheet on infection prevention and patient information card has been provided to patient /or left at bedside.    PICC Placement Documentation  PICC Single Lumen 33/43/56 Right Basilic 46 cm 1 cm (Active)  Indication for Insertion or Continuance of Line Home intravenous therapies (PICC only) 12/24/20 2130  Exposed Catheter (cm) 1 cm 12/24/20 2130  Site Assessment Clean;Dry;Intact 12/24/20 2130  Line Status Flushed;Blood return noted;Infusing 12/24/20 2130  Dressing Type Transparent;Securing device 12/24/20 2130  Dressing Status Clean;Dry;Intact 12/24/20 2130  Antimicrobial disc in place? Yes 12/24/20 2130  Safety Lock Not Applicable 86/16/83 7290  Line Care Connections checked and tightened 12/24/20 2130  Line Adjustment (NICU/IV Team Only) No 12/24/20 2130  Dressing Intervention New dressing 12/24/20 2130  Dressing Change Due 12/31/20 12/24/20 2130       [name] 12/24/2020, 10:01 PM

## 2020-12-24 NOTE — Progress Notes (Signed)
Mobility Specialist - Progress Note   12/24/20 1419  Mobility  Activity Ambulated in hall  Level of Assistance Independent  Assistive Device None  Distance Ambulated (ft) 470 ft  Mobility Response Tolerated well  Mobility performed by Mobility specialist  $Mobility charge 1 Mobility   Pt asx throughout ambulation. HR remained in 70s during ambulation. Pt sitting up on edge of bed after walk, wife and RN in room.   Pricilla Handler Mobility Specialist Mobility Specialist Phone: [phone]

## 2020-12-24 NOTE — Progress Notes (Signed)
IR.  History of perforated diverticulitis complicated by development of enlarging left pelvic diverticular abscess s/p LLQ drain placement in IR 12/21/2020.  If patient is to be discharged, below are discharge instructions: - Flush each drain once daily with 5-10 cc NS flush (patient will need an order for flushes upon discharge). RN aware to teach patient how to manage drains at home. - Record output from each drain once daily. - Follow-up at drain clinic 10-14 days after discharge for CT/possible drain injection (assess for possible drain removal)- IR schedulers to call patient to set up this appointment.  Please call IR with questions/concerns.   Bea Graff Rielynn Trulson, PA-C 12/24/2020, 4:00 PM

## 2020-12-24 NOTE — Progress Notes (Signed)
@  0418, Dr. Ailene Ravel, on-call for cardiology, text-paged regarding pt's Afib RVR persisting despite PRN IV Lopressor administration (HR 110s-140s, pt otherwise asymptomatic).  Page promptly returned, and verbal order given to repage if HR sustains in 140s or if pt becomes symptomatic. No new interventions ordered. Will continue to monitor.

## 2020-12-24 NOTE — Progress Notes (Signed)
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 Even though cefazolin is listed at sens, however, the MIC is 16. 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 D/w Dr. Tommy Medal and he suggested to change zosyn to [facility] for now since it's resistant to Zosyn.  Merrem 1g IV q8  Onnie Boer, PharmD, [place], AAHIVP, CPP Infectious Disease Pharmacist 12/24/2020 2:57 PM

## 2020-12-24 NOTE — Progress Notes (Addendum)
PROGRESS NOTE    [name]   ZSW:[id number]  DOB: 06/07/1941  DOA: 12/15/2020 PCP: Biagio Borg, MD   Brief Narrative:  [name] is an 80 year old male with A-fib on Eliquis, chronic combined heart failure, CAD, OSA, CKD 3 who presents to the hospital for abdominal pain.  He was diagnosed with diverticulitis on 4/2 and presented to the hospital on 4/6 for increasing left lower quadrant pain despite taking Augmentin.  He is found to have diverticulitis with microperforation.  He was placed on IV antibiotics and admitted to the hospital.   Subjective: He has no new complaints today.    Assessment & Plan:   Principal Problem:   Diverticulitis of colon with micro perforation and abscess  -sepsis -Repeat CT scan on 4/11 revealed a 10 x 7 cm abscess -On 4/11 an IR guided drain was placed and per patient, this has drained large amount of yellowish-green liquid -Continue IV Zosyn WBC count improved from 16.8 on 4/12 to -8.9 on 4/13 -Abscess culture > E coli resistant to Zosyn - antibiotics changed to Ceftriaxone and Flagyl - on full liquid diet today per surgery - will order PICC - d/w Dr Laurence Ferrari- he states that he feels it would be reasonable to dc the patient - they plan to see him in about 2 wks in the drain clinic - plan discussed with patient,  Wife, TOC, ID and IR today  Active Problems: Atrial fibrillation with RVR- paroxysmal  -Continue carvedilol 25 mg twice daily, heparin and Tikosyn - Cardiology has d/c'd Oral Cardizem today - will continue IV Heparin until surgery lets [place] know when to switch back to [facility]- d/w surgery today that we are waiting on them to make this decision  Chronic systolic and diastolic heart failure -Echo from 3/22 reveals EF of 40 to 45% and grade 2 diastolic heart failure -Continue to follow-appreciate management by cardiology  AKI on CKD stage III -Creatinine on 12/11/2020 was 1.27-creatinine on admission was noted to be 2.3 -Treated  with IV fluids and with holding losartan -CT imaging unrevealing -Creatinine improved to 1.11  OSA - cont CPAP    Time spent in minutes: 35 DVT prophylaxis: Heparin infusion Code Status: Full code Family Communication: Wife and son at bedside Level of Care: Level of care: Progressive Disposition Plan:  Status is: Inpatient  Remains inpatient appropriate because:IV treatments appropriate due to intensity of illness or inability to take PO   Dispo: The patient is from: Home              Anticipated d/c is to: Home              Patient currently is not medically stable to d/c.   Difficult to place patient No      Consultants:   Cardiology  General surgery Procedures:   Left lower quadrant drain Antimicrobials:  Anti-infectives (From admission, onward)   Start     Dose/Rate Route Frequency Ordered Stop   12/24/20 1630  cefTRIAXone (ROCEPHIN) 2 g in sodium chloride 0.9 % 100 mL IVPB        2 g 200 mL/hr over 30 Minutes Intravenous Every 24 hours 12/24/20 1531     12/24/20 1630  metroNIDAZOLE (FLAGYL) tablet 500 mg        500 mg Oral Every 8 hours 12/24/20 1531     12/24/20 1600  meropenem (MERREM) 1 g in sodium chloride 0.9 % 100 mL IVPB  Status:  Discontinued  1 g 200 mL/hr over 30 Minutes Intravenous Every 8 hours 12/24/20 1453 12/24/20 1531   12/24/20 0000  cefTRIAXone (ROCEPHIN) IVPB        2 g Intravenous Every 24 hours 12/24/20 1658 01/21/21 2359   12/16/20 0600  piperacillin-tazobactam (ZOSYN) IVPB 3.375 g  Status:  Discontinued        3.375 g 12.5 mL/hr over 240 Minutes Intravenous Every 8 hours 12/16/20 0133 12/24/20 1453   12/15/20 2230  piperacillin-tazobactam (ZOSYN) IVPB 3.375 g        3.375 g 100 mL/hr over 30 Minutes Intravenous  Once 12/15/20 2217 12/16/20 0015       Objective: Vitals:   12/24/20 0339 12/24/20 0855 12/24/20 1223 12/24/20 1642  BP:  (!) 104/59 (!) 95/54 101/62  Pulse:  71 70 63  Resp:  20 18 20   Temp:  (!) 97.4 F  (36.3 C) 98.5 F (36.9 C) 98.1 F (36.7 C)  TempSrc:  Oral Oral Oral  SpO2:  93% 95% 95%  Weight: 98.7 kg     Height:        Intake/Output Summary (Last 24 hours) at 12/24/2020 1817 Last data filed at 12/24/2020 1300 Gross per 24 hour  Intake 1804.18 ml  Output 855 ml  Net 949.18 ml   Filed Weights   12/22/20 0500 12/23/20 0258 12/24/20 0339  Weight: 99.7 kg 98.6 kg 98.7 kg    Examination: General exam: Appears comfortable  HEENT: PERRLA, oral mucosa moist, no sclera icterus or thrush Respiratory system: Clear to auscultation. Respiratory effort normal. Cardiovascular system: S1 & S2 heard, regular rate and rhythm Gastrointestinal system: Abdomen soft,  Tender in RLQ and around drain, nondistended. Normal bowel sounds   Central nervous system: Alert and oriented. No focal neurological deficits. Extremities: No cyanosis, clubbing or edema Skin: No rashes or ulcers Psychiatry:  Mood & affect appropriate.     Data Reviewed: I have personally reviewed following labs and imaging studies  CBC: Recent Labs  Lab 12/19/20 0024 12/20/20 0656 12/21/20 0315 12/22/20 0305 12/23/20 0132  WBC 15.3* 16.1* 16.8* 8.9 8.4  HGB 13.3 13.0 13.5 12.0* 12.3*  HCT 39.5 39.2 40.6 35.6* 37.1*  MCV 96.6 99.2 101.2* 98.9 98.9  PLT 299 288 328 283 893   Basic Metabolic Panel: Recent Labs  Lab 12/18/20 0455 12/19/20 0024 12/20/20 0656 12/21/20 0315 12/22/20 0305 12/23/20 0132  NA 139 139 142 139 139 139  K 3.5 4.1 3.8 4.2 4.0 3.7  CL 110 111 112* 110 110 108  CO2 23 22 25  18* 22 25  GLUCOSE 115* 129* 109* 89 85 92  BUN 36* 27* 18 18 16 12   CREATININE 1.43* 1.29* 1.23 1.09 1.11 1.11  CALCIUM 8.8* 8.7* 9.0 8.9 8.6* 8.5*  MG 2.0 2.1  --   --   --   --    GFR: Estimated Creatinine Clearance: 62.4 mL/min (by C-G formula based on SCr of 1.11 mg/dL). Liver Function Tests: No results for input(s): AST, ALT, ALKPHOS, BILITOT, PROT, ALBUMIN in the last 168 hours. No results for  input(s): LIPASE, AMYLASE in the last 168 hours. No results for input(s): AMMONIA in the last 168 hours. Coagulation Profile: Recent Labs  Lab 12/21/20 0713  INR 1.3*   Cardiac Enzymes: No results for input(s): CKTOTAL, CKMB, CKMBINDEX, TROPONINI in the last 168 hours. BNP (last 3 results) No results for input(s): PROBNP in the last 8760 hours. HbA1C: No results for input(s): HGBA1C in the last 72 hours. CBG: No  results for input(s): GLUCAP in the last 168 hours. Lipid Profile: No results for input(s): CHOL, HDL, LDLCALC, TRIG, CHOLHDL, LDLDIRECT in the last 72 hours. Thyroid Function Tests: No results for input(s): TSH, T4TOTAL, FREET4, T3FREE, THYROIDAB in the last 72 hours. Anemia Panel: No results for input(s): VITAMINB12, FOLATE, FERRITIN, TIBC, IRON, RETICCTPCT in the last 72 hours. Urine analysis:    Component Value Date/Time   COLORURINE AMBER (A) 12/15/2020 1614   APPEARANCEUR HAZY (A) 12/15/2020 1614   LABSPEC 1.024 12/15/2020 1614   PHURINE 5.0 12/15/2020 1614   GLUCOSEU NEGATIVE 12/15/2020 1614   GLUCOSEU NEGATIVE 06/23/2020 1044   HGBUR NEGATIVE 12/15/2020 1614   BILIRUBINUR NEGATIVE 12/15/2020 1614   KETONESUR 5 (A) 12/15/2020 1614   PROTEINUR NEGATIVE 12/15/2020 1614   UROBILINOGEN 1.0 06/23/2020 1044   NITRITE NEGATIVE 12/15/2020 1614   LEUKOCYTESUR TRACE (A) 12/15/2020 1614   Sepsis Labs: @LABRCNTIP (procalcitonin:4,lacticidven:4) ) Recent Results (from the past 240 hour(s))  Resp Panel by RT-PCR (Flu A&B, Covid) Nasopharyngeal Swab     Status: None   Collection Time: 12/15/20 10:21 PM   Specimen: Nasopharyngeal Swab; Nasopharyngeal(NP) swabs in vial transport medium  Result Value Ref Range Status   SARS Coronavirus 2 by RT PCR NEGATIVE NEGATIVE Final    Comment: (NOTE) SARS-CoV-2 target nucleic acids are NOT DETECTED.  The SARS-CoV-2 RNA is generally detectable in upper respiratory specimens during the acute phase of infection. The  lowest concentration of SARS-CoV-2 viral copies this assay can detect is 138 copies/mL. A negative result does not preclude SARS-Cov-2 infection and should not be used as the sole basis for treatment or other patient management decisions. A negative result may occur with  improper specimen collection/handling, submission of specimen other than nasopharyngeal swab, presence of viral mutation(s) within the areas targeted by this assay, and inadequate number of viral copies(<138 copies/mL). A negative result must be combined with clinical observations, patient history, and epidemiological information. The expected result is Negative.  Fact Sheet for Patients:  EntrepreneurPulse.com.au  Fact Sheet for Healthcare Providers:  IncredibleEmployment.be  This test is no t yet approved or cleared by the [place] FDA and  has been authorized for detection and/or diagnosis of SARS-CoV-2 by FDA under an Emergency Use Authorization (EUA). This EUA will remain  in effect (meaning this test can be used) for the duration of the COVID-19 declaration under Section 564(b)(1) of the Act, 21 U.S.C.section 360bbb-3(b)(1), unless the authorization is terminated  or revoked sooner.       Influenza A by PCR NEGATIVE NEGATIVE Final   Influenza B by PCR NEGATIVE NEGATIVE Final    Comment: (NOTE) The Xpert Xpress SARS-CoV-2/FLU/RSV plus assay is intended as an aid in the diagnosis of influenza from Nasopharyngeal swab specimens and should not be used as a sole basis for treatment. Nasal washings and aspirates are unacceptable for Xpert Xpress SARS-CoV-2/FLU/RSV testing.  Fact Sheet for Patients: EntrepreneurPulse.com.au  Fact Sheet for Healthcare Providers: IncredibleEmployment.be  This test is not yet approved or cleared by the [place] FDA and has been authorized for detection and/or diagnosis of SARS-CoV-2 by FDA under  an Emergency Use Authorization (EUA). This EUA will remain in effect (meaning this test can be used) for the duration of the COVID-19 declaration under Section 564(b)(1) of the Act, 21 U.S.C. section 360bbb-3(b)(1), unless the authorization is terminated or revoked.  Performed at [facility], [place] [address] [place], [place] [address]   Blood culture (routine x 2)     Status:  None   Collection Time: 12/15/20 10:34 PM   Specimen: BLOOD  Result Value Ref Range Status   Specimen Description BLOOD RIGHT FOREARM  Final   Special Requests   Final    BOTTLES DRAWN AEROBIC AND ANAEROBIC Blood Culture results may not be optimal due to an inadequate volume of blood received in culture bottles   Culture   Final    NO GROWTH 5 DAYS Performed at [facility], [place] [address] [place], [place] [address]    Report Status 12/21/2020 FINAL  Final  Blood culture (routine x 2)     Status: None   Collection Time: 12/15/20 11:32 PM   Specimen: BLOOD  Result Value Ref Range Status   Specimen Description BLOOD RIGHT ARM  Final   Special Requests   Final    BOTTLES DRAWN AEROBIC AND ANAEROBIC Blood Culture adequate volume   Culture   Final    NO GROWTH 5 DAYS Performed at [facility], [address], [place], [place] [address]    Report Status 12/21/2020 FINAL  Final  MRSA PCR Screening     Status: None   Collection Time: 12/18/20  4:45 AM   Specimen: Nasal Mucosa; Nasopharyngeal  Result Value Ref Range Status   MRSA by PCR NEGATIVE NEGATIVE Final    Comment:        The GeneXpert MRSA Assay (FDA approved for NASAL specimens only), is one component of a comprehensive MRSA colonization surveillance program. It is not intended to diagnose MRSA infection nor to guide or monitor treatment for MRSA infections. Performed at [facility], [place] [address] [place], [place] [address]   Aerobic/Anaerobic Culture (surgical/deep wound)     Status: None (Preliminary result)    Collection Time: 12/21/20 10:38 AM   Specimen: Abscess  Result Value Ref Range Status   Specimen Description ABSCESS DRAIN  Final   Special Requests Normal  Final   Gram Stain   Final    ABUNDANT WBC PRESENT, PREDOMINANTLY PMN RARE BUDDING YEAST SEEN Performed at [facility], [place] [address], [place], [place] [address]    Culture   Final    ABUNDANT ESCHERICHIA COLI NO ANAEROBES ISOLATED; CULTURE IN PROGRESS FOR 5 DAYS    Report Status PENDING  Incomplete   Organism ID, Bacteria ESCHERICHIA COLI  Final      Susceptibility   Escherichia coli - MIC*    AMPICILLIN >=32 RESISTANT Resistant     CEFAZOLIN 16 SENSITIVE Sensitive     CEFEPIME <=0.12 SENSITIVE Sensitive     CEFTAZIDIME <=1 SENSITIVE Sensitive     CEFTRIAXONE <=0.25 SENSITIVE Sensitive     CIPROFLOXACIN >=4 RESISTANT Resistant     GENTAMICIN <=1 SENSITIVE Sensitive     IMIPENEM <=0.25 SENSITIVE Sensitive     TRIMETH/SULFA <=20 SENSITIVE Sensitive     AMPICILLIN/SULBACTAM >=32 RESISTANT Resistant     PIP/TAZO >=128 RESISTANT Resistant     * ABUNDANT ESCHERICHIA COLI         Radiology Studies: [place] EKG SITE RITE  Result Date: 12/24/2020 If Site Rite image not attached, placement could not be confirmed due to current cardiac rhythm.     Scheduled Meds: . carvedilol  25 mg Oral BID WC  . Chlorhexidine Gluconate Cloth  6 each Topical Daily  . diltiazem  120 mg Oral Daily  . dofetilide  500 mcg Oral BID  . lactose free nutrition  237 mL Oral TID WC  . losartan  25 mg Oral Daily  . melatonin  3 mg Oral QHS  . metroNIDAZOLE  500 mg Oral Q8H  . pantoprazole (PROTONIX) IV  40 mg Intravenous Daily  . [START ON 12/25/2020] potassium chloride  20 mEq Oral Daily  . sodium chloride flush  5 mL Intracatheter Q8H   Continuous Infusions: . cefTRIAXone (ROCEPHIN)  IV 2 g (12/24/20 1616)  . heparin 2,100 Units/hr (12/24/20 0718)     LOS: 9 days      Debbe Odea, MD [facility] Pager: [email or web address] 12/24/2020, 6:17 PM

## 2020-12-24 NOTE — Consult Note (Addendum)
Date of Admission:  12/15/2020          Reason for Consult: Diverticular abscess with multidrug-resistant E. coli    Referring Provider: Dr Wynelle Cleveland   Assessment:  1. Diverticular abscess with multidrug-resistant E. coli 2. Atrial fibrillation  3. Combined systolic and diastolic heart failure 4. ARF from  5. Sepsis   Plan:  1. Change to ceftriaxone and metronidazole 2. Would continue until there is right radiographic resolution of his abscess 3. If he ends up being discharged over the weekend he will need a PICC line placed and his OPAT orders for traction written by Jimmy Footman cosigned at discharge  Dr West Bali is elbow for questions this weekend.  Will be back on Monday if he still here.  Diagnosis: Diverticular abscess  Culture Result: E. coli  Allergies  Allergen Reactions  . Amiodarone Other (See Comments)    Peripheral neuropathy resulted  . Niacin Other (See Comments)    Caused an irregular heartbeat  . Ace Inhibitors Cough  . Mometasone Furoate Other (See Comments)    (Nasonex) Causes nasal bleeding and nose bleeds    OPAT Orders Discharge antibiotics:  Ceftriaxone 2 g daily IV and metronidazole 500 mg orally 3 times daily Duration:  Tentatively 4 weeks  End Date:  Tentative stop date of 01/22/2019  [facility] Care Per Protocol:    Labs  weekly while on IV antibiotics: _x_ CBC with differential _x_ BMP w GFR/CMP   __ Please pull PIC at completion of IV antibiotics x__ Please leave PIC in place until doctor has seen patient or been notified  Fax weekly labs to [phone]   [name] has an appointment on 01/19/2021 at 11 AM with Dr. Tommy Medal He should arrive 15 to 30 minutes prior to his appointment.  The [facility] is located in the [facility] at  [address] in [place].  [address], which is located to the left of the elevators.  Phone: [phone]  Fax: [phone]  [email or web address]     Principal Problem:   Diverticulitis of colon with perforation Active Problems:   OBSTRUCTIVE SLEEP APNEA   Atrial fibrillation (HCC)   Chronic combined systolic and diastolic CHF (congestive heart failure) (HCC)   Acute renal failure superimposed on stage 3a chronic kidney disease (HCC)   Severe sepsis (HCC)   Protein-calorie malnutrition, severe   Scheduled Meds: . carvedilol  25 mg Oral BID WC  . Chlorhexidine Gluconate Cloth  6 each Topical Daily  . diltiazem  120 mg Oral Daily  . dofetilide  500 mcg Oral BID  . lactose free nutrition  237 mL Oral TID WC  . losartan  25 mg Oral Daily  . melatonin  3 mg Oral QHS  . metroNIDAZOLE  500 mg Oral Q8H  . pantoprazole (PROTONIX) IV  40 mg Intravenous Daily  . [START ON 12/25/2020] potassium chloride  20 mEq Oral Daily  . sodium chloride flush  5 mL Intracatheter Q8H   Continuous Infusions: . cefTRIAXone (ROCEPHIN)  IV 2 g (12/24/20 1616)  . heparin 2,100 Units/hr (12/24/20 0718)   PRN Meds:.acetaminophen **OR** acetaminophen, alum & mag hydroxide-simeth, HYDROmorphone (DILAUDID) injection, metoprolol tartrate  HPI: [name] is a 80 y.o. male with multiple medical problems including atrial fibrillation, chronic combined heart failure coronary disease obstructive sleep apnea chronic kidney disease who presented to the med center at [facility] [facility] on the second.  Was found  to have diverticulitis and discharged on oral Augmentin.  He had worsening abdominal pain and distention and return to the hospital on the 6.  He was found to have a diverticular perforation with an abscess.  Interventional radiology of drained abscess and is now growing E. Coli.  E. coli is resistant to all penicillins to which it was tested against and likely resistant to first generation cephalosporins given the increased MIC to cefazolin.  It is also fluoroquinolone resistant.  Note some budding yeast were  seen on initial Gram stain but I have checked with microbiology again and there have been no yeast isolated only the E. coli.  I was concerned this could be ESBL not picked up by [facility] earlier conversations with Onnie Boer, Pharm D but Jimmy Footman Pharm D believes this is not ESBL but shv mediated R E coli  We will therefore switch to ceftriaxone 2 g IV daily along with oral metronidazole 500 mg 3 times daily.  The patient does not consume alcohol at all but is cautioned about use of antitussive agents over-the-counter that might contain alcohol given the problem with alcohol and metronidazole.   I would continue to give him antibiotics until his abscess has resolved radiographically.  I understand that interventional radiology plan on follow-up imaging prior to discontinuing the drain.  If there is still residual abscess after they pulled drain I would want to treat him until we have had repeat imaging showing resolution of his intra-abdominal abscess.    Review of Systems: Review of Systems  Constitutional: Positive for chills, fever and malaise/fatigue. Negative for diaphoresis and weight loss.  HENT: Negative for congestion, hearing loss, sore throat and tinnitus.   Eyes: Negative for blurred vision and double vision.  Respiratory: Negative for cough, sputum production, shortness of breath and wheezing.   Cardiovascular: Negative for chest pain, palpitations and leg swelling.  Gastrointestinal: Positive for abdominal pain. Negative for blood in stool, constipation, diarrhea, heartburn, melena, nausea and vomiting.  Genitourinary: Negative for dysuria, flank pain and hematuria.  Musculoskeletal: Negative for back pain, falls, joint pain and myalgias.  Skin: Negative for itching and rash.  Neurological: Negative for dizziness, sensory change, focal weakness, loss of consciousness, weakness and headaches.  Endo/Heme/Allergies: Does not bruise/bleed easily.  Psychiatric/Behavioral:  Negative for depression, memory loss and suicidal ideas. The patient is not nervous/anxious.     Past Medical History:  Diagnosis Date  . Allergic rhinitis    takes Allegra daily  . Allergic rhinitis, cause unspecified 06/22/2012  . Arthritis   . Atrial fibrillation ([place])    takes Tikosyn and Eliquis daily  . 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 Coronary artery disease    LAD stenting 2004 non-DES  . Depression    took Zoloft 49yrs ago but nothing now  . Diverticulosis   . DVT (deep venous thrombosis) ([place])   . Dysphagia    occasionally  . History of blood clots 2003   left leg prior to fem pop  . History of colon polyps   . Hyperlipidemia    takes Tricor and Lipitor daily  . Insomnia    takes Ambien nightly as needed  . Joint pain   . Joint swelling   . 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 OSA (obstructive sleep apnea)    CPAP  . Pacemaker   . Peripheral vascular disease ([place])   . Pleurisy    early 90's  . Prostatitis   .  Urinary urgency     Social History   Tobacco Use  . Smoking status: Never Smoker  . Smokeless tobacco: Never Used  Vaping Use  . Vaping Use: Never used  Substance Use Topics  . Alcohol use: No    Alcohol/week: 0.0 standard drinks    Comment: nothing since 2000  . Drug use: No    Family History  Problem Relation Age of Onset  . Diabetes Other        family hx  . Sleep apnea Other        family hx  . Diabetes Mother   . Heart disease Mother        Before age 11  . Heart disease Father        Before age 73  . Heart attack Father   . Stroke Father   . Aneurysm Father        bilat pop art aneurysms  . Peripheral vascular disease Father        Popliteal Aneurysm  . Allergic rhinitis Neg Hx   . Angioedema Neg Hx   . Asthma Neg Hx   . Eczema Neg Hx   . Immunodeficiency Neg Hx   . Urticaria Neg Hx    Allergies  Allergen Reactions  . Amiodarone Other (See Comments)    Peripheral neuropathy resulted   . Niacin Other (See Comments)    Caused an irregular heartbeat  . Ace Inhibitors Cough  . Mometasone Furoate Other (See Comments)    (Nasonex) Causes nasal bleeding and nose bleeds    OBJECTIVE: Blood pressure 101/62, pulse 63, temperature 98.1 F (36.7 C), temperature source Oral, resp. rate 20, height 6\' 3"  (1.905 m), weight 98.7 kg, SpO2 95 %.  Physical Exam Constitutional:      Appearance: He is well-developed.  HENT:     Head: Normocephalic and atraumatic.  Eyes:     Extraocular Movements: Extraocular movements intact.     Conjunctiva/sclera: Conjunctivae normal.  Cardiovascular:     Rate and Rhythm: Normal rate. Rhythm irregular.  Pulmonary:     Effort: Pulmonary effort is normal. No respiratory distress.     Breath sounds: No stridor. No wheezing or rhonchi.  Abdominal:     General: There is distension.     Palpations: Abdomen is soft. There is no mass.     Tenderness: There is abdominal tenderness.  Musculoskeletal:        General: No tenderness. Normal range of motion.     Cervical back: Normal range of motion and neck supple.  Skin:    General: Skin is warm and dry.     Coloration: Skin is not pale.     Findings: No erythema or rash.  Neurological:     General: No focal deficit present.     Mental Status: He is alert and oriented to person, place, and time.  Psychiatric:        Mood and Affect: Mood normal.        Behavior: Behavior normal.        Thought Content: Thought content normal.        Judgment: Judgment normal.    Drain in place with sanguinous material   Lab Results Lab Results  Component Value Date   WBC 8.4 12/23/2020   HGB 12.3 (L) 12/23/2020   HCT 37.1 (L) 12/23/2020   MCV 98.9 12/23/2020   PLT 322 12/23/2020    Lab Results  Component Value Date   CREATININE 1.11 12/23/2020  BUN 12 12/23/2020   NA 139 12/23/2020   K 3.7 12/23/2020   CL 108 12/23/2020   CO2 25 12/23/2020    Lab Results  Component Value Date   ALT 26  12/16/2020   AST 30 12/16/2020   ALKPHOS 54 12/16/2020   BILITOT 1.1 12/16/2020     Microbiology: Recent Results (from the past 240 hour(s))  Resp Panel by RT-PCR (Flu A&B, Covid) Nasopharyngeal Swab     Status: None   Collection Time: 12/15/20 10:21 PM   Specimen: Nasopharyngeal Swab; Nasopharyngeal(NP) swabs in vial transport medium  Result Value Ref Range Status   SARS Coronavirus 2 by RT PCR NEGATIVE NEGATIVE Final    Comment: (NOTE) SARS-CoV-2 target nucleic acids are NOT DETECTED.  The SARS-CoV-2 RNA is generally detectable in upper respiratory specimens during the acute phase of infection. The lowest concentration of SARS-CoV-2 viral copies this assay can detect is 138 copies/mL. A negative result does not preclude SARS-Cov-2 infection and should not be used as the sole basis for treatment or other patient management decisions. A negative result may occur with  improper specimen collection/handling, submission of specimen other than nasopharyngeal swab, presence of viral mutation(s) within the areas targeted by this assay, and inadequate number of viral copies(<138 copies/mL). A negative result must be combined with clinical observations, patient history, and epidemiological information. The expected result is Negative.  Fact Sheet for Patients:  EntrepreneurPulse.com.au  Fact Sheet for Healthcare Providers:  IncredibleEmployment.be  This test is no t yet approved or cleared by the [place] FDA and  has been authorized for detection and/or diagnosis of SARS-CoV-2 by FDA under an Emergency Use Authorization (EUA). This EUA will remain  in effect (meaning this test can be used) for the duration of the COVID-19 declaration under Section 564(b)(1) of the Act, 21 U.S.C.section 360bbb-3(b)(1), unless the authorization is terminated  or revoked sooner.       Influenza A by PCR NEGATIVE NEGATIVE Final   Influenza B by PCR NEGATIVE  NEGATIVE Final    Comment: (NOTE) The Xpert Xpress SARS-CoV-2/FLU/RSV plus assay is intended as an aid in the diagnosis of influenza from Nasopharyngeal swab specimens and should not be used as a sole basis for treatment. Nasal washings and aspirates are unacceptable for Xpert Xpress SARS-CoV-2/FLU/RSV testing.  Fact Sheet for Patients: EntrepreneurPulse.com.au  Fact Sheet for Healthcare Providers: IncredibleEmployment.be  This test is not yet approved or cleared by the [place] FDA and has been authorized for detection and/or diagnosis of SARS-CoV-2 by FDA under an Emergency Use Authorization (EUA). This EUA will remain in effect (meaning this test can be used) for the duration of the COVID-19 declaration under Section 564(b)(1) of the Act, 21 U.S.C. section 360bbb-3(b)(1), unless the authorization is terminated or revoked.  Performed at [facility], [place] [address] [place], [place] [address]   Blood culture (routine x 2)     Status: None   Collection Time: 12/15/20 10:34 PM   Specimen: BLOOD  Result Value Ref Range Status   Specimen Description BLOOD RIGHT FOREARM  Final   Special Requests   Final    BOTTLES DRAWN AEROBIC AND ANAEROBIC Blood Culture results may not be optimal due to an inadequate volume of blood received in culture bottles   Culture   Final    NO GROWTH 5 DAYS Performed at [facility], [place] [address], [place], [place] [address]    Report Status 12/21/2020 FINAL  Final  Blood culture (routine  x 2)     Status: None   Collection Time: 12/15/20 11:32 PM   Specimen: BLOOD  Result Value Ref Range Status   Specimen Description BLOOD RIGHT ARM  Final   Special Requests   Final    BOTTLES DRAWN AEROBIC AND ANAEROBIC Blood Culture adequate volume   Culture   Final    NO GROWTH 5 DAYS Performed at [facility], [address], [place], [place] [address]    Report Status 12/21/2020 FINAL  Final  MRSA  PCR Screening     Status: None   Collection Time: 12/18/20  4:45 AM   Specimen: Nasal Mucosa; Nasopharyngeal  Result Value Ref Range Status   MRSA by PCR NEGATIVE NEGATIVE Final    Comment:        The GeneXpert MRSA Assay (FDA approved for NASAL specimens only), is one component of a comprehensive MRSA colonization surveillance program. It is not intended to diagnose MRSA infection nor to guide or monitor treatment for MRSA infections. Performed at [facility], [place] [address], [place], [place] [address]   Aerobic/Anaerobic Culture (surgical/deep wound)     Status: None (Preliminary result)   Collection Time: 12/21/20 10:38 AM   Specimen: Abscess  Result Value Ref Range Status   Specimen Description ABSCESS DRAIN  Final   Special Requests Normal  Final   Gram Stain   Final    ABUNDANT WBC PRESENT, PREDOMINANTLY PMN RARE BUDDING YEAST SEEN Performed at [facility], [place] [address], [place], [place] [address]    Culture   Final    ABUNDANT ESCHERICHIA COLI NO ANAEROBES ISOLATED; CULTURE IN PROGRESS FOR 5 DAYS    Report Status PENDING  Incomplete   Organism ID, Bacteria ESCHERICHIA COLI  Final      Susceptibility   Escherichia coli - MIC*    AMPICILLIN >=32 RESISTANT Resistant     CEFAZOLIN 16 SENSITIVE Sensitive     CEFEPIME <=0.12 SENSITIVE Sensitive     CEFTAZIDIME <=1 SENSITIVE Sensitive     CEFTRIAXONE <=0.25 SENSITIVE Sensitive     CIPROFLOXACIN >=4 RESISTANT Resistant     GENTAMICIN <=1 SENSITIVE Sensitive     IMIPENEM <=0.25 SENSITIVE Sensitive     TRIMETH/SULFA <=20 SENSITIVE Sensitive     AMPICILLIN/SULBACTAM >=32 RESISTANT Resistant     PIP/TAZO >=128 RESISTANT Resistant     * ABUNDANT ESCHERICHIA COLI    Alcide Evener, [facility] [phone] pager  12/24/2020, 6:01 PM

## 2020-12-24 NOTE — Progress Notes (Signed)
Progress Note  Patient Name: LUCIO LITSEY Date of Encounter: 12/24/2020  [facility] Cardiologist: Evalina Field, MD   Subjective   Denies any chest pain or dyspnea. Afib this morning up to rate 140s, reports was asymptomatic  Inpatient Medications    Scheduled Meds: . carvedilol  25 mg Oral BID WC  . Chlorhexidine Gluconate Cloth  6 each Topical Daily  . dofetilide  500 mcg Oral BID  . lactose free nutrition  237 mL Oral TID WC  . losartan  25 mg Oral Daily  . melatonin  3 mg Oral QHS  . pantoprazole (PROTONIX) IV  40 mg Intravenous Daily  . [START ON 12/25/2020] potassium chloride  20 mEq Oral Daily  . sodium chloride flush  5 mL Intracatheter Q8H   Continuous Infusions: . heparin 2,100 Units/hr (12/24/20 0718)  . piperacillin-tazobactam (ZOSYN)  IV 12.5 mL/hr at 12/24/20 0600   PRN Meds: acetaminophen **OR** acetaminophen, alum & mag hydroxide-simeth, HYDROmorphone (DILAUDID) injection, metoprolol tartrate   Vital Signs    Vitals:   12/23/20 1945 12/23/20 2329 12/24/20 0305 12/24/20 0339  BP: (!) 99/55 125/69 112/77   Pulse: 69  94   Resp: 18 16 (!) 24   Temp: (!) 97.4 F (36.3 C) 97.6 F (36.4 C) 97.6 F (36.4 C)   TempSrc: Oral Oral Oral   SpO2: 97% 95% 93%   Weight:    98.7 kg  Height:        Intake/Output Summary (Last 24 hours) at 12/24/2020 0847 Last data filed at 12/24/2020 0600 Gross per 24 hour  Intake 1804.18 ml  Output 855 ml  Net 949.18 ml   Last 3 Weights 12/24/2020 12/23/2020 12/22/2020  Weight (lbs) 217 lb 8 oz 217 lb 6.4 oz 219 lb 14.4 oz  Weight (kg) 98.657 kg 98.612 kg 99.746 kg      Telemetry    Currently in A paced rhythm at rate 60-70s, but had Afib with rate up to 140s earlier this morning- Personally Reviewed  ECG    No new EKG- Personally Reviewed  Physical Exam   GEN: No acute distress.   Neck: No JVD Cardiac:  RRR, no murmurs, rubs, or gallops.  Respiratory: Clear to auscultation bilaterally. GI:  Distention  resolved MS: No edema Neuro:  Nonfocal  Psych: Normal affect   Labs    High Sensitivity Troponin:  No results for input(s): TROPONINIHS in the last 720 hours.    Chemistry Recent Labs  Lab 12/21/20 0315 12/22/20 0305 12/23/20 0132  NA 139 139 139  K 4.2 4.0 3.7  CL 110 110 108  CO2 18* 22 25  GLUCOSE 89 85 92  BUN 18 16 12   CREATININE 1.09 1.11 1.11  CALCIUM 8.9 8.6* 8.5*  GFRNONAA >60 >60 >60  ANIONGAP 11 7 6      Hematology Recent Labs  Lab 12/21/20 0315 12/22/20 0305 12/23/20 0132  WBC 16.8* 8.9 8.4  RBC 4.01* 3.60* 3.75*  HGB 13.5 12.0* 12.3*  HCT 40.6 35.6* 37.1*  MCV 101.2* 98.9 98.9  MCH 33.7 33.3 32.8  MCHC 33.3 33.7 33.2  RDW 15.8* 15.5 15.5  PLT 328 283 322    BNPNo results for input(s): BNP, PROBNP in the last 168 hours.   DDimer No results for input(s): DDIMER in the last 168 hours.   Radiology    No results found.  Cardiac Studies   Echo 11/24/20: 1. Left ventricular ejection fraction, by estimation, is 40 to 45%. The  left  ventricle has mildly decreased function. The left ventricle  demonstrates regional wall motion abnormalities. Abnormal (paradoxical)  septal motion, consistent with left bundle  branch block. The left ventricular internal cavity size was mildly  dilated. Left ventricular diastolic parameters are consistent with Grade  II diastolic dysfunction (pseudonormalization). Elevated left atrial  pressure.  2. Right ventricular systolic function is normal. The right ventricular  size is moderately enlarged. There is normal pulmonary artery systolic  pressure. The estimated right ventricular systolic pressure is 48.5 mmHg.  3. Left atrial size was severely dilated.  4. Right atrial size was mildly dilated.  5. The mitral valve is normal in structure. Mild mitral valve  regurgitation.  6. The aortic valve is tricuspid. Aortic valve regurgitation is not  visualized. Mild to moderate aortic valve sclerosis/calcification is   present, without any evidence of aortic stenosis.  7. The inferior vena cava is normal in size with greater than 50%  respiratory variability, suggesting right atrial pressure of 3 mmHg.   Patient Profile     80 y.o. 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  Transferred to [place] on 4/9 due to hypotension and A. fib with RVR, underwent urgent cardioversion  Assessment & Plan    Paroxysmal atrial fibrillation -Having paroxysms of atrial fibrillation, currently rate controlled.  Required urgent cardioversion for AF with RVR and hypotension on 4/9 -EP is recommended to continue Tikosyn.  We will continue per their recommendation.  Check EKG to monitor QT -On heparin drip.Transition to DOAC once no procedures planned. -Continue coreg 25 mg twice daily.  Diltiazem was discontinued 4/14 but subsequently had Afib with RVR up to 140s today.  Will restart diltiazem 120 mg daily.  Suspect will not need dilt long term, but will continue for added rate control while he recovers from acute illness  Chronic combined systolic and diastolic heart failure, EF 40-45% -Held losartan in setting of AKI, have restarted now that renal function back to normal -Continue Coreg.  Diverticulitis with perforation -Surgery following, on IV antibiotics.  IR drained pelvic abscess 4/12.  Improving    For questions or updates, please contact [facility] Please consult [email or web address] for contact info under        Signed, Donato Heinz, MD  12/24/2020, 8:47 AM

## 2020-12-24 NOTE — Progress Notes (Signed)
PHARMACY CONSULT NOTE FOR:  OUTPATIENT  PARENTERAL ANTIBIOTIC THERAPY (OPAT)  Indication: Paradiverticular abscess Regimen: Ceftriaxone 2 gm IV Q 24 hours + Metronidazole 500 mg PO Q 8 hours  End date: 01/21/21  IV antibiotic discharge orders are pended. To discharging provider:  please sign these orders via discharge navigator,  Select New Orders & click on the button choice - Manage This Unsigned Work.     Thank you for allowing pharmacy to be a part of this patient's care.  Jimmy Footman, PharmD, BCPS, BCIDP Infectious Diseases Clinical Pharmacist Phone: [phone] 12/24/2020, 4:35 PM

## 2020-12-24 NOTE — Progress Notes (Signed)
6 Days Post-Op  Subjective: CC: + flatus, no BM yet.  Tolerating liquids but not really hungry for more yet.  IR drain working.  Abdominal pain improving.  Objective: Vital signs in last 24 hours: Temp:  [97.3 F (36.3 C)-98 F (36.7 C)] 97.4 F (36.3 C) (04/15 0855) Pulse Rate:  [69-94] 71 (04/15 0855) Resp:  [16-24] 20 (04/15 0855) BP: (99-125)/(47-77) 104/59 (04/15 0855) SpO2:  [93 %-100 %] 93 % (04/15 0855) Weight:  [98.7 kg] 98.7 kg (04/15 0339) Last BM Date: 12/22/20  Intake/Output from previous day: 04/14 0701 - 04/15 0700 In: 2044.2 [P.O.:720; I.V.:1012.8; IV Piggyback:301.4] Out: 855 [Urine:825; Drains:30] Intake/Output this shift: No intake/output data recorded.  PE: Gen: Alert, NAD, pleasant Card:irregular Pulm: rate and effort normal on room air SNK:NLZJ distension and soft, + BS heard.  LLQ TTP around drain, no peritonitis. IR drain in place with no output currently, except scant serous fluid Psych: A&Ox3  Lab Results:  Recent Labs    12/22/20 0305 12/23/20 0132  WBC 8.9 8.4  HGB 12.0* 12.3*  HCT 35.6* 37.1*  PLT 283 322   BMET Recent Labs    12/22/20 0305 12/23/20 0132  NA 139 139  K 4.0 3.7  CL 110 108  CO2 22 25  GLUCOSE 85 92  BUN 16 12  CREATININE 1.11 1.11  CALCIUM 8.6* 8.5*   PT/INR No results for input(s): LABPROT, INR in the last 72 hours. CMP     Component Value Date/Time   NA 139 12/23/2020 0132   NA 142 07/08/2019 1455   K 3.7 12/23/2020 0132   CL 108 12/23/2020 0132   CO2 25 12/23/2020 0132   GLUCOSE 92 12/23/2020 0132   BUN 12 12/23/2020 0132   BUN 17 07/08/2019 1455   CREATININE 1.11 12/23/2020 0132   CREATININE 1.18 08/27/2015 1518   CALCIUM 8.5 (L) 12/23/2020 0132   PROT 5.0 (L) 12/16/2020 0200   ALBUMIN 2.3 (L) 12/16/2020 0200   AST 30 12/16/2020 0200   ALT 26 12/16/2020 0200   ALKPHOS 54 12/16/2020 0200   BILITOT 1.1 12/16/2020 0200   GFRNONAA >60 12/23/2020 0132   GFRAA 66 07/08/2019 1455    Lipase     Component Value Date/Time   LIPASE 22 12/15/2020 1614       Studies/Results: No results found.  Anti-infectives: Anti-infectives (From admission, onward)   Start     Dose/Rate Route Frequency Ordered Stop   12/16/20 0600  piperacillin-tazobactam (ZOSYN) IVPB 3.375 g        3.375 g 12.5 mL/hr over 240 Minutes Intravenous Every 8 hours 12/16/20 0133     12/15/20 2230  piperacillin-tazobactam (ZOSYN) IVPB 3.375 g        3.375 g 100 mL/hr over 30 Minutes Intravenous  Once 12/15/20 2217 12/16/20 0015       Assessment/Plan Afib on Eliquis(last dose 12/14/20) - hep gtt, tikosyn; afib with RVR s/p [facility] 4/9 Cardiomyopathy, Combined systolic and diastolicCHF, Hx pacemaker placement; LVEF echo 11/2020 40-45% CADwith stent OSA CKD IIIa - Cr 1.75>1.65>1.43>1.23>1.09>1.11, stable/improving  Diverticulitis with perforation and abscess -CT 4/11 showed 10.9 x 6.6 cm fluid collection in the left lower quadrant and pelvis consistent with paradiverticular abscess.  - S/p IR drain placement 4/12. Cx's pending. Gram stain with abundant wbc present, predominantly pmn. There is also rare budding yeast. ATBX per medicine - CT 4/11 also showed dilated small bowel concerning for SBO likely related to diverticular abscess, should improve as diverticulitis improves. -  Continue full liquid diet today - Okay to restart eliquis anticoagulation  FEN:FLD, IVF per [facility] ID: [id number]>> VTE: SCD's, hep gtt - okay to restart eliquis Foley: none   LOS: 9 days   Felicie Morn, MD [facility] Surgery 12/24/2020, 9:42 AM Please see Amion for pager number during day hours 7:00am-4:30pm

## 2020-12-24 NOTE — Progress Notes (Signed)
Referring Physician(s): Lavina Hamman [facility])  Supervising Physician: Jacqulynn Cadet  Patient Status:  [facility] - In-pt  Chief Complaint: LLQ drain F/U  Subjective:  History of perforated diverticulitis complicated by development of enlarging left pelvic diverticular abscess s/p LLQ drain placement in IR 12/21/2020. Patient laying in bed resting comfortably. He responds to voice and answers questions appropriately. Asking to go home. Wife and son at bedside. LLQ drain site well dressed, bloody drainage on statlock.   Allergies: Amiodarone, Niacin, Ace inhibitors, and Mometasone furoate  Medications: Prior to Admission medications   Medication Sig Start Date End Date Taking? Authorizing Provider  amoxicillin-clavulanate (AUGMENTIN) 875-125 MG tablet Take 1 tablet by mouth 2 (two) times daily. One po bid x 7 days 12/11/20  Yes Pfeiffer, Jeannie Done, MD  atorvastatin (LIPITOR) 80 MG tablet TAKE 1 TABLET BY MOUTH  DAILY Patient taking differently: Take 80 mg by mouth daily. 09/23/20  Yes Deboraha Sprang, MD  CALCIUM PO Take 600 mg by mouth 2 (two) times daily.   Yes [provider]  carvedilol (COREG) 25 MG tablet TAKE 2 TABLETS BY MOUTH  TWICE DAILY Patient taking differently: Take 50 mg by mouth 2 (two) times daily with a meal. 09/23/20  Yes Deboraha Sprang, MD  Cholecalciferol (D3 VITAMIN PO) Take 4,000 Units by mouth daily.   Yes [provider]  Coenzyme Q10 (COQ-10 PO) Take 300 mg by mouth daily.   Yes [provider]  Cyanocobalamin (VITAMIN B 12) 500 MCG TABS Take 500 mcg by mouth 3 (three) times a week.   Yes [provider]  dofetilide (TIKOSYN) 500 MCG capsule TAKE 1 CAPSULE BY MOUTH  TWICE DAILY Patient taking differently: Take 500 mcg by mouth 2 (two) times daily. 09/23/20  Yes Deboraha Sprang, MD  ELIQUIS 5 MG TABS tablet TAKE 1 TABLET BY MOUTH  TWICE DAILY Patient taking differently: Take 5 mg by mouth 2 (two) times daily. 09/23/20  Yes Deboraha Sprang, MD  fenofibrate 160 MG tablet TAKE 1 TABLET BY MOUTH  DAILY Patient taking differently: Take 160 mg by mouth daily. 09/23/20  Yes Deboraha Sprang, MD  fexofenadine (ALLEGRA) 180 MG tablet Take 180 mg by mouth daily.   Yes [provider]  Fluocinonide Emulsified Base 0.05 % CREA APPLY TO AFFECTED AREA(S)  TWO TIMES DAILY AS NEEDED 01/16/19  Yes Biagio Borg, MD  folic acid (FOLVITE) 101 MCG tablet Take 400 mcg by mouth daily.   Yes [provider]  levofloxacin (LEVAQUIN) 500 MG tablet Take 500 mg by mouth daily. 12/12/20  Yes [provider]  losartan (COZAAR) 25 MG tablet TAKE 1 TABLET BY MOUTH  DAILY Patient taking differently: Take 12.5 mg by mouth in the morning and at bedtime. 09/23/20  Yes Deboraha Sprang, MD  Lysine 500 MG TABS Take 500 mg by mouth 3 (three) times daily.   Yes [provider]  Magnesium 250 MG TABS Take 250 mg by mouth 2 (two) times daily.    Yes [provider]  metroNIDAZOLE (FLAGYL) 500 MG tablet Take 500 mg by mouth 2 (two) times daily. 12/12/20  Yes [provider]  potassium chloride (KLOR-CON) 10 MEQ tablet TAKE 1 TABLET BY MOUTH  TWICE DAILY Patient taking differently: Take 10 mEq by mouth daily. 09/23/20  Yes Deboraha Sprang, MD  ranolazine (RANEXA) 500 MG 12 hr tablet Take 1 tablet (500 mg total) by mouth 2 (two) times daily. 06/01/20  Yes  Deboraha Sprang, MD  traMADol (ULTRAM) 50 MG tablet 1-2 tablets every 6 hours as needed for pain Patient taking differently: Take 50-100 mg by mouth every 6 (six) hours as needed for moderate pain. 12/11/20  Yes Charlesetta Shanks, MD  ranitidine (ZANTAC) 300 MG tablet Take 1 tablet (300 mg total) by mouth at bedtime. Patient not taking: Reported on 12/17/2020 03/08/17   Jiles Prows, MD     Vital Signs: BP (!) 95/54 (BP Location: Left Arm)   Pulse 70   Temp 98.5 F (36.9 C) (Oral)   Resp 18   Ht 6\' 3"  (1.905 m)   Wt 217 lb 8 oz (98.7 kg)   SpO2 95%   BMI 27.19 kg/m    Physical Exam Vitals and nursing note reviewed.  Constitutional:      General: He is not in acute distress. Pulmonary:     Effort: Pulmonary effort is normal. No respiratory distress.  Abdominal:     Comments: LLQ drain site with bloody output partially saturating stat lock, no erythema or tenderness; approximately 10 cc bloody fluid in gravity bag; drain flushes/aspirates without resistance.  Skin:    General: Skin is warm and dry.  Neurological:     Mental Status: He is alert and oriented to person, place, and time.     Imaging: CT ABDOMEN PELVIS W CONTRAST  Result Date: 12/20/2020 CLINICAL DATA:  Abdominal pain. EXAM: CT ABDOMEN AND PELVIS WITH CONTRAST TECHNIQUE: Multidetector CT imaging of the abdomen and pelvis was performed using the standard protocol following bolus administration of intravenous contrast. CONTRAST:  136mL OMNIPAQUE IOHEXOL 300 MG/ML  SOLN COMPARISON:  December 15, 2020. FINDINGS: Lower chest: No acute abnormality. Hepatobiliary: No gallstones or biliary dilatation is noted. 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 Pancreas: Unremarkable. No pancreatic ductal dilatation or surrounding inflammatory changes. Spleen: Normal in size without focal abnormality. Adrenals/Urinary Tract: Adrenal glands appear normal. Left renal cyst is noted. No hydronephrosis or renal obstruction is noted. No renal or ureteral calculi are noted. Urinary bladder is decompressed. Stomach/Bowel: Mild gastric distention is noted. There is significantly worsened small bowel dilatation concerning for obstruction. There is interval development of 10.9 x 6.6 cm fluid collection in the left lower quadrant of the pelvis concerning for abscess. Continued findings are noted consistent with sigmoid diverticulitis which appears to have progressed. Vascular/Lymphatic: Aortic atherosclerosis. No enlarged abdominal or pelvic lymph nodes. Reproductive: Prostate is unremarkable. Other:  Increased amount of free fluid is noted in the pelvis and around the liver. No definite hernia is noted. Musculoskeletal: No acute or significant osseous findings. IMPRESSION: Interval development of 10.9 x 6.6 cm fluid collection in the left lower quadrant and pelvis consistent with paradiverticular abscess. This is consistent with worsening sigmoid diverticulitis. Also noted is significantly increased small bowel dilatation concerning for distal small bowel obstruction, most likely related to diverticulitis. Increased amount of free fluid is noted in the pelvis as well as in the right upper quadrant around the liver. These results will be called to the ordering clinician or representative by the Radiologist Assistant, and communication documented in the PACS or zVision Dashboard. Probable hepatic cirrhosis. Aortic Atherosclerosis (ICD10-I70.0). Electronically Signed   By: Marijo Conception M.D.   On: 12/20/2020 16:35   CT IMAGE GUIDED DRAINAGE BY PERCUTANEOUS CATHETER  Result Date: 12/21/2020 CLINICAL DATA:  Enlarging left pelvic diverticular abscess EXAM: CT GUIDED DRAINAGE OF PELVIC ABSCESS ANESTHESIA/SEDATION: Intravenous Fentanyl 45mcg and Versed 2mg  were administered as conscious sedation  during continuous monitoring of the patient's level of consciousness and physiological / cardiorespiratory status by the radiology RN, with a total moderate sedation time of 13 minutes. PROCEDURE: The procedure, risks, benefits, and alternatives were explained to the patient. Questions regarding the procedure were encouraged and answered. The patient understands and consents to the procedure. Select axial scans through the pelvis were obtained in the left peritoneal collection was localized. An appropriate skin site was determined and marked. The operative field was prepped with chlorhexidinein a sterile fashion, and a sterile drape was applied covering the operative field. A sterile gown and sterile gloves were used for  the procedure. Local anesthesia was provided with 1% Lidocaine. Under CT fluoroscopic guidance, 18 gauge percutaneous entry needle advanced into the collection. Purulent material returned. Amplatz guidewire advanced easily, position confirmed on CT. Tract dilated to facilitate placement 12 French pigtail drain catheter, formed centrally within the collection. CT confirms good catheter position. Catheter secured externally 0 Prolene suture and StatLock and placed to gravity drain bag. 20 mL aspirate sent for Gram stain and culture. The patient tolerated the procedure well. COMPLICATIONS: None immediate FINDINGS: Loculated left lower quadrant pelvic peritoneal collection was localized. 12 French pigtail drain catheter placed as above. Purulent aspirate sample sent for Gram stain and culture. IMPRESSION: Technically successful CT-guided pelvic abscess drain catheter placement. Electronically Signed   By: Lucrezia Europe M.D.   On: 12/21/2020 14:37    Labs:  CBC: Recent Labs    12/20/20 0656 12/21/20 0315 12/22/20 0305 12/23/20 0132  WBC 16.1* 16.8* 8.9 8.4  HGB 13.0 13.5 12.0* 12.3*  HCT 39.2 40.6 35.6* 37.1*  PLT 288 328 283 322    COAGS: Recent Labs    12/11/20 1828 12/16/20 1007 12/17/20 2116 12/18/20 0455 12/19/20 0024 12/20/20 0639 12/21/20 0713  INR 1.9*  --   --   --   --   --  1.3*  APTT  --    < > 123* 102* 92* 80*  --    < > = values in this interval not displayed.    BMP: Recent Labs    12/20/20 0656 12/21/20 0315 12/22/20 0305 12/23/20 0132  NA 142 139 139 139  K 3.8 4.2 4.0 3.7  CL 112* 110 110 108  CO2 25 18* 22 25  GLUCOSE 109* 89 85 92  BUN 18 18 16 12   CALCIUM 9.0 8.9 8.6* 8.5*  CREATININE 1.23 1.09 1.11 1.11  GFRNONAA 59* >60 >60 >60    LIVER FUNCTION TESTS: Recent Labs    06/23/20 1044 12/11/20 1828 12/15/20 1614 12/16/20 0200  BILITOT 0.8 1.6* 1.5* 1.1  AST 23 26 44* 30  ALT 18 24 35 26  ALKPHOS 35* 40 83 54  PROT 6.7 6.4* 6.5 5.0*  ALBUMIN  4.1 3.8 3.1* 2.3*    Assessment and Plan:  History of perforated diverticulitis complicated by development of enlarging left pelvic diverticular abscess s/p LLQ drain placement in IR 12/21/2020. LLQ drain stable with approximately 10 cc bloody fluid in gravity bag (additional 30 cc output from drain in past 24 hours per chart). Statlock of drain partially saturated with bloody output- new statlock sent up to floor, RN notified to change. Continue current drain management- continue with Qshift flushes/monitor of output. Plan for repeat CT/possible drain injection when output <10 cc/day (assess for possible removal). Further plans per [facility]- appreciate and agree with management.ge. IR to follow.   Electronically Signed: Earley Abide, PA-C 12/24/2020, 2:10 PM   I  spent a total of 15 Minutes at the the patient's bedside AND on the patient's hospital floor or unit, greater than 50% of which was counseling/coordinating care for left pelvic diverticular abscess s/p LLQ drain placement.

## 2020-12-25 DIAGNOSIS — K5720 Diverticulitis of large intestine with perforation and abscess without bleeding: Secondary | ICD-10-CM | POA: Diagnosis not present

## 2020-12-25 DIAGNOSIS — L0291 Cutaneous abscess, unspecified: Secondary | ICD-10-CM | POA: Diagnosis not present

## 2020-12-25 DIAGNOSIS — K5792 Diverticulitis of intestine, part unspecified, without perforation or abscess without bleeding: Secondary | ICD-10-CM

## 2020-12-25 DIAGNOSIS — N179 Acute kidney failure, unspecified: Secondary | ICD-10-CM | POA: Diagnosis not present

## 2020-12-25 LAB — HEPARIN LEVEL (UNFRACTIONATED): Heparin Unfractionated: 0.66 IU/mL (ref 0.30–0.70)

## 2020-12-25 MED ORDER — DILTIAZEM HCL ER COATED BEADS 120 MG PO CP24: 120.0000 mg | ORAL_CAPSULE | Freq: Every day | ORAL | 0 refills | Status: DC

## 2020-12-25 MED ORDER — METRONIDAZOLE 500 MG PO TABS: 500.0000 mg | ORAL_TABLET | Freq: Three times a day (TID) | ORAL | 0 refills | Status: DC

## 2020-12-25 MED ORDER — POTASSIUM CHLORIDE CRYS ER 20 MEQ PO TBCR: 20.0000 meq | EXTENDED_RELEASE_TABLET | Freq: Every day | ORAL | 0 refills | Status: DC

## 2020-12-25 MED ORDER — APIXABAN 5 MG PO TABS
5.0000 mg | ORAL_TABLET | Freq: Two times a day (BID) | ORAL | Status: DC
  Administered 2020-12-25: 5 mg via ORAL
  Filled 2020-12-25: qty 1

## 2020-12-25 MED ORDER — PANTOPRAZOLE SODIUM 40 MG PO TBEC
40.0000 mg | DELAYED_RELEASE_TABLET | Freq: Every day | ORAL | Status: DC
  Administered 2020-12-25: 40 mg via ORAL
  Filled 2020-12-25: qty 1

## 2020-12-25 NOTE — Progress Notes (Signed)
Telemetry reviewed, no significant afib with RVR episodes. REmains on tikosyn, diltiazem120mg  daily added back due to some high rates yesterday, can consider coming off at f/u as afib likely should calm down as acute systemic medical issues resolve. Back on eliquis. No further cardiology recs at this time, if remains admitted we will f/u tele tomorrow.   Carlyle Dolly MD

## 2020-12-25 NOTE — Discharge Instructions (Signed)
IR has recommended once daily irrigation of drain with 5 cc sterile normal saline, output recording and dressing changes every 1 to 2 days  You were cared for by a hospitalist during your hospital stay. If you have any questions about your discharge medications or the care you received while you were in the hospital after you are discharged, you can call the unit and asked to speak with the hospitalist on call if the hospitalist that took care of you is not available. Once you are discharged, your primary care physician will handle any further medical issues.   Please note that NO REFILLS for any discharge medications will be authorized once you are discharged, as it is imperative that you return to your primary care physician (or establish a relationship with a primary care physician if you do not have one) for your aftercare needs so that they can reassess your need for medications and monitor your lab values.  Please take all your medications with you for your next visit with your Primary MD. Please ask your Primary MD to get all Hospital records sent to his/her office. Please request your Primary MD to go over all hospital test results at the follow up.   If you experience worsening of your admission symptoms, develop shortness of breath, chest pain, suicidal or homicidal thoughts or a life threatening emergency, you must seek medical attention immediately by calling 911 or calling your MD.   Dennis Bast must read the complete instructions/literature along with all the possible adverse reactions/side effects for all the medicines you take including new medications that have been prescribed to you. Take new medicines after you have completely understood and accpet all the possible adverse reactions/side effects.    Do not drive when taking pain medications or sedatives.     Do not take more than prescribed Pain, Sleep and Anxiety Medications   If you have smoked or chewed Tobacco in the last 2 yrs please  stop. Stop any regular alcohol  and or recreational drug use.   Wear Seat belts while driving.     Diverticulitis  Diverticulitis is infection or inflammation of small pouches (diverticula) in the colon that form due to a condition called diverticulosis. Diverticula can trap stool (feces) and bacteria, causing infection and inflammation. Diverticulitis may cause severe stomach pain and diarrhea. It may lead to tissue damage in the colon that causes bleeding or blockage. The diverticula may also burst (rupture) and cause infected stool to enter other areas of the abdomen. What are the causes? This condition is caused by stool becoming trapped in the diverticula, which allows bacteria to grow in the diverticula. This leads to inflammation and infection. What increases the risk? You are more likely to develop this condition if you have diverticulosis. The risk increases if you:  Are overweight or obese.  Do not get enough exercise.  Drink alcohol.  Use tobacco products.  Eat a diet that has a lot of red meat such as beef, pork, or lamb.  Eat a diet that does not include enough fiber. High-fiber foods include fruits, vegetables, beans, nuts, and whole grains.  Are over 60 years of age. What are the signs or symptoms? Symptoms of this condition may include:  Pain and tenderness in the abdomen. The pain is normally located on the left side of the abdomen, but it may occur in other areas.  Fever and chills.  Nausea.  Vomiting.  Cramping.  Bloating.  Changes in bowel routines.  Blood in  your stool. How is this diagnosed? This condition is diagnosed based on:  Your medical history.  A physical exam.  Tests to make sure there is nothing else causing your condition. These tests may include: ? Blood tests. ? Urine tests. ? CT scan of the abdomen. How is this treated? Most cases of this condition are mild and can be treated at home. Treatment may include:  Taking  over-the-counter pain medicines.  Following a clear liquid diet.  Taking antibiotic medicines by mouth.  Resting. More severe cases may need to be treated at a hospital. Treatment may include:  Not eating or drinking.  Taking prescription pain medicine.  Receiving antibiotic medicines through an IV.  Receiving fluids and nutrition through an IV.  Surgery. When your condition is under control, your health care provider may recommend that you have a colonoscopy. This is an exam to look at the entire large intestine. During the exam, a lubricated, bendable tube is inserted into the anus and then passed into the rectum, colon, and other parts of the large intestine. A colonoscopy can show how severe your diverticula are and whether something else may be causing your symptoms. Follow these instructions at home: Medicines  Take over-the-counter and prescription medicines only as told by your health care provider. These include fiber supplements, probiotics, and stool softeners.  If you were prescribed an antibiotic medicine, take it as told by your health care provider. Do not stop taking the antibiotic even if you start to feel better.  Ask your health care provider if the medicine prescribed to you requires you to avoid driving or using machinery. Eating and drinking  Follow a full liquid diet or another diet as directed by your health care provider.  After your symptoms improve, your health care provider may tell you to change your diet. He or she may recommend that you eat a diet that contains at least 25 grams (25 g) of fiber daily. Fiber makes it easier to pass stool. Healthy sources of fiber include: ? Berries. One cup contains 4-8 grams of fiber. ? Beans or lentils. One-half cup contains 5-8 grams of fiber. ? Green vegetables. One cup contains 4 grams of fiber.  Avoid eating red meat.   General instructions  Do not use any products that contain nicotine or tobacco, such as  cigarettes, e-cigarettes, and chewing tobacco. If you need help quitting, ask your health care provider.  Exercise for at least 30 minutes, 3 times each week. You should exercise hard enough to raise your heart rate and break a sweat.  Keep all follow-up visits as told by your health care provider. This is important. You may need to have a colonoscopy. Contact a health care provider if:  Your pain does not improve.  Your bowel movements do not return to normal. Get help right away if:  Your pain gets worse.  Your symptoms do not get better with treatment.  Your symptoms suddenly get worse.  You have a fever.  You vomit more than one time.  You have stools that are bloody, black, or tarry. Summary  Diverticulitis is infection or inflammation of small pouches (diverticula) in the colon that form due to a condition called diverticulosis. Diverticula can trap stool (feces) and bacteria, causing infection and inflammation.  You are at higher risk for this condition if you have diverticulosis and you eat a diet that does not include enough fiber.  Most cases of this condition are mild and can be treated at  home. More severe cases may need to be treated at a hospital.  When your condition is under control, your health care provider may recommend that you have an exam called a colonoscopy. This exam can show how severe your diverticula are and whether something else may be causing your symptoms.  Keep all follow-up visits as told by your health care provider. This is important. This information is not intended to replace advice given to you by your health care provider. Make sure you discuss any questions you have with your health care provider. Document Revised: 06/09/2019 Document Reviewed: 06/09/2019 Elsevier Patient Education  2021 [place] A soft-food eating plan includes foods that are safe and easy to chew and swallow. Your health care provider or  dietitian can help you find foods and flavors that fit into this plan. Follow this plan until your health care provider or dietitian says it is safe to start eating other foods and food textures. What are tips for following this plan? General guidelines  Take small bites of food, or cut food into pieces about  inch or smaller. Bite-sized pieces of food are easier to chew and swallow.  Eat moist foods. Avoid overly dry foods.  Avoid foods that: ? Are difficult to swallow, such as dry, chunky, crispy, or sticky foods. ? Are difficult to chew, such as hard, tough, or stringy foods. ? Contain nuts, seeds, or fruits.  Follow instructions from your dietitian about the types of liquids that are safe for you to swallow. You may be allowed to have: ? Thick liquids only. This includes only liquids that are thicker than honey. ? Thin and thick liquids. This includes all beverages and foods that become liquid at room temperature.  To make thick liquids: ? Purchase a commercial liquid thickening powder. These are available at grocery stores and pharmacies. ? Mix the thickener into liquids according to instructions on the label. ? Purchase ready-made thickened liquids. ? Thicken soup by pureeing, straining to remove chunks, and adding flour, potato flakes, or corn starch. ? Add commercial thickener to foods that become liquid at room temperature, such as milk shakes, yogurt, ice cream, gelatin, and sherbet.  Ask your health care provider whether you need to take a fiber supplement.   Cooking  Cook meats so they stay tender and moist. Use methods like braising, stewing, or baking in liquid.  Cook vegetables and fruit until they are soft enough to be mashed with a fork.  Peel soft, fresh fruits such as peaches, nectarines, and melons.  When making soup, make sure chunks of meat and vegetables are smaller than  inch.  Reheat leftover foods slowly so that a tough crust does not form. What foods  are allowed? The items listed below may not be a complete list. Talk with your dietitian about what dietary choices are best for you. Grains Breads, muffins, pancakes, or waffles moistened with syrup, jelly, or butter. Dry cereals well-moistened with milk. Moist, cooked cereals. Well-cooked pasta and rice. Vegetables All soft-cooked vegetables. Shredded lettuce. Fruits All canned and cooked fruits. Soft, peeled fresh fruits. Strawberries. Dairy Milk. Cream. Yogurt. Cottage cheese. Soft cheese without the rind. Meats and other protein foods Tender, moist ground meat, poultry, or fish. Meat cooked in gravy or sauces. Eggs. Sweets and desserts Ice cream. Milk shakes. Sherbet. Pudding. Fats and oils Butter. Margarine. Olive, canola, sunflower, and grapeseed oil. Smooth salad dressing. Smooth cream cheese. Mayonnaise. Gravy. What foods are not allowed? The  items listed bemay not be a complete list. Talk with your dietitian about what dietary choices are best for you. Grains Coarse or dry cereals, such as bran, granola, and shredded wheat. Tough or chewy crusty breads, such as [place] bread or baguettes. Breads with nuts, seeds, or fruit. Vegetables All raw vegetables. Cooked corn. Cooked vegetables that are tough or stringy. Tough, crisp, fried potatoes and potato skins. Fruits Fresh fruits with skins or seeds, or both, such as apples, pears, and grapes. Stringy, high-pulp fruits, such as papaya, pineapple, coconut, and mango. Fruit leather and all dried fruit. Dairy Yogurt with nuts or coconut. Meats and other protein foods Hard, dry sausages. Dry meat, poultry, or fish. Meats with gristle. Fish with bones. Fried meat or fish. Lunch meat and hotdogs. Nuts and seeds. Chunky peanut butter or other nut butters. Sweets and desserts Cakes or cookies that are very dry or chewy. Desserts with dried fruit, nuts, or coconut. Fried pastries. Very rich pastries. Fats and oils Cream cheese with fruit  or nuts. Salad dressings with seeds or chunks. Summary  A soft-food eating plan includes foods that are safe and easy to swallow. Generally, the foods should be soft enough to be mashed with a fork.  Avoid foods that are dry, hard to chew, crunchy, sticky, stringy, or crispy.  Ask your health care provider whether you need to thicken your liquids and if you need to take a fiber supplement. This information is not intended to replace advice given to you by your health care provider. Make sure you discuss any questions you have with your health care provider. Document Revised: 12/19/2018 Document Reviewed: 10/31/2016 Elsevier Patient Education  2021 [place].    [email or web address].clinicalkey.com">  Percutaneous Abscess Drain Placement, Care After This sheet gives you information about how to care for yourself after your procedure. Your health care provider may also give you more specific instructions. If you have problems or questions, contact your health care provider. What can I expect after the procedure? After the procedure, it is common to have:  A small amount of bruising and discomfort in the area where the drainage tube (catheter) was placed.  Sleepiness and fatigue. This should go away after the medicines you were given have worn off. Follow these instructions at home: Incision care  Follow instructions from your health care provider about how to take care of your incision. Make sure you: ? Wash your hands with soap and water for at least 20 seconds before and after you change your bandage (dressing). If soap and water are not available, use hand sanitizer. ? Change your dressing as told by your health care provider. ? Leave stitches (sutures), skin glue, or adhesive strips in place. These skin closures may need to stay in place for 2 weeks or longer. If adhesive strip edges start to loosen and curl up, you may trim the loose edges. Do not remove adhesive strips completely unless  your health care provider tells you to do that.  Check your incision area every day for signs of infection. Check for: ? More redness, swelling, or pain. ? More fluid or blood. ? Warmth. ? Pus or a bad smell.   Catheter care  Follow instructions from your health care provider about emptying and cleaning your catheter and collection bag or drainage bulb. You may need to clean the catheter every day so it does not clog.  If directed, write down the following information every time you empty your bag: ? The date and  time. ? The amount of drainage.  Check for fluid leaking from around your catheter (instead of fluid draining through your catheter). This may be a sign that the drain is no longer working correctly. Activity  Rest at home for 1-2 days after your procedure.  Return to your normal activities as told by your health care provider. Ask your health care provider what activities are safe for you.  If you were given a sedative during the procedure, it can affect you for several hours. Do not drive or operate machinery until your health care provider says that it is safe. General instructions  Take over-the-counter and prescription medicines only as told by your health care provider.  If you were prescribed an antibiotic medicine, take it as told by your health care provider. Do not stop using the antibiotic even if you start to feel better.  Do not take showers, take baths, swim, or use a hot tub for 24 hours after your procedure or until your health care provider says that this is okay.  Do not use any products that contain nicotine or tobacco, such as cigarettes, e-cigarettes, and chewing tobacco. If you need help quitting, ask your health care provider.  Keep all follow-up visits as told by your health care provider. This is important. Contact a health care provider if:  You have less than 10 mL of drainage a day for 2-3 days in a row, or as directed by your health care  provider.  You have any of these signs of infection: ? More redness, swelling, or pain around your incision area. ? More fluid or blood coming from your incision area. ? Warmth coming from your incision area. ? Pus or a bad smell coming from your incision area.  You have fluid leaking from around your catheter (instead of through your catheter).  You have a fever or chills.  You have pain that does not get better with medicine. Get help right away if:  Your catheter comes out.  You suddenly stop having drainage from your catheter.  You suddenly have blood in the fluid that is draining from your catheter.  You become dizzy or you faint.  You develop a rash.  You have nausea or vomiting.  You have difficulty breathing or you feel short of breath.  You develop chest pain.  You have problems with your speech or vision.  You have trouble balancing or moving your arms or legs. Summary  It is common to have a small amount of bruising and discomfort in the area where the drainage tube (catheter) was placed.  Follow instructions from your health care provider about emptying and cleaning your catheter and collection bag or drainage bulb.  You may be directed to record the amount of drainage from the bag every time you empty it.  Contact a health care provider if you have more redness, swelling, or pain around your incision area or if you have pain that does not get better with medicine. This information is not intended to replace advice given to you by your health care provider. Make sure you discuss any questions you have with your health care provider. Document Revised: 08/23/2019 Document Reviewed: 08/23/2019 Elsevier Patient Education  2021 [employer].

## 2020-12-25 NOTE — Progress Notes (Signed)
Pt discharged today to home with wife.  Pt left with home use PICC line and closed sytem drain.  Pt sent home with supplies for flushing drain and Pt and wife educated on how to flush and care for drain. Pt taken off telemetry and CCMD notified.  Pt left with all of their personal belongings. AVS documentation reviewed and sent home with Pt and all questions answered.

## 2020-12-25 NOTE — Progress Notes (Signed)
7 Days Post-Op  Subjective: CC: Having bowel function.  Pain stable.  Drain functioning.  Objective: Vital signs in last 24 hours: Temp:  [97.4 F (36.3 C)-98.5 F (36.9 C)] 97.9 F (36.6 C) (04/16 0310) Pulse Rate:  [63-73] 71 (04/16 0310) Resp:  [17-20] 18 (04/16 0310) BP: (95-119)/(54-66) 119/61 (04/16 0310) SpO2:  [93 %-97 %] 95 % (04/16 0310) Weight:  [98.8 kg] 98.8 kg (04/16 0310) Last BM Date: 12/22/20  Intake/Output from previous day: 04/15 0701 - 04/16 0700 In: 245 [P.O.:240] Out: 510 [Urine:500; Drains:10] Intake/Output this shift: No intake/output data recorded.  PE: Gen: Alert, NAD, pleasant Card:irregular Pulm: rate and effort normal on room air ONG:EXBM distension and soft, + BS heard.  LLQ TTP around drain, no peritonitis. IR drain in place with no output currently, except scant serous fluid Psych: A&Ox3  Lab Results:  Recent Labs    12/23/20 0132  WBC 8.4  HGB 12.3*  HCT 37.1*  PLT 322   BMET Recent Labs    12/23/20 0132  NA 139  K 3.7  CL 108  CO2 25  GLUCOSE 92  BUN 12  CREATININE 1.11  CALCIUM 8.5*   PT/INR No results for input(s): LABPROT, INR in the last 72 hours. CMP     Component Value Date/Time   NA 139 12/23/2020 0132   NA 142 07/08/2019 1455   K 3.7 12/23/2020 0132   CL 108 12/23/2020 0132   CO2 25 12/23/2020 0132   GLUCOSE 92 12/23/2020 0132   BUN 12 12/23/2020 0132   BUN 17 07/08/2019 1455   CREATININE 1.11 12/23/2020 0132   CREATININE 1.18 08/27/2015 1518   CALCIUM 8.5 (L) 12/23/2020 0132   PROT 5.0 (L) 12/16/2020 0200   ALBUMIN 2.3 (L) 12/16/2020 0200   AST 30 12/16/2020 0200   ALT 26 12/16/2020 0200   ALKPHOS 54 12/16/2020 0200   BILITOT 1.1 12/16/2020 0200   GFRNONAA >60 12/23/2020 0132   GFRAA 66 07/08/2019 1455   Lipase     Component Value Date/Time   LIPASE 22 12/15/2020 1614       Studies/Results: [place] EKG SITE RITE  Result Date: 12/24/2020 If Site Rite image not attached, placement  could not be confirmed due to current cardiac rhythm.   Anti-infectives: Anti-infectives (From admission, onward)   Start     Dose/Rate Route Frequency Ordered Stop   12/24/20 1630  cefTRIAXone (ROCEPHIN) 2 g in sodium chloride 0.9 % 100 mL IVPB        2 g 200 mL/hr over 30 Minutes Intravenous Every 24 hours 12/24/20 1531     12/24/20 1630  metroNIDAZOLE (FLAGYL) tablet 500 mg        500 mg Oral Every 8 hours 12/24/20 1531     12/24/20 1600  meropenem (MERREM) 1 g in sodium chloride 0.9 % 100 mL IVPB  Status:  Discontinued        1 g 200 mL/hr over 30 Minutes Intravenous Every 8 hours 12/24/20 1453 12/24/20 1531   12/24/20 0000  cefTRIAXone (ROCEPHIN) IVPB        2 g Intravenous Every 24 hours 12/24/20 1658 01/21/21 2359   12/16/20 0600  piperacillin-tazobactam (ZOSYN) IVPB 3.375 g  Status:  Discontinued        3.375 g 12.5 mL/hr over 240 Minutes Intravenous Every 8 hours 12/16/20 0133 12/24/20 1453   12/15/20 2230  piperacillin-tazobactam (ZOSYN) IVPB 3.375 g        3.375 g 100 mL/hr  over 30 Minutes Intravenous  Once 12/15/20 2217 12/16/20 0015       Assessment/Plan Afib on Eliquis(last dose 12/14/20) - hep gtt, tikosyn; afib with RVR s/p [facility] 4/9 Cardiomyopathy, Combined systolic and diastolicCHF, Hx pacemaker placement; LVEF echo 11/2020 40-45% CADwith stent OSA CKD IIIa - Cr 1.75>1.65>1.43>1.23>1.09>1.11, stable/improving  Diverticulitis with perforation and abscess -CT 4/11 showed 10.9 x 6.6 cm fluid collection in the left lower quadrant and pelvis consistent with paradiverticular abscess.  - S/p IR drain placement 4/12. Cx's pending. Gram stain with abundant wbc present, predominantly pmn. There is also rare budding yeast. ATBX per medicine - CT 4/11 also showed dilated small bowel concerning for SBO likely related to diverticular abscess, should improve as diverticulitis improves. - Regular diet - Okay to restart eliquis anticoagulation - Antibiotics per medical  team - Okay for discharge from surgery persepective - Follow up with general surgery team if needed.  We had a discussion of the option to proceed with elective sigmoid colon resection.  This is the patient's first episode of diverticulitis.  After a full discussion of the risks and benefits of surgery, and the risk of recurrent diverticulitis without surgery, the patient would not like to have surgery.  He feels these symptoms have been brewing for a month and he would probably notice earlier in the future now he has been through this experience.  I feel this is reasonable as this is his first episode.  He does need to follow up with his gastroenterologist who will likely recommend a colonoscopy in the coming months.  Our contact information is on the chart if he needs to see me in the future I am happy to meet with him.   LOS: 10 days   Felicie Morn, [facility] 12/25/2020, 8:31 AM Please see Amion for pager number during day hours 7:00am-4:30pm

## 2020-12-25 NOTE — Discharge Summary (Signed)
Physician Discharge Summary  [name] JXM:[id number] DOB: 1940-10-19 DOA: 12/15/2020  PCP: [name], MD  Admit date: 12/15/2020 Discharge date: 12/25/2020  Admitted From: home  Disposition:  home     Home Health:  ordered  Discharge Condition:  stable   CODE STATUS:  Full code   Diet recommendation:  Heart healthy Consultations:  ID  General surgery  IR  Procedures/Studies: . IR drain placement   Discharge Diagnoses:  Principal Problem:   Diverticulitis of colon with perforation and abdominal abscess Active Problems:   Sepsis due to above   Atrial fibrillation with RVR (HCC)   Chronic combined systolic and diastolic CHF (congestive heart failure) (HCC)   Acute renal failure superimposed on stage 3a chronic kidney disease (HCC)   Protein-calorie malnutrition, severe   OBSTRUCTIVE SLEEP APNEA    Brief Summary: [name] is an 80 year old male with A-fib on Eliquis, chronic combined heart failure, CAD, OSA, CKD 3 who presents to the hospital for abdominal pain.  He was diagnosed with diverticulitis on 4/2 and presented to the hospital on 4/6 for increasing left lower quadrant pain despite taking Augmentin.  He is found to have diverticulitis with microperforation.  He was placed on Zosyn and admitted to the hospital.  Hospital Course:  Principal Problem:   Diverticulitis of colon with micro perforation and abscess  -sepsis related to above -Repeat CT scan on 4/11 revealed a 10 x 7 cm abscess -On 4/11 an IR guided drain was placed and per patient- subsequent to this WBC count improved from 16.8 on 4/12 to -8.9 on 4/13 -Abscess culture > E coli resistant to Zosyn - 4/15> antibiotics changed to Ceftriaxone and Flagyl per ID, PICC line placed and home health ordered, discussed plan for drain with Dr Archer Asa- he states that he feels it would be reasonable to dc the patient - they plan to see him in about 2 wks in the drain clinic - 4/16> general surgery  advanced him to solids and cleared him for discharge - he has an appt to f/u with ID  Active Problems: Atrial fibrillation with RVR- paroxysmal  - cardiology consulted -Continued on carvedilol 25 mg twice daily  and Tikosyn - he has required Cardizem to keep his rate controlled- cardiology noted it, hopefully, can be discontinued once his infection resolved - cont Eliquis  AKI on CKD stage III -Creatinine on 12/11/2020 was 1.27-creatinine on admission was noted to be 2.3 -Treated with IV fluids and with holding losartan- it was resumed on  4/14 -CT imaging unrevealing -Creatinine improved to 1.11  Chronic systolic and diastolic heart failure -Echo from 3/22 reveals EF of 40 to 45% and grade 2 diastolic heart failure -compensated     OSA - cont CPAP   Discharge Exam: Vitals:   12/25/20 0845 12/25/20 1215  BP: 129/66 98/61  Pulse: 71 71  Resp: 20 20  Temp: 97.9 F (36.6 C)   SpO2: 95%    Vitals:   12/24/20 2312 12/25/20 0310 12/25/20 0845 12/25/20 1215  BP: 111/66 119/61 129/66 98/61  Pulse: 73 71 71 71  Resp: 17 18 20 20   Temp: 97.7 F (36.5 C) 97.9 F (36.6 C) 97.9 F (36.6 C)   TempSrc: Oral Oral Axillary Oral  SpO2: 93% 95% 95%   Weight:  98.8 kg    Height:        General: Pt is alert, awake, not in acute distress Cardiovascular: RRR, S1/S2 +, no rubs, no gallops Respiratory: CTA  bilaterally, no wheezing, no rhonchi Abdominal: Soft,  mild tenderness in RLQ and around drain, bowel sounds + Extremities: no edema, no cyanosis   Discharge Instructions  Discharge Instructions    [facility] pharmacist to adjust dose for Vancomycin, Aminoglycosides and other anti-infective therapies as requested by physician.   Complete by: As directed    [facility] to provide Cath Flo 2mg    Complete by: As directed    Administer for PICC line occlusion and as ordered by physician for other access device issues.   Anaphylaxis Kit: Provided to treat  any anaphylactic reaction to the medication being provided to the patient if First Dose or when requested by physician   Complete by: As directed    Epinephrine 1mg /ml vial / amp: Administer 0.3mg  (0.12ml) subcutaneously once for moderate to severe anaphylaxis, nurse to call physician and pharmacy when reaction occurs and call 911 if needed for immediate care   Diphenhydramine 50mg /ml IV vial: Administer 25-50mg  IV/IM PRN for first dose reaction, rash, itching, mild reaction, nurse to call physician and pharmacy when reaction occurs   Sodium Chloride 0.9% NS 520ml IV: Administer if needed for hypovolemic blood pressure drop or as ordered by physician after call to physician with anaphylactic reaction   Change dressing on IV access line weekly and PRN   Complete by: As directed    Diet - low sodium heart healthy   Complete by: As directed    Flush IV access with Sodium Chloride 0.9% and Heparin 10 units/ml or 100 units/ml   Complete by: As directed    Home infusion instructions - [facility]   Complete by: As directed    Instructions: Flush IV access with Sodium Chloride 0.9% and Heparin 10units/ml or 100units/ml   Change dressing on IV access line: Weekly and PRN   Instructions Cath Flo 2mg : Administer for PICC Line occlusion and as ordered by physician for other access device   [facility] pharmacist to adjust dose for: Vancomycin, Aminoglycosides and other anti-infective therapies as requested by physician   Increase activity slowly   Complete by: As directed    Method of administration may be changed at the discretion of home infusion pharmacist based upon assessment of the patient and/or caregiver's ability to self-administer the medication ordered   Complete by: As directed    No wound care   Complete by: As directed      Allergies as of 12/25/2020      Reactions   Amiodarone Other (See Comments)   Peripheral neuropathy resulted   Niacin Other (See Comments)    Caused an irregular heartbeat   Ace Inhibitors Cough   Mometasone Furoate Other (See Comments)   (Nasonex) Causes nasal bleeding and nose bleeds      Medication List    STOP taking these medications   amoxicillin-clavulanate 875-125 MG tablet Commonly known as: Augmentin   levofloxacin 500 MG tablet Commonly known as: LEVAQUIN     TAKE these medications   atorvastatin 80 MG tablet Commonly known as: LIPITOR TAKE 1 TABLET BY MOUTH  DAILY   CALCIUM PO Take 600 mg by mouth 2 (two) times daily.   carvedilol 25 MG tablet Commonly known as: COREG TAKE 2 TABLETS BY MOUTH  TWICE DAILY What changed: when to take this   cefTRIAXone  IVPB Commonly known as: ROCEPHIN Inject 2 g into the vein daily for 28 days. Indication:  Paradiverticular abscess  First Dose: Yes Last Day of Therapy:  01/21/21  Labs - Once weekly:  CBC/D and BMP, Labs - Every other week:  ESR and CRP Method of administration: IV Push Method of administration may be changed at the discretion of home infusion pharmacist based upon assessment of the patient and/or caregiver's ability to self-administer the medication ordered.   COQ-10 PO Take 300 mg by mouth daily.   D3 VITAMIN PO Take 4,000 Units by mouth daily.   diltiazem 120 MG 24 hr capsule Commonly known as: CARDIZEM CD Take 1 capsule (120 mg total) by mouth daily. Start taking on: December 26, 2020   dofetilide 500 MCG capsule Commonly known as: TIKOSYN TAKE 1 CAPSULE BY MOUTH  TWICE DAILY   Eliquis 5 MG Tabs tablet Generic drug: apixaban TAKE 1 TABLET BY MOUTH  TWICE DAILY What changed: how much to take   fenofibrate 160 MG tablet TAKE 1 TABLET BY MOUTH  DAILY   fexofenadine 180 MG tablet Commonly known as: ALLEGRA Take 180 mg by mouth daily.   fluocinonide-emollient 0.05 % cream Commonly known as: LIDEX-E APPLY TO AFFECTED AREA(S)  TWO TIMES DAILY AS NEEDED   folic acid 614 MCG tablet Commonly known as: FOLVITE Take 400 mcg by mouth  daily.   losartan 25 MG tablet Commonly known as: COZAAR TAKE 1 TABLET BY MOUTH  DAILY What changed:   how much to take  when to take this   Lysine 500 MG Tabs Take 500 mg by mouth 3 (three) times daily.   Magnesium 250 MG Tabs Take 250 mg by mouth 2 (two) times daily.   metroNIDAZOLE 500 MG tablet Commonly known as: FLAGYL Take 1 tablet (500 mg total) by mouth every 8 (eight) hours. What changed: when to take this   potassium chloride 10 MEQ tablet Commonly known as: KLOR-CON TAKE 1 TABLET BY MOUTH  TWICE DAILY What changed: when to take this   ranitidine 300 MG tablet Commonly known as: ZANTAC Take 1 tablet (300 mg total) by mouth at bedtime.   ranolazine 500 MG 12 hr tablet Commonly known as: RANEXA Take 1 tablet (500 mg total) by mouth 2 (two) times daily.   traMADol 50 MG tablet Commonly known as: ULTRAM 1-2 tablets every 6 hours as needed for pain What changed:   how much to take  how to take this  when to take this  reasons to take this  additional instructions   Vitamin B 12 500 MCG Tabs Take 500 mcg by mouth 3 (three) times a week.            Discharge Care Instructions  (From admission, onward)         Start     Ordered   12/24/20 0000  Change dressing on IV access line weekly and PRN  (Home infusion instructions - [facility] )        12/24/20 1658          Follow-up Information    Biagio Borg, MD Follow up.   Specialties: Internal Medicine, Radiology Contact information: [place] [address] [phone]        Arne Cleveland, MD Follow up.   Specialties: Interventional Radiology, Radiology Why: Please call to schedule a follow up appointment for drain clinic Contact information: [address] [place] [address] [phone]        Stechschulte, Nickola Major, MD Follow up.   Specialty: Surgery Why: Follow up with the surgery office if needed Contact information: [address] [place] [address] [phone]        Care, [facility] Follow up.   Specialty: Home Health Services Why: Registered Nurse- Alvis Lemmings will call and first visit will be on Sunday between 2-4:00pm.  Contact information: [address] [place] [address] [phone]              Allergies  Allergen Reactions  . Amiodarone Other (See Comments)    Peripheral neuropathy resulted  . Niacin Other (See Comments)    Caused an irregular heartbeat  . Ace Inhibitors Cough  . Mometasone Furoate Other (See Comments)    (Nasonex) Causes nasal bleeding and nose bleeds      CT ABDOMEN PELVIS WO CONTRAST  Result Date: 12/15/2020 CLINICAL DATA:  Acute abdominal pain. Diagnosed with diverticulitis on Saturday and lower abdominal pain since then. EXAM: CT ABDOMEN AND PELVIS WITHOUT CONTRAST TECHNIQUE: Multidetector CT imaging of the abdomen and pelvis was performed following the standard protocol without IV contrast. COMPARISON:  12/11/2020 FINDINGS: Lower chest: Mild dependent changes in the lung bases. Cardiac enlargement. Hepatobiliary: Probable hepatic cirrhosis with atrophy of the medial segment left lobe and nodular contour to the liver. Gallbladder and bile ducts are unremarkable. Pancreas: Diffuse fatty replacement of the pancreas. No inflammatory changes or ductal dilatation. Spleen: Normal in size without focal abnormality. Adrenals/Urinary Tract: Adrenal glands are unremarkable. Kidneys are normal, without renal calculi, focal lesion, or hydronephrosis. Bladder is unremarkable. Stomach/Bowel: Stomach is not abnormally distended. Significant progression of previous inflammatory process in the pelvis. This likely represents progression of the diverticulitis. There is extensive inflammatory stranding now seen throughout the pelvic fat with dilated fluid-filled small bowel, possibly due to small-bowel obstruction or reactive change. There is new free  intra-abdominal air consistent with perforation. No loculated abscess collection is identified. Vascular/Lymphatic: Aortic atherosclerosis. No enlarged abdominal or pelvic lymph nodes. Reproductive: Prostate gland is not enlarged. Other: Small amount of free fluid in the abdomen. Abdominal wall musculature appears intact. Musculoskeletal: Bilateral hip arthroplasties. Degenerative changes in the spine. IMPRESSION: 1. Significant progression of previous inflammatory process in the pelvis likely representing progression of the diverticulitis. New free intra-abdominal air consistent with perforation. Inflammatory stranding and edema throughout the pelvic fat. Developing small bowel dilatation with fluid-filled loops, probably reactive. No loculated abscess collection is identified. 2. Probable hepatic cirrhosis. 3. Small amount of free fluid in the abdomen. 4. Aortic atherosclerosis. Aortic Atherosclerosis (ICD10-I70.0). Critical Value/emergent results were called by telephone at the time of interpretation on 12/15/2020 at 8:39 pm to provider [facility] , who verbally acknowledged these results. Electronically Signed   By: Lucienne Capers M.D.   On: 12/15/2020 20:44   CT ABDOMEN PELVIS W CONTRAST  Result Date: 12/20/2020 CLINICAL DATA:  Abdominal pain. EXAM: CT ABDOMEN AND PELVIS WITH CONTRAST TECHNIQUE: Multidetector CT imaging of the abdomen and pelvis was performed using the standard protocol following bolus administration of intravenous contrast. CONTRAST:  126mL OMNIPAQUE IOHEXOL 300 MG/ML  SOLN COMPARISON:  December 15, 2020. FINDINGS: Lower chest: No acute abnormality. Hepatobiliary: No gallstones or biliary dilatation is noted. 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 Pancreas: Unremarkable. No pancreatic ductal dilatation or surrounding inflammatory changes. Spleen: Normal in size without focal abnormality. Adrenals/Urinary Tract: Adrenal glands appear normal. Left renal cyst is  noted. No hydronephrosis or renal obstruction is noted. No renal or ureteral calculi are noted. Urinary bladder is decompressed. Stomach/Bowel: Mild gastric distention is noted. There is significantly worsened small bowel  dilatation concerning for obstruction. There is interval development of 10.9 x 6.6 cm fluid collection in the left lower quadrant of the pelvis concerning for abscess. Continued findings are noted consistent with sigmoid diverticulitis which appears to have progressed. Vascular/Lymphatic: Aortic atherosclerosis. No enlarged abdominal or pelvic lymph nodes. Reproductive: Prostate is unremarkable. Other: Increased amount of free fluid is noted in the pelvis and around the liver. No definite hernia is noted. Musculoskeletal: No acute or significant osseous findings. IMPRESSION: Interval development of 10.9 x 6.6 cm fluid collection in the left lower quadrant and pelvis consistent with paradiverticular abscess. This is consistent with worsening sigmoid diverticulitis. Also noted is significantly increased small bowel dilatation concerning for distal small bowel obstruction, most likely related to diverticulitis. Increased amount of free fluid is noted in the pelvis as well as in the right upper quadrant around the liver. These results will be called to the ordering clinician or representative by the Radiologist Assistant, and communication documented in the PACS or zVision Dashboard. Probable hepatic cirrhosis. Aortic Atherosclerosis (ICD10-I70.0). Electronically Signed   By: Marijo Conception M.D.   On: 12/20/2020 16:35   CT Abdomen Pelvis W Contrast  Result Date: 12/11/2020 CLINICAL DATA:  Left groin pain EXAM: CT ABDOMEN AND PELVIS WITH CONTRAST TECHNIQUE: Multidetector CT imaging of the abdomen and pelvis was performed using the standard protocol following bolus administration of intravenous contrast. CONTRAST:  179mL OMNIPAQUE IOHEXOL 300 MG/ML  SOLN COMPARISON:  None. FINDINGS: Lower chest:  Cardiomegaly. Pacer wires noted in the right heart. Diffuse calcifications in the visualized right coronary artery. Hepatobiliary: No focal hepatic abnormality. Gallbladder unremarkable. Pancreas: No focal abnormality or ductal dilatation. Spleen: No focal abnormality.  Normal size. Adrenals/Urinary Tract: Left renal parapelvic cysts and small cyst in the lower pole of the left kidney. No hydronephrosis. Adrenal glands and urinary bladder unremarkable. Stomach/Bowel: Scattered diverticulosis, most pronounced in the descending and sigmoid colon. Inflammatory stranding around the proximal sigmoid colon compatible with active diverticulitis. Stomach and small bowel decompressed. Normal appendix. Vascular/Lymphatic: Aortoiliac atherosclerosis. No evidence of aneurysm or adenopathy. Reproductive: Central prostate calcifications. Other: No free fluid or free air. Musculoskeletal: Bilateral hip replacements. No acute bony abnormality. IMPRESSION: Colonic diverticulosis. Changes of active diverticulitis around the proximal sigmoid colon. Coronary artery disease, aortic atherosclerosis. Electronically Signed   By: Rolm Baptise M.D.   On: 12/11/2020 21:05   DG CHEST PORT 1 VIEW  Result Date: 12/20/2020 CLINICAL DATA:  Shortness of breath EXAM: PORTABLE CHEST 1 VIEW COMPARISON:  12/15/2020 FINDINGS: Cardiomegaly, vascular congestion. Bibasilar opacities, likely atelectasis. Left pacer remains in place, unchanged. No acute bony abnormality. IMPRESSION: Cardiomegaly with vascular congestion and bibasilar atelectasis. Electronically Signed   By: Rolm Baptise M.D.   On: 12/20/2020 09:24   DG Chest Portable 1 View  Result Date: 12/15/2020 CLINICAL DATA:  Shortness of breath.  Diverticulitis. EXAM: PORTABLE CHEST 1 VIEW COMPARISON:  04/19/2016 FINDINGS: Mild cardiac enlargement. Shallow inspiration with linear atelectasis in the lung bases. No consolidation or airspace disease. No pleural effusions. No pneumothorax. Cardiac  pacemaker. IMPRESSION: Shallow inspiration with linear atelectasis in the lung bases. Electronically Signed   By: Lucienne Capers M.D.   On: 12/15/2020 23:22   DG Abd Portable 1V  Result Date: 12/20/2020 CLINICAL DATA:  Distended abdomen EXAM: PORTABLE ABDOMEN - 1 VIEW COMPARISON:  CT abdomen pelvis 12/15/2020 FINDINGS: Diffusely distended large and small bowel loops.  Distended stomach. No abnormal calcifications.  Bilateral hip replacement. IMPRESSION: Mildly distended large and small bowel loops most  consistent with ileus. Electronically Signed   By: Franchot Gallo M.D.   On: 12/20/2020 09:27   CT IMAGE GUIDED DRAINAGE BY PERCUTANEOUS CATHETER  Result Date: 12/21/2020 CLINICAL DATA:  Enlarging left pelvic diverticular abscess EXAM: CT GUIDED DRAINAGE OF PELVIC ABSCESS ANESTHESIA/SEDATION: Intravenous Fentanyl 29mcg and Versed $RemoveBe'2mg'[id number]$  were administered as conscious sedation during continuous monitoring of the patient's level of consciousness and physiological / cardiorespiratory status by the radiology RN, with a total moderate sedation time of 13 minutes. PROCEDURE: The procedure, risks, benefits, and alternatives were explained to the patient. Questions regarding the procedure were encouraged and answered. The patient understands and consents to the procedure. Select axial scans through the pelvis were obtained in the left peritoneal collection was localized. An appropriate skin site was determined and marked. The operative field was prepped with chlorhexidinein a sterile fashion, and a sterile drape was applied covering the operative field. A sterile gown and sterile gloves were used for the procedure. Local anesthesia was provided with 1% Lidocaine. Under CT fluoroscopic guidance, 18 gauge percutaneous entry needle advanced into the collection. Purulent material returned. Amplatz guidewire advanced easily, position confirmed on CT. Tract dilated to facilitate placement 12 French pigtail drain catheter,  formed centrally within the collection. CT confirms good catheter position. Catheter secured externally 0 Prolene suture and StatLock and placed to gravity drain bag. 20 mL aspirate sent for Gram stain and culture. The patient tolerated the procedure well. COMPLICATIONS: None immediate FINDINGS: Loculated left lower quadrant pelvic peritoneal collection was localized. 12 French pigtail drain catheter placed as above. Purulent aspirate sample sent for Gram stain and culture. IMPRESSION: Technically successful CT-guided pelvic abscess drain catheter placement. Electronically Signed   By: Lucrezia Europe M.D.   On: 12/21/2020 14:37   [place] EKG SITE RITE  Result Date: 12/24/2020 If Site Rite image not attached, placement could not be confirmed due to current cardiac rhythm.     The results of significant diagnostics from this hospitalization (including imaging, microbiology, ancillary and laboratory) are listed below for reference.     Microbiology: Recent Results (from the past 240 hour(s))  Resp Panel by RT-PCR (Flu A&B, Covid) Nasopharyngeal Swab     Status: None   Collection Time: 12/15/20 10:21 PM   Specimen: Nasopharyngeal Swab; Nasopharyngeal(NP) swabs in vial transport medium  Result Value Ref Range Status   SARS Coronavirus 2 by RT PCR NEGATIVE NEGATIVE Final    Comment: (NOTE) SARS-CoV-2 target nucleic acids are NOT DETECTED.  The SARS-CoV-2 RNA is generally detectable in upper respiratory specimens during the acute phase of infection. The lowest concentration of SARS-CoV-2 viral copies this assay can detect is 138 copies/mL. A negative result does not preclude SARS-Cov-2 infection and should not be used as the sole basis for treatment or other patient management decisions. A negative result may occur with  improper specimen collection/handling, submission of specimen other than nasopharyngeal swab, presence of viral mutation(s) within the areas targeted by this assay, and inadequate  number of viral copies(<138 copies/mL). A negative result must be combined with clinical observations, patient history, and epidemiological information. The expected result is Negative.  Fact Sheet for Patients:  EntrepreneurPulse.com.au  Fact Sheet for Healthcare Providers:  IncredibleEmployment.be  This test is no t yet approved or cleared by the [place] FDA and  has been authorized for detection and/or diagnosis of SARS-CoV-2 by FDA under an Emergency Use Authorization (EUA). This EUA will remain  in effect (meaning this test can be used) for the duration of  the COVID-19 declaration under Section 564(b)(1) of the Act, 21 U.S.C.section 360bbb-3(b)(1), unless the authorization is terminated  or revoked sooner.       Influenza A by PCR NEGATIVE NEGATIVE Final   Influenza B by PCR NEGATIVE NEGATIVE Final    Comment: (NOTE) The Xpert Xpress SARS-CoV-2/FLU/RSV plus assay is intended as an aid in the diagnosis of influenza from Nasopharyngeal swab specimens and should not be used as a sole basis for treatment. Nasal washings and aspirates are unacceptable for Xpert Xpress SARS-CoV-2/FLU/RSV testing.  Fact Sheet for Patients: EntrepreneurPulse.com.au  Fact Sheet for Healthcare Providers: IncredibleEmployment.be  This test is not yet approved or cleared by the [place] FDA and has been authorized for detection and/or diagnosis of SARS-CoV-2 by FDA under an Emergency Use Authorization (EUA). This EUA will remain in effect (meaning this test can be used) for the duration of the COVID-19 declaration under Section 564(b)(1) of the Act, 21 U.S.C. section 360bbb-3(b)(1), unless the authorization is terminated or revoked.  Performed at [facility], [place] [address], [place], [place] [address]   Blood culture (routine x 2)     Status: None   Collection Time: 12/15/20 10:34 PM   Specimen: BLOOD   Result Value Ref Range Status   Specimen Description BLOOD RIGHT FOREARM  Final   Special Requests   Final    BOTTLES DRAWN AEROBIC AND ANAEROBIC Blood Culture results may not be optimal due to an inadequate volume of blood received in culture bottles   Culture   Final    NO GROWTH 5 DAYS Performed at [facility], [place] [address], [place], [place] [address]    Report Status 12/21/2020 FINAL  Final  Blood culture (routine x 2)     Status: None   Collection Time: 12/15/20 11:32 PM   Specimen: BLOOD  Result Value Ref Range Status   Specimen Description BLOOD RIGHT ARM  Final   Special Requests   Final    BOTTLES DRAWN AEROBIC AND ANAEROBIC Blood Culture adequate volume   Culture   Final    NO GROWTH 5 DAYS Performed at [facility], [address] [place], [place] [address]    Report Status 12/21/2020 FINAL  Final  MRSA PCR Screening     Status: None   Collection Time: 12/18/20  4:45 AM   Specimen: Nasal Mucosa; Nasopharyngeal  Result Value Ref Range Status   MRSA by PCR NEGATIVE NEGATIVE Final    Comment:        The GeneXpert MRSA Assay (FDA approved for NASAL specimens only), is one component of a comprehensive MRSA colonization surveillance program. It is not intended to diagnose MRSA infection nor to guide or monitor treatment for MRSA infections. Performed at [facility], [place] [address] [place], [place] [address]   Aerobic/Anaerobic Culture (surgical/deep wound)     Status: None (Preliminary result)   Collection Time: 12/21/20 10:38 AM   Specimen: Abscess  Result Value Ref Range Status   Specimen Description ABSCESS DRAIN  Final   Special Requests Normal  Final   Gram Stain   Final    ABUNDANT WBC PRESENT, PREDOMINANTLY PMN RARE BUDDING YEAST SEEN Performed at [facility], [place] [address] [place], [place] [address]    Culture   Final    ABUNDANT ESCHERICHIA COLI NO ANAEROBES ISOLATED; CULTURE IN PROGRESS FOR 5 DAYS    Report Status  PENDING  Incomplete   Organism ID, Bacteria ESCHERICHIA COLI  Final      Susceptibility   Escherichia coli - MIC*    AMPICILLIN >=32 RESISTANT Resistant     CEFAZOLIN 16 SENSITIVE Sensitive     CEFEPIME <=0.12 SENSITIVE Sensitive     CEFTAZIDIME <=1 SENSITIVE Sensitive     CEFTRIAXONE <=0.25 SENSITIVE Sensitive     CIPROFLOXACIN >=4 RESISTANT Resistant     GENTAMICIN <=1 SENSITIVE Sensitive     IMIPENEM <=0.25 SENSITIVE Sensitive     TRIMETH/SULFA <=20 SENSITIVE Sensitive     AMPICILLIN/SULBACTAM >=32 RESISTANT Resistant     PIP/TAZO >=128 RESISTANT Resistant     * ABUNDANT ESCHERICHIA COLI     Labs: BNP (last 3 results) No results for input(s): BNP in the last 8760 hours. Basic Metabolic Panel: Recent Labs  Lab 12/19/20 0024 12/20/20 0656 12/21/20 0315 12/22/20 0305 12/23/20 0132  NA 139 142 139 139 139  K 4.1 3.8 4.2 4.0 3.7  CL 111 112* 110 110 108  CO2 22 25 18* 22 25  GLUCOSE 129* 109* 89 85 92  BUN 27* $Remov'18 18 16 12  'rQvOGl$ CREATININE 1.29* 1.23 1.09 1.11 1.11  CALCIUM 8.7* 9.0 8.9 8.6* 8.5*  MG 2.1  --   --   --   --    Liver Function Tests: No results for input(s): AST, ALT, ALKPHOS, BILITOT, PROT, ALBUMIN in the last 168 hours. No results for input(s): LIPASE, AMYLASE in the last 168 hours. No results for input(s): AMMONIA in the last 168 hours. CBC: Recent Labs  Lab 12/19/20 0024 12/20/20 0656 12/21/20 0315 12/22/20 0305 12/23/20 0132  WBC 15.3* 16.1* 16.8* 8.9 8.4  HGB 13.3 13.0 13.5 12.0* 12.3*  HCT 39.5 39.2 40.6 35.6* 37.1*  MCV 96.6 99.2 101.2* 98.9 98.9  PLT 299 288 328 283 322   Cardiac Enzymes: No results for input(s): CKTOTAL, CKMB, CKMBINDEX, TROPONINI in the last 168 hours. BNP: Invalid input(s): POCBNP CBG: No results for input(s): GLUCAP in the last 168 hours. D-Dimer No results for input(s): DDIMER in the last 72 hours. Hgb A1c No results for input(s): HGBA1C in the last 72 hours. Lipid Profile No results for input(s): CHOL, HDL,  LDLCALC, TRIG, CHOLHDL, LDLDIRECT in the last 72 hours. Thyroid function studies No results for input(s): TSH, T4TOTAL, T3FREE, THYROIDAB in the last 72 hours.  Invalid input(s): FREET3 Anemia work up No results for input(s): VITAMINB12, FOLATE, FERRITIN, TIBC, IRON, RETICCTPCT in the last 72 hours. Urinalysis    Component Value Date/Time   COLORURINE AMBER (A) 12/15/2020 1614   APPEARANCEUR HAZY (A) 12/15/2020 1614   LABSPEC 1.024 12/15/2020 1614   PHURINE 5.0 12/15/2020 1614   GLUCOSEU NEGATIVE 12/15/2020 1614   GLUCOSEU NEGATIVE 06/23/2020 1044   HGBUR NEGATIVE 12/15/2020 1614   BILIRUBINUR NEGATIVE 12/15/2020 1614   KETONESUR 5 (A) 12/15/2020 1614   PROTEINUR NEGATIVE 12/15/2020 1614   UROBILINOGEN 1.0 06/23/2020 1044   NITRITE NEGATIVE 12/15/2020 1614   LEUKOCYTESUR TRACE (A) 12/15/2020 1614   Sepsis Labs Invalid input(s): PROCALCITONIN,  WBC,  LACTICIDVEN Microbiology Recent Results (from the past 240 hour(s))  Resp Panel by RT-PCR (Flu A&B, Covid) Nasopharyngeal Swab     Status: None   Collection Time: 12/15/20 10:21 PM   Specimen: Nasopharyngeal Swab; Nasopharyngeal(NP) swabs in vial transport medium  Result Value Ref Range Status   SARS Coronavirus 2 by RT PCR NEGATIVE NEGATIVE Final    Comment: (NOTE) SARS-CoV-2 target nucleic acids are NOT DETECTED.  The SARS-CoV-2 RNA is generally detectable in upper respiratory specimens during  the acute phase of infection. The lowest concentration of SARS-CoV-2 viral copies this assay can detect is 138 copies/mL. A negative result does not preclude SARS-Cov-2 infection and should not be used as the sole basis for treatment or other patient management decisions. A negative result may occur with  improper specimen collection/handling, submission of specimen other than nasopharyngeal swab, presence of viral mutation(s) within the areas targeted by this assay, and inadequate number of viral copies(<138 copies/mL). A negative  result must be combined with clinical observations, patient history, and epidemiological information. The expected result is Negative.  Fact Sheet for Patients:  EntrepreneurPulse.com.au  Fact Sheet for Healthcare Providers:  IncredibleEmployment.be  This test is no t yet approved or cleared by the [place] FDA and  has been authorized for detection and/or diagnosis of SARS-CoV-2 by FDA under an Emergency Use Authorization (EUA). This EUA will remain  in effect (meaning this test can be used) for the duration of the COVID-19 declaration under Section 564(b)(1) of the Act, 21 U.S.C.section 360bbb-3(b)(1), unless the authorization is terminated  or revoked sooner.       Influenza A by PCR NEGATIVE NEGATIVE Final   Influenza B by PCR NEGATIVE NEGATIVE Final    Comment: (NOTE) The Xpert Xpress SARS-CoV-2/FLU/RSV plus assay is intended as an aid in the diagnosis of influenza from Nasopharyngeal swab specimens and should not be used as a sole basis for treatment. Nasal washings and aspirates are unacceptable for Xpert Xpress SARS-CoV-2/FLU/RSV testing.  Fact Sheet for Patients: EntrepreneurPulse.com.au  Fact Sheet for Healthcare Providers: IncredibleEmployment.be  This test is not yet approved or cleared by the [place] FDA and has been authorized for detection and/or diagnosis of SARS-CoV-2 by FDA under an Emergency Use Authorization (EUA). This EUA will remain in effect (meaning this test can be used) for the duration of the COVID-19 declaration under Section 564(b)(1) of the Act, 21 U.S.C. section 360bbb-3(b)(1), unless the authorization is terminated or revoked.  Performed at [facility], [place] [address], [place], [place] [address]   Blood culture (routine x 2)     Status: None   Collection Time: 12/15/20 10:34 PM   Specimen: BLOOD  Result Value Ref Range Status   Specimen Description  BLOOD RIGHT FOREARM  Final   Special Requests   Final    BOTTLES DRAWN AEROBIC AND ANAEROBIC Blood Culture results may not be optimal due to an inadequate volume of blood received in culture bottles   Culture   Final    NO GROWTH 5 DAYS Performed at [facility], [place] [address], [place], [place] [address]    Report Status 12/21/2020 FINAL  Final  Blood culture (routine x 2)     Status: None   Collection Time: 12/15/20 11:32 PM   Specimen: BLOOD  Result Value Ref Range Status   Specimen Description BLOOD RIGHT ARM  Final   Special Requests   Final    BOTTLES DRAWN AEROBIC AND ANAEROBIC Blood Culture adequate volume   Culture   Final    NO GROWTH 5 DAYS Performed at [facility], [address], [place],  [address]    Report Status 12/21/2020 FINAL  Final  MRSA PCR Screening     Status: None   Collection Time: 12/18/20  4:45 AM   Specimen: Nasal Mucosa; Nasopharyngeal  Result Value Ref Range Status   MRSA by PCR NEGATIVE NEGATIVE Final    Comment:        The GeneXpert  MRSA Assay (FDA approved for NASAL specimens only), is one component of a comprehensive MRSA colonization surveillance program. It is not intended to diagnose MRSA infection nor to guide or monitor treatment for MRSA infections. Performed at [facility], [place] [address] [place], [place] [address]   Aerobic/Anaerobic Culture (surgical/deep wound)     Status: None (Preliminary result)   Collection Time: 12/21/20 10:38 AM   Specimen: Abscess  Result Value Ref Range Status   Specimen Description ABSCESS DRAIN  Final   Special Requests Normal  Final   Gram Stain   Final    ABUNDANT WBC PRESENT, PREDOMINANTLY PMN RARE BUDDING YEAST SEEN Performed at [facility], [place] [address] [place], [place] [address]    Culture   Final    ABUNDANT ESCHERICHIA COLI NO ANAEROBES ISOLATED; CULTURE IN PROGRESS FOR 5 DAYS    Report Status PENDING  Incomplete   Organism ID, Bacteria ESCHERICHIA  COLI  Final      Susceptibility   Escherichia coli - MIC*    AMPICILLIN >=32 RESISTANT Resistant     CEFAZOLIN 16 SENSITIVE Sensitive     CEFEPIME <=0.12 SENSITIVE Sensitive     CEFTAZIDIME <=1 SENSITIVE Sensitive     CEFTRIAXONE <=0.25 SENSITIVE Sensitive     CIPROFLOXACIN >=4 RESISTANT Resistant     GENTAMICIN <=1 SENSITIVE Sensitive     IMIPENEM <=0.25 SENSITIVE Sensitive     TRIMETH/SULFA <=20 SENSITIVE Sensitive     AMPICILLIN/SULBACTAM >=32 RESISTANT Resistant     PIP/TAZO >=128 RESISTANT Resistant     * ABUNDANT ESCHERICHIA COLI     Time coordinating discharge in minutes: 65  SIGNED:   Debbe Odea, MD  [facility] 12/25/2020, 12:44 PM

## 2020-12-25 NOTE — Progress Notes (Signed)
Patient ID: [name], male   DOB: [birth year], 80 y.o.   MRN: [id number]    Referring Physician(s): Patel,P/Stechschulte,P  Supervising Physician: Mir, Sharen Heck  Patient Status:  [facility] - In-pt  Chief Complaint: Abdominal pain/diverticular abscess   Subjective: Patient states that he may be discharged home today.  Currently without new complaints.  Has some minimal soreness at left lower quadrant drain site, denies nausea vomiting.   Allergies: Amiodarone, Niacin, Ace inhibitors, and Mometasone furoate  Medications: Prior to Admission medications   Medication Sig Start Date End Date Taking? Authorizing Provider  amoxicillin-clavulanate (AUGMENTIN) 875-125 MG tablet Take 1 tablet by mouth 2 (two) times daily. One po bid x 7 days 12/11/20  Yes Pfeiffer, Jeannie Done, MD  atorvastatin (LIPITOR) 80 MG tablet TAKE 1 TABLET BY MOUTH  DAILY Patient taking differently: Take 80 mg by mouth daily. 09/23/20  Yes Deboraha Sprang, MD  CALCIUM PO Take 600 mg by mouth 2 (two) times daily.   Yes [provider]  carvedilol (COREG) 25 MG tablet TAKE 2 TABLETS BY MOUTH  TWICE DAILY Patient taking differently: Take 50 mg by mouth 2 (two) times daily with a meal. 09/23/20  Yes Deboraha Sprang, MD  cefTRIAXone (ROCEPHIN) IVPB Inject 2 g into the vein daily for 28 days. Indication:  Paradiverticular abscess  First Dose: Yes Last Day of Therapy:  01/21/21 Labs - Once weekly:  CBC/D and BMP, Labs - Every other week:  ESR and CRP Method of administration: IV Push Method of administration may be changed at the discretion of home infusion pharmacist based upon assessment of the patient and/or caregiver's ability to self-administer the medication ordered. 12/24/20 01/21/21 Yes Debbe Odea, MD  Cholecalciferol (D3 VITAMIN PO) Take 4,000 Units by mouth daily.   Yes [provider]  Coenzyme Q10 (COQ-10 PO) Take 300 mg by mouth daily.   Yes [provider]  Cyanocobalamin (VITAMIN B 12) 500  MCG TABS Take 500 mcg by mouth 3 (three) times a week.   Yes [provider]  dofetilide (TIKOSYN) 500 MCG capsule TAKE 1 CAPSULE BY MOUTH  TWICE DAILY Patient taking differently: Take 500 mcg by mouth 2 (two) times daily. 09/23/20  Yes Deboraha Sprang, MD  ELIQUIS 5 MG TABS tablet TAKE 1 TABLET BY MOUTH  TWICE DAILY Patient taking differently: Take 5 mg by mouth 2 (two) times daily. 09/23/20  Yes Deboraha Sprang, MD  fenofibrate 160 MG tablet TAKE 1 TABLET BY MOUTH  DAILY Patient taking differently: Take 160 mg by mouth daily. 09/23/20  Yes Deboraha Sprang, MD  fexofenadine (ALLEGRA) 180 MG tablet Take 180 mg by mouth daily.   Yes [provider]  Fluocinonide Emulsified Base 0.05 % CREA APPLY TO AFFECTED AREA(S)  TWO TIMES DAILY AS NEEDED 01/16/19  Yes Biagio Borg, MD  folic acid (FOLVITE) 409 MCG tablet Take 400 mcg by mouth daily.   Yes [provider]  levofloxacin (LEVAQUIN) 500 MG tablet Take 500 mg by mouth daily. 12/12/20  Yes [provider]  losartan (COZAAR) 25 MG tablet TAKE 1 TABLET BY MOUTH  DAILY Patient taking differently: Take 12.5 mg by mouth in the morning and at bedtime. 09/23/20  Yes Deboraha Sprang, MD  Lysine 500 MG TABS Take 500 mg by mouth 3 (three) times daily.   Yes [provider]  Magnesium 250 MG TABS Take 250 mg by mouth 2 (two) times daily.    Yes [provider]  metroNIDAZOLE (  FLAGYL) 500 MG tablet Take 500 mg by mouth 2 (two) times daily. 12/12/20  Yes [provider]  potassium chloride (KLOR-CON) 10 MEQ tablet TAKE 1 TABLET BY MOUTH  TWICE DAILY Patient taking differently: Take 10 mEq by mouth daily. 09/23/20  Yes Deboraha Sprang, MD  ranolazine (RANEXA) 500 MG 12 hr tablet Take 1 tablet (500 mg total) by mouth 2 (two) times daily. 06/01/20  Yes Deboraha Sprang, MD  traMADol (ULTRAM) 50 MG tablet 1-2 tablets every 6 hours as needed for pain Patient taking differently: Take 50-100 mg by mouth every 6 (six)  hours as needed for moderate pain. 12/11/20  Yes Charlesetta Shanks, MD  diltiazem (CARDIZEM CD) 120 MG 24 hr capsule Take 1 capsule (120 mg total) by mouth daily. 12/26/20   Debbe Odea, MD  metroNIDAZOLE (FLAGYL) 500 MG tablet Take 1 tablet (500 mg total) by mouth every 8 (eight) hours. 12/25/20   Debbe Odea, MD  ranitidine (ZANTAC) 300 MG tablet Take 1 tablet (300 mg total) by mouth at bedtime. Patient not taking: Reported on 12/17/2020 03/08/17   Jiles Prows, MD     Vital Signs: BP 129/66 (BP Location: Left Arm)   Pulse 71   Temp 97.9 F (36.6 C) (Axillary)   Resp 20   Ht _0  (1.905 m)   Wt 217 lb 12.8 oz (98.8 kg)   SpO2 95%   BMI 27.22 kg/m   Physical Exam awake, alert.  Left lower abdominal drain intact, some old blood around insertion site, minimal tenderness to palpation, output about 10 cc of blood-tinged fluid  Imaging: [place] EKG SITE RITE  Result Date: 12/24/2020 If Site Rite image not attached, placement could not be confirmed due to current cardiac rhythm.   Labs:  CBC: Recent Labs    12/20/20 0656 12/21/20 0315 12/22/20 0305 12/23/20 0132  WBC 16.1* 16.8* 8.9 8.4  HGB 13.0 13.5 12.0* 12.3*  HCT 39.2 40.6 35.6* 37.1*  PLT 288 328 283 322    COAGS: Recent Labs    12/11/20 1828 12/16/20 1007 12/17/20 2116 12/18/20 0455 12/19/20 0024 12/20/20 0639 12/21/20 0713  INR 1.9*  --   --   --   --   --  1.3*  APTT  --    < > 123* 102* 92* 80*  --    < > = values in this interval not displayed.    BMP: Recent Labs    12/20/20 0656 12/21/20 0315 12/22/20 0305 12/23/20 0132  NA 142 139 139 139  K 3.8 4.2 4.0 3.7  CL 112* 110 110 108  CO2 25 18* 22 25  GLUCOSE 109* 89 85 92  BUN _1 CALCIUM 9.0 8.9 8.6* 8.5*  CREATININE 1.23 1.09 1.11 1.11  GFRNONAA 59* >60 >60 >60    LIVER FUNCTION TESTS: Recent Labs    06/23/20 1044 12/11/20 1828 12/15/20 1614 12/16/20 0200  BILITOT 0.8 1.6* 1.5* 1.1  AST 23 26 44* 30  ALT 18 24 35 26   ALKPHOS 35* 40 83 54  PROT 6.7 6.4* 6.5 5.0*  ALBUMIN 4.1 3.8 3.1* 2.3*    Assessment and Plan: Patient with history of A. fib, coronary artery disease with prior stenting, obstructive sleep apnea, chronic kidney disease, diverticulitis with associated perforation and abscess, status post left lower quadrant drain placement on 4/12; afebrile, no new labs, drain fluid cultures grew E. Coli; tentatively scheduled for discharge this weekend; recommend once daily irrigation of drain  with 5 cc sterile normal saline, output recording and dressing changes every 1 to 2 days.;  IR drain clinic follow-up will be arranged for patient; patient can call [phone] for any drain related questions   Electronically Signed: D. Rowe Robert, PA-C 12/25/2020, 11:40 AM   I spent a total of 15 minutes at the the patient's bedside AND on the patient's hospital floor or unit, greater than 50% of which was counseling/coordinating care for abdominal abscess drain

## 2020-12-25 NOTE — TOC Initial Note (Signed)
Transition of Care [facility]) - Initial/Assessment Note    Patient Details  Name: [name] MRN: [id number] Date of Birth: 04/13/1941  Transition of Care [facility]) CM/SW Contact:    Bethena Roys, RN Phone Number: 12/25/2020, 9:11 AM  Clinical Narrative:  Case Manager received notification on 12-24-20 that the patient will transition home with IV antibiotics. Case Manager called the patient and spouse answered the phone- neither had a preference for home health agency. Case Manager called [facility]- patient has used before and the agency is unable to accept the patient. [facility] is not able to accept due to staffing. Case Manager called [facility] and they can service the patient. First visit will be on Sunday 12-26-20 from 2-4 pm. Patient will get his dose of antibiotics prior to transition home today. [facility] will supply the IV infusion and Case Manager asked the MD to sign the OPAT order on Friday. Case Manager has asked staff RN to educate the patient regarding how to flush the drain. Patient will have private transportation via private vehicle home. No further needs from Case Manager at this time.                  Expected Discharge Plan: [place] Barriers to Discharge: No Barriers Identified   Patient Goals and CMS Choice Patient states their goals for this hospitalization and ongoing recovery are:: to return home with spouse.   Choice offered to / list presented to : NA (Patient did not have a preference with home health agency.)  Expected Discharge Plan and Services Expected Discharge Plan: [place] In-house Referral: NA Discharge Planning Services: CM Consult Post Acute Care Choice: [place] arrangements for the past 2 months: Single Family Home                 DME Arranged: N/A         HH Arranged: IV Antibiotics,RN HH Agency: [place] Date [facility] Agency Contacted: 12/24/20 Time HH Agency Contacted:  1700 Representative spoke with at [place]: Jenny Reichmann  Prior Living Arrangements/Services Living arrangements for the past 2 months: [place] with:: Self,Spouse Patient language and need for interpreter reviewed:: Yes Do you feel safe going back to the place where you live?: Yes      Need for Family Participation in Patient Care: Yes (Comment) Care giver support system in place?: Yes (comment)   Criminal Activity/Legal Involvement Pertinent to Current Situation/Hospitalization: No - Comment as needed  Activities of Daily Living Home Assistive Devices/Equipment: CPAP,Eyeglasses ADL Screening (condition at time of admission) Patient's cognitive ability adequate to safely complete daily activities?: Yes Is the patient deaf or have difficulty hearing?: No Does the patient have difficulty seeing, even when wearing glasses/contacts?: Yes Does the patient have difficulty concentrating, remembering, or making decisions?: No Patient able to express need for assistance with ADLs?: Yes Does the patient have difficulty dressing or bathing?: No Independently performs ADLs?: Yes (appropriate for developmental age) Does the patient have difficulty walking or climbing stairs?: No Weakness of Legs: None Weakness of Arms/Hands: None  Permission Sought/Granted Permission sought to share information with : Family Estate agent Permission granted to share information with : Yes, Verbal Permission Granted     Permission granted to share info w AGENCY: [facility] for RN services and [facility] for IV infusion        Emotional Assessment Appearance:: Appears stated age Attitude/Demeanor/Rapport: Engaged Affect (typically observed): Appropriate Orientation: :  Oriented to Situation,Oriented to  Time,Oriented to Place,Oriented to Self Alcohol / Substance Use: Not Applicable Psych Involvement: No (comment)  Admission diagnosis:  Diverticulitis  [K57.92] Perforation bowel ([place]) [K63.1] Diverticulitis of colon with perforation [K57.20] Patient Active Problem List   Diagnosis Date Noted  . Protein-calorie malnutrition, severe 12/24/2020  . Diverticulitis of colon with perforation 12/15/2020  . Acute renal failure superimposed on stage 3a chronic kidney disease ([place]) 12/15/2020  . Severe sepsis ([place]) 12/15/2020  . Eustachian tube dysfunction 06/27/2020  . CKD (chronic kidney disease) stage 3, GFR 30-59 ml/min (HCC) 06/27/2020  . Vertigo 06/27/2020  . Olecranon bursitis, right elbow 06/27/2020  . Sinus node dysfunction ([place]) 09/10/2019  . LBBB (left bundle branch block) 09/10/2019  . Hyperglycemia 04/19/2016  . Chronic combined systolic and diastolic CHF (congestive heart failure) ([place]) 05/05/2015  . Insomnia 04/07/2015  . Aftercare following surgery of the circulatory system, [place] 04/24/2014  . Degenerative joint disease (DJD) of hip 12/09/2013  . 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 Osteoarthritis of left hip 11/05/2013  . Chronic left sacroiliac joint pain 10/13/2013  . Discoloration of skin of lower leg-Left 04/11/2013  . Allergic rhinitis 06/22/2012  . Encounter for well adult exam with abnormal findings 06/21/2012  . Chronic sinusitis 06/21/2012  . 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 Erectile dysfunction 02/21/2011  . 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 CERVICAL RADICULOPATHY, LEFT 05/31/2010  . Atrial fibrillation ([place]) 05/11/2009  . 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 LATERAL EPICONDYLITIS, RIGHT 02/02/2009  . HYPERLIPIDEMIA 01/07/2008  . DEPRESSION 01/07/2008  . OBSTRUCTIVE SLEEP APNEA 01/07/2008  . Peripheral vascular disease ([place]) 01/07/2008  . DIVERTICULOSIS, COLON 01/07/2008   PCP:  Biagio Borg, MD Pharmacy:   [facility], [place] [address] [place] [address] Phone: [phone] Fax: [phone]   Readmission Risk  Interventions No flowsheet data found.

## 2020-12-25 NOTE — Progress Notes (Signed)
Mobility Specialist: Progress Note   12/25/20 1335  Mobility  Activity Refused mobility   Pt refused mobility, no reason specified. Pt hopeful to discharge soon.   [facility] Annis Lagoy Mobility Specialist Mobility Specialist Phone: [phone]

## 2020-12-26 DIAGNOSIS — I2510 Atherosclerotic heart disease of native coronary artery without angina pectoris: Secondary | ICD-10-CM | POA: Diagnosis not present

## 2020-12-26 DIAGNOSIS — I5042 Chronic combined systolic (congestive) and diastolic (congestive) heart failure: Secondary | ICD-10-CM | POA: Diagnosis not present

## 2020-12-26 DIAGNOSIS — I480 Paroxysmal atrial fibrillation: Secondary | ICD-10-CM | POA: Diagnosis not present

## 2020-12-26 DIAGNOSIS — K5720 Diverticulitis of large intestine with perforation and abscess without bleeding: Secondary | ICD-10-CM | POA: Diagnosis not present

## 2020-12-26 LAB — AEROBIC/ANAEROBIC CULTURE W GRAM STAIN (SURGICAL/DEEP WOUND): Special Requests: NORMAL

## 2020-12-27 ENCOUNTER — Telehealth: Payer: Self-pay | Admitting: Internal Medicine

## 2020-12-27 ENCOUNTER — Other Ambulatory Visit: Payer: Self-pay | Admitting: Surgery

## 2020-12-27 DIAGNOSIS — K5720 Diverticulitis of large intestine with perforation and abscess without bleeding: Secondary | ICD-10-CM

## 2020-12-27 DIAGNOSIS — K5792 Diverticulitis of intestine, part unspecified, without perforation or abscess without bleeding: Secondary | ICD-10-CM

## 2020-12-27 NOTE — Telephone Encounter (Signed)
Diane a nurse with [facility] calling, requesting verbal orders for home health services. Diane- [phone]

## 2020-12-28 ENCOUNTER — Telehealth: Payer: Self-pay | Admitting: Internal Medicine

## 2020-12-28 ENCOUNTER — Encounter: Payer: Self-pay | Admitting: Infectious Disease

## 2020-12-28 DIAGNOSIS — I5042 Chronic combined systolic (congestive) and diastolic (congestive) heart failure: Secondary | ICD-10-CM | POA: Diagnosis not present

## 2020-12-28 DIAGNOSIS — I480 Paroxysmal atrial fibrillation: Secondary | ICD-10-CM | POA: Diagnosis not present

## 2020-12-28 DIAGNOSIS — I2510 Atherosclerotic heart disease of native coronary artery without angina pectoris: Secondary | ICD-10-CM | POA: Diagnosis not present

## 2020-12-28 DIAGNOSIS — Z792 Long term (current) use of antibiotics: Secondary | ICD-10-CM | POA: Diagnosis not present

## 2020-12-28 DIAGNOSIS — Z5181 Encounter for therapeutic drug level monitoring: Secondary | ICD-10-CM | POA: Diagnosis not present

## 2020-12-28 DIAGNOSIS — K5720 Diverticulitis of large intestine with perforation and abscess without bleeding: Secondary | ICD-10-CM | POA: Diagnosis not present

## 2020-12-28 NOTE — Telephone Encounter (Signed)
Needs OV or UC if possible, as I was not involved in his hospital care and have not seen him post hosp as well

## 2020-12-28 NOTE — Telephone Encounter (Signed)
Ok for verbals 

## 2020-12-28 NOTE — Telephone Encounter (Signed)
[facility] called   Reporting edema on left leg from the shin down to the foot. No edema on the right leg. She is concerned blood clots due to heart rate being elevated while he was in the hospital.   Please advise and call back at [phone]

## 2020-12-28 NOTE — Telephone Encounter (Signed)
Verbals given to [employer]

## 2020-12-29 DIAGNOSIS — T8189XA Other complications of procedures, not elsewhere classified, initial encounter: Secondary | ICD-10-CM | POA: Diagnosis not present

## 2020-12-29 NOTE — Telephone Encounter (Signed)
Patient disagrees with needing to be seen he states the swelling went down already.

## 2020-12-31 DIAGNOSIS — K5720 Diverticulitis of large intestine with perforation and abscess without bleeding: Secondary | ICD-10-CM | POA: Diagnosis not present

## 2021-01-03 DIAGNOSIS — Z5181 Encounter for therapeutic drug level monitoring: Secondary | ICD-10-CM | POA: Diagnosis not present

## 2021-01-03 DIAGNOSIS — Z792 Long term (current) use of antibiotics: Secondary | ICD-10-CM | POA: Diagnosis not present

## 2021-01-03 DIAGNOSIS — I2510 Atherosclerotic heart disease of native coronary artery without angina pectoris: Secondary | ICD-10-CM | POA: Diagnosis not present

## 2021-01-03 DIAGNOSIS — K5720 Diverticulitis of large intestine with perforation and abscess without bleeding: Secondary | ICD-10-CM | POA: Diagnosis not present

## 2021-01-03 DIAGNOSIS — I480 Paroxysmal atrial fibrillation: Secondary | ICD-10-CM | POA: Diagnosis not present

## 2021-01-03 DIAGNOSIS — I5042 Chronic combined systolic (congestive) and diastolic (congestive) heart failure: Secondary | ICD-10-CM | POA: Diagnosis not present

## 2021-01-05 ENCOUNTER — Encounter: Payer: Self-pay | Admitting: Radiology

## 2021-01-05 ENCOUNTER — Ambulatory Visit
Admission: RE | Admit: 2021-01-05 | Discharge: 2021-01-05 | Disposition: A | Discharge status: Home / Self Care | Payer: Medicare Other | Source: Ambulatory Visit | Attending: Surgery | Admitting: Surgery

## 2021-01-05 ENCOUNTER — Ambulatory Visit
Admission: RE | Admit: 2021-01-05 | Discharge: 2021-01-05 | Disposition: A | Discharge status: Home / Self Care | Payer: Medicare Other | Source: Ambulatory Visit | Attending: Student | Admitting: Student

## 2021-01-05 ENCOUNTER — Other Ambulatory Visit: Payer: Self-pay

## 2021-01-05 DIAGNOSIS — K5750 Diverticulosis of both small and large intestine without perforation or abscess without bleeding: Secondary | ICD-10-CM | POA: Diagnosis not present

## 2021-01-05 DIAGNOSIS — K7689 Other specified diseases of liver: Secondary | ICD-10-CM | POA: Diagnosis not present

## 2021-01-05 DIAGNOSIS — K5720 Diverticulitis of large intestine with perforation and abscess without bleeding: Secondary | ICD-10-CM

## 2021-01-05 DIAGNOSIS — K5792 Diverticulitis of intestine, part unspecified, without perforation or abscess without bleeding: Secondary | ICD-10-CM

## 2021-01-05 DIAGNOSIS — Z4682 Encounter for fitting and adjustment of non-vascular catheter: Secondary | ICD-10-CM | POA: Diagnosis not present

## 2021-01-05 HISTORY — PX: IR RADIOLOGIST EVAL & MGMT: IMG5224

## 2021-01-05 MED ORDER — IOPAMIDOL (ISOVUE-300) INJECTION 61%
100.0000 mL | Freq: Once | INTRAVENOUS | Status: AC | PRN
  Administered 2021-01-05: 100 mL via INTRAVENOUS

## 2021-01-05 NOTE — Progress Notes (Signed)
Referring Physician(s): Dr. Louanna Raw  Chief Complaint: The patient is seen in follow up today s/p perforated diverticulitis  History of present illness: [name] is an 80 year old man with past medical history of a fib, combined diastolic and systolic heart failure, CAD s/p stent placement, OSA, CKD Stage III who presented to [facility] ED 12/15/20 with several days history of acute abdominal pain. CT Abdomen Pelvis 12/15/20 showed perforated diverticulitis with pneumoperitoneum but no abscess.  He was experiencing tachycardia due a flutter.  He was managed conservatively with IV abx with concurrent Cardiology management. Repeat imaging 12/20/20 showed interval development of a large left lower quadrant abscess and he underwent LLQ drain placement by Dr. Vernard Gambles 12/21/20.  He subsequently improved and was discharged home on IV abx via PICC line with drain in place.   [name] presents to IR drain clinic today for drain evaluation and management.  He presents today with no new concerns.  Reports his abdominal pain has greatly improved and that he only has a small amount of pain lateral to the drain that feels muscular in nature.  He has been flushing his drain daily.  There has been limited output of <5 mL/day.  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     Past Medical History:  Diagnosis Date  . Allergic rhinitis    takes Allegra daily  . Allergic rhinitis, cause unspecified 06/22/2012  . Arthritis   . Atrial fibrillation ([place])    takes Tikosyn and Eliquis daily  . 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 Coronary artery disease    LAD stenting 2004 non-DES  . Depression    took Zoloft 24yr ago but nothing now  . Diverticulosis   . [place]   . Dysphagia    occasionally  . History of blood clots 2003   left leg prior to fem pop  . History of colon polyps   . Hyperlipidemia    takes Tricor and  Lipitor daily  . Insomnia    takes Ambien nightly as needed  . Joint pain   . Joint swelling   . 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 OSA (obstructive sleep apnea)    CPAP  . Pacemaker   . Peripheral vascular disease ([place]   . Pleurisy    early 89's . Prostatitis   . Urinary urgency     Past Surgical History:  Procedure Laterality Date  . CARDIAC CATHETERIZATION  2004  . CARDIAC CATHETERIZATION N/A 05/06/2015   Procedure: Left Heart Cath and Coronary Angiography;  Surgeon: HBelva Crome MD;  [facility];  Service: Cardiovascular;  Laterality: N/A;  . CARDIOVERSION N/A 12/18/2020   Procedure: CARDIOVERSION;  Surgeon: NJosue Hector MD;  [facility];  Service: Cardiovascular;  Laterality: N/A;  . COLONOSCOPY    . hand and arm surgery Right    as a teenager  . HAND SURGERY Left   . INGUINAL HERNIA REPAIR Right   . JOINT REPLACEMENT Left December 09, 2013   Left Hip   . JOINT REPLACEMENT Right Aug. 25, 2015   Right Hip  . left knee surgery    . PACEMAKER INSERTION     due to bradycardia  . PERMANENT PACEMAKER GENERATOR CHANGE N/A 06/17/2012   Procedure: PERMANENT PACEMAKER GENERATOR CHANGE;  Surgeon: SDeboraha Sprang MD;  [facility];  Service: Cardiovascular;  Laterality: N/A;  . right and left fem-pop bypass    . stent (unspec)     x2  . TOTAL HIP ARTHROPLASTY Left 12/09/2013   Procedure: TOTAL HIP ARTHROPLASTY ANTERIOR APPROACH;  Surgeon: Hessie Dibble, MD;  Location: [place];  Service: Orthopedics;  Laterality: Left;  left anterior total hip arthroplasty  . TOTAL HIP ARTHROPLASTY Right 05/05/2014   Procedure: TOTAL HIP ARTHROPLASTY ANTERIOR APPROACH;  Surgeon: Hessie Dibble, MD;  Location: [place];  Service: Orthopedics;  Laterality: Right;  . TURBINATE REDUCTION    . wisdom extracted       Allergies: Amiodarone, Niacin, Ace inhibitors, and Mometasone furoate  Medications: Prior to Admission medications   Medication  Sig Start Date End Date Taking? Authorizing Provider  atorvastatin (LIPITOR) 80 MG tablet TAKE 1 TABLET BY MOUTH  DAILY Patient taking differently: Take 80 mg by mouth daily. 09/23/20   Deboraha Sprang, MD  CALCIUM PO Take 600 mg by mouth 2 (two) times daily.    [provider]  carvedilol (COREG) 25 MG tablet TAKE 2 TABLETS BY MOUTH  TWICE DAILY Patient taking differently: Take 50 mg by mouth 2 (two) times daily with a meal. 09/23/20   Deboraha Sprang, MD  cefTRIAXone (ROCEPHIN) IVPB Inject 2 g into the vein daily for 28 days. Indication:  Paradiverticular abscess  First Dose: Yes Last Day of Therapy:  01/21/21 Labs - Once weekly:  CBC/D and BMP, Labs - Every other week:  ESR and CRP Method of administration: IV Push Method of administration may be changed at the discretion of home infusion pharmacist based upon assessment of the patient and/or caregiver's ability to self-administer the medication ordered. 12/24/20 01/21/21  Debbe Odea, MD  Cholecalciferol (D3 VITAMIN PO) Take 4,000 Units by mouth daily.    [provider]  Coenzyme Q10 (COQ-10 PO) Take 300 mg by mouth daily.    [provider]  Cyanocobalamin (VITAMIN B 12) 500 MCG TABS Take 500 mcg by mouth 3 (three) times a week.    [provider]  diltiazem (CARDIZEM CD) 120 MG 24 hr capsule Take 1 capsule (120 mg total) by mouth daily. 12/26/20   Debbe Odea, MD  dofetilide (TIKOSYN) 500 MCG capsule TAKE 1 CAPSULE BY MOUTH  TWICE DAILY Patient taking differently: Take 500 mcg by mouth 2 (two) times daily. 09/23/20   Deboraha Sprang, MD  ELIQUIS 5 MG TABS tablet TAKE 1 TABLET BY MOUTH  TWICE DAILY Patient taking differently: Take 5 mg by mouth 2 (two) times daily. 09/23/20   Deboraha Sprang, MD  fenofibrate 160 MG tablet TAKE 1 TABLET BY MOUTH  DAILY Patient taking differently: Take 160 mg by mouth daily. 09/23/20   Deboraha Sprang, MD  fexofenadine (ALLEGRA) 180 MG tablet Take 180 mg by mouth daily.     [provider]  Fluocinonide Emulsified Base 0.05 % CREA APPLY TO AFFECTED AREA(S)  TWO TIMES DAILY AS NEEDED 01/16/19   Biagio Borg, MD  folic acid (FOLVITE) 440 MCG tablet Take 400 mcg by mouth daily.    [provider]  losartan (COZAAR) 25 MG tablet TAKE 1 TABLET BY MOUTH  DAILY Patient taking differently: Take 12.5 mg by mouth in the morning and at bedtime. 09/23/20   Deboraha Sprang, MD  Lysine 500 MG TABS Take 500 mg by mouth 3 (three) times daily.    [provider]  Magnesium 250 MG TABS Take 250  mg by mouth 2 (two) times daily.     [provider]  metroNIDAZOLE (FLAGYL) 500 MG tablet Take 1 tablet (500 mg total) by mouth every 8 (eight) hours. 12/25/20   Debbe Odea, MD  potassium chloride (KLOR-CON) 10 MEQ tablet TAKE 1 TABLET BY MOUTH  TWICE DAILY Patient taking differently: Take 10 mEq by mouth daily. 09/23/20   Deboraha Sprang, MD  ranitidine (ZANTAC) 300 MG tablet Take 1 tablet (300 mg total) by mouth at bedtime. Patient not taking: Reported on 12/17/2020 03/08/17   Jiles Prows, MD  ranolazine (RANEXA) 500 MG 12 hr tablet Take 1 tablet (500 mg total) by mouth 2 (two) times daily. 06/01/20   Deboraha Sprang, MD  traMADol (ULTRAM) 50 MG tablet 1-2 tablets every 6 hours as needed for pain Patient taking differently: Take 50-100 mg by mouth every 6 (six) hours as needed for moderate pain. 12/11/20   Charlesetta Shanks, MD     Family History  Problem Relation Age of Onset  . Diabetes Other        family hx  . Sleep apnea Other        family hx  . Diabetes Mother   . Heart disease Mother        Before age 52  . Heart disease Father        Before age 23  . Heart attack Father   . Stroke Father   . Aneurysm Father        bilat pop art aneurysms  . Peripheral vascular disease Father        Popliteal Aneurysm  . Allergic rhinitis Neg Hx   . Angioedema Neg Hx   . Asthma Neg Hx   . Eczema Neg Hx   . Immunodeficiency Neg Hx   . Urticaria Neg Hx      Social History   Socioeconomic History  . Marital status: Married    Spouse name: Not on file  . Number of children: Not on file  . Years of education: Not on file  . Highest education level: Not on file  Occupational History  . Not on file  Tobacco Use  . Smoking status: Never Smoker  . Smokeless tobacco: Never Used  Vaping Use  . Vaping Use: Never used  Substance and Sexual Activity  . Alcohol use: No    Alcohol/week: 0.0 standard drinks    Comment: nothing since 2000  . Drug use: No  . Sexual activity: Yes  Other Topics Concern  . 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    Social Determinants of Health   Financial Resource Strain: Low Risk   . Difficulty of Paying Living Expenses: Not hard at all  Food Insecurity: No Food Insecurity  . Worried About Charity fundraiser in the Last Year: Never true  . Ran Out of Food in the Last Year: Never true  Transportation Needs: No Transportation Needs  . Lack of Transportation (Medical): No  . Lack of Transportation (Non-Medical): No  Physical Activity: Sufficiently Active  . Days of Exercise per Week: 7 days  . Minutes of Exercise per Session: 60 min  Stress: No Stress Concern Present  . Feeling of Stress : Not at all  Social Connections: Socially Integrated  . Frequency of Communication with Friends and Family: More than three times a week  . Frequency of Social Gatherings with Friends and Family: More than three  times a week  . Attends Religious Services: More than 4 times per year  . Active Member of Clubs or Organizations: Yes  . Attends Archivist Meetings: More than 4 times per year  . Marital Status: Married     Vital Signs: There were no vitals taken for this visit.  Physical Exam  NAD, alert Abdomen: soft, non-tender.  No reproducible abdominal pain on exam while lying down.  LLQ drain in place without issue.  Insertion site intact with no  leakage.  Small amount of serosanguinous fluid in bag.   Imaging: CT ABDOMEN PELVIS W CONTRAST  Result Date: 01/05/2021 CLINICAL DATA:  History of diverticular abscess post percutaneous drainage catheter placement on 12/21/2020 Vernard Gambles). Patient presents today for drainage catheter evaluation and management. EXAM: CT ABDOMEN AND PELVIS WITH CONTRAST TECHNIQUE: Multidetector CT imaging of the abdomen and pelvis was performed using the standard protocol following bolus administration of intravenous contrast. CONTRAST:  119m ISOVUE-300 IOPAMIDOL (ISOVUE-300) INJECTION 61% COMPARISON:  CT abdomen pelvis-12/20/2020; 12/15/2020; 12/11/2020; CT-guided percutaneous catheter placement-12/21/2020 FINDINGS: Lower chest: Limited visualization the lower thorax demonstrates minimal dependent subpleural ground-glass atelectasis, left greater than right. Minimal ground-glass atelectasis involving the caudal aspects of the right middle lobe and lingula. No discrete focal airspace opacities. No pleural effusion. Cardiomegaly. Pacer leads terminate within the right atrium and ventricle. Coronary calcifications. Trace amount of pericardial fluid, presumably physiologic. Hepatobiliary: The medial segment of the left lobe of the liver is again noted to be atrophic. Portal vein remains patent. No discrete hepatic lesions. Normal appearance of the gallbladder given degree of distention. No radiopaque gallstones. No intra or extrahepatic biliary ductal dilatation. No ascites. Pancreas: Normal appearance of the pancreas. Spleen: Normal appearance of the spleen. Adrenals/Urinary Tract: There is symmetric enhancement of the bilateral kidneys. No evidence nephrolithiasis on this postcontrast examination. Note is made of an approximately 1.6 cm hypoattenuating partially exophytic cyst arising from the inferior pole the left kidney (coronal image 111, series 6) no discrete right-sided renal lesions with suspected hypertrophy of a accessory  right renal artery supplying the inferior pole of the right kidney arising caudal to the takeoff of the IMA. No urine obstruction or perinephric stranding. Normal appearance of the bilateral adrenal glands. Evaluation of the urinary bladder is significantly degraded secondary to streak artifact from the patient's bilateral total hip prostheses. Stomach/Bowel: Interval resolution of left lower abdominal/pelvic abscess following percutaneous drainage catheter placement. Scattered colonic diverticulosis without evidence of superimposed acute diverticulitis. Moderate colonic stool burden without evidence of enteric obstruction. The cecum is noted to be located within the right hemipelvis. Evaluation of the terminal ileum and appendix is degraded secondary to significant streak artifact from the patient's bilateral total hip prostheses. No discrete areas of bowel wall thickening. No significant hiatal hernia. No pneumoperitoneum, pneumatosis or portal venous gas. Vascular/Lymphatic: Moderate amount of predominantly calcified atherosclerotic plaque within normal caliber abdominal aorta, not resulting in a hemodynamically significant stenosis. As above, there is a hypertrophied accessory right renal artery which allows arises inferior to the takeoff of the IMA and supplies the inferior pole the right kidney, suboptimally evaluated on this non CTA examination. No bulky retroperitoneal, mesenteric, pelvic or inguinal lymphadenopathy. Reproductive: Suboptimally evaluated due to streak artifact from the bilateral total hip prostheses. Other: Small bilateral mesenteric fat containing inguinal hernias, left greater than right. There is a minimal amount of subcutaneous edema about the midline of the low back. Musculoskeletal: No acute or aggressive osseous abnormalities. Moderate to severe multilevel lumbar spine DDD,  worse at L4-L5 and L5-S1 with disc space height loss, endplate irregularity and small posteriorly directed disc  osteophyte complexes at these locations. Post bilateral total hip replacements without definitive evidence of hardware failure or loosening. IMPRESSION: 1. Interval resolution of left lower abdominal/pelvic abscess following percutaneous drainage catheter placement. No new definable/drainable fluid collection of the abdomen pelvis, though note, evaluation of pelvis is degraded secondary to significant streak artifact from the patient's bilateral total hip prostheses. 2. Colonic diverticulosis without evidence of superimposed acute diverticulitis. 3. Coronary calcifications.  Aortic Atherosclerosis (ICD10-I70.0). 4. Questionable hepatic cirrhosis with geographic atrophy involving the medial segment of left lobe of the liver. Correlation with LFTs is advised PLAN: Patient subsequently underwent percutaneous drainage catheter injection. Electronically Signed   By: Sandi Mariscal M.D.   On: 01/05/2021 13:07    Labs:  CBC: Recent Labs    12/20/20 0656 12/21/20 0315 12/22/20 0305 12/23/20 0132  WBC 16.1* 16.8* 8.9 8.4  HGB 13.0 13.5 12.0* 12.3*  HCT 39.2 40.6 35.6* 37.1*  PLT 288 328 283 322    COAGS: Recent Labs    12/11/20 1828 12/16/20 1007 12/17/20 2116 12/18/20 0455 12/19/20 0024 12/20/20 0639 12/21/20 0713  INR 1.9*  --   --   --   --   --  1.3*  APTT  --    < > 123* 102* 92* 80*  --    < > = values in this interval not displayed.    BMP: Recent Labs    12/20/20 0656 12/21/20 0315 12/22/20 0305 12/23/20 0132  NA 142 139 139 139  K 3.8 4.2 4.0 3.7  CL 112* 110 110 108  CO2 25 18* 22 25  GLUCOSE 109* 89 85 92  BUN _0 CALCIUM 9.0 8.9 8.6* 8.5*  CREATININE 1.23 1.09 1.11 1.11  GFRNONAA 59* >60 >60 >60    LIVER FUNCTION TESTS: Recent Labs    06/23/20 1044 12/11/20 1828 12/15/20 1614 12/16/20 0200  BILITOT 0.8 1.6* 1.5* 1.1  AST 23 26 44* 30  ALT 18 24 35 26  ALKPHOS 35* 40 83 54  PROT 6.7 6.4* 6.5 5.0*  ALBUMIN 4.1 3.8 3.1* 2.3*     Assessment: Diverticulitis with perforation and development of large LLQ abscess s/p LLQ drain placement 12/21/20 by Dr. Vernard Gambles Patient presents to IR drain clinic today for follow-up and management of his drain.  He reports he has ben feeling better at home. He has been eating and drinking well.  He continues with IV abx for 2.5 more weeks and is followed by ID.  He does not currently have scheduled surgical follow-up.  He reports his drain has been functioning well without issue and output has diminished over the past several days.  CT Abdomen Pelvis performed today and reviewed by Dr. Pascal Lux who notes resolution of abscess. Drain injection also performed and shows no fistulous connection.  Per Dr. Pascal Lux, drain ok to be removed.  Drain removed by this PA in its entirety without complication.  Patient tolerated well. Dressing placed.  Care instructions given.  Encouraged patient to call surgeon's office to ensure no follow-up with them is needed.  Encouraged to keep his scheduled follow-up with ID. No follow-up with IR needed at this time.    Signed: Docia Barrier, PA 01/05/2021, 1:20 PM   Please refer to Dr. Deniece Portela attestation of this note for management and plan.

## 2021-01-06 DIAGNOSIS — K5720 Diverticulitis of large intestine with perforation and abscess without bleeding: Secondary | ICD-10-CM | POA: Diagnosis not present

## 2021-01-10 ENCOUNTER — Encounter: Payer: Self-pay | Admitting: Infectious Disease

## 2021-01-10 DIAGNOSIS — Z5181 Encounter for therapeutic drug level monitoring: Secondary | ICD-10-CM | POA: Diagnosis not present

## 2021-01-10 DIAGNOSIS — I2510 Atherosclerotic heart disease of native coronary artery without angina pectoris: Secondary | ICD-10-CM | POA: Diagnosis not present

## 2021-01-10 DIAGNOSIS — K5720 Diverticulitis of large intestine with perforation and abscess without bleeding: Secondary | ICD-10-CM | POA: Diagnosis not present

## 2021-01-10 DIAGNOSIS — I5042 Chronic combined systolic (congestive) and diastolic (congestive) heart failure: Secondary | ICD-10-CM | POA: Diagnosis not present

## 2021-01-10 DIAGNOSIS — Z792 Long term (current) use of antibiotics: Secondary | ICD-10-CM | POA: Diagnosis not present

## 2021-01-10 DIAGNOSIS — I480 Paroxysmal atrial fibrillation: Secondary | ICD-10-CM | POA: Diagnosis not present

## 2021-01-13 DIAGNOSIS — K5720 Diverticulitis of large intestine with perforation and abscess without bleeding: Secondary | ICD-10-CM | POA: Diagnosis not present

## 2021-01-16 ENCOUNTER — Emergency Department (HOSPITAL_COMMUNITY)
Admission: EM | Admit: 2021-01-16 | Discharge: 2021-01-16 | Disposition: A | Discharge status: Home / Self Care | Payer: Medicare Other | Attending: Emergency Medicine | Admitting: Emergency Medicine

## 2021-01-16 ENCOUNTER — Emergency Department (HOSPITAL_COMMUNITY): Payer: Medicare Other

## 2021-01-16 ENCOUNTER — Encounter (HOSPITAL_COMMUNITY): Payer: Self-pay | Admitting: Emergency Medicine

## 2021-01-16 ENCOUNTER — Other Ambulatory Visit: Payer: Self-pay

## 2021-01-16 DIAGNOSIS — Z79899 Other long term (current) drug therapy: Secondary | ICD-10-CM | POA: Insufficient documentation

## 2021-01-16 DIAGNOSIS — K529 Noninfective gastroenteritis and colitis, unspecified: Secondary | ICD-10-CM | POA: Diagnosis not present

## 2021-01-16 DIAGNOSIS — I5042 Chronic combined systolic (congestive) and diastolic (congestive) heart failure: Secondary | ICD-10-CM | POA: Diagnosis not present

## 2021-01-16 DIAGNOSIS — R1032 Left lower quadrant pain: Secondary | ICD-10-CM | POA: Diagnosis not present

## 2021-01-16 DIAGNOSIS — I2510 Atherosclerotic heart disease of native coronary artery without angina pectoris: Secondary | ICD-10-CM | POA: Diagnosis not present

## 2021-01-16 DIAGNOSIS — Z96643 Presence of artificial hip joint, bilateral: Secondary | ICD-10-CM | POA: Insufficient documentation

## 2021-01-16 DIAGNOSIS — N1831 Chronic kidney disease, stage 3a: Secondary | ICD-10-CM | POA: Diagnosis not present

## 2021-01-16 DIAGNOSIS — I4891 Unspecified atrial fibrillation: Secondary | ICD-10-CM | POA: Diagnosis not present

## 2021-01-16 DIAGNOSIS — Z7901 Long term (current) use of anticoagulants: Secondary | ICD-10-CM | POA: Diagnosis not present

## 2021-01-16 DIAGNOSIS — K5730 Diverticulosis of large intestine without perforation or abscess without bleeding: Secondary | ICD-10-CM | POA: Diagnosis not present

## 2021-01-16 DIAGNOSIS — R9431 Abnormal electrocardiogram [ECG] [EKG]: Secondary | ICD-10-CM | POA: Diagnosis not present

## 2021-01-16 DIAGNOSIS — I700 Atherosclerosis of aorta: Secondary | ICD-10-CM | POA: Diagnosis not present

## 2021-01-16 DIAGNOSIS — Z950 Presence of cardiac pacemaker: Secondary | ICD-10-CM | POA: Insufficient documentation

## 2021-01-16 DIAGNOSIS — I130 Hypertensive heart and chronic kidney disease with heart failure and stage 1 through stage 4 chronic kidney disease, or unspecified chronic kidney disease: Secondary | ICD-10-CM | POA: Insufficient documentation

## 2021-01-16 LAB — COMPREHENSIVE METABOLIC PANEL
ALT: 17 U/L (ref 0–44)
AST: 21 U/L (ref 15–41)
Albumin: 3.6 g/dL (ref 3.5–5.0)
Alkaline Phosphatase: 42 U/L (ref 38–126)
Anion gap: 6 (ref 5–15)
BUN: 19 mg/dL (ref 8–23)
CO2: 24 mmol/L (ref 22–32)
Calcium: 9.8 mg/dL (ref 8.9–10.3)
Chloride: 109 mmol/L (ref 98–111)
Creatinine, Ser: 1.03 mg/dL (ref 0.61–1.24)
GFR, Estimated: >60 mL/min (ref >60)
Glucose, Bld: 89 mg/dL (ref 70–99)
Potassium: 4.4 mmol/L (ref 3.5–5.1)
Sodium: 139 mmol/L (ref 135–145)
Total Bilirubin: 0.6 mg/dL (ref 0.3–1.2)
Total Protein: 6.9 g/dL (ref 6.5–8.1)

## 2021-01-16 LAB — CBC
HCT: 40.4 % (ref 39.0–52.0)
Hemoglobin: 13.4 g/dL (ref 13.0–17.0)
MCH: 33.5 pg (ref 26.0–34.0)
MCHC: 33.2 g/dL (ref 30.0–36.0)
MCV: 101 fL — ABNORMAL HIGH (ref 80.0–100.0)
Platelets: 200 10*3/uL (ref 150–400)
RBC: 4 MIL/uL — ABNORMAL LOW (ref 4.22–5.81)
RDW: 14.7 % (ref 11.5–15.5)
WBC: 6.2 10*3/uL (ref 4.0–10.5)
nRBC: 0 % (ref 0.0–0.2)

## 2021-01-16 LAB — LIPASE, BLOOD: Lipase: 30 U/L (ref 11–51)

## 2021-01-16 MED ORDER — METRONIDAZOLE 500 MG PO TABS: 500.0000 mg | ORAL_TABLET | Freq: Three times a day (TID) | ORAL | 0 refills | Status: DC

## 2021-01-16 MED ORDER — IOHEXOL 300 MG/ML  SOLN
75.0000 mL | Freq: Once | INTRAMUSCULAR | Status: AC | PRN
  Administered 2021-01-16: 75 mL via INTRAVENOUS

## 2021-01-16 MED ORDER — METRONIDAZOLE 500 MG PO TABS
500.0000 mg | ORAL_TABLET | Freq: Once | ORAL | Status: AC
  Administered 2021-01-16: 500 mg via ORAL
  Filled 2021-01-16: qty 1

## 2021-01-16 NOTE — ED Triage Notes (Signed)
C/o LLQ pain x 4-5 days.  States pain has gradually increased.  Admitted on 4/14 for diverticulitis.  Denies nausea, vomiting, and diarrhea.  Denies fever and chills.

## 2021-01-16 NOTE — ED Provider Notes (Signed)
[facility] EMERGENCY DEPARTMENT Provider Note   CSN: [id number] Arrival date & time: 01/16/21  1656     History Chief Complaint  Patient presents with  . Abdominal Pain    [name] is a 80 y.o. male.  The history is provided by the patient.   [name] is a 80 y.o. male who presents to the Emergency Department complaining of LLQ abdominal pain started 3-4 days ago. He was admitted on April 14 for diverticulitis complicated by abscess with dream placement. The drain was removed on April 27. He was discharged home on April 23. He has been continuing on antibiotics in the right upper extremity PICC line. He received ceftriaxone daily, received today's dose. He is also taking Flagyl, last dose was today. His pain is located a few inches lateral to where the drain was removed from. It is been worsening over the last several days. Pain is described as hurting and it is constant, worse with movement and walking.  No fever, vomiting, nausea, diarrhea, constipation.      Past Medical History:  Diagnosis Date  . Allergic rhinitis    takes Allegra daily  . Allergic rhinitis, cause unspecified 06/22/2012  . Arthritis   . Atrial fibrillation ([place])    takes Tikosyn and Eliquis daily  . 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 Coronary artery disease    LAD stenting 2004 non-DES  . Depression    took Zoloft 68yrs ago but nothing now  . Diverticulosis   . DVT (deep venous thrombosis) ([place])   . Dysphagia    occasionally  . History of blood clots 2003   left leg prior to fem pop  . History of colon polyps   . Hyperlipidemia    takes Tricor and Lipitor daily  . Insomnia    takes Ambien nightly as needed  . Joint pain   . Joint swelling   . 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 OSA (obstructive sleep apnea)    CPAP  . Pacemaker   . Peripheral vascular disease ([place])   . Pleurisy    early 43's  . Prostatitis   . Urinary urgency      Patient Active Problem List   Diagnosis Date Noted  . Protein-calorie malnutrition, severe 12/24/2020  . Diverticulitis of colon with perforation 12/15/2020  . Acute renal failure superimposed on stage 3a chronic kidney disease ([place]) 12/15/2020  . Severe sepsis ([place]) 12/15/2020  . Eustachian tube dysfunction 06/27/2020  . CKD (chronic kidney disease) stage 3, GFR 30-59 ml/min (HCC) 06/27/2020  . Vertigo 06/27/2020  . Olecranon bursitis, right elbow 06/27/2020  . Sinus node dysfunction ([place]) 09/10/2019  . LBBB (left bundle branch block) 09/10/2019  . Hyperglycemia 04/19/2016  . Chronic combined systolic and diastolic CHF (congestive heart failure) ([place]) 05/05/2015  . Insomnia 04/07/2015  . Aftercare following surgery of the circulatory system, [place] 04/24/2014  . Degenerative joint disease (DJD) of hip 12/09/2013  . 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 Osteoarthritis of left hip 11/05/2013  . Chronic left sacroiliac joint pain 10/13/2013  . Discoloration of skin of lower leg-Left 04/11/2013  . Allergic rhinitis 06/22/2012  . Encounter for well adult exam with abnormal findings 06/21/2012  . Chronic sinusitis 06/21/2012  . Aneurysm artery, popliteal ([place]) 01/30/2012  . Erectile dysfunction 02/21/2011  . 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 CERVICAL RADICULOPATHY, LEFT 05/31/2010  . Atrial fibrillation ([place]) 05/11/2009  . 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  LATERAL EPICONDYLITIS, RIGHT 02/02/2009  . HYPERLIPIDEMIA 01/07/2008  . DEPRESSION 01/07/2008  . OBSTRUCTIVE SLEEP APNEA 01/07/2008  . Peripheral vascular disease (HCC) 01/07/2008  . DIVERTICULOSIS, COLON 01/07/2008    Past Surgical History:  Procedure Laterality Date  . CARDIAC CATHETERIZATION  2004  . CARDIAC CATHETERIZATION N/A 05/06/2015   Procedure: Left Heart Cath and Coronary Angiography;  Surgeon: Lyn Records, MD;  Location: [facility] INVASIVE CV LAB;  Service: Cardiovascular;  Laterality: N/A;  . CARDIOVERSION N/A  12/18/2020   Procedure: CARDIOVERSION;  Surgeon: Wendall Stade, MD;  Location: [facility] OR;  Service: Cardiovascular;  Laterality: N/A;  . COLONOSCOPY    . hand and arm surgery Right    as a teenager  . HAND SURGERY Left   . INGUINAL HERNIA REPAIR Right   . IR RADIOLOGIST EVAL & MGMT  01/05/2021  . JOINT REPLACEMENT Left December 09, 2013   Left Hip   . JOINT REPLACEMENT Right Aug. 25, 2015   Right Hip  . left knee surgery    . PACEMAKER INSERTION     due to bradycardia  . PERMANENT PACEMAKER GENERATOR CHANGE N/A 06/17/2012   Procedure: PERMANENT PACEMAKER GENERATOR CHANGE;  Surgeon: Duke Salvia, MD;  Location: [facility] CATH LAB;  Service: Cardiovascular;  Laterality: N/A;  . right and left fem-pop bypass    . stent (unspec)     x2  . TOTAL HIP ARTHROPLASTY Left 12/09/2013   Procedure: TOTAL HIP ARTHROPLASTY ANTERIOR APPROACH;  Surgeon: Velna Ochs, MD;  Location: [facility] OR;  Service: Orthopedics;  Laterality: Left;  left anterior total hip arthroplasty  . TOTAL HIP ARTHROPLASTY Right 05/05/2014   Procedure: TOTAL HIP ARTHROPLASTY ANTERIOR APPROACH;  Surgeon: Velna Ochs, MD;  Location: [facility] OR;  Service: Orthopedics;  Laterality: Right;  . TURBINATE REDUCTION    . wisdom extracted          Family History  Problem Relation Age of Onset  . Diabetes Other        family hx  . Sleep apnea Other        family hx  . Diabetes Mother   . Heart disease Mother        Before age 25  . Heart disease Father        Before age 54  . Heart attack Father   . Stroke Father   . Aneurysm Father        bilat pop art aneurysms  . Peripheral vascular disease Father        Popliteal Aneurysm  . Allergic rhinitis Neg Hx   . Angioedema Neg Hx   . Asthma Neg Hx   . Eczema Neg Hx   . Immunodeficiency Neg Hx   . Urticaria Neg Hx     Social History   Tobacco Use  . Smoking status: Never Smoker  . Smokeless tobacco: Never Used  Vaping Use  . Vaping Use: Never used  Substance Use Topics  . Alcohol  use: No    Alcohol/week: 0.0 standard drinks    Comment: nothing since 2000  . Drug use: No    Home Medications Prior to Admission medications   Medication Sig Start Date End Date Taking? Authorizing Provider  metroNIDAZOLE (FLAGYL) 500 MG tablet Take 1 tablet (500 mg total) by mouth 3 (three) times daily. 01/16/21  Yes Tilden Fossa, MD  atorvastatin (LIPITOR) 80 MG tablet TAKE 1 TABLET BY MOUTH  DAILY Patient taking differently: Take 80 mg by mouth daily. 09/23/20  Duke Salvia, MD  CALCIUM PO Take 600 mg by mouth 2 (two) times daily.    [provider]  carvedilol (COREG) 25 MG tablet TAKE 2 TABLETS BY MOUTH  TWICE DAILY Patient taking differently: Take 50 mg by mouth 2 (two) times daily with a meal. 09/23/20   Duke Salvia, MD  cefTRIAXone (ROCEPHIN) IVPB Inject 2 g into the vein daily for 28 days. Indication:  Paradiverticular abscess  First Dose: Yes Last Day of Therapy:  01/21/21 Labs - Once weekly:  CBC/D and BMP, Labs - Every other week:  ESR and CRP Method of administration: IV Push Method of administration may be changed at the discretion of home infusion pharmacist based upon assessment of the patient and/or caregiver's ability to self-administer the medication ordered. 12/24/20 01/21/21  Calvert Cantor, MD  Cholecalciferol (D3 VITAMIN PO) Take 4,000 Units by mouth daily.    [provider]  Coenzyme Q10 (COQ-10 PO) Take 300 mg by mouth daily.    [provider]  Cyanocobalamin (VITAMIN B 12) 500 MCG TABS Take 500 mcg by mouth 3 (three) times a week.    [provider]  diltiazem (CARDIZEM CD) 120 MG 24 hr capsule Take 1 capsule (120 mg total) by mouth daily. 12/26/20   Calvert Cantor, MD  dofetilide (TIKOSYN) 500 MCG capsule TAKE 1 CAPSULE BY MOUTH  TWICE DAILY Patient taking differently: Take 500 mcg by mouth 2 (two) times daily. 09/23/20   Duke Salvia, MD  ELIQUIS 5 MG TABS tablet TAKE 1 TABLET BY MOUTH  TWICE DAILY Patient taking  differently: Take 5 mg by mouth 2 (two) times daily. 09/23/20   Duke Salvia, MD  fenofibrate 160 MG tablet TAKE 1 TABLET BY MOUTH  DAILY Patient taking differently: Take 160 mg by mouth daily. 09/23/20   Duke Salvia, MD  fexofenadine (ALLEGRA) 180 MG tablet Take 180 mg by mouth daily.    [provider]  Fluocinonide Emulsified Base 0.05 % CREA APPLY TO AFFECTED AREA(S)  TWO TIMES DAILY AS NEEDED 01/16/19   [name], MD  folic acid (FOLVITE) 400 MCG tablet Take 400 mcg by mouth daily.    [provider]  losartan (COZAAR) 25 MG tablet TAKE 1 TABLET BY MOUTH  DAILY Patient taking differently: Take 12.5 mg by mouth in the morning and at bedtime. 09/23/20   Duke Salvia, MD  Lysine 500 MG TABS Take 500 mg by mouth 3 (three) times daily.    [provider]  Magnesium 250 MG TABS Take 250 mg by mouth 2 (two) times daily.     [provider]  metroNIDAZOLE (FLAGYL) 500 MG tablet Take 1 tablet (500 mg total) by mouth every 8 (eight) hours. 12/25/20   Calvert Cantor, MD  potassium chloride (KLOR-CON) 10 MEQ tablet TAKE 1 TABLET BY MOUTH  TWICE DAILY Patient taking differently: Take 10 mEq by mouth daily. 09/23/20   Duke Salvia, MD  ranitidine (ZANTAC) 300 MG tablet Take 1 tablet (300 mg total) by mouth at bedtime. Patient not taking: Reported on 12/17/2020 03/08/17   Jessica Priest, MD  ranolazine (RANEXA) 500 MG 12 hr tablet Take 1 tablet (500 mg total) by mouth 2 (two) times daily. 06/01/20   Duke Salvia, MD  traMADol (ULTRAM) 50 MG tablet 1-2 tablets every 6 hours as needed for pain Patient taking differently: Take 50-100 mg by mouth every 6 (six) hours as needed for moderate pain. 12/11/20  Charlesetta Shanks, MD    Allergies    Amiodarone, Niacin, Ace inhibitors, and Mometasone furoate  Review of Systems   Review of Systems  All other systems reviewed and are negative.   Physical Exam Updated Vital Signs BP 127/71   Pulse 67   Temp 97.6 F  (36.4 C)   Resp 20   SpO2 100%   Physical Exam Vitals and nursing note reviewed.  Constitutional:      Appearance: He is well-developed.  HENT:     Head: Normocephalic and atraumatic.  Cardiovascular:     Rate and Rhythm: Normal rate and regular rhythm.  Pulmonary:     Effort: Pulmonary effort is normal. No respiratory distress.  Abdominal:     Palpations: Abdomen is soft.     Tenderness: There is no guarding or rebound.     Comments: Moderate LLQ tenderness  Musculoskeletal:        General: No swelling or tenderness.  Skin:    General: Skin is warm and dry.  Neurological:     Mental Status: He is alert and oriented to person, place, and time.  Psychiatric:        Behavior: Behavior normal.     ED Results / Procedures / Treatments   Labs (all labs ordered are listed, but only abnormal results are displayed) Labs Reviewed  CBC - Abnormal; Notable for the following components:      Result Value   RBC 4.00 (*)    MCV 101.0 (*)    All other components within normal limits  LIPASE, BLOOD  COMPREHENSIVE METABOLIC PANEL  URINALYSIS, ROUTINE W REFLEX MICROSCOPIC    EKG EKG Interpretation  Date/Time:  Sunday Jan 16 2021 20:27:01 EDT Ventricular Rate:  68 PR Interval:  192 QRS Duration: 146 QT Interval:  489 QTC Calculation: 513 R Axis:   -33 Text Interpretation: Atrial-paced rhythm LVH with secondary repolarization abnormality Anterior infarct, old Prolonged QT interval Artifact in lead(s) I II aVR aVL aVF Confirmed by Quintella Reichert [phone]) on 01/16/2021 8:33:21 PM   Radiology CT Abdomen Pelvis W Contrast  Result Date: 01/16/2021 CLINICAL DATA:  Left lower quadrant pain EXAM: CT ABDOMEN AND PELVIS WITH CONTRAST TECHNIQUE: Multidetector CT imaging of the abdomen and pelvis was performed using the standard protocol following bolus administration of intravenous contrast. CONTRAST:  67mL OMNIPAQUE IOHEXOL 300 MG/ML  SOLN COMPARISON:  None. FINDINGS: LOWER CHEST: Normal.  HEPATOBILIARY: Normal hepatic contours. No intra- or extrahepatic biliary dilatation. The gallbladder is normal. PANCREAS: Normal pancreas. No ductal dilatation or peripancreatic fluid collection. SPLEEN: Normal. ADRENALS/URINARY TRACT: The adrenal glands are normal. No hydronephrosis, nephroureterolithiasis or solid renal mass. The urinary bladder is normal for degree of distention STOMACH/BOWEL: There is no hiatal hernia. Normal duodenal course and caliber. No small bowel dilatation or inflammation. There is rectosigmoid diverticulosis. Mild inflammatory stranding adjacent to the distal descending colon (3:64). The appendix is not visualized. No right lower quadrant inflammation or free fluid. VASCULAR/LYMPHATIC: There is calcific atherosclerosis of the abdominal aorta. No lymphadenopathy. REPRODUCTIVE: Normal prostate size with symmetric seminal vesicles. MUSCULOSKELETAL. Bilateral total hip arthroplasties. OTHER: None. IMPRESSION: 1. Mild inflammatory stranding adjacent to the distal descending colon, unchanged from 01/05/2021. No free intraperitoneal air or fluid collection. Aortic Atherosclerosis (ICD10-I70.0). Electronically Signed   By: Ulyses Jarred M.D.   On: 01/16/2021 20:14    Procedures Procedures   Medications Ordered in ED Medications  metroNIDAZOLE (FLAGYL) tablet 500 mg (has no administration in time range)  iohexol (OMNIPAQUE) 300 MG/ML  solution 75 mL (75 mLs Intravenous Contrast Given 01/16/21 1958)    ED Course  I have reviewed the triage vital signs and the nursing notes.  Pertinent labs & imaging results that were available during my care of the patient were reviewed by me and considered in my medical decision making (see chart for details).    MDM Rules/Calculators/A&P                         patient with recent hospitalization for acute diverticulitis with diverticular abscess here for evaluation of recurrent left lower quadrant pain. He does have focal tenderness on  examination, just lateral to where his prior drain was positioned. CBC, BMP within normal limits. CT scan is negative for recurrent abscess and is unchanged compared to most recent exam on April 27. Discussed with ID physician on call, will continue Flagyl until his follow-up appointment with ID on May 11. Discussed outpatient follow-up as well as return precautions.  Final Clinical Impression(s) / ED Diagnoses Final diagnoses:  Left lower quadrant abdominal pain    Rx / DC Orders ED Discharge Orders         Ordered    metroNIDAZOLE (FLAGYL) 500 MG tablet  3 times daily        01/16/21 2050           Quintella Reichert, MD 01/16/21 2057

## 2021-01-16 NOTE — ED Notes (Signed)
To ct

## 2021-01-17 ENCOUNTER — Telehealth: Payer: Self-pay | Admitting: Internal Medicine

## 2021-01-17 DIAGNOSIS — K5720 Diverticulitis of large intestine with perforation and abscess without bleeding: Secondary | ICD-10-CM | POA: Diagnosis not present

## 2021-01-17 DIAGNOSIS — Z792 Long term (current) use of antibiotics: Secondary | ICD-10-CM | POA: Diagnosis not present

## 2021-01-17 DIAGNOSIS — I480 Paroxysmal atrial fibrillation: Secondary | ICD-10-CM | POA: Diagnosis not present

## 2021-01-17 DIAGNOSIS — I2510 Atherosclerotic heart disease of native coronary artery without angina pectoris: Secondary | ICD-10-CM | POA: Diagnosis not present

## 2021-01-17 DIAGNOSIS — Z5181 Encounter for therapeutic drug level monitoring: Secondary | ICD-10-CM | POA: Diagnosis not present

## 2021-01-17 DIAGNOSIS — I5042 Chronic combined systolic (congestive) and diastolic (congestive) heart failure: Secondary | ICD-10-CM | POA: Diagnosis not present

## 2021-01-17 NOTE — Telephone Encounter (Signed)
Ok for verbals 

## 2021-01-17 NOTE — Telephone Encounter (Signed)
Beth from [facility] called   Reported last visit patient expressed discomfort in lower abdomen that worsens with movement. Urine has been dark and strong odor for a while.   Requesting verbal orders for a urine analysis to rule out a UTI.  Patient is scheduled for an appointment today at 3:00 PM.   Okay to LVM on [phone]

## 2021-01-17 NOTE — Telephone Encounter (Signed)
[facility] Call/Report: ---Caller states he was hospitalized with diverticulitis and abcess. The drain was removed on 4/27. 3 days ago, he started getting sore at the site where the drain site had been. No fever. Pain in in the left side of abdomen. It is between the hip bone and drain site, where the pain is. No redness. It may be swollen but it is hard to tell due to being so bloated since surgery. incision site is healed. Is getting daily iv and oral Antibiotics.  Advised: * Severe pain lasts over 1 hour * Constant pain lasts over 2 hours CALL BACK IF: * You become worse CARE ADVICE given per Abdominal Pain, Male (Adult) guideline.  PATIENT WENT TO ED!

## 2021-01-17 NOTE — Telephone Encounter (Signed)
Verbals left on Beth's VM.

## 2021-01-19 ENCOUNTER — Ambulatory Visit: Payer: Medicare Other | Admitting: Infectious Disease

## 2021-01-19 ENCOUNTER — Telehealth: Payer: Self-pay | Admitting: *Deleted

## 2021-01-19 ENCOUNTER — Encounter: Payer: Self-pay | Admitting: Infectious Disease

## 2021-01-19 ENCOUNTER — Other Ambulatory Visit: Payer: Self-pay

## 2021-01-19 VITALS — BP 102/64 | HR 60 | Temp 97.4°F | Wt 204.0 lb

## 2021-01-19 DIAGNOSIS — K5720 Diverticulitis of large intestine with perforation and abscess without bleeding: Secondary | ICD-10-CM | POA: Diagnosis not present

## 2021-01-19 DIAGNOSIS — I739 Peripheral vascular disease, unspecified: Secondary | ICD-10-CM | POA: Diagnosis not present

## 2021-01-19 DIAGNOSIS — N1831 Chronic kidney disease, stage 3a: Secondary | ICD-10-CM

## 2021-01-19 NOTE — Progress Notes (Signed)
Subjective:  Chief complaint anxiety about his diverticulitis  Patient ID: [name], male    DOB: 02/13/1941, 80 y.o.   MRN: [id number]  HPI  80 year old  male with multiple medical problems including atrial fibrillation, chronic combined heart failure coronary disease obstructive sleep apnea chronic kidney disease who presented to the med center at [facility] [facility] on the second.  Was found to have diverticulitis and discharged on oral Augmentin.  He had worsening abdominal pain and distention and return to the hospital on the 6.  He was found to have a diverticular perforation with an abscess.  Interventional radiology of drained abscess and is now growing E. Coli.  E. coli is resistant to all penicillins to which it was tested against and likely resistant to first generation cephalosporins given the increased MIC to cefazolin.  It is also fluoroquinolone resistant.  Note some budding yeast were seen on initial Gram stain but I have checked with microbiology again and there have been no yeast isolated only the E. coli.  I was concerned this could be ESBL not picked up by [facility] earlier conversations with Onnie Boer, Pharm D but Jimmy Footman Pharm D believes this is not ESBL but shv mediated R E coli  We will therefore switch to ceftriaxone 2 g IV daily along with oral metronidazole 500 mg 3 times daily.y.o. male with multiple medical problems including atrial fibrillation, chronic combined heart failure coronary disease obstructive sleep apnea chronic kidney disease who presented to the med center at [facility] on the second.  Was found to have diverticulitis and discharged on oral Augmentin.  He had worsening abdominal pain and distention and return to the hospital on the 6.  He was found to have a diverticular perforation with an abscess.  Interventional radiology of drained abscess and is now growing E. Coli.  E. coli is resistant to all penicillins to which it was tested  against and likely resistant to first generation cephalosporins given the increased MIC to cefazolin.  It is also fluoroquinolone resistant.  Note some budding yeast were seen on initial Gram stain but I have checked with microbiology again and there have been no yeast isolated only the E. coli.  I was concerned this could be ESBL not picked up by [facility] earlier conversations with Onnie Boer, Pharm D but Jimmy Footman Pharm D believes this is not ESBL but shv mediated R E coli  We will therefore did switch him to ceftriaxone 2 g IV daily along with oral metronidazole 500 mg 3 times daily.  He is continued to take his ceftriaxone and this past week had run out of oral metronidazole.  He experienced recurrence of left lower quadrant pain and was concerned that his diverticulitis was flaring again.  He went to the emergency department and was seen and had a repeat CT abdomen pelvis which I have also reviewed myself.  It showed resolution of his diverticular abscess with only some residual stranding along the descending colon.  Had additional metronidazole sent to him and his continue that along with his systemic ceftriaxone.  Other than the abdominal pain he had his left lower quadrant pain he has had no fevers chills or systemic symptoms.  His CT of the abdomen pelvis done in late April had already been quite reassuring resolution of the abscess at that time.   Past Medical History:  Diagnosis Date  . Allergic rhinitis    takes Allegra daily  . Allergic rhinitis, cause unspecified  06/22/2012  . Arthritis   . Atrial fibrillation ([place])    takes Tikosyn and Eliquis daily  . 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 Coronary artery disease    LAD stenting 2004 non-DES  . Depression    took Zoloft 62yr ago but nothing now  . Diverticulosis   . [place]   . Dysphagia    occasionally  . History of blood clots 2003   left leg prior to fem pop  . History of  colon polyps   . Hyperlipidemia    takes Tricor and Lipitor daily  . Insomnia    takes Ambien nightly as needed  . Joint pain   . Joint swelling   . 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 OSA (obstructive sleep apnea)    CPAP  . Pacemaker   . [place]ipheral vascular disease ([place]   . Pleurisy    early 847's . Prostatitis   . Urinary urgency     Past Surgical History:  Procedure Laterality Date  . CARDIAC CATHETERIZATION  2004  . CARDIAC CATHETERIZATION N/A 05/06/2015   Procedure: Left Heart Cath and Coronary Angiography;  Surgeon: HBelva Crome MD;  [facility];  Service: Cardiovascular;  Laterality: N/A;  . CARDIOVERSION N/A 12/18/2020   Procedure: CARDIOVERSION;  Surgeon: NJosue Hector MD;  [name];  Service: Cardiovascular;  Laterality: N/A;  . COLONOSCOPY    . hand and arm surgery Right    as a teenager  . HAND SURGERY Left   . INGUINAL HERNIA REPAIR Right   . IR RADIOLOGIST EVAL & MGMT  01/05/2021  . JOINT REPLACEMENT Left December 09, 2013   Left Hip   . JOINT REPLACEMENT Right Aug. 25, 2015   Right Hip  . left knee surgery    . PACEMAKER INSERTION     due to bradycardia  . PERMANENT PACEMAKER GENERATOR CHANGE N/A 06/17/2012   Procedure: PERMANENT PACEMAKER GENERATOR CHANGE;  Surgeon: SDeboraha Sprang MD;  [facility];  Service: Cardiovascular;  Laterality: N/A;  . right and left fem-pop bypass    . stent (unspec)     x2  . TOTAL HIP ARTHROPLASTY Left 12/09/2013   Procedure: TOTAL HIP ARTHROPLASTY ANTERIOR APPROACH;  Surgeon: PHessie Dibble MD;  [facility]  Service: Orthopedics;  Laterality: Left;  left anterior total hip arthroplasty  . TOTAL HIP ARTHROPLASTY Right 05/05/2014   Procedure: TOTAL HIP ARTHROPLASTY ANTERIOR APPROACH;  Surgeon: PHessie Dibble MD;  [facility] [facility]  Service: Orthopedics;  Laterality: Right;  . TURBINATE REDUCTION    . wisdom extracted       Family History  Problem Relation Age  of Onset  . Diabetes Other        family hx  . Sleep apnea Other        family hx  . Diabetes Mother   . Heart disease Mother        Before age 81 . Heart disease Father        Before age 81 . Heart attack Father   . Stroke Father   . Aneurysm Father        bilat pop art aneurysms  . Peripheral vascular disease Father        Popliteal Aneurysm  . Allergic rhinitis Neg Hx   . Angioedema Neg Hx   . Asthma Neg Hx   .  Eczema Neg Hx   . Immunodeficiency Neg Hx   . Urticaria Neg Hx       Social History   Socioeconomic History  . Marital status: Married    Spouse name: Not on file  . Number of children: Not on file  . Years of education: Not on file  . Highest education level: Not on file  Occupational History  . Not on file  Tobacco Use  . Smoking status: Never Smoker  . Smokeless tobacco: Never Used  Vaping Use  . Vaping Use: Never used  Substance and Sexual Activity  . Alcohol use: No    Alcohol/week: 0.0 standard drinks    Comment: nothing since 2000  . Drug use: No  . Sexual activity: Yes  Other Topics Concern  . 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    Social Determinants of Health   Financial Resource Strain: Low Risk   . Difficulty of Paying Living Expenses: Not hard at all  Food Insecurity: No Food Insecurity  . Worried About Charity fundraiser in the Last Year: Never true  . Ran Out of Food in the Last Year: Never true  Transportation Needs: No Transportation Needs  . Lack of Transportation (Medical): No  . Lack of Transportation (Non-Medical): No  Physical Activity: Sufficiently Active  . Days of Exercise per Week: 7 days  . Minutes of Exercise per Session: 60 min  Stress: No Stress Concern Present  . Feeling of Stress : Not at all  Social Connections: Socially Integrated  . Frequency of Communication with Friends and Family: More than three times a week  . Frequency of Social Gatherings with  Friends and Family: More than three times a week  . Attends Religious Services: More than 4 times per year  . Active Member of Clubs or Organizations: Yes  . Attends Archivist Meetings: More than 4 times per year  . Marital Status: Married    Allergies  Allergen Reactions  . Amiodarone Other (See Comments)    Peripheral neuropathy resulted  . Niacin Other (See Comments)    Caused an irregular heartbeat  . Ace Inhibitors Cough  . Mometasone Furoate Other (See Comments)    (Nasonex) Causes nasal bleeding and nose bleeds     Current Outpatient Medications:  .  atorvastatin (LIPITOR) 80 MG tablet, TAKE 1 TABLET BY MOUTH  DAILY (Patient taking differently: Take 80 mg by mouth daily.), Disp: 90 tablet, Rfl: 2 .  CALCIUM PO, Take 600 mg by mouth 2 (two) times daily., Disp: , Rfl:  .  carvedilol (COREG) 25 MG tablet, TAKE 2 TABLETS BY MOUTH  TWICE DAILY (Patient taking differently: Take 50 mg by mouth 2 (two) times daily with a meal.), Disp: 360 tablet, Rfl: 2 .  cefTRIAXone (ROCEPHIN) IVPB, Inject 2 g into the vein daily for 28 days. Indication:  Paradiverticular abscess  First Dose: Yes Last Day of Therapy:  01/21/21 Labs - Once weekly:  CBC/D and BMP, Labs - Every other week:  ESR and CRP Method of administration: IV Push Method of administration may be changed at the discretion of home infusion pharmacist based upon assessment of the patient and/or caregiver's ability to self-administer the medication ordered., Disp: 28 Units, Rfl: 0 .  Cholecalciferol (D3 VITAMIN PO), Take 4,000 Units by mouth daily., Disp: , Rfl:  .  Coenzyme Q10 (COQ-10 PO), Take 300 mg by mouth daily., Disp: , Rfl:  .  Cyanocobalamin (VITAMIN B 12) 500 MCG TABS, Take 500 mcg by mouth 3 (three) times a week., Disp: , Rfl:  .  diltiazem (CARDIZEM CD) 120 MG 24 hr capsule, Take 1 capsule (120 mg total) by mouth daily., Disp: 30 capsule, Rfl: 0 .  dofetilide (TIKOSYN) 500 MCG capsule, TAKE 1 CAPSULE BY MOUTH  TWICE  DAILY (Patient taking differently: Take 500 mcg by mouth 2 (two) times daily.), Disp: 180 capsule, Rfl: 2 .  ELIQUIS 5 MG TABS tablet, TAKE 1 TABLET BY MOUTH  TWICE DAILY (Patient taking differently: Take 5 mg by mouth 2 (two) times daily.), Disp: 180 tablet, Rfl: 2 .  fenofibrate 160 MG tablet, TAKE 1 TABLET BY MOUTH  DAILY (Patient taking differently: Take 160 mg by mouth daily.), Disp: 90 tablet, Rfl: 2 .  fexofenadine (ALLEGRA) 180 MG tablet, Take 180 mg by mouth daily., Disp: , Rfl:  .  Fluocinonide Emulsified Base 0.05 % CREA, APPLY TO AFFECTED AREA(S)  TWO TIMES DAILY AS NEEDED, Disp: 60 g, Rfl: 2 .  folic acid (FOLVITE) 953 MCG tablet, Take 400 mcg by mouth daily., Disp: , Rfl:  .  losartan (COZAAR) 25 MG tablet, TAKE 1 TABLET BY MOUTH  DAILY (Patient taking differently: Take 12.5 mg by mouth in the morning and at bedtime.), Disp: 90 tablet, Rfl: 2 .  Lysine 500 MG TABS, Take 500 mg by mouth 3 (three) times daily., Disp: , Rfl:  .  Magnesium 250 MG TABS, Take 250 mg by mouth 2 (two) times daily. , Disp: , Rfl:  .  metroNIDAZOLE (FLAGYL) 500 MG tablet, Take 1 tablet (500 mg total) by mouth 3 (three) times daily., Disp: 9 tablet, Rfl: 0 .  potassium chloride (KLOR-CON) 10 MEQ tablet, TAKE 1 TABLET BY MOUTH  TWICE DAILY (Patient taking differently: Take 10 mEq by mouth daily.), Disp: 180 tablet, Rfl: 2 .  ranitidine (ZANTAC) 300 MG tablet, Take 1 tablet (300 mg total) by mouth at bedtime., Disp: 90 tablet, Rfl: 2 .  ranolazine (RANEXA) 500 MG 12 hr tablet, Take 1 tablet (500 mg total) by mouth 2 (two) times daily., Disp: 180 tablet, Rfl: 3 .  traMADol (ULTRAM) 50 MG tablet, 1-2 tablets every 6 hours as needed for pain (Patient taking differently: Take 50-100 mg by mouth every 6 (six) hours as needed for moderate pain.), Disp: 20 tablet, Rfl: 0 .  metroNIDAZOLE (FLAGYL) 500 MG tablet, Take 1 tablet (500 mg total) by mouth every 8 (eight) hours. (Patient not taking: Reported on 01/19/2021), Disp: 48  tablet, Rfl: 0   Review of Systems  Constitutional: Negative for activity change, appetite change, chills, diaphoresis, fatigue, fever and unexpected weight change.  HENT: Negative for congestion, rhinorrhea, sinus pressure, sneezing, sore throat and trouble swallowing.   Eyes: Negative for photophobia and visual disturbance.  Respiratory: Negative for cough, chest tightness, shortness of breath, wheezing and stridor.   Cardiovascular: Negative for chest pain, palpitations and leg swelling.  Gastrointestinal: Positive for abdominal pain. Negative for abdominal distention, anal bleeding, blood in stool, constipation, diarrhea, nausea and vomiting.  Genitourinary: Negative for difficulty urinating, dysuria, flank pain and hematuria.  Musculoskeletal: Negative for arthralgias, back pain, gait problem, joint swelling and myalgias.  Skin: Negative for color change, pallor, rash and wound.  Neurological: Negative for dizziness, tremors, weakness and light-headedness.  Hematological: Negative for adenopathy. Does not bruise/bleed easily.  Psychiatric/Behavioral: Negative for agitation, behavioral problems, confusion, decreased concentration, dysphoric mood and sleep disturbance. The patient is nervous/anxious.  Objective:   Physical Exam Constitutional:      Appearance: He is well-developed.  HENT:     Head: Normocephalic and atraumatic.  Eyes:     Conjunctiva/sclera: Conjunctivae normal.  Cardiovascular:     Rate and Rhythm: Normal rate and regular rhythm.     Heart sounds: No murmur heard. No friction rub. No gallop.   Pulmonary:     Effort: Pulmonary effort is normal. No respiratory distress.     Breath sounds: Normal breath sounds. No stridor. No wheezing or rhonchi.  Abdominal:     General: Bowel sounds are normal. There is no distension.     Palpations: Abdomen is soft. There is no mass.     Tenderness: There is no abdominal tenderness.     Hernia: No hernia is present.   Musculoskeletal:        General: No tenderness. Normal range of motion.     Cervical back: Normal range of motion and neck supple.  Skin:    General: Skin is warm and dry.     Coloration: Skin is not pale.     Findings: No erythema or rash.  Neurological:     General: No focal deficit present.     Mental Status: He is alert and oriented to person, place, and time. Mental status is at baseline.  Psychiatric:        Mood and Affect: Mood normal.        Behavior: Behavior normal.        Thought Content: Thought content normal.        Judgment: Judgment normal.           Assessment & Plan:  Diverticulitis with diverticular abscess and E. coli found on culture with shv resistance pattern  His abscess has responded clinically and radiographically to his ceftriaxone and metronidazole.  We will discontinue his PICC line and discontinue antibiotics.  He does need a colonoscopy to ensure he does not have colon cancer.  He is hesitant to have resection of diseased colon as this is the first time he has diverticulitis.  He has been a lifelong avid consumer of nuts but is now no longer doing that and also avoiding seeds.  I agree with this approach and also with a avoiding popcorn.  I spent greater than 40 minutes with the patient including greater than 50% of time in face to face counsel of the patient and his wife regarding the nature of diverticulitis and the need for further work-up with colonoscopy and in coordination of his care.

## 2021-01-19 NOTE — Telephone Encounter (Signed)
Notified Debbie at [facility] that the patient's PICC was pulled at the office visit. Landis Gandy, RN

## 2021-01-19 NOTE — Progress Notes (Signed)
Per verbal order from Dr Tommy Medal, 46 cm Single Lumen Peripherally Inserted Central Catheter removed from right basilic, tip intact. No sutures present. RN confirmed length per chart. Dressing was clean and dry. Petroleum dressing applied with ace bandage. Pt advised no heavy lifting with this arm, leave dressing for 24 hours and call the office or seek emergent care if dressing becomes soaked with blood, swelling, or sharp pain presents. Patient verbalized understanding and agreement.  Patient's questions answered to their satisfaction. Patient tolerated procedure well, RN walked patient to check out. Pharmacy notified. Landis Gandy, RN

## 2021-01-20 DIAGNOSIS — I5042 Chronic combined systolic (congestive) and diastolic (congestive) heart failure: Secondary | ICD-10-CM | POA: Diagnosis not present

## 2021-01-20 DIAGNOSIS — K5720 Diverticulitis of large intestine with perforation and abscess without bleeding: Secondary | ICD-10-CM | POA: Diagnosis not present

## 2021-01-20 DIAGNOSIS — I2510 Atherosclerotic heart disease of native coronary artery without angina pectoris: Secondary | ICD-10-CM | POA: Diagnosis not present

## 2021-01-20 DIAGNOSIS — I480 Paroxysmal atrial fibrillation: Secondary | ICD-10-CM | POA: Diagnosis not present

## 2021-01-24 ENCOUNTER — Telehealth: Payer: Self-pay

## 2021-01-24 NOTE — Telephone Encounter (Signed)
Thanks for the note.  We will reach out to him to discuss colonoscopy.  He has been about 6 years since his last 1 he had a single subcentimeter adenoma.     [name], Can you contact this patient to get him in for my first available return office visit appointment.  Thank you 

## 2021-01-24 NOTE — Telephone Encounter (Signed)
Message sent to the schedulers to set up appt.  

## 2021-01-24 NOTE — Progress Notes (Signed)
Thanks for the note.  We will reach out to him to discuss colonoscopy.  He has been about 6 years since his last 1 he had a single subcentimeter adenoma.     Patty, Can you contact this patient to get him in for my first available return office visit appointment.  Thank you

## 2021-01-24 NOTE — Telephone Encounter (Signed)
-----   Message from Milus Banister, MD sent at 01/24/2021  7:30 AM EDT -----   ----- Message ----- From: Tommy Medal, Lavell Islam, MD Sent: 01/19/2021   5:12 PM EDT To: Milus Banister, MD  Dan this patient will be calling you to set up a time to be evaluated with colonoscopy had a diverticular abscess recently.  He is responded to antibiotic therapy and I just pulled his PICC line today

## 2021-02-03 ENCOUNTER — Encounter: Payer: Self-pay | Admitting: Internal Medicine

## 2021-02-03 ENCOUNTER — Ambulatory Visit: Payer: Medicare Other | Admitting: Internal Medicine

## 2021-02-03 ENCOUNTER — Other Ambulatory Visit: Payer: Self-pay

## 2021-02-03 VITALS — BP 116/66 | HR 60 | Ht 75.0 in | Wt 200.2 lb

## 2021-02-03 DIAGNOSIS — I447 Left bundle-branch block, unspecified: Secondary | ICD-10-CM | POA: Diagnosis not present

## 2021-02-03 DIAGNOSIS — I4891 Unspecified atrial fibrillation: Secondary | ICD-10-CM

## 2021-02-03 DIAGNOSIS — Z950 Presence of cardiac pacemaker: Secondary | ICD-10-CM

## 2021-02-03 DIAGNOSIS — I495 Sick sinus syndrome: Secondary | ICD-10-CM | POA: Diagnosis not present

## 2021-02-03 DIAGNOSIS — I5042 Chronic combined systolic (congestive) and diastolic (congestive) heart failure: Secondary | ICD-10-CM

## 2021-02-03 MED ORDER — ENTRESTO 24-26 MG PO TABS: 1.0000 | ORAL_TABLET | Freq: Two times a day (BID) | ORAL | 3 refills | Status: DC

## 2021-02-03 NOTE — Progress Notes (Signed)
Patient Care Team: Biagio Borg, MD as PCP - General (Internal Medicine) O'Neal, Cassie Freer, MD as PCP - Cardiology (Internal Medicine) Deboraha Sprang, MD as PCP - Electrophysiology (Cardiology) Deboraha Sprang, MD (Cardiology) Melrose Nakayama, MD as Consulting Physician (Orthopedic Surgery) Biagio Borg, MD as Consulting Physician (Internal Medicine) Marica Otter, [place] as Consulting Physician (Optometry)   HPI  [name] is a 80 y.o. 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   His pacemaker reached ERI 8/13 and he underwent generator replacement with a Biotronik device because of his CLS function  He takes dofetilide for his atrial fibrillation  Developed left bundle branch block.  Catheterization confirmed no interval progressive coronary disease his jailed LAD stent was patent.   2/22 he fell and tore his rotator cuff.  4/22  hospitalization for ruptured diverticulosis with abdominal abscess complicated by acute renal failure atrial fibrillation and congestive heart failure; very weak but gradually improving now back to work.  From a cardiovascular perspective there are number of issues.  #1 he reports worsening shortness of breath over the last 6-12 months notable with climbing hills or stairs.  This is without evidence of volume overload. The patient denies chest pain, nocturnal dyspnea, orthopnea or peripheral edema.  There have been no palpitations, lightheadedness or syncope.     Also increasing burden of atrial fibrillation.  In the past have discussed catheter ablation.  Now that his ejection fraction is down he wonders whether, and he brings this up, in the context of [place] HF is appropriate to consider catheter ablation again  .      DATE TEST EF   8/16 LHC  LADp-stented-patent; LADm-60%;D1-90% jailed LCXp-45%;RCAp-40%^  8/16  Echo  30-35%   3/17 Echo 35-40%   6 /17 +Echo   40-45 %   10/17 Echo  40-45  %   4/18 Echo   45%   9/19 Echo  50-55%   10/20 Echo  50-55%   2/22 Myo 20% No ischemia  3/22 Echo 40-45%    Date Cr K Hgb  4/18 1.08 4.7 13.8  9/19 1.15 4.7 12.7  10/20 1.20 4.8 14.3  5/22 1.03 4.4 13.4     Past Medical History:  Diagnosis Date  . Allergic rhinitis    takes Allegra daily  . Allergic rhinitis, cause unspecified 06/22/2012  . Arthritis   . Atrial fibrillation ([place])    takes Tikosyn and Eliquis daily  . 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 Coronary artery disease    LAD stenting 2004 non-DES  . Depression    took Zoloft 35yrs ago but nothing now  . Diverticulosis   . DVT (deep venous thrombosis) ([place])   . Dysphagia    occasionally  . History of blood clots 2003   left leg prior to fem pop  . History of colon polyps   . Hyperlipidemia    takes Tricor and Lipitor daily  . Insomnia    takes Ambien nightly as needed  . Joint pain   . Joint swelling   . 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 OSA (obstructive sleep apnea)    CPAP  . Pacemaker   . Peripheral vascular disease ([place])   . Pleurisy    early 43's  . Prostatitis   . Urinary urgency  Past Surgical History:  Procedure Laterality Date  . CARDIAC CATHETERIZATION  2004  . CARDIAC CATHETERIZATION N/A 05/06/2015   Procedure: Left Heart Cath and Coronary Angiography;  Surgeon: Belva Crome, MD;  Location: [place] [facility];  Service: Cardiovascular;  Laterality: N/A;  . CARDIOVERSION N/A 12/18/2020   Procedure: CARDIOVERSION;  Surgeon: Josue Hector, MD;  [facility];  Service: Cardiovascular;  Laterality: N/A;  . COLONOSCOPY    . hand and arm surgery Right    as a teenager  . HAND SURGERY Left   . INGUINAL HERNIA REPAIR Right   . IR RADIOLOGIST EVAL & MGMT  01/05/2021  . JOINT REPLACEMENT Left December 09, 2013   Left Hip   . JOINT REPLACEMENT Right Aug. 25, 2015   Right Hip  .  left knee surgery    . PACEMAKER INSERTION     due to bradycardia  . PERMANENT PACEMAKER GENERATOR CHANGE N/A 06/17/2012   Procedure: PERMANENT PACEMAKER GENERATOR CHANGE;  Surgeon: Deboraha Sprang, MD;  Location: [facility] CATH LAB;  Service: Cardiovascular;  Laterality: N/A;  . right and left fem-pop bypass    . stent (unspec)     x2  . TOTAL HIP ARTHROPLASTY Left 12/09/2013   Procedure: TOTAL HIP ARTHROPLASTY ANTERIOR APPROACH;  Surgeon: Hessie Dibble, MD;  Location: [place];  Service: Orthopedics;  Laterality: Left;  left anterior total hip arthroplasty  . TOTAL HIP ARTHROPLASTY Right 05/05/2014   Procedure: TOTAL HIP ARTHROPLASTY ANTERIOR APPROACH;  Surgeon: Hessie Dibble, MD;  Location: [place];  Service: Orthopedics;  Laterality: Right;  . TURBINATE REDUCTION    . wisdom extracted       Current Outpatient Medications  Medication Sig Dispense Refill  . atorvastatin (LIPITOR) 80 MG tablet TAKE 1 TABLET BY MOUTH  DAILY 90 tablet 2  . CALCIUM PO Take 600 mg by mouth 2 (two) times daily.    . carvedilol (COREG) 25 MG tablet TAKE 2 TABLETS BY MOUTH  TWICE DAILY 360 tablet 2  . Cholecalciferol (D3 VITAMIN PO) Take 4,000 Units by mouth daily.    . Coenzyme Q10 (COQ-10 PO) Take 300 mg by mouth daily.    . Cyanocobalamin (VITAMIN B 12) 500 MCG TABS Take 500 mcg by mouth 3 (three) times a week.    . dofetilide (TIKOSYN) 500 MCG capsule TAKE 1 CAPSULE BY MOUTH  TWICE DAILY (Patient taking differently: TAKE 1 CAPSULE BY MOUTH  TWICE DAILY) 180 capsule 2  . ELIQUIS 5 MG TABS tablet TAKE 1 TABLET BY MOUTH  TWICE DAILY 180 tablet 2  . fenofibrate 160 MG tablet TAKE 1 TABLET BY MOUTH  DAILY 90 tablet 2  . fexofenadine (ALLEGRA) 180 MG tablet Take 180 mg by mouth daily.    . Fluocinonide Emulsified Base 0.05 % CREA APPLY TO AFFECTED AREA(S)  TWO TIMES DAILY AS NEEDED 60 g 2  . folic acid (FOLVITE) 315 MCG tablet Take 400 mcg by mouth daily.    Marland Kitchen losartan (COZAAR) 25 MG tablet TAKE 1 TABLET BY MOUTH  DAILY  90 tablet 2  . Lysine 500 MG TABS Take 500 mg by mouth 3 (three) times daily.    . Magnesium 250 MG TABS Take 250 mg by mouth 2 (two) times daily.     . potassium chloride (MICRO-K) 10 MEQ CR capsule Take 10 mEq by mouth 2 (two) times daily.    . ranolazine (RANEXA) 500 MG 12 hr tablet Take 1 tablet (500 mg total) by  mouth 2 (two) times daily. 180 tablet 3   No current facility-administered medications for this visit.    Allergies  Allergen Reactions  . Amiodarone Other (See Comments)    Peripheral neuropathy resulted  . Niacin Other (See Comments)    Caused an irregular heartbeat  . Ace Inhibitors Cough  . Mometasone Furoate Other (See Comments)    (Nasonex) Causes nasal bleeding and nose bleeds    Review of Systems negative except from HPI and PMH  Physical Exam BP 116/66   Pulse 60   Ht 6\' 3"  (1.905 m)   Wt 200 lb 3.2 oz (90.8 kg)   SpO2 99%   BMI 25.02 kg/m  Well developed and well nourished in no acute distress HENT normal Neck supple with JVP-flat Clear Device pocket well healed; without hematoma or erythema.  There is no tethering  Regular rate and rhythm, no murmur Abd-soft with active BS No Clubbing cyanosis  edema Skin-warm and dry A & Oriented  Grossly normal sensory and motor function  ECG A paced 60 20/14/49   Assessment and  Plan  Atrial fibrillation-paroxysmal  Symptomatic ventricular pacing  Pacemaker Biotronik        LBBB   Nonischemic cardiomyopathy recurrent  Coronary artery disease A) prior stenting B) diffuse nonobstructive disease  Congestive heart failure acute/chronic class IIb    Atrial fibrillation burden 36>>24 % with variable ventricular response it is hard to assess the contributing factors given how sick he was in April.  We will not make any medication adjustments for rate at this time until we have more data.  We also discussed again catheter ablation as an alternative to drugs and the CABANA trial and the [place]     Will arrange for Consult with JA but will hold off for a couple months until he is more fully recovered from his recent hospitalization.  Not withstanding his age, is a reasonable consideration.  For now we will continue him on ranolazine 500 in addition to his dofetilide 500 twice daily.  Electrolytes are in range and electrocardiogram is without excess QT prolongation  CHF acute is somewhat worse.  We discussed medication options including the addition of spironolactone, the change from losartan to [facility] and the role of an SGLT2.  For now, we will discontinue the losartan and begin him on Entresto 24/26.  We will have him come back in about 2 weeks time for metabolic profile and reassessment and, if tolerating the [facility], we will begin him on an SGLT2 either [place] or [place] depending on his insurance.  Thereafter, we will begin spironolactone as the data are more about hospitalizations and mortality in this patient group.  No evidence of ischemia.  Ranolazine is being used adjunctively as an anti-ischemic        I,Mathew Stumpf,acting as a scribe for Virl Axe, MD.,have documented all relevant documentation on the behalf of Virl Axe, MD,as directed by  Virl Axe, MD while in the presence of Virl Axe, MD.  I, Virl Axe, MD, have reviewed all documentation for this visit. The documentation on 02/03/21 for the exam, diagnosis, procedures, and orders are all accurate and complete.

## 2021-02-03 NOTE — Patient Instructions (Addendum)
Medication Instructions:  Your physician has recommended you make the following change in your medication:   ** Stop Losartan  ** Begin Entresto 24/26 - 1 tablet by mouth twice daily by mouth  *If you need a refill on your cardiac medications before your next appointment, please call your pharmacy*   Lab Work: None ordered.  If you have labs (blood work) drawn today and your tests are completely normal, you will receive your results only by: [place] MyChart Message (if you have MyChart) OR . A paper copy in the mail If you have any lab test that is abnormal or we need to change your treatment, we will call you to review the results.   Testing/Procedures: None ordered.    Follow-Up: At [facility], you and your health needs are our priority.  As part of our continuing mission to provide you with exceptional heart care, we have created designated Provider Care Teams.  These Care Teams include your primary Cardiologist (physician) and Advanced Practice Providers (APPs -  Physician Assistants and Nurse Practitioners) who all work together to provide you with the care you need, when you need it.  We recommend signing up for the patient portal called "MyChart".  Sign up information is provided on this After Visit Summary.  MyChart is used to connect with patients for Virtual Visits (Telemedicine).  Patients are able to view lab/test results, encounter notes, upcoming appointments, etc.  Non-urgent messages can be sent to your provider as well.   To learn more about what you can do with MyChart, go to [email or web address].    Your next appointment:    02/17/2021 at 1pm with Oda Kilts, PA-C

## 2021-02-17 ENCOUNTER — Ambulatory Visit: Payer: Medicare Other | Admitting: Student

## 2021-02-17 ENCOUNTER — Other Ambulatory Visit: Payer: Self-pay

## 2021-02-17 ENCOUNTER — Encounter: Payer: Self-pay | Admitting: Student

## 2021-02-17 VITALS — BP 98/58 | HR 59 | Ht 75.0 in | Wt 203.8 lb

## 2021-02-17 DIAGNOSIS — I5022 Chronic systolic (congestive) heart failure: Secondary | ICD-10-CM | POA: Diagnosis not present

## 2021-02-17 DIAGNOSIS — I4891 Unspecified atrial fibrillation: Secondary | ICD-10-CM | POA: Diagnosis not present

## 2021-02-17 DIAGNOSIS — I447 Left bundle-branch block, unspecified: Secondary | ICD-10-CM

## 2021-02-17 DIAGNOSIS — I495 Sick sinus syndrome: Secondary | ICD-10-CM | POA: Diagnosis not present

## 2021-02-17 MED ORDER — DAPAGLIFLOZIN PROPANEDIOL 10 MG PO TABS: 10.0000 mg | ORAL_TABLET | Freq: Every day | ORAL | 1 refills | Status: DC

## 2021-02-17 NOTE — Progress Notes (Signed)
Electrophysiology Office Note Date: 02/17/2021  ID:  [name], DOB [birth year], MRN [id number]  PCP: Biagio Borg, MD Primary Cardiologist: Evalina Field, MD Electrophysiologist: Virl Axe, MD   CC: Pacemaker follow-up  [name] is a 80 y.o. male seen today for Virl Axe, MD for routine electrophysiology followup.  Since last being seen in our clinic the patient reports doing about the same. He didn't initially read the instructions for [facility] and has only been taking BID for about 3 days. Denies lightheadedness or dizziness despite low BP. No syncope or near syncope. He continues to have NYHA III symptoms. Edema is currently well controlled off diuretics. He is not aware of being in or out of AF.   Device History: Ecologist PPM implanted 1998, gen change 2013   Past Medical History:  Diagnosis Date   Allergic rhinitis    takes Allegra daily   Allergic rhinitis, cause unspecified 06/22/2012   Arthritis    Atrial fibrillation ([place])    takes Tikosyn and Eliquis daily   Bilateral popliteal artery aneurysm ([facility])    Coronary artery disease    LAD stenting 2004 non-DES   Depression    took Zoloft 39yrs ago but nothing now   Diverticulosis    DVT (deep venous thrombosis) ([place])    Dysphagia    occasionally   History of blood clots 2003   left leg prior to fem pop   History of colon polyps    Hyperlipidemia    takes Tricor and Lipitor daily   Insomnia    takes Ambien nightly as needed   Joint pain    Joint swelling    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   OSA (obstructive sleep apnea)    CPAP   Pacemaker    Peripheral vascular disease ([place])    Pleurisy    early 80's   Prostatitis    Urinary urgency    Past Surgical History:  Procedure Laterality Date   CARDIAC CATHETERIZATION  2004   CARDIAC CATHETERIZATION N/A 05/06/2015   Procedure: Left Heart Cath and Coronary Angiography;  Surgeon: Belva Crome, MD;  Location:  [place] [facility] LAB;  Service: Cardiovascular;  Laterality: N/A;   CARDIOVERSION N/A 12/18/2020   Procedure: CARDIOVERSION;  Surgeon: Josue Hector, MD;  Location: [place];  Service: Cardiovascular;  Laterality: N/A;   COLONOSCOPY     hand and arm surgery Right    as a teenager   HAND SURGERY Left    INGUINAL HERNIA REPAIR Right    IR RADIOLOGIST EVAL & MGMT  01/05/2021   JOINT REPLACEMENT Left December 09, 2013   Left Hip    JOINT REPLACEMENT Right Aug. 25, 2015   Right Hip   left knee surgery     PACEMAKER INSERTION     due to bradycardia   PERMANENT PACEMAKER GENERATOR CHANGE N/A 06/17/2012   Procedure: PERMANENT PACEMAKER GENERATOR CHANGE;  Surgeon: Deboraha Sprang, MD;  Location: [facility];  Service: Cardiovascular;  Laterality: N/A;   right and left fem-pop bypass     stent (unspec)     x2   TOTAL HIP ARTHROPLASTY Left 12/09/2013   Procedure: TOTAL HIP ARTHROPLASTY ANTERIOR APPROACH;  Surgeon: Hessie Dibble, MD;  Location: [place];  Service: Orthopedics;  Laterality: Left;  left anterior total hip arthroplasty   TOTAL HIP ARTHROPLASTY Right 05/05/2014   Procedure: TOTAL HIP ARTHROPLASTY ANTERIOR APPROACH;  Surgeon:  Hessie Dibble, MD;  Location: [place];  Service: Orthopedics;  Laterality: Right;   TURBINATE REDUCTION     wisdom extracted       Current Outpatient Medications  Medication Sig Dispense Refill   atorvastatin (LIPITOR) 80 MG tablet TAKE 1 TABLET BY MOUTH  DAILY 90 tablet 2   CALCIUM PO Take 600 mg by mouth 2 (two) times daily.     carvedilol (COREG) 25 MG tablet TAKE 2 TABLETS BY MOUTH  TWICE DAILY 360 tablet 2   Cholecalciferol (D3 VITAMIN PO) Take 4,000 Units by mouth daily.     Coenzyme Q10 (COQ-10 PO) Take 300 mg by mouth daily.     Cyanocobalamin (VITAMIN B 12) 500 MCG TABS Take 500 mcg by mouth 3 (three) times a week.     dapagliflozin propanediol (FARXIGA) 10 MG TABS tablet Take 1 tablet (10 mg total) by mouth daily before breakfast. 90 tablet 1   dofetilide  (TIKOSYN) 500 MCG capsule TAKE 1 CAPSULE BY MOUTH  TWICE DAILY (Patient taking differently: TAKE 1 CAPSULE BY MOUTH  TWICE DAILY) 180 capsule 2   ELIQUIS 5 MG TABS tablet TAKE 1 TABLET BY MOUTH  TWICE DAILY 180 tablet 2   fenofibrate 160 MG tablet TAKE 1 TABLET BY MOUTH  DAILY 90 tablet 2   fexofenadine (ALLEGRA) 180 MG tablet Take 180 mg by mouth daily.     Fluocinonide Emulsified Base 0.05 % CREA APPLY TO AFFECTED AREA(S)  TWO TIMES DAILY AS NEEDED 60 g 2   folic acid (FOLVITE) 389 MCG tablet Take 400 mcg by mouth daily.     Lysine 500 MG TABS Take 500 mg by mouth 3 (three) times daily.     Magnesium 250 MG TABS Take 250 mg by mouth 2 (two) times daily.      potassium chloride (MICRO-K) 10 MEQ CR capsule Take 10 mEq by mouth 2 (two) times daily.     ranolazine (RANEXA) 500 MG 12 hr tablet Take 1 tablet (500 mg total) by mouth 2 (two) times daily. 180 tablet 3   sacubitril-valsartan (ENTRESTO) 24-26 MG Take 1 tablet by mouth 2 (two) times daily. 180 tablet 3   No current facility-administered medications for this visit.    Allergies:   Amiodarone, Niacin, Ace inhibitors, and Mometasone furoate   Social History: Social History   Socioeconomic History   Marital status: Married    Spouse name: Not on file   Number of children: Not on file   Years of education: Not on file   Highest education level: Not on file  Occupational History   Not on file  Tobacco Use   Smoking status: Never   Smokeless tobacco: Never  Vaping Use   Vaping Use: Never used  Substance and Sexual Activity   Alcohol use: No    Alcohol/week: 0.0 standard drinks    Comment: nothing since 2000   Drug use: No   Sexual activity: Yes  Other Topics Concern   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    Social Determinants of Health   Financial Resource Strain: Low Risk    Difficulty of Paying Living Expenses: Not hard at all  Food Insecurity: No Food Insecurity    Worried About Charity fundraiser in the Last Year: Never true   [place] in the Last Year: Never true  Transportation Needs: No Transportation Needs   Lack of Transportation (Medical):  No   Lack of Transportation (Non-Medical): No  Physical Activity: Sufficiently Active   Days of Exercise per Week: 7 days   Minutes of Exercise per Session: 60 min  Stress: No Stress Concern Present   Feeling of Stress : Not at all  Social Connections: Socially Integrated   Frequency of Communication with Friends and Family: More than three times a week   Frequency of Social Gatherings with Friends and Family: More than three times a week   Attends Religious Services: More than 4 times per year   Active Member of [employer] or Organizations: Yes   Attends Music therapist: More than 4 times per year   Marital Status: Married  Human resources officer Violence: Not on file    Family History: Family History  Problem Relation Age of Onset   Diabetes Other        family hx   Sleep apnea Other        family hx   Diabetes Mother    Heart disease Mother        Before age 34   Heart disease Father        Before age 16   Heart attack Father    Stroke Father    Aneurysm Father        bilat pop art aneurysms   Peripheral vascular disease Father        Popliteal Aneurysm   Allergic rhinitis Neg Hx    Angioedema Neg Hx    Asthma Neg Hx    Eczema Neg Hx    Immunodeficiency Neg Hx    Urticaria Neg Hx      Review of Systems: All other systems reviewed and are otherwise negative except as noted above.  Physical Exam: Vitals:   02/17/21 1324  BP: (!) 98/58  Pulse: (!) 59  SpO2: 98%  Weight: 203 lb 12.8 oz (92.4 kg)  Height: 6\' 3"  (1.905 m)     GEN- The patient is well appearing, alert and oriented x 3 today.   HEENT: normocephalic, atraumatic; sclera clear, conjunctiva pink; hearing intact; oropharynx clear; neck supple  Lungs- Clear to ausculation bilaterally, normal work of  breathing.  No wheezes, rales, rhonchi Heart- Regular rate and rhythm, no murmurs, rubs or gallops  GI- soft, non-tender, non-distended, bowel sounds present  Extremities- no clubbing or cyanosis. No edema MS- no significant deformity or atrophy Skin- warm and dry, no rash or lesion; PPM pocket well healed Psych- euthymic mood, full affect Neuro- strength and sensation are intact  PPM Interrogation- Not interrogated today. See discussion from Dr. Aquilla Hacker visit 02/03/2021.   EKG:  EKG is not ordered today.  Recent Labs: 06/23/2020: TSH 3.38 12/19/2020: Magnesium 2.1 01/16/2021: ALT 17; BUN 19; Creatinine, Ser 1.03; Hemoglobin 13.4; Platelets 200; Potassium 4.4; Sodium 139   Wt Readings from Last 3 Encounters:  02/17/21 203 lb 12.8 oz (92.4 kg)  02/03/21 200 lb 3.2 oz (90.8 kg)  01/19/21 204 lb (92.5 kg)     Other studies Reviewed: Additional studies/ records that were reviewed today include: Previous EP office notes, Previous remote checks, Most recent labwork.   Assessment and Plan:  1. Sick sinus syndrome s/p Biotronik PPM  Normal PPM function See Pace Art report No changes today  2. Chronic systolic CHF Echo 0/34/9179 LVEF 40-45% Functional status NYHA III, confounded by increased AF burden as of late, that has improved on tikosyn.  Continue Entresto 24/26 mg BID. He was only able to start this medication  at full dose several days ago.  Continue coreg 25 mg BID for now.  Labs today, then start [place] in 2 weeks. Follow up with me in 4.  Discussed with Dr. Aundra Dubin. Wilder Glade is unlikely to drop BP significantly even at current levels. If pt has increased diuretic affect he can liberalize hydration. If he does have significant BP change would stop.  Consider spironolactone, but I worry how his BP may tolerate this.   3. Paroxysmal atrial fibrillation Burden not assessed today with device check 2 weeks prior.  Will perform full device check at next visit.  He has pending  appointment with Dr. Rayann Heman towards the fall to discuss his candidacy for ablation.   Current medicines are reviewed at length with the patient today.   The patient does not have concerns regarding his medicines.  The following changes were made today:  Added [place]  Labs/ tests ordered today include:  Orders Placed This Encounter  Procedures   Basic metabolic panel   Magnesium    Disposition:   Follow up with EP APP in 4 Weeks . Patient aware plan may be altered based on labwork today.    Jacalyn Lefevre, PA-C  02/17/2021 2:19 PM  [address] [place] [phone] (office) [phone] (fax)

## 2021-02-17 NOTE — Patient Instructions (Signed)
Medication Instructions:  Your physician has recommended you make the following change in your medication:  START: Farxiga 10mg  daily (start in 2 weeks)  *If you need a refill on your cardiac medications before your next appointment, please call your pharmacy*   Lab Work: TODAY: BMET, Mg  If you have labs (blood work) drawn today and your tests are completely normal, you will receive your results only by: [place] (if you have MyChart) OR A paper copy in the mail If you have any lab test that is abnormal or we need to change your treatment, we will call you to review the results.    Follow-Up: At [facility], you and your health needs are our priority.  As part of our continuing mission to provide you with exceptional heart care, we have created designated Provider Care Teams.  These Care Teams include your primary Cardiologist (physician) and Advanced Practice Providers (APPs -  Physician Assistants and Nurse Practitioners) who all work together to provide you with the care you need, when you need it.   Your next appointment:   As scheduled

## 2021-02-18 LAB — BASIC METABOLIC PANEL
BUN/Creatinine Ratio: 15 (ref 10–24)
BUN: 17 mg/dL (ref 8–27)
CO2: 25 mmol/L (ref 20–29)
Calcium: 9.5 mg/dL (ref 8.6–10.2)
Chloride: 109 mmol/L — ABNORMAL HIGH (ref 96–106)
Creatinine, Ser: 1.15 mg/dL (ref 0.76–1.27)
Glucose: 94 mg/dL (ref 65–99)
Potassium: 4.7 mmol/L (ref 3.5–5.2)
Sodium: 146 mmol/L — ABNORMAL HIGH (ref 134–144)
eGFR: 64 mL/min/{1.73_m2} (ref >59)

## 2021-02-18 LAB — MAGNESIUM: Magnesium: 1.8 mg/dL (ref 1.6–2.3)

## 2021-02-21 ENCOUNTER — Telehealth: Payer: Self-pay | Admitting: Gastroenterology

## 2021-02-21 NOTE — Telephone Encounter (Signed)
Patient called states he is having a lot of diarrhea for a few weeks now and feel like there is something worst is highly concerns and is seeking to get advise.

## 2021-02-22 ENCOUNTER — Other Ambulatory Visit: Payer: Self-pay | Admitting: Internal Medicine

## 2021-02-22 DIAGNOSIS — E559 Vitamin D deficiency, unspecified: Secondary | ICD-10-CM

## 2021-02-22 DIAGNOSIS — E538 Deficiency of other specified B group vitamins: Secondary | ICD-10-CM

## 2021-02-22 DIAGNOSIS — Z0001 Encounter for general adult medical examination with abnormal findings: Secondary | ICD-10-CM

## 2021-02-22 DIAGNOSIS — E785 Hyperlipidemia, unspecified: Secondary | ICD-10-CM

## 2021-02-22 DIAGNOSIS — R739 Hyperglycemia, unspecified: Secondary | ICD-10-CM

## 2021-02-22 NOTE — Telephone Encounter (Signed)
The pt has had worse diarrhea over the past 2 weeks.  He was scheduled to see Dr Ardis Hughs in August but he does not think he can wait until then.  He has a history of acute diverticulitis see note below:  "patient with recent hospitalization for acute diverticulitis with diverticular abscess here for evaluation of recurrent left lower quadrant pain. He does have focal tenderness on examination, just lateral to where his prior drain was positioned. CBC, BMP within normal limits. CT scan is negative for recurrent abscess and is unchanged compared to most recent exam on April 27. Discussed with ID physician on call, will continue Flagyl until his follow-up appointment with ID on May 11. Discussed outpatient follow-up as well as return precautions."  Appt was made to see Ellouise Newer on 6/15. The pt has been advised.

## 2021-02-23 ENCOUNTER — Ambulatory Visit: Payer: Medicare Other | Admitting: Physician Assistant

## 2021-02-23 ENCOUNTER — Other Ambulatory Visit: Payer: Medicare Other

## 2021-02-23 ENCOUNTER — Encounter: Payer: Self-pay | Admitting: Physician Assistant

## 2021-02-23 VITALS — BP 88/40 | HR 58 | Ht 75.0 in | Wt 201.0 lb

## 2021-02-23 DIAGNOSIS — Z8719 Personal history of other diseases of the digestive system: Secondary | ICD-10-CM | POA: Diagnosis not present

## 2021-02-23 DIAGNOSIS — R197 Diarrhea, unspecified: Secondary | ICD-10-CM

## 2021-02-23 DIAGNOSIS — Z0389 Encounter for observation for other suspected diseases and conditions ruled out: Secondary | ICD-10-CM | POA: Diagnosis not present

## 2021-02-23 DIAGNOSIS — R194 Change in bowel habit: Secondary | ICD-10-CM

## 2021-02-23 DIAGNOSIS — A048 Other specified bacterial intestinal infections: Secondary | ICD-10-CM | POA: Diagnosis not present

## 2021-02-23 NOTE — Patient Instructions (Signed)
If you are age 81 or older, your body mass index should be between 23-30. Your Body mass index is 25.12 kg/m. If this is out of the aforementioned range listed, please consider follow up with your Primary Care Provider.  If you are age 26 or younger, your body mass index should be between 19-25. Your Body mass index is 25.12 kg/m. If this is out of the aformentioned range listed, please consider follow up with your Primary Care Provider.   Start Florastor 1 tablet twice for 2 months or align 1 tab once daily.    The [facility] providers would like to encourage you to use [facility] to communicate with providers for non-urgent requests or questions.  Due to long hold times on the telephone, sending your provider a message by [facility] may be a faster and more efficient way to get a response.  Please allow 48 business hours for a response.  Please remember that this is for non-urgent requests.  It was a pleasure to see you today!  Thank you for trusting me with your gastrointestinal care!     Ellouise Newer, PA-C

## 2021-02-23 NOTE — Progress Notes (Signed)
Chief Complaint: Diarrhea  Review of pertinent gastrointestinal problems: 1.  Precancerous colon polyps;  Colonoscopy July 2016 Dr. Ardis Hughs , done for routine screening found to small polyps one was a tubular adenoma. Also diverticulosis. He was recommended to have repeat colonoscopy 5 years; no recall recommended now due to age  HPI:    [name] is an 80 year old Caucasian male with a past medical history of A. fib, CAD, DVT, PVD on Eliquis (echo 11/24/2020 with LVEF 40-45%) as well as multiple others listed below, known to Dr. Ardis Hughs, who was referred to me by Biagio Borg, MD for a complaint of diarrhea.      09/29/2015 patient seen in clinic for external hemorrhoid.    01/16/2021 patient seen in the ED for left lower quadrant pain at that time it was noted that he was admitted in April, the 14th for diverticulitis complicated by abscess with drain placement.  Drain was removed on April 27 and he was discharged home on April 23.  He had been continuing antibiotics and PICC line with ceftriaxone daily.  Was also taking Flagyl.  At that time having some pain a few inches lateral to where the drain was removed which had been worsening over the past several days.  CBC, lipase, CMP and urinalysis normal.  CT abdomen pelvis with contrast showed mild inflammatory stranding adjacent to the distal descending colon, unchanged from 01/05/2021 with no free intraperitoneal air or fluid collection.  Patient given further Flagyl until his follow-up with ID on May 11.    01/19/2021 PICC line was removed.    01/24/2021 we got notice from infectious disease the patient will require a follow-up colonoscopy.    02/21/2021 patient called and discussed diarrhea worse over the past 2 weeks.    Today, the patient tells me that his GI system seemed to improve initially after having his PICC line removed but then it seemed to level off for 2 to 3 weeks telling me that he was having mostly formed stools, but now over the past 2  to 3 weeks it has seemed to backslide telling me that he has had diarrhea more often than not.  He will either have diarrhea up to 12 times a day which is liquid watery stool or sometimes just "soft piles".  Most recently over the past week he did have 2 formed stools this morning but they were small, yesterday he had 4 stools which were slightly looser.  Describes 5 incidents of incontinence because it was so urgent.  He has been wondering about starting a probiotic, apparently ordered 1 online which seemed to make things worse so he stopped it previously.  Has also been trying to increase fiber in his diet.  Does report a couple instances of some bright red blood on the toilet paper and some "pinkish looking stool".    Denies fever, chills, weight loss or abdominal pain.  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[facility])    Coronary artery disease    LAD stenting 2004 non-DES   Depression    took Zoloft 70yrs ago but nothing now   Diverticulosis    DVT (deep venous thrombosis) ([place])    Dysphagia    occasionally   History of blood clots 2003  left leg prior to fem pop   History of colon polyps    Hyperlipidemia    takes Tricor and Lipitor daily   Insomnia    takes Ambien nightly as needed   Joint pain    Joint swelling    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   OSA (obstructive sleep apnea)    CPAP   Pacemaker    Peripheral vascular disease ([place])    Pleurisy    early 80's   Prostatitis    Urinary urgency     Past Surgical History:  Procedure Laterality Date   CARDIAC CATHETERIZATION  2004   CARDIAC CATHETERIZATION N/A 05/06/2015   Procedure: Left Heart Cath and Coronary Angiography;  Surgeon: Belva Crome, MD;  Location: [facility];  Service:  Cardiovascular;  Laterality: N/A;   CARDIOVERSION N/A 12/18/2020   Procedure: CARDIOVERSION;  Surgeon: Josue Hector, MD;  Location: [facility];  Service: Cardiovascular;  Laterality: N/A;   COLONOSCOPY     hand and arm surgery Right    as a teenager   HAND SURGERY Left    INGUINAL HERNIA REPAIR Right    IR RADIOLOGIST EVAL & MGMT  01/05/2021   JOINT REPLACEMENT Left December 09, 2013   Left Hip    JOINT REPLACEMENT Right Aug. 25, 2015   Right Hip   left knee surgery     PACEMAKER INSERTION     due to bradycardia   PERMANENT PACEMAKER GENERATOR CHANGE N/A 06/17/2012   Procedure: PERMANENT PACEMAKER GENERATOR CHANGE;  Surgeon: Deboraha Sprang, MD;  Location: [facility];  Service: Cardiovascular;  Laterality: N/A;   right and left fem-pop bypass     stent (unspec)     x2   TOTAL HIP ARTHROPLASTY Left 12/09/2013   Procedure: TOTAL HIP ARTHROPLASTY ANTERIOR APPROACH;  Surgeon: Hessie Dibble, MD;  Location: [facility];  Service: Orthopedics;  Laterality: Left;  left anterior total hip arthroplasty   TOTAL HIP ARTHROPLASTY Right 05/05/2014   Procedure: TOTAL HIP ARTHROPLASTY ANTERIOR APPROACH;  Surgeon: Hessie Dibble, MD;  Location: [facility];  Service: Orthopedics;  Laterality: Right;   TURBINATE REDUCTION     wisdom extracted       Current Outpatient Medications  Medication Sig Dispense Refill   atorvastatin (LIPITOR) 80 MG tablet TAKE 1 TABLET BY MOUTH  DAILY 90 tablet 2   CALCIUM PO Take 600 mg by mouth 2 (two) times daily.     carvedilol (COREG) 25 MG tablet TAKE 2 TABLETS BY MOUTH  TWICE DAILY 360 tablet 2   Cholecalciferol (D3 VITAMIN PO) Take 4,000 Units by mouth daily.     Coenzyme Q10 (COQ-10 PO) Take 300 mg by mouth daily.     Cyanocobalamin (VITAMIN B 12) 500 MCG TABS Take 500 mcg by mouth 3 (three) times a week.     dapagliflozin propanediol (FARXIGA) 10 MG TABS tablet Take 1 tablet (10 mg total) by mouth daily before breakfast. 90 tablet 1   dofetilide (TIKOSYN) 500 MCG capsule TAKE 1  CAPSULE BY MOUTH  TWICE DAILY (Patient taking differently: TAKE 1 CAPSULE BY MOUTH  TWICE DAILY) 180 capsule 2   ELIQUIS 5 MG TABS tablet TAKE 1 TABLET BY MOUTH  TWICE DAILY 180 tablet 2   fenofibrate 160 MG tablet TAKE 1 TABLET BY MOUTH  DAILY 90 tablet 2   fexofenadine (ALLEGRA) 180 MG tablet Take 180 mg by mouth daily.     Fluocinonide  Emulsified Base 0.05 % CREA APPLY TO AFFECTED AREA(S)  TWO TIMES DAILY AS NEEDED 60 g 2   folic acid (FOLVITE) 448 MCG tablet Take 400 mcg by mouth daily.     Lysine 500 MG TABS Take 500 mg by mouth 3 (three) times daily.     Magnesium 250 MG TABS Take 250 mg by mouth 2 (two) times daily.      potassium chloride (MICRO-K) 10 MEQ CR capsule Take 10 mEq by mouth 2 (two) times daily.     ranolazine (RANEXA) 500 MG 12 hr tablet Take 1 tablet (500 mg total) by mouth 2 (two) times daily. 180 tablet 3   sacubitril-valsartan (ENTRESTO) 24-26 MG Take 1 tablet by mouth 2 (two) times daily. 180 tablet 3   No current facility-administered medications for this visit.    Allergies as of 02/23/2021 - Review Complete 02/17/2021  Allergen Reaction Noted   Amiodarone Other (See Comments) 01/07/2008   Niacin Other (See Comments)    Ace inhibitors Cough 01/07/2008   Mometasone furoate Other (See Comments) 02/21/2011    Family History  Problem Relation Age of Onset   Diabetes Other        family hx   Sleep apnea Other        family hx   Diabetes Mother    Heart disease Mother        Before age 64   Heart disease Father        Before age 9   Heart attack Father    Stroke Father    Aneurysm Father        bilat pop art aneurysms   Peripheral vascular disease Father        Popliteal Aneurysm   Allergic rhinitis Neg Hx    Angioedema Neg Hx    Asthma Neg Hx    Eczema Neg Hx    Immunodeficiency Neg Hx    Urticaria Neg Hx     Social History   Socioeconomic History   Marital status: Married    Spouse name: Not on file   Number of children: Not on file    Years of education: Not on file   Highest education level: Not on file  Occupational History   Not on file  Tobacco Use   Smoking status: Never   Smokeless tobacco: Never  Vaping Use   Vaping Use: Never used  Substance and Sexual Activity   Alcohol use: No    Alcohol/week: 0.0 standard drinks    Comment: nothing since 2000   Drug use: No   Sexual activity: Yes  Other Topics Concern   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    Social Determinants of Health   Financial Resource Strain: Low Risk    Difficulty of Paying Living Expenses: Not hard at all  Food Insecurity: No Food Insecurity   Worried About Charity fundraiser in the Last Year: Never true   [place] in the Last Year: Never true  Transportation Needs: No Transportation Needs   Lack of Transportation (Medical): No   Lack of Transportation (Non-Medical): No  Physical Activity: Sufficiently Active   Days of Exercise per Week: 7 days   Minutes of Exercise per Session: 60 min  Stress: No Stress Concern Present   Feeling of Stress : Not at all  Social Connections: Socially Integrated   Frequency of Communication with Friends and Family: More than  three times a week   Frequency of Social Gatherings with Friends and Family: More than three times a week   Attends Religious Services: More than 4 times per year   Active Member of Clubs or Organizations: Yes   Attends Music therapist: More than 4 times per year   Marital Status: Married  Human resources officer Violence: Not on file    Review of Systems:    Constitutional: No fever or chills Skin: No rash Cardiovascular: No chest pain  Respiratory: No SOB  Gastrointestinal: See HPI and otherwise negative Genitourinary: No dysuria  Neurological: No headache, dizziness or syncope Musculoskeletal: No new muscle or joint pain Hematologic: No bruising Psychiatric: No history of depression or anxiety    Physical Exam:  Vital signs: BP (!) 88/40 Comment: checked 3 times  Pulse (!) 58   Ht 6\' 3"  (1.905 m)   Wt 201 lb (91.2 kg)   SpO2 95%   BMI 25.12 kg/m    Constitutional:   Pleasant Caucasian male appears to be in NAD, Well developed, Well nourished, alert and cooperative Head:  Normocephalic and atraumatic. Eyes:   PEERL, EOMI. No icterus. Conjunctiva pink. Ears:  Normal auditory acuity. Neck:  Supple Throat: Oral cavity and pharynx without inflammation, swelling or lesion.  Respiratory: Respirations even and unlabored. Lungs clear to auscultation bilaterally.   No wheezes, crackles, or rhonchi.  Cardiovascular: Normal S1, S2. No MRG. Regular rate and rhythm. No peripheral edema, cyanosis or pallor.  Gastrointestinal:  Soft, nondistended, nontender. No rebound or guarding. Normal bowel sounds. No appreciable masses or hepatomegaly. Rectal:  Not performed.  Msk:  Symmetrical without gross deformities. Without edema, no deformity or joint abnormality.  Neurologic:  Alert and  oriented x4;  grossly normal neurologically.  Skin:   Dry and intact without significant lesions or rashes. Psychiatric: Demonstrates good judgement and reason without abnormal affect or behaviors.  RELEVANT LABS AND IMAGING: CBC    Component Value Date/Time   WBC 6.2 01/16/2021 1745   RBC 4.00 (L) 01/16/2021 1745   HGB 13.4 01/16/2021 1745   HGB 14.3 07/08/2019 1455   HCT 40.4 01/16/2021 1745   HCT 42.0 07/08/2019 1455   PLT 200 01/16/2021 1745   PLT 154 07/08/2019 1455   MCV 101.0 (H) 01/16/2021 1745   MCV 96 07/08/2019 1455   MCH 33.5 01/16/2021 1745   MCHC 33.2 01/16/2021 1745   RDW 14.7 01/16/2021 1745   RDW 12.3 07/08/2019 1455   LYMPHSABS 0.8 12/11/2020 1828   LYMPHSABS 1.4 05/14/2018 1341   MONOABS 0.7 12/11/2020 1828   EOSABS 0.0 12/11/2020 1828   EOSABS 0.3 05/14/2018 1341   BASOSABS 0.0 12/11/2020 1828   BASOSABS 0.0 05/14/2018 1341    CMP     Component Value Date/Time   NA 146  (H) 02/17/2021 1353   K 4.7 02/17/2021 1353   CL 109 (H) 02/17/2021 1353   CO2 25 02/17/2021 1353   GLUCOSE 94 02/17/2021 1353   GLUCOSE 89 01/16/2021 1745   BUN 17 02/17/2021 1353   CREATININE 1.15 02/17/2021 1353   CREATININE 1.18 08/27/2015 1518   CALCIUM 9.5 02/17/2021 1353   PROT 6.9 01/16/2021 1745   ALBUMIN 3.6 01/16/2021 1745   AST 21 01/16/2021 1745   ALT 17 01/16/2021 1745   ALKPHOS 42 01/16/2021 1745   BILITOT 0.6 01/16/2021 1745   GFRNONAA >60 01/16/2021 1745   GFRAA 66 07/08/2019 1455    Assessment: 1.  Change in bowel habits: As below 2.  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  Recent diverticulitis: Hospitalized in April with drain placement and was on IV antibiotics via PICC line after discharge, no further abdominal pain.  Plan: 1.  Patient will require a colonoscopy following his complicated episode of diverticulitis.  This will need to be scheduled in the [place] with Dr. Ardis Hughs.  Patient will need to be off of his Eliquis for 2 days prior to time of procedure.  We will need to communicate with his prescribing physician to ensure this is acceptable for him. 2.  We will wait to schedule colonoscopy until results of GI pathogen panel ordered today.  Discussed this with the patient today. 3.  Recommend the patient buy a probiotic such as Florastor and take 1 tab twice daily for the next 2 months. 4.  Discussed a high-fiber diet, 25-35 g/day as well as water and maintaining regular bowel movements to the best of his ability to try and avoid future episodes of diverticulitis. 5.  Patient to follow in clinic per recommendations after stool studies.  Ellouise Newer, PA-C [facility] 02/23/2021, 10:08 AM  Cc: Biagio Borg, MD

## 2021-02-27 LAB — GI PROFILE, STOOL, PCR

## 2021-02-27 NOTE — Progress Notes (Signed)
I agree with the above note, plan 

## 2021-02-28 ENCOUNTER — Other Ambulatory Visit: Payer: Self-pay

## 2021-02-28 MED ORDER — VANCOMYCIN HCL 125 MG PO CAPS: 125.0000 mg | ORAL_CAPSULE | Freq: Four times a day (QID) | ORAL | 0 refills | Status: DC

## 2021-03-09 ENCOUNTER — Telehealth: Payer: Self-pay

## 2021-03-09 NOTE — Telephone Encounter (Signed)
[facility] Pre-operative Risk Assessment     Request for surgical clearance:     Endoscopy Procedure  What type of surgery is being performed?     Colonoscopy  When is this surgery scheduled?     04/25/21  What type of clearance is required ?   Pharmacy  Are there any medications that need to be held prior to surgery and how long? Eliquis - 2 days  Practice name and name of physician performing surgery?      [facility]  What is your office phone and fax number?      Phone- [phone]  Fax661 [phone]  Anesthesia type (None, local, MAC, general) ?       MAC

## 2021-03-10 NOTE — Telephone Encounter (Signed)
   Name: [name]  DOB: [birth year]  MRN: [id number]   Primary Cardiologist: Evalina Field, MD  Chart reviewed as part of pre-operative protocol coverage.   Per our clinical pharmacist: Per office protocol, patient can hold Eliquis for 2 days prior to procedure   I will route this recommendation to the requesting party via [place] fax function and remove from pre-op pool. Please call with questions.  [place], PA 03/10/2021, 2:11 PM

## 2021-03-10 NOTE — Telephone Encounter (Signed)
Patient with diagnosis of A Fib on Eliquis for anticoagulation.    Procedure: colonoscopy Date of procedure: 04/25/21   CHA2DS2-VASc Score = 4  This indicates a 4.8% annual risk of stroke. The patient's score is based upon: CHF History: Yes HTN History: No Diabetes History: No Stroke History: No Vascular Disease History: Yes Age Score: 2 Gender Score: 0    CrCl 65 mL/min Platelet count 200K  Per office protocol, patient can hold Eliquis for 2 days prior to procedure.

## 2021-03-11 DIAGNOSIS — M6281 Muscle weakness (generalized): Secondary | ICD-10-CM | POA: Diagnosis not present

## 2021-03-11 DIAGNOSIS — M25611 Stiffness of right shoulder, not elsewhere classified: Secondary | ICD-10-CM | POA: Diagnosis not present

## 2021-03-11 DIAGNOSIS — M67911 Unspecified disorder of synovium and tendon, right shoulder: Secondary | ICD-10-CM | POA: Diagnosis not present

## 2021-03-15 ENCOUNTER — Encounter: Payer: Self-pay | Admitting: Student

## 2021-03-15 ENCOUNTER — Other Ambulatory Visit: Payer: Self-pay

## 2021-03-15 ENCOUNTER — Ambulatory Visit: Payer: Medicare Other | Admitting: Student

## 2021-03-15 VITALS — BP 100/76 | HR 79 | Ht 75.0 in | Wt 201.0 lb

## 2021-03-15 DIAGNOSIS — I495 Sick sinus syndrome: Secondary | ICD-10-CM | POA: Diagnosis not present

## 2021-03-15 DIAGNOSIS — I4891 Unspecified atrial fibrillation: Secondary | ICD-10-CM | POA: Diagnosis not present

## 2021-03-15 DIAGNOSIS — I447 Left bundle-branch block, unspecified: Secondary | ICD-10-CM

## 2021-03-15 DIAGNOSIS — I5022 Chronic systolic (congestive) heart failure: Secondary | ICD-10-CM

## 2021-03-15 LAB — CUP PACEART INCLINIC DEVICE CHECK
Date Time Interrogation Session: 20220705125237
Implantable Lead Implant Date: 19980115
Implantable Lead Implant Date: 19980115
Implantable Lead Location: 753859
Implantable Lead Location: 753860
Implantable Pulse Generator Implant Date: 20131007
Lead Channel Impedance Value: 331 Ohm
Lead Channel Impedance Value: 370 Ohm
Lead Channel Pacing Threshold Amplitude: 0.7 V
Lead Channel Pacing Threshold Amplitude: 0.7 V
Lead Channel Pacing Threshold Amplitude: 0.9 V
Lead Channel Pacing Threshold Amplitude: 0.9 V
Lead Channel Pacing Threshold Pulse Width: 0.4 ms
Lead Channel Pacing Threshold Pulse Width: 0.4 ms
Lead Channel Pacing Threshold Pulse Width: 0.4 ms
Lead Channel Pacing Threshold Pulse Width: 0.4 ms
Lead Channel Sensing Intrinsic Amplitude: 2.9 mV
Lead Channel Sensing Intrinsic Amplitude: 3.5 mV
Lead Channel Setting Pacing Amplitude: 2 V
Lead Channel Setting Pacing Amplitude: 2.5 V
Lead Channel Setting Pacing Pulse Width: 0.4 ms
Pulse Gen Serial Number: 66339555

## 2021-03-15 NOTE — Progress Notes (Signed)
Electrophysiology Office Note Date: 03/15/2021  ID:  [name], DOB [birth year], MRN [id number]  PCP: Biagio Borg, MD Primary Cardiologist: Evalina Field, MD Electrophysiologist: Virl Axe, MD   CC: Pacemaker follow-up  [name] is a 80 y.o. male seen today for Virl Axe, MD for routine electrophysiology followup.  Since last being seen in our clinic the patient reports overall doing very well.   He feels like he has had less atrial fibrillation and his symptoms are overall stable. Currently denies SOB with ADLs. Edema is well controlled at this time. No lightheadedness or dizziness .   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[facility])    Coronary artery disease    LAD stenting 2004 non-DES   Depression    took Zoloft 6yrs ago but nothing now   Diverticulosis    DVT (deep venous thrombosis) ([place])    Dysphagia    occasionally   History of blood clots 2003   left leg prior to fem pop   History of colon polyps    Hyperlipidemia    takes Tricor and Lipitor daily   Insomnia    takes Ambien nightly as needed   Joint pain    Joint swelling    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   OSA (obstructive sleep apnea)    CPAP   Pacemaker    Peripheral vascular disease ([place])    Pleurisy    early 80's   Prostatitis    Urinary urgency    Past Surgical History:  Procedure Laterality Date   CARDIAC CATHETERIZATION  2004   CARDIAC CATHETERIZATION N/A 05/06/2015   Procedure: Left Heart Cath and Coronary Angiography;  Surgeon: Belva Crome, MD;  Location: [facility];  Service: Cardiovascular;  Laterality: N/A;   CARDIOVERSION N/A 12/18/2020   Procedure:  CARDIOVERSION;  Surgeon: Josue Hector, MD;  Location: [facility];  Service: Cardiovascular;  Laterality: N/A;   COLONOSCOPY     hand and arm surgery Right    as a teenager   HAND SURGERY Left    INGUINAL HERNIA REPAIR Right    IR RADIOLOGIST EVAL & MGMT  01/05/2021   JOINT REPLACEMENT Left December 09, 2013   Left Hip    JOINT REPLACEMENT Right Aug. 25, 2015   Right Hip   left knee surgery     PACEMAKER INSERTION     due to bradycardia   PERMANENT PACEMAKER GENERATOR CHANGE N/A 06/17/2012   Procedure: PERMANENT PACEMAKER GENERATOR CHANGE;  Surgeon: Deboraha Sprang, MD;  Location: [facility];  Service: Cardiovascular;  Laterality: N/A;   right and left fem-pop bypass     stent (unspec)     x2   TOTAL HIP ARTHROPLASTY Left 12/09/2013   Procedure: TOTAL HIP ARTHROPLASTY ANTERIOR APPROACH;  Surgeon: Hessie Dibble, MD;  Location: [facility];  Service: Orthopedics;  Laterality: Left;  left anterior total hip arthroplasty   TOTAL HIP ARTHROPLASTY Right 05/05/2014   Procedure: TOTAL HIP ARTHROPLASTY ANTERIOR APPROACH;  Surgeon: Hessie Dibble, MD;  Location: [facility];  Service: Orthopedics;  Laterality: Right;   TURBINATE REDUCTION  wisdom extracted       Current Outpatient Medications  Medication Sig Dispense Refill   atorvastatin (LIPITOR) 80 MG tablet TAKE 1 TABLET BY MOUTH  DAILY 90 tablet 2   CALCIUM PO Take 600 mg by mouth 2 (two) times daily.     carvedilol (COREG) 25 MG tablet TAKE 2 TABLETS BY MOUTH  TWICE DAILY 360 tablet 2   Cholecalciferol (D3 VITAMIN PO) Take 4,000 Units by mouth daily.     Coenzyme Q10 (COQ-10 PO) Take 300 mg by mouth daily.     Cyanocobalamin (VITAMIN B 12) 500 MCG TABS Take 500 mcg by mouth 3 (three) times a week.     dapagliflozin propanediol (FARXIGA) 10 MG TABS tablet Take 1 tablet (10 mg total) by mouth daily before breakfast. 90 tablet 1   dofetilide (TIKOSYN) 500 MCG capsule TAKE 1 CAPSULE BY MOUTH  TWICE DAILY (Patient taking differently: TAKE 1 CAPSULE BY  MOUTH  TWICE DAILY) 180 capsule 2   ELIQUIS 5 MG TABS tablet TAKE 1 TABLET BY MOUTH  TWICE DAILY 180 tablet 2   fenofibrate 160 MG tablet TAKE 1 TABLET BY MOUTH  DAILY 90 tablet 2   fexofenadine (ALLEGRA) 180 MG tablet Take 180 mg by mouth daily.     Fluocinonide Emulsified Base 0.05 % CREA APPLY TO AFFECTED AREA(S)  TWO TIMES DAILY AS NEEDED 60 g 2   folic acid (FOLVITE) 443 MCG tablet Take 400 mcg by mouth daily.     Lysine 500 MG TABS Take 500 mg by mouth 3 (three) times daily.     Magnesium 250 MG TABS Take 250 mg by mouth 2 (two) times daily.      potassium chloride (MICRO-K) 10 MEQ CR capsule Take 10 mEq by mouth 2 (two) times daily.     ranolazine (RANEXA) 500 MG 12 hr tablet Take 1 tablet (500 mg total) by mouth 2 (two) times daily. 180 tablet 3   sacubitril-valsartan (ENTRESTO) 24-26 MG Take 1 tablet by mouth 2 (two) times daily. 180 tablet 3   No current facility-administered medications for this visit.    Allergies:   Amiodarone, Niacin, Ace inhibitors, and Mometasone furoate   Social History: Social History   Socioeconomic History   Marital status: Married    Spouse name: Not on file   Number of children: Not on file   Years of education: Not on file   Highest education level: Not on file  Occupational History   Not on file  Tobacco Use   Smoking status: Never   Smokeless tobacco: Never  Vaping Use   Vaping Use: Never used  Substance and Sexual Activity   Alcohol use: No    Alcohol/week: 0.0 standard drinks    Comment: nothing since 2000   Drug use: No   Sexual activity: Yes  Other Topics Concern   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    Social Determinants of Health   Financial Resource Strain: Low Risk    Difficulty of Paying Living Expenses: Not hard at all  Food Insecurity: No Food Insecurity   Worried About Charity fundraiser in the Last Year: Never true   [place] in the Last Year: Never  true  Transportation Needs: No Transportation Needs   Lack of Transportation (Medical): No   Lack of Transportation (Non-Medical): No  Physical Activity: Sufficiently Active   Days of Exercise per Week: 7 days  Minutes of Exercise per Session: 60 min  Stress: No Stress Concern Present   Feeling of Stress : Not at all  Social Connections: Socially Integrated   Frequency of Communication with Friends and Family: More than three times a week   Frequency of Social Gatherings with Friends and Family: More than three times a week   Attends Religious Services: More than 4 times per year   Active Member of [employer] or Organizations: Yes   Attends Music therapist: More than 4 times per year   Marital Status: Married  Human resources officer Violence: Not on file    Family History: Family History  Problem Relation Age of Onset   Diabetes Other        family hx   Sleep apnea Other        family hx   Diabetes Mother    Heart disease Mother        Before age 61   Heart disease Father        Before age 48   Heart attack Father    Stroke Father    Aneurysm Father        bilat pop art aneurysms   Peripheral vascular disease Father        Popliteal Aneurysm   Allergic rhinitis Neg Hx    Angioedema Neg Hx    Asthma Neg Hx    Eczema Neg Hx    Immunodeficiency Neg Hx    Urticaria Neg Hx      Review of Systems: Review of systems complete and found to be negative unless listed in HPI.    Physical Exam: Vitals:   03/15/21 1015  BP: 100/76  Pulse: 79  SpO2: 99%  Weight: 201 lb (91.2 kg)  Height: 6\' 3"  (1.905 m)     General:  Well appearing. No resp difficulty. HEENT: Normal Neck: Supple. JVP 5-6. Carotids 2+ bilat; no bruits. No thyromegaly or nodule noted. Cor: PMI nondisplaced. RRR, No M/G/R noted Lungs: CTAB, normal effort. Abdomen: Soft, non-tender, non-distended, no HSM. No bruits or masses. +BS   Extremities: No cyanosis, clubbing, or rash. R and LLE no edema.   Neuro: Alert & orientedx3, cranial nerves grossly intact. moves all 4 extremities w/o difficulty. Affect pleasant   PPM Interrogation- Stable device function today. AF burden 20%. See PaceArt Report.   EKG:  EKG is ordered today shows A pacing with VS and intermittent PVCs. QRS 142 ms, QTc appears stable around 480-490 ms when measured manually and corrected based on QRS.  But will review with Dr. Caryl Comes with PVCs.  Recent Labs: 06/23/2020: TSH 3.38 01/16/2021: ALT 17; Hemoglobin 13.4; Platelets 200 02/17/2021: BUN 17; Creatinine, Ser 1.15; Magnesium 1.8; Potassium 4.7; Sodium 146   Wt Readings from Last 3 Encounters:  03/15/21 201 lb (91.2 kg)  02/23/21 201 lb (91.2 kg)  02/17/21 203 lb 12.8 oz (92.4 kg)     Other studies Reviewed: Additional studies/ records that were reviewed today include: Previous EP office notes, Previous remote checks, Most recent labwork.   Assessment and Plan:  1. Sick sinus syndrome s/p Biotronik PPM  Normal PPM function See Pace Art report No changes today  2. Chronic systolic CHF Echo 5/32/9924 LVEF 40-45% NYHA II-III symptoms. Has felt better recently.  Continue Entresto 24/26 mg BID. He was only able to start this medication at full dose several days ago.  Continue coreg 25 mg BID for now.  Continue Farxiga  May consider spironolactone, but I suspect his  BP may not tolerate this.   3. Paroxysmal atrial fibrillation AF burden ~20%.  Will perform full device check at next visit.  He has pending appointment with Dr. Rayann Heman next month to discuss his candidacy for ablation.  LA diam 4.5 cm  Current medicines are reviewed at length with the patient today.   The patient does not have concerns regarding his medicines.  The following changes were made today:  None   Labs/ tests ordered today include:  Orders Placed This Encounter  Procedures   EKG 12-Lead  Recent labs stable 02/17/21  Disposition:   Follow up Dr. Rayann Heman as scheduled in August to  discuss Atrial fibrillation management.    Jacalyn Lefevre, PA-C  03/15/2021 12:53 PM  [place] [address] [phone] (office) [phone] (fax)

## 2021-03-15 NOTE — Patient Instructions (Signed)
Medication Instructions:  Your physician recommends that you continue on your current medications as directed. Please refer to the Current Medication list given to you today.  *If you need a refill on your cardiac medications before your next appointment, please call your pharmacy*   Lab Work: None If you have labs (blood work) drawn today and your tests are completely normal, you will receive your results only by: [facility] (if you have MyChart) OR A paper copy in the mail If you have any lab test that is abnormal or we need to change your treatment, we will call you to review the results.    Follow-Up: At [facility], you and your health needs are our priority.  As part of our continuing mission to provide you with exceptional heart care, we have created designated Provider Care Teams.  These Care Teams include your primary Cardiologist (physician) and Advanced Practice Providers (APPs -  Physician Assistants and Nurse Practitioners) who all work together to provide you with the care you need, when you need it.

## 2021-03-16 DIAGNOSIS — M25611 Stiffness of right shoulder, not elsewhere classified: Secondary | ICD-10-CM | POA: Diagnosis not present

## 2021-03-18 ENCOUNTER — Ambulatory Visit: Payer: Medicare Other | Admitting: Gastroenterology

## 2021-03-20 ENCOUNTER — Telehealth: Payer: Self-pay | Admitting: Internal Medicine

## 2021-03-20 NOTE — Telephone Encounter (Signed)
GI ON-CALL NOTE  The patient called late last evening with increased frequency of bowel movements which were loosening.  He reports a history of C. difficile.  He is concerned that he may be experiencing a relapse.  He was wondering if he should be retreated.  As such, I called in vancomycin 125 mg 4 times daily x10 days.  1 refill.  Called to [facility] ([place]) this morning.  I will forward this to his primary GI provider, Dr. Ardis Hughs for his information and further management and follow-up.  The patient was grateful.

## 2021-03-21 NOTE — Telephone Encounter (Signed)
Spoke with patient, he states that he spoke with Dr. Henrene Pastor yesterday who sent in a 10 day course of Vancomycin. See telephone encounter. Thanks

## 2021-03-21 NOTE — Telephone Encounter (Signed)
The pt has been advised and will call back in 1 week with an update

## 2021-03-27 ENCOUNTER — Other Ambulatory Visit: Payer: Self-pay | Admitting: Internal Medicine

## 2021-04-08 ENCOUNTER — Other Ambulatory Visit: Payer: Self-pay

## 2021-04-08 ENCOUNTER — Telehealth: Payer: Self-pay | Admitting: *Deleted

## 2021-04-08 NOTE — Telephone Encounter (Signed)
Dr Ardis Hughs, patient has decided to push appt out another 2 weeks until end of August.  If he is symptom -free he just wanted to make sure you are fine with proceeding with colonoscopy. Thanks!

## 2021-04-13 ENCOUNTER — Telehealth: Payer: Self-pay | Admitting: Internal Medicine

## 2021-04-13 NOTE — Telephone Encounter (Signed)
Pt has appt 04/20/21 with Dr. Rayann Heman. Have added pre op clearance needed to Appt notes. I will send notes to MD for upcoming appt, as well our device clinic for device clearance. Will send FYI to surgeon's office pt has appt 04/20/21.

## 2021-04-13 NOTE — Telephone Encounter (Signed)
Patient has upcoming visit with Dr. Rayann Heman on 8/10. Will defer to MD for clearance.

## 2021-04-13 NOTE — Telephone Encounter (Signed)
   [facility] Pre-operative Risk Assessment    Patient Name: [name]  DOB: 08-18-1941 MRN: [id number]  HEARTCARE STAFF:  - IMPORTANT!!!!!! Under Visit Info/Reason for Call, type in Other and utilize the format Clearance MM/DD/YY or Clearance TBD. Do not use dashes or single digits. - Please review there is not already an duplicate clearance open for this procedure. - If request is for dental extraction, please clarify the # of teeth to be extracted. - If the patient is currently at the dentist's office, call Pre-Op Callback Staff (MA/nurse) to input urgent request.  - If the patient is not currently in the dentist office, please route to the Pre-Op pool.  Request for surgical clearance:  What type of surgery is being performed? Right shoulder scop rotator cuff repair  When is this surgery scheduled? 05/19/21  What type of clearance is required (medical clearance vs. Pharmacy clearance to hold med vs. Both)? Both  Are there any medications that need to be held prior to surgery and how long? Elquis  Practice name and name of physician performing surgery? [facility] Dr. Melrose Nakayama  What is the office phone number? [phone]   7.   What is the office fax number? [phone]  8.   Anesthesia type (None, local, MAC, general) ? General    Shon Millet 04/13/2021, 9:48 AM  _________________________________________________________________   (provider comments below)

## 2021-04-13 NOTE — Telephone Encounter (Signed)
Clinical pharmacist to review Eliquis.  Separate clearance will need to be sent to device clinic given the presence of Biotronik pacemaker.

## 2021-04-14 NOTE — Telephone Encounter (Signed)
Patient with diagnosis of afib on Eliquis for anticoagulation.    Procedure: Right shoulder scop rotator cuff repair Date of procedure: 05/19/21  CHA2DS2-VASc Score = 4  This indicates a 4.8% annual risk of stroke. The patient's score is based upon: CHF History: Yes HTN History: No Diabetes History: No Stroke History: No Vascular Disease History: Yes Age Score: 2 Gender Score: 0      Of note, patient does have a hx of blood clot in 2003.  CrCl 60.2 ml/min  Per office protocol, patient can hold Eliquis for 2 days prior to procedure.

## 2021-04-15 ENCOUNTER — Telehealth: Payer: Self-pay

## 2021-04-15 ENCOUNTER — Encounter: Payer: Self-pay | Admitting: Internal Medicine

## 2021-04-15 NOTE — Telephone Encounter (Signed)
Ok done to Research officer, trade union

## 2021-04-15 NOTE — Progress Notes (Signed)
PERIOPERATIVE PRESCRIPTION FOR IMPLANTED CARDIAC DEVICE PROGRAMMING  Patient Information: Name:  [name]  DOB:  Jan 11, 1941  MRN:  [id number]      Request for surgical clearance:   What type of surgery is being performed? Right shoulder scop rotator cuff repair   When is this surgery scheduled? 05/19/21   What type of clearance is required (medical clearance vs. Pharmacy clearance to hold med vs. Both)? Both   Are there any medications that need to be held prior to surgery and how long? Elquis   Practice name and name of physician performing surgery? [facility] Dr. Melrose Nakayama   What is the office phone number? [phone]   7.   What is the office fax number? [phone]   8.   Anesthesia type (None, local, MAC, general) ? General      Shon Millet 04/13/2021, 9:48 AM  Device Information:  Clinic EP Physician:  Virl Axe, MD   Device Type:  Pacemaker Manufacturer and Phone #:  Biotronik: [phone] Pacemaker Dependent?:  Yes.   Date of Last Device Check:  03/15/21 Normal Device Function?:  Yes.    Electrophysiologist's Recommendations:  Have magnet available. Provide continuous ECG monitoring when magnet is used or reprogramming is to be performed.  Procedure may interfere with device function.  Magnet should be placed over device during procedure. If magnet cannot remain safely throughout procedure please contact industry for reprogramming to asynchronous pacing.    Per Device Clinic Standing Orders, York Ram, RN  8:35 AM 04/15/2021

## 2021-04-18 NOTE — Telephone Encounter (Signed)
Yes, please let him know that if he is symptom-free from his diverticulitis then colonoscopy should be safe.

## 2021-04-18 NOTE — Telephone Encounter (Signed)
Fax confirmed to [employer]

## 2021-04-19 MED ORDER — VANCOMYCIN HCL 125 MG PO CAPS: ORAL_CAPSULE | ORAL | 0 refills | Status: AC

## 2021-04-20 ENCOUNTER — Encounter: Payer: Medicare Other | Admitting: Internal Medicine

## 2021-04-21 ENCOUNTER — Ambulatory Visit: Payer: Medicare Other | Admitting: Physician Assistant

## 2021-04-21 NOTE — Telephone Encounter (Signed)
That sounds great except I dont have an "original form" to fill out  No request for medical clearance or AVS were mentioned in the original note, nor the "original form"  Ok to fax last AVS

## 2021-04-21 NOTE — Telephone Encounter (Signed)
[name] from [name] called in regarding medical clearance for patient  Says they received faxed forms from Dr. Jenny Reichmann but they were not filled out & also sent in notes from [place] office which they did not need  [name] is requesting Dr. Jenny Reichmann to fill out original form & fax it over as well as AVS notes from patient previous visit w/ Dr. Lita Mains # if needed [phone]

## 2021-04-25 ENCOUNTER — Encounter: Payer: Medicare Other | Admitting: Gastroenterology

## 2021-04-26 NOTE — Telephone Encounter (Signed)
AVS has been faxed to [name]

## 2021-04-28 ENCOUNTER — Telehealth: Payer: Self-pay | Admitting: Internal Medicine

## 2021-04-28 NOTE — Telephone Encounter (Signed)
Left voicemail on rebecca line informing a signature can not be obtained until the return of Dr. Jenny Reichmann.

## 2021-04-28 NOTE — Telephone Encounter (Signed)
[name] from [facility] called in on behalf of patient  Says we have faxed over the surgical clearance twice but both times it has been blank & have had cardiology notes attached  [name] says they need the surgical clearance to be completed in full by Dr. Jenny Reichmann w/ his notes attached.. they do not need the cardiology notes bc they already have those  Fax # [phone]

## 2021-05-02 ENCOUNTER — Encounter: Payer: Self-pay | Admitting: *Deleted

## 2021-05-02 ENCOUNTER — Other Ambulatory Visit: Payer: Self-pay

## 2021-05-02 ENCOUNTER — Ambulatory Visit (INDEPENDENT_AMBULATORY_CARE_PROVIDER_SITE_OTHER): Payer: Medicare Other | Admitting: Internal Medicine

## 2021-05-02 VITALS — BP 110/66 | HR 71 | Ht 75.0 in | Wt 200.4 lb

## 2021-05-02 DIAGNOSIS — I480 Paroxysmal atrial fibrillation: Secondary | ICD-10-CM

## 2021-05-02 NOTE — Progress Notes (Signed)
PCP: Biagio Borg, MD Primary Cardiologist: Dr Marisue Ivan Primary EP:  Dr Leonides Sake is a 80 y.o. male who presents today for further discussions regarding afib.  He has had afib for several years.  He has failed medical therapy with tikosyn and ranexa.  + palpitations and fatigue.  He recently fell.  He has shoulder surgery planned.   Today, he denies symptoms of palpitations, chest pain, shortness of breath,  lower extremity edema, dizziness, presyncope, or syncope.  The patient is otherwise without complaint today.   Past Medical History:  Diagnosis Date   Allergic rhinitis    takes Allegra daily   Allergic rhinitis, cause unspecified 06/22/2012   Arthritis    Atrial fibrillation ([place])    takes Tikosyn and Eliquis daily   Bilateral popliteal artery aneurysm (HCC)    Coronary artery disease    LAD stenting 2004 non-DES   Depression    took Zoloft 17yr ago but nothing now   Diverticulosis    DVT (deep venous thrombosis) (HCC)    Dysphagia    occasionally   History of blood clots 2003   left leg prior to fem pop   History of colon polyps    Hyperlipidemia    takes Tricor and Lipitor daily   Insomnia    takes Ambien nightly as needed   Joint pain    Joint swelling    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   OSA (obstructive sleep apnea)    CPAP   Pacemaker    Peripheral vascular disease ([place]    Pleurisy    early 80's   Prostatitis    Urinary urgency    Past Surgical History:  Procedure Laterality Date   CARDIAC CATHETERIZATION  2004   CARDIAC CATHETERIZATION N/A 05/06/2015   Procedure: Left Heart Cath and Coronary Angiography;  Surgeon: HBelva Crome MD;  [facility];  Service: Cardiovascular;  Laterality: N/A;   CARDIOVERSION N/A 12/18/2020   Procedure: CARDIOVERSION;  Surgeon: NJosue Hector MD;  [facility]  Service: Cardiovascular;  Laterality: N/A;   COLONOSCOPY     hand and arm surgery Right    as a  teenager   HAND SURGERY Left    INGUINAL HERNIA REPAIR Right    IR RADIOLOGIST EVAL & MGMT  01/05/2021   JOINT REPLACEMENT Left December 09, 2013   Left Hip    JOINT REPLACEMENT Right Aug. 25, 2015   Right Hip   left knee surgery     PACEMAKER INSERTION     due to bradycardia   PERMANENT PACEMAKER GENERATOR CHANGE N/A 06/17/2012   Procedure: PERMANENT PACEMAKER GENERATOR CHANGE;  Surgeon: SDeboraha Sprang MD;  [facility];  Service: Cardiovascular;  Laterality: N/A;   right and left fem-pop bypass     stent (unspec)     x2   TOTAL HIP ARTHROPLASTY Left 12/09/2013   Procedure: TOTAL HIP ARTHROPLASTY ANTERIOR APPROACH;  Surgeon: PHessie Dibble MD;  [facility]  Service: Orthopedics;  Laterality: Left;  left anterior total hip arthroplasty   TOTAL HIP ARTHROPLASTY Right 05/05/2014   Procedure: TOTAL HIP ARTHROPLASTY ANTERIOR APPROACH;  Surgeon: PHessie Dibble MD;  [facility]  Service: Orthopedics;  Laterality: Right;   TURBINATE REDUCTION     wisdom extracted       ROS- all systems are reviewed and negative except as per HPI above  Current Outpatient  Medications  Medication Sig Dispense Refill   atorvastatin (LIPITOR) 80 MG tablet TAKE 1 TABLET BY MOUTH  DAILY 90 tablet 2   CALCIUM PO Take 600 mg by mouth 2 (two) times daily.     carvedilol (COREG) 25 MG tablet TAKE 2 TABLETS BY MOUTH  TWICE DAILY 360 tablet 2   Cholecalciferol (D3 VITAMIN PO) Take 4,000 Units by mouth daily.     Coenzyme Q10 (COQ-10 PO) Take 300 mg by mouth daily.     Cyanocobalamin (VITAMIN B 12) 500 MCG TABS Take 500 mcg by mouth 3 (three) times a week.     dapagliflozin propanediol (FARXIGA) 10 MG TABS tablet Take 1 tablet (10 mg total) by mouth daily before breakfast. 90 tablet 1   dofetilide (TIKOSYN) 500 MCG capsule TAKE 1 CAPSULE BY MOUTH  TWICE DAILY (Patient taking differently: TAKE 1 CAPSULE BY MOUTH  TWICE DAILY) 180 capsule 2   ELIQUIS 5 MG TABS tablet TAKE 1 TABLET BY MOUTH  TWICE  DAILY 180 tablet 2   fenofibrate 160 MG tablet TAKE 1 TABLET BY MOUTH  DAILY 90 tablet 2   fexofenadine (ALLEGRA) 180 MG tablet Take 180 mg by mouth daily.     Fluocinonide Emulsified Base 0.05 % CREA APPLY TO AFFECTED AREA(S)  TWO TIMES DAILY AS NEEDED 60 g 2   folic acid (FOLVITE) A999333 MCG tablet Take 400 mcg by mouth daily.     Lysine 500 MG TABS Take 500 mg by mouth 3 (three) times daily.     Magnesium 250 MG TABS Take 250 mg by mouth 2 (two) times daily.      potassium chloride (MICRO-K) 10 MEQ CR capsule Take 10 mEq by mouth 2 (two) times daily.     ranolazine (RANEXA) 500 MG 12 hr tablet TAKE 1 TABLET BY MOUTH  TWICE DAILY 180 tablet 3   sacubitril-valsartan (ENTRESTO) 24-26 MG Take 1 tablet by mouth 2 (two) times daily. 180 tablet 3   vancomycin (VANCOCIN) 125 MG capsule Take 1 capsule (125 mg total) by mouth every 6 (six) hours for 14 days, THEN 1 capsule (125 mg total) every 12 (twelve) hours for 7 days, THEN 1 capsule (125 mg total) daily for 7 days, THEN 1 capsule (125 mg total) every other day for 14 days. 84 capsule 0   No current facility-administered medications for this visit.    Physical Exam: Vitals:   05/02/21 1558  BP: 110/66  Pulse: 71  SpO2: 92%  Weight: 200 lb 6.4 oz (90.9 kg)  Height: '6\' 3"'$  (1.905 m)    GEN- The patient is well appearing, alert and oriented x 3 today.   Head- normocephalic, atraumatic Eyes-  Sclera clear, conjunctiva pink Ears- hearing intact Oropharynx- clear Lungs- Clear to ausculation bilaterally, normal work of breathing Chest- pacemaker pocket is well healed Heart- Regular rate and rhythm, no murmurs, rubs or gallops, PMI not laterally displaced GI- soft, NT, ND, + BS Extremities- no clubbing, cyanosis, or edema  Pacemaker interrogation- reviewed in detail today,  See PACEART report  ekg tracing ordered today is personally reviewed and shows a paced, first degree AV block, IVCD  Assessment and Plan:  1. Symptomatic sinus  bradycardia  Normal pacemaker function See Pace Art report No changes today he is not device dependant today  2. Chronic systolic dysfunction EF [id number] Continue medical therapy He has recently not been taking his SGLT2 inhibitor and entresto due to GI symptoms.  3. Paroxysmal atrial fibrillation The patient has symptomatic,  recurrent  atrial fibrillation. he has failed medical therapy with tikosyn. Chads2vasc score is 4.  he is anticoagulated with eliquis . Therapeutic strategies for afib including medicine and ablation were discussed in detail with the patient today. Risk, benefits, and alternatives to EP study and radiofrequency ablation for afib were also discussed in detail today. These risks include but are not limited to stroke, bleeding, vascular damage, tamponade, perforation, damage to the esophagus, lungs, and other structures, pulmonary vein stenosis, worsening renal function, pacemaker lead dislodgement, and death. The patient understands these risk and wishes to proceed.  We will therefore proceed with catheter ablation at the next available time.  Carto, ICE, anesthesia are requested for the procedure.  Will also obtain cardiac CT prior to the procedure to exclude LAA thrombus and further evaluate atrial anatomy.  4. OSA Compliance with CPAP is advised  Risks, benefits and potential toxicities for medications prescribed and/or refilled reviewed with patient today.   Thompson Grayer MD, [facility] 05/02/2021 4:21 PM

## 2021-05-02 NOTE — Patient Instructions (Addendum)
Medication Instructions:  Your physician recommends that you continue on your current medications as directed. Please refer to the Current Medication list given to you today.  Labwork: None ordered.  Testing/Procedures: Your physician has requested that you have cardiac CT. Cardiac computed tomography (CT) is a painless test that uses an x-ray machine to take clear, detailed pictures of your heart. For further information please visit [email or web address]. Please follow instruction sheet as given.   Your physician has recommended that you have an ablation. Catheter ablation is a medical procedure used to treat some cardiac arrhythmias (irregular heartbeats). During catheter ablation, a long, thin, flexible tube is put into a blood vessel in your groin (upper thigh), or neck. This tube is called an ablation catheter. It is then guided to your heart through the blood vessel. Radio frequency waves destroy small areas of heart tissue where abnormal heartbeats may cause an arrhythmia to start. Please see the instruction sheet given to you today.   Any Other Special Instructions Will Be Listed Below (If Applicable).  If you need a refill on your cardiac medications before your next appointment, please call your pharmacy.   Cardiac Ablation Cardiac ablation is a procedure to destroy (ablate) some heart tissue that is sending bad signals. These bad signals causeproblems in heart rhythm. The heart has many areas that make these signals. If there are problems in these areas, they can make the heart beat in a way that is not normal.Destroying some tissues can help make the heart rhythm normal. Tell your doctor about: Any allergies you have. All medicines you are taking. These include vitamins, herbs, eye drops, creams, and over-the-counter medicines. Any problems you or family members have had with medicines that make you fall asleep (anesthetics). Any blood disorders you have. Any surgeries you have  had. Any medical conditions you have, such as kidney failure. Whether you are pregnant or may be pregnant. What are the risks? This is a safe procedure. But problems may occur, including: Infection. Bruising and bleeding. Bleeding into the chest. Stroke or blood clots. Damage to nearby areas of your body. Allergies to medicines or dyes. The need for a pacemaker if the normal system is damaged. Failure of the procedure to treat the problem. What happens before the procedure? Medicines Ask your doctor about: Changing or stopping your normal medicines. This is important. Taking aspirin and ibuprofen. Do not take these medicines unless your doctor tells you to take them. Taking other medicines, vitamins, herbs, and supplements. General instructions Follow instructions from your doctor about what you cannot eat or drink. Plan to have someone take you home from the hospital or clinic. If you will be going home right after the procedure, plan to have someone with you for 24 hours. Ask your doctor what steps will be taken to prevent infection. What happens during the procedure?  An IV tube will be put into one of your veins. You will be given a medicine to help you relax. The skin on your neck or groin will be numbed. A cut (incision) will be made in your neck or groin. A needle will be put through your cut and into a large vein. A tube (catheter) will be put into the needle. The tube will be moved to your heart. Dye may be put through the tube. This helps your doctor see your heart. Small devices (electrodes) on the tube will send out signals. A type of energy will be used to destroy some heart tissue. The tube will  be taken out. Pressure will be held on your cut. This helps stop bleeding. A bandage will be put over your cut. The exact procedure may vary among doctors and hospitals. What happens after the procedure? You will be watched until you leave the hospital or clinic. This  includes checking your heart rate, breathing rate, oxygen, and blood pressure. Your cut will be watched for bleeding. You will need to lie still for a few hours. Do not drive for 24 hours or as long as your doctor tells you. Summary Cardiac ablation is a procedure to destroy some heart tissue. This is done to treat heart rhythm problems. Tell your doctor about any medical conditions you may have. Tell him or her about all medicines you are taking to treat them. This is a safe procedure. But problems may occur. These include infection, bruising, bleeding, and damage to nearby areas of your body. Follow what your doctor tells you about food and drink. You may also be told to change or stop some of your medicines. After the procedure, do not drive for 24 hours or as long as your doctor tells you. This information is not intended to replace advice given to you by your health care provider. Make sure you discuss any questions you have with your healthcare provider. Document Revised: 07/31/2019 Document Reviewed: 07/31/2019 Elsevier Patient Education  2022 [employer].

## 2021-05-03 NOTE — Telephone Encounter (Signed)
Last OV 06/25/20 Pt advised that appt will be needed for preop paperwork to be completed. Appt scheduled for 05/10/21.  Paperwork on [employer].

## 2021-05-06 ENCOUNTER — Encounter: Payer: Medicare Other | Admitting: Gastroenterology

## 2021-05-09 ENCOUNTER — Other Ambulatory Visit: Payer: Self-pay

## 2021-05-10 ENCOUNTER — Other Ambulatory Visit: Payer: Self-pay

## 2021-05-10 ENCOUNTER — Encounter: Payer: Self-pay | Admitting: Internal Medicine

## 2021-05-10 ENCOUNTER — Ambulatory Visit (INDEPENDENT_AMBULATORY_CARE_PROVIDER_SITE_OTHER): Payer: Medicare Other | Admitting: Internal Medicine

## 2021-05-10 VITALS — BP 118/60 | HR 80 | Temp 97.9°F | Ht 75.0 in | Wt 203.0 lb

## 2021-05-10 DIAGNOSIS — E7800 Pure hypercholesterolemia, unspecified: Secondary | ICD-10-CM

## 2021-05-10 DIAGNOSIS — Z0000 Encounter for general adult medical examination without abnormal findings: Secondary | ICD-10-CM | POA: Diagnosis not present

## 2021-05-10 DIAGNOSIS — Z23 Encounter for immunization: Secondary | ICD-10-CM | POA: Diagnosis not present

## 2021-05-10 DIAGNOSIS — R739 Hyperglycemia, unspecified: Secondary | ICD-10-CM

## 2021-05-10 DIAGNOSIS — Z0001 Encounter for general adult medical examination with abnormal findings: Secondary | ICD-10-CM

## 2021-05-10 DIAGNOSIS — N1831 Chronic kidney disease, stage 3a: Secondary | ICD-10-CM | POA: Diagnosis not present

## 2021-05-10 DIAGNOSIS — E559 Vitamin D deficiency, unspecified: Secondary | ICD-10-CM | POA: Diagnosis not present

## 2021-05-10 DIAGNOSIS — Z7901 Long term (current) use of anticoagulants: Secondary | ICD-10-CM | POA: Diagnosis not present

## 2021-05-10 DIAGNOSIS — Z01818 Encounter for other preprocedural examination: Secondary | ICD-10-CM

## 2021-05-10 DIAGNOSIS — E538 Deficiency of other specified B group vitamins: Secondary | ICD-10-CM | POA: Diagnosis not present

## 2021-05-10 LAB — LIPID PANEL
Cholesterol: 104 mg/dL (ref 0–200)
HDL: 38.1 mg/dL — ABNORMAL LOW (ref >39.00)
LDL Cholesterol: 56 mg/dL (ref 0–99)
NonHDL: 66.11
Total CHOL/HDL Ratio: 3
Triglycerides: 52 mg/dL (ref 0.0–149.0)
VLDL: 10.4 mg/dL (ref 0.0–40.0)

## 2021-05-10 LAB — CBC WITH DIFFERENTIAL/PLATELET
Basophils Absolute: 0 10*3/uL (ref 0.0–0.1)
Basophils Relative: 0.9 % (ref 0.0–3.0)
Eosinophils Absolute: 0.2 10*3/uL (ref 0.0–0.7)
Eosinophils Relative: 5.4 % — ABNORMAL HIGH (ref 0.0–5.0)
HCT: 41.3 % (ref 39.0–52.0)
Hemoglobin: 13.7 g/dL (ref 13.0–17.0)
Lymphocytes Relative: 21.2 % (ref 12.0–46.0)
Lymphs Abs: 0.9 10*3/uL (ref 0.7–4.0)
MCHC: 33.2 g/dL (ref 30.0–36.0)
MCV: 99.6 fl (ref 78.0–100.0)
Monocytes Absolute: 0.4 10*3/uL (ref 0.1–1.0)
Monocytes Relative: 10.4 % (ref 3.0–12.0)
Neutro Abs: 2.7 10*3/uL (ref 1.4–7.7)
Neutrophils Relative %: 62.1 % (ref 43.0–77.0)
Platelets: 129 10*3/uL — ABNORMAL LOW (ref 150.0–400.0)
RBC: 4.15 Mil/uL — ABNORMAL LOW (ref 4.22–5.81)
RDW: 14.9 % (ref 11.5–15.5)
WBC: 4.3 10*3/uL (ref 4.0–10.5)

## 2021-05-10 LAB — HEPATIC FUNCTION PANEL
ALT: 20 U/L (ref 0–53)
AST: 25 U/L (ref 0–37)
Albumin: 4.1 g/dL (ref 3.5–5.2)
Alkaline Phosphatase: 46 U/L (ref 39–117)
Bilirubin, Direct: 0.2 mg/dL (ref 0.0–0.3)
Total Bilirubin: 0.7 mg/dL (ref 0.2–1.2)
Total Protein: 6.6 g/dL (ref 6.0–8.3)

## 2021-05-10 LAB — HEMOGLOBIN A1C: Hgb A1c MFr Bld: 5.5 % (ref 4.6–6.5)

## 2021-05-10 LAB — BASIC METABOLIC PANEL
BUN: 19 mg/dL (ref 6–23)
CO2: 28 mEq/L (ref 19–32)
Calcium: 9.5 mg/dL (ref 8.4–10.5)
Chloride: 107 mEq/L (ref 96–112)
Creatinine, Ser: 1.13 mg/dL (ref 0.40–1.50)
GFR: 60.9 mL/min (ref >60.00)
Glucose, Bld: 108 mg/dL — ABNORMAL HIGH (ref 70–99)
Potassium: 4.1 mEq/L (ref 3.5–5.1)
Sodium: 141 mEq/L (ref 135–145)

## 2021-05-10 LAB — VITAMIN D 25 HYDROXY (VIT D DEFICIENCY, FRACTURES): VITD: 33.65 ng/mL (ref 30.00–100.00)

## 2021-05-10 LAB — VITAMIN B12: Vitamin B-12: 442 pg/mL (ref 211–911)

## 2021-05-10 LAB — TSH: TSH: 1.89 u[IU]/mL (ref 0.35–5.50)

## 2021-05-10 LAB — PSA: PSA: 0.37 ng/mL (ref 0.10–4.00)

## 2021-05-10 NOTE — Progress Notes (Signed)
Patient ID: [name], male   DOB: 1941/08/26, 80 y.o.   MRN: [id number]         Chief Complaint:: wellness exam and right shoulder pain preop, hyperglycemia       HPI:  ALCUS SCIARRETTA is a 80 y.o. male here for wellness exam; due for flu shot, declines colonoscopy, ow up to date with preventive referrals and immunizations                        Also for right shoulder rot cuff surgury soon with several months of pain and reduced ROM; Also eventually plan for afib for ablation soon.  Pt is S/p recent c diff tx with oral vanc; has f/u appt GI Dr Ardis Hughs oct 2022.  Denies worsening reflux, abd pain, dysphagia, n/v, bowel change or blood.  Pt denies chest pain, increased sob or doe, wheezing, orthopnea, PND, increased LE swelling, palpitations, dizziness or syncope.  Pt denies polydipsia, polyuria, new focal neuro s/s.   Pt denies fever, wt loss, night sweats, loss of appetite, or other constitutional symptoms   Wt Readings from Last 3 Encounters:  05/10/21 203 lb (92.1 kg)  05/02/21 200 lb 6.4 oz (90.9 kg)  03/15/21 201 lb (91.2 kg)   BP Readings from Last 3 Encounters:  05/10/21 118/60  05/02/21 110/66  03/15/21 100/76   Immunization History  Administered Date(s) Administered   Fluad Quad(high Dose 65+) 06/25/2020, 05/10/2021   H1N1 08/11/2008   Influenza Split 06/21/2012   Influenza Whole 08/20/2008, 05/31/2010, 05/04/2013   Influenza, High Dose Seasonal PF 06/15/2014, 06/29/2018, 06/15/2019   Influenza-Unspecified 05/18/2015   PFIZER(Purple Top)SARS-COV-2 Vaccination 09/21/2019, 10/11/2019, 06/08/2020, 01/05/2021   Pneumococcal Conjugate-13 04/07/2015   Pneumococcal Polysaccharide-23 09/11/2005   Td 02/02/2009   Tdap 06/25/2020   Zoster Recombinat (Shingrix) 02/02/2017, 06/01/2017   Zoster, Live 03/05/2007   There are no preventive care reminders to display for this patient.     Past Medical History:  Diagnosis Date   Allergic rhinitis    takes Allegra daily   Allergic  rhinitis, cause unspecified 06/22/2012   Arthritis    Atrial fibrillation ([place])    takes Tikosyn and Eliquis daily   Bilateral popliteal artery aneurysm (HCC)    Coronary artery disease    LAD stenting 2004 non-DES   Depression    took Zoloft 90yr ago but nothing now   Diverticulosis    DVT (deep venous thrombosis) (HCC)    Dysphagia    occasionally   History of blood clots 2003   left leg prior to fem pop   History of colon polyps    Hyperlipidemia    takes Tricor and Lipitor daily   Insomnia    takes Ambien nightly as needed   Joint pain    Joint swelling    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   OSA (obstructive sleep apnea)    CPAP   Pacemaker    Peripheral vascular disease ([facility]    Pleurisy    early 80's   Prostatitis    Urinary urgency    Past Surgical History:  Procedure Laterality Date   CARDIAC CATHETERIZATION  2004   CARDIAC CATHETERIZATION N/A 05/06/2015   Procedure: Left Heart Cath and Coronary Angiography;  Surgeon: HBelva Crome MD;  [facility];  Service: Cardiovascular;  Laterality: N/A;   CARDIOVERSION N/A 12/18/2020   Procedure: CARDIOVERSION;  Surgeon: NJosue Hector MD;  [facility];  Service: Cardiovascular;  Laterality: N/A;   COLONOSCOPY     hand and arm surgery Right    as a teenager   HAND SURGERY Left    INGUINAL HERNIA REPAIR Right    IR RADIOLOGIST EVAL & MGMT  01/05/2021   JOINT REPLACEMENT Left December 09, 2013   Left Hip    JOINT REPLACEMENT Right Aug. 25, 2015   Right Hip   left knee surgery     PACEMAKER INSERTION     due to bradycardia   PERMANENT PACEMAKER GENERATOR CHANGE N/A 06/17/2012   Procedure: PERMANENT PACEMAKER GENERATOR CHANGE;  Surgeon: Deboraha Sprang, MD;  [facility];  Service: Cardiovascular;  Laterality: N/A;   right and left fem-pop bypass     stent (unspec)     x2   TOTAL HIP ARTHROPLASTY Left 12/09/2013   Procedure: TOTAL HIP ARTHROPLASTY ANTERIOR APPROACH;   Surgeon: Hessie Dibble, MD;  Location: [place];  Service: Orthopedics;  Laterality: Left;  left anterior total hip arthroplasty   TOTAL HIP ARTHROPLASTY Right 05/05/2014   Procedure: TOTAL HIP ARTHROPLASTY ANTERIOR APPROACH;  Surgeon: Hessie Dibble, MD;  Location: [place];  Service: Orthopedics;  Laterality: Right;   TURBINATE REDUCTION     wisdom extracted       reports that he has never smoked. He has never used smokeless tobacco. He reports that he does not drink alcohol and does not use drugs. family history includes Aneurysm in his father; Diabetes in his mother and another family member; Heart attack in his father; Heart disease in his father and mother; Peripheral vascular disease in his father; Sleep apnea in an other family member; Stroke in his father. Allergies  Allergen Reactions   Amiodarone Other (See Comments)    Peripheral neuropathy resulted   Niacin Other (See Comments)    Caused an irregular heartbeat   Ace Inhibitors Cough   Mometasone Furoate Other (See Comments)    (Nasonex) Causes nasal bleeding and nose bleeds   Current Outpatient Medications on File Prior to Visit  Medication Sig Dispense Refill   atorvastatin (LIPITOR) 80 MG tablet TAKE 1 TABLET BY MOUTH  DAILY 90 tablet 2   CALCIUM PO Take 600 mg by mouth 2 (two) times daily.     carvedilol (COREG) 25 MG tablet TAKE 2 TABLETS BY MOUTH  TWICE DAILY 360 tablet 2   Cholecalciferol (D3 VITAMIN PO) Take 4,000 Units by mouth daily.     Coenzyme Q10 (COQ-10 PO) Take 300 mg by mouth daily.     Cyanocobalamin (VITAMIN B 12) 500 MCG TABS Take 500 mcg by mouth 3 (three) times a week.     dapagliflozin propanediol (FARXIGA) 10 MG TABS tablet Take 1 tablet (10 mg total) by mouth daily before breakfast. 90 tablet 1   dofetilide (TIKOSYN) 500 MCG capsule TAKE 1 CAPSULE BY MOUTH  TWICE DAILY (Patient taking differently: TAKE 1 CAPSULE BY MOUTH  TWICE DAILY) 180 capsule 2   ELIQUIS 5 MG TABS tablet TAKE 1 TABLET BY MOUTH  TWICE  DAILY 180 tablet 2   fenofibrate 160 MG tablet TAKE 1 TABLET BY MOUTH  DAILY 90 tablet 2   fexofenadine (ALLEGRA) 180 MG tablet Take 180 mg by mouth daily.     Fluocinonide Emulsified Base 0.05 % CREA APPLY TO AFFECTED AREA(S)  TWO TIMES DAILY AS NEEDED 60 g 2   folic acid (FOLVITE) A999333 MCG tablet Take 400 mcg by mouth daily.  Lysine 500 MG TABS Take 500 mg by mouth 3 (three) times daily.     Magnesium 250 MG TABS Take 250 mg by mouth 2 (two) times daily.      potassium chloride (MICRO-K) 10 MEQ CR capsule Take 10 mEq by mouth 2 (two) times daily.     ranolazine (RANEXA) 500 MG 12 hr tablet TAKE 1 TABLET BY MOUTH  TWICE DAILY 180 tablet 3   sacubitril-valsartan (ENTRESTO) 24-26 MG Take 1 tablet by mouth 2 (two) times daily. 180 tablet 3   vancomycin (VANCOCIN) 125 MG capsule Take 1 capsule (125 mg total) by mouth every 6 (six) hours for 14 days, THEN 1 capsule (125 mg total) every 12 (twelve) hours for 7 days, THEN 1 capsule (125 mg total) daily for 7 days, THEN 1 capsule (125 mg total) every other day for 14 days. 84 capsule 0   No current facility-administered medications on file prior to visit.        ROS:  All others reviewed and negative.  Objective        PE:  BP 118/60 (BP Location: Right Arm, Patient Position: Sitting, Cuff Size: Normal)   Pulse 80   Temp 97.9 F (36.6 C) (Oral)   Ht '6\' 3"'$  (1.905 m)   Wt 203 lb (92.1 kg)   SpO2 100%   BMI 25.37 kg/m                 Constitutional: Pt appears in NAD               HENT: Head: NCAT.                Right Ear: External ear normal.                 Left Ear: External ear normal.                Eyes: . Pupils are equal, round, and reactive to light. Conjunctivae and EOM are normal               Nose: without d/c or deformity               Neck: Neck supple. Gross normal ROM               Cardiovascular: Normal rate and regular rhythm.                 Pulmonary/Chest: Effort normal and breath sounds without rales or wheezing.                 Abd:  Soft, NT, ND, + BS, no organomegaly               Neurological: Pt is alert. At baseline orientation, motor grossly intact               Skin: Skin is warm. No rashes, no other new lesions, LE edema - none               Psychiatric: Pt behavior is normal without agitation   Micro: none  Cardiac tracings I have personally interpreted today:  none  Pertinent Radiological findings (summarize): none   Lab Results  Component Value Date   WBC 4.3 05/10/2021   HGB 13.7 05/10/2021   HCT 41.3 05/10/2021   PLT 129.0 (L) 05/10/2021   GLUCOSE 108 (H) 05/10/2021   CHOL 104 05/10/2021   TRIG 52.0 05/10/2021   HDL 38.10 (L) 05/10/2021   LDLCALC  56 05/10/2021   ALT 20 05/10/2021   AST 25 05/10/2021   NA 141 05/10/2021   K 4.1 05/10/2021   CL 107 05/10/2021   CREATININE 1.13 05/10/2021   BUN 19 05/10/2021   CO2 28 05/10/2021   TSH 1.89 05/10/2021   PSA 0.37 05/10/2021   INR 1.3 (H) 12/21/2020   HGBA1C 5.5 05/10/2021   Assessment/Plan:  ULYS RELYEA is a 80 y.o. White or Caucasian [1] male with  has a past medical history of Allergic rhinitis, Allergic rhinitis, cause unspecified (06/22/2012), Arthritis, Atrial fibrillation ([place]), Bilateral popliteal artery aneurysm ([place]), Coronary artery disease, Depression, Diverticulosis, DVT (deep venous thrombosis) ([place]), Dysphagia, History of blood clots (2003), History of colon polyps, Hyperlipidemia, Insomnia, Joint pain, Joint swelling, Neuropathy, OSA (obstructive sleep apnea), Pacemaker, Peripheral vascular disease ([place]), Pleurisy, Prostatitis, and Urinary urgency.  Encounter for well adult exam with abnormal findings Age and sex appropriate education and counseling updated with regular exercise and diet Referrals for preventative services - declines colonoscopy Immunizations addressed - for flu shot, ow up to date Smoking counseling  - none needed Evidence for depression or other mood disorder - none significant Most recent  labs reviewed. I have personally reviewed and have noted: 1) the patient's medical and social history 2) The patient's current medications and supplements 3) The patient's height, weight, and BMI have been recorded in the chart   Hyperglycemia Lab Results  Component Value Date   HGBA1C 5.5 05/10/2021   Stable, pt to continue current medical treatment * - farxiga   CKD (chronic kidney disease) stage 3, GFR 30-59 ml/min (HCC) Lab Results  Component Value Date   CREATININE 1.13 05/10/2021   Stable overall, cont to avoid nephrotoxins, cont farxiga  HLD (hyperlipidemia) Lab Results  Component Value Date   LDLCALC 56 05/10/2021   Stable, pt to continue current statin lipitor 80   Preop exam for internal medicine Pt cleared for surgury from medical standpoint  Chronic anticoagulation Pt on eliquis, ok for hold eliquis x 2 days prior to procedure  Followup: No follow-ups on file.  Cathlean Cower, MD 05/14/2021 5:55 PM [facility]

## 2021-05-10 NOTE — Patient Instructions (Addendum)
You had the flu shot today  Ok to cancel the October appt  Please continue all other medications as before, and refills have been done if requested.  Please have the pharmacy call with any other refills you may need.  Please continue your efforts at being more active, low cholesterol diet, and weight control.  You are otherwise up to date with prevention measures today.  Please keep your appointments with your specialists as you may have planned  Please go to the LAB at the blood drawing area for the tests to be done  You will be contacted by phone if any changes need to be made immediately.  Otherwise, you will receive a letter about your results with an explanation, but please check with MyChart first.  Please remember to sign up for MyChart if you have not done so, as this will be important to you in the future with finding out test results, communicating by private email, and scheduling acute appointments online when needed.  Please make an Appointment to return for your 1 year visit, or sooner if needed, with Lab testing by Appointment as well, to be done about 3-5 days before at the [facility] (so this is for TWO appointments - please see the scheduling desk as you leave)  Due to the ongoing Covid 19 pandemic, our lab now requires an appointment for any labs done at our office.  If you need labs done and do not have an appointment, please call our office ahead of time to schedule before presenting to the lab for your testing.

## 2021-05-11 ENCOUNTER — Encounter: Payer: Self-pay | Admitting: Internal Medicine

## 2021-05-11 LAB — URINALYSIS, ROUTINE W REFLEX MICROSCOPIC
Bilirubin Urine: NEGATIVE
Ketones, ur: NEGATIVE
Leukocytes,Ua: NEGATIVE
Nitrite: NEGATIVE
Specific Gravity, Urine: <=1.005 — AB (ref 1.000–1.030)
Total Protein, Urine: NEGATIVE
Urine Glucose: >=1000 — AB
Urobilinogen, UA: 0.2 (ref 0.0–1.0)
WBC, UA: NONE SEEN (ref 0–?)
pH: 6 (ref 5.0–8.0)

## 2021-05-14 DIAGNOSIS — Z7901 Long term (current) use of anticoagulants: Secondary | ICD-10-CM | POA: Insufficient documentation

## 2021-05-14 DIAGNOSIS — Z01818 Encounter for other preprocedural examination: Secondary | ICD-10-CM | POA: Insufficient documentation

## 2021-05-14 NOTE — Assessment & Plan Note (Signed)
Lab Results  Component Value Date   LDLCALC 56 05/10/2021   Stable, pt to continue current statin lipitor 80

## 2021-05-14 NOTE — Assessment & Plan Note (Signed)
Lab Results  Component Value Date   HGBA1C 5.5 05/10/2021   Stable, pt to continue current medical treatment * - farxiga

## 2021-05-14 NOTE — Assessment & Plan Note (Signed)
Age and sex appropriate education and counseling updated with regular exercise and diet Referrals for preventative services - declines colonoscopy Immunizations addressed - for flu shot, ow up to date Smoking counseling  - none needed Evidence for depression or other mood disorder - none significant Most recent labs reviewed. I have personally reviewed and have noted: 1) the patient's medical and social history 2) The patient's current medications and supplements 3) The patient's height, weight, and BMI have been recorded in the chart

## 2021-05-14 NOTE — Assessment & Plan Note (Signed)
Pt on eliquis, ok for hold eliquis x 2 days prior to procedure

## 2021-05-14 NOTE — Assessment & Plan Note (Signed)
Pt cleared for surgury from medical standpoint

## 2021-05-14 NOTE — Assessment & Plan Note (Signed)
Lab Results  Component Value Date   CREATININE 1.13 05/10/2021   Stable overall, cont to avoid nephrotoxins, cont farxiga

## 2021-05-17 ENCOUNTER — Other Ambulatory Visit: Payer: Self-pay | Admitting: Orthopaedic Surgery

## 2021-05-19 NOTE — Progress Notes (Signed)
Sent message, via epic in basket, requesting orders in epic from surgeon.  

## 2021-05-24 NOTE — Patient Instructions (Addendum)
DUE TO COVID-19 ONLY ONE VISITOR IS ALLOWED TO COME WITH YOU AND STAY IN THE WAITING ROOM ONLY DURING PRE OP AND PROCEDURE DAY OF SURGERY IF YOU ARE GOING HOME AFTER SURGERY. IF YOU ARE SPENDING THE NIGHT 2 PEOPLE MAY VISIT WITH YOU IN YOUR PRIVATE ROOM AFTER SURGERY UNTIL VISITING  HOURS ARE OVER AT 800 PM AND THE 2 VISITORS CANNOT SPEND THE NIGHT.                  [name]     Your procedure is scheduled on: 05/31/21   Report to [facility] Main  Entrance   Report to admitting at  2:00 PM     Call this number if you have problems the morning of surgery [phone]   No food after midnight.    You may have clear liquid until 1:00 PM    At 12:30 PM drink pre surgery drink. G2   Nothing by mouth after 1:00 PM   CLEAR LIQUID DIET   Foods Allowed                                                                     Foods Excluded                                                                                           No milk or cream in coffee Coffee and tea, regular and decaf                             liquids that you cannot  Plain Jell-O any favor except red or purple                                           see through such as: Fruit ices (not with fruit pulp)                                     milk, soups, orange juice  Iced Popsicles                                    All solid food Carbonated beverages, regular and diet                                    Cranberry, grape and apple juices Sports drinks like Gatorade Lightly seasoned clear broth or consume(fat free) Sugar    BRUSH YOUR TEETH MORNING OF SURGERY AND RINSE YOUR MOUTH OUT, NO CHEWING GUM CANDY  OR MINTS.     Take these medicines the morning of surgery with A SIP OF WATER:  Ranolazine, Dofetilide, Carvedilol, Entresto, Vancomycin  Stop taking __Eliquis_________on __9/17________as instructed by __Dr. John___________.     How to Manage Your Diabetes Before and After Surgery  Why is it  important to control my blood sugar before and after surgery? Improving blood sugar levels before and after surgery helps healing and can limit problems. A way of improving blood sugar control is eating a healthy diet by:  Eating less sugar and carbohydrates  Increasing activity/exercise  Talking with your doctor about reaching your blood sugar goals High blood sugars (greater than 180 mg/dL) can raise your risk of infections and slow your recovery, so you will need to focus on controlling your diabetes during the weeks before surgery. Make sure that the doctor who takes care of your diabetes knows about your planned surgery including the date and location.  How do I manage my blood sugar before surgery? Check your blood sugar at least 4 times a day, starting 2 days before surgery, to make sure that the level is not too high or low. Check your blood sugar the morning of your surgery when you wake up and every 2 hours until you get to the Short Stay unit. If your blood sugar is less than 70 mg/dL, you will need to treat for low blood sugar: Do not take insulin. Treat a low blood sugar (less than 70 mg/dL) with  cup of clear juice (cranberry or apple), 4 glucose tablets, OR glucose gel. Recheck blood sugar in 15 minutes after treatment (to make sure it is greater than 70 mg/dL). If your blood sugar is not greater than 70 mg/dL on recheck, call [phone] for further instructions. Report your blood sugar to the short stay nurse when you get to Short Stay.  If you are admitted to the hospital after surgery: Your blood sugar will be checked by the staff and you will probably be given insulin after surgery (instead of oral diabetes medicines) to make sure you have good blood sugar levels. The goal for blood sugar control after surgery is 80-180 mg/dL.   WHAT DO I DO ABOUT MY DIABETES MEDICATION?  Do not take oral diabetes medicines (pills) the morning of surgery.                                       You may not have any metal on your body including              piercings  Do not wear jewelry, lotions, powders or  deodorant                      Men may shave face and neck.   Do not bring valuables to the hospital. [place].  Contacts, dentures or bridgework may not be worn into surgery.       Patients discharged the day of surgery will not be allowed to drive home.  IF YOU ARE HAVING SURGERY AND GOING HOME THE SAME DAY, YOU MUST HAVE AN ADULT TO DRIVE YOU HOME AND BE WITH YOU FOR 24 HOURS.  YOU MAY GO HOME BY TAXI OR UBER OR ORTHERWISE, BUT AN ADULT MUST ACCOMPANY YOU HOME AND STAY  WITH YOU FOR 24 HOURS.  Name and phone number of your driver:  Special Instructions: N/A              Please read over the following fact sheets you were given: _____________________________________________________________________  [facility]- Preparing for Total Shoulder Arthroplasty    Before surgery, you can play an important role. Because skin is not sterile, your skin needs to be as free of germs as possible. You can reduce the number of germs on your skin by using the following products. Benzoyl Peroxide Gel Reduces the number of germs present on the skin Applied twice a day to shoulder area starting two days before surgery    ==================================================================  Please follow these instructions carefully:  BENZOYL PEROXIDE 5% GEL  Please do not use if you have an allergy to benzoyl peroxide.   If your skin becomes reddened/irritated stop using the benzoyl peroxide.  Starting two days before surgery, apply as follows: Apply benzoyl peroxide in the morning and at night. Apply after taking a shower. If you are not taking a shower clean entire shoulder front, back, and side along with the armpit with a clean wet washcloth.  Place a quarter-sized dollop on your shoulder and rub in thoroughly, making sure  to cover the front, back, and side of your shoulder, along with the armpit.   2 days before ____ AM   ____ PM              1 day before ____ AM   ____ PM                         Do this twice a day for two days.  (Last application is the night before surgery, AFTER using the CHG soap as described below).  Do NOT apply benzoyl peroxide gel on the day of surgery.            [facility] - Preparing for Surgery Before surgery, you can play an important role.  Because skin is not sterile, your skin needs to be as free of germs as possible.  You can reduce the number of germs on your skin by washing with CHG (chlorahexidine gluconate) soap before surgery.  CHG is an antiseptic cleaner which kills germs and bonds with the skin to continue killing germs even after washing. Please DO NOT use if you have an allergy to CHG or antibacterial soaps.  If your skin becomes reddened/irritated stop using the CHG and inform your nurse when you arrive at Short Stay.   You may shave your face/neck. Please follow these instructions carefully:  1.  Shower with CHG Soap the night before surgery and the  morning of Surgery.  2.  If you choose to wash your hair, wash your hair first as usual with your  normal  shampoo.  3.  After you shampoo, rinse your hair and body thoroughly to remove the  shampoo.                            4.  Use CHG as you would any other liquid soap.  You can apply chg directly  to the skin and wash                       Gently with a scrungie or clean washcloth.  5.  Apply the CHG Soap to your body ONLY FROM THE  NECK DOWN.   Do not use on face/ open                           Wound or open sores. Avoid contact with eyes, ears mouth and genitals (private parts).                       Wash face,  Genitals (private parts) with your normal soap.             6.  Wash thoroughly, paying special attention to the area where your surgery  will be performed.  7.  Thoroughly rinse your body with warm water  from the neck down.  8.  DO NOT shower/wash with your normal soap after using and rinsing off  the CHG Soap.                9.  Pat yourself dry with a clean towel.            10.  Wear clean pajamas.            11.  Place clean sheets on your bed the night of your first shower and do not  sleep with pets. Day of Surgery : Do not apply any lotions/deodorants the morning of surgery.  Please wear clean clothes to the hospital/surgery center.  FAILURE TO FOLLOW THESE INSTRUCTIONS MAY RESULT IN THE CANCELLATION OF YOUR SURGERY PATIENT SIGNATURE_________________________________  NURSE SIGNATURE__________________________________  ________________________________________________________________________

## 2021-05-25 ENCOUNTER — Encounter (HOSPITAL_COMMUNITY)
Admission: RE | Admit: 2021-05-25 | Discharge: 2021-05-25 | Disposition: A | Discharge status: Home / Self Care | Payer: Medicare Other | Source: Ambulatory Visit | Attending: Orthopaedic Surgery | Admitting: Orthopaedic Surgery

## 2021-05-25 ENCOUNTER — Other Ambulatory Visit: Payer: Self-pay

## 2021-05-25 ENCOUNTER — Encounter (HOSPITAL_COMMUNITY): Payer: Self-pay

## 2021-05-25 DIAGNOSIS — M75101 Unspecified rotator cuff tear or rupture of right shoulder, not specified as traumatic: Secondary | ICD-10-CM | POA: Diagnosis not present

## 2021-05-25 DIAGNOSIS — Z01812 Encounter for preprocedural laboratory examination: Secondary | ICD-10-CM | POA: Diagnosis not present

## 2021-05-25 HISTORY — DX: Chronic kidney disease, stage 3 unspecified: N18.30

## 2021-05-25 LAB — GLUCOSE, CAPILLARY: Glucose-Capillary: 149 mg/dL — ABNORMAL HIGH (ref 70–99)

## 2021-05-25 NOTE — Progress Notes (Addendum)
COVID test Completed:NA   PCP - Dr. Marshall Cork LOV 06/10/21 Cardiologist - Dr. Danie Chandler   Chest x-ray - no EKG - 05/02/21-epic Stress Test - 11/02/20-epic ECHO - 11/24/20-epic Cardiac Cath - 2004, 2016 Pacemaker/ICD device last checked: Biotronik 03/15/21  Sleep Study - yes  CPAP - yes  Fasting Blood Sugar - NA Pt takes [place] for his heart Checks Blood Sugar _____ times a day  Blood Thinner Instructions:Eliquis/ Dr. Jens Som Aspirin Instructions:Hold 2 days prior to DOS / Dr. Jens Som, Dr,John Last Dose:05/28/21  Anesthesia review: yes  Patient denies shortness of breath, fever, cough and chest pain at PAT appointment yes Pt can climb 2 flights of stairs, do housework and ADLs without SOB.  Pt had diverticulitis 12/2020. In May he had C-Diff. He is still on Vancomycin every 3 days. Pt has asked to have an ablation on 10 20/22 with Dr. Rayann Heman.   Patient verbalized understanding of instructions that were given to them at the PAT appointment. Patient was also instructed that they will need to review over the PAT instructions again at home before surgery. yes

## 2021-05-26 ENCOUNTER — Encounter (HOSPITAL_COMMUNITY): Payer: Self-pay

## 2021-05-26 NOTE — Progress Notes (Addendum)
Anesthesia Chart Review   Case: [id number] Date/Time: 05/31/21 1602   Procedure: RIGHT SHOULDER ARTHROSCOPY AND ROTATOR CUFF REPAIR (Right)   Anesthesia type: General   Pre-op diagnosis: RIGHT SHOULDER ROTATOR CUFF TEAR   Location: [facility] ROOM 06 / [facility]   Surgeons: Melrose Nakayama, MD       DISCUSSION:80 y.o. never smoker with h/o atrial fibrillation (Eliquis), CAD (stent to LAD 2004, patent on cath 05/06/2015), OSA on cpap, CKD Stage III, pacemaker in place (device orders in 04/15/2021 progress note), right shoulder rotator cuff tear scheduled for above procedure 05/31/2021 with Dr. Melrose Nakayama.   Pt seen by PCP 05/14/2021, per OV note, "Pt cleared for surgery from medical standpoint."  Pt last seen by cardiology 05/02/21. Per OV note ablation planned, . At this time ablation planned for 06/30/2021. Will request cardiac clearance.   Addendum 05/30/2021: Per cardiology preoperative evaluation 05/30/2021, " Chart reviewed as part of pre-operative protocol coverage. Given past medical history and time since last visit, based on ACC/AHA guidelines, Casanova T Varghese would be at acceptable risk for the planned procedure without further cardiovascular testing." VS: BP 108/62   Pulse 89   Temp 36.6 C (Oral)   Resp 20   Ht '6\' 3"'$  (1.905 m)   Wt 89.8 kg   SpO2 100%   BMI 24.75 kg/m   PROVIDERS: Biagio Borg, MD is PCP   Virl Axe, MD is Cardiologist  LABS: Labs reviewed: Acceptable for surgery. (all labs ordered are listed, but only abnormal results are displayed)  Labs Reviewed  GLUCOSE, CAPILLARY - Abnormal; Notable for the following components:      Result Value   Glucose-Capillary 149 (*)    All other components within normal limits     IMAGES:   EKG: 05/02/21 Rate 71 bpm  Atrial paced  CV: Echo 11/24/2020 1. Left ventricular ejection fraction, by estimation, is 40 to 45%. The  left ventricle has mildly decreased function. The left ventricle  demonstrates regional wall  motion abnormalities. Abnormal (paradoxical)  septal motion, consistent with left bundle  branch block. The left ventricular internal cavity size was mildly  dilated. Left ventricular diastolic parameters are consistent with Grade  II diastolic dysfunction (pseudonormalization). Elevated left atrial  pressure.   2. Right ventricular systolic function is normal. The right ventricular  size is moderately enlarged. There is normal pulmonary artery systolic  pressure. The estimated right ventricular systolic pressure is 123456 mmHg.   3. Left atrial size was severely dilated.   4. Right atrial size was mildly dilated.   5. The mitral valve is normal in structure. Mild mitral valve  regurgitation.   6. The aortic valve is tricuspid. Aortic valve regurgitation is not  visualized. Mild to moderate aortic valve sclerosis/calcification is  present, without any evidence of aortic stenosis.   7. The inferior vena cava is normal in size with greater than 50%  respiratory variability, suggesting right atrial pressure of 3 mmHg.   Stress Test 11/02/20 The left ventricular ejection fraction is severely decreased (<30%). Nuclear stress EF: 20%. No T wave inversion was noted during stress. There was no ST segment deviation noted during stress. This is a high risk study.   No reversible ischemia. LVEF 20% with severe global hypokinesis. This is a high risk study due to LVEF.   Cardiac Cath 05/06/2015 Mid RCA to Dist RCA lesion, 65% stenosed. Prox Cx to Mid Cx lesion, 45% stenosed. Prox RCA lesion, 40% stenosed. Ost 1st Diag lesion, 90% stenosed.  Jailed by previously palced stent. Prox LAD to Mid LAD lesion, 5% stenosed. The lesion was previously treated with a stent (unknown type) . Mid LAD to Dist LAD lesion, 60% stenosed. Dist LAD lesion, 25% stenosed.   Diffuse noncritical coronary disease involving each of the 3 major coronaries. Both the mid RCA and mid LAD contained up to 65% stenosis. The  previously placed proximal LAD stent is widely patent. The small first diagonal contains 90% stenosis and is jailed by the stent. Dilated left ventricle with diffuse hypokinesis and an estimated ejection fraction of 35%. Findings are compatible with chronic systolic heart failure.   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[facility])    Coronary artery disease    LAD stenting 2004 non-DES   Depression    took Zoloft 2yr ago but nothing now   Diverticulosis    DVT (deep venous thrombosis) (HCC)    Dysphagia    occasionally   History of blood clots 2003   left leg prior to fem pop   History of colon polyps    Hyperlipidemia    takes Tricor and Lipitor daily   Insomnia    takes Ambien nightly as needed   Joint pain    Joint swelling    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   OSA (obstructive sleep apnea)    CPAP   Pacemaker    Peripheral vascular disease ([place]    Pleurisy    early 80's   Prostatitis    Urinary urgency     Past Surgical History:  Procedure Laterality Date   CARDIAC CATHETERIZATION  09/11/2002   with 2 stents   CARDIAC CATHETERIZATION N/A 05/06/2015   Procedure: Left Heart Cath and Coronary Angiography;  Surgeon: HBelva Crome MD;  [facility];  Service: Cardiovascular;  Laterality: N/A;   CARDIOVERSION N/A 12/18/2020   Procedure: CARDIOVERSION;  Surgeon: NJosue Hector MD;  [place] [facility]  Service: Cardiovascular;  Laterality: N/A;   COLONOSCOPY     hand and arm surgery Right    as a teenager   HAND SURGERY Left    INGUINAL HERNIA REPAIR Right    IR RADIOLOGIST EVAL & MGMT  01/05/2021   JOINT REPLACEMENT Left 12/09/2013   Left Hip    JOINT REPLACEMENT Right 05/05/2014   Right Hip   left knee surgery      PACEMAKER INSERTION  09/25/1996   due to bradycardia   PERMANENT PACEMAKER GENERATOR CHANGE N/A 06/17/2012   Procedure: PERMANENT PACEMAKER GENERATOR CHANGE;  Surgeon: SDeboraha Sprang MD;  [facility];  Service: Cardiovascular;  Laterality: N/A;   right and left fem-pop bypass     TOTAL HIP ARTHROPLASTY Left 12/09/2013   Procedure: TOTAL HIP ARTHROPLASTY ANTERIOR APPROACH;  Surgeon: PHessie Dibble MD;  [place] [facility]  Service: Orthopedics;  Laterality: Left;  left anterior total hip arthroplasty   TOTAL HIP ARTHROPLASTY Right 05/05/2014   Procedure: TOTAL HIP ARTHROPLASTY ANTERIOR APPROACH;  Surgeon: PHessie Dibble MD;  [place] [facility]  Service: Orthopedics;  Laterality: Right;   TURBINATE REDUCTION     wisdom extracted       MEDICATIONS:  atorvastatin (LIPITOR) 80 MG tablet   CALCIUM PO  carvedilol (COREG) 25 MG tablet   Cholecalciferol (D3 VITAMIN PO)   Coenzyme Q10 (COQ-10 PO)   Cyanocobalamin (VITAMIN B 12) 500 MCG TABS   dapagliflozin propanediol (FARXIGA) 10 MG TABS tablet   dofetilide (TIKOSYN) 500 MCG capsule   ELIQUIS 5 MG TABS tablet   fenofibrate 160 MG tablet   fexofenadine (ALLEGRA) 180 MG tablet   Fluocinonide Emulsified Base [id number] % CREA   folic acid (FOLVITE) A999333 MCG tablet   Lysine 500 MG TABS   Magnesium 250 MG TABS   potassium chloride (MICRO-K) 10 MEQ CR capsule   ranolazine (RANEXA) 500 MG 12 hr tablet   sacubitril-valsartan (ENTRESTO) 24-26 MG   vancomycin (VANCOCIN) 125 MG capsule   No current facility-administered medications for this encounter.     Konrad Felix Ward, PA-C [facility] [phone]

## 2021-05-27 NOTE — Telephone Encounter (Signed)
Dr. Rayann Heman -  Pt  doing well when you saw on 8/22. He is scheduled for an ablation 06/30/21 cCT ordered before ablation to exclude LAA.   Can you confirm that acceptable to proceed with R shoulder surgery 9/20?

## 2021-05-27 NOTE — Telephone Encounter (Signed)
Office is calling back saying the anesthesia department needs more clarification. They need to know if he is cleared from cardiologist stand point even though he has testing out after surgery. Patient surgery is 9/20. Please advise.

## 2021-05-30 NOTE — Telephone Encounter (Signed)
   Primary Cardiologist: Evalina Field, MD  Chart reviewed as part of pre-operative protocol coverage. Given past medical history and time since last visit, based on ACC/AHA guidelines, [name] would be at acceptable risk for the planned procedure without further cardiovascular testing.   Patient with diagnosis of afib on Eliquis for anticoagulation.     Procedure: Right shoulder scop rotator cuff repair Date of procedure: 05/31/21   CHA2DS2-VASc Score = 4  This indicates a 4.8% annual risk of stroke. The patient's score is based upon: CHF History: Yes HTN History: No Diabetes History: No Stroke History: No Vascular Disease History: Yes Age Score: 2 Gender Score: 0       Of note, patient does have a hx of blood clot in 2003.   CrCl 60.2 ml/min   Per office protocol, patient can hold Eliquis for 2 days prior to procedure.   I will route this recommendation to the requesting party via Epic fax function and remove from pre-op pool.  Please call with questions.  Jossie Ng. Carolin Quang NP-C    05/30/2021, 9:50 AM [address] Office [phone] Fax [phone]

## 2021-05-30 NOTE — H&P (Signed)
[name] is an 80 y.o. male.   Chief Complaint: Right shoulder pain HPI: [name] is here today for follow-up of his right shoulder.  Unfortunately since last visit he was hospitalized with diverticulitis and then a perforated bowel and then C. difficile.  He is now doing much better and is recovered.  His shoulder continues to bother him.  He has problems reaching his arm up and out.  It wakes him up at night.  He does not think he can go on like this.  He is interested in surgery at this time.    Ultrasound: Dr. Rip Harbour and Dr. Garlan Fillers were kind enough to perform an ultrasound evaluation of the shoulder.  [name] cannot have MRI scans with his implanted heart device.  This clearly shows a supraspinatus rotator cuff tear with some involvement of the infraspinatus.  The subscapularis for the most part is intact and the biceps tendon is in appropriate position.  Past Medical History:  Diagnosis Date   Allergic rhinitis    takes Allegra daily   Allergic rhinitis, cause unspecified 06/22/2012   Arthritis    Atrial fibrillation ([place])    takes Tikosyn and Eliquis daily   Bilateral popliteal artery aneurysm (HCC)    Chronic kidney disease (CKD), stage III (moderate) (HCC)    Coronary artery disease    LAD stenting 2004 non-DES   Depression    took Zoloft 64yr ago but nothing now   Diverticulosis    DVT (deep venous thrombosis) ([facility]    Dysphagia    occasionally   History of blood clots 2003   left leg prior to fem pop   History of colon polyps    Hyperlipidemia    takes Tricor and Lipitor daily   Insomnia    takes Ambien nightly as needed   Joint pain    Joint swelling    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   OSA (obstructive sleep apnea)    CPAP   Pacemaker    Peripheral vascular disease ([place]    Pleurisy    early 80's   Prostatitis    Urinary urgency     Past Surgical History:  Procedure Laterality Date   CARDIAC CATHETERIZATION  09/11/2002   with 2  stents   CARDIAC CATHETERIZATION N/A 05/06/2015   Procedure: Left Heart Cath and Coronary Angiography;  Surgeon: HBelva Crome MD;  [facility];  Service: Cardiovascular;  Laterality: N/A;   CARDIOVERSION N/A 12/18/2020   Procedure: CARDIOVERSION;  Surgeon: NJosue Hector MD;  [facility]  Service: Cardiovascular;  Laterality: N/A;   COLONOSCOPY     hand and arm surgery Right    as a teenager   HAND SURGERY Left    INGUINAL HERNIA REPAIR Right    IR RADIOLOGIST EVAL & MGMT  01/05/2021   JOINT REPLACEMENT Left 12/09/2013   Left Hip    JOINT REPLACEMENT Right 05/05/2014   Right Hip   left knee surgery     PACEMAKER INSERTION  09/25/1996   due to bradycardia   PERMANENT PACEMAKER GENERATOR CHANGE N/A 06/17/2012   Procedure: PERMANENT PACEMAKER GENERATOR CHANGE;  Surgeon: SDeboraha Sprang MD;  [facility];  Service: Cardiovascular;  Laterality: N/A;   right and left fem-pop bypass     TOTAL HIP ARTHROPLASTY Left 12/09/2013   Procedure: TOTAL HIP ARTHROPLASTY ANTERIOR APPROACH;  Surgeon: PHessie Dibble MD;  [facility]  Service: Orthopedics;  Laterality: Left;  left anterior total hip arthroplasty   TOTAL HIP ARTHROPLASTY Right 05/05/2014   Procedure: TOTAL HIP ARTHROPLASTY ANTERIOR APPROACH;  Surgeon: Hessie Dibble, MD;  Location: [facility];  Service: Orthopedics;  Laterality: Right;   TURBINATE REDUCTION     wisdom extracted       Family History  Problem Relation Age of Onset   Diabetes Other        family hx   Sleep apnea Other        family hx   Diabetes Mother    Heart disease Mother        Before age 83   Heart disease Father        Before age 4   Heart attack Father    Stroke Father    Aneurysm Father        bilat pop art aneurysms   Peripheral vascular disease Father        Popliteal Aneurysm   Allergic rhinitis Neg Hx    Angioedema Neg Hx    Asthma Neg Hx    Eczema Neg Hx    Immunodeficiency Neg Hx    Urticaria Neg Hx     Social History:  reports that he has never smoked. He has never used smokeless tobacco. He reports that he does not drink alcohol and does not use drugs.  Allergies:  Allergies  Allergen Reactions   Amiodarone Other (See Comments)    Peripheral neuropathy resulted   Niacin Other (See Comments)    Caused an irregular heartbeat   Ace Inhibitors Cough   Mometasone Furoate Other (See Comments)    (Nasonex) Causes nasal bleeding and nose bleeds    No medications prior to admission.    No results found for this or any previous visit (from the past 48 hour(s)). No results found.  Review of Systems  Musculoskeletal:  Positive for arthralgias.       Right shoulder   There were no vitals taken for this visit. Physical Exam Constitutional:      Appearance: Normal appearance. He is normal weight.  HENT:     Head: Normocephalic and atraumatic.     Nose: Nose normal.     Mouth/Throat:     Pharynx: Oropharynx is clear.  Eyes:     Extraocular Movements: Extraocular movements intact.  Cardiovascular:     Rate and Rhythm: Normal rate and regular rhythm.  Pulmonary:     Effort: Pulmonary effort is normal.  Abdominal:     Palpations: Abdomen is soft.  Musculoskeletal:     Cervical back: Normal range of motion.     Comments: Examination of the right shoulder shows active forward flexion about 100.  Passively he goes full.  He has a painful arc.  External rotation to about 30.  He has significant weakness with resisted external rotation and forward flexion.  Good strength with internal rotation.  Normal sensation motor function throughout his arm.  He is neurovascularly intact distally.  Skin:    General: Skin is warm and dry.  Neurological:     General: No focal deficit present.     Mental Status: He is alert and oriented to person, place, and time.  Psychiatric:        Mood and Affect: Mood normal.        Behavior: Behavior normal.        Thought Content: Thought content normal.         Judgment: Judgment normal.  Assessment/Plan Assessment: Right shoulder acute rotator cuff tear by ultrasound 2022 injected 10/27/20  Plan: After discussing with the patient we informed the only long-term solution is a right shoulder arthroscopy with rotator cuff repair.  I reviewed the risk of anesthesia, infection, DVT, and bleeding related to a right shoulder arthroscopy with rotator cuff repair.  He is in agreement and would like to proceed.  We will get cardiac clearance.  He would likely be in a sling for a month and then would start some therapy after that.  His total recovery would be about 3-4 months.    Larwance Sachs Josearmando Kuhnert, PA-C 05/30/2021, 2:47 PM

## 2021-05-30 NOTE — Anesthesia Preprocedure Evaluation (Addendum)
Anesthesia Evaluation  Patient identified by MRN, date of birth, ID band Patient awake    Reviewed: Allergy & Precautions, NPO status , Patient's Chart, lab work & pertinent test results  History of Anesthesia Complications Negative for: history of anesthetic complications  Airway Mallampati: II  TM Distance: >3 FB Neck ROM: Full    Dental  (+) Caps, Dental Advisory Given   Pulmonary sleep apnea ,    Pulmonary exam normal        Cardiovascular hypertension, Pt. on medications and Pt. on home beta blockers + CAD, + Cardiac Stents and + Peripheral Vascular Disease  + dysrhythmias Atrial Fibrillation + pacemaker  Rhythm:Irregular Rate:Normal   '22 TTE - EF 40 to 45%. Abnormal (paradoxical) septal motion, consistent with left bundle branch block. The left ventricular internal cavity size was mildly dilated. Grade  II diastolic dysfunction (pseudonormalization). The right ventricular size is moderately enlarged. Left atrial size was severely dilated. Right atrial size was mildly dilated. Mild mitral valve regurgitation.   '22 Myoperfusion (prior to above TTE) - The left ventricular ejection fraction is severely decreased (<30%). Nuclear stress EF: 20%. No T wave inversion was noted during stress. There was no ST segment deviation noted during stress. This is a high risk study due to low EF.    Neuro/Psych PSYCHIATRIC DISORDERS Depression negative neurological ROS     GI/Hepatic negative GI ROS, Neg liver ROS,   Endo/Other  negative endocrine ROS  Renal/GU CRFRenal disease     Musculoskeletal  (+) Arthritis ,   Abdominal   Peds  Hematology  On eliquis    Anesthesia Other Findings   Reproductive/Obstetrics                           Anesthesia Physical Anesthesia Plan  ASA: 4  Anesthesia Plan: General   Post-op Pain Management:  Regional for Post-op pain   Induction: Intravenous  PONV  Risk Score and Plan: 2 and Treatment may vary due to age or medical condition, Ondansetron and Dexamethasone  Airway Management Planned: Oral ETT  Additional Equipment: Arterial line  Intra-op Plan:   Post-operative Plan: Extubation in OR  Informed Consent: I have reviewed the patients History and Physical, chart, labs and discussed the procedure including the risks, benefits and alternatives for the proposed anesthesia with the patient or authorized representative who has indicated his/her understanding and acceptance.     Dental advisory given  Plan Discussed with: CRNA, Anesthesiologist and Surgeon  Anesthesia Plan Comments: (Discussed pacemaker with device rep.  When magnet placed, HR increased to 90, but pt developed bigeminy appearing rhythm and perfused only half of the beats with the Pulse ox showing 45.  He put the device in a synchronous mode at a rate of 70 for the procedure which appeared to have 1:1 perfusion.)     Anesthesia Quick Evaluation

## 2021-05-30 NOTE — Telephone Encounter (Signed)
[name] with [employer] is following up.  She states anesthesia is requesting clarification regarding additional testing that will be needed. She states the patient's procedure is scheduled for 05/31/21, so she is seeking advisement ASAP. Please return call and ask for [name].

## 2021-05-31 ENCOUNTER — Ambulatory Visit (HOSPITAL_COMMUNITY): Payer: Medicare Other | Admitting: Physician Assistant

## 2021-05-31 ENCOUNTER — Ambulatory Visit (HOSPITAL_COMMUNITY): Payer: Medicare Other | Admitting: Anesthesiology

## 2021-05-31 ENCOUNTER — Encounter (HOSPITAL_COMMUNITY): Payer: Self-pay | Admitting: Orthopaedic Surgery

## 2021-05-31 ENCOUNTER — Encounter (HOSPITAL_COMMUNITY)
Admission: RE | Disposition: A | Discharge status: Home / Self Care | Payer: Self-pay | Source: Home / Self Care | Attending: Orthopaedic Surgery

## 2021-05-31 ENCOUNTER — Ambulatory Visit (HOSPITAL_COMMUNITY)
Admission: RE | Admit: 2021-05-31 | Discharge: 2021-05-31 | Disposition: A | Discharge status: Home / Self Care | Payer: Medicare Other | Attending: Orthopaedic Surgery | Admitting: Orthopaedic Surgery

## 2021-05-31 DIAGNOSIS — S46011A Strain of muscle(s) and tendon(s) of the rotator cuff of right shoulder, initial encounter: Secondary | ICD-10-CM | POA: Insufficient documentation

## 2021-05-31 DIAGNOSIS — W19XXXA Unspecified fall, initial encounter: Secondary | ICD-10-CM | POA: Insufficient documentation

## 2021-05-31 DIAGNOSIS — M75101 Unspecified rotator cuff tear or rupture of right shoulder, not specified as traumatic: Secondary | ICD-10-CM | POA: Diagnosis not present

## 2021-05-31 DIAGNOSIS — Z96643 Presence of artificial hip joint, bilateral: Secondary | ICD-10-CM | POA: Insufficient documentation

## 2021-05-31 DIAGNOSIS — Z888 Allergy status to other drugs, medicaments and biological substances status: Secondary | ICD-10-CM | POA: Diagnosis not present

## 2021-05-31 DIAGNOSIS — Z955 Presence of coronary angioplasty implant and graft: Secondary | ICD-10-CM | POA: Diagnosis not present

## 2021-05-31 DIAGNOSIS — Z950 Presence of cardiac pacemaker: Secondary | ICD-10-CM | POA: Diagnosis not present

## 2021-05-31 DIAGNOSIS — G8918 Other acute postprocedural pain: Secondary | ICD-10-CM | POA: Diagnosis not present

## 2021-05-31 DIAGNOSIS — S46001A Unspecified injury of muscle(s) and tendon(s) of the rotator cuff of right shoulder, initial encounter: Secondary | ICD-10-CM | POA: Diagnosis not present

## 2021-05-31 DIAGNOSIS — I447 Left bundle-branch block, unspecified: Secondary | ICD-10-CM | POA: Diagnosis not present

## 2021-05-31 DIAGNOSIS — E43 Unspecified severe protein-calorie malnutrition: Secondary | ICD-10-CM | POA: Diagnosis not present

## 2021-05-31 HISTORY — PX: SHOULDER ARTHROSCOPY WITH ROTATOR CUFF REPAIR: SHX5685

## 2021-05-31 SURGERY — ARTHROSCOPY, SHOULDER, WITH ROTATOR CUFF REPAIR
Anesthesia: General | Site: Shoulder | Laterality: Right

## 2021-05-31 MED ORDER — MIDAZOLAM HCL 2 MG/2ML IJ SOLN
1.0000 mg | INTRAMUSCULAR | Status: DC
  Administered 2021-05-31: 1 mg via INTRAVENOUS
  Filled 2021-05-31: qty 2

## 2021-05-31 MED ORDER — HYDROCODONE-ACETAMINOPHEN 5-325 MG PO TABS: 1.0000 | ORAL_TABLET | Freq: Four times a day (QID) | ORAL | 0 refills | Status: DC | PRN

## 2021-05-31 MED ORDER — EPINEPHRINE PF 1 MG/ML IJ SOLN
INTRAMUSCULAR | Status: AC
  Filled 2021-05-31: qty 1

## 2021-05-31 MED ORDER — SODIUM CHLORIDE 0.9 % IR SOLN
Status: DC | PRN
  Administered 2021-05-31: 6000 mL

## 2021-05-31 MED ORDER — AMISULPRIDE (ANTIEMETIC) 5 MG/2ML IV SOLN: 10.0000 mg | Freq: Once | INTRAVENOUS | Status: DC | PRN

## 2021-05-31 MED ORDER — FENTANYL CITRATE PF 50 MCG/ML IJ SOSY
50.0000 ug | PREFILLED_SYRINGE | Freq: Once | INTRAMUSCULAR | Status: AC
  Administered 2021-05-31: 50 ug via INTRAVENOUS
  Filled 2021-05-31: qty 2

## 2021-05-31 MED ORDER — PHENYLEPHRINE 40 MCG/ML (10ML) SYRINGE FOR IV PUSH (FOR BLOOD PRESSURE SUPPORT)
PREFILLED_SYRINGE | INTRAVENOUS | Status: DC | PRN
  Administered 2021-05-31: 80 ug via INTRAVENOUS
  Administered 2021-05-31: 120 ug via INTRAVENOUS

## 2021-05-31 MED ORDER — PROMETHAZINE HCL 25 MG/ML IJ SOLN: 6.2500 mg | INTRAMUSCULAR | Status: DC | PRN

## 2021-05-31 MED ORDER — CEFAZOLIN SODIUM-DEXTROSE 2-4 GM/100ML-% IV SOLN
2.0000 g | INTRAVENOUS | Status: AC
  Administered 2021-05-31: 2 g via INTRAVENOUS
  Filled 2021-05-31: qty 100

## 2021-05-31 MED ORDER — LACTATED RINGERS IV SOLN: INTRAVENOUS | Status: DC

## 2021-05-31 MED ORDER — ONDANSETRON HCL 4 MG/2ML IJ SOLN
INTRAMUSCULAR | Status: DC | PRN
  Administered 2021-05-31: 4 mg via INTRAVENOUS

## 2021-05-31 MED ORDER — BUPIVACAINE LIPOSOME 1.3 % IJ SUSP
INTRAMUSCULAR | Status: DC | PRN
  Administered 2021-05-31: 10 mL via PERINEURAL

## 2021-05-31 MED ORDER — EPINEPHRINE PF 1 MG/ML IJ SOLN
INTRAMUSCULAR | Status: DC | PRN
  Administered 2021-05-31: 1 mg

## 2021-05-31 MED ORDER — PHENYLEPHRINE HCL-NACL 20-0.9 MG/250ML-% IV SOLN
INTRAVENOUS | Status: DC | PRN
  Administered 2021-05-31: 25 ug/min via INTRAVENOUS

## 2021-05-31 MED ORDER — PHENYLEPHRINE HCL (PRESSORS) 10 MG/ML IV SOLN
INTRAVENOUS | Status: AC
  Filled 2021-05-31: qty 1

## 2021-05-31 MED ORDER — ALBUMIN HUMAN 5 % IV SOLN
INTRAVENOUS | Status: AC
  Filled 2021-05-31: qty 250

## 2021-05-31 MED ORDER — ONDANSETRON HCL 4 MG/2ML IJ SOLN
INTRAMUSCULAR | Status: AC
  Filled 2021-05-31: qty 2

## 2021-05-31 MED ORDER — ETOMIDATE 2 MG/ML IV SOLN
INTRAVENOUS | Status: DC | PRN
  Administered 2021-05-31: 20 mg via INTRAVENOUS

## 2021-05-31 MED ORDER — SUGAMMADEX SODIUM 200 MG/2ML IV SOLN
INTRAVENOUS | Status: DC | PRN
  Administered 2021-05-31: 200 mg via INTRAVENOUS

## 2021-05-31 MED ORDER — DEXAMETHASONE SODIUM PHOSPHATE 10 MG/ML IJ SOLN
INTRAMUSCULAR | Status: AC
  Filled 2021-05-31: qty 1

## 2021-05-31 MED ORDER — ROCURONIUM BROMIDE 10 MG/ML (PF) SYRINGE
PREFILLED_SYRINGE | INTRAVENOUS | Status: AC
  Filled 2021-05-31: qty 10

## 2021-05-31 MED ORDER — DEXAMETHASONE SODIUM PHOSPHATE 10 MG/ML IJ SOLN
INTRAMUSCULAR | Status: DC | PRN
  Administered 2021-05-31: 5 mg via INTRAVENOUS

## 2021-05-31 MED ORDER — PROPOFOL 10 MG/ML IV BOLUS
INTRAVENOUS | Status: AC
  Filled 2021-05-31: qty 20

## 2021-05-31 MED ORDER — ALBUMIN HUMAN 5 % IV SOLN: INTRAVENOUS | Status: DC | PRN

## 2021-05-31 MED ORDER — FENTANYL CITRATE PF 50 MCG/ML IJ SOSY: 50.0000 ug | PREFILLED_SYRINGE | INTRAMUSCULAR | Status: DC

## 2021-05-31 MED ORDER — ORAL CARE MOUTH RINSE: 15.0000 mL | Freq: Once | OROMUCOSAL | Status: AC

## 2021-05-31 MED ORDER — ROCURONIUM BROMIDE 10 MG/ML (PF) SYRINGE
PREFILLED_SYRINGE | INTRAVENOUS | Status: DC | PRN
  Administered 2021-05-31: 70 mg via INTRAVENOUS

## 2021-05-31 MED ORDER — BUPIVACAINE HCL (PF) 0.25 % IJ SOLN
INTRAMUSCULAR | Status: DC | PRN
  Administered 2021-05-31: 15 mL via EPIDURAL

## 2021-05-31 MED ORDER — CHLORHEXIDINE GLUCONATE 0.12 % MT SOLN
15.0000 mL | Freq: Once | OROMUCOSAL | Status: AC
  Administered 2021-05-31: 15 mL via OROMUCOSAL

## 2021-05-31 MED ORDER — FENTANYL CITRATE PF 50 MCG/ML IJ SOSY: 25.0000 ug | PREFILLED_SYRINGE | INTRAMUSCULAR | Status: DC | PRN

## 2021-05-31 MED ORDER — LIDOCAINE HCL (PF) 2 % IJ SOLN
INTRAMUSCULAR | Status: AC
  Filled 2021-05-31: qty 5

## 2021-05-31 MED ORDER — POVIDONE-IODINE 10 % EX SWAB
2.0000 "application " | Freq: Once | CUTANEOUS | Status: AC
  Administered 2021-05-31: 2 via TOPICAL

## 2021-05-31 SURGICAL SUPPLY — 35 items
BAG COUNTER SPONGE SURGICOUNT (BAG) ×2 IMPLANT
BLADE EXCALIBUR 4.0X13 (MISCELLANEOUS) ×2 IMPLANT
CANISTER SUCT 3000ML PPV (MISCELLANEOUS) IMPLANT
CANNULA SHOULDER 7CM (CANNULA) ×2 IMPLANT
CANNULA TWIST IN 8.25X7CM (CANNULA) IMPLANT
DECANTER SPIKE VIAL GLASS SM (MISCELLANEOUS) IMPLANT
DRAPE 3/4 80X56 (DRAPES) ×2 IMPLANT
DRAPE STERI 35X30 U-POUCH (DRAPES) ×2 IMPLANT
DRAPE U-SHAPE 47X51 STRL (DRAPES) ×2 IMPLANT
DRSG EMULSION OIL 3X3 NADH (GAUZE/BANDAGES/DRESSINGS) ×2 IMPLANT
DRSG PAD ABDOMINAL 8X10 ST (GAUZE/BANDAGES/DRESSINGS) ×4 IMPLANT
DURAPREP 26ML APPLICATOR (WOUND CARE) ×2 IMPLANT
ELECT REM PT RETURN 15FT ADLT (MISCELLANEOUS) IMPLANT
GAUZE SPONGE 4X4 12PLY STRL (GAUZE/BANDAGES/DRESSINGS) ×2 IMPLANT
GLOVE SRG 8 PF TXTR STRL LF DI (GLOVE) ×2 IMPLANT
GLOVE SURG ENC MOIS LTX SZ8 (GLOVE) ×4 IMPLANT
GLOVE SURG UNDER POLY LF SZ8 (GLOVE) ×4
GOWN STRL REUS W/ TWL XL LVL3 (GOWN DISPOSABLE) ×2 IMPLANT
GOWN STRL REUS W/TWL XL LVL3 (GOWN DISPOSABLE) ×4
KIT BASIN OR (CUSTOM PROCEDURE TRAY) ×2 IMPLANT
MANIFOLD NEPTUNE II (INSTRUMENTS) IMPLANT
NEEDLE SCORPION MULTI FIRE (NEEDLE) IMPLANT
NS IRRIG 1000ML POUR BTL (IV SOLUTION) IMPLANT
PACK ARTHROSCOPY DSU (CUSTOM PROCEDURE TRAY) ×2 IMPLANT
PORT APPOLLO RF 90DEGREE MULTI (SURGICAL WAND) ×2 IMPLANT
SLING ARM FOAM STRAP MED (SOFTGOODS) IMPLANT
SLING ARM FOAM STRAP XLG (SOFTGOODS) IMPLANT
SLING ARM IMMOBILIZER LRG (SOFTGOODS) IMPLANT
SPONGE T-LAP 4X18 ~~LOC~~+RFID (SPONGE) ×2 IMPLANT
STRIP CLOSURE SKIN 1/2X4 (GAUZE/BANDAGES/DRESSINGS) IMPLANT
SUT ETHILON 3 0 PS 1 (SUTURE) ×2 IMPLANT
TAPE CLOTH SURG 4X10 WHT LF (GAUZE/BANDAGES/DRESSINGS) ×2 IMPLANT
TOWEL OR 17X26 10 PK STRL BLUE (TOWEL DISPOSABLE) ×2 IMPLANT
TUBING ARTHROSCOPY IRRIG 16FT (MISCELLANEOUS) ×2 IMPLANT
WATER STERILE IRR 1000ML POUR (IV SOLUTION) ×2 IMPLANT

## 2021-05-31 NOTE — Anesthesia Procedure Notes (Addendum)
Anesthesia Regional Block: Interscalene brachial plexus block   Pre-Anesthetic Checklist: , timeout performed,  Correct Patient, Correct Site, Correct Laterality,  Correct Procedure, Correct Position, site marked,  Risks and benefits discussed,  Surgical consent,  Pre-op evaluation,  At surgeon's request and post-op pain management  Laterality: Right  Prep: chloraprep       Needles:  Injection technique: Single-shot  Needle Type: Echogenic Stimulator Needle     Needle Length: 5cm  Needle Gauge: 22     Additional Needles:   Narrative:  Start time: 05/31/2021 3:31 PM End time: 05/31/2021 3:41 PM Injection made incrementally with aspirations every 5 mL.  Performed by: Personally  Anesthesiologist: Duane Boston, MD  Additional Notes: Functioning IV was confirmed and monitors applied.  A 63mm 22ga echogenic arrow stimulator was used. Sterile prep and drape,hand hygiene and sterile gloves were used.Ultrasound guidance: relevant anatomy identified, needle position confirmed, local anesthetic spread visualized around nerve(s)., vascular puncture avoided.  Image printed for medical record.  Negative aspiration and negative test dose prior to incremental administration of local anesthetic. The patient tolerated the procedure well.

## 2021-05-31 NOTE — Progress Notes (Signed)
Assisted Dr. Tobias Alexander with right, ultrasound guided, interscalene  block. Side rails up, monitors on throughout procedure. See vital signs in flow sheet. Tolerated Procedure well.   No BP taken after 1535 as A line was placed left wrist. Shoulder surgery right side.

## 2021-05-31 NOTE — Transfer of Care (Signed)
Immediate Anesthesia Transfer of Care Note  Patient: [name]  Procedure(s) Performed: RIGHT SHOULDER ARTHROSCOPY ACROMIOPLASTY AND DEBRIDEMENT (Right: Shoulder)  Patient Location: PACU  Anesthesia Type:GA combined with regional for post-op pain  Level of Consciousness: awake, alert , oriented and patient cooperative  Airway & Oxygen Therapy: Patient Spontanous Breathing and Patient connected to face mask oxygen  Post-op Assessment: Report given to RN and Post -op Vital signs reviewed and stable  Post vital signs: Reviewed and stable  Last Vitals:  Vitals Value Taken Time  BP 125/70 05/31/21 1707  Temp    Pulse 70 05/31/21 1711  Resp 26 05/31/21 1711  SpO2 99 % 05/31/21 1711  Vitals shown include unvalidated device data.  Last Pain:  Vitals:   05/31/21 1545  PainSc: 0-No pain         Complications: No notable events documented.

## 2021-05-31 NOTE — Anesthesia Postprocedure Evaluation (Signed)
Anesthesia Post Note  Patient: [name]  Procedure(s) Performed: RIGHT SHOULDER ARTHROSCOPY ACROMIOPLASTY AND DEBRIDEMENT (Right: Shoulder)     Patient location during evaluation: PACU Anesthesia Type: General Level of consciousness: sedated Pain management: pain level controlled Vital Signs Assessment: post-procedure vital signs reviewed and stable Respiratory status: spontaneous breathing and respiratory function stable Cardiovascular status: stable Postop Assessment: no apparent nausea or vomiting Anesthetic complications: no   No notable events documented.  Last Vitals:  Vitals:   05/31/21 1745 05/31/21 1756  BP: 109/67 115/62  Pulse: 60 60  Resp: (!) 22 17  Temp:    SpO2: 95% 100%    Last Pain:  Vitals:   05/31/21 1756  PainSc: 0-No pain                 Ladona Rosten DANIEL

## 2021-05-31 NOTE — Interval H&P Note (Signed)
History and Physical Interval Note:  05/31/2021 3:20 PM  [name]  has presented today for surgery, with the diagnosis of [place].  The various methods of treatment have been discussed with the patient and family. After consideration of risks, benefits and other options for treatment, the patient has consented to  Procedure(s): RIGHT SHOULDER ARTHROSCOPY AND ROTATOR CUFF REPAIR (Right) as a surgical intervention.  The patient's history has been reviewed, patient examined, no change in status, stable for surgery.  I have reviewed the patient's chart and labs.  Questions were answered to the patient's satisfaction.     Hessie Dibble

## 2021-05-31 NOTE — Brief Op Note (Signed)
  ALERIC FROELICH [id number] 05/31/2021   PRE-OP DIAGNOSIS: right sh RCT  POST-OP DIAGNOSIS: same  PROCEDURE: right sh scope  ANESTHESIA: general and block  Hessie Dibble   Dictation #:  [id number]

## 2021-05-31 NOTE — Anesthesia Procedure Notes (Signed)
Procedure Name: Intubation Date/Time: 05/31/2021 4:16 PM Performed by: Raenette Rover, CRNA Pre-anesthesia Checklist: Patient identified, Emergency Drugs available, Suction available and Patient being monitored Patient Re-evaluated:Patient Re-evaluated prior to induction Oxygen Delivery Method: Circle system utilized Preoxygenation: Pre-oxygenation with 100% oxygen Induction Type: IV induction Ventilation: Mask ventilation without difficulty Laryngoscope Size: Miller and 3 Grade View: Grade I Tube type: Oral Tube size: 7.5 mm Number of attempts: 1 Airway Equipment and Method: Stylet Placement Confirmation: ETT inserted through vocal cords under direct vision, positive ETCO2 and breath sounds checked- equal and bilateral Secured at: 22 cm Tube secured with: Tape Dental Injury: Teeth and Oropharynx as per pre-operative assessment

## 2021-05-31 NOTE — Anesthesia Procedure Notes (Signed)
Arterial Line Insertion Start/End9/20/2022 3:40 PM, 05/31/2021 3:49 PM Performed by: Duane Boston, MD, Raenette Rover, CRNA, CRNA  Patient location: Pre-op. Preanesthetic checklist: patient identified, IV checked, site marked, risks and benefits discussed, surgical consent, monitors and equipment checked, pre-op evaluation, timeout performed and anesthesia consent Lidocaine 1% used for infiltration and patient sedated Left, radial was placed Catheter size: 20 G Hand hygiene performed  and maximum sterile barriers used   Attempts: 1 Procedure performed without using ultrasound guided technique. Following insertion, dressing applied and Biopatch. Post procedure assessment: normal and unchanged  Patient tolerated the procedure well with no immediate complications.

## 2021-06-01 NOTE — Op Note (Signed)
NAMEALLENMICHAEL, MCPARTLIN MEDICAL RECORD NO: [id number] ACCOUNT NO: [id number] DATE OF BIRTH: 1940/12/20 FACILITY: [facility] LOCATION: [facility] PHYSICIAN: Monico Blitz. Rhona Raider, MD  Operative Report   DATE OF PROCEDURE: 05/31/2021  PREOPERATIVE DIAGNOSIS:  Right shoulder rotator cuff tear.  POSTOPERATIVE DIAGNOSIS:  Right shoulder rotator cuff tear.  PROCEDURE PERFORMED:   1.  Right shoulder arthroscopic debridement. 2.  Right shoulder arthroscopic acromioplasty.  ANESTHESIA:  General and block.  ATTENDING SURGEON:  Melrose Nakayama, MD  ASSISTANT:  Loni Dolly, PA  INDICATIONS:  The patient is an 80 year old man with many months of some severe right shoulder pain after a fall.  By ultrasound, he was diagnosed with a rotator cuff tear.  He failed some conservative measures of injection and physical therapy and is  offered an arthroscopy.  Informed operative consent was obtained after discussion of possible complications including reaction to anesthesia and infection.  SUMMARY OF FINDINGS AND PROCEDURE:  Under general anesthesia and a block, an arthroscopy of the right shoulder was performed.  Glenohumeral joint showed minimal degenerative change.  His biceps tendon was absent.  He had a massive rotator cuff tear with  a large portion of the rotator cuff stuck in the joint.  This was not repairable.  We elected to debride.  He had a moderately prominent subacromial morphology addressed with an acromioplasty.  He was scheduled to potentially go home same day.  DESCRIPTION OF PROCEDURE:  The patient was taken to the operating suite where general anesthetic was applied without difficulty.  He was also given a block in the preanesthesia area.  He was positioned supine and prepped and draped in normal sterile  fashion.  After the administration of preoperative IV Kefzol and appropriate time-out, an arthroscopy of the right shoulder was performed through a total of 3 portals.  Glenohumeral joint showed  minimal degenerative change as described above.  I  performed an extensive debridement of an irreparable rotator cuff tear.  We debrided extensively the remaining supraspinatus and infraspinatus and biceps stump.  I extensively debrided the subacromial bursa and subdeltoid bursa.  We debrided the humeral  head a bit where he had some grade III chondromalacia.  I removed some loose fragments from the axillary recess.  I then performed an acromioplasty with the bur in the lateral position followed by transfer of the bur to the posterior position.  The  shoulder was thoroughly irrigated, followed by removal of arthroscopic equipment.  We sutured the portals loosely with nylon followed by Adaptic, dry gauze and tape.  Estimated blood loss and intraoperative fluids can be obtained from anesthesia records.  DISPOSITION:  The patient was extubated in the operating room and taken to recovery room in stable condition.  Plans were for him to potentially go home same day and to follow up in the office in less than a week.  I will contact him by phone tonight.   SHW D: 05/31/2021 5:03:32 pm T: 05/31/2021 11:59:00 pm  JOB: [id number]/ [id number]

## 2021-06-01 NOTE — Addendum Note (Signed)
Addendum  created 06/01/21 8676 by Duane Boston, MD   Clinical Note Signed

## 2021-06-02 ENCOUNTER — Encounter (HOSPITAL_COMMUNITY): Payer: Self-pay | Admitting: Orthopaedic Surgery

## 2021-06-06 ENCOUNTER — Other Ambulatory Visit: Payer: Medicare Other | Admitting: *Deleted

## 2021-06-06 ENCOUNTER — Other Ambulatory Visit: Payer: Self-pay

## 2021-06-06 DIAGNOSIS — I480 Paroxysmal atrial fibrillation: Secondary | ICD-10-CM | POA: Diagnosis not present

## 2021-06-06 LAB — BASIC METABOLIC PANEL
BUN/Creatinine Ratio: 14 (ref 10–24)
BUN: 17 mg/dL (ref 8–27)
CO2: 24 mmol/L (ref 20–29)
Calcium: 9.7 mg/dL (ref 8.6–10.2)
Chloride: 105 mmol/L (ref 96–106)
Creatinine, Ser: 1.18 mg/dL (ref 0.76–1.27)
Glucose: 86 mg/dL (ref 70–99)
Potassium: 4.7 mmol/L (ref 3.5–5.2)
Sodium: 141 mmol/L (ref 134–144)
eGFR: 62 mL/min/{1.73_m2} (ref >59)

## 2021-06-06 LAB — CBC WITH DIFFERENTIAL/PLATELET
Basophils Absolute: 0 10*3/uL (ref 0.0–0.2)
Basos: 1 %
EOS (ABSOLUTE): 0.2 10*3/uL (ref 0.0–0.4)
Eos: 4 %
Hematocrit: 41.9 % (ref 37.5–51.0)
Hemoglobin: 14 g/dL (ref 13.0–17.7)
Immature Grans (Abs): 0 10*3/uL (ref 0.0–0.1)
Immature Granulocytes: 0 %
Lymphocytes Absolute: 1.2 10*3/uL (ref 0.7–3.1)
Lymphs: 22 %
MCH: 32.4 pg (ref 26.6–33.0)
MCHC: 33.4 g/dL (ref 31.5–35.7)
MCV: 97 fL (ref 79–97)
Monocytes Absolute: 0.6 10*3/uL (ref 0.1–0.9)
Monocytes: 12 %
Neutrophils Absolute: 3.4 10*3/uL (ref 1.4–7.0)
Neutrophils: 61 %
Platelets: 156 10*3/uL (ref 150–450)
RBC: 4.32 x10E6/uL (ref 4.14–5.80)
RDW: 12.5 % (ref 11.6–15.4)
WBC: 5.5 10*3/uL (ref 3.4–10.8)

## 2021-06-13 DIAGNOSIS — R531 Weakness: Secondary | ICD-10-CM | POA: Diagnosis not present

## 2021-06-13 DIAGNOSIS — Z9889 Other specified postprocedural states: Secondary | ICD-10-CM | POA: Diagnosis not present

## 2021-06-13 DIAGNOSIS — M25611 Stiffness of right shoulder, not elsewhere classified: Secondary | ICD-10-CM | POA: Diagnosis not present

## 2021-06-13 DIAGNOSIS — M25511 Pain in right shoulder: Secondary | ICD-10-CM | POA: Diagnosis not present

## 2021-06-15 DIAGNOSIS — M25611 Stiffness of right shoulder, not elsewhere classified: Secondary | ICD-10-CM | POA: Diagnosis not present

## 2021-06-15 DIAGNOSIS — M25511 Pain in right shoulder: Secondary | ICD-10-CM | POA: Diagnosis not present

## 2021-06-15 DIAGNOSIS — R531 Weakness: Secondary | ICD-10-CM | POA: Diagnosis not present

## 2021-06-15 DIAGNOSIS — Z9889 Other specified postprocedural states: Secondary | ICD-10-CM | POA: Diagnosis not present

## 2021-06-17 ENCOUNTER — Encounter: Payer: Self-pay | Admitting: Gastroenterology

## 2021-06-17 ENCOUNTER — Ambulatory Visit (INDEPENDENT_AMBULATORY_CARE_PROVIDER_SITE_OTHER): Payer: Medicare Other | Admitting: Gastroenterology

## 2021-06-17 VITALS — BP 102/70 | HR 75 | Ht 75.0 in | Wt 201.2 lb

## 2021-06-17 DIAGNOSIS — Z8719 Personal history of other diseases of the digestive system: Secondary | ICD-10-CM | POA: Diagnosis not present

## 2021-06-17 NOTE — Patient Instructions (Addendum)
If you are age 81 or older, your body mass index should be between 23-30. Your Body mass index is 25.15 kg/m. If this is out of the aforementioned range listed, please consider follow up with your Primary Care Provider. __________________________________________________________  The [facility] providers would like to encourage you to use [facility] to communicate with providers for non-urgent requests or questions.  Due to long hold times on the telephone, sending your provider a message by [facility] may be a faster and more efficient way to get a response.  Please allow 48 business hours for a response.  Please remember that this is for non-urgent requests.   It has been recommended to you by your physician that you have a colonoscopy completed. Per your request, we did not schedule the procedure(s) today. Please contact our office at [phone] should you decide to have the procedure completed. You will be scheduled for a pre-visit and procedure at that time.  DISCONTINUE: Vancomycin  Thank you for entrusting me with your care and choosing [facility].  Dr Ardis Hughs

## 2021-06-17 NOTE — Progress Notes (Signed)
Review of pertinent gastrointestinal problems: 1.  Precancerous colon polyps;  Colonoscopy July 2016 Dr. Ardis Hughs , done for routine screening found to small polyps one was a tubular adenoma. Also diverticulosis. He was recommended to have repeat colonoscopy 5 years 2.  Complicated left-sided colonic diverticulitis April, May 2022 requiring IV antibiotics, drain placement, PICC line. 3.  Diverticulitis treatment with antibiotics led to C. difficile infection confirmed by toxin positive diarrhea testing.  Tended to manage his vancomycin dosing on his own   HPI: This is a very pleasant 80 year old man.  Blood work September 2022 showed normal CBC, normal complete metabolic profile.  He is on Eliquis  Lately he has been on vancomycin 1 pill once weekly.  He has had no C. difficile type symptoms for at least 2 or 3 weeks after a long tapering regimen.  He really feels back to normal.   ROS: complete GI ROS as described in HPI, all other review negative.  Constitutional:  No unintentional weight loss   Past Medical History:  Diagnosis Date   Allergic rhinitis    takes Allegra daily   Allergic rhinitis, cause unspecified 06/22/2012   Arthritis    Atrial fibrillation ([place])    takes Tikosyn and Eliquis daily   Bilateral popliteal artery aneurysm (HCC)    Chronic kidney disease (CKD), stage III (moderate) (HCC)    Coronary artery disease    LAD stenting 2004 non-DES   Depression    took Zoloft 48yrs ago but nothing now   Diverticulosis    DVT (deep venous thrombosis) ([place])    Dysphagia    occasionally   History of blood clots 2003   left leg prior to fem pop   History of colon polyps    Hyperlipidemia    takes Tricor and Lipitor daily   Insomnia    takes Ambien nightly as needed   Joint pain    Joint swelling    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   OSA (obstructive sleep apnea)    CPAP   Pacemaker    Peripheral vascular disease ([place])    Pleurisy     early 80's   Prostatitis    Urinary urgency     Past Surgical History:  Procedure Laterality Date   CARDIAC CATHETERIZATION  09/11/2002   with 2 stents   CARDIAC CATHETERIZATION N/A 05/06/2015   Procedure: Left Heart Cath and Coronary Angiography;  Surgeon: Belva Crome, MD;  Location: [place] [facility];  Service: Cardiovascular;  Laterality: N/A;   CARDIOVERSION N/A 12/18/2020   Procedure: CARDIOVERSION;  Surgeon: Josue Hector, MD;  Location: [place];  Service: Cardiovascular;  Laterality: N/A;   COLONOSCOPY     hand and arm surgery Right    as a teenager   HAND SURGERY Left    INGUINAL HERNIA REPAIR Right    IR RADIOLOGIST EVAL & MGMT  01/05/2021   JOINT REPLACEMENT Left 12/09/2013   Left Hip    JOINT REPLACEMENT Right 05/05/2014   Right Hip   left knee surgery     PACEMAKER INSERTION  09/25/1996   due to bradycardia   PERMANENT PACEMAKER GENERATOR CHANGE N/A 06/17/2012   Procedure: PERMANENT PACEMAKER GENERATOR CHANGE;  Surgeon: Deboraha Sprang, MD;  Location: [facility] C[facility];  Service: Cardiovascular;  Laterality: N/A;   right and left fem-pop bypass     SHOULDER ARTHROSCOPY WITH ROTATOR CUFF REPAIR Right 05/31/2021   Procedure: RIGHT SHOULDER ARTHROSCOPY ACROMIOPLASTY AND DEBRIDEMENT;  Surgeon: Melrose Nakayama, MD;  Location: [facility];  Service: Orthopedics;  Laterality: Right;   TOTAL HIP ARTHROPLASTY Left 12/09/2013   Procedure: TOTAL HIP ARTHROPLASTY ANTERIOR APPROACH;  Surgeon: Hessie Dibble, MD;  Location: [place];  Service: Orthopedics;  Laterality: Left;  left anterior total hip arthroplasty   TOTAL HIP ARTHROPLASTY Right 05/05/2014   Procedure: TOTAL HIP ARTHROPLASTY ANTERIOR APPROACH;  Surgeon: Hessie Dibble, MD;  Location: [place];  Service: Orthopedics;  Laterality: Right;   TURBINATE REDUCTION     wisdom extracted       Current Outpatient Medications  Medication Sig Dispense Refill   atorvastatin (LIPITOR) 80 MG tablet TAKE 1 TABLET BY MOUTH  DAILY 90 tablet 2    CALCIUM PO Take 600 mg by mouth 2 (two) times daily.     carvedilol (COREG) 25 MG tablet TAKE 2 TABLETS BY MOUTH  TWICE DAILY (Patient taking differently: Take 25 mg by mouth every 6 (six) hours. For a total of (4) 25 mg per day) 360 tablet 2   Cholecalciferol (D3 VITAMIN PO) Take 1,000 Units by mouth every 6 (six) hours. For a total of 4000 mg a day     Coenzyme Q10 (COQ-10 PO) Take 300 mg by mouth daily.     Cyanocobalamin (VITAMIN B 12) 500 MCG TABS Take 125 mcg by mouth daily.     dapagliflozin propanediol (FARXIGA) 10 MG TABS tablet Take 1 tablet (10 mg total) by mouth daily before breakfast. 90 tablet 1   dofetilide (TIKOSYN) 500 MCG capsule TAKE 1 CAPSULE BY MOUTH  TWICE DAILY (Patient taking differently: Take 500 mcg by mouth See admin instructions. Take 500 mg at noon and 500 mg at midnight) 180 capsule 2   ELIQUIS 5 MG TABS tablet TAKE 1 TABLET BY MOUTH  TWICE DAILY (Patient taking differently: Take 2.5 mg by mouth every 6 (six) hours. For a total of 10 mg daily) 180 tablet 2   fenofibrate 160 MG tablet TAKE 1 TABLET BY MOUTH  DAILY (Patient taking differently: Take 80 mg by mouth 2 (two) times daily.) 90 tablet 2   fexofenadine (ALLEGRA) 180 MG tablet Take 90 mg by mouth 2 (two) times daily.     Fluocinonide Emulsified Base 0.05 % CREA APPLY TO AFFECTED AREA(S)  TWO TIMES DAILY AS NEEDED 60 g 2   folic acid (FOLVITE) 412 MCG tablet Take 400 mcg by mouth daily.     Lysine 500 MG TABS Take 500 mg by mouth 3 (three) times daily.     Magnesium 250 MG TABS Take 250 mg by mouth 2 (two) times daily.      potassium chloride (MICRO-K) 10 MEQ CR capsule Take 10 mEq by mouth 2 (two) times daily.     ranolazine (RANEXA) 500 MG 12 hr tablet TAKE 1 TABLET BY MOUTH  TWICE DAILY (Patient taking differently: Take 500 mg by mouth 2 (two) times daily. Noon and Midnight) 180 tablet 3   sacubitril-valsartan (ENTRESTO) 24-26 MG Take 1 tablet by mouth 2 (two) times daily. (Patient taking differently: Take 0.5  tablets by mouth every 6 (six) hours. For a total of two tablets daily) 180 tablet 3   No current facility-administered medications for this visit.    Allergies as of 06/17/2021 - Review Complete 06/17/2021  Allergen Reaction Noted   Amiodarone Other (See Comments) 01/07/2008   Niacin Other (See Comments)    Ace inhibitors Cough 01/07/2008   Mometasone furoate Other (See Comments) 02/21/2011    Family  History  Problem Relation Age of Onset   Diabetes Other        family hx   Sleep apnea Other        family hx   Diabetes Mother    Heart disease Mother        Before age 42   Heart disease Father        Before age 3   Heart attack Father    Stroke Father    Aneurysm Father        bilat pop art aneurysms   Peripheral vascular disease Father        Popliteal Aneurysm   Allergic rhinitis Neg Hx    Angioedema Neg Hx    Asthma Neg Hx    Eczema Neg Hx    Immunodeficiency Neg Hx    Urticaria Neg Hx     Social History   Socioeconomic History   Marital status: Married    Spouse name: Not on file   Number of children: Not on file   Years of education: Not on file   Highest education level: Not on file  Occupational History   Not on file  Tobacco Use   Smoking status: Never   Smokeless tobacco: Never  Vaping Use   Vaping Use: Never used  Substance and Sexual Activity   Alcohol use: No    Alcohol/week: 0.0 standard drinks    Comment: nothing since 2000   Drug use: No   Sexual activity: Yes  Other Topics Concern   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    Social Determinants of Health   Financial Resource Strain: Low Risk    Difficulty of Paying Living Expenses: Not hard at all  Food Insecurity: No Food Insecurity   Worried About Charity fundraiser in the Last Year: Never true   [place] in the Last Year: Never true  Transportation Needs: No Transportation Needs   Lack of Transportation (Medical): No    Lack of Transportation (Non-Medical): No  Physical Activity: Sufficiently Active   Days of Exercise per Week: 7 days   Minutes of Exercise per Session: 60 min  Stress: No Stress Concern Present   Feeling of Stress : Not at all  Social Connections: Socially Integrated   Frequency of Communication with Friends and Family: More than three times a week   Frequency of Social Gatherings with Friends and Family: More than three times a week   Attends Religious Services: More than 4 times per year   Active Member of [employer] or Organizations: Yes   Attends Music therapist: More than 4 times per year   Marital Status: Married  Human resources officer Violence: Not on file     Physical Exam: BP 102/70   Pulse 75   Ht 6\' 3"  (1.905 m)   Wt 201 lb 4 oz (91.3 kg)   SpO2 99%   BMI 25.15 kg/m   Constitutional: generally well-appearing Psychiatric: alert and oriented x3 Abdomen: soft, nontender, nondistended, no obvious ascites, no peritoneal signs, normal bowel sounds No peripheral edema noted in lower extremities  Assessment and plan: 80 y.o. male with severe acute diverticulitis, complicated by C. difficile infection  He has been managing his vancomycin sort of ad lib. on his own but it does sound like he has probably passed the infection now I recommended he completely stop vancomycin.  We discussed the fact that it  is very likely that what he indeed suffered was acute diverticulitis however sometimes a small perforated colon cancer can act identically and so I recommended a colonoscopy at his soonest convenience to exclude this.  He will need to be off of his Eliquis for 2 days prior and we will make sure that his prescribing physician is okay with that recommendation.  I also recommended that he at least sit down with a surgeon to consider elective surgical resection of his left colon given his severe acute diverticulitis which led to a 10-day hospital stay, brief intensive care unit  stay, drain placement for abscess.  He does not want to do that until released after the colonoscopy which seems reasonable.  Please see the "Patient Instructions" section for addition details about the plan.  Owens Loffler, MD [facility] 06/17/2021, 10:57 AM   Total time on date of encounter was 35 minutes (this included time spent preparing to see the patient reviewing records; obtaining and/or reviewing separately obtained history; performing a medically appropriate exam and/or evaluation; counseling and educating the patient and family if present; ordering medications, tests or procedures if applicable; and documenting clinical information in the health record).

## 2021-06-21 DIAGNOSIS — R531 Weakness: Secondary | ICD-10-CM | POA: Diagnosis not present

## 2021-06-21 DIAGNOSIS — M25511 Pain in right shoulder: Secondary | ICD-10-CM | POA: Diagnosis not present

## 2021-06-21 DIAGNOSIS — Z9889 Other specified postprocedural states: Secondary | ICD-10-CM | POA: Diagnosis not present

## 2021-06-21 DIAGNOSIS — M25611 Stiffness of right shoulder, not elsewhere classified: Secondary | ICD-10-CM | POA: Diagnosis not present

## 2021-06-21 NOTE — Telephone Encounter (Signed)
Thank you- will proceed with 02-2021 Eliquis hold  Marijean Niemann

## 2021-06-21 NOTE — Telephone Encounter (Signed)
Dr Ardis Hughs,  Can we use this Eliquis hold from June 2022 For his 11-9 Colon, or would you like [place] to resend for a new hold for this colon?  Please Olevia Bowens PV

## 2021-06-22 ENCOUNTER — Telehealth (HOSPITAL_COMMUNITY): Payer: Self-pay | Admitting: *Deleted

## 2021-06-22 NOTE — Telephone Encounter (Signed)
Attempted to call patient regarding upcoming cardiac CT appointment. °Left message on voicemail with name and callback number ° °Ravin Bendall RN Navigator Cardiac Imaging °[facility] °[phone] Office °[phone] Cell ° °

## 2021-06-23 ENCOUNTER — Ambulatory Visit (HOSPITAL_COMMUNITY)
Admission: RE | Admit: 2021-06-23 | Discharge: 2021-06-23 | Disposition: A | Discharge status: Home / Self Care | Payer: Medicare Other | Source: Ambulatory Visit | Attending: Internal Medicine | Admitting: Internal Medicine

## 2021-06-23 ENCOUNTER — Telehealth (HOSPITAL_COMMUNITY): Payer: Self-pay | Admitting: *Deleted

## 2021-06-23 ENCOUNTER — Encounter (HOSPITAL_COMMUNITY): Payer: Self-pay

## 2021-06-23 ENCOUNTER — Other Ambulatory Visit: Payer: Self-pay

## 2021-06-23 DIAGNOSIS — M25611 Stiffness of right shoulder, not elsewhere classified: Secondary | ICD-10-CM | POA: Diagnosis not present

## 2021-06-23 DIAGNOSIS — M25511 Pain in right shoulder: Secondary | ICD-10-CM | POA: Diagnosis not present

## 2021-06-23 DIAGNOSIS — Z9889 Other specified postprocedural states: Secondary | ICD-10-CM | POA: Diagnosis not present

## 2021-06-23 DIAGNOSIS — R531 Weakness: Secondary | ICD-10-CM | POA: Diagnosis not present

## 2021-06-23 DIAGNOSIS — I480 Paroxysmal atrial fibrillation: Secondary | ICD-10-CM | POA: Diagnosis not present

## 2021-06-23 MED ORDER — IOHEXOL 350 MG/ML SOLN
80.0000 mL | Freq: Once | INTRAVENOUS | Status: AC | PRN
  Administered 2021-06-23: 80 mL via INTRAVENOUS

## 2021-06-23 NOTE — Telephone Encounter (Signed)
Patient returning call regarding upcoming cardiac imaging study; pt verbalizes understanding of appt date/time, parking situation and where to check in, pre-test NPO status and verified current allergies; name and call back number provided for further questions should they arise  Gordy Clement RN Navigator Cardiac Imaging [facility] [phone] office [phone] cell

## 2021-06-28 ENCOUNTER — Encounter: Payer: Medicare Other | Admitting: Internal Medicine

## 2021-06-28 DIAGNOSIS — M25511 Pain in right shoulder: Secondary | ICD-10-CM | POA: Diagnosis not present

## 2021-06-28 DIAGNOSIS — R531 Weakness: Secondary | ICD-10-CM | POA: Diagnosis not present

## 2021-06-28 DIAGNOSIS — M25611 Stiffness of right shoulder, not elsewhere classified: Secondary | ICD-10-CM | POA: Diagnosis not present

## 2021-06-28 DIAGNOSIS — Z9889 Other specified postprocedural states: Secondary | ICD-10-CM | POA: Diagnosis not present

## 2021-06-29 DIAGNOSIS — M25611 Stiffness of right shoulder, not elsewhere classified: Secondary | ICD-10-CM | POA: Diagnosis not present

## 2021-06-29 DIAGNOSIS — M25511 Pain in right shoulder: Secondary | ICD-10-CM | POA: Diagnosis not present

## 2021-06-29 DIAGNOSIS — Z9889 Other specified postprocedural states: Secondary | ICD-10-CM | POA: Diagnosis not present

## 2021-06-29 DIAGNOSIS — R531 Weakness: Secondary | ICD-10-CM | POA: Diagnosis not present

## 2021-06-29 NOTE — Pre-Procedure Instructions (Signed)
Instructed patient on the following items: Arrival time 0830 Nothing to eat or drink after midnight No meds AM of procedure Responsible person to drive you home and stay with you for 24 hrs  Have you missed any doses of anti-coagulant Eliquis- hasn't missed any doses   

## 2021-06-30 ENCOUNTER — Ambulatory Visit (HOSPITAL_COMMUNITY): Payer: Medicare Other | Admitting: Anesthesiology

## 2021-06-30 ENCOUNTER — Ambulatory Visit (HOSPITAL_COMMUNITY)
Admission: RE | Admit: 2021-06-30 | Discharge: 2021-06-30 | Disposition: A | Discharge status: Home / Self Care | Payer: Medicare Other | Attending: Internal Medicine | Admitting: Internal Medicine

## 2021-06-30 ENCOUNTER — Other Ambulatory Visit: Payer: Self-pay

## 2021-06-30 ENCOUNTER — Telehealth: Payer: Self-pay | Admitting: *Deleted

## 2021-06-30 ENCOUNTER — Encounter (HOSPITAL_COMMUNITY)
Admission: RE | Disposition: A | Discharge status: Home / Self Care | Payer: Medicare Other | Source: Home / Self Care | Attending: Internal Medicine

## 2021-06-30 DIAGNOSIS — Z9989 Dependence on other enabling machines and devices: Secondary | ICD-10-CM | POA: Diagnosis not present

## 2021-06-30 DIAGNOSIS — I495 Sick sinus syndrome: Secondary | ICD-10-CM | POA: Diagnosis not present

## 2021-06-30 DIAGNOSIS — E785 Hyperlipidemia, unspecified: Secondary | ICD-10-CM | POA: Diagnosis not present

## 2021-06-30 DIAGNOSIS — I480 Paroxysmal atrial fibrillation: Secondary | ICD-10-CM | POA: Diagnosis not present

## 2021-06-30 DIAGNOSIS — Z7984 Long term (current) use of oral hypoglycemic drugs: Secondary | ICD-10-CM | POA: Diagnosis not present

## 2021-06-30 DIAGNOSIS — Z79899 Other long term (current) drug therapy: Secondary | ICD-10-CM | POA: Diagnosis not present

## 2021-06-30 DIAGNOSIS — G4733 Obstructive sleep apnea (adult) (pediatric): Secondary | ICD-10-CM | POA: Diagnosis not present

## 2021-06-30 DIAGNOSIS — Z7901 Long term (current) use of anticoagulants: Secondary | ICD-10-CM | POA: Diagnosis not present

## 2021-06-30 DIAGNOSIS — J8410 Pulmonary fibrosis, unspecified: Secondary | ICD-10-CM

## 2021-06-30 DIAGNOSIS — I4891 Unspecified atrial fibrillation: Secondary | ICD-10-CM | POA: Diagnosis not present

## 2021-06-30 HISTORY — PX: ATRIAL FIBRILLATION ABLATION: EP1191

## 2021-06-30 LAB — POCT ACTIVATED CLOTTING TIME
Activated Clotting Time: 294 seconds
Activated Clotting Time: 277 seconds

## 2021-06-30 SURGERY — ATRIAL FIBRILLATION ABLATION
Anesthesia: General

## 2021-06-30 MED ORDER — DEXAMETHASONE SODIUM PHOSPHATE 10 MG/ML IJ SOLN
INTRAMUSCULAR | Status: DC | PRN
  Administered 2021-06-30: 5 mg via INTRAVENOUS

## 2021-06-30 MED ORDER — ISOPROTERENOL HCL 0.2 MG/ML IJ SOLN
INTRAMUSCULAR | Status: AC
  Filled 2021-06-30: qty 5

## 2021-06-30 MED ORDER — HEPARIN (PORCINE) IN NACL 1000-0.9 UT/500ML-% IV SOLN
INTRAVENOUS | Status: DC | PRN
  Administered 2021-06-30 (×3): 500 mL

## 2021-06-30 MED ORDER — ROCURONIUM BROMIDE 10 MG/ML (PF) SYRINGE
PREFILLED_SYRINGE | INTRAVENOUS | Status: DC | PRN
  Administered 2021-06-30: 20 mg via INTRAVENOUS
  Administered 2021-06-30: 60 mg via INTRAVENOUS

## 2021-06-30 MED ORDER — HEPARIN SODIUM (PORCINE) 1000 UNIT/ML IJ SOLN
INTRAMUSCULAR | Status: DC | PRN
  Administered 2021-06-30: 5000 [IU] via INTRAVENOUS
  Administered 2021-06-30: 3000 [IU] via INTRAVENOUS

## 2021-06-30 MED ORDER — HEPARIN SODIUM (PORCINE) 1000 UNIT/ML IJ SOLN
INTRAMUSCULAR | Status: AC
  Filled 2021-06-30: qty 2

## 2021-06-30 MED ORDER — SODIUM CHLORIDE 0.9 % IV SOLN: 250.0000 mL | INTRAVENOUS | Status: DC | PRN

## 2021-06-30 MED ORDER — APIXABAN 5 MG PO TABS
5.0000 mg | ORAL_TABLET | Freq: Once | ORAL | Status: AC
  Administered 2021-06-30: 5 mg via ORAL
  Filled 2021-06-30: qty 1

## 2021-06-30 MED ORDER — ISOPROTERENOL HCL 0.2 MG/ML IJ SOLN
INTRAVENOUS | Status: DC | PRN
  Administered 2021-06-30: 10 ug/min via INTRAVENOUS

## 2021-06-30 MED ORDER — LIDOCAINE 2% (20 MG/ML) 5 ML SYRINGE
INTRAMUSCULAR | Status: DC | PRN
  Administered 2021-06-30: 60 mg via INTRAVENOUS

## 2021-06-30 MED ORDER — PROPOFOL 10 MG/ML IV BOLUS
INTRAVENOUS | Status: DC | PRN
  Administered 2021-06-30: 80 mg via INTRAVENOUS

## 2021-06-30 MED ORDER — SODIUM CHLORIDE 0.9 % IV SOLN: INTRAVENOUS | Status: DC

## 2021-06-30 MED ORDER — ACETAMINOPHEN 500 MG PO TABS: 1000.0000 mg | ORAL_TABLET | Freq: Once | ORAL | Status: AC

## 2021-06-30 MED ORDER — HEPARIN SODIUM (PORCINE) 1000 UNIT/ML IJ SOLN
INTRAMUSCULAR | Status: DC | PRN
  Administered 2021-06-30: 15000 [IU] via INTRAVENOUS

## 2021-06-30 MED ORDER — ONDANSETRON HCL 4 MG/2ML IJ SOLN: 4.0000 mg | Freq: Four times a day (QID) | INTRAMUSCULAR | Status: DC | PRN

## 2021-06-30 MED ORDER — PHENYLEPHRINE 40 MCG/ML (10ML) SYRINGE FOR IV PUSH (FOR BLOOD PRESSURE SUPPORT)
PREFILLED_SYRINGE | INTRAVENOUS | Status: DC | PRN
  Administered 2021-06-30 (×2): 40 ug via INTRAVENOUS
  Administered 2021-06-30: 80 ug via INTRAVENOUS
  Administered 2021-06-30: 40 ug via INTRAVENOUS
  Administered 2021-06-30: 120 ug via INTRAVENOUS
  Administered 2021-06-30: 80 ug via INTRAVENOUS

## 2021-06-30 MED ORDER — PANTOPRAZOLE SODIUM 40 MG PO TBEC: 40.0000 mg | DELAYED_RELEASE_TABLET | Freq: Every day | ORAL | 0 refills | Status: DC

## 2021-06-30 MED ORDER — FENTANYL CITRATE (PF) 100 MCG/2ML IJ SOLN
INTRAMUSCULAR | Status: DC | PRN
  Administered 2021-06-30: 100 ug via INTRAVENOUS

## 2021-06-30 MED ORDER — HYDROCODONE-ACETAMINOPHEN 5-325 MG PO TABS: 1.0000 | ORAL_TABLET | ORAL | Status: DC | PRN

## 2021-06-30 MED ORDER — SUGAMMADEX SODIUM 200 MG/2ML IV SOLN
INTRAVENOUS | Status: DC | PRN
  Administered 2021-06-30 (×2): 100 mg via INTRAVENOUS

## 2021-06-30 MED ORDER — ACETAMINOPHEN 500 MG PO TABS
ORAL_TABLET | ORAL | Status: AC
  Administered 2021-06-30: 1000 mg via ORAL
  Filled 2021-06-30: qty 2

## 2021-06-30 MED ORDER — PHENYLEPHRINE HCL-NACL 20-0.9 MG/250ML-% IV SOLN
INTRAVENOUS | Status: DC | PRN
  Administered 2021-06-30: 50 ug/min via INTRAVENOUS

## 2021-06-30 MED ORDER — ACETAMINOPHEN 325 MG PO TABS: 650.0000 mg | ORAL_TABLET | ORAL | Status: DC | PRN

## 2021-06-30 MED ORDER — SODIUM CHLORIDE 0.9% FLUSH: 3.0000 mL | Freq: Two times a day (BID) | INTRAVENOUS | Status: DC

## 2021-06-30 MED ORDER — HEPARIN (PORCINE) IN NACL 1000-0.9 UT/500ML-% IV SOLN
INTRAVENOUS | Status: AC
  Filled 2021-06-30: qty 1500

## 2021-06-30 MED ORDER — VASOPRESSIN 20 UNIT/ML IV SOLN
INTRAVENOUS | Status: DC | PRN
  Administered 2021-06-30 (×3): 1 [IU] via INTRAVENOUS

## 2021-06-30 MED ORDER — ONDANSETRON HCL 4 MG/2ML IJ SOLN
INTRAMUSCULAR | Status: DC | PRN
  Administered 2021-06-30: 4 mg via INTRAVENOUS

## 2021-06-30 MED ORDER — HEPARIN (PORCINE) IN NACL 2000-0.9 UNIT/L-% IV SOLN
INTRAVENOUS | Status: DC | PRN
  Administered 2021-06-30: 1000 mL

## 2021-06-30 MED ORDER — PROTAMINE SULFATE 10 MG/ML IV SOLN
INTRAVENOUS | Status: DC | PRN
  Administered 2021-06-30: 40 mg via INTRAVENOUS

## 2021-06-30 MED ORDER — SODIUM CHLORIDE 0.9% FLUSH: 3.0000 mL | INTRAVENOUS | Status: DC | PRN

## 2021-06-30 SURGICAL SUPPLY — 16 items
CATH OCTARAY 2.0 F 3-3-3-3-3 (CATHETERS) ×2 IMPLANT
CATH SMTCH THERMOCOOL SF DF (CATHETERS) ×2 IMPLANT
CATH SOUNDSTAR ECO 8FR (CATHETERS) ×2 IMPLANT
CATH WEB BI DIR CSDF CRV REPRO (CATHETERS) ×2 IMPLANT
CLOSURE PERCLOSE PROSTYLE (VASCULAR PRODUCTS) ×6 IMPLANT
COVER SWIFTLINK CONNECTOR (BAG) ×2 IMPLANT
NEEDLE BAYLIS TRANSSEPTAL 71CM (NEEDLE) ×2 IMPLANT
PACK EP LATEX FREE (CUSTOM PROCEDURE TRAY) ×2
PACK EP LF (CUSTOM PROCEDURE TRAY) ×1 IMPLANT
PAD PRO RADIOLUCENT 2001M-C (PAD) ×2 IMPLANT
PATCH CARTO3 (PAD) ×2 IMPLANT
SHEATH PINNACLE 7F 10CM (SHEATH) ×4 IMPLANT
SHEATH PINNACLE 9F 10CM (SHEATH) ×2 IMPLANT
SHEATH PROBE COVER 6X72 (BAG) ×2 IMPLANT
SHEATH SWARTZ TS SL2 63CM 8.5F (SHEATH) ×2 IMPLANT
TUBING SMART ABLATE COOLFLOW (TUBING) ×2 IMPLANT

## 2021-06-30 NOTE — Transfer of Care (Signed)
Immediate Anesthesia Transfer of Care Note  Patient: [name]  Procedure(s) Performed: ATRIAL FIBRILLATION ABLATION  Patient Location: Cath Lab  Anesthesia Type:General  Level of Consciousness: awake, alert  and patient cooperative  Airway & Oxygen Therapy: Patient Spontanous Breathing and Patient connected to nasal cannula oxygen  Post-op Assessment: Report given to RN and Post -op Vital signs reviewed and stable  Post vital signs: Reviewed and stable  Last Vitals:  Vitals Value Taken Time  BP 102/52 06/30/21 1219  Temp    Pulse 79 06/30/21 1220  Resp 28 06/30/21 1220  SpO2 97 % 06/30/21 1220  Vitals shown include unvalidated device data.  Last Pain:  Vitals:   06/30/21 0917  TempSrc:   PainSc: 0-No pain         Complications: No notable events documented.

## 2021-06-30 NOTE — Anesthesia Preprocedure Evaluation (Addendum)
Anesthesia Evaluation  Patient identified by MRN, date of birth, ID band Patient awake    Reviewed: Allergy & Precautions, H&P , NPO status , Patient's Chart, lab work & pertinent test results  Airway Mallampati: III  TM Distance: >3 FB Neck ROM: Full    Dental no notable dental hx. (+) Teeth Intact, Dental Advisory Given   Pulmonary sleep apnea and Continuous Positive Airway Pressure Ventilation ,    Pulmonary exam normal breath sounds clear to auscultation       Cardiovascular + CAD, + Cardiac Stents, + Peripheral Vascular Disease and +CHF  + dysrhythmias Atrial Fibrillation + pacemaker  Rhythm:Regular Rate:Normal     Neuro/Psych Depression negative neurological ROS     GI/Hepatic negative GI ROS, Neg liver ROS,   Endo/Other  negative endocrine ROS  Renal/GU Renal disease  negative genitourinary   Musculoskeletal  (+) Arthritis , Osteoarthritis,    Abdominal   Peds  Hematology negative hematology ROS (+)   Anesthesia Other Findings   Reproductive/Obstetrics negative OB ROS                            Anesthesia Physical Anesthesia Plan  ASA: 3  Anesthesia Plan: General   Post-op Pain Management:    Induction: Intravenous  PONV Risk Score and Plan: 3 and Ondansetron and Dexamethasone  Airway Management Planned: Oral ETT  Additional Equipment:   Intra-op Plan:   Post-operative Plan: Extubation in OR  Informed Consent: I have reviewed the patients History and Physical, chart, labs and discussed the procedure including the risks, benefits and alternatives for the proposed anesthesia with the patient or authorized representative who has indicated his/her understanding and acceptance.     Dental advisory given  Plan Discussed with: CRNA  Anesthesia Plan Comments:         Anesthesia Quick Evaluation

## 2021-06-30 NOTE — Anesthesia Postprocedure Evaluation (Signed)
Anesthesia Post Note  Patient: [name]  Procedure(s) Performed: ATRIAL FIBRILLATION ABLATION     Patient location during evaluation: PACU Anesthesia Type: General Level of consciousness: awake and alert Pain management: pain level controlled Vital Signs Assessment: post-procedure vital signs reviewed and stable Respiratory status: spontaneous breathing, nonlabored ventilation and respiratory function stable Cardiovascular status: blood pressure returned to baseline and stable Postop Assessment: no apparent nausea or vomiting Anesthetic complications: no   No notable events documented.  Last Vitals:  Vitals:   06/30/21 1245 06/30/21 1250  BP: (!) 91/48 (!) 96/53  Pulse: 78 77  Resp: (!) 0 15  Temp:  (!) 36.4 C  SpO2: 98% 99%    Last Pain:  Vitals:   06/30/21 1222  TempSrc:   PainSc: 0-No pain                 Angelena Sand,W. EDMOND

## 2021-06-30 NOTE — H&P (Signed)
PCP: Biagio Borg, MD Primary Cardiologist: Dr Marisue Ivan Primary EP:  Dr Leonides Sake is a 80 y.o. male who presents today for afib ablation.  He has had afib for several years.  He has failed medical therapy with tikosyn and ranexa.  + palpitations and fatigue.   He recently fell.  He has shoulder surgery planned.   Today, he denies symptoms of palpitations, chest pain, shortness of breath,  lower extremity edema, dizziness, presyncope, or syncope.  The patient is otherwise without complaint today.        Past Medical History:  Diagnosis Date   Allergic rhinitis      takes Allegra daily   Allergic rhinitis, cause unspecified 06/22/2012   Arthritis     Atrial fibrillation ([place])      takes Tikosyn and Eliquis daily   Bilateral popliteal artery aneurysm (HCC)     Coronary artery disease      LAD stenting 2004 non-DES   Depression      took Zoloft 45yrs ago but nothing now   Diverticulosis     DVT (deep venous thrombosis) (HCC)     Dysphagia      occasionally   History of blood clots 2003    left leg prior to fem pop   History of colon polyps     Hyperlipidemia      takes Tricor and Lipitor daily   Insomnia      takes Ambien nightly as needed   Joint pain     Joint swelling     Neuropathy      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   OSA (obstructive sleep apnea)      CPAP   Pacemaker     Peripheral vascular disease ([place])     Pleurisy      early 80's   Prostatitis     Urinary urgency           Past Surgical History:  Procedure Laterality Date   CARDIAC CATHETERIZATION   2004   CARDIAC CATHETERIZATION N/A 05/06/2015    Procedure: Left Heart Cath and Coronary Angiography;  Surgeon: Belva Crome, MD;  Location: [place] [facility];  Service: Cardiovascular;  Laterality: N/A;   CARDIOVERSION N/A 12/18/2020    Procedure: CARDIOVERSION;  Surgeon: Josue Hector, MD;  Location: [facility];  Service: Cardiovascular;  Laterality: N/A;   COLONOSCOPY       hand and arm  surgery Right      as a teenager   HAND SURGERY Left     INGUINAL HERNIA REPAIR Right     IR RADIOLOGIST EVAL & MGMT   01/05/2021   JOINT REPLACEMENT Left December 09, 2013    Left Hip    JOINT REPLACEMENT Right Aug. 25, 2015    Right Hip   left knee surgery       PACEMAKER INSERTION        due to bradycardia   PERMANENT PACEMAKER GENERATOR CHANGE N/A 06/17/2012    Procedure: PERMANENT PACEMAKER GENERATOR CHANGE;  Surgeon: Deboraha Sprang, MD;  Location: [facility] CATH LAB;  Service: Cardiovascular;  Laterality: N/A;   right and left fem-pop bypass       stent (unspec)        x2   TOTAL HIP ARTHROPLASTY Left 12/09/2013    Procedure: TOTAL HIP ARTHROPLASTY ANTERIOR APPROACH;  Surgeon: Hessie Dibble, MD;  Location: [place];  Service: Orthopedics;  Laterality: Left;  left anterior  total hip arthroplasty   TOTAL HIP ARTHROPLASTY Right 05/05/2014    Procedure: TOTAL HIP ARTHROPLASTY ANTERIOR APPROACH;  Surgeon: Hessie Dibble, MD;  Location: [place];  Service: Orthopedics;  Laterality: Right;   TURBINATE REDUCTION       wisdom extracted           ROS- all systems are reviewed and negative except as per HPI above         Current Outpatient Medications  Medication Sig Dispense Refill   atorvastatin (LIPITOR) 80 MG tablet TAKE 1 TABLET BY MOUTH  DAILY 90 tablet 2   CALCIUM PO Take 600 mg by mouth 2 (two) times daily.       carvedilol (COREG) 25 MG tablet TAKE 2 TABLETS BY MOUTH  TWICE DAILY 360 tablet 2   Cholecalciferol (D3 VITAMIN PO) Take 4,000 Units by mouth daily.       Coenzyme Q10 (COQ-10 PO) Take 300 mg by mouth daily.       Cyanocobalamin (VITAMIN B 12) 500 MCG TABS Take 500 mcg by mouth 3 (three) times a week.       dapagliflozin propanediol (FARXIGA) 10 MG TABS tablet Take 1 tablet (10 mg total) by mouth daily before breakfast. 90 tablet 1   dofetilide (TIKOSYN) 500 MCG capsule TAKE 1 CAPSULE BY MOUTH  TWICE DAILY (Patient taking differently: TAKE 1 CAPSULE BY MOUTH  TWICE DAILY) 180  capsule 2   ELIQUIS 5 MG TABS tablet TAKE 1 TABLET BY MOUTH  TWICE DAILY 180 tablet 2   fenofibrate 160 MG tablet TAKE 1 TABLET BY MOUTH  DAILY 90 tablet 2   fexofenadine (ALLEGRA) 180 MG tablet Take 180 mg by mouth daily.       Fluocinonide Emulsified Base 0.05 % CREA APPLY TO AFFECTED AREA(S)  TWO TIMES DAILY AS NEEDED 60 g 2   folic acid (FOLVITE) 614 MCG tablet Take 400 mcg by mouth daily.       Lysine 500 MG TABS Take 500 mg by mouth 3 (three) times daily.       Magnesium 250 MG TABS Take 250 mg by mouth 2 (two) times daily.        potassium chloride (MICRO-K) 10 MEQ CR capsule Take 10 mEq by mouth 2 (two) times daily.       ranolazine (RANEXA) 500 MG 12 hr tablet TAKE 1 TABLET BY MOUTH  TWICE DAILY 180 tablet 3   sacubitril-valsartan (ENTRESTO) 24-26 MG Take 1 tablet by mouth 2 (two) times daily. 180 tablet 3   vancomycin (VANCOCIN) 125 MG capsule Take 1 capsule (125 mg total) by mouth every 6 (six) hours for 14 days, THEN 1 capsule (125 mg total) every 12 (twelve) hours for 7 days, THEN 1 capsule (125 mg total) daily for 7 days, THEN 1 capsule (125 mg total) every other day for 14 days. 84 capsule 0    No current facility-administered medications for this visit.      Physical Exam: Vitals:   06/30/21 0847  BP: 102/63  Pulse: (!) 116  Resp: 12  Temp: 97.8 F (36.6 C)  SpO2: 98%      GEN- The patient is well appearing, alert and oriented x 3 today.   Head- normocephalic, atraumatic Eyes-  Sclera clear, conjunctiva pink Ears- hearing intact Oropharynx- clear Lungs- Clear to ausculation bilaterally, normal work of breathing Chest- pacemaker pocket is well healed Heart- Regular rate and rhythm, no murmurs, rubs or gallops, PMI not laterally displaced GI- soft, NT,  ND, + BS Extremities- no clubbing, cyanosis, or edema      Assessment and Plan:   1. Paroxysmal atrial fibrillation The patient has symptomatic, recurrent  atrial fibrillation. he has failed medical therapy  with tikosyn. Chads2vasc score is 4.  he is anticoagulated with eliquis .   Risk, benefits, and alternatives to EP study and radiofrequency ablation for afib were again discussed in detail today. These risks include but are not limited to stroke, bleeding, vascular damage, tamponade, perforation, damage to the esophagus, lungs, and other structures, pulmonary vein stenosis, worsening renal function, and death. The patient understands these risk and wishes to proceed.    Cardiac CT reviewed at length with the patient today.  I will order high resolution CT as per Dr Weber Cooks.  Pt is aware of concerns for fibrotic lung changes.  he reports compliance with [place] without interruption.  Thompson Grayer MD, [facility] 06/30/2021 10:00 AM

## 2021-06-30 NOTE — Discharge Instructions (Signed)

## 2021-06-30 NOTE — Telephone Encounter (Signed)
-----   Message from Thompson Grayer, MD sent at 06/30/2021  9:58 AM EDT ----- Fibrotic lung changes are noted.  Otila Kluver, could you help arrange high resolution chest CT as requested by Dr Weber Cooks in 3-4 weeks?  We will consider pulmonary referral pending results.

## 2021-06-30 NOTE — Anesthesia Procedure Notes (Signed)
Procedure Name: Intubation Date/Time: 06/30/2021 10:28 AM Performed by: Janene Harvey, CRNA Pre-anesthesia Checklist: Patient identified, Emergency Drugs available, Suction available and Patient being monitored Patient Re-evaluated:Patient Re-evaluated prior to induction Oxygen Delivery Method: Circle system utilized Preoxygenation: Pre-oxygenation with 100% oxygen Induction Type: IV induction Ventilation: Mask ventilation without difficulty and Oral airway inserted - appropriate to patient size Laryngoscope Size: Mac and 4 Grade View: Grade II Tube type: Oral Tube size: 7.5 mm Number of attempts: 1 Airway Equipment and Method: Stylet and Oral airway Placement Confirmation: ETT inserted through vocal cords under direct vision, positive ETCO2 and breath sounds checked- equal and bilateral Secured at: 23 cm Tube secured with: Tape Dental Injury: Teeth and Oropharynx as per pre-operative assessment

## 2021-06-30 NOTE — Addendum Note (Signed)
Addended by: [name] Jewel on: 06/30/2021 10:24 AM   Modules accepted: Orders

## 2021-07-01 ENCOUNTER — Encounter (HOSPITAL_COMMUNITY): Payer: Self-pay | Admitting: Internal Medicine

## 2021-07-04 DIAGNOSIS — M25512 Pain in left shoulder: Secondary | ICD-10-CM | POA: Diagnosis not present

## 2021-07-04 DIAGNOSIS — Z9889 Other specified postprocedural states: Secondary | ICD-10-CM | POA: Diagnosis not present

## 2021-07-11 ENCOUNTER — Other Ambulatory Visit: Payer: Self-pay

## 2021-07-11 ENCOUNTER — Ambulatory Visit (AMBULATORY_SURGERY_CENTER): Payer: Medicare Other | Admitting: *Deleted

## 2021-07-11 ENCOUNTER — Encounter: Payer: Self-pay | Admitting: Gastroenterology

## 2021-07-11 VITALS — Ht 75.0 in | Wt 198.3 lb

## 2021-07-11 DIAGNOSIS — M25611 Stiffness of right shoulder, not elsewhere classified: Secondary | ICD-10-CM | POA: Diagnosis not present

## 2021-07-11 DIAGNOSIS — R531 Weakness: Secondary | ICD-10-CM | POA: Diagnosis not present

## 2021-07-11 DIAGNOSIS — Z8719 Personal history of other diseases of the digestive system: Secondary | ICD-10-CM

## 2021-07-11 DIAGNOSIS — M25511 Pain in right shoulder: Secondary | ICD-10-CM | POA: Diagnosis not present

## 2021-07-11 DIAGNOSIS — Z9889 Other specified postprocedural states: Secondary | ICD-10-CM | POA: Diagnosis not present

## 2021-07-11 MED ORDER — PEG 3350-KCL-NA BICARB-NACL 420 G PO SOLR: 4000.0000 mL | Freq: Once | ORAL | 0 refills | Status: AC

## 2021-07-11 NOTE — Progress Notes (Signed)
Pt isn't taking Wilder Glade for diabetes  Pt's previsit is done over the phone and all paperwork (prep instructions, blank consent form to just read over) sent to patient and MyChart.  Pt's name and DOB verified at the beginning of the previsit.  Pt denies any difficulty with ambulating.    No trouble with anesthesia, denies being told they were difficult to intubate, or hx/fam hx of malignant hyperthermia per pt

## 2021-07-13 DIAGNOSIS — M25611 Stiffness of right shoulder, not elsewhere classified: Secondary | ICD-10-CM | POA: Diagnosis not present

## 2021-07-13 DIAGNOSIS — M25511 Pain in right shoulder: Secondary | ICD-10-CM | POA: Diagnosis not present

## 2021-07-13 DIAGNOSIS — Z9889 Other specified postprocedural states: Secondary | ICD-10-CM | POA: Diagnosis not present

## 2021-07-13 DIAGNOSIS — R531 Weakness: Secondary | ICD-10-CM | POA: Diagnosis not present

## 2021-07-18 ENCOUNTER — Other Ambulatory Visit: Payer: Self-pay

## 2021-07-18 DIAGNOSIS — M25611 Stiffness of right shoulder, not elsewhere classified: Secondary | ICD-10-CM | POA: Diagnosis not present

## 2021-07-18 DIAGNOSIS — M25511 Pain in right shoulder: Secondary | ICD-10-CM | POA: Diagnosis not present

## 2021-07-18 DIAGNOSIS — R531 Weakness: Secondary | ICD-10-CM | POA: Diagnosis not present

## 2021-07-18 DIAGNOSIS — Z9889 Other specified postprocedural states: Secondary | ICD-10-CM | POA: Diagnosis not present

## 2021-07-18 DIAGNOSIS — I739 Peripheral vascular disease, unspecified: Secondary | ICD-10-CM

## 2021-07-20 ENCOUNTER — Ambulatory Visit (AMBULATORY_SURGERY_CENTER): Payer: Medicare Other | Admitting: Gastroenterology

## 2021-07-20 ENCOUNTER — Encounter: Payer: Self-pay | Admitting: Gastroenterology

## 2021-07-20 VITALS — BP 123/65 | HR 72 | Temp 97.7°F | Resp 16 | Ht 75.0 in | Wt 198.3 lb

## 2021-07-20 DIAGNOSIS — Z8719 Personal history of other diseases of the digestive system: Secondary | ICD-10-CM | POA: Diagnosis not present

## 2021-07-20 DIAGNOSIS — K635 Polyp of colon: Secondary | ICD-10-CM | POA: Diagnosis not present

## 2021-07-20 DIAGNOSIS — K5730 Diverticulosis of large intestine without perforation or abscess without bleeding: Secondary | ICD-10-CM

## 2021-07-20 DIAGNOSIS — I10 Essential (primary) hypertension: Secondary | ICD-10-CM | POA: Diagnosis not present

## 2021-07-20 DIAGNOSIS — D123 Benign neoplasm of transverse colon: Secondary | ICD-10-CM

## 2021-07-20 DIAGNOSIS — N183 Chronic kidney disease, stage 3 unspecified: Secondary | ICD-10-CM | POA: Diagnosis not present

## 2021-07-20 DIAGNOSIS — I2510 Atherosclerotic heart disease of native coronary artery without angina pectoris: Secondary | ICD-10-CM | POA: Diagnosis not present

## 2021-07-20 MED ORDER — SODIUM CHLORIDE 0.9 % IV SOLN: 500.0000 mL | Freq: Once | INTRAVENOUS | Status: DC

## 2021-07-20 NOTE — Progress Notes (Signed)
Called to room to assist during endoscopic procedure.  Patient ID and intended procedure confirmed with present staff. Received instructions for my participation in the procedure from the performing physician.  

## 2021-07-20 NOTE — Op Note (Signed)
[place] Patient Name: [name] Procedure Date: 07/20/2021 1:36 PM MRN: [id number] Endoscopist: Milus Banister , MD Age: 80 Referring MD:  Date of Birth: [birth year] Gender: Male Account #: [id number] Procedure:                Colonoscopy Indications:              High risk colon cancer surveillance: Personal                            history of colonic polyps: 1. Precancerous colon                            polyps; Colonoscopy July 2016 Dr. Ardis Hughs , done                            for routine screening found to small polyps one was                            a tubular adenoma. Also diverticulosis. He was                            recommended to have repeat colonoscopy 5 years 2.                            Complicated left-sided colonic diverticulitis                            April, May 2022 requiring IV antibiotics, drain                            placement, PICC line.                           3. Diverticulitis treatment with antibiotics led to                            C. difficile infection confirmed by toxin positive                            diarrhea testing. Tended to manage his vancomycin                            dosing on his own Medicines:                Monitored Anesthesia Care Procedure:                Pre-Anesthesia Assessment:                           - Prior to the procedure, a History and Physical                            was performed, and patient medications and  allergies were reviewed. The patient's tolerance of                            previous anesthesia was also reviewed. The risks                            and benefits of the procedure and the sedation                            options and risks were discussed with the patient.                            All questions were answered, and informed consent                            was obtained. Prior Anticoagulants: The patient has                             taken Eliquis (apixaban), last dose was 2 days                            prior to procedure. ASA Grade Assessment: II - A                            patient with mild systemic disease. After reviewing                            the risks and benefits, the patient was deemed in                            satisfactory condition to undergo the procedure.                           After obtaining informed consent, the colonoscope                            was passed under direct vision. Throughout the                            procedure, the patient's blood pressure, pulse, and                            oxygen saturations were monitored continuously. The                            CF [id number] #[id number] was introduced through the anus                            and advanced to the the cecum, identified by                            appendiceal orifice and ileocecal valve. The  colonoscopy was performed without difficulty. The                            patient tolerated the procedure well. The quality                            of the bowel preparation was good. The ileocecal                            valve, appendiceal orifice, and rectum were                            photographed. Scope In: 1:39:04 PM Scope Out: 1:50:05 PM Scope Withdrawal Time: 0 hours 8 minutes 1 second  Total Procedure Duration: 0 hours 11 minutes 1 second  Findings:                 A 3 mm polyp was found in the transverse colon. The                            polyp was sessile. The polyp was removed with a                            cold snare. Resection and retrieval were complete.                           Multiple small and large-mouthed diverticula were                            found in the left colon.                           External and internal hemorrhoids were found. The                            hemorrhoids were small.                           The exam was otherwise  without abnormality on                            direct and retroflexion views. Complications:            No immediate complications. Estimated blood loss:                            None. Estimated Blood Loss:     Estimated blood loss: none. Impression:               - One 3 mm polyp in the transverse colon, removed                            with a cold snare. Resected and retrieved.                           - Diverticulosis in the  left colon.                           - External and internal hemorrhoids.                           - The examination was otherwise normal on direct                            and retroflexion views. Recommendation:           - Patient has a contact number available for                            emergencies. The signs and symptoms of potential                            delayed complications were discussed with the                            patient. Return to normal activities tomorrow.                            Written discharge instructions were provided to the                            patient.                           - Resume previous diet.                           - Continue present medications. You can resume your                            blood thinner today.                           - Await pathology results.                           - Consider referral to [place] to consider                            segmental colon resection given your recent severe                            diverticulitis. Milus Banister, MD 07/20/2021 1:54:20 PM This report has been signed electronically.

## 2021-07-20 NOTE — Progress Notes (Signed)
Review of pertinent gastrointestinal problems: 1.  Precancerous colon polyps;  Colonoscopy July 2016 Dr. Ardis Hughs , done for routine screening found to small polyps one was a tubular adenoma. Also diverticulosis. He was recommended to have repeat colonoscopy 5 years 2.  Complicated left-sided colonic diverticulitis April, May 2022 requiring IV antibiotics, drain placement, PICC line. 3.  Diverticulitis treatment with antibiotics led to C. difficile infection confirmed by toxin positive diarrhea testing.  Tended to manage his vancomycin dosing on his own     HPI: This is a man with recent severe diverticultiis, ph of polyps   ROS: complete GI ROS as described in HPI, all other review negative.  Constitutional:  No unintentional weight loss   Past Medical History:  Diagnosis Date   Allergic rhinitis    takes Allegra daily   Allergic rhinitis, cause unspecified 06/22/2012   Allergy    Arthritis    Atrial fibrillation ([place])    takes Tikosyn and Eliquis daily   Bilateral popliteal artery aneurysm (HCC)    Chronic kidney disease (CKD), stage III (moderate) (HCC)    Clotting disorder ([place])    Coronary artery disease    LAD stenting 2004 non-DES   Depression    took Zoloft 63yrs ago but nothing now   Diverticulosis    DVT (deep venous thrombosis) (HCC)    left popliteal artery greater than 10 years ago   Dysphagia    occasionally   History of blood clots 2003   left leg prior to fem pop   History of colon polyps    Hyperlipidemia    takes Tricor and Lipitor daily   Insomnia    takes Ambien nightly as needed   Joint pain    Joint swelling    Neuromuscular disorder (HCC)    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   OSA (obstructive sleep apnea)    CPAP   Pacemaker    Peripheral vascular disease ([place])    Pleurisy    early 80's   Prostatitis    Sleep apnea    Urinary urgency     Past Surgical History:  Procedure Laterality Date   ATRIAL FIBRILLATION  ABLATION N/A 06/30/2021   Procedure: ATRIAL FIBRILLATION ABLATION;  Surgeon: Thompson Grayer, MD;  Location: [facility];  Service: Cardiovascular;  Laterality: N/A;   CARDIAC CATHETERIZATION  09/11/2002   with 2 stents   CARDIAC CATHETERIZATION N/A 05/06/2015   Procedure: Left Heart Cath and Coronary Angiography;  Surgeon: Belva Crome, MD;  Location: [facility];  Service: Cardiovascular;  Laterality: N/A;   CARDIOVERSION N/A 12/18/2020   Procedure: CARDIOVERSION;  Surgeon: Josue Hector, MD;  Location: [facility];  Service: Cardiovascular;  Laterality: N/A;   COLONOSCOPY     hand and arm surgery Right    as a teenager   HAND SURGERY Left    INGUINAL HERNIA REPAIR Right    IR RADIOLOGIST EVAL & MGMT  01/05/2021   JOINT REPLACEMENT Left 12/09/2013   Left Hip    JOINT REPLACEMENT Right 05/05/2014   Right Hip   left knee surgery     PACEMAKER INSERTION  09/25/1996   due to bradycardia   PERMANENT PACEMAKER GENERATOR CHANGE N/A 06/17/2012   Procedure: PERMANENT PACEMAKER GENERATOR CHANGE;  Surgeon: Deboraha Sprang, MD;  Location: [facility];  Service: Cardiovascular;  Laterality: N/A;   right and left fem-pop bypass     SHOULDER ARTHROSCOPY WITH ROTATOR CUFF REPAIR  Right 05/31/2021   Procedure: RIGHT SHOULDER ARTHROSCOPY ACROMIOPLASTY AND DEBRIDEMENT;  Surgeon: Melrose Nakayama, MD;  Location: [facility];  Service: Orthopedics;  Laterality: Right;   TOTAL HIP ARTHROPLASTY Left 12/09/2013   Procedure: TOTAL HIP ARTHROPLASTY ANTERIOR APPROACH;  Surgeon: Hessie Dibble, MD;  Location: [facility];  Service: Orthopedics;  Laterality: Left;  left anterior total hip arthroplasty   TOTAL HIP ARTHROPLASTY Right 05/05/2014   Procedure: TOTAL HIP ARTHROPLASTY ANTERIOR APPROACH;  Surgeon: Hessie Dibble, MD;  Location: [facility];  Service: Orthopedics;  Laterality: Right;   TURBINATE REDUCTION     wisdom extracted       Current Outpatient Medications  Medication Sig Dispense Refill    acetaminophen (TYLENOL) 500 MG tablet Take 500 mg by mouth every 6 (six) hours as needed (pain/headaches).     atorvastatin (LIPITOR) 80 MG tablet TAKE 1 TABLET BY MOUTH  DAILY (Patient taking differently: Take 20 mg by mouth in the morning, at noon, in the evening, and at bedtime.) 90 tablet 2   CALCIUM PO Take 600 mg by mouth 2 (two) times daily.     carvedilol (COREG) 25 MG tablet TAKE 2 TABLETS BY MOUTH  TWICE DAILY (Patient taking differently: Take 25 mg by mouth in the morning, at noon, in the evening, and at bedtime.) 360 tablet 2   cholecalciferol (VITAMIN D) 25 MCG (1000 UNIT) tablet Take 1,000 Units by mouth in the morning, at noon, in the evening, and at bedtime.     Coenzyme Q10 300 MG CAPS Take 300 mg by mouth in the morning.     Cyanocobalamin (VITAMIN B 12) 500 MCG TABS Take 500 mcg by mouth every Monday, Wednesday, and Friday.     dapagliflozin propanediol (FARXIGA) 10 MG TABS tablet Take 1 tablet (10 mg total) by mouth daily before breakfast. 90 tablet 1   dofetilide (TIKOSYN) 500 MCG capsule TAKE 1 CAPSULE BY MOUTH  TWICE DAILY (Patient taking differently: TAKE 1 CAPSULE BY MOUTH  TWICE DAILY) 180 capsule 2   ELIQUIS 5 MG TABS tablet TAKE 1 TABLET BY MOUTH  TWICE DAILY (Patient taking differently: Take 2.5 mg by mouth in the morning, at noon, in the evening, and at bedtime. Taking 1 tablet in the am and pm) 180 tablet 2   fenofibrate 160 MG tablet TAKE 1 TABLET BY MOUTH  DAILY (Patient taking differently: Take 80 mg by mouth 2 (two) times daily.) 90 tablet 2   fexofenadine (ALLEGRA) 180 MG tablet Take 90 mg by mouth 2 (two) times daily.     Fluocinonide Emulsified Base 0.05 % CREA APPLY TO AFFECTED AREA(S)  TWO TIMES DAILY AS NEEDED (Patient not taking: Reported on 03/10/1600) 60 g 2   folic acid (FOLVITE) 093 MCG tablet Take 400 mcg by mouth in the morning.     Lysine 500 MG TABS Take 500 mg by mouth 3 (three) times daily.     Magnesium 250 MG TABS Take 125 mg by mouth 2 (two) times  daily.     pantoprazole (PROTONIX) 40 MG tablet Take 1 tablet (40 mg total) by mouth daily. 45 tablet 0   potassium chloride (MICRO-K) 10 MEQ CR capsule Take 10 mEq by mouth 2 (two) times daily.     ranolazine (RANEXA) 500 MG 12 hr tablet TAKE 1 TABLET BY MOUTH  TWICE DAILY 180 tablet 3   sacubitril-valsartan (ENTRESTO) 24-26 MG Take 1 tablet by mouth 2 (two) times daily. (Patient taking differently: Take 0.5 tablets by mouth in  the morning, at noon, in the evening, and at bedtime.) 180 tablet 3   triamcinolone (NASACORT) 55 MCG/ACT AERO nasal inhaler Place 1 spray into the nose in the morning.     Current Facility-Administered Medications  Medication Dose Route Frequency Provider Last Rate Last Admin   0.9 %  sodium chloride infusion  500 mL Intravenous Once Milus Banister, MD        Allergies as of 07/20/2021 - Review Complete 07/11/2021  Allergen Reaction Noted   Amiodarone Other (See Comments) 01/07/2008   Niacin Other (See Comments)    Ace inhibitors Cough 01/07/2008   Mometasone furoate Other (See Comments) 02/21/2011    Family History  Problem Relation Age of Onset   Diabetes Mother    Heart disease Mother        Before age 68   Heart disease Father        Before age 25   Heart attack Father    Stroke Father    Aneurysm Father        bilat pop art aneurysms   Peripheral vascular disease Father        Popliteal Aneurysm   Diabetes Other        family hx   Sleep apnea Other        family hx   Allergic rhinitis Neg Hx    Angioedema Neg Hx    Asthma Neg Hx    Eczema Neg Hx    Immunodeficiency Neg Hx    Urticaria Neg Hx    Colon cancer Neg Hx    Esophageal cancer Neg Hx    Prostate cancer Neg Hx    Rectal cancer Neg Hx    Stomach cancer Neg Hx     Social History   Socioeconomic History   Marital status: Married    Spouse name: Not on file   Number of children: Not on file   Years of education: Not on file   Highest education level: Not on file   Occupational History   Not on file  Tobacco Use   Smoking status: Never   Smokeless tobacco: Never  Vaping Use   Vaping Use: Never used  Substance and Sexual Activity   Alcohol use: No    Alcohol/week: 0.0 standard drinks    Comment: nothing since 2000   Drug use: No   Sexual activity: Yes  Other Topics Concern   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    Social Determinants of Health   Financial Resource Strain: Low Risk    Difficulty of Paying Living Expenses: Not hard at all  Food Insecurity: No Food Insecurity   Worried About Charity fundraiser in the Last Year: Never true   [place] in the Last Year: Never true  Transportation Needs: No Transportation Needs   Lack of Transportation (Medical): No   Lack of Transportation (Non-Medical): No  Physical Activity: Sufficiently Active   Days of Exercise per Week: 7 days   Minutes of Exercise per Session: 60 min  Stress: No Stress Concern Present   Feeling of Stress : Not at all  Social Connections: Socially Integrated   Frequency of Communication with Friends and Family: More than three times a week   Frequency of Social Gatherings with Friends and Family: More than three times a week   Attends Religious Services: More than 4 times per year   Active Member of  Clubs or Organizations: Yes   Attends Music therapist: More than 4 times per year   Marital Status: Married  Human resources officer Violence: Not on file     Physical Exam: BP (!) 105/53   Temp 97.7 F (36.5 C)   Ht 6\' 3"  (1.905 m)   Wt 198 lb 4.8 oz (89.9 kg)   BMI 24.79 kg/m  Constitutional: generally well-appearing Psychiatric: alert and oriented x3 Lungs: CTA bilaterally Heart: no MCR  Assessment and plan: 80 y.o. male with recent diverticulitis, h/o polyps  Colonscoyp today  Care is appropriate for the ambulatory setting.  Owens Loffler, MD [facility] 07/20/2021,  1:14 PM

## 2021-07-20 NOTE — Progress Notes (Signed)
Report to PACU, RN, vss, BBS= Clear.  

## 2021-07-20 NOTE — Progress Notes (Signed)
Pt's states no medical or surgical changes since previsit or office visit. 

## 2021-07-20 NOTE — Patient Instructions (Addendum)
Please read handouts provided. Continue present medications. Await pathology results. Resume Eliquis today.  YOU HAD AN ENDOSCOPIC PROCEDURE TODAY AT [facility]:   Refer to the procedure report that was given to you for any specific questions about what was found during the examination.  If the procedure report does not answer your questions, please call your gastroenterologist to clarify.  If you requested that your care partner not be given the details of your procedure findings, then the procedure report has been included in a sealed envelope for you to review at your convenience later.  YOU SHOULD EXPECT: Some feelings of bloating in the abdomen. Passage of more gas than usual.  Walking can help get rid of the air that was put into your GI tract during the procedure and reduce the bloating. If you had a lower endoscopy (such as a colonoscopy or flexible sigmoidoscopy) you may notice spotting of blood in your stool or on the toilet paper. If you underwent a bowel prep for your procedure, you may not have a normal bowel movement for a few days.  Please Note:  You might notice some irritation and congestion in your nose or some drainage.  This is from the oxygen used during your procedure.  There is no need for concern and it should clear up in a day or so.  SYMPTOMS TO REPORT IMMEDIATELY:  Following lower endoscopy (colonoscopy or flexible sigmoidoscopy):  Excessive amounts of blood in the stool  Significant tenderness or worsening of abdominal pains  Swelling of the abdomen that is new, acute  Fever of 100F or higher   For urgent or emergent issues, a gastroenterologist can be reached at any hour by calling [phone]. Do not use MyChart messaging for urgent concerns.    DIET:  We do recommend a small meal at first, but then you may proceed to your regular diet.  Drink plenty of fluids but you should avoid alcoholic beverages for 24 hours.  ACTIVITY:  You should  plan to take it easy for the rest of today and you should NOT DRIVE or use heavy machinery until tomorrow (because of the sedation medicines used during the test).    FOLLOW UP: Our staff will call the number listed on your records 48-72 hours following your procedure to check on you and address any questions or concerns that you may have regarding the information given to you following your procedure. If we do not reach you, we will leave a message.  We will attempt to reach you two times.  During this call, we will ask if you have developed any symptoms of COVID 19. If you develop any symptoms (ie: fever, flu-like symptoms, shortness of breath, cough etc.) before then, please call [phone].  If you test positive for Covid 19 in the 2 weeks post procedure, please call and report this information to [place].    If any biopsies were taken you will be contacted by phone or by letter within the next 1-3 weeks.  Please call us at [phone] if you have not heard about the biopsies in 3 weeks.    SIGNATURES/CONFIDENTIALITY: You and/or your care partner have signed paperwork which will be entered into your electronic medical record.  These signatures attest to the fact that that the information above on your After Visit Summary has been reviewed and is understood.  Full responsibility of the confidentiality of this discharge information lies with you and/or your care-partner.

## 2021-07-21 DIAGNOSIS — Z9889 Other specified postprocedural states: Secondary | ICD-10-CM | POA: Diagnosis not present

## 2021-07-21 DIAGNOSIS — R531 Weakness: Secondary | ICD-10-CM | POA: Diagnosis not present

## 2021-07-21 DIAGNOSIS — M25611 Stiffness of right shoulder, not elsewhere classified: Secondary | ICD-10-CM | POA: Diagnosis not present

## 2021-07-21 DIAGNOSIS — M25511 Pain in right shoulder: Secondary | ICD-10-CM | POA: Diagnosis not present

## 2021-07-22 ENCOUNTER — Telehealth: Payer: Self-pay

## 2021-07-22 NOTE — Telephone Encounter (Signed)
  Follow up Call-  Call back number 07/20/2021  Post procedure Call Back phone  # [phone]  Permission to leave phone message Yes  Some recent data might be hidden     Patient questions:  Do you have a fever, pain , or abdominal swelling? No. Pain Score  0 *  Have you tolerated food without any problems? Yes.    Have you been able to return to your normal activities? Yes.    Do you have any questions about your discharge instructions: Diet   No. Medications  No. Follow up visit  No.  Do you have questions or concerns about your Care? No.  Actions: * If pain score is 4 or above: No action needed, pain <4.

## 2021-07-22 NOTE — Telephone Encounter (Signed)
Left message

## 2021-07-26 DIAGNOSIS — Z9889 Other specified postprocedural states: Secondary | ICD-10-CM | POA: Diagnosis not present

## 2021-07-26 DIAGNOSIS — M25511 Pain in right shoulder: Secondary | ICD-10-CM | POA: Diagnosis not present

## 2021-07-26 DIAGNOSIS — R531 Weakness: Secondary | ICD-10-CM | POA: Diagnosis not present

## 2021-07-26 DIAGNOSIS — M25611 Stiffness of right shoulder, not elsewhere classified: Secondary | ICD-10-CM | POA: Diagnosis not present

## 2021-07-27 ENCOUNTER — Encounter: Payer: Self-pay | Admitting: Gastroenterology

## 2021-07-28 DIAGNOSIS — R531 Weakness: Secondary | ICD-10-CM | POA: Diagnosis not present

## 2021-07-28 DIAGNOSIS — M25611 Stiffness of right shoulder, not elsewhere classified: Secondary | ICD-10-CM | POA: Diagnosis not present

## 2021-07-28 DIAGNOSIS — Z9889 Other specified postprocedural states: Secondary | ICD-10-CM | POA: Diagnosis not present

## 2021-07-28 DIAGNOSIS — M25511 Pain in right shoulder: Secondary | ICD-10-CM | POA: Diagnosis not present

## 2021-08-01 ENCOUNTER — Ambulatory Visit (HOSPITAL_COMMUNITY)
Admission: RE | Admit: 2021-08-01 | Discharge: 2021-08-01 | Disposition: A | Discharge status: Home / Self Care | Payer: Medicare Other | Source: Ambulatory Visit | Attending: Physician Assistant | Admitting: Physician Assistant

## 2021-08-01 ENCOUNTER — Encounter (HOSPITAL_COMMUNITY): Payer: Self-pay | Admitting: Physician Assistant

## 2021-08-01 ENCOUNTER — Encounter (HOSPITAL_COMMUNITY): Payer: Self-pay

## 2021-08-01 VITALS — BP 110/60 | HR 69 | Ht 75.0 in | Wt 205.6 lb

## 2021-08-01 DIAGNOSIS — Z7901 Long term (current) use of anticoagulants: Secondary | ICD-10-CM | POA: Insufficient documentation

## 2021-08-01 DIAGNOSIS — D6869 Other thrombophilia: Secondary | ICD-10-CM | POA: Insufficient documentation

## 2021-08-01 DIAGNOSIS — E785 Hyperlipidemia, unspecified: Secondary | ICD-10-CM | POA: Diagnosis not present

## 2021-08-01 DIAGNOSIS — R002 Palpitations: Secondary | ICD-10-CM | POA: Diagnosis not present

## 2021-08-01 DIAGNOSIS — I5022 Chronic systolic (congestive) heart failure: Secondary | ICD-10-CM | POA: Insufficient documentation

## 2021-08-01 DIAGNOSIS — I480 Paroxysmal atrial fibrillation: Secondary | ICD-10-CM | POA: Insufficient documentation

## 2021-08-01 DIAGNOSIS — I2510 Atherosclerotic heart disease of native coronary artery without angina pectoris: Secondary | ICD-10-CM | POA: Diagnosis not present

## 2021-08-01 DIAGNOSIS — Z79899 Other long term (current) drug therapy: Secondary | ICD-10-CM | POA: Insufficient documentation

## 2021-08-01 DIAGNOSIS — I495 Sick sinus syndrome: Secondary | ICD-10-CM | POA: Diagnosis not present

## 2021-08-01 DIAGNOSIS — G4733 Obstructive sleep apnea (adult) (pediatric): Secondary | ICD-10-CM | POA: Diagnosis not present

## 2021-08-01 DIAGNOSIS — I739 Peripheral vascular disease, unspecified: Secondary | ICD-10-CM | POA: Insufficient documentation

## 2021-08-01 NOTE — Progress Notes (Signed)
Primary Care Physician: Biagio Borg, MD Primary Cardiologist: Dr Audie Box Primary Electrophysiologist: Dr Caryl Comes Referring Physician: Dr Caswell Corwin is a 80 y.o. male with a history of sick sinus syndrome s/p PPM, CAD, PVD, OSA, HLD, chronic systolic CHF, atrial fibrillation who presents for consultation in the [facility]. Patient is on Eliquis for a CHADS2VASC score of 4. Patient underwent afib ablation with Dr Rayann Heman on 06/30/21. He reports that since the procedure, he has had a regular pulse. He denies any CP, swallowing pain, or groin issues.   Today, he denies symptoms of palpitations, chest pain, shortness of breath, orthopnea, PND, lower extremity edema, dizziness, presyncope, syncope, bleeding, or neurologic sequela. The patient is tolerating medications without difficulties and is otherwise without complaint today.    Atrial Fibrillation Risk Factors:  he does have symptoms or diagnosis of sleep apnea. he is compliant with CPAP therapy. he does not have a history of rheumatic fever. he does not have a history of alcohol use.   he has a BMI of Body mass index is 25.7 kg/m.Marland Kitchen Filed Weights   08/01/21 1445  Weight: 93.3 kg    Family History  Problem Relation Age of Onset   Diabetes Mother    Heart disease Mother        Before age 50   Heart disease Father        Before age 51   Heart attack Father    Stroke Father    Aneurysm Father        bilat pop art aneurysms   Peripheral vascular disease Father        Popliteal Aneurysm   Diabetes Other        family hx   Sleep apnea Other        family hx   Allergic rhinitis Neg Hx    Angioedema Neg Hx    Asthma Neg Hx    Eczema Neg Hx    Immunodeficiency Neg Hx    Urticaria Neg Hx    Colon cancer Neg Hx    Esophageal cancer Neg Hx    Prostate cancer Neg Hx    Rectal cancer Neg Hx    Stomach cancer Neg Hx      Atrial Fibrillation Management history:  Previous  antiarrhythmic drugs: dofetilide  Previous cardioversions: 12/18/20 Previous ablations: 06/30/21 CHADS2VASC score: 4 Anticoagulation history: Eliquis   Past Medical History:  Diagnosis Date   Allergic rhinitis    takes Allegra daily   Allergic rhinitis, cause unspecified 06/22/2012   Allergy    Arthritis    Atrial fibrillation ([place])    takes Tikosyn and Eliquis daily   Bilateral popliteal artery aneurysm (HCC)    Chronic kidney disease (CKD), stage III (moderate) (HCC)    Clotting disorder ([place])    Coronary artery disease    LAD stenting 2004 non-DES   Depression    took Zoloft 69yrs ago but nothing now   Diverticulosis    DVT (deep venous thrombosis) ([place])    left popliteal artery greater than 10 years ago   Dysphagia    occasionally   History of blood clots 2003   left leg prior to fem pop   History of colon polyps    Hyperlipidemia    takes Tricor and Lipitor daily   Insomnia    takes Ambien nightly as needed   Joint pain    Joint swelling    Neuromuscular disorder ([place])  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   OSA (obstructive sleep apnea)    CPAP   Pacemaker    Peripheral vascular disease ([place])    Pleurisy    early 80's   Prostatitis    Sleep apnea    Urinary urgency    Past Surgical History:  Procedure Laterality Date   ATRIAL FIBRILLATION ABLATION N/A 06/30/2021   Procedure: ATRIAL FIBRILLATION ABLATION;  Surgeon: Thompson Grayer, MD;  Location: [facility];  Service: Cardiovascular;  Laterality: N/A;   CARDIAC CATHETERIZATION  09/11/2002   with 2 stents   CARDIAC CATHETERIZATION N/A 05/06/2015   Procedure: Left Heart Cath and Coronary Angiography;  Surgeon: Belva Crome, MD;  Location: [facility];  Service: Cardiovascular;  Laterality: N/A;   CARDIOVERSION N/A 12/18/2020   Procedure: CARDIOVERSION;  Surgeon: Josue Hector, MD;  Location: [facility];  Service: Cardiovascular;  Laterality: N/A;   COLONOSCOPY     hand and arm  surgery Right    as a teenager   HAND SURGERY Left    INGUINAL HERNIA REPAIR Right    IR RADIOLOGIST EVAL & MGMT  01/05/2021   JOINT REPLACEMENT Left 12/09/2013   Left Hip    JOINT REPLACEMENT Right 05/05/2014   Right Hip   left knee surgery     PACEMAKER INSERTION  09/25/1996   due to bradycardia   PERMANENT PACEMAKER GENERATOR CHANGE N/A 06/17/2012   Procedure: PERMANENT PACEMAKER GENERATOR CHANGE;  Surgeon: Deboraha Sprang, MD;  Location: [facility];  Service: Cardiovascular;  Laterality: N/A;   right and left fem-pop bypass     SHOULDER ARTHROSCOPY WITH ROTATOR CUFF REPAIR Right 05/31/2021   Procedure: RIGHT SHOULDER ARTHROSCOPY ACROMIOPLASTY AND DEBRIDEMENT;  Surgeon: Melrose Nakayama, MD;  Location: [facility];  Service: Orthopedics;  Laterality: Right;   TOTAL HIP ARTHROPLASTY Left 12/09/2013   Procedure: TOTAL HIP ARTHROPLASTY ANTERIOR APPROACH;  Surgeon: Hessie Dibble, MD;  Location: [facility];  Service: Orthopedics;  Laterality: Left;  left anterior total hip arthroplasty   TOTAL HIP ARTHROPLASTY Right 05/05/2014   Procedure: TOTAL HIP ARTHROPLASTY ANTERIOR APPROACH;  Surgeon: Hessie Dibble, MD;  Location: [facility];  Service: Orthopedics;  Laterality: Right;   TURBINATE REDUCTION     wisdom extracted       Current Outpatient Medications  Medication Sig Dispense Refill   acetaminophen (TYLENOL) 500 MG tablet Take 500 mg by mouth every 6 (six) hours as needed (pain/headaches).     atorvastatin (LIPITOR) 80 MG tablet TAKE 1 TABLET BY MOUTH  DAILY 90 tablet 2   CALCIUM PO Take 600 mg by mouth 2 (two) times daily.     carvedilol (COREG) 25 MG tablet TAKE 2 TABLETS BY MOUTH  TWICE DAILY 360 tablet 2   cholecalciferol (VITAMIN D) 25 MCG (1000 UNIT) tablet Take 1,000 Units by mouth in the morning, at noon, in the evening, and at bedtime.     Coenzyme Q10 300 MG CAPS Take 300 mg by mouth in the morning.     Cyanocobalamin (VITAMIN B 12) 500 MCG TABS Take 500 mcg by mouth every Monday,  Wednesday, and Friday.     dapagliflozin propanediol (FARXIGA) 10 MG TABS tablet Take 1 tablet (10 mg total) by mouth daily before breakfast. 90 tablet 1   dofetilide (TIKOSYN) 500 MCG capsule TAKE 1 CAPSULE BY MOUTH  TWICE DAILY 180 capsule 2   ELIQUIS 5 MG TABS tablet TAKE 1 TABLET BY MOUTH  TWICE DAILY 180 tablet 2   fenofibrate 160 MG tablet TAKE 1 TABLET BY MOUTH  DAILY 90 tablet 2   fexofenadine (ALLEGRA) 180 MG tablet Take 90 mg by mouth 2 (two) times daily.     Fluocinonide Emulsified Base 0.05 % CREA APPLY TO AFFECTED AREA(S)  TWO TIMES DAILY AS NEEDED 60 g 2   folic acid (FOLVITE) 235 MCG tablet Take 400 mcg by mouth in the morning.     Lysine 500 MG TABS Take 500 mg by mouth 3 (three) times daily.     Magnesium 250 MG TABS Take 125 mg by mouth 2 (two) times daily.     pantoprazole (PROTONIX) 40 MG tablet Take 1 tablet (40 mg total) by mouth daily. 45 tablet 0   potassium chloride (MICRO-K) 10 MEQ CR capsule Take 10 mEq by mouth 2 (two) times daily.     ranolazine (RANEXA) 500 MG 12 hr tablet TAKE 1 TABLET BY MOUTH  TWICE DAILY 180 tablet 3   sacubitril-valsartan (ENTRESTO) 24-26 MG Take 1 tablet by mouth 2 (two) times daily. 180 tablet 3   triamcinolone (NASACORT) 55 MCG/ACT AERO nasal inhaler Place 1 spray into the nose in the morning.     No current facility-administered medications for this encounter.    Allergies  Allergen Reactions   Amiodarone Other (See Comments)    Peripheral neuropathy resulted   Niacin Other (See Comments)    Caused an irregular heartbeat   Ace Inhibitors Cough   Mometasone Furoate Other (See Comments)    (Nasonex) Causes nasal bleeding and nose bleeds    Social History   Socioeconomic History   Marital status: Married    Spouse name: Not on file   Number of children: Not on file   Years of education: Not on file   Highest education level: Not on file  Occupational History   Not on file  Tobacco Use   Smoking status: Never   Smokeless  tobacco: Never  Vaping Use   Vaping Use: Never used  Substance and Sexual Activity   Alcohol use: No    Alcohol/week: 0.0 standard drinks    Comment: nothing since 2000   Drug use: No   Sexual activity: Yes  Other Topics Concern   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    Social Determinants of Health   Financial Resource Strain: Low Risk    Difficulty of Paying Living Expenses: Not hard at all  Food Insecurity: No Food Insecurity   Worried About Charity fundraiser in the Last Year: Never true   [place] in the Last Year: Never true  Transportation Needs: No Transportation Needs   Lack of Transportation (Medical): No   Lack of Transportation (Non-Medical): No  Physical Activity: Sufficiently Active   Days of Exercise per Week: 7 days   Minutes of Exercise per Session: 60 min  Stress: No Stress Concern Present   Feeling of Stress : Not at all  Social Connections: Socially Integrated   Frequency of Communication with Friends and Family: More than three times a week   Frequency of Social Gatherings with Friends and Family: More than three times a week   Attends Religious Services: More than 4 times per year   Active Member of [employer] or Organizations: Yes   Attends Music therapist: More than 4 times per year   Marital Status: Married  Human resources officer Violence:  Not on file     ROS- All systems are reviewed and negative except as per the HPI above.  Physical Exam: Vitals:   08/01/21 1445  BP: 110/60  Pulse: 69  Weight: 93.3 kg  Height: 6\' 3"  (1.905 m)    GEN- The patient is a well appearing elderly male, alert and oriented x 3 today.   Head- normocephalic, atraumatic Eyes-  Sclera clear, conjunctiva pink Ears- hearing intact Oropharynx- clear Neck- supple  Lungs- Clear to ausculation bilaterally, normal work of breathing Heart- Regular rate and rhythm, no murmurs, rubs or gallops  GI- soft,  NT, ND, + BS Extremities- no clubbing, cyanosis, or edema MS- no significant deformity or atrophy Skin- no rash or lesion Psych- euthymic mood, full affect Neuro- strength and sensation are intact  Wt Readings from Last 3 Encounters:  08/01/21 93.3 kg  07/20/21 89.9 kg  07/11/21 89.9 kg    EKG today demonstrates  A paced rhythm, LBBB Vent. rate 69 BPM PR interval 206 ms QRS duration 124 ms QT/QTcB 430/460 ms  Echo 11/24/20 demonstrated   1. Left ventricular ejection fraction, by estimation, is 40 to 45%. The left ventricle has mildly decreased function. The left ventricle  demonstrates regional wall motion abnormalities. Abnormal (paradoxical) septal motion, consistent with left bundle  branch block. The left ventricular internal cavity size was mildly  dilated. Left ventricular diastolic parameters are consistent with Grade II diastolic dysfunction (pseudonormalization). Elevated left atrial pressure.   2. Right ventricular systolic function is normal. The right ventricular  size is moderately enlarged. There is normal pulmonary artery systolic pressure. The estimated right ventricular systolic pressure is 32.2 mmHg.   3. Left atrial size was severely dilated.   4. Right atrial size was mildly dilated.   5. The mitral valve is normal in structure. Mild mitral valve  regurgitation.   6. The aortic valve is tricuspid. Aortic valve regurgitation is not  visualized. Mild to moderate aortic valve sclerosis/calcification is  present, without any evidence of aortic stenosis.   7. The inferior vena cava is normal in size with greater than 50%  respiratory variability, suggesting right atrial pressure of 3 mmHg.   Epic records are reviewed at length today  CHA2DS2-VASc Score = 4  The patient's score is based upon: CHF History: 1 HTN History: 0 Diabetes History: 0 Stroke History: 0 Vascular Disease History: 1 Age Score: 2 Gender Score: 0       ASSESSMENT AND PLAN: 1.  Paroxysmal Atrial Fibrillation (ICD10:  I48.0) The patient's CHA2DS2-VASc score is 4, indicating a 4.8% annual risk of stroke.   S/p afib ablation 06/30/21 Patient appears to be maintaining SR. Continue Eliquis 5 mg BID with no missed doses for 3 months post ablation. Continue dofetilide 500 mcg BID. QT stable. Continue carvedilol 25 mg BID  2. Secondary Hypercoagulable State (ICD10:  D68.69) The patient is at significant risk for stroke/thromboembolism based upon his CHA2DS2-VASc Score of 4.  Continue Apixaban (Eliquis).   3. CAD No anginal symptoms. Continue Ranexa.   4. Obstructive sleep apnea The importance of adequate treatment of sleep apnea was discussed today in order to improve our ability to maintain sinus rhythm long term. Encourage compliance with CPAP therapy.  5. Chronic systolic dysfunction EF 02-54% No signs or symptoms of fluid overload today.  6. Symptomatic bradycardia S/p PPM, followed by Dr Caryl Comes and the device clinic.   Follow up with Dr Rayann Heman as scheduled. Dr Caryl Comes per recall.    Adline Peals  PA-C [facility] [address] [place], [place] [address] [phone] 08/01/2021 3:06 PM

## 2021-08-02 DIAGNOSIS — M25511 Pain in right shoulder: Secondary | ICD-10-CM | POA: Diagnosis not present

## 2021-08-02 DIAGNOSIS — M25611 Stiffness of right shoulder, not elsewhere classified: Secondary | ICD-10-CM | POA: Diagnosis not present

## 2021-08-02 DIAGNOSIS — Z9889 Other specified postprocedural states: Secondary | ICD-10-CM | POA: Diagnosis not present

## 2021-08-02 DIAGNOSIS — R531 Weakness: Secondary | ICD-10-CM | POA: Diagnosis not present

## 2021-08-03 ENCOUNTER — Encounter (HOSPITAL_COMMUNITY): Payer: Medicare Other

## 2021-08-03 ENCOUNTER — Other Ambulatory Visit (HOSPITAL_COMMUNITY): Payer: Medicare Other

## 2021-08-03 ENCOUNTER — Ambulatory Visit: Payer: Medicare Other

## 2021-08-08 ENCOUNTER — Ambulatory Visit (INDEPENDENT_AMBULATORY_CARE_PROVIDER_SITE_OTHER): Payer: Medicare Other | Admitting: Physician Assistant

## 2021-08-08 ENCOUNTER — Other Ambulatory Visit: Payer: Self-pay

## 2021-08-08 ENCOUNTER — Encounter: Payer: Self-pay | Admitting: Physician Assistant

## 2021-08-08 ENCOUNTER — Ambulatory Visit (INDEPENDENT_AMBULATORY_CARE_PROVIDER_SITE_OTHER)
Admission: RE | Admit: 2021-08-08 | Discharge: 2021-08-08 | Disposition: A | Discharge status: Home / Self Care | Payer: Medicare Other | Source: Ambulatory Visit | Attending: Surgery | Admitting: Surgery

## 2021-08-08 ENCOUNTER — Ambulatory Visit (HOSPITAL_COMMUNITY)
Admission: RE | Admit: 2021-08-08 | Discharge: 2021-08-08 | Disposition: A | Discharge status: Home / Self Care | Payer: Medicare Other | Source: Ambulatory Visit | Attending: Surgery | Admitting: Surgery

## 2021-08-08 VITALS — BP 92/63 | HR 82 | Temp 98.1°F | Resp 20 | Ht 75.0 in | Wt 200.6 lb

## 2021-08-08 DIAGNOSIS — I739 Peripheral vascular disease, unspecified: Secondary | ICD-10-CM

## 2021-08-08 NOTE — Progress Notes (Signed)
[facility] [place] HISTORY AND PHYSICAL   History of Present Illness:  Patient is a 80 y.o. year old male who presents for evaluation of PAD.   s/p right femoropopliteal arterial bypass graft 08/17/2002 and left femoropopliteal arterial bypass graft on 02/19/2002 for popliteal artery aneurysms. He had embolic shower to his left foot from the left popliteal aneurysm. He has mild tingling in his left toes, but no other residual symptoms.    He was last seen 3 years ago for surveillance.  He had palpable PT pulses B and triphasic flow on ABI's.  He denise symptoms of claudication, rest pain or non healing wounds.  He had a recent cardiac ablation procedure for A fib which is currently being managed on Eliquis, right rotator cuff injury and a bout of diverticulitis requiring hospitalization for 10 days.  He has a family history of aortic dissection/AAA.  Previous AAA scan was 2013 showing largest diameter of 2.07 cm.      He is medically managed on a Statin.     Past Medical History:  Diagnosis Date   Allergic rhinitis    takes Allegra daily   Allergic rhinitis, cause unspecified 06/22/2012   Allergy    Arthritis    Atrial fibrillation ([place])    takes Tikosyn and Eliquis daily   Bilateral popliteal artery aneurysm (HCC)    Chronic kidney disease (CKD), stage III (moderate) (HCC)    Clotting disorder (HCC)    Coronary artery disease    LAD stenting 2004 non-DES   Depression    took Zoloft 39yrs ago but nothing now   Diverticulosis    DVT (deep venous thrombosis) (HCC)    left popliteal artery greater than 10 years ago   Dysphagia    occasionally   History of blood clots 2003   left leg prior to fem pop   History of colon polyps    Hyperlipidemia    takes Tricor and Lipitor daily   Insomnia    takes Ambien nightly as needed   Joint pain    Joint swelling    Neuromuscular disorder (HCC)    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   OSA  (obstructive sleep apnea)    CPAP   Pacemaker    Peripheral vascular disease ([place])    Pleurisy    early 80's   Prostatitis    Sleep apnea    Urinary urgency     Past Surgical History:  Procedure Laterality Date   ATRIAL FIBRILLATION ABLATION N/A 06/30/2021   Procedure: ATRIAL FIBRILLATION ABLATION;  Surgeon: Thompson Grayer, MD;  Location: [facility];  Service: Cardiovascular;  Laterality: N/A;   CARDIAC CATHETERIZATION  09/11/2002   with 2 stents   CARDIAC CATHETERIZATION N/A 05/06/2015   Procedure: Left Heart Cath and Coronary Angiography;  Surgeon: Belva Crome, MD;  Location: [facility];  Service: Cardiovascular;  Laterality: N/A;   CARDIOVERSION N/A 12/18/2020   Procedure: CARDIOVERSION;  Surgeon: Josue Hector, MD;  Location: [facility] OR;  Service: Cardiovascular;  Laterality: N/A;   COLONOSCOPY     hand and arm surgery Right    as a teenager   HAND SURGERY Left    INGUINAL HERNIA REPAIR Right    IR RADIOLOGIST EVAL & MGMT  01/05/2021   JOINT REPLACEMENT Left 12/09/2013   Left Hip    JOINT REPLACEMENT Right 05/05/2014   Right Hip   left knee surgery  PACEMAKER INSERTION  09/25/1996   due to bradycardia   PERMANENT PACEMAKER GENERATOR CHANGE N/A 06/17/2012   Procedure: PERMANENT PACEMAKER GENERATOR CHANGE;  Surgeon: Deboraha Sprang, MD;  Location: [facility] CATH LAB;  Service: Cardiovascular;  Laterality: N/A;   right and left fem-pop bypass     SHOULDER ARTHROSCOPY WITH ROTATOR CUFF REPAIR Right 05/31/2021   Procedure: RIGHT SHOULDER ARTHROSCOPY ACROMIOPLASTY AND DEBRIDEMENT;  Surgeon: Melrose Nakayama, MD;  Location: [facility];  Service: Orthopedics;  Laterality: Right;   TOTAL HIP ARTHROPLASTY Left 12/09/2013   Procedure: TOTAL HIP ARTHROPLASTY ANTERIOR APPROACH;  Surgeon: Hessie Dibble, MD;  Location: [facility];  Service: Orthopedics;  Laterality: Left;  left anterior total hip arthroplasty   TOTAL HIP ARTHROPLASTY Right 05/05/2014   Procedure: TOTAL HIP  ARTHROPLASTY ANTERIOR APPROACH;  Surgeon: Hessie Dibble, MD;  Location: [facility];  Service: Orthopedics;  Laterality: Right;   TURBINATE REDUCTION     wisdom extracted       ROS:   General:  No weight loss, Fever, chills  HEENT: No recent headaches, no nasal bleeding, no visual changes, no sore throat  Neurologic: No dizziness, blackouts, seizures. No recent symptoms of stroke or mini- stroke. No recent episodes of slurred speech, or temporary blindness.  Cardiac: No recent episodes of chest pain/pressure, no shortness of breath at rest.  No shortness of breath with exertion.  Denies history of atrial fibrillation or irregular heartbeat  Vascular: No history of rest pain in feet.  No history of claudication.  No history of non-healing ulcer, No history of DVT   Pulmonary: No home oxygen, no productive cough, no hemoptysis,  No asthma or wheezing  Musculoskeletal:  [ ]  Arthritis, [ ]  Low back pain,  [ x] Joint pain  Hematologic:No history of hypercoagulable state.  No history of easy bleeding.  No history of anemia  Gastrointestinal: No hematochezia or melena,  No gastroesophageal reflux, no trouble swallowing  Urinary: [ ]  chronic Kidney disease, [ ]  on HD - [ ]  MWF or [ ]  TTHS, [ ]  Burning with urination, [ ]  Frequent urination, [ ]  Difficulty urinating;   Skin: No rashes  Psychological: No history of anxiety,  No history of depression  Social History Social History   Tobacco Use   Smoking status: Never   Smokeless tobacco: Never  Vaping Use   Vaping Use: Never used  Substance Use Topics   Alcohol use: No    Alcohol/week: 0.0 standard drinks    Comment: nothing since 2000   Drug use: No    Family History Family History  Problem Relation Age of Onset   Diabetes Mother    Heart disease Mother        Before age 16   Heart disease Father        Before age 40   Heart attack Father    Stroke Father    Aneurysm Father        bilat pop art aneurysms   Peripheral  vascular disease Father        Popliteal Aneurysm   Diabetes Other        family hx   Sleep apnea Other        family hx   Allergic rhinitis Neg Hx    Angioedema Neg Hx    Asthma Neg Hx    Eczema Neg Hx    Immunodeficiency Neg Hx    Urticaria Neg Hx    Colon cancer Neg Hx    Esophageal  cancer Neg Hx    Prostate cancer Neg Hx    Rectal cancer Neg Hx    Stomach cancer Neg Hx     Allergies  Allergies  Allergen Reactions   Amiodarone Other (See Comments)    Peripheral neuropathy resulted   Niacin Other (See Comments)    Caused an irregular heartbeat   Ace Inhibitors Cough   Mometasone Furoate Other (See Comments)    (Nasonex) Causes nasal bleeding and nose bleeds     Current Outpatient Medications  Medication Sig Dispense Refill   acetaminophen (TYLENOL) 500 MG tablet Take 500 mg by mouth every 6 (six) hours as needed (pain/headaches).     atorvastatin (LIPITOR) 80 MG tablet TAKE 1 TABLET BY MOUTH  DAILY 90 tablet 2   CALCIUM PO Take 600 mg by mouth 2 (two) times daily.     carvedilol (COREG) 25 MG tablet TAKE 2 TABLETS BY MOUTH  TWICE DAILY 360 tablet 2   cholecalciferol (VITAMIN D) 25 MCG (1000 UNIT) tablet Take 1,000 Units by mouth in the morning, at noon, in the evening, and at bedtime.     Coenzyme Q10 300 MG CAPS Take 300 mg by mouth in the morning.     Cyanocobalamin (VITAMIN B 12) 500 MCG TABS Take 500 mcg by mouth daily.     dapagliflozin propanediol (FARXIGA) 10 MG TABS tablet Take 1 tablet (10 mg total) by mouth daily before breakfast. 90 tablet 1   dofetilide (TIKOSYN) 500 MCG capsule TAKE 1 CAPSULE BY MOUTH  TWICE DAILY 180 capsule 2   ELIQUIS 5 MG TABS tablet TAKE 1 TABLET BY MOUTH  TWICE DAILY 180 tablet 2   fenofibrate 160 MG tablet TAKE 1 TABLET BY MOUTH  DAILY 90 tablet 2   fexofenadine (ALLEGRA) 180 MG tablet Take 90 mg by mouth 2 (two) times daily.     Fluocinonide Emulsified Base 0.05 % CREA APPLY TO AFFECTED AREA(S)  TWO TIMES DAILY AS NEEDED 60 g 2    folic acid (FOLVITE) 859 MCG tablet Take 400 mcg by mouth in the morning.     Lysine 500 MG TABS Take 500 mg by mouth 3 (three) times daily.     Magnesium 250 MG TABS Take 125 mg by mouth 2 (two) times daily.     potassium chloride (MICRO-K) 10 MEQ CR capsule Take 10 mEq by mouth 2 (two) times daily.     ranolazine (RANEXA) 500 MG 12 hr tablet TAKE 1 TABLET BY MOUTH  TWICE DAILY 180 tablet 3   sacubitril-valsartan (ENTRESTO) 24-26 MG Take 1 tablet by mouth 2 (two) times daily. 180 tablet 3   triamcinolone (NASACORT) 55 MCG/ACT AERO nasal inhaler Place 1 spray into the nose in the morning.     No current facility-administered medications for this visit.    Physical Examination  Vitals:   08/08/21 1400  BP: 92/63  Pulse: 82  Resp: 20  Temp: 98.1 F (36.7 C)  TempSrc: Temporal  SpO2: 98%  Weight: 200 lb 9.6 oz (91 kg)  Height: 6\' 3"  (1.905 m)    Body mass index is 25.07 kg/m.  General:  Alert and oriented, no acute distress HEENT: Normal Neck: No bruit or JVD Pulmonary: Clear to auscultation bilaterally Cardiac: Regular Rate and Rhythm without murmur Abdomen: Soft, non-tender, non-distended, no mass, no scars Skin: No rash Extremity Pulses:  2+ radial, brachial, femoral, dorsalis pedis, posterior tibial pulses bilaterally Musculoskeletal: No deformity or edema  Neurologic: Upper and lower extremity motor 5/5  and symmetric  DATA:       Right Graft #1: Femoropopliteal  +------------------+--------+--------+---------+--------+                    PSV cm/sStenosisWaveform Comments  +------------------+--------+--------+---------+--------+  Inflow            60              triphasic          +------------------+--------+--------+---------+--------+  Prox Anastomosis  55              triphasic          +------------------+--------+--------+---------+--------+  Proximal Graft    43              biphasic            +------------------+--------+--------+---------+--------+  Mid Graft         50              triphasic          +------------------+--------+--------+---------+--------+  Distal Graft      51              triphasic          +------------------+--------+--------+---------+--------+  Distal Anastomosis61              biphasic           +------------------+--------+--------+---------+--------+  Outflow           60              biphasic           +------------------+--------+--------+---------+--------+       Left Graft #1: Femoropopliteal  +--------------------+--------+--------+---------+--------+                      PSV cm/sStenosisWaveform Comments  +--------------------+--------+--------+---------+--------+  Inflow              98              biphasic           +--------------------+--------+--------+---------+--------+  Proximal Anastomosis103             triphasic          +--------------------+--------+--------+---------+--------+  Proximal Graft      85              triphasic          +--------------------+--------+--------+---------+--------+  Mid Graft           69              triphasic          +--------------------+--------+--------+---------+--------+  Distal Graft        65              triphasic          +--------------------+--------+--------+---------+--------+  Distal Anastomosis  51              triphasic          +--------------------+--------+--------+---------+--------+  Outflow             57              triphasic          +--------------------+--------+--------+---------+--------+    Summary:  Right: Patent femoropopliteal bypass graft without evidence of stenosis.  No flow identified in the popliteal aneurysm.   Left: Patent femoropopliteal bypass graft without evidence of stenosis. No  flow identified in the popliteal aneurysm.   ASSESSMENT:  History  of PAD s/p  right  femoropopliteal arterial bypass graft 08/17/2002 and left femoropopliteal arterial bypass graft on 02/19/2002 for popliteal artery aneurysms. Both using saphenous vein due to popliteal aneurysms.  He had embolic shower to his left foot from the left popliteal aneurysm.  He remains asymptomatic of rest pain, claudication and non healing wounds.  His studies reveal no stenosis in the B bypasses.  He has evidence of calcification in the LE's based off of his ABI's.  He maintains palpable pedal pulses.  PLAN: Continue medical management, exercise, and healthy diet.  F/U in 1 year for AAA ultrasound due to family history and repeat ABI's for B bypasses.   Roxy Horseman PA-C [facility] [place] [place] Office: [phone]  MD in clinic [place]

## 2021-08-09 ENCOUNTER — Other Ambulatory Visit: Payer: Self-pay | Admitting: Internal Medicine

## 2021-08-09 ENCOUNTER — Other Ambulatory Visit: Payer: Self-pay | Admitting: Student

## 2021-08-09 DIAGNOSIS — I4891 Unspecified atrial fibrillation: Secondary | ICD-10-CM

## 2021-08-09 DIAGNOSIS — Z9889 Other specified postprocedural states: Secondary | ICD-10-CM | POA: Diagnosis not present

## 2021-08-09 DIAGNOSIS — M25511 Pain in right shoulder: Secondary | ICD-10-CM | POA: Diagnosis not present

## 2021-08-09 DIAGNOSIS — R531 Weakness: Secondary | ICD-10-CM | POA: Diagnosis not present

## 2021-08-09 DIAGNOSIS — M25611 Stiffness of right shoulder, not elsewhere classified: Secondary | ICD-10-CM | POA: Diagnosis not present

## 2021-08-09 NOTE — Telephone Encounter (Signed)
Pt last saw Clint Fenton, PA on 08/01/21, last labs 06/06/21 Creat 1.18, age 80, weight 91kg, based on specified criteria pt is on appropriate dosage of Eliquis 5mg  BID for afib.  Will refill rx.

## 2021-08-10 MED ORDER — DAPAGLIFLOZIN PROPANEDIOL 10 MG PO TABS: 10.0000 mg | ORAL_TABLET | Freq: Every day | ORAL | 2 refills | Status: DC

## 2021-08-11 ENCOUNTER — Other Ambulatory Visit: Payer: Self-pay

## 2021-08-11 ENCOUNTER — Ambulatory Visit (INDEPENDENT_AMBULATORY_CARE_PROVIDER_SITE_OTHER)
Admission: RE | Admit: 2021-08-11 | Discharge: 2021-08-11 | Disposition: A | Discharge status: Home / Self Care | Payer: Medicare Other | Source: Ambulatory Visit | Attending: Internal Medicine | Admitting: Internal Medicine

## 2021-08-11 DIAGNOSIS — J8410 Pulmonary fibrosis, unspecified: Secondary | ICD-10-CM

## 2021-08-11 DIAGNOSIS — J479 Bronchiectasis, uncomplicated: Secondary | ICD-10-CM | POA: Diagnosis not present

## 2021-08-11 DIAGNOSIS — R531 Weakness: Secondary | ICD-10-CM | POA: Diagnosis not present

## 2021-08-11 DIAGNOSIS — M25611 Stiffness of right shoulder, not elsewhere classified: Secondary | ICD-10-CM | POA: Diagnosis not present

## 2021-08-11 DIAGNOSIS — Z9889 Other specified postprocedural states: Secondary | ICD-10-CM | POA: Diagnosis not present

## 2021-08-11 DIAGNOSIS — M25511 Pain in right shoulder: Secondary | ICD-10-CM | POA: Diagnosis not present

## 2021-08-14 ENCOUNTER — Encounter: Payer: Self-pay | Admitting: Internal Medicine

## 2021-08-14 DIAGNOSIS — J479 Bronchiectasis, uncomplicated: Secondary | ICD-10-CM | POA: Insufficient documentation

## 2021-08-14 DIAGNOSIS — J84112 Idiopathic pulmonary fibrosis: Secondary | ICD-10-CM | POA: Insufficient documentation

## 2021-08-16 DIAGNOSIS — Z9889 Other specified postprocedural states: Secondary | ICD-10-CM | POA: Diagnosis not present

## 2021-08-16 DIAGNOSIS — M25611 Stiffness of right shoulder, not elsewhere classified: Secondary | ICD-10-CM | POA: Diagnosis not present

## 2021-08-16 DIAGNOSIS — M25511 Pain in right shoulder: Secondary | ICD-10-CM | POA: Diagnosis not present

## 2021-08-16 DIAGNOSIS — R531 Weakness: Secondary | ICD-10-CM | POA: Diagnosis not present

## 2021-08-18 DIAGNOSIS — M25611 Stiffness of right shoulder, not elsewhere classified: Secondary | ICD-10-CM | POA: Diagnosis not present

## 2021-08-18 DIAGNOSIS — Z9889 Other specified postprocedural states: Secondary | ICD-10-CM | POA: Diagnosis not present

## 2021-08-18 DIAGNOSIS — R531 Weakness: Secondary | ICD-10-CM | POA: Diagnosis not present

## 2021-08-18 DIAGNOSIS — M25511 Pain in right shoulder: Secondary | ICD-10-CM | POA: Diagnosis not present

## 2021-08-22 ENCOUNTER — Telehealth: Payer: Self-pay | Admitting: *Deleted

## 2021-08-22 DIAGNOSIS — J8410 Pulmonary fibrosis, unspecified: Secondary | ICD-10-CM

## 2021-08-22 NOTE — Telephone Encounter (Signed)
The patient has been notified of the result and verbalized understanding.  All questions (if any) were answered. [name] Jewel, RN 08/22/2021 8:38 AM    Referral placed

## 2021-08-22 NOTE — Telephone Encounter (Signed)
-----   Message from Thompson Grayer, MD sent at 08/13/2021  2:38 PM EST ----- Results reviewed.  [name], please inform pt of result. I will route to primary care also.  I would advise referral to Pulmonary team.  Dr Jenny Reichmann, do you have a preference?

## 2021-08-23 DIAGNOSIS — M25611 Stiffness of right shoulder, not elsewhere classified: Secondary | ICD-10-CM | POA: Diagnosis not present

## 2021-08-23 DIAGNOSIS — M25511 Pain in right shoulder: Secondary | ICD-10-CM | POA: Diagnosis not present

## 2021-08-23 DIAGNOSIS — Z9889 Other specified postprocedural states: Secondary | ICD-10-CM | POA: Diagnosis not present

## 2021-08-23 DIAGNOSIS — R531 Weakness: Secondary | ICD-10-CM | POA: Diagnosis not present

## 2021-08-24 ENCOUNTER — Institutional Professional Consult (permissible substitution): Payer: Medicare Other | Admitting: Internal Medicine

## 2021-08-25 DIAGNOSIS — Z9889 Other specified postprocedural states: Secondary | ICD-10-CM | POA: Diagnosis not present

## 2021-08-25 DIAGNOSIS — M25611 Stiffness of right shoulder, not elsewhere classified: Secondary | ICD-10-CM | POA: Diagnosis not present

## 2021-08-25 DIAGNOSIS — R531 Weakness: Secondary | ICD-10-CM | POA: Diagnosis not present

## 2021-08-25 DIAGNOSIS — M25511 Pain in right shoulder: Secondary | ICD-10-CM | POA: Diagnosis not present

## 2021-08-30 DIAGNOSIS — Z9889 Other specified postprocedural states: Secondary | ICD-10-CM | POA: Diagnosis not present

## 2021-08-30 DIAGNOSIS — M25511 Pain in right shoulder: Secondary | ICD-10-CM | POA: Diagnosis not present

## 2021-08-30 DIAGNOSIS — R531 Weakness: Secondary | ICD-10-CM | POA: Diagnosis not present

## 2021-08-30 DIAGNOSIS — M25611 Stiffness of right shoulder, not elsewhere classified: Secondary | ICD-10-CM | POA: Diagnosis not present

## 2021-08-31 DIAGNOSIS — K5790 Diverticulosis of intestine, part unspecified, without perforation or abscess without bleeding: Secondary | ICD-10-CM | POA: Diagnosis not present

## 2021-09-01 DIAGNOSIS — M25611 Stiffness of right shoulder, not elsewhere classified: Secondary | ICD-10-CM | POA: Diagnosis not present

## 2021-09-01 DIAGNOSIS — R531 Weakness: Secondary | ICD-10-CM | POA: Diagnosis not present

## 2021-09-01 DIAGNOSIS — Z9889 Other specified postprocedural states: Secondary | ICD-10-CM | POA: Diagnosis not present

## 2021-09-01 DIAGNOSIS — M25511 Pain in right shoulder: Secondary | ICD-10-CM | POA: Diagnosis not present

## 2021-09-06 DIAGNOSIS — R531 Weakness: Secondary | ICD-10-CM | POA: Diagnosis not present

## 2021-09-06 DIAGNOSIS — M25511 Pain in right shoulder: Secondary | ICD-10-CM | POA: Diagnosis not present

## 2021-09-06 DIAGNOSIS — Z9889 Other specified postprocedural states: Secondary | ICD-10-CM | POA: Diagnosis not present

## 2021-09-06 DIAGNOSIS — M25611 Stiffness of right shoulder, not elsewhere classified: Secondary | ICD-10-CM | POA: Diagnosis not present

## 2021-09-07 ENCOUNTER — Other Ambulatory Visit: Payer: Self-pay

## 2021-09-07 ENCOUNTER — Encounter: Payer: Self-pay | Admitting: Pulmonary Disease

## 2021-09-07 ENCOUNTER — Ambulatory Visit (INDEPENDENT_AMBULATORY_CARE_PROVIDER_SITE_OTHER): Payer: Medicare Other | Admitting: Pulmonary Disease

## 2021-09-07 VITALS — BP 112/70 | HR 77 | Ht 75.0 in | Wt 205.0 lb

## 2021-09-07 DIAGNOSIS — J849 Interstitial pulmonary disease, unspecified: Secondary | ICD-10-CM

## 2021-09-07 DIAGNOSIS — J479 Bronchiectasis, uncomplicated: Secondary | ICD-10-CM

## 2021-09-07 MED ORDER — ALBUTEROL SULFATE (2.5 MG/3ML) 0.083% IN NEBU: 2.5000 mg | INHALATION_SOLUTION | Freq: Four times a day (QID) | RESPIRATORY_TRACT | 12 refills | Status: DC | PRN

## 2021-09-07 NOTE — Progress Notes (Signed)
Synopsis: Referred in December 2022 for ILD by Thompson Grayer, MD  Subjective:   PATIENT ID: [name] GENDER: male DOB: [birth year], MRN: [id number]  HPI  Chief Complaint  Patient presents with   Consult    Referred by PCP for possible ILD. Had a CT on 08/12/21 that showed scarring. Denies any breathing concerns.    [name] is an 80 year old male, never smoker with atrial fibrillation, CKD III, CAD, OSA, DVT and PVD who is referred to pulmonary clinic for interstitial lung disease.   Patient had CT cardiac scan on 06/23/2021 fibrotic changes at the bilateral lung bases and it was recommended he have a high-resolution CT chest scan for further evaluation.  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  He was also noted to have mild central cylindrical bronchiectasis and a 4 mm subpleural posterior left lower lobe nodule.  He denies any shortness of breath or limitations to his daily activities.  He denies any wheezing.  He does report a congested cough over the past 34 years.  The cough does not wake him up at night.  He feels like the cough is coming from deep in his chest and will produce whitish phlegm occasionally.  He reports he developed a cough since an episode of pleurisy in 1988.  He is followed by ENT for perforated septum due to cauterization procedures for epistaxis in the past.  He is using Nasacort nasal spray daily.  He does have postnasal drainage.  He also reports dry nasal/sinus passages.  He has a history of obstructive sleep apnea and using his CPAP machine that is nearly 81 years old.  He does not use humidification.  He does not follow with a sleep medicine physician.  He does report occasional GERD symptoms.  He has reduced his caffeine intake to reduce the symptoms.  He is a never smoker but does report significant secondhand smoke exposure in childhood.  He is on her [place]  family homes, a single-family home [place].  He denies any significant dust exposures over the years as he has mainly been on the distal side.  He denies any skin rashes or joint aches.  Past Medical History:  Diagnosis Date   Allergic rhinitis    takes Allegra daily   Allergic rhinitis, cause unspecified 06/22/2012   Allergy    Arthritis    Atrial fibrillation ([place])    takes Tikosyn and Eliquis daily   Bilateral popliteal artery aneurysm (HCC)    Chronic kidney disease (CKD), stage III (moderate) (HCC)    Clotting disorder ([place])    Coronary artery disease    LAD stenting 2004 non-DES   Depression    took Zoloft 47yrs ago but nothing now   Diverticulosis    DVT (deep venous thrombosis) (HCC)    left popliteal artery greater than 10 years ago   Dysphagia    occasionally   History of blood clots 2003   left leg prior to fem pop   History of colon polyps    Hyperlipidemia    takes Tricor and Lipitor daily   Insomnia    takes Ambien nightly as needed   Joint pain    Joint swelling    Neuromuscular disorder (HCC)    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   OSA (obstructive sleep apnea)    CPAP   Pacemaker  Peripheral vascular disease ([place])    Pleurisy    early 80's   Prostatitis    Sleep apnea    Urinary urgency      Family History  Problem Relation Age of Onset   Diabetes Mother    Heart disease Mother        Before age 81   Heart disease Father        Before age 60   Heart attack Father    Stroke Father    Aneurysm Father        bilat pop art aneurysms   Peripheral vascular disease Father        Popliteal Aneurysm   Diabetes Other        family hx   Sleep apnea Other        family hx   Allergic rhinitis Neg Hx    Angioedema Neg Hx    Asthma Neg Hx    Eczema Neg Hx    Immunodeficiency Neg Hx    Urticaria Neg Hx    Colon cancer Neg Hx    Esophageal cancer Neg Hx    Prostate cancer Neg Hx    Rectal cancer Neg Hx     Stomach cancer Neg Hx      Social History   Socioeconomic History   Marital status: Married    Spouse name: Not on file   Number of children: Not on file   Years of education: Not on file   Highest education level: Not on file  Occupational History   Not on file  Tobacco Use   Smoking status: Never   Smokeless tobacco: Never  Vaping Use   Vaping Use: Never used  Substance and Sexual Activity   Alcohol use: No    Alcohol/week: 0.0 standard drinks    Comment: nothing since 2000   Drug use: No   Sexual activity: Yes  Other Topics Concern   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    Social Determinants of Health   Financial Resource Strain: Low Risk    Difficulty of Paying Living Expenses: Not hard at all  Food Insecurity: No Food Insecurity   Worried About Charity fundraiser in the Last Year: Never true   [place] in the Last Year: Never true  Transportation Needs: No Transportation Needs   Lack of Transportation (Medical): No   Lack of Transportation (Non-Medical): No  Physical Activity: Sufficiently Active   Days of Exercise per Week: 7 days   Minutes of Exercise per Session: 60 min  Stress: No Stress Concern Present   Feeling of Stress : Not at all  Social Connections: Socially Integrated   Frequency of Communication with Friends and Family: More than three times a week   Frequency of Social Gatherings with Friends and Family: More than three times a week   Attends Religious Services: More than 4 times per year   Active Member of [employer] or Organizations: Yes   Attends Music therapist: More than 4 times per year   Marital Status: Married  Human resources officer Violence: Not on file     Allergies  Allergen Reactions   Amiodarone Other (See Comments)    Peripheral neuropathy resulted   Niacin Other (See Comments)    Caused an irregular heartbeat   Ace Inhibitors Cough   Mometasone Furoate Other  (See Comments)    (Nasonex) Causes nasal bleeding and  nose bleeds     Outpatient Medications Prior to Visit  Medication Sig Dispense Refill   acetaminophen (TYLENOL) 500 MG tablet Take 500 mg by mouth every 6 (six) hours as needed (pain/headaches).     apixaban (ELIQUIS) 5 MG TABS tablet TAKE 1 TABLET BY MOUTH  TWICE DAILY 180 tablet 1   atorvastatin (LIPITOR) 80 MG tablet TAKE 1 TABLET BY MOUTH  DAILY 90 tablet 3   CALCIUM PO Take 600 mg by mouth 2 (two) times daily.     carvedilol (COREG) 25 MG tablet TAKE 2 TABLETS BY MOUTH  TWICE DAILY 360 tablet 3   cholecalciferol (VITAMIN D) 25 MCG (1000 UNIT) tablet Take 1,000 Units by mouth in the morning, at noon, in the evening, and at bedtime.     Coenzyme Q10 300 MG CAPS Take 300 mg by mouth in the morning.     Cyanocobalamin (VITAMIN B 12) 500 MCG TABS Take 500 mcg by mouth daily.     dapagliflozin propanediol (FARXIGA) 10 MG TABS tablet Take 1 tablet (10 mg total) by mouth daily before breakfast. 90 tablet 2   dofetilide (TIKOSYN) 500 MCG capsule TAKE 1 CAPSULE BY MOUTH  TWICE DAILY 180 capsule 3   fenofibrate 160 MG tablet TAKE 1 TABLET BY MOUTH  DAILY 90 tablet 3   fexofenadine (ALLEGRA) 180 MG tablet Take 90 mg by mouth 2 (two) times daily.     Fluocinonide Emulsified Base 0.05 % CREA APPLY TO AFFECTED AREA(S)  TWO TIMES DAILY AS NEEDED 60 g 2   folic acid (FOLVITE) 250 MCG tablet Take 400 mcg by mouth in the morning.     Lysine 500 MG TABS Take 500 mg by mouth 3 (three) times daily.     Magnesium 250 MG TABS Take 125 mg by mouth 2 (two) times daily.     potassium chloride (KLOR-CON M) 10 MEQ tablet TAKE 1 TABLET BY MOUTH  TWICE DAILY 180 tablet 3   potassium chloride (MICRO-K) 10 MEQ CR capsule Take 10 mEq by mouth 2 (two) times daily.     ranolazine (RANEXA) 500 MG 12 hr tablet TAKE 1 TABLET BY MOUTH  TWICE DAILY 180 tablet 3   sacubitril-valsartan (ENTRESTO) 24-26 MG Take 1 tablet by mouth 2 (two) times daily. 180 tablet 3    triamcinolone (NASACORT) 55 MCG/ACT AERO nasal inhaler Place 1 spray into the nose in the morning.     No facility-administered medications prior to visit.   Review of Systems  Constitutional:  Negative for chills, fever, malaise/fatigue and weight loss.  HENT:  Positive for congestion. Negative for sinus pain and sore throat.   Eyes: Negative.   Respiratory:  Positive for cough. Negative for hemoptysis, sputum production, shortness of breath and wheezing.   Cardiovascular:  Negative for chest pain, palpitations, orthopnea, claudication and leg swelling.  Gastrointestinal:  Negative for abdominal pain, heartburn, nausea and vomiting.  Genitourinary: Negative.   Musculoskeletal:  Negative for joint pain and myalgias.  Skin:  Negative for rash.  Neurological:  Negative for weakness.  Endo/Heme/Allergies: Negative.   Psychiatric/Behavioral: Negative.     Objective:   Vitals:   09/07/21 1420  BP: 112/70  Pulse: 77  SpO2: 98%  Weight: 205 lb (93 kg)  Height: 6\' 3"  (1.905 m)    Physical Exam Constitutional:      General: He is not in acute distress. HENT:     Head: Normocephalic and atraumatic.  Eyes:     Extraocular Movements: Extraocular movements  intact.     Conjunctiva/sclera: Conjunctivae normal.     Pupils: Pupils are equal, round, and reactive to light.  Cardiovascular:     Rate and Rhythm: Normal rate and regular rhythm.     Pulses: Normal pulses.     Heart sounds: Normal heart sounds. No murmur heard. Pulmonary:     Effort: Pulmonary effort is normal.     Breath sounds: Normal breath sounds.  Abdominal:     General: Bowel sounds are normal.     Palpations: Abdomen is soft.  Musculoskeletal:     Right lower leg: No edema.     Left lower leg: No edema.  Lymphadenopathy:     Cervical: No cervical adenopathy.  Skin:    General: Skin is warm and dry.  Neurological:     General: No focal deficit present.     Mental Status: He is alert.  Psychiatric:        Mood  and Affect: Mood normal.        Behavior: Behavior normal.        Thought Content: Thought content normal.        Judgment: Judgment normal.   CBC    Component Value Date/Time   WBC 5.5 06/06/2021 1042   WBC 4.3 05/10/2021 1424   RBC 4.32 06/06/2021 1042   RBC 4.15 (L) 05/10/2021 1424   HGB 14.0 06/06/2021 1042   HCT 41.9 06/06/2021 1042   PLT 156 06/06/2021 1042   MCV 97 06/06/2021 1042   MCH 32.4 06/06/2021 1042   MCH 33.5 01/16/2021 1745   MCHC 33.4 06/06/2021 1042   MCHC 33.2 05/10/2021 1424   RDW 12.5 06/06/2021 1042   LYMPHSABS 1.2 06/06/2021 1042   MONOABS 0.4 05/10/2021 1424   EOSABS 0.2 06/06/2021 1042   BASOSABS 0.0 06/06/2021 1042   BMP Latest Ref Rng & Units 06/06/2021 05/10/2021 02/17/2021  Glucose 70 - 99 mg/dL 86 108(H) 94  BUN 8 - 27 mg/dL 17 19 17   Creatinine 0.76 - 1.27 mg/dL 1.18 1.13 1.15  BUN/Creat Ratio 10 - 24 14 - 15  Sodium 134 - 144 mmol/L 141 141 146(H)  Potassium 3.5 - 5.2 mmol/L 4.7 4.1 4.7  Chloride 96 - 106 mmol/L 105 107 109(H)  CO2 20 - 29 mmol/L 24 28 25   Calcium 8.6 - 10.2 mg/dL 9.7 9.5 9.5   Chest imaging: HRCT Chest 08/11/21 1. Pulmonary parenchymal pattern of fibrosis may be due to usual interstitial pneumonitis or nonspecific interstitial pneumonitis. Findings are categorized as probable UIP per consensus guidelines: Diagnosis of Idiopathic Pulmonary Fibrosis: An Official ATS/ERS/JRS/ALAT Clinical Practice Guideline. [place], Iss 5, [phone], May 12 2017. 2. Small pericardial effusion. 3. Mild central cylindrical bronchiectasis. 4. 4 mm subpleural posterior left lower lobe nodule. No follow-up needed if patient is low-risk. Non-contrast chest CT can be considered in 12 months if patient is high-risk. This recommendation follows the consensus statement: Guidelines for Management of Incidental Pulmonary Nodules Detected on CT Images: From the Fleischner Society 2017; Radiology 2017; 284:228-243. 5.  Cirrhosis. 6. Aortic atherosclerosis (ICD10-I70.0). Coronary artery calcification.  PFT: No flowsheet data found.  Labs:  Path:  Echo 11/2020: LV EF 40-45%. Abnormal septal motion, consistent with left bundle branch block. Grade II diastolic dysfunction. RV systolic pressure is normal. RV size is moderately enlarged. LA severely dilated. RA mildly dilated.   Heart Catheterization:  Assessment & Plan:   Bronchiectasis without complication ([place]) - Plan: Ambulatory Referral for DME,  Pulmonary function test  ILD (interstitial lung disease) ([place]) - Plan: Ambulatory Referral for DME, Pulmonary function test  Discussion: [name] is an 80 year old male, never smoker with atrial fibrillation, CKD III, CAD, OSA, DVT and PVD who is referred to pulmonary clinic for interstitial lung disease.   Based on recent high-resolution CT chest scan he has probable UIP pattern concerning for idiopathic pulmonary fibrosis.  Patient is not symptomatic at this time from a respiratory standpoint so we will continue to monitor his respiratory status at this time.  We will check pulmonary function tests at follow-up in 2 months.  For his sinus congestion and postnasal drainage he is to try ipratropium nasal spray 2 sprays per nostril twice daily as needed.  I have instructed him to use Ayr saline gel applied to each nostril at nighttime given his dry nasal passages.  For his mild central bronchiectasis noted on CT imaging we will order him a nebulizer machine and albuterol solution along with flutter valve therapy to see if this helps alleviate his history of cough.  He is to follow-up with a sleep specialist regarding his obstructive sleep apnea.  Follow-up with PFTs in 2 months.  65 minutes was spent in total on this visit which included direct patient care, reviewing patient records and completing patient's plan and documentation.  Freda Jackson, MD [facility] Office:  [phone]   Current Outpatient Medications:    acetaminophen (TYLENOL) 500 MG tablet, Take 500 mg by mouth every 6 (six) hours as needed (pain/headaches)., Disp: , Rfl:    albuterol (PROVENTIL) (2.5 MG/3ML) 0.083% nebulizer solution, Take 3 mLs (2.5 mg total) by nebulization every 6 (six) hours as needed for wheezing or shortness of breath., Disp: 75 mL, Rfl: 12   apixaban (ELIQUIS) 5 MG TABS tablet, TAKE 1 TABLET BY MOUTH  TWICE DAILY, Disp: 180 tablet, Rfl: 1   atorvastatin (LIPITOR) 80 MG tablet, TAKE 1 TABLET BY MOUTH  DAILY, Disp: 90 tablet, Rfl: 3   CALCIUM PO, Take 600 mg by mouth 2 (two) times daily., Disp: , Rfl:    carvedilol (COREG) 25 MG tablet, TAKE 2 TABLETS BY MOUTH  TWICE DAILY, Disp: 360 tablet, Rfl: 3   cholecalciferol (VITAMIN D) 25 MCG (1000 UNIT) tablet, Take 1,000 Units by mouth in the morning, at noon, in the evening, and at bedtime., Disp: , Rfl:    Coenzyme Q10 300 MG CAPS, Take 300 mg by mouth in the morning., Disp: , Rfl:    Cyanocobalamin (VITAMIN B 12) 500 MCG TABS, Take 500 mcg by mouth daily., Disp: , Rfl:    dapagliflozin propanediol (FARXIGA) 10 MG TABS tablet, Take 1 tablet (10 mg total) by mouth daily before breakfast., Disp: 90 tablet, Rfl: 2   dofetilide (TIKOSYN) 500 MCG capsule, TAKE 1 CAPSULE BY MOUTH  TWICE DAILY, Disp: 180 capsule, Rfl: 3   fenofibrate 160 MG tablet, TAKE 1 TABLET BY MOUTH  DAILY, Disp: 90 tablet, Rfl: 3   fexofenadine (ALLEGRA) 180 MG tablet, Take 90 mg by mouth 2 (two) times daily., Disp: , Rfl:    Fluocinonide Emulsified Base 0.05 % CREA, APPLY TO AFFECTED AREA(S)  TWO TIMES DAILY AS NEEDED, Disp: 60 g, Rfl: 2   folic acid (FOLVITE) 270 MCG tablet, Take 400 mcg by mouth in the morning., Disp: , Rfl:    Lysine 500 MG TABS, Take 500 mg by mouth 3 (three) times daily., Disp: , Rfl:    Magnesium 250 MG TABS,  Take 125 mg by mouth 2 (two) times daily., Disp: , Rfl:    potassium chloride (KLOR-CON M) 10 MEQ tablet, TAKE 1 TABLET BY MOUTH   TWICE DAILY, Disp: 180 tablet, Rfl: 3   potassium chloride (MICRO-K) 10 MEQ CR capsule, Take 10 mEq by mouth 2 (two) times daily., Disp: , Rfl:    ranolazine (RANEXA) 500 MG 12 hr tablet, TAKE 1 TABLET BY MOUTH  TWICE DAILY, Disp: 180 tablet, Rfl: 3   sacubitril-valsartan (ENTRESTO) 24-26 MG, Take 1 tablet by mouth 2 (two) times daily., Disp: 180 tablet, Rfl: 3   triamcinolone (NASACORT) 55 MCG/ACT AERO nasal inhaler, Place 1 spray into the nose in the morning., Disp: , Rfl:

## 2021-09-07 NOTE — Patient Instructions (Addendum)
Try Ayr Saline Gel at night for dry nasal passages   Try ipratropium nasal spray, 2 sprays per nostril as needed for post-nasal drainage.   We will check pulmonary function tests in 2 months with follow up  We will order you a nebulizer machine and albuterol solution to use with a flutter valve for airway clearance  We will schedule you follow up with a sleep specialist

## 2021-09-08 DIAGNOSIS — M25511 Pain in right shoulder: Secondary | ICD-10-CM | POA: Diagnosis not present

## 2021-09-08 DIAGNOSIS — M25611 Stiffness of right shoulder, not elsewhere classified: Secondary | ICD-10-CM | POA: Diagnosis not present

## 2021-09-08 DIAGNOSIS — J84115 Respiratory bronchiolitis interstitial lung disease: Secondary | ICD-10-CM | POA: Diagnosis not present

## 2021-09-08 DIAGNOSIS — Z9889 Other specified postprocedural states: Secondary | ICD-10-CM | POA: Diagnosis not present

## 2021-09-08 DIAGNOSIS — R531 Weakness: Secondary | ICD-10-CM | POA: Diagnosis not present

## 2021-09-09 ENCOUNTER — Encounter: Payer: Self-pay | Admitting: Pulmonary Disease

## 2021-09-14 DIAGNOSIS — M25611 Stiffness of right shoulder, not elsewhere classified: Secondary | ICD-10-CM | POA: Diagnosis not present

## 2021-09-14 DIAGNOSIS — M25511 Pain in right shoulder: Secondary | ICD-10-CM | POA: Diagnosis not present

## 2021-09-14 DIAGNOSIS — Z9889 Other specified postprocedural states: Secondary | ICD-10-CM | POA: Diagnosis not present

## 2021-09-14 DIAGNOSIS — R531 Weakness: Secondary | ICD-10-CM | POA: Diagnosis not present

## 2021-09-19 DIAGNOSIS — R531 Weakness: Secondary | ICD-10-CM | POA: Diagnosis not present

## 2021-09-19 DIAGNOSIS — M25512 Pain in left shoulder: Secondary | ICD-10-CM | POA: Diagnosis not present

## 2021-09-19 DIAGNOSIS — M25511 Pain in right shoulder: Secondary | ICD-10-CM | POA: Diagnosis not present

## 2021-09-19 DIAGNOSIS — Z9889 Other specified postprocedural states: Secondary | ICD-10-CM | POA: Diagnosis not present

## 2021-09-19 DIAGNOSIS — M25611 Stiffness of right shoulder, not elsewhere classified: Secondary | ICD-10-CM | POA: Diagnosis not present

## 2021-09-22 DIAGNOSIS — Z9889 Other specified postprocedural states: Secondary | ICD-10-CM | POA: Diagnosis not present

## 2021-09-22 DIAGNOSIS — M25611 Stiffness of right shoulder, not elsewhere classified: Secondary | ICD-10-CM | POA: Diagnosis not present

## 2021-09-22 DIAGNOSIS — R531 Weakness: Secondary | ICD-10-CM | POA: Diagnosis not present

## 2021-09-22 DIAGNOSIS — M25511 Pain in right shoulder: Secondary | ICD-10-CM | POA: Diagnosis not present

## 2021-09-29 DIAGNOSIS — M25611 Stiffness of right shoulder, not elsewhere classified: Secondary | ICD-10-CM | POA: Diagnosis not present

## 2021-09-29 DIAGNOSIS — Z9889 Other specified postprocedural states: Secondary | ICD-10-CM | POA: Diagnosis not present

## 2021-09-29 DIAGNOSIS — M25511 Pain in right shoulder: Secondary | ICD-10-CM | POA: Diagnosis not present

## 2021-09-29 DIAGNOSIS — R531 Weakness: Secondary | ICD-10-CM | POA: Diagnosis not present

## 2021-10-03 ENCOUNTER — Ambulatory Visit: Payer: Medicare Other | Admitting: Internal Medicine

## 2021-10-12 ENCOUNTER — Encounter: Payer: Self-pay | Admitting: Internal Medicine

## 2021-10-28 ENCOUNTER — Ambulatory Visit: Payer: Medicare Other | Admitting: Internal Medicine

## 2021-10-28 ENCOUNTER — Encounter: Payer: Self-pay | Admitting: Internal Medicine

## 2021-10-28 ENCOUNTER — Other Ambulatory Visit: Payer: Self-pay

## 2021-10-28 VITALS — BP 110/60 | HR 107 | Ht 75.0 in | Wt 205.0 lb

## 2021-10-28 DIAGNOSIS — G4733 Obstructive sleep apnea (adult) (pediatric): Secondary | ICD-10-CM | POA: Diagnosis not present

## 2021-10-28 DIAGNOSIS — Z950 Presence of cardiac pacemaker: Secondary | ICD-10-CM

## 2021-10-28 DIAGNOSIS — I495 Sick sinus syndrome: Secondary | ICD-10-CM

## 2021-10-28 DIAGNOSIS — I5042 Chronic combined systolic (congestive) and diastolic (congestive) heart failure: Secondary | ICD-10-CM

## 2021-10-28 DIAGNOSIS — I480 Paroxysmal atrial fibrillation: Secondary | ICD-10-CM

## 2021-10-28 NOTE — Progress Notes (Signed)
PCP: Biagio Borg, MD Primary Cardiologist: Dr Marisue Ivan Primary EP:  Delvis Kau is a 81 y.o. male who presents today for routine electrophysiology followup.  Since his recent afib ablation, the patient reports doing very well.  he denies procedure related complications.  He continues to have ERAF but is largely asymptomatic.  Exercising without difficulty.  Today, he denies symptoms of palpitations, chest pain, shortness of breath,  lower extremity edema, dizziness, presyncope, or syncope.  The patient is otherwise without complaint today.   Past Medical History:  Diagnosis Date   Allergic rhinitis    takes Allegra daily   Allergic rhinitis, cause unspecified 06/22/2012   Allergy    Arthritis    Atrial fibrillation ([place])    takes Tikosyn and Eliquis daily   Bilateral popliteal artery aneurysm (HCC)    Chronic kidney disease (CKD), stage III (moderate) (HCC)    Clotting disorder (HCC)    Coronary artery disease    LAD stenting 2004 non-DES   Depression    took Zoloft 61yrs ago but nothing now   Diverticulosis    DVT (deep venous thrombosis) (HCC)    left popliteal artery greater than 10 years ago   Dysphagia    occasionally   History of blood clots 2003   left leg prior to fem pop   History of colon polyps    Hyperlipidemia    takes Tricor and Lipitor daily   Insomnia    takes Ambien nightly as needed   Joint pain    Joint swelling    Neuromuscular disorder (HCC)    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   OSA (obstructive sleep apnea)    CPAP   Pacemaker    Peripheral vascular disease ([place])    Pleurisy    early 80's   Prostatitis    Sleep apnea    Urinary urgency    Past Surgical History:  Procedure Laterality Date   ATRIAL FIBRILLATION ABLATION N/A 06/30/2021   Procedure: ATRIAL FIBRILLATION ABLATION;  Surgeon: Thompson Grayer, MD;  Location: [facility];  Service: Cardiovascular;  Laterality: N/A;   CARDIAC CATHETERIZATION   09/11/2002   with 2 stents   CARDIAC CATHETERIZATION N/A 05/06/2015   Procedure: Left Heart Cath and Coronary Angiography;  Surgeon: Belva Crome, MD;  Location: [facility];  Service: Cardiovascular;  Laterality: N/A;   CARDIOVERSION N/A 12/18/2020   Procedure: CARDIOVERSION;  Surgeon: Josue Hector, MD;  Location: [facility];  Service: Cardiovascular;  Laterality: N/A;   COLONOSCOPY     hand and arm surgery Right    as a teenager   HAND SURGERY Left    INGUINAL HERNIA REPAIR Right    IR RADIOLOGIST EVAL & MGMT  01/05/2021   JOINT REPLACEMENT Left 12/09/2013   Left Hip    JOINT REPLACEMENT Right 05/05/2014   Right Hip   left knee surgery     PACEMAKER INSERTION  09/25/1996   due to bradycardia   PERMANENT PACEMAKER GENERATOR CHANGE N/A 06/17/2012   Procedure: PERMANENT PACEMAKER GENERATOR CHANGE;  Surgeon: Deboraha Sprang, MD;  [facility]: [facility];  Service: Cardiovascular;  Laterality: N/A;   right and left fem-pop bypass     SHOULDER ARTHROSCOPY WITH ROTATOR CUFF REPAIR Right 05/31/2021   Procedure: RIGHT SHOULDER ARTHROSCOPY ACROMIOPLASTY AND DEBRIDEMENT;  Surgeon: Melrose Nakayama, MD;  Location: [facility];  Service: Orthopedics;  Laterality: Right;   TOTAL HIP ARTHROPLASTY Left  12/09/2013   Procedure: TOTAL HIP ARTHROPLASTY ANTERIOR APPROACH;  Surgeon: Hessie Dibble, MD;  Location: [facility];  Service: Orthopedics;  Laterality: Left;  left anterior total hip arthroplasty   TOTAL HIP ARTHROPLASTY Right 05/05/2014   Procedure: TOTAL HIP ARTHROPLASTY ANTERIOR APPROACH;  Surgeon: Hessie Dibble, MD;  Location: [facility];  Service: Orthopedics;  Laterality: Right;   TURBINATE REDUCTION     wisdom extracted       ROS- all systems are personally reviewed and negatives except as per HPI above  Current Outpatient Medications  Medication Sig Dispense Refill   acetaminophen (TYLENOL) 500 MG tablet Take 500 mg by mouth every 6 (six) hours as needed (pain/headaches).     albuterol  (PROVENTIL) (2.5 MG/3ML) 0.083% nebulizer solution Take 3 mLs (2.5 mg total) by nebulization every 6 (six) hours as needed for wheezing or shortness of breath. 75 mL 12   apixaban (ELIQUIS) 5 MG TABS tablet TAKE 1 TABLET BY MOUTH  TWICE DAILY 180 tablet 1   atorvastatin (LIPITOR) 80 MG tablet TAKE 1 TABLET BY MOUTH  DAILY 90 tablet 3   carvedilol (COREG) 25 MG tablet TAKE 2 TABLETS BY MOUTH  TWICE DAILY 360 tablet 3   cholecalciferol (VITAMIN D) 25 MCG (1000 UNIT) tablet Take 1,000 Units by mouth in the morning, at noon, in the evening, and at bedtime.     Coenzyme Q10 300 MG CAPS Take 300 mg by mouth in the morning.     Cyanocobalamin (VITAMIN B 12) 500 MCG TABS Take 500 mcg by mouth daily.     dapagliflozin propanediol (FARXIGA) 10 MG TABS tablet Take 1 tablet (10 mg total) by mouth daily before breakfast. 90 tablet 2   dofetilide (TIKOSYN) 500 MCG capsule TAKE 1 CAPSULE BY MOUTH  TWICE DAILY 180 capsule 3   fenofibrate 160 MG tablet TAKE 1 TABLET BY MOUTH  DAILY 90 tablet 3   fexofenadine (ALLEGRA) 180 MG tablet Take 90 mg by mouth 2 (two) times daily.     Fluocinonide Emulsified Base 0.05 % CREA APPLY TO AFFECTED AREA(S)  TWO TIMES DAILY AS NEEDED 60 g 2   folic acid (FOLVITE) 389 MCG tablet Take 400 mcg by mouth in the morning.     Lysine 500 MG TABS Take 500 mg by mouth 3 (three) times daily.     Magnesium 250 MG TABS Take 125 mg by mouth 2 (two) times daily.     potassium chloride (KLOR-CON M) 10 MEQ tablet TAKE 1 TABLET BY MOUTH  TWICE DAILY 180 tablet 3   ranolazine (RANEXA) 500 MG 12 hr tablet TAKE 1 TABLET BY MOUTH  TWICE DAILY 180 tablet 3   sacubitril-valsartan (ENTRESTO) 24-26 MG Take 1 tablet by mouth 2 (two) times daily. 180 tablet 3   triamcinolone (NASACORT) 55 MCG/ACT AERO nasal inhaler Place 1 spray into the nose in the morning.     No current facility-administered medications for this visit.    Physical Exam: Vitals:   10/28/21 1439  BP: 110/60  Pulse: (!) 107  SpO2:  98%  Weight: 205 lb (93 kg)  Height: 6\' 3"  (1.905 m)    GEN- The patient is well appearing, alert and oriented x 3 today.   Head- normocephalic, atraumatic Eyes-  Sclera clear, conjunctiva pink Ears- hearing intact Oropharynx- clear Lungs- Clear to ausculation bilaterally, normal work of breathing Heart- irregular rate and rhythm  GI- soft, NT, ND, + BS Extremities- no clubbing, cyanosis, or edema  EKG tracing ordered  today is personally reviewed and shows afib  Assessment and Plan:  1. Paroxysmal atrial fibrillation Doing well s/p ablation He continues to have ERAF chads2vasc score is 4.  He is on eliquis He has not tolerated amiodarone previously due to neuropathy.  By device interrogation it does appear that his AF has improved.  We will continue watchful waiting for now.  2. SSS Normal pacemaker function  3. OSA Compliance with CPAP is advised  4. Chronic systolic dysfunction EF 27-51% Repeat echo once AF has improved  Return to see me in 2 months  Thompson Grayer MD, [facility] 10/28/2021 2:49 PM

## 2021-10-28 NOTE — Patient Instructions (Addendum)
Medication Instructions:  Your physician recommends that you continue on your current medications as directed. Please refer to the Current Medication list given to you today.  Labwork: None ordered.  Testing/Procedures: None ordered.  Follow-Up: Your physician wants you to follow-up in: 2 months with Dr. Rayann Heman  December 15, 2021 at 2:00 pm at the [facility] office  Any Other Special Instructions Will Be Listed Below (If Applicable).  If you need a refill on your cardiac medications before your next appointment, please call your pharmacy.

## 2021-10-31 ENCOUNTER — Ambulatory Visit (INDEPENDENT_AMBULATORY_CARE_PROVIDER_SITE_OTHER): Payer: Medicare Other

## 2021-10-31 DIAGNOSIS — M25511 Pain in right shoulder: Secondary | ICD-10-CM | POA: Diagnosis not present

## 2021-10-31 DIAGNOSIS — Z0000 Encounter for general adult medical examination without abnormal findings: Secondary | ICD-10-CM | POA: Diagnosis not present

## 2021-10-31 NOTE — Patient Instructions (Signed)
[name] , Thank you for taking time to come for your Medicare Wellness Visit. I appreciate your ongoing commitment to your health goals. Please review the following plan we discussed and let me know if I can assist you in the future.   Screening recommendations/referrals: Colonoscopy: no longer required  Recommended yearly ophthalmology/optometry visit for glaucoma screening and checkup Recommended yearly dental visit for hygiene and checkup  Vaccinations: Influenza vaccine: completed  Pneumococcal vaccine: completed  Tdap vaccine: 06/25/2020 Shingles vaccine: completed     Advanced directives: yes   Conditions/risks identified: none   Next appointment: none   Preventive Care 82 Years and Older, Male Preventive care refers to lifestyle choices and visits with your health care provider that can promote health and wellness. What does preventive care include? A yearly physical exam. This is also called an annual well check. Dental exams once or twice a year. Routine eye exams. Ask your health care provider how often you should have your eyes checked. Personal lifestyle choices, including: Daily care of your teeth and gums. Regular physical activity. Eating a healthy diet. Avoiding tobacco and drug use. Limiting alcohol use. Practicing safe sex. Taking low doses of aspirin every day. Taking vitamin and mineral supplements as recommended by your health care provider. What happens during an annual well check? The services and screenings done by your health care provider during your annual well check will depend on your age, overall health, lifestyle risk factors, and family history of disease. Counseling  Your health care provider may ask you questions about your: Alcohol use. Tobacco use. Drug use. Emotional well-being. Home and relationship well-being. Sexual activity. Eating habits. History of falls. Memory and ability to understand (cognition). Work and work  Statistician. Screening  You may have the following tests or measurements: Height, weight, and BMI. Blood pressure. Lipid and cholesterol levels. These may be checked every 5 years, or more frequently if you are over 21 years old. Skin check. Lung cancer screening. You may have this screening every year starting at age 35 if you have a 30-pack-year history of smoking and currently smoke or have quit within the past 15 years. Fecal occult blood test (FOBT) of the stool. You may have this test every year starting at age 82. Flexible sigmoidoscopy or colonoscopy. You may have a sigmoidoscopy every 5 years or a colonoscopy every 10 years starting at age 49. Prostate cancer screening. Recommendations will vary depending on your family history and other risks. Hepatitis C blood test. Hepatitis B blood test. Sexually transmitted disease (STD) testing. Diabetes screening. This is done by checking your blood sugar (glucose) after you have not eaten for a while (fasting). You may have this done every 1-3 years. Abdominal aortic aneurysm (AAA) screening. You may need this if you are a current or former smoker. Osteoporosis. You may be screened starting at age 82 if you are at high risk. Talk with your health care provider about your test results, treatment options, and if necessary, the need for more tests. Vaccines  Your health care provider may recommend certain vaccines, such as: Influenza vaccine. This is recommended every year. Tetanus, diphtheria, and acellular pertussis (Tdap, Td) vaccine. You may need a Td booster every 10 years. Zoster vaccine. You may need this after age 82. Pneumococcal 13-valent conjugate (PCV13) vaccine. One dose is recommended after age 82. Pneumococcal polysaccharide (PPSV23) vaccine. One dose is recommended after age 82. Talk to your health care provider about which screenings and vaccines you need and how  often you need them. This information is not intended to replace  advice given to you by your health care provider. Make sure you discuss any questions you have with your health care provider. Document Released: 09/24/2015 Document Revised: 05/17/2016 Document Reviewed: 06/29/2015 Elsevier Interactive Patient Education  2017 [place] Prevention in the Home Falls can cause injuries. They can happen to people of all ages. There are many things you can do to make your home safe and to help prevent falls. What can I do on the outside of my home? Regularly fix the edges of walkways and driveways and fix any cracks. Remove anything that might make you trip as you walk through a door, such as a raised step or threshold. Trim any bushes or trees on the path to your home. Use bright outdoor lighting. Clear any walking paths of anything that might make someone trip, such as rocks or tools. Regularly check to see if handrails are loose or broken. Make sure that both sides of any steps have handrails. Any raised decks and porches should have guardrails on the edges. Have any leaves, snow, or ice cleared regularly. Use sand or salt on walking paths during winter. Clean up any spills in your garage right away. This includes oil or grease spills. What can I do in the bathroom? Use night lights. Install grab bars by the toilet and in the tub and shower. Do not use towel bars as grab bars. Use non-skid mats or decals in the tub or shower. If you need to sit down in the shower, use a plastic, non-slip stool. Keep the floor dry. Clean up any water that spills on the floor as soon as it happens. Remove soap buildup in the tub or shower regularly. Attach bath mats securely with double-sided non-slip rug tape. Do not have throw rugs and other things on the floor that can make you trip. What can I do in the bedroom? Use night lights. Make sure that you have a light by your bed that is easy to reach. Do not use any sheets or blankets that are too big for your bed.  They should not hang down onto the floor. Have a firm chair that has side arms. You can use this for support while you get dressed. Do not have throw rugs and other things on the floor that can make you trip. What can I do in the kitchen? Clean up any spills right away. Avoid walking on wet floors. Keep items that you use a lot in easy-to-reach places. If you need to reach something above you, use a strong step stool that has a grab bar. Keep electrical cords out of the way. Do not use floor polish or wax that makes floors slippery. If you must use wax, use non-skid floor wax. Do not have throw rugs and other things on the floor that can make you trip. What can I do with my stairs? Do not leave any items on the stairs. Make sure that there are handrails on both sides of the stairs and use them. Fix handrails that are broken or loose. Make sure that handrails are as long as the stairways. Check any carpeting to make sure that it is firmly attached to the stairs. Fix any carpet that is loose or worn. Avoid having throw rugs at the top or bottom of the stairs. If you do have throw rugs, attach them to the floor with carpet tape. Make sure that you have a  light switch at the top of the stairs and the bottom of the stairs. If you do not have them, ask someone to add them for you. What else can I do to help prevent falls? Wear shoes that: Do not have high heels. Have rubber bottoms. Are comfortable and fit you well. Are closed at the toe. Do not wear sandals. If you use a stepladder: Make sure that it is fully opened. Do not climb a closed stepladder. Make sure that both sides of the stepladder are locked into place. Ask someone to hold it for you, if possible. Clearly mark and make sure that you can see: Any grab bars or handrails. First and last steps. Where the edge of each step is. Use tools that help you move around (mobility aids) if they are needed. These  include: Canes. Walkers. Scooters. Crutches. Turn on the lights when you go into a dark area. Replace any light bulbs as soon as they burn out. Set up your furniture so you have a clear path. Avoid moving your furniture around. If any of your floors are uneven, fix them. If there are any pets around you, be aware of where they are. Review your medicines with your doctor. Some medicines can make you feel dizzy. This can increase your chance of falling. Ask your doctor what other things that you can do to help prevent falls. This information is not intended to replace advice given to you by your health care provider. Make sure you discuss any questions you have with your health care provider. Document Released: 06/24/2009 Document Revised: 02/03/2016 Document Reviewed: 10/02/2014 Elsevier Interactive Patient Education  2017 [employer].

## 2021-10-31 NOTE — Progress Notes (Signed)
Subjective:   [name] is a 81 y.o. male who presents for an Subsequent Medicare Annual Wellness Visit.  Review of Systems     Cardiac Risk Factors include: advanced age (>64men, >59 women);male gender;dyslipidemia;hypertension     Objective:    Today's Vitals   There is no height or weight on file to calculate BMI.  Advanced Directives 10/31/2021 06/30/2021 05/31/2021 05/25/2021 12/16/2020 12/15/2020 12/11/2020  Does Patient Have a Medical Advance Directive? Yes Yes Yes Yes No No No  Type of Paramedic of [place];Living will [place];Living will Healthcare Power of [place];Living will - - -  Does patient want to make changes to medical advance directive? - - No - Patient declined - - - -  Copy of [place] in Chart? No - copy requested - No - copy requested - - - -  Would patient like information on creating a medical advance directive? - - No - Patient declined - No - Patient declined - No - Patient declined  Pre-existing out of facility DNR order (yellow form or pink MOST form) - - - - - - -    Current Medications (verified) Outpatient Encounter Medications as of 10/31/2021  Medication Sig   acetaminophen (TYLENOL) 500 MG tablet Take 500 mg by mouth every 6 (six) hours as needed (pain/headaches).   albuterol (PROVENTIL) (2.5 MG/3ML) 0.083% nebulizer solution Take 3 mLs (2.5 mg total) by nebulization every 6 (six) hours as needed for wheezing or shortness of breath.   apixaban (ELIQUIS) 5 MG TABS tablet TAKE 1 TABLET BY MOUTH  TWICE DAILY   atorvastatin (LIPITOR) 80 MG tablet TAKE 1 TABLET BY MOUTH  DAILY   carvedilol (COREG) 25 MG tablet TAKE 2 TABLETS BY MOUTH  TWICE DAILY   Coenzyme Q10 300 MG CAPS Take 300 mg by mouth in the morning.   Cyanocobalamin (VITAMIN B 12) 500 MCG TABS Take 500 mcg by mouth daily.   dapagliflozin propanediol (FARXIGA) 10 MG TABS tablet Take 1 tablet (10 mg  total) by mouth daily before breakfast.   dofetilide (TIKOSYN) 500 MCG capsule TAKE 1 CAPSULE BY MOUTH  TWICE DAILY   fenofibrate 160 MG tablet TAKE 1 TABLET BY MOUTH  DAILY   fexofenadine (ALLEGRA) 180 MG tablet Take 90 mg by mouth 2 (two) times daily.   Fluocinonide Emulsified Base 0.05 % CREA APPLY TO AFFECTED AREA(S)  TWO TIMES DAILY AS NEEDED   folic acid (FOLVITE) 790 MCG tablet Take 400 mcg by mouth in the morning.   Lysine 500 MG TABS Take 500 mg by mouth 3 (three) times daily.   Magnesium 250 MG TABS Take 125 mg by mouth 2 (two) times daily.   potassium chloride (KLOR-CON M) 10 MEQ tablet TAKE 1 TABLET BY MOUTH  TWICE DAILY   ranolazine (RANEXA) 500 MG 12 hr tablet TAKE 1 TABLET BY MOUTH  TWICE DAILY   sacubitril-valsartan (ENTRESTO) 24-26 MG Take 1 tablet by mouth 2 (two) times daily.   triamcinolone (NASACORT) 55 MCG/ACT AERO nasal inhaler Place 1 spray into the nose in the morning.   cholecalciferol (VITAMIN D) 25 MCG (1000 UNIT) tablet Take 1,000 Units by mouth in the morning, at noon, in the evening, and at bedtime. (Patient not taking: Reported on 10/31/2021)   No facility-administered encounter medications on file as of 10/31/2021.    Allergies (verified) Amiodarone, Niacin, Ace inhibitors, and Mometasone furoate   History: Past Medical History:  Diagnosis Date   Allergic rhinitis    takes Allegra daily   Allergic rhinitis, cause unspecified 06/22/2012   Allergy    Arthritis    Atrial fibrillation ([place])    takes Tikosyn and Eliquis daily   Bilateral popliteal artery aneurysm (HCC)    Chronic kidney disease (CKD), stage III (moderate) (HCC)    Clotting disorder (HCC)    Coronary artery disease    LAD stenting 2004 non-DES   Depression    took Zoloft 3yrs ago but nothing now   Diverticulosis    DVT (deep venous thrombosis) (HCC)    left popliteal artery greater than 10 years ago   Dysphagia    occasionally   History of blood clots 2003   left leg prior to fem  pop   History of colon polyps    Hyperlipidemia    takes Tricor and Lipitor daily   Insomnia    takes Ambien nightly as needed   Joint pain    Joint swelling    Neuromuscular disorder (HCC)    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   OSA (obstructive sleep apnea)    CPAP   Pacemaker    Peripheral vascular disease ([place])    Pleurisy    early 80's   Prostatitis    Sleep apnea    Urinary urgency    Past Surgical History:  Procedure Laterality Date   ATRIAL FIBRILLATION ABLATION N/A 06/30/2021   Procedure: ATRIAL FIBRILLATION ABLATION;  Surgeon: Thompson Grayer, MD;  Location: [facility];  Service: Cardiovascular;  Laterality: N/A;   CARDIAC CATHETERIZATION  09/11/2002   with 2 stents   CARDIAC CATHETERIZATION N/A 05/06/2015   Procedure: Left Heart Cath and Coronary Angiography;  Surgeon: Belva Crome, MD;  Location: [facility];  Service: Cardiovascular;  Laterality: N/A;   CARDIOVERSION N/A 12/18/2020   Procedure: CARDIOVERSION;  Surgeon: Josue Hector, MD;  Location: [facility];  Service: Cardiovascular;  Laterality: N/A;   COLONOSCOPY     hand and arm surgery Right    as a teenager   HAND SURGERY Left    INGUINAL HERNIA REPAIR Right    IR RADIOLOGIST EVAL & MGMT  01/05/2021   JOINT REPLACEMENT Left 12/09/2013   Left Hip    JOINT REPLACEMENT Right 05/05/2014   Right Hip   left knee surgery     PACEMAKER INSERTION  09/25/1996   due to bradycardia   PERMANENT PACEMAKER GENERATOR CHANGE N/A 06/17/2012   Procedure: PERMANENT PACEMAKER GENERATOR CHANGE;  Surgeon: Deboraha Sprang, MD;  Location: [facility];  Service: Cardiovascular;  Laterality: N/A;   right and left fem-pop bypass     SHOULDER ARTHROSCOPY WITH ROTATOR CUFF REPAIR Right 05/31/2021   Procedure: RIGHT SHOULDER ARTHROSCOPY ACROMIOPLASTY AND DEBRIDEMENT;  Surgeon: Melrose Nakayama, MD;  Location: [facility];  Service: Orthopedics;  Laterality: Right;   TOTAL HIP ARTHROPLASTY Left 12/09/2013    Procedure: TOTAL HIP ARTHROPLASTY ANTERIOR APPROACH;  Surgeon: Hessie Dibble, MD;  Location: [facility];  Service: Orthopedics;  Laterality: Left;  left anterior total hip arthroplasty   TOTAL HIP ARTHROPLASTY Right 05/05/2014   Procedure: TOTAL HIP ARTHROPLASTY ANTERIOR APPROACH;  Surgeon: Hessie Dibble, MD;  Location: [facility];  Service: Orthopedics;  Laterality: Right;   TURBINATE REDUCTION     wisdom extracted      Family History  Problem Relation Age of Onset   Diabetes Mother    Heart disease Mother  Before age 44   Heart disease Father        Before age 26   Heart attack Father    Stroke Father    Aneurysm Father        bilat pop art aneurysms   Peripheral vascular disease Father        Popliteal Aneurysm   Diabetes Other        family hx   Sleep apnea Other        family hx   Allergic rhinitis Neg Hx    Angioedema Neg Hx    Asthma Neg Hx    Eczema Neg Hx    Immunodeficiency Neg Hx    Urticaria Neg Hx    Colon cancer Neg Hx    Esophageal cancer Neg Hx    Prostate cancer Neg Hx    Rectal cancer Neg Hx    Stomach cancer Neg Hx    Social History   Socioeconomic History   Marital status: Married    Spouse name: Not on file   Number of children: Not on file   Years of education: Not on file   Highest education level: Not on file  Occupational History   Not on file  Tobacco Use   Smoking status: Never    Passive exposure: Past   Smokeless tobacco: Never  Vaping Use   Vaping Use: Never used  Substance and Sexual Activity   Alcohol use: No    Alcohol/week: 0.0 standard drinks    Comment: nothing since 2000   Drug use: No   Sexual activity: Yes  Other Topics Concern   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    Social Determinants of Health   Financial Resource Strain: Low Risk    Difficulty of Paying Living Expenses: Not hard at all  Food Insecurity: No Food Insecurity   Worried About Sales executive in the Last Year: Never true   [place] in the Last Year: Never true  Transportation Needs: No Transportation Needs   Lack of Transportation (Medical): No   Lack of Transportation (Non-Medical): No  Physical Activity: Insufficiently Active   Days of Exercise per Week: 3 days   Minutes of Exercise per Session: 40 min  Stress: No Stress Concern Present   Feeling of Stress : Not at all  Social Connections: Moderately Integrated   Frequency of Communication with Friends and Family: Three times a week   Frequency of Social Gatherings with Friends and Family: Three times a week   Attends Religious Services: Never   Active Member of Clubs or Organizations: Yes   Attends Music therapist: More than 4 times per year   Marital Status: Married    Tobacco Counseling Counseling given: Not Answered   Clinical Intake:  Pre-visit preparation completed: Yes  Pain : No/denies pain     Nutritional Risks: None Diabetes: No  How often do you need to have someone help you when you read instructions, pamphlets, or other written materials from your doctor or pharmacy?: 1 - Never What is the last grade level you completed in school?: masters  Diabetic?no   Interpreter Needed?: No  Information entered by :: [facility]   Activities of Daily Living In your present state of health, do you have any difficulty performing the following activities: 10/31/2021 05/25/2021  Hearing? N N  Vision? N N  Difficulty concentrating or making decisions? N  N  Walking or climbing stairs? N N  Dressing or bathing? N N  Doing errands, shopping? N N  Preparing Food and eating ? N -  Using the Toilet? N -  In the past six months, have you accidently leaked urine? N -  Do you have problems with loss of bowel control? N -  Managing your Medications? N -  Managing your Finances? N -  Housekeeping or managing your Housekeeping? N -  Some recent data might be hidden    Patient Care  Team: Biagio Borg, MD as PCP - General (Internal Medicine) O'Neal, Cassie Freer, MD as PCP - Cardiology (Internal Medicine) Deboraha Sprang, MD as PCP - Electrophysiology (Cardiology) Deboraha Sprang, MD (Cardiology) Melrose Nakayama, MD as Consulting Physician (Orthopedic Surgery) Biagio Borg, MD as Consulting Physician (Internal Medicine) Marica Otter, [place] as Consulting Physician (Optometry)  Indicate any recent Medical Services you may have received from other than [facility] providers in the past year (date may be approximate).     Assessment:   This is a routine wellness examination for [place].  Hearing/Vision screen No results found.  Dietary issues and exercise activities discussed: Current Exercise Habits: Home exercise routine, Type of exercise: strength training/weights;walking, Time (Minutes): 40, Frequency (Times/Week): 3, Weekly Exercise (Minutes/Week): 120, Intensity: Mild, Exercise limited by: None identified   Goals Addressed             This Visit's Progress    Client understands the importance of follow-up with providers by attending scheduled visits   On track    I would like to resume exercising at [facility].       Depression Screen PHQ 2/9 Scores 10/31/2021 10/31/2021 05/10/2021 05/10/2021 10/11/2020 06/25/2020 06/25/2020  PHQ - 2 Score 0 0 0 0 0 0 0  PHQ- 9 Score - - - - - - 0    Fall Risk Fall Risk  10/31/2021 05/10/2021 05/10/2021 10/11/2020 06/25/2020  Falls in the past year? 1 1 1  0 0  Number falls in past yr: 0 0 0 0 -  Injury with Fall? 1 1 1  0 -  Comment - right shoulder rot cuff - - -  Risk for fall due to : - - - No Fall Risks -  Follow up Falls evaluation completed - - Falls evaluation completed -    FALL RISK PREVENTION PERTAINING TO THE HOME:  Any stairs in or around the home? Yes  If so, are there any without handrails? No  Home free of loose throw rugs in walkways, pet beds, electrical cords, etc? Yes  Adequate lighting in your home to reduce  risk of falls? Yes   ASSISTIVE DEVICES UTILIZED TO PREVENT FALLS:  Life alert? No  Use of a cane, walker or w/c? No  Grab bars in the bathroom? Yes  Shower chair or bench in shower? Yes  Elevated toilet seat or a handicapped toilet? No      Cognitive Function:  Normal cognitive status assessed by direct observation by this Nurse Health Advisor. No abnormalities found.        Immunizations Immunization History  Administered Date(s) Administered   Fluad Quad(high Dose 65+) 06/25/2020, 05/10/2021   H1N1 08/11/2008   Influenza Split 06/21/2012   Influenza Whole 08/20/2008, 05/31/2010, 05/04/2013   Influenza, High Dose Seasonal PF 06/15/2014, 06/29/2018, 06/15/2019   Influenza-Unspecified 05/18/2015   PFIZER(Purple Top)SARS-COV-2 Vaccination 09/21/2019, 10/11/2019, 06/08/2020, 01/05/2021   Pneumococcal Conjugate-13 04/07/2015   Pneumococcal Polysaccharide-23 09/11/2005   Td 02/02/2009  Tdap 06/25/2020   Zoster Recombinat (Shingrix) 02/02/2017, 06/01/2017   Zoster, Live 03/05/2007    TDAP status: Up to date  Flu Vaccine status: Up to date  Pneumococcal vaccine status: Up to date  Covid-19 vaccine status: Completed vaccines  Qualifies for Shingles Vaccine? Yes   Zostavax completed Yes   Shingrix Completed?: Yes  Screening Tests Health Maintenance  Topic Date Due   COVID-19 Vaccine (5 - Booster for Pfizer series) 03/02/2021   COLONOSCOPY (Pts 45-1yrs Insurance coverage will need to be confirmed)  07/20/2026   TETANUS/TDAP  06/25/2030   Pneumonia Vaccine 69+ Years old  Completed   INFLUENZA VACCINE  Completed   Zoster Vaccines- Shingrix  Completed   HPV VACCINES  Aged Out    Health Maintenance  Health Maintenance Due  Topic Date Due   COVID-19 Vaccine (5 - Booster for [place] series) 03/02/2021    Colorectal cancer screening: No longer required.   Lung Cancer Screening: (Low Dose CT Chest recommended if Age 58-80 years, 30 pack-year currently smoking OR  have quit w/in 15years.) does not qualify.   Lung Cancer Screening Referral: n/a  Additional Screening:  Hepatitis C Screening: does not qualify;   Vision Screening: Recommended annual ophthalmology exams for early detection of glaucoma and other disorders of the eye. Is the patient up to date with their annual eye exam?  Yes  Who is the provider or what is the name of the office in which the patient attends annual eye exams? Dr.Miller  If pt is not established with a provider, would they like to be referred to a provider to establish care? No .   Dental Screening: Recommended annual dental exams for proper oral hygiene  Community Resource Referral / Chronic Care Management: CRR required this visit?  No   CCM required this visit?  No      Plan:     I have personally reviewed and noted the following in the patients chart:   Medical and social history Use of alcohol, tobacco or illicit drugs  Current medications and supplements including opioid prescriptions. Patient is not currently taking opioid prescriptions. Functional ability and status Nutritional status Physical activity Advanced directives List of other physicians Hospitalizations, surgeries, and ER visits in previous 12 months Vitals Screenings to include cognitive, depression, and falls Referrals and appointments  In addition, I have reviewed and discussed with patient certain preventive protocols, quality metrics, and best practice recommendations. A written personalized care plan for preventive services as well as general preventive health recommendations were provided to patient.     Randel Pigg, LPN   [id number]   Nurse Notes: none

## 2021-11-08 ENCOUNTER — Ambulatory Visit: Payer: Medicare Other | Admitting: Pulmonary Disease

## 2021-11-26 ENCOUNTER — Encounter: Payer: Self-pay | Admitting: Internal Medicine

## 2021-11-26 DIAGNOSIS — I4891 Unspecified atrial fibrillation: Secondary | ICD-10-CM

## 2021-11-28 MED ORDER — DOFETILIDE 500 MCG PO CAPS: 500.0000 ug | ORAL_CAPSULE | Freq: Two times a day (BID) | ORAL | 3 refills | Status: DC

## 2021-11-28 MED ORDER — ENTRESTO 24-26 MG PO TABS: 1.0000 | ORAL_TABLET | Freq: Two times a day (BID) | ORAL | 3 refills | Status: DC

## 2021-12-15 ENCOUNTER — Ambulatory Visit: Payer: Medicare Other | Admitting: Internal Medicine

## 2021-12-15 ENCOUNTER — Encounter: Payer: Self-pay | Admitting: Internal Medicine

## 2021-12-15 ENCOUNTER — Encounter: Payer: Self-pay | Admitting: *Deleted

## 2021-12-15 VITALS — BP 100/60 | HR 104 | Ht 75.0 in | Wt 205.6 lb

## 2021-12-15 DIAGNOSIS — I480 Paroxysmal atrial fibrillation: Secondary | ICD-10-CM

## 2021-12-15 DIAGNOSIS — Z01818 Encounter for other preprocedural examination: Secondary | ICD-10-CM

## 2021-12-15 DIAGNOSIS — Z950 Presence of cardiac pacemaker: Secondary | ICD-10-CM | POA: Diagnosis not present

## 2021-12-15 DIAGNOSIS — I4891 Unspecified atrial fibrillation: Secondary | ICD-10-CM

## 2021-12-15 MED ORDER — METOPROLOL TARTRATE 25 MG PO TABS: 25.0000 mg | ORAL_TABLET | Freq: Once | ORAL | 0 refills | Status: DC | PRN

## 2021-12-15 NOTE — H&P (View-Only) (Signed)
? ? ?PCP: Biagio Borg, MD ?Primary Cardiologist: [facility] ?Primary EP:  Dr Caryl Comes ? ?[name] is a 81 y.o. male who presents today for routine electrophysiology followup.  Since last being seen in our clinic, the patient reports doing reasonably well.  Unfortunately, he continues to have afib.   + palpitations and fatigue. Today, he denies symptoms of chest pain, shortness of breath,  lower extremity edema, dizziness, presyncope, or syncope.  The patient is otherwise without complaint today.  ? ?Past Medical History:  ?Diagnosis Date  ? Allergic rhinitis   ? takes Allegra daily  ? Allergic rhinitis, cause unspecified 06/22/2012  ? Allergy   ? Arthritis   ? Atrial fibrillation ([place])   ? takes Tikosyn and Eliquis daily  ? 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 Chronic kidney disease (CKD), stage III (moderate) (HCC)   ? Clotting disorder ([place])   ? Coronary artery disease   ? LAD stenting 2004 non-DES  ? Depression   ? took Zoloft 56yr ago but nothing now  ? Diverticulosis   ? D[place] ([place]) ([place]   ? left popliteal artery greater than 10 years ago  ? Dysphagia   ? occasionally  ? History of blood clots 2003  ? left leg prior to fem pop  ? History of colon polyps   ? Hyperlipidemia   ? takes Tricor and Lipitor daily  ? Insomnia   ? takes Ambien nightly as needed  ? Joint pain   ? Joint swelling   ? Neuromuscular disorder ([place]   ? 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 OSA (obstructive sleep apnea)   ? CPAP  ? Pacemaker   ? Peripheral vascular disease ([place]   ? Pleurisy   ? early 856's ? Prostatitis   ? Sleep apnea   ? Urinary urgency   ? ?Past Surgical History:  ?Procedure Laterality Date  ? ATRIAL FIBRILLATION ABLATION N/A 06/30/2021  ? Procedure: ATRIAL FIBRILLATION ABLATION;  Surgeon: AThompson Grayer MD;  [facility];  Service: Cardiovascular;  Laterality: N/A;  ? CARDIAC CATHETERIZATION  09/11/2002  ? with 2 stents  ? CARDIAC CATHETERIZATION N/A  05/06/2015  ? Procedure: Left Heart Cath and Coronary Angiography;  Surgeon: HBelva Crome MD;  [facility];  Service: Cardiovascular;  Laterality: N/A;  ? CARDIOVERSION N/A 12/18/2020  ? Procedure: CARDIOVERSION;  Surgeon: NJosue Hector MD;  [facility]  Service: Cardiovascular;  Laterality: N/A;  ? COLONOSCOPY    ? hand and arm surgery Right   ? as a teenager  ? HAND SURGERY Left   ? INGUINAL HERNIA REPAIR Right   ? IR RADIOLOGIST EVAL & MGMT  01/05/2021  ? JOINT REPLACEMENT Left 12/09/2013  ? Left Hip   ? JOINT REPLACEMENT Right 05/05/2014  ? Right Hip  ? left knee surgery    ? PACEMAKER INSERTION  09/25/1996  ? due to bradycardia  ? PERMANENT PACEMAKER GENERATOR CHANGE N/A 06/17/2012  ? Procedure: PERMANENT PACEMAKER GENERATOR CHANGE;  Surgeon: SDeboraha Sprang MD;  [facility];  Service: Cardiovascular;  Laterality: N/A;  ? right and left fem-pop bypass    ? SHOULDER ARTHROSCOPY WITH ROTATOR CUFF REPAIR Right 05/31/2021  ? Procedure: RIGHT SHOULDER ARTHROSCOPY ACROMIOPLASTY AND DEBRIDEMENT;  Surgeon: DMelrose Nakayama MD;  [facility];  Service: Orthopedics;  Laterality: Right;  ? TOTAL HIP ARTHROPLASTY Left 12/09/2013  ? Procedure: TOTAL HIP  ARTHROPLASTY ANTERIOR APPROACH;  Surgeon: Hessie Dibble, MD;  Location: [place];  Service: Orthopedics;  Laterality: Left;  left anterior total hip arthroplasty  ? TOTAL HIP ARTHROPLASTY Right 05/05/2014  ? Procedure: TOTAL HIP ARTHROPLASTY ANTERIOR APPROACH;  Surgeon: Hessie Dibble, MD;  Location: [place];  Service: Orthopedics;  Laterality: Right;  ? TURBINATE REDUCTION    ? wisdom extracted     ? ? ?ROS- all systems are reviewed and negative except as per HPI above ? ?Current Outpatient Medications  ?Medication Sig Dispense Refill  ? acetaminophen (TYLENOL) 500 MG tablet Take 500 mg by mouth every 6 (six) hours as needed (pain/headaches).    ? albuterol (PROVENTIL) (2.5 MG/3ML) 0.083% nebulizer solution Take 3 mLs (2.5 mg total)  by nebulization every 6 (six) hours as needed for wheezing or shortness of breath. 75 mL 12  ? apixaban (ELIQUIS) 5 MG TABS tablet TAKE 1 TABLET BY MOUTH  TWICE DAILY 180 tablet 1  ? atorvastatin (LIPITOR) 80 MG tablet TAKE 1 TABLET BY MOUTH  DAILY 90 tablet 3  ? carvedilol (COREG) 25 MG tablet TAKE 2 TABLETS BY MOUTH  TWICE DAILY 360 tablet 3  ? cholecalciferol (VITAMIN D) 25 MCG (1000 UNIT) tablet Take 1,000 Units by mouth in the morning, at noon, in the evening, and at bedtime.    ? Coenzyme Q10 300 MG CAPS Take 300 mg by mouth in the morning.    ? Cyanocobalamin (VITAMIN B 12) 500 MCG TABS Take 500 mcg by mouth daily.    ? dapagliflozin propanediol (FARXIGA) 10 MG TABS tablet Take 1 tablet (10 mg total) by mouth daily before breakfast. 90 tablet 2  ? dofetilide (TIKOSYN) 500 MCG capsule Take 1 capsule (500 mcg total) by mouth 2 (two) times daily. 180 capsule 3  ? fenofibrate 160 MG tablet TAKE 1 TABLET BY MOUTH  DAILY 90 tablet 3  ? fexofenadine (ALLEGRA) 180 MG tablet Take 90 mg by mouth 2 (two) times daily.    ? Fluocinonide Emulsified Base 0.05 % CREA APPLY TO AFFECTED AREA(S)  TWO TIMES DAILY AS NEEDED 60 g 2  ? folic acid (FOLVITE) 774 MCG tablet Take 400 mcg by mouth in the morning.    ? Lysine 500 MG TABS Take 500 mg by mouth 3 (three) times daily.    ? Magnesium 250 MG TABS Take 125 mg by mouth 2 (two) times daily.    ? potassium chloride (KLOR-CON M) 10 MEQ tablet TAKE 1 TABLET BY MOUTH  TWICE DAILY 180 tablet 3  ? ranolazine (RANEXA) 500 MG 12 hr tablet TAKE 1 TABLET BY MOUTH  TWICE DAILY 180 tablet 3  ? sacubitril-valsartan (ENTRESTO) 24-26 MG Take 1 tablet by mouth 2 (two) times daily. 180 tablet 3  ? triamcinolone (NASACORT) 55 MCG/ACT AERO nasal inhaler Place 1 spray into the nose in the morning.    ? ?No current facility-administered medications for this visit.  ? ? ?Physical Exam: ?Vitals:  ? 12/15/21 1413  ?BP: 100/60  ?Pulse: (!) 104  ?SpO2: 96%  ?Weight: 205 lb 9.6 oz (93.3 kg)  ?Height: '6\' 3"'$   (1.905 m)  ? ? ?GEN- The patient is well appearing, alert and oriented x 3 today.   ?Head- normocephalic, atraumatic ?Eyes-  Sclera clear, conjunctiva pink ?Ears- hearing intact ?Oropharynx- clear ?Lungs-  normal work of breathing ?Chest- pacemaker pocket is well healed ?Heart- iRRR ?GI- soft  ?Extremities- no clubbing, cyanosis, or edema ? ?Pacemaker interrogation- reviewed in detail today,  See  PACEART report ? ?ekg tracing ordered today is personally reviewed and shows afib, iLBBB ? ?Assessment and Plan: ? ?1. Symptomatic sinus bradycardia  ?Normal pacemaker function ?See Claudia Desanctis Art report ?No changes today ?he is not device dependant today ? ?2. Paroxysmal atrial fibrillation ?Afib burden is 11% by PPM interrogation ?Chads2vasc score is 4.  He is on eliquis ?He has not tolerated amiodarone previously due to neuropathy.  He has failed medical therapy with Phyllis Ginger also ? ?Therapeutic strategies for afib including medicine and ablation were discussed in detail with the patient today. Risk, benefits, and alternatives to repeat EP study and radiofrequency ablation for afib were also discussed in detail today. These risks include but are not limited to stroke, bleeding, vascular damage, tamponade, perforation, damage to the esophagus, lungs, and other structures, pulmonary vein stenosis, worsening renal function, and death. The patient understands these risk and wishes to proceed.  We will therefore proceed with catheter ablation at the next available time.  Carto, ICE, anesthesia are requested for the procedure.  Will also obtain cardiac CT prior to the procedure to exclude LAA thrombus and further evaluate atrial anatomy. ? ? ?3. Chronic systolic dysfunction ?EF 40-45% ?Repeat echo once AF is controlled ? ?4. OSA ?Compliance with CPAP advised ? ?Risks, benefits and potential toxicities for medications prescribed and/or refilled reviewed with patient today.  ? ?Thompson Grayer MD, FACC ?12/15/2021 ?2:30 PM ? ? ?

## 2021-12-15 NOTE — Progress Notes (Signed)
? ? ?PCP: Biagio Borg, MD ?Primary Cardiologist: [facility] ?Primary EP:  Dr Caryl Comes ? ?[name] is a 81 y.o. male who presents today for routine electrophysiology followup.  Since last being seen in our clinic, the patient reports doing reasonably well.  Unfortunately, he continues to have afib.   + palpitations and fatigue. Today, he denies symptoms of chest pain, shortness of breath,  lower extremity edema, dizziness, presyncope, or syncope.  The patient is otherwise without complaint today.  ? ?Past Medical History:  ?Diagnosis Date  ? Allergic rhinitis   ? takes Allegra daily  ? Allergic rhinitis, cause unspecified 06/22/2012  ? Allergy   ? Arthritis   ? Atrial fibrillation ([place])   ? takes Tikosyn and Eliquis daily  ? 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 Chronic kidney disease (CKD), stage III (moderate) (HCC)   ? Clotting disorder ([place])   ? Coronary artery disease   ? LAD stenting 2004 non-DES  ? Depression   ? took Zoloft 1yr ago but nothing now  ? Diverticulosis   ? [place]) [place]   ? left popliteal artery greater than 10 years ago  ? Dysphagia   ? occasionally  ? History of blood clots 2003  ? left leg prior to fem pop  ? History of colon polyps   ? Hyperlipidemia   ? takes Tricor and Lipitor daily  ? Insomnia   ? takes Ambien nightly as needed  ? Joint pain   ? Joint swelling   ? Neuromuscular disorder ([place]   ? 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 OSA (obstructive sleep apnea)   ? CPAP  ? Pacemaker   ? Peripheral vascular disease ([place]   ? Pleurisy   ? early 832's ? Prostatitis   ? Sleep apnea   ? Urinary urgency   ? ?Past Surgical History:  ?Procedure Laterality Date  ? ATRIAL FIBRILLATION ABLATION N/A 06/30/2021  ? Procedure: ATRIAL FIBRILLATION ABLATION;  Surgeon: AThompson Grayer MD;  [facility];  Service: Cardiovascular;  Laterality: N/A;  ? CARDIAC CATHETERIZATION  09/11/2002  ? with 2 stents  ? CARDIAC CATHETERIZATION N/A  05/06/2015  ? Procedure: Left Heart Cath and Coronary Angiography;  Surgeon: HBelva Crome MD;  [facility];  Service: Cardiovascular;  Laterality: N/A;  ? CARDIOVERSION N/A 12/18/2020  ? Procedure: CARDIOVERSION;  Surgeon: NJosue Hector MD;  [facility]  Service: Cardiovascular;  Laterality: N/A;  ? COLONOSCOPY    ? hand and arm surgery Right   ? as a teenager  ? HAND SURGERY Left   ? INGUINAL HERNIA REPAIR Right   ? IR RADIOLOGIST EVAL & MGMT  01/05/2021  ? JOINT REPLACEMENT Left 12/09/2013  ? Left Hip   ? JOINT REPLACEMENT Right 05/05/2014  ? Right Hip  ? left knee surgery    ? PACEMAKER INSERTION  09/25/1996  ? due to bradycardia  ? PERMANENT PACEMAKER GENERATOR CHANGE N/A 06/17/2012  ? Procedure: PERMANENT PACEMAKER GENERATOR CHANGE;  Surgeon: SDeboraha Sprang MD;  [facility];  Service: Cardiovascular;  Laterality: N/A;  ? right and left fem-pop bypass    ? SHOULDER ARTHROSCOPY WITH ROTATOR CUFF REPAIR Right 05/31/2021  ? Procedure: RIGHT SHOULDER ARTHROSCOPY ACROMIOPLASTY AND DEBRIDEMENT;  Surgeon: DMelrose Nakayama MD;  [facility];  Service: Orthopedics;  Laterality: Right;  ? TOTAL HIP ARTHROPLASTY Left 12/09/2013  ? Procedure: TOTAL HIP  ARTHROPLASTY ANTERIOR APPROACH;  Surgeon: Hessie Dibble, MD;  Location: [place];  Service: Orthopedics;  Laterality: Left;  left anterior total hip arthroplasty  ? TOTAL HIP ARTHROPLASTY Right 05/05/2014  ? Procedure: TOTAL HIP ARTHROPLASTY ANTERIOR APPROACH;  Surgeon: Hessie Dibble, MD;  Location: [place];  Service: Orthopedics;  Laterality: Right;  ? TURBINATE REDUCTION    ? wisdom extracted     ? ? ?ROS- all systems are reviewed and negative except as per HPI above ? ?Current Outpatient Medications  ?Medication Sig Dispense Refill  ? acetaminophen (TYLENOL) 500 MG tablet Take 500 mg by mouth every 6 (six) hours as needed (pain/headaches).    ? albuterol (PROVENTIL) (2.5 MG/3ML) 0.083% nebulizer solution Take 3 mLs (2.5 mg total)  by nebulization every 6 (six) hours as needed for wheezing or shortness of breath. 75 mL 12  ? apixaban (ELIQUIS) 5 MG TABS tablet TAKE 1 TABLET BY MOUTH  TWICE DAILY 180 tablet 1  ? atorvastatin (LIPITOR) 80 MG tablet TAKE 1 TABLET BY MOUTH  DAILY 90 tablet 3  ? carvedilol (COREG) 25 MG tablet TAKE 2 TABLETS BY MOUTH  TWICE DAILY 360 tablet 3  ? cholecalciferol (VITAMIN D) 25 MCG (1000 UNIT) tablet Take 1,000 Units by mouth in the morning, at noon, in the evening, and at bedtime.    ? Coenzyme Q10 300 MG CAPS Take 300 mg by mouth in the morning.    ? Cyanocobalamin (VITAMIN B 12) 500 MCG TABS Take 500 mcg by mouth daily.    ? dapagliflozin propanediol (FARXIGA) 10 MG TABS tablet Take 1 tablet (10 mg total) by mouth daily before breakfast. 90 tablet 2  ? dofetilide (TIKOSYN) 500 MCG capsule Take 1 capsule (500 mcg total) by mouth 2 (two) times daily. 180 capsule 3  ? fenofibrate 160 MG tablet TAKE 1 TABLET BY MOUTH  DAILY 90 tablet 3  ? fexofenadine (ALLEGRA) 180 MG tablet Take 90 mg by mouth 2 (two) times daily.    ? Fluocinonide Emulsified Base 0.05 % CREA APPLY TO AFFECTED AREA(S)  TWO TIMES DAILY AS NEEDED 60 g 2  ? folic acid (FOLVITE) 546 MCG tablet Take 400 mcg by mouth in the morning.    ? Lysine 500 MG TABS Take 500 mg by mouth 3 (three) times daily.    ? Magnesium 250 MG TABS Take 125 mg by mouth 2 (two) times daily.    ? potassium chloride (KLOR-CON M) 10 MEQ tablet TAKE 1 TABLET BY MOUTH  TWICE DAILY 180 tablet 3  ? ranolazine (RANEXA) 500 MG 12 hr tablet TAKE 1 TABLET BY MOUTH  TWICE DAILY 180 tablet 3  ? sacubitril-valsartan (ENTRESTO) 24-26 MG Take 1 tablet by mouth 2 (two) times daily. 180 tablet 3  ? triamcinolone (NASACORT) 55 MCG/ACT AERO nasal inhaler Place 1 spray into the nose in the morning.    ? ?No current facility-administered medications for this visit.  ? ? ?Physical Exam: ?Vitals:  ? 12/15/21 1413  ?BP: 100/60  ?Pulse: (!) 104  ?SpO2: 96%  ?Weight: 205 lb 9.6 oz (93.3 kg)  ?Height: '6\' 3"'$   (1.905 m)  ? ? ?GEN- The patient is well appearing, alert and oriented x 3 today.   ?Head- normocephalic, atraumatic ?Eyes-  Sclera clear, conjunctiva pink ?Ears- hearing intact ?Oropharynx- clear ?Lungs-  normal work of breathing ?Chest- pacemaker pocket is well healed ?Heart- iRRR ?GI- soft  ?Extremities- no clubbing, cyanosis, or edema ? ?Pacemaker interrogation- reviewed in detail today,  See  PACEART report ? ?ekg tracing ordered today is personally reviewed and shows afib, iLBBB ? ?Assessment and Plan: ? ?1. Symptomatic sinus bradycardia  ?Normal pacemaker function ?See Claudia Desanctis Art report ?No changes today ?he is not device dependant today ? ?2. Paroxysmal atrial fibrillation ?Afib burden is 11% by PPM interrogation ?Chads2vasc score is 4.  He is on eliquis ?He has not tolerated amiodarone previously due to neuropathy.  He has failed medical therapy with Phyllis Ginger also ? ?Therapeutic strategies for afib including medicine and ablation were discussed in detail with the patient today. Risk, benefits, and alternatives to repeat EP study and radiofrequency ablation for afib were also discussed in detail today. These risks include but are not limited to stroke, bleeding, vascular damage, tamponade, perforation, damage to the esophagus, lungs, and other structures, pulmonary vein stenosis, worsening renal function, and death. The patient understands these risk and wishes to proceed.  We will therefore proceed with catheter ablation at the next available time.  Carto, ICE, anesthesia are requested for the procedure.  Will also obtain cardiac CT prior to the procedure to exclude LAA thrombus and further evaluate atrial anatomy. ? ? ?3. Chronic systolic dysfunction ?EF 40-45% ?Repeat echo once AF is controlled ? ?4. OSA ?Compliance with CPAP advised ? ?Risks, benefits and potential toxicities for medications prescribed and/or refilled reviewed with patient today.  ? ?Thompson Grayer MD, FACC ?12/15/2021 ?2:30 PM ? ? ?

## 2021-12-15 NOTE — Patient Instructions (Addendum)
Medication Instructions:  ?Your physician recommends that you continue on your current medications as directed. Please refer to the Current Medication list given to you today. ?*If you need a refill on your cardiac medications before your next appointment, please call your pharmacy* ? ?Lab Work: ?CBC, BMP ?If you have labs (blood work) drawn today and your tests are completely normal, you will receive your results only by: ?MyChart Message (if you have MyChart) OR ?A paper copy in the mail ?If you have any lab test that is abnormal or we need to change your treatment, we will call you to review the results. ? ?Testing/Procedures: ?Your physician has requested that you have cardiac CT. Cardiac computed tomography (CT) is a painless test that uses an x-ray machine to take clear, detailed pictures of your heart. For further information please visit [email or web address]. Please follow instruction sheet as given. ? ?Your physician has recommended that you have an ablation. Catheter ablation is a medical procedure used to treat some cardiac arrhythmias (irregular heartbeats). During catheter ablation, a long, thin, flexible tube is put into a blood vessel in your groin (upper thigh), or neck. This tube is called an ablation catheter. It is then guided to your heart through the blood vessel. Radio frequency waves destroy small areas of heart tissue where abnormal heartbeats may cause an arrhythmia to start. Please see the instruction sheet given to you today. ? ? ?Follow-Up: ?At [facility], you and your health needs are our priority.  As part of our continuing mission to provide you with exceptional heart care, we have created designated Provider Care Teams.  These Care Teams include your primary Cardiologist (physician) and Advanced Practice Providers (APPs -  Physician Assistants and Nurse Practitioners) who all work together to provide you with the care you need, when you need it. ? ?Your physician wants you to  follow-up in: see instruction letter.  ? ?We recommend signing up for the patient portal called "MyChart".  Sign up information is provided on this After Visit Summary.  MyChart is used to connect with patients for Virtual Visits (Telemedicine).  Patients are able to view lab/test results, encounter notes, upcoming appointments, etc.  Non-urgent messages can be sent to your provider as well.   ?To learn more about what you can do with MyChart, go to [email or web address].   ? ?Any Other Special Instructions Will Be Listed Below (If Applicable). ? ?Cardiac Ablation ?Cardiac ablation is a procedure to destroy (ablate) some heart tissue that is sending bad signals. These bad signals cause problems in heart rhythm. ?The heart has many areas that make these signals. If there are problems in these areas, they can make the heart beat in a way that is not normal. Destroying some tissues can help make the heart rhythm normal. ?Tell your doctor about: ?Any allergies you have. ?All medicines you are taking. These include vitamins, herbs, eye drops, creams, and over-the-counter medicines. ?Any problems you or family members have had with medicines that make you fall asleep (anesthetics). ?Any blood disorders you have. ?Any surgeries you have had. ?Any medical conditions you have, such as kidney failure. ?Whether you are pregnant or may be pregnant. ?What are the risks? ?This is a safe procedure. But problems may occur, including: ?Infection. ?Bruising and bleeding. ?Bleeding into the chest. ?Stroke or blood clots. ?Damage to nearby areas of your body. ?Allergies to medicines or dyes. ?The need for a pacemaker if the normal system is damaged. ?Failure of the procedure to treat the problem. ?  What happens before the procedure? ?Medicines ?Ask your doctor about: ?Changing or stopping your normal medicines. This is important. ?Taking aspirin and ibuprofen. Do not take these medicines unless your doctor tells you to take  them. ?Taking other medicines, vitamins, herbs, and supplements. ?General instructions ?Follow instructions from your doctor about what you cannot eat or drink. ?Plan to have someone take you home from the hospital or clinic. ?If you will be going home right after the procedure, plan to have someone with you for 24 hours. ?Ask your doctor what steps will be taken to prevent infection. ?What happens during the procedure? ? ?An IV tube will be put into one of your veins. ?You will be given a medicine to help you relax. ?The skin on your neck or groin will be numbed. ?A cut (incision) will be made in your neck or groin. A needle will be put through your cut and into a large vein. ?A tube (catheter) will be put into the needle. The tube will be moved to your heart. ?Dye may be put through the tube. This helps your doctor see your heart. ?Small devices (electrodes) on the tube will send out signals. ?A type of energy will be used to destroy some heart tissue. ?The tube will be taken out. ?Pressure will be held on your cut. This helps stop bleeding. ?A bandage will be put over your cut. ?The exact procedure may vary among doctors and hospitals. ?What happens after the procedure? ?You will be watched until you leave the hospital or clinic. This includes checking your heart rate, breathing rate, oxygen, and blood pressure. ?Your cut will be watched for bleeding. You will need to lie still for a few hours. ?Do not drive for 24 hours or as long as your doctor tells you. ?Summary ?Cardiac ablation is a procedure to destroy some heart tissue. This is done to treat heart rhythm problems. ?Tell your doctor about any medical conditions you may have. Tell him or her about all medicines you are taking to treat them. ?This is a safe procedure. But problems may occur. These include infection, bruising, bleeding, and damage to nearby areas of your body. ?Follow what your doctor tells you about food and drink. You may also be told to  change or stop some of your medicines. ?After the procedure, do not drive for 24 hours or as long as your doctor tells you. ?This information is not intended to replace advice given to you by your health care provider. Make sure you discuss any questions you have with your health care provider. ?Document Revised: 07/31/2019 Document Reviewed: 07/31/2019 ?Elsevier Patient Education ? 2022 [place]. ? ? ? ?  ? ? ?

## 2021-12-16 LAB — CBC WITH DIFFERENTIAL/PLATELET
Basophils Absolute: 0 10*3/uL (ref 0.0–0.2)
Basos: 1 %
EOS (ABSOLUTE): 0.2 10*3/uL (ref 0.0–0.4)
Eos: 4 %
Hematocrit: 41.6 % (ref 37.5–51.0)
Hemoglobin: 14.1 g/dL (ref 13.0–17.7)
Immature Grans (Abs): 0 10*3/uL (ref 0.0–0.1)
Immature Granulocytes: 0 %
Lymphocytes Absolute: 1.2 10*3/uL (ref 0.7–3.1)
Lymphs: 22 %
MCH: 32.8 pg (ref 26.6–33.0)
MCHC: 33.9 g/dL (ref 31.5–35.7)
MCV: 97 fL (ref 79–97)
Monocytes Absolute: 0.5 10*3/uL (ref 0.1–0.9)
Monocytes: 9 %
Neutrophils Absolute: 3.4 10*3/uL (ref 1.4–7.0)
Neutrophils: 64 %
Platelets: 129 10*3/uL — ABNORMAL LOW (ref 150–450)
RBC: 4.3 x10E6/uL (ref 4.14–5.80)
RDW: 12.4 % (ref 11.6–15.4)
WBC: 5.4 10*3/uL (ref 3.4–10.8)

## 2021-12-16 LAB — BASIC METABOLIC PANEL
BUN/Creatinine Ratio: 15 (ref 10–24)
BUN: 21 mg/dL (ref 8–27)
CO2: 20 mmol/L (ref 20–29)
Calcium: 9.8 mg/dL (ref 8.6–10.2)
Chloride: 108 mmol/L — ABNORMAL HIGH (ref 96–106)
Creatinine, Ser: 1.37 mg/dL — ABNORMAL HIGH (ref 0.76–1.27)
Glucose: 80 mg/dL (ref 70–99)
Potassium: 4.6 mmol/L (ref 3.5–5.2)
Sodium: 145 mmol/L — ABNORMAL HIGH (ref 134–144)
eGFR: 52 mL/min/{1.73_m2} — ABNORMAL LOW (ref >59)

## 2021-12-28 ENCOUNTER — Ambulatory Visit (INDEPENDENT_AMBULATORY_CARE_PROVIDER_SITE_OTHER): Payer: Medicare Other | Admitting: Pulmonary Disease

## 2021-12-28 ENCOUNTER — Ambulatory Visit: Payer: Medicare Other | Admitting: Pulmonary Disease

## 2021-12-28 ENCOUNTER — Encounter: Payer: Self-pay | Admitting: Pulmonary Disease

## 2021-12-28 VITALS — BP 112/68 | HR 77 | Ht 75.0 in | Wt 204.6 lb

## 2021-12-28 DIAGNOSIS — J849 Interstitial pulmonary disease, unspecified: Secondary | ICD-10-CM | POA: Diagnosis not present

## 2021-12-28 DIAGNOSIS — J479 Bronchiectasis, uncomplicated: Secondary | ICD-10-CM | POA: Diagnosis not present

## 2021-12-28 LAB — PULMONARY FUNCTION TEST
DL/VA % pred: 73 %
DL/VA: 2.76 ml/min/mmHg/L
DLCO cor % pred: 57 %
DLCO cor: 16.07 ml/min/mmHg
DLCO unc % pred: 56 %
DLCO unc: 15.84 ml/min/mmHg
FEF 25-75 Post: 2.37 L/sec
FEF 25-75 Pre: 2.44 L/sec
FEF2575-%Change-Post: -2 %
FEF2575-%Pred-Post: 100 %
FEF2575-%Pred-Pre: 103 %
FEV1-%Change-Post: -2 %
FEV1-%Pred-Post: 94 %
FEV1-%Pred-Pre: 96 %
FEV1-Post: 3.24 L
FEV1-Pre: 3.32 L
FEV1FVC-%Change-Post: -1 %
FEV1FVC-%Pred-Pre: 105 %
FEV6-%Change-Post: -1 %
FEV6-%Pred-Post: 94 %
FEV6-%Pred-Pre: 95 %
FEV6-Post: 4.27 L
FEV6-Pre: 4.33 L
FEV6FVC-%Change-Post: 0 %
FEV6FVC-%Pred-Post: 103 %
FEV6FVC-%Pred-Pre: 103 %
FVC-%Change-Post: 0 %
FVC-%Pred-Post: 91 %
FVC-%Pred-Pre: 92 %
FVC-Post: 4.4 L
FVC-Pre: 4.44 L
Post FEV1/FVC ratio: 74 %
Post FEV6/FVC ratio: 97 %
Pre FEV1/FVC ratio: 75 %
Pre FEV6/FVC Ratio: 98 %
RV % pred: 92 %
RV: 2.75 L
TLC % pred: 87 %
TLC: 7.05 L

## 2021-12-28 NOTE — Patient Instructions (Signed)
We will check inflammatory labs today. ? 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  ? ?Follow up in 3 months to have further discussions on starting anti-fibrotic therapies.  ?

## 2021-12-28 NOTE — Progress Notes (Signed)
PFT done today. 

## 2021-12-28 NOTE — Progress Notes (Signed)
?    ?Synopsis: Referred in December 2022 for ILD by Thompson Grayer, MD ? ?Subjective:  ? ?PATIENT ID: [name] GENDER: male DOB: [birth year], MRN: [id number] ? ?HPI ? ?Chief Complaint  ?Patient presents with  ? Follow-up  ?  F/U after PFT. Increased chest tightness over the past 2-3 weeks. Increased chest congestion.   ? ?[name] is an 81 year old male, never smoker with atrial fibrillation, CKD III, CAD, OSA, DVT and PVD who returns to pulmonary clinic for interstitial lung disease.  ? ?PFTs today show moderate diffusion defect. We discussed the correlation between the high resolution CT Chest scan and the PFT results. He has exertional dyspnea only when really pushing himself. Otherwise no daily limitations in his activities due to dyspnea. He is having a repeat atrial fibrillation ablation on May 1.  ? ?OV 09/07/21 ?Patient had CT cardiac scan on 06/23/2021 fibrotic changes at the bilateral lung bases and it was recommended he have a high-resolution CT chest scan for further evaluation.  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  He was also noted to have mild central cylindrical bronchiectasis and a 4 mm subpleural posterior left lower lobe nodule. ? ?He denies any shortness of breath or limitations to his daily activities.  He denies any wheezing.  He does report a congested cough over the past 34 years.  The cough does not wake him up at night.  He feels like the cough is coming from deep in his chest and will produce whitish phlegm occasionally.  He reports he developed a cough since an episode of pleurisy in 1988. ? ?He is followed by ENT for perforated septum due to cauterization procedures for epistaxis in the past.  He is using Nasacort nasal spray daily.  He does have postnasal drainage.  He also reports dry nasal/sinus passages.  He has a history of obstructive sleep apnea and using his CPAP machine that is nearly  82 years old.  He does not use humidification.  He does not follow with a sleep medicine physician. ? ?He does report occasional GERD symptoms.  He has reduced his caffeine intake to reduce the symptoms.  He is a never smoker but does report significant secondhand smoke exposure in childhood.  He is on her [place] family homes, a single-family home [place].  He denies any significant dust exposures over the years as he has mainly been on the distal side. ? ?He denies any skin rashes or joint aches. ? ?Past Medical History:  ?Diagnosis Date  ? Allergic rhinitis   ? takes Allegra daily  ? Allergic rhinitis, cause unspecified 06/22/2012  ? Allergy   ? Arthritis   ? Atrial fibrillation ([place])   ? takes Tikosyn and Eliquis daily  ? 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 Chronic kidney disease (CKD), stage III (moderate) ([facility])   ? Clotting disorder ([place])   ? Coronary artery disease   ? LAD stenting 2004 non-DES  ? Depression   ? took Zoloft 26yr ago but nothing now  ? Diverticulosis   ? DVT (deep venous thrombosis) ([place]   ? left popliteal artery greater than 10 years ago  ? Dysphagia   ? occasionally  ? History of blood clots 2003  ? left leg prior to fem pop  ? History of colon polyps   ? Hyperlipidemia   ? takes Tricor and Lipitor daily  ? Insomnia   ?  takes Ambien nightly as needed  ? Joint pain   ? Joint swelling   ? Neuromuscular disorder ([place])   ? 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 OSA (obstructive sleep apnea)   ? CPAP  ? Pacemaker   ? Peripheral vascular disease ([place])   ? Pleurisy   ? early 57's  ? Prostatitis   ? Sleep apnea   ? Urinary urgency   ?  ? ?Family History  ?Problem Relation Age of Onset  ? Diabetes Mother   ? Heart disease Mother   ?     Before age 50  ? Heart disease Father   ?     Before age 57  ? Heart attack Father   ? Stroke Father   ? Aneurysm Father   ?     bilat pop art aneurysms  ? Peripheral vascular disease Father   ?     Popliteal Aneurysm  ?  Diabetes Other   ?     family hx  ? Sleep apnea Other   ?     family hx  ? Allergic rhinitis Neg Hx   ? Angioedema Neg Hx   ? Asthma Neg Hx   ? Eczema Neg Hx   ? Immunodeficiency Neg Hx   ? Urticaria Neg Hx   ? Colon cancer Neg Hx   ? Esophageal cancer Neg Hx   ? Prostate cancer Neg Hx   ? Rectal cancer Neg Hx   ? Stomach cancer Neg Hx   ?  ? ?Social History  ? ?Socioeconomic History  ? Marital status: Married  ?  Spouse name: Not on file  ? Number of children: Not on file  ? Years of education: Not on file  ? Highest education level: Not on file  ?Occupational History  ? Not on file  ?Tobacco Use  ? Smoking status: Never  ?  Passive exposure: Past  ? Smokeless tobacco: Never  ?Vaping Use  ? Vaping Use: Never used  ?Substance and Sexual Activity  ? Alcohol use: No  ?  Alcohol/week: 0.0 standard drinks  ?  Comment: nothing since 2000  ? Drug use: No  ? Sexual activity: Yes  ?Other Topics Concern  ? Not on file  ?Social History Narrative  ? 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   ? ?Social Determinants of Health  ? ?Financial Resource Strain: Low Risk   ? Difficulty of Paying Living Expenses: Not hard at all  ?Food Insecurity: No Food Insecurity  ? Worried About Charity fundraiser in the Last Year: Never true  ? Ran Out of Food in the Last Year: Never true  ?Transportation Needs: No Transportation Needs  ? Lack of Transportation (Medical): No  ? Lack of Transportation (Non-Medical): No  ?Physical Activity: Insufficiently Active  ? Days of Exercise per Week: 3 days  ? Minutes of Exercise per Session: 40 min  ?Stress: No Stress Concern Present  ? Feeling of Stress : Not at all  ?Social Connections: Moderately Integrated  ? Frequency of Communication with Friends and Family: Three times a week  ? Frequency of Social Gatherings with Friends and Family: Three times a week  ? Attends Religious Services: Never  ? Active Member of Clubs or Organizations: Yes  ? Attends Archivist Meetings: More than 4  times per year  ? Marital Status: Married  ?Intimate Partner Violence: Not At Risk  ? Fear of Current or  Ex-Partner: No  ? Emotionally Abused: No  ? Physically Abused: No  ? Sexually Abused: No  ?  ? ?Allergies  ?Allergen Reactions  ? Amiodarone Other (See Comments)  ?  Peripheral neuropathy resulted  ? Niacin Other (See Comments)  ?  Caused an irregular heartbeat  ? Ace Inhibitors Cough  ? Mometasone Furoate Other (See Comments)  ?  (Nasonex) Causes nasal bleeding and nose bleeds  ?  ? ?Outpatient Medications Prior to Visit  ?Medication Sig Dispense Refill  ? acetaminophen (TYLENOL) 500 MG tablet Take 500 mg by mouth every 6 (six) hours as needed (pain/headaches).    ? albuterol (PROVENTIL) (2.5 MG/3ML) 0.083% nebulizer solution Take 3 mLs (2.5 mg total) by nebulization every 6 (six) hours as needed for wheezing or shortness of breath. 75 mL 12  ? apixaban (ELIQUIS) 5 MG TABS tablet TAKE 1 TABLET BY MOUTH  TWICE DAILY 180 tablet 1  ? atorvastatin (LIPITOR) 80 MG tablet TAKE 1 TABLET BY MOUTH  DAILY 90 tablet 3  ? carvedilol (COREG) 25 MG tablet TAKE 2 TABLETS BY MOUTH  TWICE DAILY 360 tablet 3  ? cholecalciferol (VITAMIN D) 25 MCG (1000 UNIT) tablet Take 1,000 Units by mouth in the morning, at noon, in the evening, and at bedtime.    ? Coenzyme Q10 300 MG CAPS Take 300 mg by mouth in the morning.    ? Cyanocobalamin (VITAMIN B 12) 500 MCG TABS Take 500 mcg by mouth daily.    ? dapagliflozin propanediol (FARXIGA) 10 MG TABS tablet Take 1 tablet (10 mg total) by mouth daily before breakfast. 90 tablet 2  ? dofetilide (TIKOSYN) 500 MCG capsule Take 1 capsule (500 mcg total) by mouth 2 (two) times daily. 180 capsule 3  ? fenofibrate 160 MG tablet TAKE 1 TABLET BY MOUTH  DAILY 90 tablet 3  ? fexofenadine (ALLEGRA) 180 MG tablet Take 90 mg by mouth 2 (two) times daily.    ? Fluocinonide Emulsified Base 0.05 % CREA APPLY TO AFFECTED AREA(S)  TWO TIMES DAILY AS NEEDED 60 g 2  ? folic acid (FOLVITE) 500 MCG tablet Take 400  mcg by mouth in the morning.    ? Lysine 500 MG TABS Take 500 mg by mouth 3 (three) times daily.    ? Magnesium 250 MG TABS Take 125 mg by mouth 2 (two) times daily.    ? metoprolol tartrate (LOPRESSOR) 2

## 2021-12-29 ENCOUNTER — Other Ambulatory Visit (INDEPENDENT_AMBULATORY_CARE_PROVIDER_SITE_OTHER): Payer: Medicare Other

## 2021-12-29 DIAGNOSIS — J849 Interstitial pulmonary disease, unspecified: Secondary | ICD-10-CM

## 2021-12-29 LAB — CK: Total CK: 71 U/L (ref 7–232)

## 2021-12-30 ENCOUNTER — Telehealth (HOSPITAL_COMMUNITY): Payer: Self-pay | Admitting: *Deleted

## 2021-12-30 LAB — ANTI-SMITH ANTIBODY: ENA SM Ab Ser-aCnc: <1 AI

## 2021-12-30 NOTE — Telephone Encounter (Signed)
Attempted to call patient regarding upcoming cardiac CT appointment. °Left message on voicemail with name and callback number ° °Annete Ayuso RN Navigator Cardiac Imaging °[facility] °[phone] Office °[phone] Cell ° °

## 2022-01-02 ENCOUNTER — Ambulatory Visit (HOSPITAL_COMMUNITY)
Admission: RE | Admit: 2022-01-02 | Discharge: 2022-01-02 | Disposition: A | Discharge status: Home / Self Care | Payer: Medicare Other | Source: Ambulatory Visit | Attending: Internal Medicine | Admitting: Internal Medicine

## 2022-01-02 DIAGNOSIS — I4891 Unspecified atrial fibrillation: Secondary | ICD-10-CM

## 2022-01-02 MED ORDER — IOHEXOL 350 MG/ML SOLN
100.0000 mL | Freq: Once | INTRAVENOUS | Status: AC | PRN
  Administered 2022-01-02: 100 mL via INTRAVENOUS

## 2022-01-06 NOTE — Pre-Procedure Instructions (Signed)
Attempted to call patient regarding procedure instructions.  Left voicemail on the following items: ?Arrival time 0830 ?Nothing to eat or drink after midnight ?No meds AM of procedure ?Responsible person to drive you home and stay with you for 24 hrs ? ?Have you missed any doses of anti-coagulant Eliquis- take both doses through the weekend, don't take on the morning of procedure ?   ?

## 2022-01-07 LAB — RHEUMATOID FACTOR: Rheumatoid fact SerPl-aCnc: <14 IU/mL (ref <14)

## 2022-01-07 LAB — HYPERSENSITIVITY PNUEMONITIS PROFILE
ASPERGILLUS FUMIGATUS: NEGATIVE
Faenia retivirgula: NEGATIVE
Pigeon Serum: NEGATIVE
S. VIRIDIS: NEGATIVE
T. CANDIDUS: NEGATIVE
T. VULGARIS: NEGATIVE

## 2022-01-07 LAB — SJOGRENS SYNDROME-A EXTRACTABLE NUCLEAR ANTIBODY: SSA (Ro) (ENA) Antibody, IgG: <1 AI

## 2022-01-07 LAB — ANCA SCREEN W REFLEX TITER: ANCA SCREEN: NEGATIVE

## 2022-01-07 LAB — SJOGRENS SYNDROME-B EXTRACTABLE NUCLEAR ANTIBODY: SSB (La) (ENA) Antibody, IgG: <1 AI

## 2022-01-07 LAB — CYCLIC CITRUL PEPTIDE ANTIBODY, IGG: Cyclic Citrullin Peptide Ab: <16 UNITS

## 2022-01-07 LAB — RNP ANTIBODY: Ribonucleic Protein(ENA) Antibody, IgG: <1 AI

## 2022-01-07 LAB — ANA: Anti Nuclear Antibody (ANA): NEGATIVE

## 2022-01-09 ENCOUNTER — Ambulatory Visit (HOSPITAL_BASED_OUTPATIENT_CLINIC_OR_DEPARTMENT_OTHER): Payer: Medicare Other | Admitting: Certified Registered Nurse Anesthetist

## 2022-01-09 ENCOUNTER — Encounter (HOSPITAL_COMMUNITY)
Admission: RE | Disposition: A | Discharge status: Home / Self Care | Payer: Medicare Other | Source: Home / Self Care | Attending: Internal Medicine

## 2022-01-09 ENCOUNTER — Other Ambulatory Visit: Payer: Self-pay

## 2022-01-09 ENCOUNTER — Ambulatory Visit (HOSPITAL_COMMUNITY): Payer: Medicare Other | Admitting: Certified Registered Nurse Anesthetist

## 2022-01-09 ENCOUNTER — Ambulatory Visit (HOSPITAL_COMMUNITY)
Admission: RE | Admit: 2022-01-09 | Discharge: 2022-01-09 | Disposition: A | Discharge status: Home / Self Care | Payer: Medicare Other | Attending: Internal Medicine | Admitting: Internal Medicine

## 2022-01-09 DIAGNOSIS — I2510 Atherosclerotic heart disease of native coronary artery without angina pectoris: Secondary | ICD-10-CM

## 2022-01-09 DIAGNOSIS — I4891 Unspecified atrial fibrillation: Secondary | ICD-10-CM

## 2022-01-09 DIAGNOSIS — Z950 Presence of cardiac pacemaker: Secondary | ICD-10-CM | POA: Insufficient documentation

## 2022-01-09 DIAGNOSIS — G629 Polyneuropathy, unspecified: Secondary | ICD-10-CM | POA: Insufficient documentation

## 2022-01-09 DIAGNOSIS — Z9989 Dependence on other enabling machines and devices: Secondary | ICD-10-CM

## 2022-01-09 DIAGNOSIS — G4733 Obstructive sleep apnea (adult) (pediatric): Secondary | ICD-10-CM | POA: Diagnosis not present

## 2022-01-09 DIAGNOSIS — I495 Sick sinus syndrome: Secondary | ICD-10-CM | POA: Diagnosis not present

## 2022-01-09 DIAGNOSIS — Z7901 Long term (current) use of anticoagulants: Secondary | ICD-10-CM | POA: Insufficient documentation

## 2022-01-09 DIAGNOSIS — I519 Heart disease, unspecified: Secondary | ICD-10-CM | POA: Insufficient documentation

## 2022-01-09 DIAGNOSIS — I4819 Other persistent atrial fibrillation: Secondary | ICD-10-CM | POA: Diagnosis not present

## 2022-01-09 HISTORY — PX: ATRIAL FIBRILLATION ABLATION: EP1191

## 2022-01-09 LAB — POCT ACTIVATED CLOTTING TIME
Activated Clotting Time: 299 seconds
Activated Clotting Time: 305 seconds

## 2022-01-09 SURGERY — ATRIAL FIBRILLATION ABLATION
Anesthesia: General

## 2022-01-09 MED ORDER — ONDANSETRON HCL 4 MG/2ML IJ SOLN
INTRAMUSCULAR | Status: DC | PRN
  Administered 2022-01-09: 4 mg via INTRAVENOUS

## 2022-01-09 MED ORDER — FENTANYL CITRATE (PF) 250 MCG/5ML IJ SOLN
INTRAMUSCULAR | Status: DC | PRN
  Administered 2022-01-09: 50 ug via INTRAVENOUS

## 2022-01-09 MED ORDER — PHENYLEPHRINE HCL-NACL 20-0.9 MG/250ML-% IV SOLN
INTRAVENOUS | Status: DC | PRN
  Administered 2022-01-09: 20 ug/min via INTRAVENOUS

## 2022-01-09 MED ORDER — APIXABAN 5 MG PO TABS
5.0000 mg | ORAL_TABLET | Freq: Once | ORAL | Status: AC
  Administered 2022-01-09: 5 mg via ORAL
  Filled 2022-01-09: qty 1

## 2022-01-09 MED ORDER — ACETAMINOPHEN 325 MG PO TABS: 650.0000 mg | ORAL_TABLET | ORAL | Status: DC | PRN

## 2022-01-09 MED ORDER — ROCURONIUM BROMIDE 10 MG/ML (PF) SYRINGE
PREFILLED_SYRINGE | INTRAVENOUS | Status: DC | PRN
  Administered 2022-01-09: 120 mg via INTRAVENOUS

## 2022-01-09 MED ORDER — LIDOCAINE 2% (20 MG/ML) 5 ML SYRINGE
INTRAMUSCULAR | Status: DC | PRN
  Administered 2022-01-09: 100 mg via INTRAVENOUS

## 2022-01-09 MED ORDER — SODIUM CHLORIDE 0.9% FLUSH: 3.0000 mL | INTRAVENOUS | Status: DC | PRN

## 2022-01-09 MED ORDER — HEPARIN SODIUM (PORCINE) 1000 UNIT/ML IJ SOLN
INTRAMUSCULAR | Status: DC | PRN
Start: 2022-01-09 — End: 2022-01-09
  Administered 2022-01-09: 15000 [IU] via INTRAVENOUS
  Administered 2022-01-09: 1000 [IU] via INTRAVENOUS

## 2022-01-09 MED ORDER — PANTOPRAZOLE SODIUM 40 MG PO TBEC
40.0000 mg | DELAYED_RELEASE_TABLET | Freq: Every day | ORAL | 0 refills | Status: DC
Start: 2022-01-09 — End: 2022-05-16

## 2022-01-09 MED ORDER — HEPARIN (PORCINE) IN NACL 1000-0.9 UT/500ML-% IV SOLN
INTRAVENOUS | Status: DC | PRN
  Administered 2022-01-09 (×3): 500 mL

## 2022-01-09 MED ORDER — DEXAMETHASONE SODIUM PHOSPHATE 10 MG/ML IJ SOLN
INTRAMUSCULAR | Status: DC | PRN
  Administered 2022-01-09: 10 mg via INTRAVENOUS

## 2022-01-09 MED ORDER — ONDANSETRON HCL 4 MG/2ML IJ SOLN: 4.0000 mg | Freq: Four times a day (QID) | INTRAMUSCULAR | Status: DC | PRN

## 2022-01-09 MED ORDER — SODIUM CHLORIDE 0.9 % IV SOLN: 250.0000 mL | INTRAVENOUS | Status: DC | PRN

## 2022-01-09 MED ORDER — SODIUM CHLORIDE 0.9% FLUSH: 3.0000 mL | Freq: Two times a day (BID) | INTRAVENOUS | Status: DC

## 2022-01-09 MED ORDER — HYDROCODONE-ACETAMINOPHEN 5-325 MG PO TABS: 1.0000 | ORAL_TABLET | ORAL | Status: DC | PRN

## 2022-01-09 MED ORDER — HEPARIN SODIUM (PORCINE) 1000 UNIT/ML IJ SOLN
INTRAMUSCULAR | Status: DC | PRN
  Administered 2022-01-09: 2000 [IU] via INTRAVENOUS
  Administered 2022-01-09: 15000 [IU] via INTRAVENOUS

## 2022-01-09 MED ORDER — SODIUM CHLORIDE 0.9 % IV SOLN: INTRAVENOUS | Status: DC

## 2022-01-09 MED ORDER — HEPARIN SODIUM (PORCINE) 1000 UNIT/ML IJ SOLN
INTRAMUSCULAR | Status: AC
  Filled 2022-01-09: qty 20

## 2022-01-09 MED ORDER — PROPOFOL 10 MG/ML IV BOLUS
INTRAVENOUS | Status: DC | PRN
Start: 2022-01-09 — End: 2022-01-09
  Administered 2022-01-09: 120 mg via INTRAVENOUS

## 2022-01-09 MED ORDER — SUGAMMADEX SODIUM 200 MG/2ML IV SOLN
INTRAVENOUS | Status: DC | PRN
  Administered 2022-01-09: 200 mg via INTRAVENOUS

## 2022-01-09 MED ORDER — PROTAMINE SULFATE 10 MG/ML IV SOLN
INTRAVENOUS | Status: DC | PRN
Start: 2022-01-09 — End: 2022-01-09
  Administered 2022-01-09: 30 mg via INTRAVENOUS

## 2022-01-09 MED ORDER — HEPARIN (PORCINE) IN NACL 1000-0.9 UT/500ML-% IV SOLN
INTRAVENOUS | Status: AC
  Filled 2022-01-09: qty 1500

## 2022-01-09 SURGICAL SUPPLY — 20 items
BLANKET WARM UNDERBOD FULL ACC (MISCELLANEOUS) ×2 IMPLANT
CATH 8FR REPROCESSED SOUNDSTAR (CATHETERS) ×2 IMPLANT
CATH 8FR SOUNDSTAR REPROCESSED (CATHETERS) IMPLANT
CATH OCTARAY 1.5 F (CATHETERS) ×1 IMPLANT
CATH SMTCH THERMOCOOL SF DF (CATHETERS) ×1 IMPLANT
CATH WEBSTER BI DIR CS D-F CRV (CATHETERS) ×1 IMPLANT
CLOSURE PERCLOSE PROSTYLE (VASCULAR PRODUCTS) ×3 IMPLANT
COVER SWIFTLINK CONNECTOR (BAG) ×2 IMPLANT
NDL BAYLIS TRANSSEPTAL 71CM (NEEDLE) IMPLANT
NEEDLE BAYLIS TRANSSEPTAL 71CM (NEEDLE) ×2 IMPLANT
PACK EP LATEX FREE (CUSTOM PROCEDURE TRAY) ×2
PACK EP LF (CUSTOM PROCEDURE TRAY) ×1 IMPLANT
PAD DEFIB RADIO PHYSIO CONN (PAD) ×2 IMPLANT
PATCH CARTO3 (PAD) ×1 IMPLANT
SHEATH PINNACLE 7F 10CM (SHEATH) ×2 IMPLANT
SHEATH PINNACLE VASC 9FR (SHEATH) ×1 IMPLANT
SHEATH PROBE COVER 6X72 (BAG) ×1 IMPLANT
SHEATH SWARTZ TS SL2 63CM 8.5F (SHEATH) ×1 IMPLANT
TUBING SMART ABLATE COOLFLOW (TUBING) ×1 IMPLANT
WIRE MICROINTRODUCER 60CM (WIRE) ×1 IMPLANT

## 2022-01-09 NOTE — Progress Notes (Signed)
Patient and wife was given discharge instructions. Both verbalized understanding. 

## 2022-01-09 NOTE — Discharge Instructions (Signed)

## 2022-01-09 NOTE — Anesthesia Preprocedure Evaluation (Signed)
Anesthesia Evaluation  ?Patient identified by MRN, date of birth, ID band ?Patient awake ? ? ? ?Reviewed: ?Allergy & Precautions, NPO status , Patient's Chart, lab work & pertinent test results ? ?Airway ?Mallampati: II ? ?TM Distance: >3 FB ?Neck ROM: Full ? ? ? Dental ?no notable dental hx. ? ?  ?Pulmonary ?sleep apnea and Continuous Positive Airway Pressure Ventilation ,  ?  ?Pulmonary exam normal ?breath sounds clear to auscultation ? ? ? ? ? ? Cardiovascular ?+ CAD, + Cardiac Stents, + Peripheral Vascular Disease and + DVT  ?Normal cardiovascular exam+ dysrhythmias Atrial Fibrillation + pacemaker  ?Rhythm:Regular Rate:Normal ? ? ?  ?Neuro/Psych ?negative neurological ROS ? negative psych ROS  ? GI/Hepatic ?negative GI ROS, Neg liver ROS,   ?Endo/Other  ?negative endocrine ROS ? Renal/GU ?Renal InsufficiencyRenal disease  ?negative genitourinary ?  ?Musculoskeletal ?negative musculoskeletal ROS ?(+)  ? Abdominal ?  ?Peds ?negative pediatric ROS ?(+)  Hematology ?negative hematology ROS ?(+)   ?Anesthesia Other Findings ? ? Reproductive/Obstetrics ?negative OB ROS ? ?  ? ? ? ? ? ? ? ? ? ? ? ? ? ?  ?  ? ? ? ? ? ? ? ? ?Anesthesia Physical ?Anesthesia Plan ? ?ASA: 3 ? ?Anesthesia Plan: General  ? ?Post-op Pain Management: Minimal or no pain anticipated  ? ?Induction: Intravenous ? ?PONV Risk Score and Plan: 2 and Ondansetron, Dexamethasone and Treatment may vary due to age or medical condition ? ?Airway Management Planned: Oral ETT ? ?Additional Equipment:  ? ?Intra-op Plan:  ? ?Post-operative Plan: Extubation in OR ? ?Informed Consent: I have reviewed the patients History and Physical, chart, labs and discussed the procedure including the risks, benefits and alternatives for the proposed anesthesia with the patient or authorized representative who has indicated his/her understanding and acceptance.  ? ? ? ?Dental advisory given ? ?Plan Discussed with: CRNA and Surgeon ? ?Anesthesia  Plan Comments:   ? ? ? ? ? ? ?Anesthesia Quick Evaluation ? ?

## 2022-01-09 NOTE — Interval H&P Note (Signed)
History and Physical Interval Note: ? ?01/09/2022 ?10:25 AM ? ?[name]  has presented today for surgery, with the diagnosis of afib.  The various methods of treatment have been discussed with the patient and family. After consideration of risks, benefits and other options for treatment, the patient has consented to  Procedure(s): ?[place] (N/A) as a surgical intervention.  The patient's history has been reviewed, patient examined, no change in status, stable for surgery.  I have reviewed the patient's chart and labs.  Questions were answered to the patient's satisfaction.   ? ?Risk, benefits, and alternatives to EP study and radiofrequency ablation for afib were again discussed in detail today. These risks include but are not limited to stroke, bleeding, vascular damage, tamponade, perforation, damage to the esophagus, lungs, and other structures, pulmonary vein stenosis, worsening renal function, and death. The patient understands these risk and wishes to proceed.  ? ? ?Cardiac CT reviewed at length with the patient today. ? ?he reports compliance with [place] without interruption. ? ?Thompson Grayer MD, [place] ?01/09/2022 ?10:26 AM ? ? ? ? ?Thompson Grayer ? ? ?

## 2022-01-09 NOTE — Anesthesia Postprocedure Evaluation (Signed)
Anesthesia Post Note ? ?Patient: [name] ? ?Procedure(s) Performed: ATRIAL FIBRILLATION ABLATION ? ?  ? ?Patient location during evaluation: PACU ?Anesthesia Type: General ?Level of consciousness: awake and alert ?Pain management: pain level controlled ?Vital Signs Assessment: post-procedure vital signs reviewed and stable ?Respiratory status: spontaneous breathing, nonlabored ventilation, respiratory function stable and patient connected to nasal cannula oxygen ?Cardiovascular status: blood pressure returned to baseline and stable ?Postop Assessment: no apparent nausea or vomiting ?Anesthetic complications: no ? ? ?No notable events documented. ? ?Last Vitals:  ?Vitals:  ? 01/09/22 1300 01/09/22 1305  ?BP: (!) 104/58 (!) 114/59  ?Pulse: 81 81  ?Resp: 15 16  ?Temp:    ?SpO2: 96% 92%  ?  ?Last Pain:  ?Vitals:  ? 01/09/22 1251  ?TempSrc: Temporal  ?PainSc: 0-No pain  ? ? ?  ?  ?  ?  ?  ?  ? ?Leiann Sporer S ? ? ? ? ?

## 2022-01-09 NOTE — Transfer of Care (Signed)
Immediate Anesthesia Transfer of Care Note ? ?Patient: KENLY XIAO ? ?Procedure(s) Performed: ATRIAL FIBRILLATION ABLATION ? ?Patient Location: PACU and Cath Lab ? ?Anesthesia Type:General ? ?Level of Consciousness: awake, alert , oriented, patient cooperative and responds to stimulation ? ?Airway & Oxygen Therapy: Patient Spontanous Breathing and Patient connected to nasal cannula oxygen ? ?Post-op Assessment: Report given to RN and Post -op Vital signs reviewed and stable ? ?Post vital signs: Reviewed and stable ? ?Last Vitals:  ?Vitals Value Taken Time  ?BP 109/61 01/09/22 1250  ?Temp    ?Pulse 84 01/09/22 1252  ?Resp 27 01/09/22 1252  ?SpO2 97 % 01/09/22 1252  ?Vitals shown include unvalidated device data. ? ?Last Pain:  ?Vitals:  ? 01/09/22 0858  ?TempSrc: Oral  ?   ? ?  ? ?Complications: No notable events documented. ?

## 2022-01-09 NOTE — Anesthesia Procedure Notes (Signed)
Procedure Name: Intubation ?Date/Time: 01/09/2022 10:54 AM ?Performed by: Betha Loa, CRNA ?Pre-anesthesia Checklist: Patient identified, Emergency Drugs available, Suction available and Patient being monitored ?Patient Re-evaluated:Patient Re-evaluated prior to induction ?Oxygen Delivery Method: Circle System Utilized ?Preoxygenation: Pre-oxygenation with 100% oxygen ?Induction Type: IV induction ?Ventilation: Mask ventilation without difficulty, Oral airway inserted - appropriate to patient size and Two handed mask ventilation required ?Laryngoscope Size: Mac and 4 ?Grade View: Grade II ?Tube type: Oral ?Tube size: 7.5 mm ?Number of attempts: 1 ?Airway Equipment and Method: Stylet and Oral airway ?Placement Confirmation: ETT inserted through vocal cords under direct vision, positive ETCO2 and breath sounds checked- equal and bilateral ?Secured at: 22 cm ?Tube secured with: Tape ?Dental Injury: Teeth and Oropharynx as per pre-operative assessment  ?Comments: Pts head/neck remain in midline and neutral position of comfort.  Pillow doubled with gel roll at base of neck per pt comfort.  Atraumatic intubation. ? ? ? ? ?

## 2022-01-10 ENCOUNTER — Encounter (HOSPITAL_COMMUNITY): Payer: Self-pay | Admitting: Internal Medicine

## 2022-02-03 ENCOUNTER — Other Ambulatory Visit: Payer: Self-pay | Admitting: Internal Medicine

## 2022-02-07 ENCOUNTER — Encounter (HOSPITAL_COMMUNITY): Payer: Self-pay | Admitting: Physician Assistant

## 2022-02-07 ENCOUNTER — Ambulatory Visit (HOSPITAL_COMMUNITY)
Admission: RE | Admit: 2022-02-07 | Discharge: 2022-02-07 | Disposition: A | Discharge status: Home / Self Care | Payer: Medicare Other | Source: Ambulatory Visit | Attending: Physician Assistant | Admitting: Physician Assistant

## 2022-02-07 VITALS — BP 114/82 | HR 68 | Ht 75.0 in | Wt 192.2 lb

## 2022-02-07 DIAGNOSIS — Z7901 Long term (current) use of anticoagulants: Secondary | ICD-10-CM | POA: Diagnosis not present

## 2022-02-07 DIAGNOSIS — I491 Atrial premature depolarization: Secondary | ICD-10-CM | POA: Diagnosis not present

## 2022-02-07 DIAGNOSIS — I5022 Chronic systolic (congestive) heart failure: Secondary | ICD-10-CM | POA: Insufficient documentation

## 2022-02-07 DIAGNOSIS — I447 Left bundle-branch block, unspecified: Secondary | ICD-10-CM | POA: Diagnosis not present

## 2022-02-07 DIAGNOSIS — Z8249 Family history of ischemic heart disease and other diseases of the circulatory system: Secondary | ICD-10-CM | POA: Insufficient documentation

## 2022-02-07 DIAGNOSIS — E785 Hyperlipidemia, unspecified: Secondary | ICD-10-CM | POA: Insufficient documentation

## 2022-02-07 DIAGNOSIS — I2510 Atherosclerotic heart disease of native coronary artery without angina pectoris: Secondary | ICD-10-CM | POA: Diagnosis not present

## 2022-02-07 DIAGNOSIS — Z950 Presence of cardiac pacemaker: Secondary | ICD-10-CM | POA: Insufficient documentation

## 2022-02-07 DIAGNOSIS — D6869 Other thrombophilia: Secondary | ICD-10-CM | POA: Diagnosis not present

## 2022-02-07 DIAGNOSIS — I739 Peripheral vascular disease, unspecified: Secondary | ICD-10-CM | POA: Diagnosis not present

## 2022-02-07 DIAGNOSIS — G4733 Obstructive sleep apnea (adult) (pediatric): Secondary | ICD-10-CM | POA: Insufficient documentation

## 2022-02-07 DIAGNOSIS — I517 Cardiomegaly: Secondary | ICD-10-CM | POA: Diagnosis not present

## 2022-02-07 DIAGNOSIS — Z79899 Other long term (current) drug therapy: Secondary | ICD-10-CM | POA: Insufficient documentation

## 2022-02-07 DIAGNOSIS — R9431 Abnormal electrocardiogram [ECG] [EKG]: Secondary | ICD-10-CM | POA: Diagnosis not present

## 2022-02-07 DIAGNOSIS — I480 Paroxysmal atrial fibrillation: Secondary | ICD-10-CM | POA: Diagnosis not present

## 2022-02-07 DIAGNOSIS — I495 Sick sinus syndrome: Secondary | ICD-10-CM | POA: Insufficient documentation

## 2022-02-07 NOTE — Progress Notes (Signed)
Primary Care Physician: Biagio Borg, MD Primary Cardiologist: Dr Audie Box Primary Electrophysiologist: Dr Caryl Comes Referring Physician: Dr Caswell Corwin is a 81 y.o. male with a history of sick sinus syndrome s/p PPM, CAD, PVD, OSA, HLD, chronic systolic CHF, atrial fibrillation who presents for follow up in the [facility]. Patient is on Eliquis for a CHADS2VASC score of 4. Patient underwent afib ablation with Dr Rayann Heman on 06/30/21. Unfortunately, his afib burden increased again and he underwent repeat ablation on 01/09/22.  On follow up today, patient reports that he has done well since his procedure. He is in SR today. He denies CP, swallowing pain, or groin issues. No bleeding issues on anticoagulation.   Today, he denies symptoms of palpitations, chest pain, shortness of breath, orthopnea, PND, lower extremity edema, dizziness, presyncope, syncope, bleeding, or neurologic sequela. The patient is tolerating medications without difficulties and is otherwise without complaint today.    Atrial Fibrillation Risk Factors:  he does have symptoms or diagnosis of sleep apnea. he is compliant with CPAP therapy. he does not have a history of rheumatic fever. he does not have a history of alcohol use.   he has a BMI of Body mass index is 24.02 kg/m.Marland Kitchen Filed Weights   02/07/22 1151  Weight: 87.2 kg     Family History  Problem Relation Age of Onset   Diabetes Mother    Heart disease Mother        Before age 88   Heart disease Father        Before age 61   Heart attack Father    Stroke Father    Aneurysm Father        bilat pop art aneurysms   Peripheral vascular disease Father        Popliteal Aneurysm   Diabetes Other        family hx   Sleep apnea Other        family hx   Allergic rhinitis Neg Hx    Angioedema Neg Hx    Asthma Neg Hx    Eczema Neg Hx    Immunodeficiency Neg Hx    Urticaria Neg Hx    Colon cancer Neg Hx    Esophageal  cancer Neg Hx    Prostate cancer Neg Hx    Rectal cancer Neg Hx    Stomach cancer Neg Hx      Atrial Fibrillation Management history:  Previous antiarrhythmic drugs: amiodarone, dofetilide  Previous cardioversions: 12/18/20 Previous ablations: 06/30/21, 01/09/22 CHADS2VASC score: 4 Anticoagulation history: Eliquis   Past Medical History:  Diagnosis Date   Allergic rhinitis    takes Allegra daily   Allergic rhinitis, cause unspecified 06/22/2012   Allergy    Arthritis    Atrial fibrillation ([place])    takes Tikosyn and Eliquis daily   Bilateral popliteal artery aneurysm (HCC)    Chronic kidney disease (CKD), stage III (moderate) (HCC)    Clotting disorder ([place])    Coronary artery disease    LAD stenting 2004 non-DES   Depression    took Zoloft 7yr ago but nothing now   Diverticulosis    DVT (deep venous thrombosis) ([place]    left popliteal artery greater than 10 years ago   Dysphagia    occasionally   History of blood clots 2003   left leg prior to fem pop   History of colon polyps    Hyperlipidemia    takes Tricor and  Lipitor daily   Insomnia    takes Ambien nightly as needed   Joint pain    Joint swelling    Neuromuscular disorder (HCC)    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   OSA (obstructive sleep apnea)    CPAP   Pacemaker    Peripheral vascular disease ([place])    Pleurisy    early 80's   Prostatitis    Sleep apnea    Urinary urgency    Past Surgical History:  Procedure Laterality Date   ATRIAL FIBRILLATION ABLATION N/A 06/30/2021   Procedure: ATRIAL FIBRILLATION ABLATION;  Surgeon: Thompson Grayer, MD;  Location: [facility];  Service: Cardiovascular;  Laterality: N/A;   ATRIAL FIBRILLATION ABLATION N/A 01/09/2022   Procedure: ATRIAL FIBRILLATION ABLATION;  Surgeon: Thompson Grayer, MD;  Location: [facility];  Service: Cardiovascular;  Laterality: N/A;   CARDIAC CATHETERIZATION  09/11/2002   with 2 stents   CARDIAC  CATHETERIZATION N/A 05/06/2015   Procedure: Left Heart Cath and Coronary Angiography;  Surgeon: Belva Crome, MD;  Location: [facility];  Service: Cardiovascular;  Laterality: N/A;   CARDIOVERSION N/A 12/18/2020   Procedure: CARDIOVERSION;  Surgeon: Josue Hector, MD;  Location: [facility];  Service: Cardiovascular;  Laterality: N/A;   COLONOSCOPY     hand and arm surgery Right    as a teenager   HAND SURGERY Left    INGUINAL HERNIA REPAIR Right    IR RADIOLOGIST EVAL & MGMT  01/05/2021   JOINT REPLACEMENT Left 12/09/2013   Left Hip    JOINT REPLACEMENT Right 05/05/2014   Right Hip   left knee surgery     PACEMAKER INSERTION  09/25/1996   due to bradycardia   PERMANENT PACEMAKER GENERATOR CHANGE N/A 06/17/2012   Procedure: PERMANENT PACEMAKER GENERATOR CHANGE;  Surgeon: Deboraha Sprang, MD;  Location: [facility];  Service: Cardiovascular;  Laterality: N/A;   right and left fem-pop bypass     SHOULDER ARTHROSCOPY WITH ROTATOR CUFF REPAIR Right 05/31/2021   Procedure: RIGHT SHOULDER ARTHROSCOPY ACROMIOPLASTY AND DEBRIDEMENT;  Surgeon: Melrose Nakayama, MD;  Location: [facility];  Service: Orthopedics;  Laterality: Right;   TOTAL HIP ARTHROPLASTY Left 12/09/2013   Procedure: TOTAL HIP ARTHROPLASTY ANTERIOR APPROACH;  Surgeon: Hessie Dibble, MD;  Location: [facility];  Service: Orthopedics;  Laterality: Left;  left anterior total hip arthroplasty   TOTAL HIP ARTHROPLASTY Right 05/05/2014   Procedure: TOTAL HIP ARTHROPLASTY ANTERIOR APPROACH;  Surgeon: Hessie Dibble, MD;  Location: [facility];  Service: Orthopedics;  Laterality: Right;   TURBINATE REDUCTION     wisdom extracted       Current Outpatient Medications  Medication Sig Dispense Refill   acetaminophen (TYLENOL) 500 MG tablet Take 500 mg by mouth every 6 (six) hours as needed (pain/headaches).     albuterol (PROVENTIL) (2.5 MG/3ML) 0.083% nebulizer solution Take 3 mLs (2.5 mg total) by nebulization every 6 (six) hours as needed for  wheezing or shortness of breath. 75 mL 12   apixaban (ELIQUIS) 5 MG TABS tablet TAKE 1 TABLET BY MOUTH  TWICE DAILY 180 tablet 1   atorvastatin (LIPITOR) 80 MG tablet TAKE 1 TABLET BY MOUTH  DAILY 90 tablet 3   carvedilol (COREG) 25 MG tablet TAKE 2 TABLETS BY MOUTH  TWICE DAILY 360 tablet 3   cholecalciferol (VITAMIN D) 25 MCG (1000 UNIT) tablet Take 1,000 Units by mouth in the morning, at noon, in the  evening, and at bedtime.     Coenzyme Q10 300 MG CAPS Take 300 mg by mouth in the morning.     Cyanocobalamin (VITAMIN B 12) 500 MCG TABS Take 500 mcg by mouth daily.     dofetilide (TIKOSYN) 500 MCG capsule Take 1 capsule (500 mcg total) by mouth 2 (two) times daily. 180 capsule 3   FARXIGA 10 MG TABS tablet TAKE 1 TABLET BY MOUTH DAILY  BEFORE BREAKFAST 90 tablet 3   fenofibrate 160 MG tablet TAKE 1 TABLET BY MOUTH  DAILY 90 tablet 3   fexofenadine (ALLEGRA) 180 MG tablet Take 90 mg by mouth 2 (two) times daily.     Fluocinonide Emulsified Base 0.05 % CREA APPLY TO AFFECTED AREA(S)  TWO TIMES DAILY AS NEEDED 60 g 2   folic acid (FOLVITE) 413 MCG tablet Take 400 mcg by mouth in the morning.     Lysine 500 MG TABS Take 500 mg by mouth 3 (three) times daily.     Magnesium 250 MG TABS Take 125 mg by mouth 2 (two) times daily.     pantoprazole (PROTONIX) 40 MG tablet Take 1 tablet (40 mg total) by mouth daily. 45 tablet 0   potassium chloride (KLOR-CON M) 10 MEQ tablet TAKE 1 TABLET BY MOUTH  TWICE DAILY 180 tablet 3   ranolazine (RANEXA) 500 MG 12 hr tablet TAKE 1 TABLET BY MOUTH  TWICE DAILY 180 tablet 3   triamcinolone (NASACORT) 55 MCG/ACT AERO nasal inhaler Place 1 spray into the nose in the morning.     No current facility-administered medications for this encounter.    Allergies  Allergen Reactions   Amiodarone Other (See Comments)    Peripheral neuropathy resulted   Niacin Other (See Comments)    Caused an irregular heartbeat   Ace Inhibitors Cough   Mometasone Furoate Other (See  Comments)    (Nasonex) Causes nasal bleeding and nose bleeds    Social History   Socioeconomic History   Marital status: Married    Spouse name: Not on file   Number of children: Not on file   Years of education: Not on file   Highest education level: Not on file  Occupational History   Not on file  Tobacco Use   Smoking status: Never    Passive exposure: Past   Smokeless tobacco: Never   Tobacco comments:    Never smoke 02/07/22  Vaping Use   Vaping Use: Never used  Substance and Sexual Activity   Alcohol use: No    Alcohol/week: 0.0 standard drinks    Comment: nothing since 2000   Drug use: No   Sexual activity: Yes  Other Topics Concern   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    Social Determinants of Health   Financial Resource Strain: Low Risk    Difficulty of Paying Living Expenses: Not hard at all  Food Insecurity: No Food Insecurity   Worried About Charity fundraiser in the Last Year: Never true   [place] in the Last Year: Never true  Transportation Needs: No Transportation Needs   Lack of Transportation (Medical): No   Lack of Transportation (Non-Medical): No  Physical Activity: Insufficiently Active   Days of Exercise per Week: 3 days   Minutes of Exercise per Session: 40 min  Stress: No Stress Concern Present   Feeling of Stress : Not at all  Social Connections: Moderately  Integrated   Frequency of Communication with Friends and Family: Three times a week   Frequency of Social Gatherings with Friends and Family: Three times a week   Attends Religious Services: Never   Active Member of Clubs or Organizations: Yes   Attends Music therapist: More than 4 times per year   Marital Status: Married  Human resources officer Violence: Not At Risk   Fear of Current or Ex-Partner: No   Emotionally Abused: No   Physically Abused: No   Sexually Abused: No     ROS- All systems are reviewed  and negative except as per the HPI above.  Physical Exam: Vitals:   02/07/22 1151  BP: 114/82  Pulse: 68  Weight: 87.2 kg  Height: '6\' 3"'$  (1.905 m)     GEN- The patient is a well appearing elderly male, alert and oriented x 3 today.   HEENT-head normocephalic, atraumatic, sclera clear, conjunctiva pink, hearing intact, trachea midline. Lungs- Clear to ausculation bilaterally, normal work of breathing Heart- Regular rate and rhythm, no murmurs, rubs or gallops  GI- soft, NT, ND, + BS Extremities- no clubbing, cyanosis, or edema MS- no significant deformity or atrophy Skin- no rash or lesion Psych- euthymic mood, full affect Neuro- strength and sensation are intact   Wt Readings from Last 3 Encounters:  02/07/22 87.2 kg  01/09/22 88.5 kg  12/28/21 92.8 kg    EKG today demonstrates  A paced rhythm, LBBB Vent. rate 68 BPM PR interval 212 ms QRS duration 136 ms QT/QTcB 488/518 ms  Echo 11/24/20 demonstrated   1. Left ventricular ejection fraction, by estimation, is 40 to 45%. The left ventricle has mildly decreased function. The left ventricle  demonstrates regional wall motion abnormalities. Abnormal (paradoxical) septal motion, consistent with left bundle  branch block. The left ventricular internal cavity size was mildly  dilated. Left ventricular diastolic parameters are consistent with Grade II diastolic dysfunction (pseudonormalization). Elevated left atrial pressure.   2. Right ventricular systolic function is normal. The right ventricular  size is moderately enlarged. There is normal pulmonary artery systolic pressure. The estimated right ventricular systolic pressure is 15.7 mmHg.   3. Left atrial size was severely dilated.   4. Right atrial size was mildly dilated.   5. The mitral valve is normal in structure. Mild mitral valve  regurgitation.   6. The aortic valve is tricuspid. Aortic valve regurgitation is not  visualized. Mild to moderate aortic valve  sclerosis/calcification is  present, without any evidence of aortic stenosis.   7. The inferior vena cava is normal in size with greater than 50%  respiratory variability, suggesting right atrial pressure of 3 mmHg.   Epic records are reviewed at length today  CHA2DS2-VASc Score = 4  The patient's score is based upon: CHF History: 1 HTN History: 0 Diabetes History: 0 Stroke History: 0 Vascular Disease History: 1 Age Score: 2 Gender Score: 0       ASSESSMENT AND PLAN: 1. Paroxysmal Atrial Fibrillation (ICD10:  I48.0) The patient's CHA2DS2-VASc score is 4, indicating a 4.8% annual risk of stroke.   S/p afib ablation 06/30/21 with repeat ablation 01/09/22 Continue Eliquis 5 mg BID with no missed doses for 3 months post ablation. Continue dofetilide 500 mcg BID. QT stable when accounting for LBBB. Continue carvedilol 25 mg BID Continue to monitor afib burden on device.   2. Secondary Hypercoagulable State (ICD10:  D68.69) The patient is at significant risk for stroke/thromboembolism based upon his CHA2DS2-VASc Score of 4.  Continue Apixaban (Eliquis).   3. CAD Continue Ranexa. (QT has been stable) No anginal symptoms.  4. Obstructive sleep apnea Encouraged compliance with CPAP therapy.  5. Chronic systolic dysfunction EF 03-70% Appears euvolemic today.  6. Symptomatic bradycardia S/p PPM, followed by Dr Caryl Comes and the device clinic.   Follow up with Dr Rayann Heman as scheduled.    [facility] [address] [place],  [address] [phone] 02/07/2022 11:57 AM

## 2022-03-26 ENCOUNTER — Encounter: Payer: Self-pay | Admitting: Internal Medicine

## 2022-04-04 ENCOUNTER — Encounter: Payer: Self-pay | Admitting: Pulmonary Disease

## 2022-04-04 ENCOUNTER — Ambulatory Visit: Payer: Medicare Other | Admitting: Pulmonary Disease

## 2022-04-04 VITALS — BP 100/60 | HR 71 | Temp 97.9°F | Ht 75.0 in | Wt 205.6 lb

## 2022-04-04 DIAGNOSIS — J849 Interstitial pulmonary disease, unspecified: Secondary | ICD-10-CM

## 2022-04-04 NOTE — Patient Instructions (Addendum)
We will follow up a CT Chest scan in January 2024  Pirfenidone and Nintedanib are the two anti-fibrotic therapies available for pulmonary fibrosis  Follow up in 6 months

## 2022-04-04 NOTE — Progress Notes (Signed)
Synopsis: Referred in December 2022 for ILD by Thompson Grayer, MD  Subjective:   PATIENT ID: [name] GENDER: male DOB: 01/25/1941, MRN: [id number]  HPI  Chief Complaint  Patient presents with   Follow-up    Follow-up    [name] is an 81 year old male, never smoker with atrial fibrillation, CKD III, CAD, OSA, DVT and PVD who returns to pulmonary clinic for interstitial lung disease.   He had atrial fibrillation ablation on 01/09/22 and has been feeling better since the procedure. He remains in sinus rhythm.   Inflammatory workup up from 12/29/21 is unremarkable.   We discussed treatment with anti-fibrotic medications, their efficacy and their side effect profiles.  OV 12/28/21 PFTs today show moderate diffusion defect. We discussed the correlation between the high resolution CT Chest scan and the PFT results. He has exertional dyspnea only when really pushing himself. Otherwise no daily limitations in his activities due to dyspnea. He is having a repeat atrial fibrillation ablation on May 1.   OV 09/07/21 Patient had CT cardiac scan on 06/23/2021 fibrotic changes at the bilateral lung bases and it was recommended he have a high-resolution CT chest scan for further evaluation.  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  He was also noted to have mild central cylindrical bronchiectasis and a 4 mm subpleural posterior left lower lobe nodule.  He denies any shortness of breath or limitations to his daily activities.  He denies any wheezing.  He does report a congested cough over the past 34 years.  The cough does not wake him up at night.  He feels like the cough is coming from deep in his chest and will produce whitish phlegm occasionally.  He reports he developed a cough since an episode of pleurisy in 1988.  He is followed by ENT for perforated septum due to cauterization procedures for epistaxis  in the past.  He is using Nasacort nasal spray daily.  He does have postnasal drainage.  He also reports dry nasal/sinus passages.  He has a history of obstructive sleep apnea and using his CPAP machine that is nearly 82 years old.  He does not use humidification.  He does not follow with a sleep medicine physician.  He does report occasional GERD symptoms.  He has reduced his caffeine intake to reduce the symptoms.  He is a never smoker but does report significant secondhand smoke exposure in childhood.  He is on her [place] family homes, a single-family home [place].  He denies any significant dust exposures over the years as he has mainly been on the distal side.  He denies any skin rashes or joint aches.  Past Medical History:  Diagnosis Date   Allergic rhinitis    takes Allegra daily   Allergic rhinitis, cause unspecified 06/22/2012   Allergy    Arthritis    Atrial fibrillation ([place])    takes Tikosyn and Eliquis daily   Bilateral popliteal artery aneurysm (HCC)    Chronic kidney disease (CKD), stage III (moderate) (HCC)    Clotting disorder (HCC)    Coronary artery disease    LAD stenting 2004 non-DES   Depression    took Zoloft 77yr ago but nothing now   Diverticulosis    DVT (deep venous thrombosis) (HCC)    left popliteal artery greater than 10 years ago   Dysphagia    occasionally   History  of blood clots 2003   left leg prior to fem pop   History of colon polyps    Hyperlipidemia    takes Tricor and Lipitor daily   Insomnia    takes Ambien nightly as needed   Joint pain    Joint swelling    Neuromuscular disorder (HCC)    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   OSA (obstructive sleep apnea)    CPAP   Pacemaker    Peripheral vascular disease (HCC)    Pleurisy    early 80's   Prostatitis    Sleep apnea    Urinary urgency      Family History  Problem Relation Age of Onset   Diabetes Mother    Heart disease Mother        Before  age 26   Heart disease Father        Before age 40   Heart attack Father    Stroke Father    Aneurysm Father        bilat pop art aneurysms   Peripheral vascular disease Father        Popliteal Aneurysm   Diabetes Other        family hx   Sleep apnea Other        family hx   Allergic rhinitis Neg Hx    Angioedema Neg Hx    Asthma Neg Hx    Eczema Neg Hx    Immunodeficiency Neg Hx    Urticaria Neg Hx    Colon cancer Neg Hx    Esophageal cancer Neg Hx    Prostate cancer Neg Hx    Rectal cancer Neg Hx    Stomach cancer Neg Hx      Social History   Socioeconomic History   Marital status: Married    Spouse name: Not on file   Number of children: Not on file   Years of education: Not on file   Highest education level: Not on file  Occupational History   Not on file  Tobacco Use   Smoking status: Never    Passive exposure: Past   Smokeless tobacco: Never   Tobacco comments:    Never smoke 02/07/22  Vaping Use   Vaping Use: Never used  Substance and Sexual Activity   Alcohol use: No    Alcohol/week: 0.0 standard drinks of alcohol    Comment: nothing since 2000   Drug use: No   Sexual activity: Yes  Other Topics Concern   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    Social Determinants of Health   Financial Resource Strain: Low Risk  (10/31/2021)   Overall Financial Resource Strain (CARDIA)    Difficulty of Paying Living Expenses: Not hard at all  Food Insecurity: No Food Insecurity (10/31/2021)   Hunger Vital Sign    Worried About Running Out of Food in the Last Year: Never true    Ran Out of Food in the Last Year: Never true  Transportation Needs: No Transportation Needs (10/31/2021)   PRAPARE - Hydrologist (Medical): No    Lack of Transportation (Non-Medical): No  Physical Activity: Insufficiently Active (10/31/2021)   Exercise Vital Sign    Days of Exercise per Week: 3 days     Minutes of Exercise per Session: 40 min  Stress: No Stress Concern Present (10/31/2021)   [employer]  Occupational Health - Occupational Stress Questionnaire    Feeling of Stress : Not at all  Social Connections: Moderately Integrated (10/31/2021)   Social Connection and Isolation Panel [NHANES]    Frequency of Communication with Friends and Family: Three times a week    Frequency of Social Gatherings with Friends and Family: Three times a week    Attends Religious Services: Never    Active Member of Clubs or Organizations: Yes    Attends Archivist Meetings: More than 4 times per year    Marital Status: Married  Human resources officer Violence: Not At Risk (10/31/2021)   Humiliation, Afraid, Rape, and Kick questionnaire    Fear of Current or Ex-Partner: No    Emotionally Abused: No    Physically Abused: No    Sexually Abused: No     Allergies  Allergen Reactions   Amiodarone Other (See Comments)    Peripheral neuropathy resulted   Niacin Other (See Comments)    Caused an irregular heartbeat   Ace Inhibitors Cough   Mometasone Furoate Other (See Comments)    (Nasonex) Causes nasal bleeding and nose bleeds     Outpatient Medications Prior to Visit  Medication Sig Dispense Refill   albuterol (PROVENTIL) (2.5 MG/3ML) 0.083% nebulizer solution Take 3 mLs (2.5 mg total) by nebulization every 6 (six) hours as needed for wheezing or shortness of breath. 75 mL 12   fexofenadine (ALLEGRA) 180 MG tablet Take 90 mg by mouth 2 (two) times daily.     triamcinolone (NASACORT) 55 MCG/ACT AERO nasal inhaler Place 1 spray into the nose in the morning.     acetaminophen (TYLENOL) 500 MG tablet Take 500 mg by mouth every 6 (six) hours as needed (pain/headaches).     apixaban (ELIQUIS) 5 MG TABS tablet TAKE 1 TABLET BY MOUTH  TWICE DAILY 180 tablet 1   atorvastatin (LIPITOR) 80 MG tablet TAKE 1 TABLET BY MOUTH  DAILY 90 tablet 3   carvedilol (COREG) 25 MG tablet TAKE 2 TABLETS BY MOUTH   TWICE DAILY 360 tablet 3   cholecalciferol (VITAMIN D) 25 MCG (1000 UNIT) tablet Take 1,000 Units by mouth in the morning, at noon, in the evening, and at bedtime.     Coenzyme Q10 300 MG CAPS Take 300 mg by mouth in the morning.     Cyanocobalamin (VITAMIN B 12) 500 MCG TABS Take 500 mcg by mouth daily.     dofetilide (TIKOSYN) 500 MCG capsule Take 1 capsule (500 mcg total) by mouth 2 (two) times daily. 180 capsule 3   FARXIGA 10 MG TABS tablet TAKE 1 TABLET BY MOUTH DAILY  BEFORE BREAKFAST 90 tablet 3   fenofibrate 160 MG tablet TAKE 1 TABLET BY MOUTH  DAILY 90 tablet 3   Fluocinonide Emulsified Base 0.05 % CREA APPLY TO AFFECTED AREA(S)  TWO TIMES DAILY AS NEEDED 60 g 2   folic acid (FOLVITE) 811 MCG tablet Take 400 mcg by mouth in the morning.     Lysine 500 MG TABS Take 500 mg by mouth 3 (three) times daily.     Magnesium 250 MG TABS Take 125 mg by mouth 2 (two) times daily.     pantoprazole (PROTONIX) 40 MG tablet Take 1 tablet (40 mg total) by mouth daily. 45 tablet 0   potassium chloride (KLOR-CON M) 10 MEQ tablet TAKE 1 TABLET BY MOUTH  TWICE DAILY 180 tablet 3   ranolazine (RANEXA) 500 MG 12 hr tablet TAKE 1 TABLET BY MOUTH  TWICE DAILY 180 tablet 3   No facility-administered medications prior to visit.   Review of Systems  Constitutional:  Negative for chills, fever, malaise/fatigue and weight loss.  HENT:  Negative for congestion, sinus pain and sore throat.   Eyes: Negative.   Respiratory:  Negative for cough, hemoptysis, sputum production, shortness of breath and wheezing.   Cardiovascular:  Negative for chest pain, palpitations, orthopnea, claudication and leg swelling.  Gastrointestinal:  Negative for abdominal pain, heartburn, nausea and vomiting.  Genitourinary: Negative.   Musculoskeletal:  Negative for joint pain and myalgias.  Skin:  Negative for rash.  Neurological:  Negative for weakness.  Endo/Heme/Allergies: Negative.   Psychiatric/Behavioral: Negative.       Objective:   Vitals:   04/04/22 1359  BP: 100/60  Pulse: 71  Temp: 97.9 F (36.6 C)  TempSrc: Oral  SpO2: 96%  Weight: 205 lb 9.6 oz (93.3 kg)  Height: '6\' 3"'$  (1.905 m)    Physical Exam Constitutional:      General: He is not in acute distress. HENT:     Head: Normocephalic and atraumatic.  Eyes:     Conjunctiva/sclera: Conjunctivae normal.  Cardiovascular:     Rate and Rhythm: Normal rate and regular rhythm.     Pulses: Normal pulses.     Heart sounds: Normal heart sounds. No murmur heard. Pulmonary:     Effort: Pulmonary effort is normal.     Breath sounds: Rales (faint, bibasilar) present.  Musculoskeletal:     Right lower leg: No edema.     Left lower leg: No edema.  Skin:    General: Skin is warm and dry.  Neurological:     General: No focal deficit present.     Mental Status: He is alert.  Psychiatric:        Mood and Affect: Mood normal.        Behavior: Behavior normal.        Thought Content: Thought content normal.        Judgment: Judgment normal.    CBC    Component Value Date/Time   WBC 5.4 12/15/2021 1533   WBC 4.3 05/10/2021 1424   RBC 4.30 12/15/2021 1533   RBC 4.15 (L) 05/10/2021 1424   HGB 14.1 12/15/2021 1533   HCT 41.6 12/15/2021 1533   PLT 129 (L) 12/15/2021 1533   MCV 97 12/15/2021 1533   MCH 32.8 12/15/2021 1533   MCH 33.5 01/16/2021 1745   MCHC 33.9 12/15/2021 1533   MCHC 33.2 05/10/2021 1424   RDW 12.4 12/15/2021 1533   LYMPHSABS 1.2 12/15/2021 1533   MONOABS 0.4 05/10/2021 1424   EOSABS 0.2 12/15/2021 1533   BASOSABS 0.0 12/15/2021 1533      Latest Ref Rng & Units 12/15/2021    3:33 PM 06/06/2021   10:42 AM 05/10/2021    2:24 PM  BMP  Glucose 70 - 99 mg/dL 80  86  108   BUN 8 - 27 mg/dL '21  17  19   '$ Creatinine 0.76 - 1.27 mg/dL 1.37  1.18  1.13   BUN/Creat Ratio 10 - '24 15  14    '$ Sodium 134 - 144 mmol/L 145  141  141   Potassium 3.5 - 5.2 mmol/L 4.6  4.7  4.1   Chloride 96 - 106 mmol/L 108  105  107   CO2 20 - 29  mmol/L '20  24  28   '$ Calcium 8.6 - 10.2 mg/dL 9.8  9.7  9.5  Chest imaging: HRCT Chest 08/11/21 1. Pulmonary parenchymal pattern of fibrosis may be due to usual interstitial pneumonitis or nonspecific interstitial pneumonitis. Findings are categorized as probable UIP per consensus guidelines: Diagnosis of Idiopathic Pulmonary Fibrosis: An Official ATS/ERS/JRS/ALAT Clinical Practice Guideline. [place], Iss 5, [phone], May 12 2017. 2. Small pericardial effusion. 3. Mild central cylindrical bronchiectasis. 4. 4 mm subpleural posterior left lower lobe nodule. No follow-up needed if patient is low-risk. Non-contrast chest CT can be considered in 12 months if patient is high-risk. This recommendation follows the consensus statement: Guidelines for Management of Incidental Pulmonary Nodules Detected on CT Images: From the Fleischner Society 2017; Radiology 2017; 284:228-243. 5. Cirrhosis. 6. Aortic atherosclerosis (ICD10-I70.0). Coronary artery calcification.  PFT:    Latest Ref Rng & Units 12/28/2021    3:00 PM  PFT Results  FVC-Pre L 4.44   FVC-Predicted Pre % 92   FVC-Post L 4.40   FVC-Predicted Post % 91   Pre FEV1/FVC % % 75   Post FEV1/FCV % % 74   FEV1-Pre L 3.32   FEV1-Predicted Pre % 96   FEV1-Post L 3.24   DLCO uncorrected ml/min/mmHg 15.84   DLCO UNC% % 56   DLCO corrected ml/min/mmHg 16.07   DLCO COR %Predicted % 57   DLVA Predicted % 73   TLC L 7.05   TLC % Predicted % 87   RV % Predicted % 92   12/28/21: Mild diffusion defect.  Labs:  Path:  Echo 11/2020: LV EF 40-45%. Abnormal septal motion, consistent with left bundle branch block. Grade II diastolic dysfunction. RV systolic pressure is normal. RV size is moderately enlarged. LA severely dilated. RA mildly dilated.   Heart Catheterization:  Assessment & Plan:   ILD (interstitial lung disease) ([place]) - Plan: CT CHEST HIGH RESOLUTION  Discussion: [name] is an 81 year  old male, never smoker with atrial fibrillation, CKD III, CAD, OSA, DVT and PVD who returns to pulmonary clinic for interstitial lung disease.   Based on high-resolution CT chest scan he has probable UIP pattern concerning for idiopathic pulmonary fibrosis. His pulmonary function tests show moderate diffusion defect. He remains very functional from a respiratory standpoint and does not have a high burden of symptoms at this time. His inflammatory workup is unremarkable. 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  We discussed the significant side effect profiles of anti-fibrotic medications. Given his lack of clinical symptoms, we will continue to monitor patient closely with follow up CT chest scan in January 2024.  Follow up in 6 months after HRCT Chest scan  Freda Jackson, MD [facility] Office: [phone]   Current Outpatient Medications:    albuterol (PROVENTIL) (2.5 MG/3ML) 0.083% nebulizer solution, Take 3 mLs (2.5 mg total) by nebulization every 6 (six) hours as needed for wheezing or shortness of breath., Disp: 75 mL, Rfl: 12   fexofenadine (ALLEGRA) 180 MG tablet, Take 90 mg by mouth 2 (two) times daily., Disp: , Rfl:    triamcinolone (NASACORT) 55 MCG/ACT AERO nasal inhaler, Place 1 spray into the nose in the morning., Disp: , Rfl:    acetaminophen (TYLENOL) 500 MG tablet, Take 500 mg by mouth every 6 (six) hours as needed (pain/headaches)., Disp: , Rfl:    apixaban (ELIQUIS) 5 MG TABS tablet, TAKE 1 TABLET BY MOUTH  TWICE DAILY, Disp: 180 tablet, Rfl: 1   atorvastatin (LIPITOR) 80 MG tablet, TAKE 1 TABLET BY MOUTH  DAILY, Disp: 90 tablet, Rfl: 3   carvedilol (COREG) 25 MG tablet, TAKE 2 TABLETS BY MOUTH  TWICE DAILY, Disp: 360 tablet, Rfl: 3   cholecalciferol (VITAMIN D) 25 MCG (1000 UNIT) tablet, Take 1,000 Units by mouth in the morning, at noon, in the evening, and at bedtime., Disp: , Rfl:    Coenzyme Q10 300 MG CAPS, Take 300 mg  by mouth in the morning., Disp: , Rfl:    Cyanocobalamin (VITAMIN B 12) 500 MCG TABS, Take 500 mcg by mouth daily., Disp: , Rfl:    dofetilide (TIKOSYN) 500 MCG capsule, Take 1 capsule (500 mcg total) by mouth 2 (two) times daily., Disp: 180 capsule, Rfl: 3   FARXIGA 10 MG TABS tablet, TAKE 1 TABLET BY MOUTH DAILY  BEFORE BREAKFAST, Disp: 90 tablet, Rfl: 3   fenofibrate 160 MG tablet, TAKE 1 TABLET BY MOUTH  DAILY, Disp: 90 tablet, Rfl: 3   Fluocinonide Emulsified Base 0.05 % CREA, APPLY TO AFFECTED AREA(S)  TWO TIMES DAILY AS NEEDED, Disp: 60 g, Rfl: 2   folic acid (FOLVITE) 742 MCG tablet, Take 400 mcg by mouth in the morning., Disp: , Rfl:    Lysine 500 MG TABS, Take 500 mg by mouth 3 (three) times daily., Disp: , Rfl:    Magnesium 250 MG TABS, Take 125 mg by mouth 2 (two) times daily., Disp: , Rfl:    pantoprazole (PROTONIX) 40 MG tablet, Take 1 tablet (40 mg total) by mouth daily., Disp: 45 tablet, Rfl: 0   potassium chloride (KLOR-CON M) 10 MEQ tablet, TAKE 1 TABLET BY MOUTH  TWICE DAILY, Disp: 180 tablet, Rfl: 3   ranolazine (RANEXA) 500 MG 12 hr tablet, TAKE 1 TABLET BY MOUTH  TWICE DAILY, Disp: 180 tablet, Rfl: 3

## 2022-04-08 ENCOUNTER — Other Ambulatory Visit: Payer: Self-pay | Admitting: Internal Medicine

## 2022-04-08 DIAGNOSIS — I480 Paroxysmal atrial fibrillation: Secondary | ICD-10-CM

## 2022-04-10 NOTE — Telephone Encounter (Signed)
Eliquis '5mg'$  refill request received. Patient is 81 years old, weight-93.3kg, Crea-1.37 on 12/15/2021, Diagnosis-Afib, and last seen by Malka So, PA on 02/07/2022 & pending appt with Dr. Rayann Heman in August 2023. Dose is appropriate based on dosing criteria. Will send in refill to requested pharmacy.

## 2022-04-28 ENCOUNTER — Encounter: Payer: Self-pay | Admitting: Internal Medicine

## 2022-04-28 ENCOUNTER — Ambulatory Visit (INDEPENDENT_AMBULATORY_CARE_PROVIDER_SITE_OTHER): Payer: Medicare Other | Admitting: Internal Medicine

## 2022-04-28 VITALS — BP 114/68 | HR 68 | Ht 75.0 in | Wt 207.0 lb

## 2022-04-28 DIAGNOSIS — I480 Paroxysmal atrial fibrillation: Secondary | ICD-10-CM

## 2022-04-28 DIAGNOSIS — I495 Sick sinus syndrome: Secondary | ICD-10-CM | POA: Diagnosis not present

## 2022-04-28 DIAGNOSIS — I5042 Chronic combined systolic (congestive) and diastolic (congestive) heart failure: Secondary | ICD-10-CM

## 2022-04-28 DIAGNOSIS — Z950 Presence of cardiac pacemaker: Secondary | ICD-10-CM | POA: Diagnosis not present

## 2022-04-28 NOTE — Patient Instructions (Addendum)
Medication Instructions:  Your physician has recommended you make the following change in your medication:   STOP TAKING YOUR RANOLAZINE ( RANEXA 500 MG )   Lab Work: None ordered.  If you have labs (blood work) drawn today and your tests are completely normal, you will receive your results only by: [place] (if you have MyChart) OR A paper copy in the mail If you have any lab test that is abnormal or we need to change your treatment, we will call you to review the results.  Testing/Procedures: PLEASE SCHEDULE AN ECHOCARDIOGRAM "NEXT AVAILABLE APPOINTMENT" FOR PATIENT.   Follow-Up:  IN 6 MONTHS with DR. Olin Pia.      Important Information About Sugar

## 2022-04-28 NOTE — Progress Notes (Signed)
PCP: Biagio Borg, MD Primary Cardiologist: Dr Marisue Ivan Primary EP:  Dr Leonides Sake is a 81 y.o. male who presents today for routine electrophysiology followup.  Since last being seen in our clinic, the patient reports doing very well.  Today, he denies symptoms of palpitations, chest pain, shortness of breath,  lower extremity edema, dizziness, presyncope, or syncope.  The patient is otherwise without complaint today.   Past Medical History:  Diagnosis Date   Allergic rhinitis    takes Allegra daily   Allergic rhinitis, cause unspecified 06/22/2012   Allergy    Arthritis    Atrial fibrillation ([place])    takes Tikosyn and Eliquis daily   Bilateral popliteal artery aneurysm (HCC)    Chronic kidney disease (CKD), stage III (moderate) (HCC)    Clotting disorder (HCC)    Coronary artery disease    LAD stenting 2004 non-DES   Depression    took Zoloft 69yr ago but nothing now   Diverticulosis    DVT (deep venous thrombosis) (HCC)    left popliteal artery greater than 10 years ago   Dysphagia    occasionally   History of blood clots 2003   left leg prior to fem pop   History of colon polyps    Hyperlipidemia    takes Tricor and Lipitor daily   Insomnia    takes Ambien nightly as needed   Joint pain    Joint swelling    Neuromuscular disorder (HCC)    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   OSA (obstructive sleep apnea)    CPAP   Pacemaker    Peripheral vascular disease ([place]    Pleurisy    early 80's   Prostatitis    Sleep apnea    Urinary urgency    Past Surgical History:  Procedure Laterality Date   ATRIAL FIBRILLATION ABLATION N/A 06/30/2021   Procedure: ATRIAL FIBRILLATION ABLATION;  Surgeon: AThompson Grayer MD;  [facility];  Service: Cardiovascular;  Laterality: N/A;   ATRIAL FIBRILLATION ABLATION N/A 01/09/2022   Procedure: ATRIAL FIBRILLATION ABLATION;  Surgeon: AThompson Grayer MD;  [facility];   Service: Cardiovascular;  Laterality: N/A;   CARDIAC CATHETERIZATION  09/11/2002   with 2 stents   CARDIAC CATHETERIZATION N/A 05/06/2015   Procedure: Left Heart Cath and Coronary Angiography;  Surgeon: HBelva Crome MD;  [facility];  Service: Cardiovascular;  Laterality: N/A;   CARDIOVERSION N/A 12/18/2020   Procedure: CARDIOVERSION;  Surgeon: NJosue Hector MD;  [facility]  Service: Cardiovascular;  Laterality: N/A;   COLONOSCOPY     hand and arm surgery Right    as a teenager   HAND SURGERY Left    INGUINAL HERNIA REPAIR Right    IR RADIOLOGIST EVAL & MGMT  01/05/2021   JOINT REPLACEMENT Left 12/09/2013   Left Hip    JOINT REPLACEMENT Right 05/05/2014   Right Hip   left knee surgery     PACEMAKER INSERTION  09/25/1996   due to bradycardia   PERMANENT PACEMAKER GENERATOR CHANGE N/A 06/17/2012   Procedure: PERMANENT PACEMAKER GENERATOR CHANGE;  Surgeon: SDeboraha Sprang MD;  [facility];  Service: Cardiovascular;  Laterality: N/A;   right and left fem-pop bypass     SHOULDER ARTHROSCOPY WITH ROTATOR CUFF REPAIR Right 05/31/2021   Procedure: RIGHT SHOULDER ARTHROSCOPY ACROMIOPLASTY AND DEBRIDEMENT;  Surgeon: DMelrose Nakayama MD;  Location:  [facility];  Service: Orthopedics;  Laterality: Right;   TOTAL HIP ARTHROPLASTY Left 12/09/2013   Procedure: TOTAL HIP ARTHROPLASTY ANTERIOR APPROACH;  Surgeon: Hessie Dibble, MD;  Location: [place];  Service: Orthopedics;  Laterality: Left;  left anterior total hip arthroplasty   TOTAL HIP ARTHROPLASTY Right 05/05/2014   Procedure: TOTAL HIP ARTHROPLASTY ANTERIOR APPROACH;  Surgeon: Hessie Dibble, MD;  Location: [place];  Service: Orthopedics;  Laterality: Right;   TURBINATE REDUCTION     wisdom extracted       ROS- all systems are reviewed and negative except as per HPI above  Current Outpatient Medications  Medication Sig Dispense Refill   acetaminophen (TYLENOL) 500 MG tablet Take 500 mg by mouth every 6  (six) hours as needed (pain/headaches).     albuterol (PROVENTIL) (2.5 MG/3ML) 0.083% nebulizer solution Take 3 mLs (2.5 mg total) by nebulization every 6 (six) hours as needed for wheezing or shortness of breath. 75 mL 12   apixaban (ELIQUIS) 5 MG TABS tablet TAKE 1 TABLET BY MOUTH TWICE  DAILY 180 tablet 2   atorvastatin (LIPITOR) 80 MG tablet TAKE 1 TABLET BY MOUTH  DAILY 90 tablet 3   carvedilol (COREG) 25 MG tablet TAKE 2 TABLETS BY MOUTH  TWICE DAILY 360 tablet 3   cholecalciferol (VITAMIN D) 25 MCG (1000 UNIT) tablet Take 1,000 Units by mouth in the morning, at noon, in the evening, and at bedtime.     Coenzyme Q10 300 MG CAPS Take 300 mg by mouth in the morning.     Cyanocobalamin (VITAMIN B 12) 500 MCG TABS Take 500 mcg by mouth daily.     dofetilide (TIKOSYN) 500 MCG capsule Take 1 capsule (500 mcg total) by mouth 2 (two) times daily. 180 capsule 3   fenofibrate 160 MG tablet TAKE 1 TABLET BY MOUTH  DAILY 90 tablet 3   fexofenadine (ALLEGRA) 180 MG tablet Take 90 mg by mouth 2 (two) times daily.     Fluocinonide Emulsified Base 0.05 % CREA APPLY TO AFFECTED AREA(S)  TWO TIMES DAILY AS NEEDED 60 g 2   folic acid (FOLVITE) 027 MCG tablet Take 400 mcg by mouth in the morning.     Lysine 500 MG TABS Take 500 mg by mouth 3 (three) times daily.     Magnesium 250 MG TABS Take 125 mg by mouth 2 (two) times daily.     potassium chloride (KLOR-CON M) 10 MEQ tablet TAKE 1 TABLET BY MOUTH  TWICE DAILY 180 tablet 3   ranolazine (RANEXA) 500 MG 12 hr tablet TAKE 1 TABLET BY MOUTH  TWICE DAILY 180 tablet 3   triamcinolone (NASACORT) 55 MCG/ACT AERO nasal inhaler Place 1 spray into the nose in the morning.     pantoprazole (PROTONIX) 40 MG tablet Take 1 tablet (40 mg total) by mouth daily. 45 tablet 0   No current facility-administered medications for this visit.    Physical Exam: Vitals:   04/28/22 1134  BP: 114/68  Pulse: 68  SpO2: 98%  Weight: 207 lb (93.9 kg)  Height: '6\' 3"'$  (1.905 m)     GEN- The patient is well appearing, alert and oriented x 3 today.   Head- normocephalic, atraumatic Eyes-  Sclera clear, conjunctiva pink Ears- hearing intact Oropharynx- clear Lungs- Clear to ausculation bilaterally, normal work of breathing Chest- pacemaker pocket is well healed Heart- Regular rate and rhythm, no murmurs, rubs or gallops, PMI not laterally displaced GI- soft, NT, ND, + BS Extremities- no  clubbing, cyanosis, or edema  Pacemaker interrogation- reviewed in detail today,  See PACEART report    Assessment and Plan:  1. Symptomatic sinus bradycardia  Normal pacemaker function See Pace Art report No changes today he is not device dependant today  2. Paroxysmal atrial fibrillation Well controlled post ablation He did have some ERAF but appears much better now.  Stop ranolazine today Consider stopping tikosyn if AF remains controlled labs 4/23 reviewed He wishes to defer further labs to his appointment with Dr Jenny Reichmann.  He will need bmet, mg then.  3. Chronic systolic dysfunction Stable No change required today Repeat echo He wishes to consider switching from coreg to nebivolol.  I have advised that he discuss with Dr Caryl Comes on return.    4. OSA Compliance with CPAP advised  Risks, benefits and potential toxicities for medications prescribed and/or refilled reviewed with patient today.   Return to see DR Caryl Comes in 6 months  Thompson Grayer MD, [facility] 04/28/2022 12:05 PM

## 2022-05-11 ENCOUNTER — Ambulatory Visit (HOSPITAL_COMMUNITY): Payer: Medicare Other | Attending: Internal Medicine

## 2022-05-11 DIAGNOSIS — I480 Paroxysmal atrial fibrillation: Secondary | ICD-10-CM | POA: Diagnosis not present

## 2022-05-11 DIAGNOSIS — I5042 Chronic combined systolic (congestive) and diastolic (congestive) heart failure: Secondary | ICD-10-CM | POA: Insufficient documentation

## 2022-05-11 DIAGNOSIS — I495 Sick sinus syndrome: Secondary | ICD-10-CM | POA: Diagnosis not present

## 2022-05-11 DIAGNOSIS — Z950 Presence of cardiac pacemaker: Secondary | ICD-10-CM | POA: Insufficient documentation

## 2022-05-11 LAB — ECHOCARDIOGRAM COMPLETE
AR max vel: 1.47 cm2
AV Area VTI: 1.47 cm2
AV Area mean vel: 1.38 cm2
AV Mean grad: 8.8 mmHg
AV Peak grad: 14.4 mmHg
Ao pk vel: 1.9 m/s
Area-P 1/2: 4.21 cm2
S' Lateral: 3.75 cm

## 2022-05-16 ENCOUNTER — Ambulatory Visit (INDEPENDENT_AMBULATORY_CARE_PROVIDER_SITE_OTHER): Payer: Medicare Other | Admitting: Internal Medicine

## 2022-05-16 VITALS — BP 118/62 | HR 74 | Temp 98.3°F | Ht 75.0 in | Wt 209.0 lb

## 2022-05-16 DIAGNOSIS — Z0001 Encounter for general adult medical examination with abnormal findings: Secondary | ICD-10-CM | POA: Diagnosis not present

## 2022-05-16 DIAGNOSIS — E7800 Pure hypercholesterolemia, unspecified: Secondary | ICD-10-CM

## 2022-05-16 DIAGNOSIS — N1831 Chronic kidney disease, stage 3a: Secondary | ICD-10-CM | POA: Diagnosis not present

## 2022-05-16 DIAGNOSIS — E538 Deficiency of other specified B group vitamins: Secondary | ICD-10-CM

## 2022-05-16 DIAGNOSIS — R739 Hyperglycemia, unspecified: Secondary | ICD-10-CM | POA: Diagnosis not present

## 2022-05-16 DIAGNOSIS — E559 Vitamin D deficiency, unspecified: Secondary | ICD-10-CM | POA: Diagnosis not present

## 2022-05-16 LAB — URINALYSIS, ROUTINE W REFLEX MICROSCOPIC
Bilirubin Urine: NEGATIVE
Hgb urine dipstick: NEGATIVE
Ketones, ur: NEGATIVE
Leukocytes,Ua: NEGATIVE
Nitrite: NEGATIVE
RBC / HPF: NONE SEEN (ref 0–?)
Specific Gravity, Urine: 1.025 (ref 1.000–1.030)
Total Protein, Urine: NEGATIVE
Urine Glucose: NEGATIVE
Urobilinogen, UA: 0.2 (ref 0.0–1.0)
pH: 6 (ref 5.0–8.0)

## 2022-05-16 LAB — BASIC METABOLIC PANEL
BUN: 18 mg/dL (ref 6–23)
CO2: 30 mEq/L (ref 19–32)
Calcium: 9.8 mg/dL (ref 8.4–10.5)
Chloride: 109 mEq/L (ref 96–112)
Creatinine, Ser: 1.11 mg/dL (ref 0.40–1.50)
GFR: 61.78 mL/min (ref >60.00)
Glucose, Bld: 121 mg/dL — ABNORMAL HIGH (ref 70–99)
Potassium: 4.9 mEq/L (ref 3.5–5.1)
Sodium: 145 mEq/L (ref 135–145)

## 2022-05-16 LAB — CBC WITH DIFFERENTIAL/PLATELET
Basophils Absolute: 0 10*3/uL (ref 0.0–0.1)
Basophils Relative: 0.9 % (ref 0.0–3.0)
Eosinophils Absolute: 0.2 10*3/uL (ref 0.0–0.7)
Eosinophils Relative: 4.7 % (ref 0.0–5.0)
HCT: 37.9 % — ABNORMAL LOW (ref 39.0–52.0)
Hemoglobin: 12.8 g/dL — ABNORMAL LOW (ref 13.0–17.0)
Lymphocytes Relative: 23.5 % (ref 12.0–46.0)
Lymphs Abs: 1.1 10*3/uL (ref 0.7–4.0)
MCHC: 33.7 g/dL (ref 30.0–36.0)
MCV: 99.8 fl (ref 78.0–100.0)
Monocytes Absolute: 0.5 10*3/uL (ref 0.1–1.0)
Monocytes Relative: 9.8 % (ref 3.0–12.0)
Neutro Abs: 2.9 10*3/uL (ref 1.4–7.7)
Neutrophils Relative %: 61.1 % (ref 43.0–77.0)
Platelets: 136 10*3/uL — ABNORMAL LOW (ref 150.0–400.0)
RBC: 3.8 Mil/uL — ABNORMAL LOW (ref 4.22–5.81)
RDW: 14.6 % (ref 11.5–15.5)
WBC: 4.8 10*3/uL (ref 4.0–10.5)

## 2022-05-16 LAB — LIPID PANEL
Cholesterol: 98 mg/dL (ref 0–200)
HDL: 36.7 mg/dL — ABNORMAL LOW (ref >39.00)
LDL Cholesterol: 48 mg/dL (ref 0–99)
NonHDL: 61.58
Total CHOL/HDL Ratio: 3
Triglycerides: 67 mg/dL (ref 0.0–149.0)
VLDL: 13.4 mg/dL (ref 0.0–40.0)

## 2022-05-16 LAB — HEPATIC FUNCTION PANEL
ALT: 21 U/L (ref 0–53)
AST: 27 U/L (ref 0–37)
Albumin: 4 g/dL (ref 3.5–5.2)
Alkaline Phosphatase: 38 U/L — ABNORMAL LOW (ref 39–117)
Bilirubin, Direct: 0.2 mg/dL (ref 0.0–0.3)
Total Bilirubin: 0.8 mg/dL (ref 0.2–1.2)
Total Protein: 6.5 g/dL (ref 6.0–8.3)

## 2022-05-16 LAB — VITAMIN D 25 HYDROXY (VIT D DEFICIENCY, FRACTURES): VITD: 41.14 ng/mL (ref 30.00–100.00)

## 2022-05-16 LAB — VITAMIN B12: Vitamin B-12: 590 pg/mL (ref 211–911)

## 2022-05-16 LAB — TSH: TSH: 1.76 u[IU]/mL (ref 0.35–5.50)

## 2022-05-16 LAB — HEMOGLOBIN A1C: Hgb A1c MFr Bld: 5.6 % (ref 4.6–6.5)

## 2022-05-16 NOTE — Patient Instructions (Signed)

## 2022-05-16 NOTE — Progress Notes (Unsigned)
Patient ID: [name], male   DOB: 10-08-1940, 81 y.o.   MRN: [id number]         Chief Complaint:: wellness exam and Physical  (Discuss EGFR)  ***       HPI:  [name] is a 81 y.o. male here for wellness exam                        Also second ablation seems to be holding per pt - no afib for 2.5 months, has f/u with Dr Caryl Comes early 2024.  [place] last yr requirin right rot cuff surgury.  S/p diverticular abscess 10 day hospn last yr.  More fiber in diet now.  Sees vascular surgury yearly.    Sees pulm for the IPF overall feels no change in doe.  Did stop potassium as he became concerned whether really needed.     Wt Readings from Last 3 Encounters:  05/16/22 209 lb (94.8 kg)  04/28/22 207 lb (93.9 kg)  04/04/22 205 lb 9.6 oz (93.3 kg)   BP Readings from Last 3 Encounters:  05/16/22 118/62  04/28/22 114/68  04/04/22 100/60   Immunization History  Administered Date(s) Administered   Fluad Quad(high Dose 65+) 06/25/2020, 05/10/2021   H1N1 08/11/2008   Influenza Split 06/21/2012   Influenza Whole 08/20/2008, 05/31/2010, 05/04/2013   Influenza, High Dose Seasonal PF 06/15/2014, 06/29/2018, 06/15/2019   Influenza-Unspecified 05/18/2015   PFIZER(Purple Top)SARS-COV-2 Vaccination 09/21/2019, 10/11/2019, 06/08/2020, 01/05/2021   Pneumococcal Conjugate-13 04/07/2015   Pneumococcal Polysaccharide-23 09/11/2005   Td 02/02/2009   Tdap 06/25/2020   Zoster Recombinat (Shingrix) 02/02/2017, 06/01/2017   Zoster, Live 03/05/2007  There are no preventive care reminders to display for this patient.    Past Medical History:  Diagnosis Date   Allergic rhinitis    takes Allegra daily   Allergic rhinitis, cause unspecified 06/22/2012   Allergy    Arthritis    Atrial fibrillation ([place])    takes Tikosyn and Eliquis daily   Bilateral popliteal artery aneurysm (HCC)    Chronic kidney disease (CKD), stage III (moderate) (HCC)    Clotting disorder (HCC)    Coronary artery disease    LAD  stenting 2004 non-DES   Depression    took Zoloft 48yrs ago but nothing now   Diverticulosis    DVT (deep venous thrombosis) (HCC)    left popliteal artery greater than 10 years ago   Dysphagia    occasionally   History of blood clots 2003   left leg prior to fem pop   History of colon polyps    Hyperlipidemia    takes Tricor and Lipitor daily   Insomnia    takes Ambien nightly as needed   Joint pain    Joint swelling    Neuromuscular disorder (HCC)    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   OSA (obstructive sleep apnea)    CPAP   Pacemaker    Peripheral vascular disease ([place])    Pleurisy    early 80's   Prostatitis    Sleep apnea    Urinary urgency    Past Surgical History:  Procedure Laterality Date   ATRIAL FIBRILLATION ABLATION N/A 06/30/2021   Procedure: ATRIAL FIBRILLATION ABLATION;  Surgeon: Thompson Grayer, MD;  Location: [place] [facility];  Service: Cardiovascular;  Laterality: N/A;   ATRIAL FIBRILLATION ABLATION N/A 01/09/2022   Procedure: ATRIAL FIBRILLATION ABLATION;  Surgeon: Thompson Grayer, MD;  Location:  [place] [facility];  Service: Cardiovascular;  Laterality: N/A;   CARDIAC CATHETERIZATION  09/11/2002   with 2 stents   CARDIAC CATHETERIZATION N/A 05/06/2015   Procedure: Left Heart Cath and Coronary Angiography;  Surgeon: Belva Crome, MD;  Location: [place] [facility];  Service: Cardiovascular;  Laterality: N/A;   CARDIOVERSION N/A 12/18/2020   Procedure: CARDIOVERSION;  Surgeon: Josue Hector, MD;  Location: [place];  Service: Cardiovascular;  Laterality: N/A;   COLONOSCOPY     hand and arm surgery Right    as a teenager   HAND SURGERY Left    INGUINAL HERNIA REPAIR Right    IR RADIOLOGIST EVAL & MGMT  01/05/2021   JOINT REPLACEMENT Left 12/09/2013   Left Hip    JOINT REPLACEMENT Right 05/05/2014   Right Hip   left knee surgery     PACEMAKER INSERTION  09/25/1996   due to bradycardia   PERMANENT PACEMAKER GENERATOR CHANGE N/A  06/17/2012   Procedure: PERMANENT PACEMAKER GENERATOR CHANGE;  Surgeon: Deboraha Sprang, MD;  Location: [facility];  Service: Cardiovascular;  Laterality: N/A;   right and left fem-pop bypass     SHOULDER ARTHROSCOPY WITH ROTATOR CUFF REPAIR Right 05/31/2021   Procedure: RIGHT SHOULDER ARTHROSCOPY ACROMIOPLASTY AND DEBRIDEMENT;  Surgeon: Melrose Nakayama, MD;  Location[facility];  Service: Orthopedics;  Laterality: Right;   TOTAL HIP ARTHROPLASTY Left 12/09/2013   Procedure: TOTAL HIP ARTHROPLASTY ANTERIOR APPROACH;  Surgeon: Hessie Dibble, MD;  Location: [place];  Service: Orthopedics;  Laterality: Left;  left anterior total hip arthroplasty   TOTAL HIP ARTHROPLASTY Right 05/05/2014   Procedure: TOTAL HIP ARTHROPLASTY ANTERIOR APPROACH;  Surgeon: Hessie Dibble, MD;  Location: [place];  Service: Orthopedics;  Laterality: Right;   TURBINATE REDUCTION     wisdom extracted       reports that he has never smoked. He has been exposed to tobacco smoke. He has never used smokeless tobacco. He reports that he does not drink alcohol and does not use drugs. family history includes Aneurysm in his father; Diabetes in his mother and another family member; Heart attack in his father; Heart disease in his father and mother; Peripheral vascular disease in his father; Sleep apnea in an other family member; Stroke in his father. Allergies  Allergen Reactions   Amiodarone Other (See Comments)    Peripheral neuropathy resulted   Niacin Other (See Comments)    Caused an irregular heartbeat   Ace Inhibitors Cough   Mometasone Furoate Other (See Comments)    (Nasonex) Causes nasal bleeding and nose bleeds   Current Outpatient Medications on File Prior to Visit  Medication Sig Dispense Refill   acetaminophen (TYLENOL) 500 MG tablet Take 500 mg by mouth every 6 (six) hours as needed (pain/headaches).     albuterol (PROVENTIL) (2.5 MG/3ML) 0.083% nebulizer solution Take 3 mLs (2.5 mg total) by nebulization every 6  (six) hours as needed for wheezing or shortness of breath. 75 mL 12   apixaban (ELIQUIS) 5 MG TABS tablet TAKE 1 TABLET BY MOUTH TWICE  DAILY 180 tablet 2   atorvastatin (LIPITOR) 80 MG tablet TAKE 1 TABLET BY MOUTH  DAILY 90 tablet 3   carvedilol (COREG) 25 MG tablet TAKE 2 TABLETS BY MOUTH  TWICE DAILY 360 tablet 3   cholecalciferol (VITAMIN D) 25 MCG (1000 UNIT) tablet Take 1,000 Units by mouth in the morning, at noon, in the evening, and at bedtime.     Coenzyme Q10  300 MG CAPS Take 300 mg by mouth in the morning.     Cyanocobalamin (VITAMIN B 12) 500 MCG TABS Take 500 mcg by mouth daily.     dofetilide (TIKOSYN) 500 MCG capsule Take 1 capsule (500 mcg total) by mouth 2 (two) times daily. 180 capsule 3   fenofibrate 160 MG tablet TAKE 1 TABLET BY MOUTH  DAILY 90 tablet 3   fexofenadine (ALLEGRA) 180 MG tablet Take 90 mg by mouth 2 (two) times daily.     Fluocinonide Emulsified Base 0.05 % CREA APPLY TO AFFECTED AREA(S)  TWO TIMES DAILY AS NEEDED 60 g 2   folic acid (FOLVITE) 017 MCG tablet Take 400 mcg by mouth in the morning.     Lysine 500 MG TABS Take 500 mg by mouth 3 (three) times daily.     Magnesium 250 MG TABS Take 125 mg by mouth 2 (two) times daily.     triamcinolone (NASACORT) 55 MCG/ACT AERO nasal inhaler Place 1 spray into the nose in the morning.     No current facility-administered medications on file prior to visit.        ROS:  All others reviewed and negative.  Objective        PE:  BP 118/62 (BP Location: Right Arm, Patient Position: Sitting, Cuff Size: Large)   Pulse 74   Temp 98.3 F (36.8 C) (Oral)   Ht $R'6\' 3"'nH$  (1.905 m)   Wt 209 lb (94.8 kg)   SpO2 96%   BMI 26.12 kg/m                 Constitutional: Pt appears in NAD               HENT: Head: NCAT.                Right Ear: External ear normal.                 Left Ear: External ear normal.                Eyes: . Pupils are equal, round, and reactive to light. Conjunctivae and EOM are normal                Nose: without d/c or deformity               Neck: Neck supple. Gross normal ROM               Cardiovascular: Normal rate and regular rhythm.                 Pulmonary/Chest: Effort normal and breath sounds without rales or wheezing.                Abd:  Soft, NT, ND, + BS, no organomegaly               Neurological: Pt is alert. At baseline orientation, motor grossly intact               Skin: Skin is warm. No rashes, no other new lesions, LE edema - ***               Psychiatric: Pt behavior is normal without agitation   Micro: none  Cardiac tracings I have personally interpreted today:  none  Pertinent Radiological findings (summarize): none   Lab Results  Component Value Date   WBC 5.4 12/15/2021   HGB 14.1 12/15/2021   HCT 41.6 12/15/2021   PLT  129 (L) 12/15/2021   GLUCOSE 80 12/15/2021   CHOL 104 05/10/2021   TRIG 52.0 05/10/2021   HDL 38.10 (L) 05/10/2021   LDLCALC 56 05/10/2021   ALT 20 05/10/2021   AST 25 05/10/2021   NA 145 (H) 12/15/2021   K 4.6 12/15/2021   CL 108 (H) 12/15/2021   CREATININE 1.37 (H) 12/15/2021   BUN 21 12/15/2021   CO2 20 12/15/2021   TSH 1.89 05/10/2021   PSA 0.37 05/10/2021   INR 1.3 (H) 12/21/2020   HGBA1C 5.5 05/10/2021   Assessment/Plan:  [name] is a 81 y.o. White or Caucasian [1] male with  has a past medical history of Allergic rhinitis, Allergic rhinitis, cause unspecified (06/22/2012), Allergy, Arthritis, Atrial fibrillation (HCC), Bilateral popliteal artery aneurysm ([place]), Chronic kidney disease (CKD), stage III (moderate) ([place]), Clotting disorder ([place]), Coronary artery disease, Depression, Diverticulosis, DVT (deep venous thrombosis) ([place]), Dysphagia, History of blood clots (2003), History of colon polyps, Hyperlipidemia, Insomnia, Joint pain, Joint swelling, Neuromuscular disorder ([place]), Neuropathy, OSA (obstructive sleep apnea), Pacemaker, Peripheral vascular disease ([place]), Pleurisy, Prostatitis, Sleep apnea, and Urinary  urgency.  No problem-specific Assessment & Plan notes found for this encounter.  Followup: No follow-ups on file.  Cathlean Cower, MD 05/16/2022 1:17 PM [facility]

## 2022-05-17 ENCOUNTER — Encounter: Payer: Self-pay | Admitting: Internal Medicine

## 2022-05-17 NOTE — Assessment & Plan Note (Signed)
Lab Results  Component Value Date   HGBA1C 5.6 05/16/2022   Stable, pt to continue current medical treatment  - diet, wt control, excercise

## 2022-05-17 NOTE — Assessment & Plan Note (Signed)
Lab Results  Component Value Date   CREATININE 1.11 05/16/2022   Stable overall, cont to avoid nephrotoxins

## 2022-05-17 NOTE — Assessment & Plan Note (Signed)
Lab Results  Component Value Date   LDLCALC 48 05/16/2022   Stable, pt to continue current statin lipitor 80 mg

## 2022-05-17 NOTE — Assessment & Plan Note (Signed)
Age and sex appropriate education and counseling updated with regular exercise and diet Referrals for preventative services - none needed Immunizations addressed - declines covid booster, flu shot Smoking counseling  - none needed Evidence for depression or other mood disorder - none significant Most recent labs reviewed. I have personally reviewed and have noted: 1) the patient's medical and social history 2) The patient's current medications and supplements 3) The patient's height, weight, and BMI have been recorded in the chart  

## 2022-07-22 ENCOUNTER — Other Ambulatory Visit: Payer: Self-pay | Admitting: Internal Medicine

## 2022-08-04 ENCOUNTER — Encounter: Payer: Self-pay | Admitting: Internal Medicine

## 2022-08-07 ENCOUNTER — Encounter: Payer: Self-pay | Admitting: Internal Medicine

## 2022-08-10 NOTE — Telephone Encounter (Signed)
Ok for the RSV shot but try to realize that the risk of dying in this country from RSV pneumonia is 6000 out of about 350 million.  And probably closer to zero if you do not have significant exposure to any young people such as under 82yo.

## 2022-08-10 NOTE — Telephone Encounter (Signed)
Patient would like to know if RSV is recommended, please advise.

## 2022-08-28 DIAGNOSIS — L821 Other seborrheic keratosis: Secondary | ICD-10-CM | POA: Diagnosis not present

## 2022-08-28 DIAGNOSIS — D692 Other nonthrombocytopenic purpura: Secondary | ICD-10-CM | POA: Diagnosis not present

## 2022-08-28 DIAGNOSIS — D225 Melanocytic nevi of trunk: Secondary | ICD-10-CM | POA: Diagnosis not present

## 2022-09-07 ENCOUNTER — Ambulatory Visit: Payer: Medicare Other | Attending: Internal Medicine | Admitting: Internal Medicine

## 2022-09-07 VITALS — Ht 75.0 in

## 2022-09-07 DIAGNOSIS — I495 Sick sinus syndrome: Secondary | ICD-10-CM

## 2022-09-07 DIAGNOSIS — I480 Paroxysmal atrial fibrillation: Secondary | ICD-10-CM

## 2022-09-07 DIAGNOSIS — I5022 Chronic systolic (congestive) heart failure: Secondary | ICD-10-CM | POA: Diagnosis not present

## 2022-09-07 DIAGNOSIS — Z950 Presence of cardiac pacemaker: Secondary | ICD-10-CM | POA: Diagnosis not present

## 2022-09-07 NOTE — Progress Notes (Signed)
Electrophysiology TeleHealth Note   Due to national recommendations of social distancing due to COVID 19, an audio/video telehealth visit is felt to be most appropriate for this patient at this time.  See MyChart message from today for the patient's consent to telehealth for [facility].   Date:  09/07/2022   ID:  [name], DOB 01-15-1941, MRN [id number]  Location: patient's home  Provider location: [address] [place]  Evaluation Performed: Initial Evaluation  PCP:  Biagio Borg, MD  Cardiologist:  Evalina Field, MD  Electrophysiologist:  Virl Axe, MD   Chief Complaint:  atrial fib  History of Present Illness:    [name] is a 81 y.o. male who presents via audio/video conferencing for a telehealth visit today for  Complete heart block with atrial fibrillation       He underwent catheter ablation for his atrial fibrillation under the care of Dr. Greggory Brandy 10/22 and again 5/23; his last note suggest coming off of the dofetilide.  It is patient's pression that the dofetilide never helped and wants to come off of it.  Also desires to come off of carvedilol because of his effect on erectile function and would prefer nebivolol  Reasonably well--more energy; less symptomatic atrial fibrillation    Last Remote 8/23   DATE TEST EF    8/16 LHC   LADp-stented-patent; LADm-60%;D1-90% jailed LCXp-45%;RCAp-40%^  8/16 Echo  30-35%    3/17 Echo 35-40%    6 /17 +Echo   40-45 %    10/17 Echo  40-45  %    4/18 Echo   45%    9/19 Echo  50-55%    10/20 Echo  50-55%    2/22 Myo 20% No ischemia  3/22 Echo 40-45%    8/23 Echo  50-55%     Date Cr K Hgb  4/18 1.08 4.7 13.8  9/19 1.15 4.7 12.7  10/20 1.20 4.8 14.3  5/22 1.03 4.4 13.4  9/23 1.11 4.9 12.8       The patient denies symptoms of fevers, chills, cough, or new SOB worrisome for COVID 19.    Past Medical History:  Diagnosis Date   Allergic rhinitis    takes Allegra daily   Allergic  rhinitis, cause unspecified 06/22/2012   Allergy    Arthritis    Atrial fibrillation ([place])    takes Tikosyn and Eliquis daily   Bilateral popliteal artery aneurysm (HCC)    Chronic kidney disease (CKD), stage III (moderate) (HCC)    Clotting disorder ([place])    Coronary artery disease    LAD stenting 2004 non-DES   Depression    took Zoloft 28yr ago but nothing now   Diverticulosis    DVT (deep venous thrombosis) (HCC)    left popliteal artery greater than 10 years ago   Dysphagia    occasionally   History of blood clots 2003   left leg prior to fem pop   History of colon polyps    Hyperlipidemia    takes Tricor and Lipitor daily   Insomnia    takes Ambien nightly as needed   Joint pain    Joint swelling    Neuromuscular disorder (HCC)    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   OSA (obstructive sleep apnea)    CPAP   Pacemaker    [place]    Pleurisy    early 80's  Prostatitis    Sleep apnea    Urinary urgency     Past Surgical History:  Procedure Laterality Date   ATRIAL FIBRILLATION ABLATION N/A 06/30/2021   Procedure: ATRIAL FIBRILLATION ABLATION;  Surgeon: Thompson Grayer, MD;  Location: [place] [facility];  Service: Cardiovascular;  Laterality: N/A;   ATRIAL FIBRILLATION ABLATION N/A 01/09/2022   Procedure: ATRIAL FIBRILLATION ABLATION;  Surgeon: Thompson Grayer, MD;  Location: [place] [facility];  Service: Cardiovascular;  Laterality: N/A;   CARDIAC CATHETERIZATION  09/11/2002   with 2 stents   CARDIAC CATHETERIZATION N/A 05/06/2015   Procedure: Left Heart Cath and Coronary Angiography;  Surgeon: Belva Crome, MD;  Location: [place] [facility];  Service: Cardiovascular;  Laterality: N/A;   CARDIOVERSION N/A 12/18/2020   Procedure: CARDIOVERSION;  Surgeon: Josue Hector, MD;  Location: [place];  Service: Cardiovascular;  Laterality: N/A;   COLONOSCOPY     hand and arm surgery Right    as a teenager   HAND SURGERY Left     INGUINAL HERNIA REPAIR Right    IR RADIOLOGIST EVAL & MGMT  01/05/2021   JOINT REPLACEMENT Left 12/09/2013   Left Hip    JOINT REPLACEMENT Right 05/05/2014   Right Hip   left knee surgery     PACEMAKER INSERTION  09/25/1996   due to bradycardia   PERMANENT PACEMAKER GENERATOR CHANGE N/A 06/17/2012   Procedure: PERMANENT PACEMAKER GENERATOR CHANGE;  Surgeon: Deboraha Sprang, MD;  Location: [facility] CATH LAB;  Service: Cardiovascular;  Laterality: N/A;   right and left fem-pop bypass     SHOULDER ARTHROSCOPY WITH ROTATOR CUFF REPAIR Right 05/31/2021   Procedure: RIGHT SHOULDER ARTHROSCOPY ACROMIOPLASTY AND DEBRIDEMENT;  Surgeon: Melrose Nakayama, MD;  Location: [facility];  Service: Orthopedics;  Laterality: Right;   TOTAL HIP ARTHROPLASTY Left 12/09/2013   Procedure: TOTAL HIP ARTHROPLASTY ANTERIOR APPROACH;  Surgeon: Hessie Dibble, MD;  Location: [place];  Service: Orthopedics;  Laterality: Left;  left anterior total hip arthroplasty   TOTAL HIP ARTHROPLASTY Right 05/05/2014   Procedure: TOTAL HIP ARTHROPLASTY ANTERIOR APPROACH;  Surgeon: Hessie Dibble, MD;  Location: [place];  Service: Orthopedics;  Laterality: Right;   TURBINATE REDUCTION     wisdom extracted       Current Outpatient Medications  Medication Sig Dispense Refill   acetaminophen (TYLENOL) 500 MG tablet Take 500 mg by mouth every 6 (six) hours as needed (pain/headaches).     albuterol (PROVENTIL) (2.5 MG/3ML) 0.083% nebulizer solution Take 3 mLs (2.5 mg total) by nebulization every 6 (six) hours as needed for wheezing or shortness of breath. 75 mL 12   apixaban (ELIQUIS) 5 MG TABS tablet TAKE 1 TABLET BY MOUTH TWICE  DAILY 180 tablet 2   atorvastatin (LIPITOR) 80 MG tablet TAKE 1 TABLET BY MOUTH  DAILY 90 tablet 3   carvedilol (COREG) 25 MG tablet TAKE 2 TABLETS BY MOUTH TWICE  DAILY 360 tablet 2   Coenzyme Q10 300 MG CAPS Take 300 mg by mouth in the morning.     Cyanocobalamin (VITAMIN B 12) 500 MCG TABS Take 500 mcg by mouth  daily. 1/4 tab weekly     dofetilide (TIKOSYN) 500 MCG capsule Take 1 capsule (500 mcg total) by mouth 2 (two) times daily. 180 capsule 3   fenofibrate 160 MG tablet TAKE 1 TABLET BY MOUTH  DAILY 90 tablet 3   fexofenadine (ALLEGRA) 180 MG tablet Take 90 mg by mouth 2 (two) times daily.  Fluocinonide Emulsified Base 0.05 % CREA APPLY TO AFFECTED AREA(S)  TWO TIMES DAILY AS NEEDED 60 g 2   folic acid (FOLVITE) 388 MCG tablet Take 400 mcg by mouth in the morning.     Lysine 500 MG TABS Take 500 mg by mouth 3 (three) times daily.     Magnesium 250 MG TABS Take 125 mg by mouth 2 (two) times daily. With vitamin d     triamcinolone (NASACORT) 55 MCG/ACT AERO nasal inhaler Place 2 sprays into the nose in the morning.     No current facility-administered medications for this visit.    Allergies:   Amiodarone, Niacin, Ace inhibitors, and Mometasone furoate      ROS:  Please see the history of present illness.   All other systems are personally reviewed and negative.    Exam:    Vital Signs:  Ht '6\' 3"'$  (1.905 m)   BMI 26.12 kg/m        Labs/Other Tests and Data Reviewed:    Recent Labs: 05/16/2022: ALT 21; BUN 18; Creatinine, Ser 1.11; Hemoglobin 12.8; Platelets 136.0; Potassium 4.9; Sodium 145; TSH 1.76   Wt Readings from Last 3 Encounters:  05/16/22 209 lb (94.8 kg)  04/28/22 207 lb (93.9 kg)  04/04/22 205 lb 9.6 oz (93.3 kg)     Other studies personally reviewed: Additional studies/ records that were reviewed today include: (As above)     The patient pacemaker device technology report from August was reviewed on my review, the tracings reveal significant interval atrial fibrillation      ASSESSMENT & PLAN:   Atrial fibrillation-paroxysmal   Symptomatic ventricular pacing   Pacemaker Biotronik         LBBB    Nonischemic cardiomyopathy recurrent   Coronary artery disease A) prior stenting B) diffuse nonobstructive disease   Congestive heart failure acute/chronic  class IIb   Dofetilide-  he would like to stop it. So will do and tehn review Afib burden at next visit.  Aware that he might need rehospitalization if he needs to have it reintroduced  He wiould like to stop his carvedilol also for ED and change to nebivolol we have decided to do 1 thing at a time so we will continue his carvedilol for now  Rare palps        COVID 19 screen The patient denies symptoms of COVID 19 at this time.  The importance of social distancing was discussed today.  Follow-up:  48mNext remote: n/a   Current medicines are reviewed at length with the patient today.   The patient has  concerns regarding his medicines.  The following changes were made today:  (As above)   Labs/ tests ordered today include: Bmet  No orders of the defined types were placed in this encounter.      Patient Risk:  after full review of this patients clinical status, I feel that they are at moderate risk at this time.  Today, I have spent 14 minutes with the patient with telehealth technology discussing (As above)  .    Signed, SVirl Axe MD  09/07/2022 5:04 PM     [place]ake [place] the [place] [address]([phone]) ([phone](fax)

## 2022-09-07 NOTE — Patient Instructions (Signed)
Medication Instructions:  Your physician recommends that you continue on your current medications as directed. Please refer to the Current Medication list given to you today.  *If you need a refill on your cardiac medications before your next appointment, please call your pharmacy*  Lab Work: None ordered.  If you have labs (blood work) drawn today and your tests are completely normal, you will receive your results only by: [place] (if you have MyChart) OR A paper copy in the mail If you have any lab test that is abnormal or we need to change your treatment, we will call you to review the results.  Testing/Procedures: None ordered.  Follow-Up: At [facility], you and your health needs are our priority.  As part of our continuing mission to provide you with exceptional heart care, we have created designated Provider Care Teams.  These Care Teams include your primary Cardiologist (physician) and Advanced Practice Providers (APPs -  Physician Assistants and Nurse Practitioners) who all work together to provide you with the care you need, when you need it.  We recommend signing up for the patient portal called "MyChart".  Sign up information is provided on this After Visit Summary.  MyChart is used to connect with patients for Virtual Visits (Telemedicine).  Patients are able to view lab/test results, encounter notes, upcoming appointments, etc.  Non-urgent messages can be sent to your provider as well.   To learn more about what you can do with MyChart, go to [email or web address].    Your next appointment:   1 year(s)  The format for your next appointment:   In Person  Provider:   Dr. Virl Axe    Important Information About Sugar

## 2022-09-18 ENCOUNTER — Other Ambulatory Visit: Payer: Self-pay | Admitting: Internal Medicine

## 2022-09-18 DIAGNOSIS — I480 Paroxysmal atrial fibrillation: Secondary | ICD-10-CM

## 2022-09-18 NOTE — Telephone Encounter (Signed)
Prescription refill request for Eliquis received. Indication:afib Last office visit:12/23 Scr:1.1 Age: 82 Weight:94.8  kg  Prescription refilled

## 2022-10-05 ENCOUNTER — Ambulatory Visit (HOSPITAL_COMMUNITY)
Admission: RE | Admit: 2022-10-05 | Discharge: 2022-10-05 | Disposition: A | Discharge status: Home / Self Care | Payer: Medicare Other | Source: Ambulatory Visit | Attending: Pulmonary Disease | Admitting: Pulmonary Disease

## 2022-10-05 DIAGNOSIS — J849 Interstitial pulmonary disease, unspecified: Secondary | ICD-10-CM | POA: Diagnosis not present

## 2022-10-05 DIAGNOSIS — I2510 Atherosclerotic heart disease of native coronary artery without angina pectoris: Secondary | ICD-10-CM | POA: Diagnosis not present

## 2022-10-05 DIAGNOSIS — R918 Other nonspecific abnormal finding of lung field: Secondary | ICD-10-CM | POA: Diagnosis not present

## 2022-10-05 DIAGNOSIS — J8410 Pulmonary fibrosis, unspecified: Secondary | ICD-10-CM | POA: Diagnosis not present

## 2022-10-05 DIAGNOSIS — J479 Bronchiectasis, uncomplicated: Secondary | ICD-10-CM | POA: Diagnosis not present

## 2022-10-11 ENCOUNTER — Encounter: Payer: Self-pay | Admitting: Pulmonary Disease

## 2022-10-11 ENCOUNTER — Ambulatory Visit: Payer: Medicare Other | Admitting: Pulmonary Disease

## 2022-10-11 VITALS — BP 118/64 | HR 69 | Ht 75.0 in | Wt 211.0 lb

## 2022-10-11 DIAGNOSIS — J84112 Idiopathic pulmonary fibrosis: Secondary | ICD-10-CM | POA: Diagnosis not present

## 2022-10-11 DIAGNOSIS — J849 Interstitial pulmonary disease, unspecified: Secondary | ICD-10-CM

## 2022-10-11 NOTE — Progress Notes (Unsigned)
Synopsis: Referred in December 2022 for ILD by Thompson Grayer, MD  Subjective:   PATIENT ID: [name] GENDER: male DOB: 08-14-1941, MRN: [id number]  HPI  Chief Complaint  Patient presents with   Follow-up    6 mo f/u to discuss CT scan results from 01/25. States his breathing has been stable since last visit.    [name] is an 82 year old male, never smoker with atrial fibrillation, CKD III, CAD, OSA, DVT and PVD who returns to pulmonary clinic for pulmonary fibrosis.   His wife as accompanied him on this visit.   He overall feels well since last visit. No issues with dyspnea. He feels he is coughing a bit more with the weather changes and is using albuterol nebulizer treatments which helps him clear his airways.   HRCT Chest 10/05/22 is stable compared to 08/11/21.   OV 04/04/22 He had atrial fibrillation ablation on 01/09/22 and has been feeling better since the procedure. He remains in sinus rhythm.   Inflammatory workup up from 12/29/21 is unremarkable.   We discussed treatment with anti-fibrotic medications, their efficacy and their side effect profiles.  OV 12/28/21 PFTs today show moderate diffusion defect. We discussed the correlation between the high resolution CT Chest scan and the PFT results. He has exertional dyspnea only when really pushing himself. Otherwise no daily limitations in his activities due to dyspnea. He is having a repeat atrial fibrillation ablation on May 1.   OV 09/07/21 Patient had CT cardiac scan on 06/23/2021 fibrotic changes at the bilateral lung bases and it was recommended he have a high-resolution CT chest scan for further evaluation.  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  He was also noted to have mild central cylindrical bronchiectasis and a 4 mm subpleural posterior left lower lobe nodule.  He denies any shortness of breath or limitations to his daily  activities.  He denies any wheezing.  He does report a congested cough over the past 34 years.  The cough does not wake him up at night.  He feels like the cough is coming from deep in his chest and will produce whitish phlegm occasionally.  He reports he developed a cough since an episode of pleurisy in 1988.  He is followed by ENT for perforated septum due to cauterization procedures for epistaxis in the past.  He is using Nasacort nasal spray daily.  He does have postnasal drainage.  He also reports dry nasal/sinus passages.  He has a history of obstructive sleep apnea and using his CPAP machine that is nearly 83 years old.  He does not use humidification.  He does not follow with a sleep medicine physician.  He does report occasional GERD symptoms.  He has reduced his caffeine intake to reduce the symptoms.  He is a never smoker but does report significant secondhand smoke exposure in childhood.  He is on her [place] family homes, a single-family home [place].  He denies any significant dust exposures over the years as he has mainly been on the distal side.  He denies any skin rashes or joint aches.  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[place])    Chronic kidney disease (CKD), stage III (moderate) (HCC)    Clotting disorder ([place])    Coronary artery disease    LAD stenting 2004 non-DES   Depression    took Zoloft 61yr ago but nothing now   Diverticulosis    DVT (deep venous thrombosis) (HCC)    left popliteal artery greater than 10 years ago   Dysphagia    occasionally   History of blood clots 2003   left leg prior to fem pop   History of colon polyps    Hyperlipidemia    takes Tricor and Lipitor daily   Insomnia    takes Ambien nightly as needed    Joint pain    Joint swelling    Neuromuscular disorder (HCC)    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   OSA (obstructive sleep apnea)    CPAP   Pacemaker    Peripheral vascular disease (HCC)    Pleurisy    early 80's   Prostatitis    Sleep apnea    Urinary urgency      Family History  Problem Relation Age of Onset   Diabetes Mother    Heart disease Mother        Before age 551  Heart disease Father        Before age 83  Heart attack Father    Stroke Father    Aneurysm Father        bilat pop art aneurysms   Peripheral vascular disease Father        Popliteal Aneurysm   Diabetes Other        family hx   Sleep apnea Other        family hx   Allergic rhinitis Neg Hx    Angioedema Neg Hx    Asthma Neg Hx    Eczema Neg Hx    Immunodeficiency Neg Hx    Urticaria Neg Hx    Colon cancer Neg Hx    Esophageal cancer Neg Hx    Prostate cancer Neg Hx    Rectal cancer Neg Hx    Stomach cancer Neg Hx      Social History   Socioeconomic History   Marital status: Married    Spouse name: Not on file   Number of children: Not on file   Years of education: Not on file   Highest education level: Not on file  Occupational History   Not on file  Tobacco Use   Smoking status: Never    Passive exposure: Past   Smokeless tobacco: Never   Tobacco comments:    Never smoke 02/07/22  Vaping Use   Vaping Use: Never used  Substance and Sexual Activity   Alcohol use: No    Alcohol/week: 0.0 standard drinks of alcohol    Comment: nothing since 2000   Drug use: No   Sexual activity: Yes  Other Topics Concern   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    Social Determinants of Health   Financial Resource Strain: Low Risk  (10/31/2021)   Overall Financial Resource Strain (CARDIA)    Difficulty of Paying Living Expenses: Not hard at all  Food Insecurity: No Food Insecurity (10/31/2021)   Hunger Vital Sign     Worried About Running Out of Food in the Last Year: Never true    Ran Out of Food in  the Last Year: Never true  Transportation Needs: No Transportation Needs (10/31/2021)   PRAPARE - Hydrologist (Medical): No    Lack of Transportation (Non-Medical): No  Physical Activity: Insufficiently Active (10/31/2021)   Exercise Vital Sign    Days of Exercise per Week: 3 days    Minutes of Exercise per Session: 40 min  Stress: No Stress Concern Present (10/31/2021)   [name]    Feeling of Stress : Not at all  Social Connections: Moderately Integrated (10/31/2021)   Social Connection and Isolation Panel [NHANES]    Frequency of Communication with Friends and Family: Three times a week    Frequency of Social Gatherings with Friends and Family: Three times a week    Attends Religious Services: Never    Active Member of Clubs or Organizations: Yes    Attends Archivist Meetings: More than 4 times per year    Marital Status: Married  Human resources officer Violence: Not At Risk (10/31/2021)   Humiliation, Afraid, Rape, and Kick questionnaire    Fear of Current or Ex-Partner: No    Emotionally Abused: No    Physically Abused: No    Sexually Abused: No     Allergies  Allergen Reactions   Amiodarone Other (See Comments)    Peripheral neuropathy resulted   Niacin Other (See Comments)    Caused an irregular heartbeat   Ace Inhibitors Cough   Mometasone Furoate Other (See Comments)    (Nasonex) Causes nasal bleeding and nose bleeds     Outpatient Medications Prior to Visit  Medication Sig Dispense Refill   acetaminophen (TYLENOL) 500 MG tablet Take 500 mg by mouth every 6 (six) hours as needed (pain/headaches).     albuterol (PROVENTIL) (2.5 MG/3ML) 0.083% nebulizer solution Take 3 mLs (2.5 mg total) by nebulization every 6 (six) hours as needed for wheezing or shortness of breath. 75 mL 12    atorvastatin (LIPITOR) 80 MG tablet TAKE 1 TABLET BY MOUTH  DAILY 90 tablet 3   carvedilol (COREG) 25 MG tablet TAKE 2 TABLETS BY MOUTH TWICE  DAILY 360 tablet 2   Coenzyme Q10 300 MG CAPS Take 300 mg by mouth in the morning.     Cyanocobalamin (VITAMIN B 12) 500 MCG TABS Take 500 mcg by mouth daily. 1/4 tab weekly     ELIQUIS 5 MG TABS tablet TAKE 1 TABLET BY MOUTH TWICE  DAILY 200 tablet 2   fenofibrate 160 MG tablet TAKE 1 TABLET BY MOUTH DAILY 100 tablet 2   fexofenadine (ALLEGRA) 180 MG tablet Take 90 mg by mouth 2 (two) times daily.     Fluocinonide Emulsified Base 0.05 % CREA APPLY TO AFFECTED AREA(S)  TWO TIMES DAILY AS NEEDED 60 g 2   folic acid (FOLVITE) 025 MCG tablet Take 400 mcg by mouth in the morning.     Lysine 500 MG TABS Take 500 mg by mouth 3 (three) times daily.     Magnesium 250 MG TABS Take 125 mg by mouth 2 (two) times daily. With vitamin d     triamcinolone (NASACORT) 55 MCG/ACT AERO nasal inhaler Place 2 sprays into the nose in the morning.     dofetilide (TIKOSYN) 500 MCG capsule Take 1 capsule (500 mcg total) by mouth 2 (two) times daily. 180 capsule 3   No facility-administered medications prior to visit.   Review of Systems  Constitutional:  Negative for chills, fever, malaise/fatigue  and weight loss.  HENT:  Negative for congestion, sinus pain and sore throat.   Eyes: Negative.   Respiratory:  Positive for cough and sputum production. Negative for hemoptysis, shortness of breath and wheezing.   Cardiovascular:  Negative for chest pain, palpitations, orthopnea, claudication and leg swelling.  Gastrointestinal:  Negative for abdominal pain, heartburn, nausea and vomiting.  Genitourinary: Negative.   Musculoskeletal:  Negative for joint pain and myalgias.  Skin:  Negative for rash.  Neurological:  Negative for weakness.  Endo/Heme/Allergies: Negative.   Psychiatric/Behavioral: Negative.      Objective:   Vitals:   10/11/22 1311  BP: 118/64  Pulse: 69   SpO2: 100%  Weight: 211 lb (95.7 kg)  Height: '6\' 3"'$  (1.905 m)    Physical Exam Constitutional:      General: He is not in acute distress. HENT:     Head: Normocephalic and atraumatic.  Eyes:     Conjunctiva/sclera: Conjunctivae normal.  Cardiovascular:     Rate and Rhythm: Normal rate and regular rhythm.     Pulses: Normal pulses.     Heart sounds: Normal heart sounds. No murmur heard. Pulmonary:     Effort: Pulmonary effort is normal.     Breath sounds: Rales (faint, bibasilar) present.  Musculoskeletal:     Right lower leg: No edema.     Left lower leg: No edema.  Skin:    General: Skin is warm and dry.  Neurological:     General: No focal deficit present.     Mental Status: He is alert.  Psychiatric:        Mood and Affect: Mood normal.        Behavior: Behavior normal.        Thought Content: Thought content normal.        Judgment: Judgment normal.    CBC    Component Value Date/Time   WBC 4.8 05/16/2022 1350   RBC 3.80 (L) 05/16/2022 1350   HGB 12.8 (L) 05/16/2022 1350   HGB 14.1 12/15/2021 1533   HCT 37.9 (L) 05/16/2022 1350   HCT 41.6 12/15/2021 1533   PLT 136.0 (L) 05/16/2022 1350   PLT 129 (L) 12/15/2021 1533   MCV 99.8 05/16/2022 1350   MCV 97 12/15/2021 1533   MCH 32.8 12/15/2021 1533   MCH 33.5 01/16/2021 1745   MCHC 33.7 05/16/2022 1350   RDW 14.6 05/16/2022 1350   RDW 12.4 12/15/2021 1533   LYMPHSABS 1.1 05/16/2022 1350   LYMPHSABS 1.2 12/15/2021 1533   MONOABS 0.5 05/16/2022 1350   EOSABS 0.2 05/16/2022 1350   EOSABS 0.2 12/15/2021 1533   BASOSABS 0.0 05/16/2022 1350   BASOSABS 0.0 12/15/2021 1533      Latest Ref Rng & Units 05/16/2022    1:50 PM 12/15/2021    3:33 PM 06/06/2021   10:42 AM  BMP  Glucose 70 - 99 mg/dL 121  80  86   BUN 6 - 23 mg/dL '18  21  17   '$ Creatinine 0.40 - 1.50 mg/dL 1.11  1.37  1.18   BUN/Creat Ratio 10 - '24  15  14   '$ Sodium 135 - 145 mEq/L 145  145  141   Potassium 3.5 - 5.1 mEq/L 4.9  4.6  4.7   Chloride 96  - 112 mEq/L 109  108  105   CO2 19 - 32 mEq/L '30  20  24   '$ Calcium 8.4 - 10.5 mg/dL 9.8  9.8  9.7  Chest imaging: HRCT Chest 10/05/22 1. No significant change in mild bibasilar pulmonary fibrosis featuring somewhat heterogeneously distributed irregular peripheral interstitial opacity, septal thickening, and subpleural bronchiolectasis at the lung bases. 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 [place], Iss 5, [phone], May 12 2017. 2. Coronary artery disease. 3. Aortic valve calcifications. Correlate for echocardiographic evidence of aortic valve dysfunction.  HRCT Chest 08/11/21 1. Pulmonary parenchymal pattern of fibrosis may be due to usual interstitial pneumonitis or nonspecific interstitial pneumonitis. Findings are categorized as probable UIP per consensus guidelines: Diagnosis of Idiopathic Pulmonary Fibrosis: An Official ATS/ERS/JRS/ALAT Clinical Practice Guideline. [place], Iss 5, [phone], May 12 2017. 2. Small pericardial effusion. 3. Mild central cylindrical bronchiectasis. 4. 4 mm subpleural posterior left lower lobe nodule. No follow-up needed if patient is low-risk. Non-contrast chest CT can be considered in 12 months if patient is high-risk. This recommendation follows the consensus statement: Guidelines for Management of Incidental Pulmonary Nodules Detected on CT Images: From the Fleischner Society 2017; Radiology 2017; 284:228-243. 5. Cirrhosis. 6. Aortic atherosclerosis (ICD10-I70.0). Coronary artery calcification.  PFT:    Latest Ref Rng & Units 12/28/2021    3:00 PM  PFT Results  FVC-Pre L 4.44   FVC-Predicted Pre % 92   FVC-Post L 4.40   FVC-Predicted Post % 91   Pre FEV1/FVC % % 75   Post FEV1/FCV % % 74   FEV1-Pre L 3.32   FEV1-Predicted Pre % 96   FEV1-Post L  3.24   DLCO uncorrected ml/min/mmHg 15.84   DLCO UNC% % 56   DLCO corrected ml/min/mmHg 16.07   DLCO COR %Predicted % 57   DLVA Predicted % 73   TLC L 7.05   TLC % Predicted % 87   RV % Predicted % 92   12/28/21: Mild diffusion defect.  Labs:  Path:  Echo 11/2020: LV EF 40-45%. Abnormal septal motion, consistent with left bundle branch block. Grade II diastolic dysfunction. RV systolic pressure is normal. RV size is moderately enlarged. LA severely dilated. RA mildly dilated.   Heart Catheterization:  Assessment & Plan:   ILD (interstitial lung disease) ([place]) - Plan: Pulmonary Function Test, 6 minute walk  Idiopathic pulmonary fibrosis (HCC)  Discussion: [name] is an 82 year old male, never smoker with atrial fibrillation, CKD III, CAD, OSA, DVT and PVD who returns to pulmonary clinic for pulmonary fibrosis.   He has stable disease based on monitoring with HRCT chests over the past year. We will repeat PFTs this April for comparision and likely plan annual follow up with CT Chest/PFTs.   He remains very functional and has good quality of life.   We discussed the significant side effect profiles of anti-fibrotic medications. Given his lack of clinical symptoms, we will continue to monitor patient closely.   Follow up in 3 months with PFTs.  Freda Jackson, MD [facility] Office: [phone]   Current Outpatient Medications:    acetaminophen (TYLENOL) 500 MG tablet, Take 500 mg by mouth every 6 (six) hours as needed (pain/headaches)., Disp: , Rfl:    albuterol (PROVENTIL) (2.5 MG/3ML) 0.083% nebulizer solution, Take 3 mLs (2.5 mg total) by nebulization every 6 (six) hours as needed for wheezing or shortness of breath., Disp: 75 mL, Rfl: 12   atorvastatin (LIPITOR) 80 MG tablet, TAKE 1 TABLET BY MOUTH  DAILY, Disp:  90 tablet, Rfl: 3   carvedilol (COREG) 25 MG tablet, TAKE 2 TABLETS BY MOUTH TWICE  DAILY, Disp: 360 tablet, Rfl: 2   Coenzyme Q10  300 MG CAPS, Take 300 mg by mouth in the morning., Disp: , Rfl:    Cyanocobalamin (VITAMIN B 12) 500 MCG TABS, Take 500 mcg by mouth daily. 1/4 tab weekly, Disp: , Rfl:    ELIQUIS 5 MG TABS tablet, TAKE 1 TABLET BY MOUTH TWICE  DAILY, Disp: 200 tablet, Rfl: 2   fenofibrate 160 MG tablet, TAKE 1 TABLET BY MOUTH DAILY, Disp: 100 tablet, Rfl: 2   fexofenadine (ALLEGRA) 180 MG tablet, Take 90 mg by mouth 2 (two) times daily., Disp: , Rfl:    Fluocinonide Emulsified Base 0.05 % CREA, APPLY TO AFFECTED AREA(S)  TWO TIMES DAILY AS NEEDED, Disp: 60 g, Rfl: 2   folic acid (FOLVITE) 008 MCG tablet, Take 400 mcg by mouth in the morning., Disp: , Rfl:    Lysine 500 MG TABS, Take 500 mg by mouth 3 (three) times daily., Disp: , Rfl:    Magnesium 250 MG TABS, Take 125 mg by mouth 2 (two) times daily. With vitamin d, Disp: , Rfl:    triamcinolone (NASACORT) 55 MCG/ACT AERO nasal inhaler, Place 2 sprays into the nose in the morning., Disp: , Rfl:

## 2022-10-11 NOTE — Patient Instructions (Addendum)
Your CT chest scan is stable compared to 2022 with mild pulmonary fibrosis  We will schedule you for a 6 minute walk test  We will check PFTs at follow up in 3 months

## 2022-10-12 ENCOUNTER — Encounter: Payer: Self-pay | Admitting: Pulmonary Disease

## 2022-10-26 ENCOUNTER — Telehealth: Payer: Self-pay | Admitting: Internal Medicine

## 2022-10-26 DIAGNOSIS — I428 Other cardiomyopathies: Secondary | ICD-10-CM

## 2022-10-26 DIAGNOSIS — I509 Heart failure, unspecified: Secondary | ICD-10-CM

## 2022-10-26 DIAGNOSIS — I480 Paroxysmal atrial fibrillation: Secondary | ICD-10-CM

## 2022-10-26 NOTE — Telephone Encounter (Signed)
New message   Pt wants to know if he can have an echo ordered before he schedules an appt with [facility]. Per his last conversation with MD, he was to f/u in Feb with an echo before.

## 2022-10-30 ENCOUNTER — Encounter: Payer: Medicare Other | Admitting: Internal Medicine

## 2022-10-30 NOTE — Telephone Encounter (Signed)
Per Dr Caryl Comes OK to order echo.  Order placed. Dr Olin Pia scheduler made aware.

## 2022-11-06 ENCOUNTER — Ambulatory Visit (INDEPENDENT_AMBULATORY_CARE_PROVIDER_SITE_OTHER): Payer: Medicare Other

## 2022-11-06 VITALS — Ht 75.0 in | Wt 201.9 lb

## 2022-11-06 DIAGNOSIS — Z0000 Encounter for general adult medical examination without abnormal findings: Secondary | ICD-10-CM | POA: Diagnosis not present

## 2022-11-06 NOTE — Progress Notes (Signed)
I connected with  [name] on 11/06/2022 at 11:30 a.m. EST by telephone and verified that I am speaking with the correct person using two identifiers.  Location: Patient: Home Provider: [place] Persons participating in the virtual visit: [name]   I discussed the limitations, risks, security and privacy concerns of performing an evaluation and management service by telephone and the availability of in person appointments. The patient expressed understanding and agreed to proceed.  Interactive audio and video telecommunications were attempted between this nurse and patient, however failed, due to patient having technical difficulties OR patient did not have access to video capability.  We continued and completed visit with audio only.  Some vital signs may be absent or patient reported.   Sheral Flow, LPN  Subjective:   [name] is a 82 y.o. male who presents for Medicare Annual/Subsequent preventive examination.  Review of Systems     Cardiac Risk Factors include: advanced age (>63mn, >>71women);family history of premature cardiovascular disease;dyslipidemia;male gender     Objective:    Today's Vitals   11/06/22 1131  Weight: 201 lb 14.4 oz (91.6 kg)  Height: '6\' 3"'$  (1.905 m)  PainSc: 0-No pain   Body mass index is 25.24 kg/m.     11/06/2022   11:34 AM 01/09/2022    9:25 AM 10/31/2021   11:34 AM 06/30/2021    9:19 AM 05/31/2021    3:11 PM 05/25/2021    2:28 PM 12/16/2020    6:40 AM  Advanced Directives  Does Patient Have a Medical Advance Directive? Yes Yes Yes Yes Yes Yes No  Type of AParamedicof AR[place]Living will HW[place]Living will HFriendship Heights VillageLiving will HM[place]Living will Healthcare Power of [place]entral Paco[place]Living will   Does patient want to make changes to medical advance directive?  No - Patient declined   No -  Patient declined    Copy of [place] Chart? No - copy requested  No - copy requested  No - copy requested    Would patient like information on creating a medical advance directive?     No - Patient declined  No - Patient declined    Current Medications (verified) Outpatient Encounter Medications as of 11/06/2022  Medication Sig   acetaminophen (TYLENOL) 500 MG tablet Take 500 mg by mouth every 6 (six) hours as needed (pain/headaches).   albuterol (PROVENTIL) (2.5 MG/3ML) 0.083% nebulizer solution Take 3 mLs (2.5 mg total) by nebulization every 6 (six) hours as needed for wheezing or shortness of breath.   atorvastatin (LIPITOR) 80 MG tablet TAKE 1 TABLET BY MOUTH  DAILY   carvedilol (COREG) 25 MG tablet TAKE 2 TABLETS BY MOUTH TWICE  DAILY   Coenzyme Q10 300 MG CAPS Take 300 mg by mouth in the morning.   Cyanocobalamin (VITAMIN B 12) 500 MCG TABS Take 500 mcg by mouth daily. 1/4 tab weekly   ELIQUIS 5 MG TABS tablet TAKE 1 TABLET BY MOUTH TWICE  DAILY   fenofibrate 160 MG tablet TAKE 1 TABLET BY MOUTH DAILY   fexofenadine (ALLEGRA) 180 MG tablet Take 90 mg by mouth 2 (two) times daily.   Fluocinonide Emulsified Base 0.05 % CREA APPLY TO AFFECTED AREA(S)  TWO TIMES DAILY AS NEEDED   folic acid (FOLVITE) 4A999333MCG tablet Take 400 mcg by mouth in the morning.   Lysine 500 MG TABS Take 500 mg by mouth 3 (  three) times daily.   Magnesium 250 MG TABS Take 125 mg by mouth 2 (two) times daily. With vitamin d   triamcinolone (NASACORT) 55 MCG/ACT AERO nasal inhaler Place 2 sprays into the nose in the morning.   No facility-administered encounter medications on file as of 11/06/2022.    Allergies (verified) Amiodarone, Niacin, Ace inhibitors, and Mometasone furoate   History: Past Medical History:  Diagnosis Date   Allergic rhinitis    takes Allegra daily   Allergic rhinitis, cause unspecified 06/22/2012   Allergy    Arthritis    Atrial fibrillation (HCC)    takes  Tikosyn and Eliquis daily   Bilateral popliteal artery aneurysm (HCC)    Chronic kidney disease (CKD), stage III (moderate) (HCC)    Clotting disorder (HCC)    Coronary artery disease    LAD stenting 2004 non-DES   Depression    took Zoloft 52yr ago but nothing now   Diverticulosis    DVT (deep venous thrombosis) (HCC)    left popliteal artery greater than 10 years ago   Dysphagia    occasionally   History of blood clots 2003   left leg prior to fem pop   History of colon polyps    Hyperlipidemia    takes Tricor and Lipitor daily   Insomnia    takes Ambien nightly as needed   Joint pain    Joint swelling    Neuromuscular disorder (HCC)    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   OSA (obstructive sleep apnea)    CPAP   Pacemaker    Peripheral vascular disease ([place]    Pleurisy    early 80's   Prostatitis    Sleep apnea    Urinary urgency    Past Surgical History:  Procedure Laterality Date   ATRIAL FIBRILLATION ABLATION N/A 06/30/2021   Procedure: ATRIAL FIBRILLATION ABLATION;  Surgeon: AThompson Grayer MD;  [facility];  Service: Cardiovascular;  Laterality: N/A;   ATRIAL FIBRILLATION ABLATION N/A 01/09/2022   Procedure: ATRIAL FIBRILLATION ABLATION;  Surgeon: AThompson Grayer MD;  [facility];  Service: Cardiovascular;  Laterality: N/A;   CARDIAC CATHETERIZATION  09/11/2002   with 2 stents   CARDIAC CATHETERIZATION N/A 05/06/2015   Procedure: Left Heart Cath and Coronary Angiography;  Surgeon: HBelva Crome MD;  [facility];  Service: Cardiovascular;  Laterality: N/A;   CARDIOVERSION N/A 12/18/2020   Procedure: CARDIOVERSION;  Surgeon: NJosue Hector MD;  [place]  Service: Cardiovascular;  Laterality: N/A;   COLONOSCOPY     hand and arm surgery Right    as a teenager   HAND SURGERY Left    INGUINAL HERNIA REPAIR Right    IR RADIOLOGIST EVAL & MGMT  01/05/2021   JOINT REPLACEMENT Left  12/09/2013   Left Hip    JOINT REPLACEMENT Right 05/05/2014   Right Hip   left knee surgery     PACEMAKER INSERTION  09/25/1996   due to bradycardia   PERMANENT PACEMAKER GENERATOR CHANGE N/A 06/17/2012   Procedure: PERMANENT PACEMAKER GENERATOR CHANGE;  Surgeon: SDeboraha Sprang MD;  [facility];  Service: Cardiovascular;  Laterality: N/A;   right and left fem-pop bypass     SHOULDER ARTHROSCOPY WITH ROTATOR CUFF REPAIR Right 05/31/2021   Procedure: RIGHT SHOULDER ARTHROSCOPY ACROMIOPLASTY AND DEBRIDEMENT;  Surgeon: DMelrose Nakayama MD;  Location[facility];  Service: Orthopedics;  Laterality: Right;  TOTAL HIP ARTHROPLASTY Left 12/09/2013   Procedure: TOTAL HIP ARTHROPLASTY ANTERIOR APPROACH;  Surgeon: Hessie Dibble, MD;  Location: [place];  Service: Orthopedics;  Laterality: Left;  left anterior total hip arthroplasty   TOTAL HIP ARTHROPLASTY Right 05/05/2014   Procedure: TOTAL HIP ARTHROPLASTY ANTERIOR APPROACH;  Surgeon: Hessie Dibble, MD;  Location: [place];  Service: Orthopedics;  Laterality: Right;   TURBINATE REDUCTION     wisdom extracted      Family History  Problem Relation Age of Onset   Diabetes Mother    Heart disease Mother        Before age 71   Heart disease Father        Before age 29   Heart attack Father    Stroke Father    Aneurysm Father        bilat pop art aneurysms   Peripheral vascular disease Father        Popliteal Aneurysm   Diabetes Other        family hx   Sleep apnea Other        family hx   Allergic rhinitis Neg Hx    Angioedema Neg Hx    Asthma Neg Hx    Eczema Neg Hx    Immunodeficiency Neg Hx    Urticaria Neg Hx    Colon cancer Neg Hx    Esophageal cancer Neg Hx    Prostate cancer Neg Hx    Rectal cancer Neg Hx    Stomach cancer Neg Hx    Social History   Socioeconomic History   Marital status: Married    Spouse name: Not on file   Number of children: Not on file   Years of education: Not on file   Highest education  level: Not on file  Occupational History   Not on file  Tobacco Use   Smoking status: Never    Passive exposure: Past   Smokeless tobacco: Never   Tobacco comments:    Never smoke 02/07/22  Vaping Use   Vaping Use: Never used  Substance and Sexual Activity   Alcohol use: No    Alcohol/week: 0.0 standard drinks of alcohol    Comment: nothing since 2000   Drug use: No   Sexual activity: Yes  Other Topics Concern   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    Social Determinants of Health   Financial Resource Strain: Low Risk  (10/31/2021)   Overall Financial Resource Strain (CARDIA)    Difficulty of Paying Living Expenses: Not hard at all  Food Insecurity: No Food Insecurity (10/31/2021)   Hunger Vital Sign    Worried About Running Out of Food in the Last Year: Never true    Ran Out of Food in the Last Year: Never true  Transportation Needs: No Transportation Needs (10/31/2021)   PRAPARE - Hydrologist (Medical): No    Lack of Transportation (Non-Medical): No  Physical Activity: Insufficiently Active (10/31/2021)   Exercise Vital Sign    Days of Exercise per Week: 3 days    Minutes of Exercise per Session: 40 min  Stress: No Stress Concern Present (10/31/2021)   [place]    Feeling of Stress : Not at all  Social Connections: Moderately Integrated (10/31/2021)   Social Connection and Isolation Panel [NHANES]    Frequency of Communication with Friends and  Family: Three times a week    Frequency of Social Gatherings with Friends and Family: Three times a week    Attends Religious Services: Never    Active Member of Clubs or Organizations: Yes    Attends Archivist Meetings: More than 4 times per year    Marital Status: Married    Tobacco Counseling Counseling given: Not Answered Tobacco comments: Never smoke  02/07/22   Clinical Intake:  Pre-visit preparation completed: Yes  Pain : No/denies pain Pain Score: 0-No pain     BMI - recorded: 25.24 Nutritional Status: BMI 25 -29 Overweight Nutritional Risks: None Diabetes: No  How often do you need to have someone help you when you read instructions, pamphlets, or other written materials from your doctor or pharmacy?: 1 - Never What is the last grade level you completed in school?: HSG  Diabetic? No  Interpreter Needed?: No  Information entered by :: Lisette Abu, LPN.   Activities of Daily Living    11/06/2022   11:40 AM  In your present state of health, do you have any difficulty performing the following activities:  Hearing? 0  Vision? 0  Difficulty concentrating or making decisions? 1  Comment just a little  Walking or climbing stairs? 0  Dressing or bathing? 0  Doing errands, shopping? 0  Preparing Food and eating ? N  Using the Toilet? N  In the past six months, have you accidently leaked urine? N  Do you have problems with loss of bowel control? N  Managing your Medications? N  Managing your Finances? N  Housekeeping or managing your Housekeeping? N    Patient Care Team: Biagio Borg, MD as PCP - General (Internal Medicine) O'Neal, Cassie Freer, MD as PCP - Cardiology (Internal Medicine) Deboraha Sprang, MD as PCP - Electrophysiology (Cardiology) Deboraha Sprang, MD (Cardiology) Melrose Nakayama, MD as Consulting Physician (Orthopedic Surgery) Biagio Borg, MD as Consulting Physician (Internal Medicine) Marica Otter, [place] as Consulting Physician (Optometry)  Indicate any recent Medical Services you may have received from other than [facility] providers in the past year (date may be approximate).     Assessment:   This is a routine wellness examination for [name].  Hearing/Vision screen Hearing Screening - Comments:: Denies hearing difficulties   Vision Screening - Comments:: Wears rx glasses - up to date  with routine eye exams with Marica Otter, OD.   Dietary issues and exercise activities discussed: Current Exercise Habits: Structured exercise class, Time (Minutes): 60, Frequency (Times/Week): 1, Weekly Exercise (Minutes/Week): 60, Intensity: Moderate, Exercise limited by: cardiac condition(s);orthopedic condition(s)   Goals Addressed             This Visit's Progress    Client understands the importance of follow-up with providers by attending scheduled visits       I would like to resume exercising at [facility]. Increase my physical activity to 3 days a week and keep my weight down.      Depression Screen    05/16/2022    1:22 PM 05/16/2022    1:08 PM 10/31/2021   11:35 AM 10/31/2021   11:33 AM 05/10/2021    1:53 PM 05/10/2021    1:27 PM 10/11/2020    1:53 PM  PHQ 2/9 Scores  PHQ - 2 Score 0  0 0 0 0 0  Exception Documentation  Patient refusal         Fall Risk    11/06/2022   11:35 AM 05/16/2022  1:21 PM 05/16/2022    1:08 PM 10/31/2021   11:35 AM 05/10/2021    1:52 PM  Fall Risk   Falls in the past year? 0 0 0 1 1  Number falls in past yr: 0 0  0 0  Injury with Fall? 0 0 0 1 1  Comment     right shoulder rot cuff  Risk for fall due to : No Fall Risks  Medication side effect    Follow up Falls prevention discussed  Falls evaluation completed Falls evaluation completed     FALL RISK PREVENTION PERTAINING TO THE HOME:  Any stairs in or around the home? Yes  If so, are there any without handrails? No  Home free of loose throw rugs in walkways, pet beds, electrical cords, etc? Yes  Adequate lighting in your home to reduce risk of falls? Yes   ASSISTIVE DEVICES UTILIZED TO PREVENT FALLS:  Life alert? No  Use of a cane, walker or w/c? No  Grab bars in the bathroom? No  Shower chair or bench in shower? No  Elevated toilet seat or a handicapped toilet? Yes   TIMED UP AND GO:  Was the test performed? No . Telephonic Visit  Cognitive Function:        11/06/2022   11:36  AM  6CIT Screen  What Year? 0 points  What month? 0 points  What time? 0 points  Count back from 20 0 points  Months in reverse 0 points  Repeat phrase 0 points  Total Score 0 points    Immunizations Immunization History  Administered Date(s) Administered   Fluad Quad(high Dose 65+) 06/25/2020, 05/10/2021, 06/17/2022   H1N1 08/11/2008   Influenza Split 06/21/2012   Influenza Whole 08/20/2008, 05/31/2010, 05/04/2013   Influenza, High Dose Seasonal PF 06/15/2014, 06/29/2018, 06/15/2019   Influenza-Unspecified 05/18/2015   PFIZER(Purple Top)SARS-COV-2 Vaccination 09/21/2019, 10/11/2019, 06/08/2020, 01/05/2021   Pfizer Covid-19 Vaccine Bivalent Booster 45yr & up 09/02/2021   Pneumococcal Conjugate-13 04/07/2015   Pneumococcal Polysaccharide-23 09/11/2005   RSV,unspecified 08/17/2022   Td 02/02/2009   Tdap 06/25/2020   Zoster Recombinat (Shingrix) 02/02/2017, 06/01/2017   Zoster, Live 03/05/2007    TDAP status: Up to date  Flu Vaccine status: Up to date  Pneumococcal vaccine status: Up to date  Covid-19 vaccine status: Completed vaccines  Qualifies for Shingles Vaccine? Yes   Zostavax completed Yes   Shingrix Completed?: Yes  Screening Tests Health Maintenance  Topic Date Due   COVID-19 Vaccine (6 - 2023-24 season) 05/12/2022   Medicare Annual Wellness (AWV)  11/07/2023   COLONOSCOPY (Pts 45-47yrInsurance coverage will need to be confirmed)  07/20/2026   DTaP/Tdap/Td (3 - Td or Tdap) 06/25/2030   Pneumonia Vaccine 6564Years old  Completed   INFLUENZA VACCINE  Completed   Zoster Vaccines- Shingrix  Completed   HPV VACCINES  Aged Out    Health Maintenance  Health Maintenance Due  Topic Date Due   COVID-19 Vaccine (6 - 2023-24 season) 05/12/2022    Colorectal cancer screening: Type of screening: Colonoscopy. Completed 07/20/2021. Repeat every 5 years  Lung Cancer Screening: (Low Dose CT Chest recommended if Age 83-80ears, 30 pack-year currently smoking OR  have quit w/in 15years.) does not qualify.   Lung Cancer Screening Referral: no  Additional Screening:  Hepatitis C Screening: does not qualify; Completed no  Vision Screening: Recommended annual ophthalmology exams for early detection of glaucoma and other disorders of the eye. Is the patient up to date with their  annual eye exam?  Yes  Who is the provider or what is the name of the office in which the patient attends annual eye exams? Marica Otter, OD. If pt is not established with a provider, would they like to be referred to a provider to establish care? No .   Dental Screening: Recommended annual dental exams for proper oral hygiene  Community Resource Referral / Chronic Care Management: CRR required this visit?  No   CCM required this visit?  No      Plan:     I have personally reviewed and noted the following in the patient's chart:   Medical and social history Use of alcohol, tobacco or illicit drugs  Current medications and supplements including opioid prescriptions. Patient is not currently taking opioid prescriptions. Functional ability and status Nutritional status Physical activity Advanced directives List of other physicians Hospitalizations, surgeries, and ER visits in previous 12 months Vitals Screenings to include cognitive, depression, and falls Referrals and appointments  In addition, I have reviewed and discussed with patient certain preventive protocols, quality metrics, and best practice recommendations. A written personalized care plan for preventive services as well as general preventive health recommendations were provided to patient.     Sheral Flow, LPN   [id number]   Nurse Notes: Normal cognitive status assessed by direct observation by this Nurse Health Advisor. No abnormalities found.

## 2022-11-06 NOTE — Patient Instructions (Signed)
[name] , Thank you for taking time to come for your Medicare Wellness Visit. I appreciate your ongoing commitment to your health goals. Please review the following plan we discussed and let me know if I can assist you in the future.   These are the goals we discussed:  Goals      Client understands the importance of follow-up with providers by attending scheduled visits     I would like to resume exercising at [facility]. Increase my physical activity to 3 days a week and keep my weight down.        This is a list of the screening recommended for you and due dates:  Health Maintenance  Topic Date Due   COVID-19 Vaccine (6 - 2023-24 season) 05/12/2022   Medicare Annual Wellness Visit  11/07/2023   Colon Cancer Screening  07/20/2026   DTaP/Tdap/Td vaccine (3 - Td or Tdap) 06/25/2030   Pneumonia Vaccine  Completed   Flu Shot  Completed   Zoster (Shingles) Vaccine  Completed   HPV Vaccine  Aged Out    Advanced directives: Yes  Conditions/risks identified: Yes  Next appointment: Follow up in one year for your annual wellness visit.   Preventive Care 49 Years and Older, Male  Preventive care refers to lifestyle choices and visits with your health care provider that can promote health and wellness. What does preventive care include? A yearly physical exam. This is also called an annual well check. Dental exams once or twice a year. Routine eye exams. Ask your health care provider how often you should have your eyes checked. Personal lifestyle choices, including: Daily care of your teeth and gums. Regular physical activity. Eating a healthy diet. Avoiding tobacco and drug use. Limiting alcohol use. Practicing safe sex. Taking low doses of aspirin every day. Taking vitamin and mineral supplements as recommended by your health care provider. What happens during an annual well check? The services and screenings done by your health care provider during your annual well check will  depend on your age, overall health, lifestyle risk factors, and family history of disease. Counseling  Your health care provider may ask you questions about your: Alcohol use. Tobacco use. Drug use. Emotional well-being. Home and relationship well-being. Sexual activity. Eating habits. History of falls. Memory and ability to understand (cognition). Work and work Statistician. Screening  You may have the following tests or measurements: Height, weight, and BMI. Blood pressure. Lipid and cholesterol levels. These may be checked every 5 years, or more frequently if you are over 86 years old. Skin check. Lung cancer screening. You may have this screening every year starting at age 19 if you have a 30-pack-year history of smoking and currently smoke or have quit within the past 15 years. Fecal occult blood test (FOBT) of the stool. You may have this test every year starting at age 38. Flexible sigmoidoscopy or colonoscopy. You may have a sigmoidoscopy every 5 years or a colonoscopy every 10 years starting at age 98. Prostate cancer screening. Recommendations will vary depending on your family history and other risks. Hepatitis C blood test. Hepatitis B blood test. Sexually transmitted disease (STD) testing. Diabetes screening. This is done by checking your blood sugar (glucose) after you have not eaten for a while (fasting). You may have this done every 1-3 years. Abdominal aortic aneurysm (AAA) screening. You may need this if you are a current or former smoker. Osteoporosis. You may be screened starting at age 38 if you are at  high risk. Talk with your health care provider about your test results, treatment options, and if necessary, the need for more tests. Vaccines  Your health care provider may recommend certain vaccines, such as: Influenza vaccine. This is recommended every year. Tetanus, diphtheria, and acellular pertussis (Tdap, Td) vaccine. You may need a Td booster every 10  years. Zoster vaccine. You may need this after age 85. Pneumococcal 13-valent conjugate (PCV13) vaccine. One dose is recommended after age 83. Pneumococcal polysaccharide (PPSV23) vaccine. One dose is recommended after age 43. Talk to your health care provider about which screenings and vaccines you need and how often you need them. This information is not intended to replace advice given to you by your health care provider. Make sure you discuss any questions you have with your health care provider. Document Released: 09/24/2015 Document Revised: 05/17/2016 Document Reviewed: 06/29/2015 Elsevier Interactive Patient Education  2017 [place] Prevention in the Home Falls can cause injuries. They can happen to people of all ages. There are many things you can do to make your home safe and to help prevent falls. What can I do on the outside of my home? Regularly fix the edges of walkways and driveways and fix any cracks. Remove anything that might make you trip as you walk through a door, such as a raised step or threshold. Trim any bushes or trees on the path to your home. Use bright outdoor lighting. Clear any walking paths of anything that might make someone trip, such as rocks or tools. Regularly check to see if handrails are loose or broken. Make sure that both sides of any steps have handrails. Any raised decks and porches should have guardrails on the edges. Have any leaves, snow, or ice cleared regularly. Use sand or salt on walking paths during winter. Clean up any spills in your garage right away. This includes oil or grease spills. What can I do in the bathroom? Use night lights. Install grab bars by the toilet and in the tub and shower. Do not use towel bars as grab bars. Use non-skid mats or decals in the tub or shower. If you need to sit down in the shower, use a plastic, non-slip stool. Keep the floor dry. Clean up any water that spills on the floor as soon as it  happens. Remove soap buildup in the tub or shower regularly. Attach bath mats securely with double-sided non-slip rug tape. Do not have throw rugs and other things on the floor that can make you trip. What can I do in the bedroom? Use night lights. Make sure that you have a light by your bed that is easy to reach. Do not use any sheets or blankets that are too big for your bed. They should not hang down onto the floor. Have a firm chair that has side arms. You can use this for support while you get dressed. Do not have throw rugs and other things on the floor that can make you trip. What can I do in the kitchen? Clean up any spills right away. Avoid walking on wet floors. Keep items that you use a lot in easy-to-reach places. If you need to reach something above you, use a strong step stool that has a grab bar. Keep electrical cords out of the way. Do not use floor polish or wax that makes floors slippery. If you must use wax, use non-skid floor wax. Do not have throw rugs and other things on the floor that  can make you trip. What can I do with my stairs? Do not leave any items on the stairs. Make sure that there are handrails on both sides of the stairs and use them. Fix handrails that are broken or loose. Make sure that handrails are as long as the stairways. Check any carpeting to make sure that it is firmly attached to the stairs. Fix any carpet that is loose or worn. Avoid having throw rugs at the top or bottom of the stairs. If you do have throw rugs, attach them to the floor with carpet tape. Make sure that you have a light switch at the top of the stairs and the bottom of the stairs. If you do not have them, ask someone to add them for you. What else can I do to help prevent falls? Wear shoes that: Do not have high heels. Have rubber bottoms. Are comfortable and fit you well. Are closed at the toe. Do not wear sandals. If you use a stepladder: Make sure that it is fully opened.  Do not climb a closed stepladder. Make sure that both sides of the stepladder are locked into place. Ask someone to hold it for you, if possible. Clearly mark and make sure that you can see: Any grab bars or handrails. First and last steps. Where the edge of each step is. Use tools that help you move around (mobility aids) if they are needed. These include: Canes. Walkers. Scooters. Crutches. Turn on the lights when you go into a dark area. Replace any light bulbs as soon as they burn out. Set up your furniture so you have a clear path. Avoid moving your furniture around. If any of your floors are uneven, fix them. If there are any pets around you, be aware of where they are. Review your medicines with your doctor. Some medicines can make you feel dizzy. This can increase your chance of falling. Ask your doctor what other things that you can do to help prevent falls. This information is not intended to replace advice given to you by your health care provider. Make sure you discuss any questions you have with your health care provider. Document Released: 06/24/2009 Document Revised: 02/03/2016 Document Reviewed: 10/02/2014 Elsevier Interactive Patient Education  2017 [employer].

## 2022-11-10 ENCOUNTER — Other Ambulatory Visit: Payer: Self-pay

## 2022-11-10 ENCOUNTER — Emergency Department (HOSPITAL_COMMUNITY): Payer: Medicare Other

## 2022-11-10 ENCOUNTER — Emergency Department (HOSPITAL_COMMUNITY)
Admission: EM | Admit: 2022-11-10 | Discharge: 2022-11-10 | Disposition: A | Discharge status: Home / Self Care | Payer: Medicare Other | Attending: Emergency Medicine | Admitting: Emergency Medicine

## 2022-11-10 DIAGNOSIS — W01190A Fall on same level from slipping, tripping and stumbling with subsequent striking against furniture, initial encounter: Secondary | ICD-10-CM | POA: Diagnosis not present

## 2022-11-10 DIAGNOSIS — Z043 Encounter for examination and observation following other accident: Secondary | ICD-10-CM | POA: Diagnosis not present

## 2022-11-10 DIAGNOSIS — S0181XA Laceration without foreign body of other part of head, initial encounter: Secondary | ICD-10-CM | POA: Insufficient documentation

## 2022-11-10 DIAGNOSIS — S0993XA Unspecified injury of face, initial encounter: Secondary | ICD-10-CM | POA: Diagnosis present

## 2022-11-10 DIAGNOSIS — Y92002 Bathroom of unspecified non-institutional (private) residence single-family (private) house as the place of occurrence of the external cause: Secondary | ICD-10-CM | POA: Diagnosis not present

## 2022-11-10 DIAGNOSIS — W19XXXA Unspecified fall, initial encounter: Secondary | ICD-10-CM

## 2022-11-10 DIAGNOSIS — Z7901 Long term (current) use of anticoagulants: Secondary | ICD-10-CM | POA: Diagnosis not present

## 2022-11-10 DIAGNOSIS — M542 Cervicalgia: Secondary | ICD-10-CM | POA: Diagnosis not present

## 2022-11-10 DIAGNOSIS — I2510 Atherosclerotic heart disease of native coronary artery without angina pectoris: Secondary | ICD-10-CM | POA: Insufficient documentation

## 2022-11-10 DIAGNOSIS — S022XXA Fracture of nasal bones, initial encounter for closed fracture: Secondary | ICD-10-CM | POA: Diagnosis not present

## 2022-11-10 LAB — CBC WITH DIFFERENTIAL/PLATELET
Abs Immature Granulocytes: 0.02 10*3/uL (ref 0.00–0.07)
Basophils Absolute: 0.1 10*3/uL (ref 0.0–0.1)
Basophils Relative: 1 %
Eosinophils Absolute: 0.3 10*3/uL (ref 0.0–0.5)
Eosinophils Relative: 4 %
HCT: 39.3 % (ref 39.0–52.0)
Hemoglobin: 13.4 g/dL (ref 13.0–17.0)
Immature Granulocytes: 0 %
Lymphocytes Relative: 18 %
Lymphs Abs: 1.1 10*3/uL (ref 0.7–4.0)
MCH: 33.9 pg (ref 26.0–34.0)
MCHC: 34.1 g/dL (ref 30.0–36.0)
MCV: 99.5 fL (ref 80.0–100.0)
Monocytes Absolute: 0.6 10*3/uL (ref 0.1–1.0)
Monocytes Relative: 10 %
Neutro Abs: 4.1 10*3/uL (ref 1.7–7.7)
Neutrophils Relative %: 67 %
Platelets: 143 10*3/uL — ABNORMAL LOW (ref 150–400)
RBC: 3.95 MIL/uL — ABNORMAL LOW (ref 4.22–5.81)
RDW: 13.6 % (ref 11.5–15.5)
WBC: 6.1 10*3/uL (ref 4.0–10.5)
nRBC: 0 % (ref 0.0–0.2)

## 2022-11-10 LAB — COMPREHENSIVE METABOLIC PANEL
ALT: 25 U/L (ref 0–44)
AST: 28 U/L (ref 15–41)
Albumin: 4 g/dL (ref 3.5–5.0)
Alkaline Phosphatase: 38 U/L (ref 38–126)
Anion gap: 7 (ref 5–15)
BUN: 15 mg/dL (ref 8–23)
CO2: 27 mmol/L (ref 22–32)
Calcium: 9.9 mg/dL (ref 8.9–10.3)
Chloride: 108 mmol/L (ref 98–111)
Creatinine, Ser: 1.15 mg/dL (ref 0.61–1.24)
GFR, Estimated: >60 mL/min (ref >60)
Glucose, Bld: 109 mg/dL — ABNORMAL HIGH (ref 70–99)
Potassium: 4 mmol/L (ref 3.5–5.1)
Sodium: 142 mmol/L (ref 135–145)
Total Bilirubin: 0.8 mg/dL (ref 0.3–1.2)
Total Protein: 6.7 g/dL (ref 6.5–8.1)

## 2022-11-10 LAB — SAMPLE TO BLOOD BANK

## 2022-11-10 LAB — I-STAT CHEM 8, ED
BUN: 17 mg/dL (ref 8–23)
Calcium, Ion: 1.33 mmol/L (ref 1.15–1.40)
Chloride: 107 mmol/L (ref 98–111)
Creatinine, Ser: 1.2 mg/dL (ref 0.61–1.24)
Glucose, Bld: 103 mg/dL — ABNORMAL HIGH (ref 70–99)
HCT: 39 % (ref 39.0–52.0)
Hemoglobin: 13.3 g/dL (ref 13.0–17.0)
Potassium: 4.1 mmol/L (ref 3.5–5.1)
Sodium: 144 mmol/L (ref 135–145)
TCO2: 26 mmol/L (ref 22–32)

## 2022-11-10 LAB — PROTIME-INR
INR: 1.4 — ABNORMAL HIGH (ref 0.8–1.2)
Prothrombin Time: 16.5 seconds — ABNORMAL HIGH (ref 11.4–15.2)

## 2022-11-10 LAB — LACTIC ACID, PLASMA: Lactic Acid, Venous: 0.7 mmol/L (ref 0.5–1.9)

## 2022-11-10 LAB — ETHANOL: Alcohol, Ethyl (B): <10 mg/dL (ref <10)

## 2022-11-10 MED ORDER — LIDOCAINE HCL (PF) 1 % IJ SOLN
5.0000 mL | Freq: Once | INTRAMUSCULAR | Status: AC
  Administered 2022-11-10: 5 mL
  Filled 2022-11-10: qty 5

## 2022-11-10 NOTE — Progress Notes (Signed)
Orthopedic Tech Progress Note Patient Details:  [name] [birth year] [id number]  Level 2 trauma   Patient ID: [name], male   DOB: Aug 23, 1941, 82 y.o.   MRN: [id number]  [name] 11/10/2022, 6:47 AM

## 2022-11-10 NOTE — ED Notes (Signed)
Reviewed discharge instructions with patient and wife. Follow-up care and medications reviewed. Patient and wife verbalized understanding. Patient A&Ox4, VSS, and ambulatory with steady gait upon discharge.

## 2022-11-10 NOTE — ED Triage Notes (Signed)
Patient lost his balance and fell at home this evening , hit his face against the bedside cabinet , no LOC , presents with epistaxis and nasal swelling . He takes Eliquis for Afib . Level 2 trauma activation initiated at triage .

## 2022-11-10 NOTE — ED Notes (Signed)
Pt to CT

## 2022-11-10 NOTE — ED Notes (Signed)
Pt back from CT

## 2022-11-10 NOTE — ED Notes (Signed)
Trauma Response Nurse Documentation   [name] is a 82 y.o. male arriving to [facility] ED via POV  On Eliquis (apixaban) daily. Trauma was activated as a Level 2 by Karlyne Greenspan based on the following trauma criteria Elderly patients > 65 with head trauma on anti-coagulation (excluding ASA). Trauma team at the bedside on patient arrival.   Patient cleared for CT by Dr. Christy Gentles. Pt transported to CT with Primary nurse present to monitor. RN remained with the patient throughout their absence from the department for clinical observation.   GCS 15.  History   Past Medical History:  Diagnosis Date   Allergic rhinitis    takes Allegra daily   Allergic rhinitis, cause unspecified 06/22/2012   Allergy    Arthritis    Atrial fibrillation ([place])    takes Tikosyn and Eliquis daily   Bilateral popliteal artery aneurysm (HCC)    Chronic kidney disease (CKD), stage III (moderate) (HCC)    Clotting disorder (HCC)    Coronary artery disease    LAD stenting 2004 non-DES   Depression    took Zoloft 52yr ago but nothing now   Diverticulosis    DVT (deep venous thrombosis) (HCC)    left popliteal artery greater than 10 years ago   Dysphagia    occasionally   History of blood clots 2003   left leg prior to fem pop   History of colon polyps    Hyperlipidemia    takes Tricor and Lipitor daily   Insomnia    takes Ambien nightly as needed   Joint pain    Joint swelling    Neuromuscular disorder (HCC)    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   OSA (obstructive sleep apnea)    CPAP   Pacemaker    Peripheral vascular disease ([place]    Pleurisy    early 80's   Prostatitis    Sleep apnea    Urinary urgency      Past Surgical History:  Procedure Laterality Date   ATRIAL FIBRILLATION ABLATION N/A 06/30/2021   Procedure: ATRIAL FIBRILLATION ABLATION;  Surgeon: AThompson Grayer MD;  [facility];  Service: Cardiovascular;  Laterality: N/A;   ATRIAL  FIBRILLATION ABLATION N/A 01/09/2022   Procedure: ATRIAL FIBRILLATION ABLATION;  Surgeon: AThompson Grayer MD;  [facility];  Service: Cardiovascular;  Laterality: N/A;   CARDIAC CATHETERIZATION  09/11/2002   with 2 stents   CARDIAC CATHETERIZATION N/A 05/06/2015   Procedure: Left Heart Cath and Coronary Angiography;  Surgeon: HBelva Crome MD;  [facility];  Service: Cardiovascular;  Laterality: N/A;   CARDIOVERSION N/A 12/18/2020   Procedure: CARDIOVERSION;  Surgeon: NJosue Hector MD;  [facility]  Service: Cardiovascular;  Laterality: N/A;   COLONOSCOPY     hand and arm surgery Right    as a teenager   HAND SURGERY Left    INGUINAL HERNIA REPAIR Right    IR RADIOLOGIST EVAL & MGMT  01/05/2021   JOINT REPLACEMENT Left 12/09/2013   Left Hip    JOINT REPLACEMENT Right 05/05/2014   Right Hip   left knee surgery     PACEMAKER INSERTION  09/25/1996   due to bradycardia   PERMANENT PACEMAKER GENERATOR CHANGE N/A 06/17/2012   Procedure: PERMANENT PACEMAKER GENERATOR CHANGE;  Surgeon: SDeboraha Sprang MD;  [facility];  Service: Cardiovascular;  Laterality: N/A;   right and left fem-pop  bypass     SHOULDER ARTHROSCOPY WITH ROTATOR CUFF REPAIR Right 05/31/2021   Procedure: RIGHT SHOULDER ARTHROSCOPY ACROMIOPLASTY AND DEBRIDEMENT;  Surgeon: Melrose Nakayama, MD;  Location: [facility];  Service: Orthopedics;  Laterality: Right;   TOTAL HIP ARTHROPLASTY Left 12/09/2013   Procedure: TOTAL HIP ARTHROPLASTY ANTERIOR APPROACH;  Surgeon: Hessie Dibble, MD;  Location: [place];  Service: Orthopedics;  Laterality: Left;  left anterior total hip arthroplasty   TOTAL HIP ARTHROPLASTY Right 05/05/2014   Procedure: TOTAL HIP ARTHROPLASTY ANTERIOR APPROACH;  Surgeon: Hessie Dibble, MD;  Location: [place];  Service: Orthopedics;  Laterality: Right;   TURBINATE REDUCTION     wisdom extracted          Initial Focused Assessment (If applicable, or please see  trauma documentation): Airway--intact, no obstruction Breathing-- spontaneous, unlabored Circulation-- abrasion to forehead, chin bleeding controlled. Nosebleed slowed on arrival.  CT's Completed:   CT Head, CT Maxillofacial, and CT C-Spine   Interventions:  See event summary.  Plan for disposition:  Unknown at this time.  Consults completed:  None at time of this note.  Event Summary: Patient coming in POV from home. Tripped and fell this morning and hit face on nightstand. Patient with abrasion to forehead, laceration to chin. Patient also reports nosebleed that has slowed on arrival to department. Manual BP obtained. Trauma labs obtained. Xray chest completed. CT head, c-spine, maxillofacial to be completed.   Bedside handoff with ED RN Arnett.    Trudee Kuster  Trauma Response RN  Please call TRN at [phone] for further assistance.

## 2022-11-10 NOTE — ED Provider Notes (Signed)
**[name] [name] via Obfuscation** [facility] Provider [name]   CSN: [id number] Arrival date & time: 11/10/22  [id number]     History Afib, CAD Chief Complaint  Patient presents with   Fall    Takes Eliquis    [name] [name] is a 82 y.o. male.  82 y.o male with a PMH of Afib, CAD, PVD, presents to the [name] via POV s/p fall. Patient reports he's had diarrhea for the last couple of days, states today he tried to use the bathroom around 4 o clock this morning when he tuned to close the door [name] fell striking his head [name] face along the corner edge of a table. He reports no LOC; is having pain along his forehead, nose, chin. He got himself up from the floor, took a shower [name] came to the [name] for further evaluation.Has [name] taken any medication for pain control. He is anticoagulated on Eliquis, although he reports a recent ablation, followed by Dr. Caryl Comes. He denies any dizziness, headache, chest pain or other complaints.  Last Tdap in 2021 per chart review.   The history is [name] by the patient.  Fall This is a new problem. The current episode started 1 to 2 hours ago. The problem occurs rarely. Pertinent negatives include no chest pain, no abdominal pain, no headaches [name] no shortness of breath. Nothing aggravates the symptoms. Nothing relieves the symptoms. He has tried nothing for the symptoms.       Home Medications Prior to Admission medications   Medication Sig Start Date End Date Taking? Authorizing Provider  acetaminophen (TYLENOL) 500 MG tablet Take 500 mg by mouth every 6 (six) hours as needed (pain/headaches).    [provider]  albuterol (PROVENTIL) (2.5 MG/3ML) 0.083% nebulizer solution Take 3 mLs (2.5 mg total) by nebulization every 6 (six) hours as needed for wheezing or shortness of breath. 09/07/21   Freddi Starr, MD  atorvastatin (LIPITOR) 80 MG tablet TAKE 1 TABLET BY MOUTH  DAILY 08/09/21   Deboraha Sprang, MD  carvedilol (COREG) 25 MG tablet TAKE 2  TABLETS BY MOUTH TWICE  DAILY 07/24/22   Deboraha Sprang, MD  Coenzyme Q10 300 MG CAPS Take 300 mg by mouth in the morning.    [provider]  Cyanocobalamin (VITAMIN B 12) 500 MCG TABS Take 500 mcg by mouth daily. 1/4 tab weekly    [provider]  ELIQUIS 5 MG TABS tablet TAKE 1 TABLET BY MOUTH TWICE  DAILY 09/18/22   Allred, Jeneen Rinks, MD  fenofibrate 160 MG tablet TAKE 1 TABLET BY MOUTH DAILY 09/18/22   Deboraha Sprang, MD  fexofenadine (ALLEGRA) 180 MG tablet Take 90 mg by mouth 2 (two) times daily.    [provider]  Fluocinonide Emulsified Base 0.05 % CREA APPLY TO AFFECTED AREA(S)  TWO TIMES DAILY AS NEEDED 01/16/19   Biagio Borg, MD  folic acid (FOLVITE) A999333 MCG tablet Take 400 mcg by mouth in the morning.    [provider]  Lysine 500 MG TABS Take 500 mg by mouth 3 (three) times daily.    [provider]  Magnesium 250 MG TABS Take 125 mg by mouth 2 (two) times daily. With vitamin d    [provider]  triamcinolone (NASACORT) 55 MCG/ACT AERO nasal inhaler Place 2 sprays into the nose in the morning.    [provider]      Allergies    Amiodarone, Niacin, Ace inhibitors, [name] Mometasone  furoate    Review of Systems   Review of Systems  Constitutional:  Negative for chills [name] fever.  Respiratory:  Negative for shortness of breath.   Cardiovascular:  Negative for chest pain.  Gastrointestinal:  Negative for abdominal pain, nausea [name] vomiting.  Genitourinary:  Negative for flank pain.  Musculoskeletal:  Positive for myalgias, neck pain [name] neck stiffness. Negative for back pain.  Skin:  Negative for pallor [name] wound.  Neurological:  Negative for light-headedness [name] headaches.  All other systems reviewed [name] are negative.   Physical Exam Updated Vital Signs BP 116/61   Pulse 70   Temp 98 F (36.7 C) (Oral)   Resp 18   Ht '6\' 3"'$  (1.905 m)   Wt 91.6 kg   SpO2 100%   BMI 25.25 kg/m  Physical Exam Vitals [name]  nursing [name] reviewed.  Constitutional:      Appearance: Normal appearance.  HENT:     Head: Normocephalic.     Comments: Abrasion to the forehead, nose is bruise along with swollen at the ridge. 1.5 cm laceration to the chin.     Nose: Congestion present.     Comments: Blood on the right nare.     Mouth/Throat:     Mouth: Mucous membranes are moist.  Eyes:     Extraocular Movements: Extraocular movements intact.     Pupils: Pupils are equal, round, [name] reactive to light.     Comments: Pupils are equal [name] reactive. EOMs intact.   Neck:     Comments: TTP along C7, with some paraspinal pain to the right side as well. No bruising or hematoma noted.  Cardiovascular:     Rate [name] Rhythm: Normal rate.     Heart sounds: No murmur heard. Pulmonary:     Effort: Pulmonary effort is normal.     Breath sounds: No wheezing or rales.  Abdominal:     General: Abdomen is flat.     Palpations: Abdomen is soft.     Tenderness: There is no abdominal tenderness.  Musculoskeletal:     Cervical back: Normal range of motion [name] neck supple. Tenderness present.  Skin:    General: Skin is warm [name] dry.  Neurological:     Mental Status: He is alert [name] oriented to person, place, [name] time.     Comments: Alert, oriented, thought content appropriate. Speech fluent without evidence of aphasia. Able to follow 2 step commands without difficulty.  Cranial Nerves:  II:  Peripheral visual fields grossly normal, pupils, round, reactive to light III,IV, VI: ptosis [name] present, extra-ocular motions intact bilaterally  V,VII: smile symmetric, facial light touch sensation equal VIII: hearing grossly normal bilaterally  IX,X: midline uvula rise  XI: bilateral shoulder shrug equal [name] strong XII: midline tongue extension  Motor:  5/5 in upper [name] lower extremities bilaterally including strong [name] equal grip strength [name] dorsiflexion/plantar flexion Sensory: light touch normal in all extremities.  Cerebellar:  normal finger-to-nose with bilateral upper extremities, pronator drift negative Gait: N/A        [name] Results / Procedures / Treatments   Labs (all labs ordered are listed, but only abnormal results are displayed) Labs Reviewed  CBC WITH DIFFERENTIAL/PLATELET - Abnormal; Notable for the following components:      Result Value   RBC 3.95 (*)    Platelets 143 (*)    All other components within normal limits  COMPREHENSIVE METABOLIC PANEL - Abnormal; Notable for the following components:   Glucose, Bld 109 (*)  All other components within normal limits  PROTIME-INR - Abnormal; Notable for the following components:   Prothrombin Time 16.5 (*)    INR 1.4 (*)    All other components within normal limits  I-STAT CHEM 8, [name] - Abnormal; Notable for the following components:   Glucose, Bld 103 (*)    All other components within normal limits  ETHANOL  LACTIC ACID, PLASMA  URINALYSIS, ROUTINE W REFLEX MICROSCOPIC  SAMPLE TO BLOOD BANK    EKG EKG Interpretation  Date/Time:  Friday November 10 2022 06:37:45 EST Ventricular Rate:  75 PR Interval:    QRS Duration: 146 QT Interval:  434 QTC Calculation: 485 R Axis:   -1 Text Interpretation: ATRIAL PACED RHYTHM Left bundle branch block Confirmed by Ripley Fraise [phone]) on 11/10/2022 6:48:27 AM  Radiology CT HEAD WO CONTRAST (5MM)  Result Date: 11/10/2022 CLINICAL DATA:  Fall with abrasion EXAM: CT HEAD WITHOUT CONTRAST CT MAXILLOFACIAL WITHOUT CONTRAST CT CERVICAL SPINE WITHOUT CONTRAST TECHNIQUE: Multidetector CT imaging of the head, cervical spine, [name] maxillofacial structures were performed using the standard protocol without intravenous contrast. Multiplanar CT image reconstructions of the cervical spine [name] maxillofacial structures were also generated. RADIATION DOSE REDUCTION: This exam was performed according to the departmental dose-optimization program which includes automated exposure control, adjustment of the mA [name]/or kV  according to patient size [name]/or use of iterative reconstruction technique. COMPARISON:  10/26/2020 FINDINGS: CT HEAD FINDINGS Brain: No evidence of acute infarction, hemorrhage, hydrocephalus, extra-axial collection or mass lesion/mass effect. Generalized atrophy. Vascular: No hyperdense vessel or unexpected calcification. Skull: Normal. Negative for fracture or focal lesion. CT MAXILLOFACIAL FINDINGS Osseous: Mildly displaced left nasal bone fracture. Chronic nasal septal perforation. There is likely anterior nasal septal injury given the degree of mucosal gas the interface with the nasal bones. The mandible is intact [name] located. Orbits: No evidence of injury Sinuses: Negative for hemosinus. Soft tissues: Soft tissue swelling over the nose. CT CERVICAL SPINE FINDINGS Alignment: Normal. Skull base [name] vertebrae: No acute fracture. No primary bone lesion or focal pathologic process. Soft tissues [name] spinal canal: No prevertebral fluid or swelling. No visible canal hematoma. Disc levels: Generalized degenerative spurring with multilevel facet ankylosis. Upper chest: No acute finding IMPRESSION: 1. No evidence of acute intracranial or cervical spine injury. 2. Mildly displaced left nasal bone fracture. Electronically Signed   By: Jorje [name] M.D.   On: 11/10/2022 07:11   CT Cervical Spine Wo Contrast  Result Date: 11/10/2022 CLINICAL DATA:  Fall with abrasion EXAM: CT HEAD WITHOUT CONTRAST CT MAXILLOFACIAL WITHOUT CONTRAST CT CERVICAL SPINE WITHOUT CONTRAST TECHNIQUE: Multidetector CT imaging of the head, cervical spine, [name] maxillofacial structures were performed using the standard protocol without intravenous contrast. Multiplanar CT image reconstructions of the cervical spine [name] maxillofacial structures were also generated. RADIATION DOSE REDUCTION: This exam was performed according to the departmental dose-optimization program which includes automated exposure control, adjustment of the mA [name]/or kV  according to patient size [name]/or use of iterative reconstruction technique. COMPARISON:  10/26/2020 FINDINGS: CT HEAD FINDINGS Brain: No evidence of acute infarction, hemorrhage, hydrocephalus, extra-axial collection or mass lesion/mass effect. Generalized atrophy. Vascular: No hyperdense vessel or unexpected calcification. Skull: Normal. Negative for fracture or focal lesion. CT MAXILLOFACIAL FINDINGS Osseous: Mildly displaced left nasal bone fracture. Chronic nasal septal perforation. There is likely anterior nasal septal injury given the degree of mucosal gas the interface with the nasal bones. The mandible is intact [name] located. Orbits: No evidence of injury Sinuses: Negative for hemosinus.  Soft tissues: Soft tissue swelling over the nose. CT CERVICAL SPINE FINDINGS Alignment: Normal. Skull base [name] vertebrae: No acute fracture. No primary bone lesion or focal pathologic process. Soft tissues [name] spinal canal: No prevertebral fluid or swelling. No visible canal hematoma. Disc levels: Generalized degenerative spurring with multilevel facet ankylosis. Upper chest: No acute finding IMPRESSION: 1. No evidence of acute intracranial or cervical spine injury. 2. Mildly displaced left nasal bone fracture. Electronically Signed   By: Jorje [name] M.D.   On: 11/10/2022 07:11   CT Maxillofacial Wo Contrast  Result Date: 11/10/2022 CLINICAL DATA:  Fall with abrasion EXAM: CT HEAD WITHOUT CONTRAST CT MAXILLOFACIAL WITHOUT CONTRAST CT CERVICAL SPINE WITHOUT CONTRAST TECHNIQUE: Multidetector CT imaging of the head, cervical spine, [name] maxillofacial structures were performed using the standard protocol without intravenous contrast. Multiplanar CT image reconstructions of the cervical spine [name] maxillofacial structures were also generated. RADIATION DOSE REDUCTION: This exam was performed according to the departmental dose-optimization program which includes automated exposure control, adjustment of the mA [name]/or kV  according to patient size [name]/or use of iterative reconstruction technique. COMPARISON:  10/26/2020 FINDINGS: CT HEAD FINDINGS Brain: No evidence of acute infarction, hemorrhage, hydrocephalus, extra-axial collection or mass lesion/mass effect. Generalized atrophy. Vascular: No hyperdense vessel or unexpected calcification. Skull: Normal. Negative for fracture or focal lesion. CT MAXILLOFACIAL FINDINGS Osseous: Mildly displaced left nasal bone fracture. Chronic nasal septal perforation. There is likely anterior nasal septal injury given the degree of mucosal gas the interface with the nasal bones. The mandible is intact [name] located. Orbits: No evidence of injury Sinuses: Negative for hemosinus. Soft tissues: Soft tissue swelling over the nose. CT CERVICAL SPINE FINDINGS Alignment: Normal. Skull base [name] vertebrae: No acute fracture. No primary bone lesion or focal pathologic process. Soft tissues [name] spinal canal: No prevertebral fluid or swelling. No visible canal hematoma. Disc levels: Generalized degenerative spurring with multilevel facet ankylosis. Upper chest: No acute finding IMPRESSION: 1. No evidence of acute intracranial or cervical spine injury. 2. Mildly displaced left nasal bone fracture. Electronically Signed   By: Jorje [name] M.D.   On: 11/10/2022 07:11   DG Chest Portable 1 View  Result Date: 11/10/2022 CLINICAL DATA:  Fall. EXAM: PORTABLE CHEST 1 VIEW COMPARISON:  12/20/2020 FINDINGS: Mild cardiomegaly. Unremarkable mediastinal contours. Dual-chamber pacer leads from the left in stable position (the distal ventricular lead is [name] covered). There is no edema, consolidation, effusion, or pneumothorax. IMPRESSION: No evidence of active disease. Electronically Signed   By: Jorje [name] M.D.   On: 11/10/2022 07:00    Procedures .Marland KitchenLaceration Repair  Date/Time: 11/10/2022 7:54 AM  Performed by: Janeece Fitting, PA-C Authorized by: Janeece Fitting, PA-C   Consent:    Consent obtained:  Verbal    Consent given by:  Patient   Risks discussed:  Infection Universal protocol:    Patient identity confirmed:  Verbally with patient Anesthesia:    Anesthesia method:  Local infiltration   Local anesthetic:  Lidocaine 1% w/o epi Laceration details:    Location:  Face   Face location:  Chin   Length (cm):  1.5   Depth (mm):  0.5 Treatment:    Area cleansed with:  Saline   Amount of cleaning:  Extensive Skin repair:    Repair method:  Sutures   Suture size:  5-0   Suture material:  Prolene   Suture technique:  Simple interrupted   Number of sutures:  4 Approximation:    Approximation:  Close Repair type:  Repair type:  Simple Post-procedure details:    Dressing:  Open (no dressing)   Procedure completion:  Tolerated well, no immediate complications     Medications Ordered in [name] Medications  lidocaine (PF) (XYLOCAINE) 1 % injection 5 mL (5 mLs Infiltration Given by Other 11/10/22 0800)    [name] Course/ Medical Decision Making/ A&P Clinical Course as of 11/10/22 0811  Fri Nov 10, 2022  0758 Potassium: 4.0 [JS]    Clinical Course User Index [JS] Janeece Fitting, PA-C                             Medical Decision Making Amount [name]/or Complexity of Data Reviewed Labs: ordered. Decision-making details documented in [name] Course. Radiology: ordered.  Risk Prescription drug management.   Patient presents to the [name] with a chief complaint of s/p fall on thinners, LEVEL 2 trauma activated in the waiting room. On evaluation patient is overall hemodynamically stable, he did take a shower after his fall [name] has been ambulatory. No LOC during this fall. He has been having diarrhea over the last couple of days, therefore a CMP [name] CBC were also checked. CT head, CT cervical spine, CT maxillofacial ordered.His neuro exam is unremarkable. He did shower after the incident [name] entered the [name] with a steady gate.   CT head: 1. No evidence of acute intracranial or cervical spine injury.  2.  Mildly displaced left nasal bone fracture.  DG Chest showed: no acute findings.  CT Maxillofacial: with a left nasal bone fracture, referred to ENT on call.   I have repaired patient wound to his chin with 4 stitches to the chin, he tolerated the procedure well.  His CMP without any electrolyte abnormality despite ongoing diarrhea. Last tetanus immunization was two years ago per chart review. This was discussed with patient at length. He remains hemodynamically stable.    Portions of this [name] were generated with Lobbyist. Dictation errors may occur despite best attempts at proofreading.   Final Clinical Impression(s) / [name] Diagnoses Final diagnoses:  Fall, initial encounter  Facial laceration, initial encounter    Rx / DC Orders [name] Discharge Orders     [name]         Janeece Fitting, PA-C 11/10/22 [place] [name], [place], DO 11/13/22 1601

## 2022-11-10 NOTE — Discharge Instructions (Addendum)
The CT of  your  head and neck did not show any acute findings.   I have placed 4 stiches to your chin, you will need to have these removed in 5-7 days.   You may take tylenol to help with pain control.

## 2022-11-13 NOTE — Progress Notes (Signed)
Depression screening was completed on date of service.  Patient stated no issues with depression.  Yazleemar Strassner N. Kalik Hoare, LPN. [facility] Team Direct Dial: [phone]

## 2022-11-14 ENCOUNTER — Other Ambulatory Visit: Payer: Self-pay

## 2022-11-14 DIAGNOSIS — I739 Peripheral vascular disease, unspecified: Secondary | ICD-10-CM

## 2022-11-14 DIAGNOSIS — Z8249 Family history of ischemic heart disease and other diseases of the circulatory system: Secondary | ICD-10-CM

## 2022-11-16 ENCOUNTER — Telehealth: Payer: Self-pay | Admitting: *Deleted

## 2022-11-16 NOTE — Telephone Encounter (Signed)
        Patient  visited [facility] on 11/10/2022  for treatment   Telephone encounter attempt :  1st  A HIPAA compliant voice message was left requesting a return call.  Instructed patient to call back at [phone]. [place] [phone] [address] , [place] [address] Email : [email or web address]

## 2022-11-17 DIAGNOSIS — J3489 Other specified disorders of nose and nasal sinuses: Secondary | ICD-10-CM | POA: Diagnosis not present

## 2022-11-17 DIAGNOSIS — S022XXD Fracture of nasal bones, subsequent encounter for fracture with routine healing: Secondary | ICD-10-CM | POA: Diagnosis not present

## 2022-11-17 DIAGNOSIS — S022XXA Fracture of nasal bones, initial encounter for closed fracture: Secondary | ICD-10-CM | POA: Insufficient documentation

## 2022-11-17 DIAGNOSIS — Z7901 Long term (current) use of anticoagulants: Secondary | ICD-10-CM | POA: Diagnosis not present

## 2022-11-20 ENCOUNTER — Ambulatory Visit (INDEPENDENT_AMBULATORY_CARE_PROVIDER_SITE_OTHER): Payer: Medicare Other | Admitting: Pulmonary Disease

## 2022-11-20 DIAGNOSIS — J849 Interstitial pulmonary disease, unspecified: Secondary | ICD-10-CM

## 2022-11-20 NOTE — Progress Notes (Signed)
Six Minute Walk - 11/20/22 1430       Six Minute Walk   Medications taken before test (dose and time) albuterol around 1145am, Lipitor '80mg'$ , Eliquis '5mg'$ , Carvedilol '25mg'$ , CoQ10 '300mg'$ ,  Vitamin B12, Allegra '180mg'$ , fenofibrate '160mg'$ , folic acid 0000000 and lysine '500mg'$  were all taken this morning around 8am    Supplemental oxygen during test? No    Lap distance in meters  34 meters    Laps Completed 10    Partial lap (in meters) 47 meters    Baseline BP (sitting) 118/72    Baseline Heartrate 62    Baseline Dyspnea (Borg Scale) 0    Baseline Fatigue (Borg Scale) 2    Baseline SPO2 98 %      End of Test Values    BP (sitting) 126/78    Heartrate 74    Dyspnea (Borg Scale) 2    Fatigue (Borg Scale) 3    SPO2 99 %      2 Minutes Post Walk Values   BP (sitting) 128/74    Heartrate 60    SPO2 98 %    Stopped or paused before six minutes? No      Interpretation   Distance completed 387 meters    Tech Comments: Patient was able to complete walk at a steady pace. He did have some unsteady gait towards the end of the test but he stated that this was normal for him. He denied any chest pain, leg pain or any SOB during or after walk. No O2 needed during or after walk.

## 2022-11-21 ENCOUNTER — Other Ambulatory Visit: Payer: Self-pay | Admitting: Internal Medicine

## 2022-11-22 IMAGING — CT CT ABD-PELV W/ CM
2 of 5 series · 16 of 46 positions shown, 18 images · IV contrast (APPLIED)
Comparison: December 15, 2020.

CLINICAL DATA: Abdominal pain.

EXAM:
CT ABDOMEN AND PELVIS WITH CONTRAST
TECHNIQUE: Multidetector CT imaging of the abdomen and pelvis was performed
using the standard protocol following bolus administration of
intravenous contrast.
CONTRAST:  100mL OMNIPAQUE IOHEXOL 300 MG/ML  SOLN

[Series 3: abd/ pelvis 5.0 i30f 2 (person_name) · axial · 0.88mm/px · z∈[+739,+1239]mm · 13 of 112 slices shown, 15 images]
[im 6/112  soft-tissue]
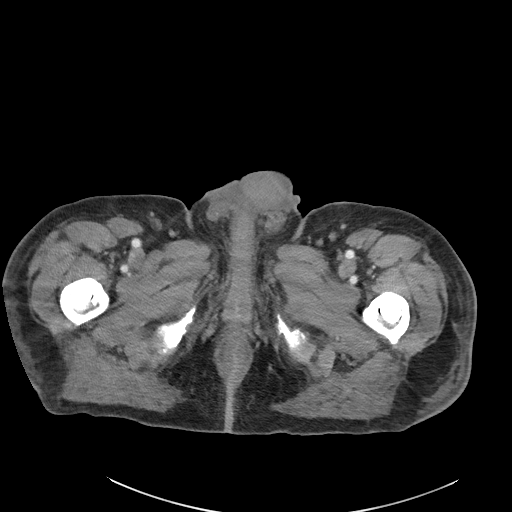
[im 6/112  bone]
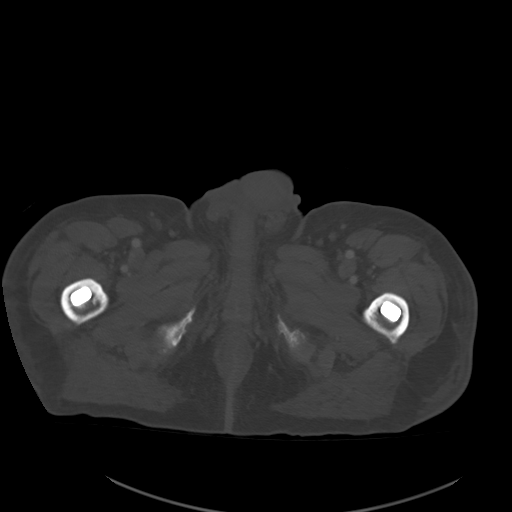
[im 17/112  soft-tissue]
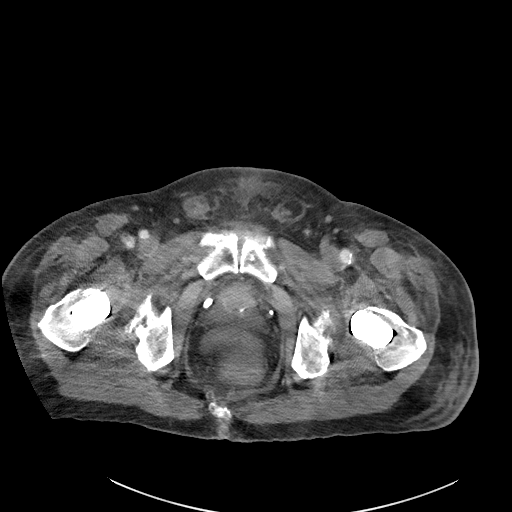
[im 23/112  soft-tissue]
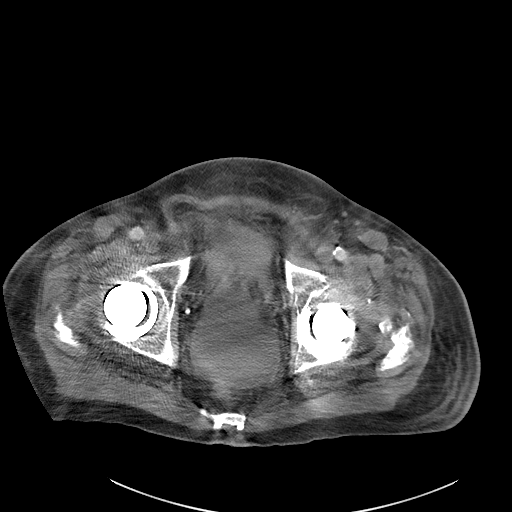
[im 34/112  soft-tissue]
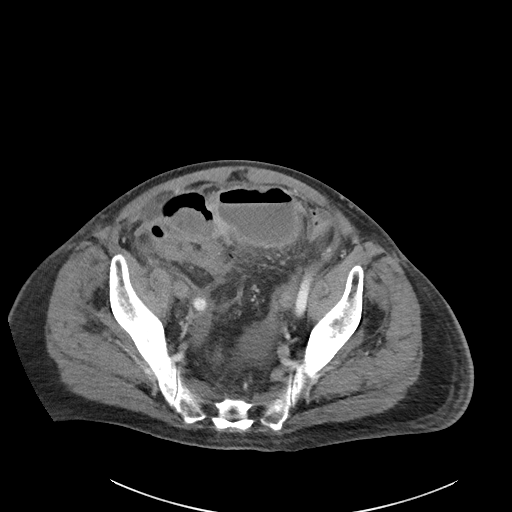
[im 39/112  soft-tissue]
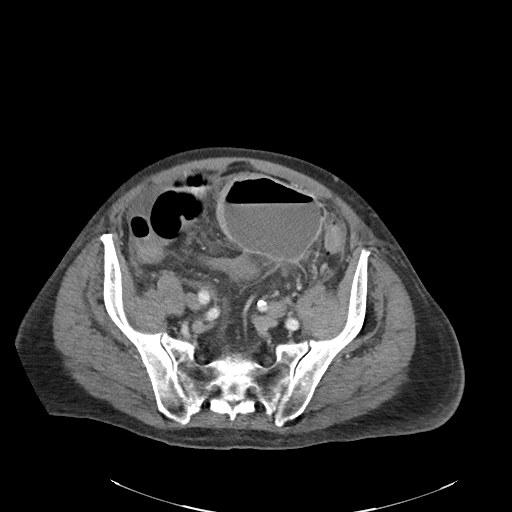
[im 50/112  soft-tissue]
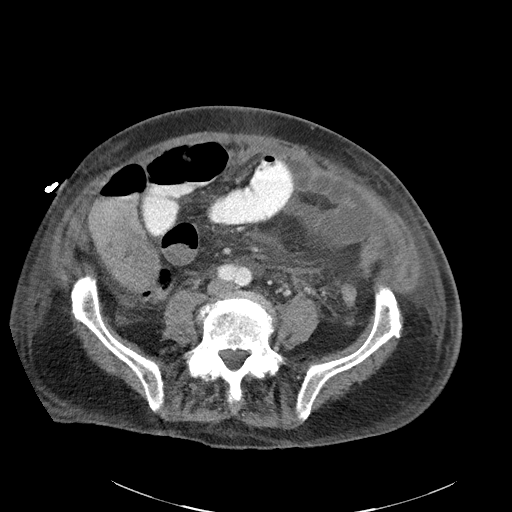
[im 56/112  soft-tissue]
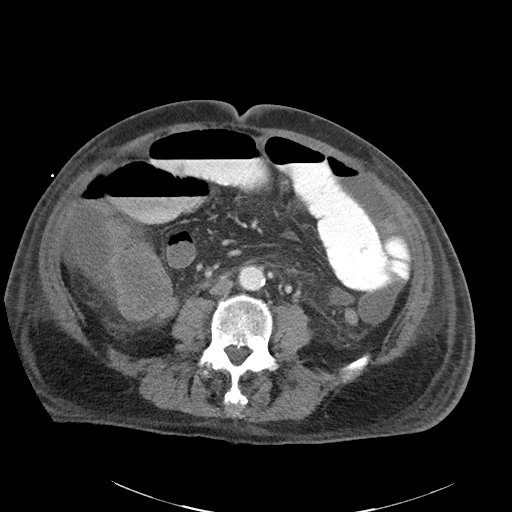
[im 62/112  soft-tissue]
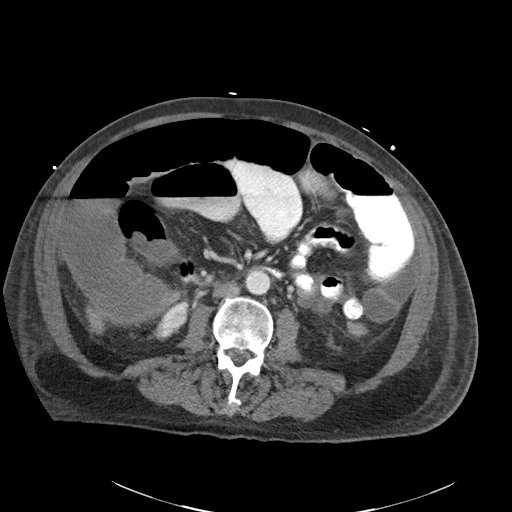
[im 73/112  soft-tissue]
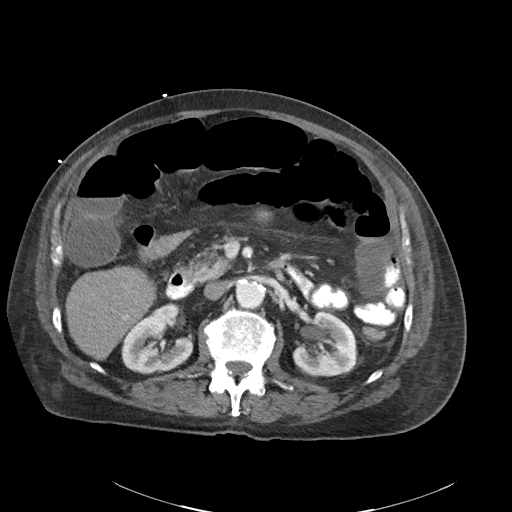
[im 73/112  bone]
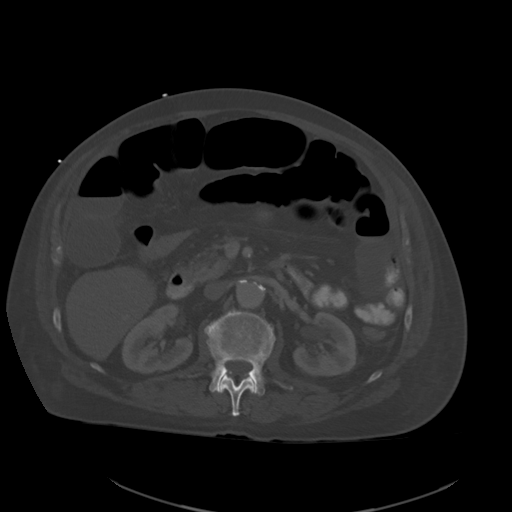
[im 78/112  soft-tissue]
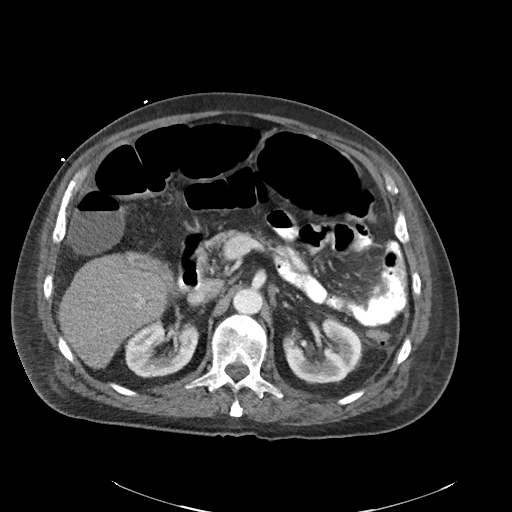
[im 89/112  soft-tissue]
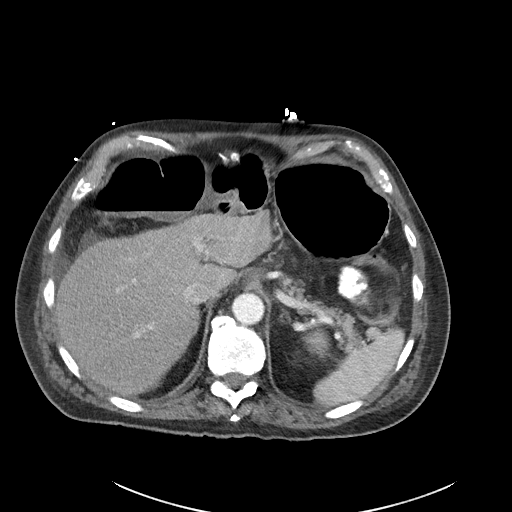
[im 95/112  soft-tissue]
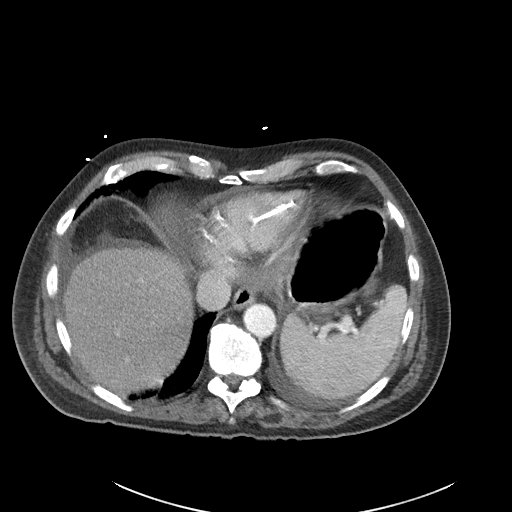
[im 106/112  soft-tissue]
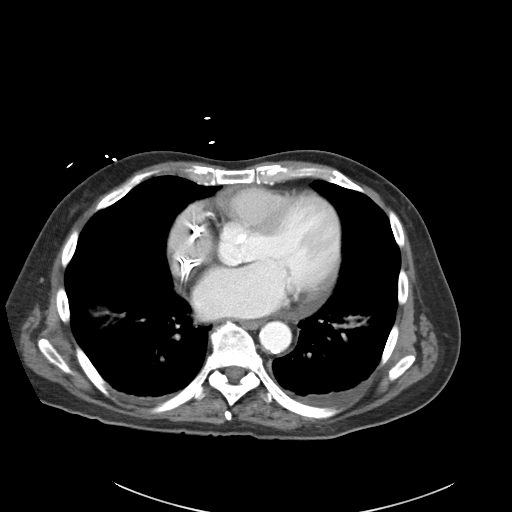

[Series 6: coronal soft tissue · coronal · 0.90mm/px · 3 of 121 slices shown]
[im 54/121  soft-tissue]
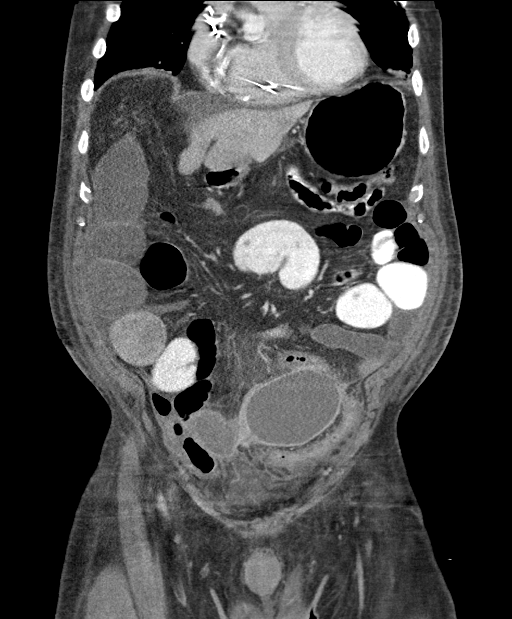
[im 67/121  soft-tissue]
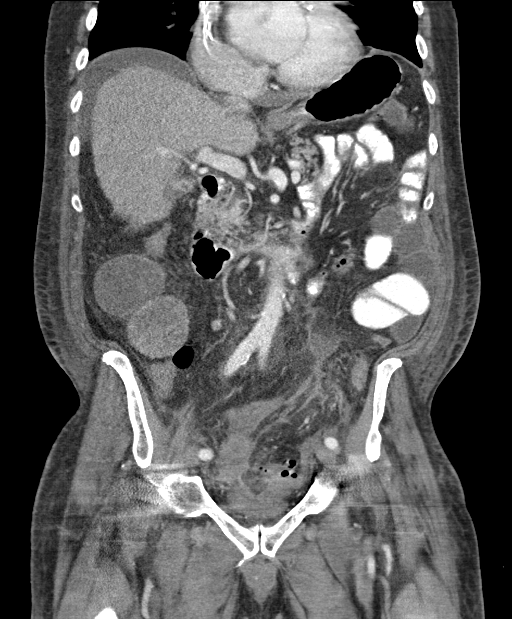
[im 81/121  soft-tissue]
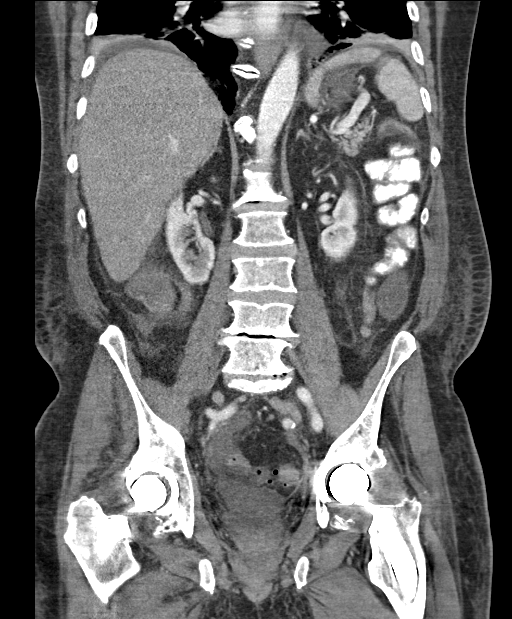

[16 of 46 positions shown; findings below may reference images not displayed]

FINDINGS: Lower chest: No acute abnormality.

Hepatobiliary: No gallstones or biliary dilatation is noted.
Probable hepatic cirrhosis is noted with minimal surrounding
ascites.

Pancreas: Unremarkable. No pancreatic ductal dilatation or
surrounding inflammatory changes.

Spleen: Normal in size without focal abnormality.

Adrenals/Urinary Tract: Adrenal glands appear normal. Left renal
cyst is noted. No hydronephrosis or renal obstruction is noted. No
renal or ureteral calculi are noted. Urinary bladder is
decompressed.

Stomach/Bowel: Mild gastric distention is noted. There is
significantly worsened small bowel dilatation concerning for
obstruction. There is interval development of 10.9 x 6.6 cm fluid
collection in the left lower quadrant of the pelvis concerning for
abscess. Continued findings are noted consistent with sigmoid
diverticulitis which appears to have progressed.

Vascular/Lymphatic: Aortic atherosclerosis. No enlarged abdominal or
pelvic lymph nodes.

Reproductive: Prostate is unremarkable.

Other: Increased amount of free fluid is noted in the pelvis and
around the liver. No definite hernia is noted.

Musculoskeletal: No acute or significant osseous findings.
IMPRESSION: Interval development of 10.9 x 6.6 cm fluid collection in the left
lower quadrant and pelvis consistent with paradiverticular abscess.
This is consistent with worsening sigmoid diverticulitis. Also noted
is significantly increased small bowel dilatation concerning for
distal small bowel obstruction, most likely related to
diverticulitis. Increased amount of free fluid is noted in the
pelvis as well as in the right upper quadrant around the liver.
These results will be called to the ordering clinician or
representative by the Radiologist Assistant, and communication
documented in the PACS or zVision Dashboard.

Probable hepatic cirrhosis.

Aortic Atherosclerosis ([id number]).

## 2022-11-23 ENCOUNTER — Ambulatory Visit (HOSPITAL_COMMUNITY): Payer: Medicare Other | Attending: Internal Medicine

## 2022-11-23 DIAGNOSIS — I480 Paroxysmal atrial fibrillation: Secondary | ICD-10-CM | POA: Diagnosis not present

## 2022-11-23 DIAGNOSIS — I428 Other cardiomyopathies: Secondary | ICD-10-CM | POA: Diagnosis not present

## 2022-11-23 DIAGNOSIS — I509 Heart failure, unspecified: Secondary | ICD-10-CM | POA: Insufficient documentation

## 2022-11-23 IMAGING — CT CT IMAGE GUIDED DRAINAGE BY PERCUTANEOUS CATHETER
1 of 2 series · 15 of 32 positions shown, 19 images · non-contrast
Comparison: none

CLINICAL DATA: Enlarging left pelvic diverticular abscess

[Series 2: i-spiral 5.0 b40f · axial · 0.96mm/px · z∈[+1096,+1306]mm · 15 of 69 slices shown, 19 images]
[im 6/69  soft-tissue]
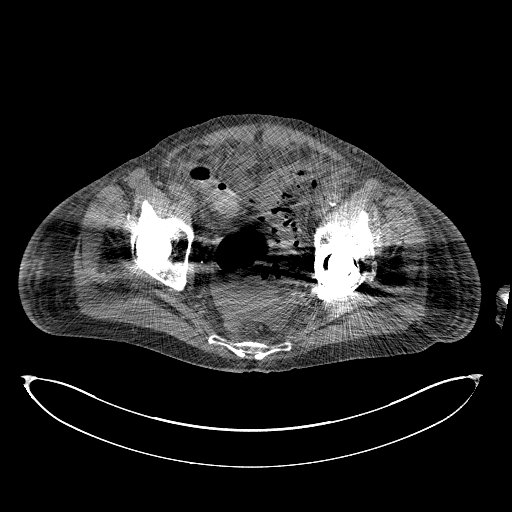
[im 6/69  bone]
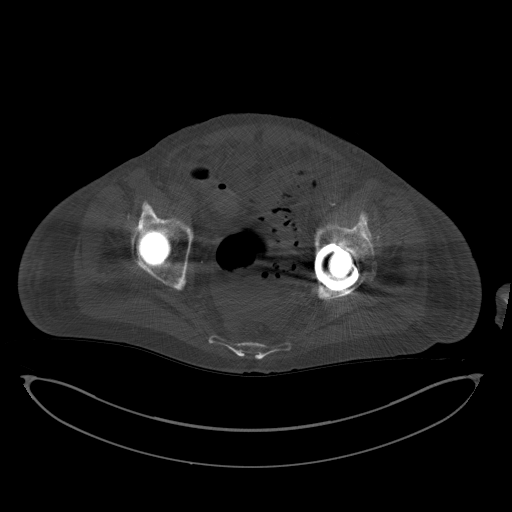
[im 11/69  soft-tissue]
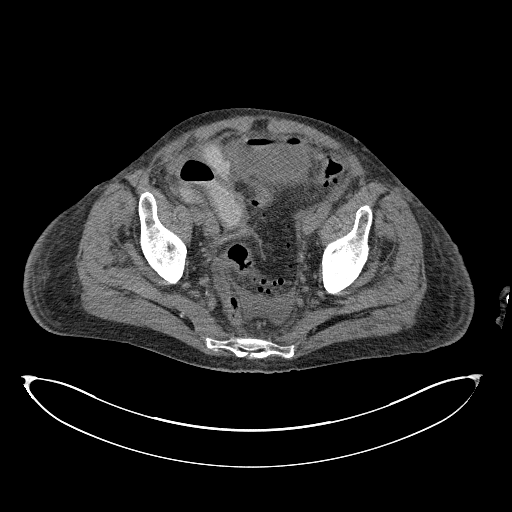
[im 16/69  soft-tissue]
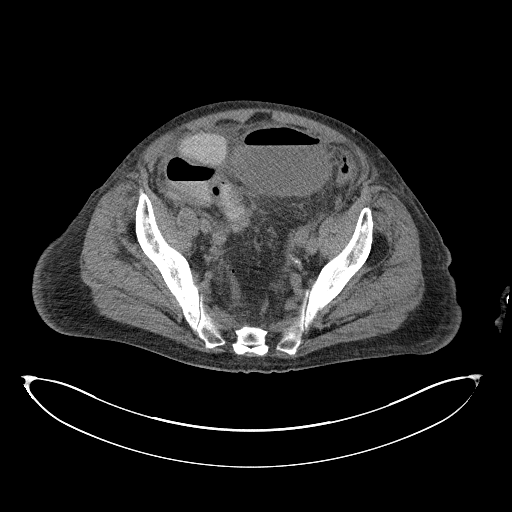
[im 21/69  soft-tissue]
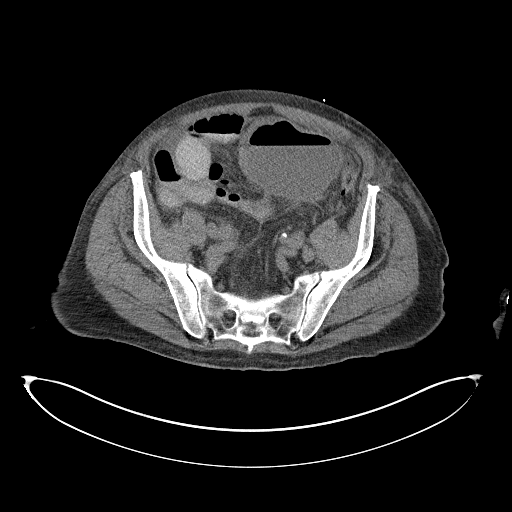
[im 26/69  soft-tissue]
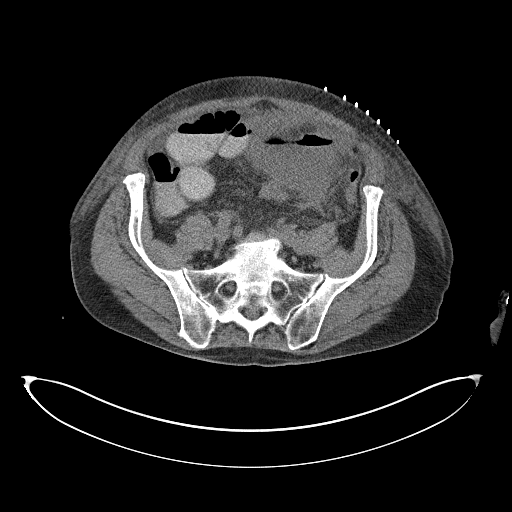
[im 31/69  soft-tissue]
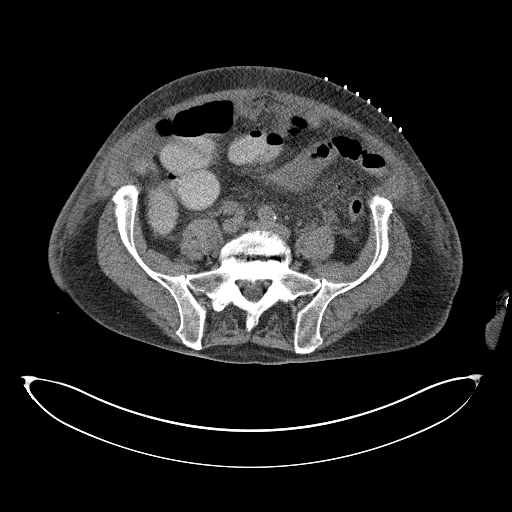
[im 36/69  soft-tissue]
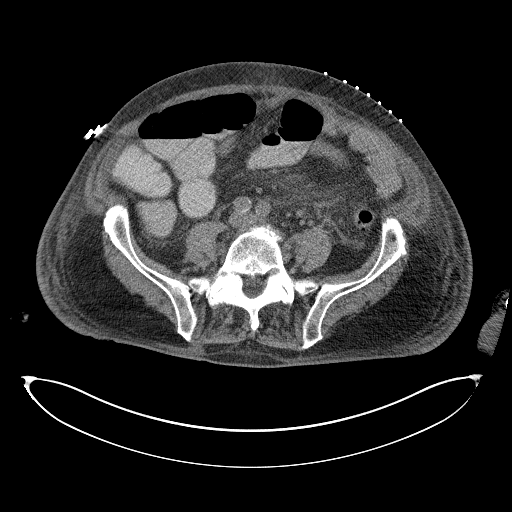
[im 41/69  soft-tissue]
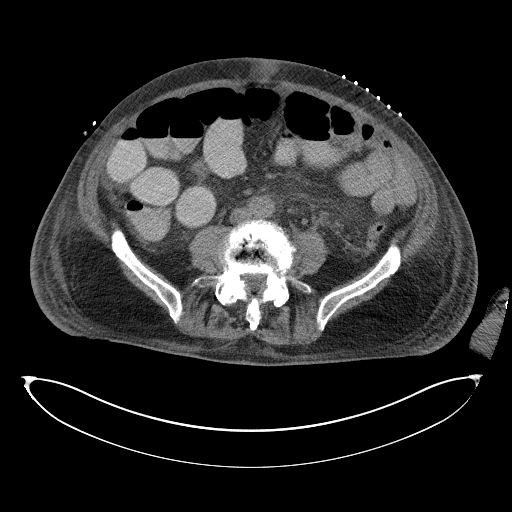
[im 46/69  soft-tissue]
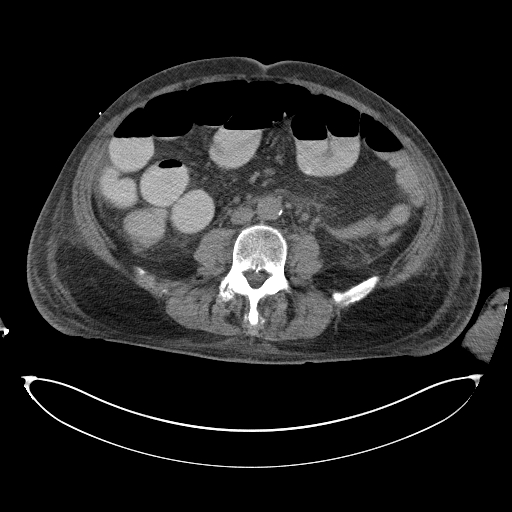
[im 46/69  bone]
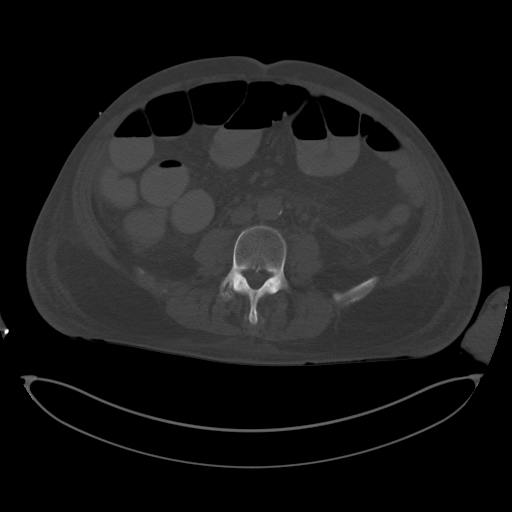
[im 51/69  soft-tissue]
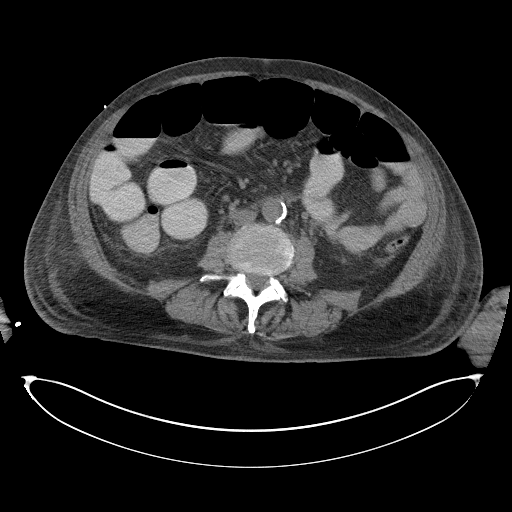
[im 56/69  soft-tissue]
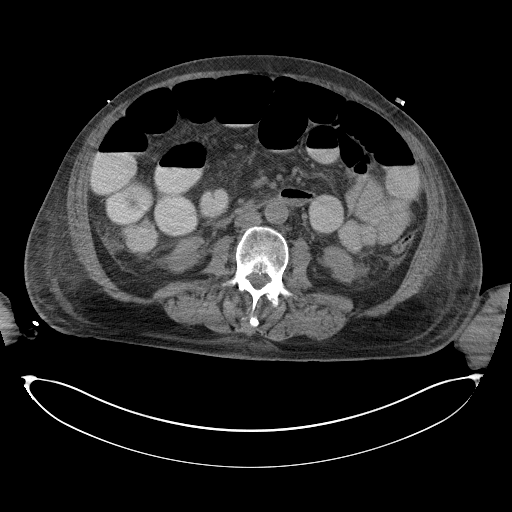
[im 58/69  lung]
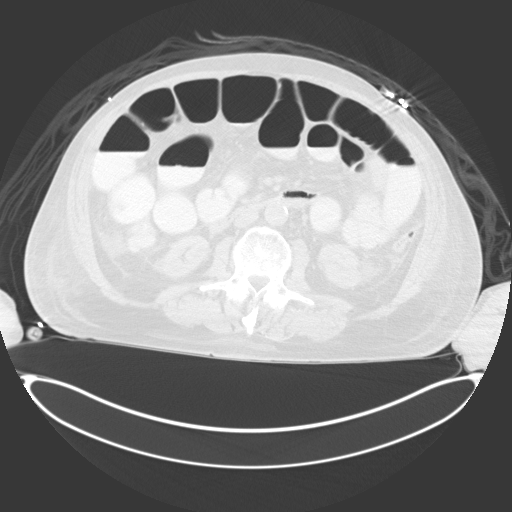
[im 61/69  soft-tissue]
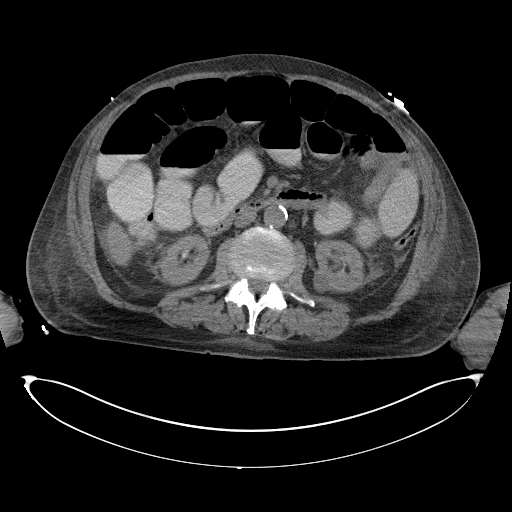
[im 61/69  lung]
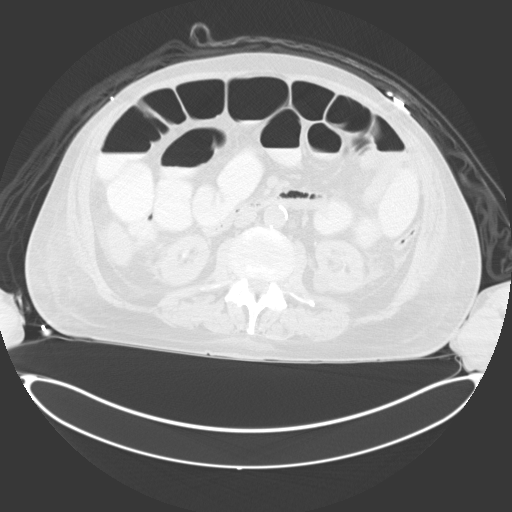
[im 63/69  lung]
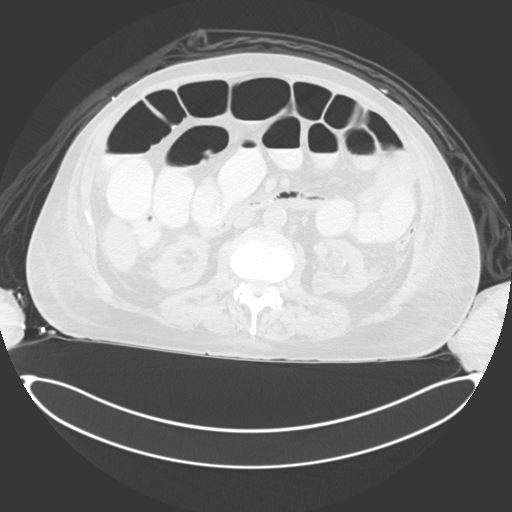
[im 66/69  soft-tissue]
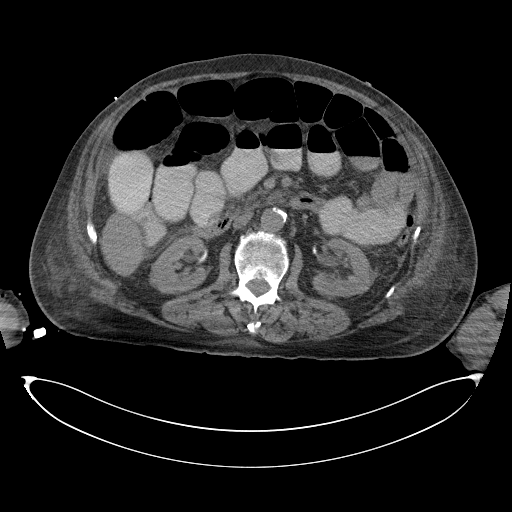
[im 66/69  lung]
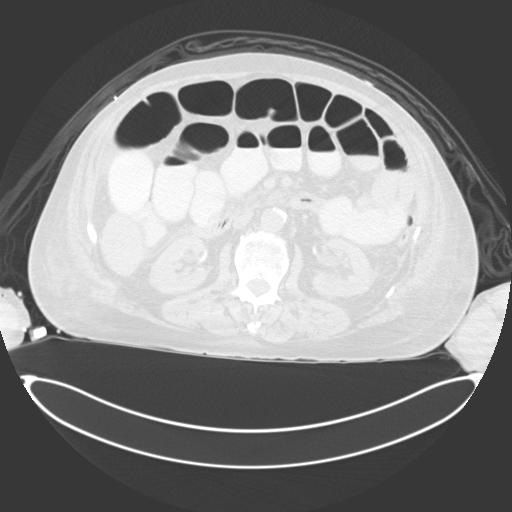

[15 of 32 positions shown; findings below may reference images not displayed]

EXAM:
CT GUIDED DRAINAGE OF PELVIC ABSCESS

ANESTHESIA/SEDATION:
Intravenous Fentanyl 13mcg and Versed 2mg were administered as
conscious sedation during continuous monitoring of the patient's
level of consciousness and physiological / cardiorespiratory status
by the radiology RN, with a total moderate sedation time of 13
minutes.

PROCEDURE:
The procedure, risks, benefits, and alternatives were explained to
the patient. Questions regarding the procedure were encouraged and
answered. The patient understands and consents to the procedure.

Select axial scans through the pelvis were obtained in the left
peritoneal collection was localized. An appropriate skin site was
determined and marked.

The operative field was prepped with chlorhexidinein a sterile
fashion, and a sterile drape was applied covering the operative
field. A sterile gown and sterile gloves were used for the
procedure. Local anesthesia was provided with 1% Lidocaine.

Under CT fluoroscopic guidance, 18 gauge percutaneous entry needle
advanced into the collection. Purulent material returned. Amplatz
guidewire advanced easily, position confirmed on CT. Tract dilated
to facilitate placement 12 French pigtail drain catheter, formed
centrally within the collection. CT confirms good catheter position.
Catheter secured externally 0 Prolene suture and StatLock and placed
to gravity drain bag. 20 mL aspirate sent for Gram stain and
culture. The patient tolerated the procedure well.

COMPLICATIONS:
None immediate
FINDINGS: Loculated left lower quadrant pelvic peritoneal collection was
localized. 12 French pigtail drain catheter placed as above.
Purulent aspirate sample sent for Gram stain and culture.
IMPRESSION: Technically successful CT-guided pelvic abscess drain catheter
placement.

## 2022-11-27 LAB — ECHOCARDIOGRAM COMPLETE
Area-P 1/2: 4.52 cm2
S' Lateral: 3.6 cm

## 2022-11-29 ENCOUNTER — Ambulatory Visit: Payer: Medicare Other | Attending: Internal Medicine | Admitting: Internal Medicine

## 2022-11-29 ENCOUNTER — Encounter: Payer: Self-pay | Admitting: Internal Medicine

## 2022-11-29 VITALS — BP 100/62 | HR 82 | Ht 75.0 in | Wt 210.4 lb

## 2022-11-29 DIAGNOSIS — I428 Other cardiomyopathies: Secondary | ICD-10-CM | POA: Diagnosis not present

## 2022-11-29 DIAGNOSIS — I447 Left bundle-branch block, unspecified: Secondary | ICD-10-CM | POA: Diagnosis not present

## 2022-11-29 DIAGNOSIS — I480 Paroxysmal atrial fibrillation: Secondary | ICD-10-CM

## 2022-11-29 DIAGNOSIS — I509 Heart failure, unspecified: Secondary | ICD-10-CM

## 2022-11-29 NOTE — Progress Notes (Signed)
Patient Care Team: Biagio Borg, MD as PCP - General (Internal Medicine) O'Neal, Cassie Freer, MD as PCP - Cardiology (Internal Medicine) Deboraha Sprang, MD as PCP - Electrophysiology (Cardiology) Deboraha Sprang, MD (Cardiology) Melrose Nakayama, MD as Consulting Physician (Orthopedic Surgery) Biagio Borg, MD as Consulting Physician (Internal Medicine) Marica Otter, [place] as Consulting Physician (Optometry)   HPI  [name] is a 82 y.o. 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   His pacemaker reached ERI 8/13 and he underwent generator replacement with a Biotronik device because of his CLS function  He takes dofetilide for his atrial fibrillation  Developed left bundle branch block.  Catheterization confirmed no interval progressive coronary disease his jailed LAD stent was patent.   2/22 he fell and tore his rotator cuff.  4/22  hospitalization for ruptured diverticulosis with abdominal abscess complicated by acute renal failure atrial fibrillation and congestive heart failure; very weak but gradually improving now back to work.  From a cardiovascular perspective there are number of issues.  #1 he reports worsening shortness of breath over the last 6-12 months notable with climbing hills or stairs.  This is without evidence of volume overload.  No interval atrial fib   The patient denies chest pain, shortness of breath, nocturnal dyspnea, orthopnea or peripheral edema.  There have been no palpitations, lightheadedness or syncope.   Some nasal bleeding   .      DATE TEST EF    8/16 LHC   LADp-stented-patent; LADm-60%;D1-90% jailed LCXp-45%;RCAp-40%^  8/16 Echo  30-35%    3/17 Echo 35-40%    6 /17 +Echo   40-45 %    10/17 Echo  40-45  %    4/18 Echo   45%    9/19 Echo  50-55%    10/20 Echo  50-55%    2/22 Myo 20% No ischemia  3/22  Echo 40-45%    8/23 Echo  50-55%    3/24 Echo  40-45%             Date Cr K Hgb  4/18 1.08 4.7 13.8  9/19 1.15 4.7 12.7  10/20 1.20 4.8 14.3  5/22 1.03 4.4 13.4  3/24 1.15 4.0 13.4     Past Medical History:  Diagnosis Date   Allergic rhinitis    takes Allegra daily   Allergic rhinitis, cause unspecified 06/22/2012   Allergy    Arthritis    Atrial fibrillation ([place])    takes Tikosyn and Eliquis daily   Bilateral popliteal artery aneurysm (HCC)    Chronic kidney disease (CKD), stage III (moderate) (HCC)    Clotting disorder ([place])    Coronary artery disease    LAD stenting 2004 non-DES   Depression    took Zoloft 68yrs ago but nothing now   Diverticulosis    DVT (deep venous thrombosis) (HCC)    left popliteal artery greater than 10 years ago   Dysphagia    occasionally   History of blood clots 2003   left leg prior to fem pop   History of colon polyps    Hyperlipidemia    takes Tricor and Lipitor daily   Insomnia    takes Ambien nightly as needed   Joint pain    Joint swelling    Neuromuscular disorder (HCC)    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   OSA (obstructive sleep apnea)    CPAP   Pacemaker    Peripheral vascular disease ([place])    Pleurisy    early 80's   Prostatitis    Sleep apnea    Urinary urgency     Past Surgical History:  Procedure Laterality Date   ATRIAL FIBRILLATION ABLATION N/A 06/30/2021   Procedure: ATRIAL FIBRILLATION ABLATION;  Surgeon: Thompson Grayer, MD;  Location: [facility];  Service: Cardiovascular;  Laterality: N/A;   ATRIAL FIBRILLATION ABLATION N/A 01/09/2022   Procedure: ATRIAL FIBRILLATION ABLATION;  Surgeon: Thompson Grayer, MD;  Location: [facility];  Service: Cardiovascular;  Laterality: N/A;   CARDIAC CATHETERIZATION  09/11/2002   with 2 stents   CARDIAC CATHETERIZATION N/A 05/06/2015   Procedure: Left Heart Cath and Coronary Angiography;  Surgeon: Belva Crome, MD;  Location: [facility];  Service: Cardiovascular;  Laterality: N/A;   CARDIOVERSION N/A 12/18/2020   Procedure: CARDIOVERSION;  Surgeon: Josue Hector, MD;  Location: [facility];  Service: Cardiovascular;  Laterality: N/A;   COLONOSCOPY     hand and arm surgery Right    as a teenager   HAND SURGERY Left    INGUINAL HERNIA REPAIR Right    IR RADIOLOGIST EVAL & MGMT  01/05/2021   JOINT REPLACEMENT Left 12/09/2013   Left Hip    JOINT REPLACEMENT Right 05/05/2014   Right Hip   left knee surgery     PACEMAKER INSERTION  09/25/1996   due to bradycardia   PERMANENT PACEMAKER GENERATOR CHANGE N/A 06/17/2012   Procedure: PERMANENT PACEMAKER GENERATOR CHANGE;  Surgeon: Deboraha Sprang, MD;  Location: [facility];  Service: Cardiovascular;  Laterality: N/A;   right and left fem-pop bypass     SHOULDER ARTHROSCOPY WITH ROTATOR CUFF REPAIR Right 05/31/2021   Procedure: RIGHT SHOULDER ARTHROSCOPY ACROMIOPLASTY AND DEBRIDEMENT;  Surgeon: Melrose Nakayama, MD;  Location: [facility];  Service: Orthopedics;  Laterality: Right;   TOTAL HIP ARTHROPLASTY Left 12/09/2013   Procedure: TOTAL HIP ARTHROPLASTY ANTERIOR APPROACH;  Surgeon: Hessie Dibble, MD;  Location: [facility];  Service: Orthopedics;  Laterality: Left;  left anterior total hip arthroplasty   TOTAL HIP ARTHROPLASTY Right 05/05/2014   Procedure: TOTAL HIP ARTHROPLASTY ANTERIOR APPROACH;  Surgeon: Hessie Dibble, MD;  Location: [facility];  Service: Orthopedics;  Laterality: Right;   TURBINATE REDUCTION     wisdom extracted       Current Outpatient Medications  Medication Sig Dispense Refill   acetaminophen (TYLENOL) 500 MG tablet Take 500 mg by mouth every 6 (six) hours as needed (pain/headaches).     albuterol (PROVENTIL) (2.5 MG/3ML) 0.083% nebulizer solution Take 3 mLs (2.5 mg total) by nebulization every 6 (six) hours as needed for wheezing or shortness of breath. 75 mL 12   atorvastatin (LIPITOR) 80 MG tablet TAKE 1 TABLET BY MOUTH ONCE  DAILY 90 tablet 2    carvedilol (COREG) 25 MG tablet TAKE 2 TABLETS BY MOUTH TWICE  DAILY 360 tablet 2   Coenzyme Q10 300 MG CAPS Take 300 mg by mouth in the morning.     Cyanocobalamin (VITAMIN B 12) 500 MCG TABS Take 500 mcg by mouth daily. 1/4 tab weekly     ELIQUIS 5 MG TABS tablet TAKE 1 TABLET BY MOUTH TWICE  DAILY 200 tablet 2   fenofibrate 160 MG tablet TAKE 1 TABLET BY MOUTH DAILY 100 tablet 2   fexofenadine (ALLEGRA) 180 MG tablet Take  90 mg by mouth 2 (two) times daily.     Fluocinonide Emulsified Base 0.05 % CREA APPLY TO AFFECTED AREA(S)  TWO TIMES DAILY AS NEEDED 60 g 2   folic acid (FOLVITE) A999333 MCG tablet Take 400 mcg by mouth in the morning.     levOCARNitine (CARNITINE PO) Take 500 mg by mouth in the morning, at noon, and at bedtime.     Lysine 500 MG TABS Take 500 mg by mouth 3 (three) times daily.     Magnesium 250 MG TABS Take 125 mg by mouth 2 (two) times daily. With vitamin d     triamcinolone (NASACORT) 55 MCG/ACT AERO nasal inhaler Place 2 sprays into the nose in the morning.     No current facility-administered medications for this visit.    Allergies  Allergen Reactions   Amiodarone Other (See Comments)    Peripheral neuropathy resulted   Niacin Other (See Comments)    Caused an irregular heartbeat   Ace Inhibitors Cough   Mometasone Furoate Other (See Comments)    (Nasonex) Causes nasal bleeding and nose bleeds    Review of Systems negative except from HPI and PMH  Physical Exam BP 100/62   Pulse 82   Ht 6\' 3"  (1.905 m)   Wt 210 lb 6.4 oz (95.4 kg)   BMI 26.30 kg/m  Well developed and well nourished in no acute distress HENT normal Neck supple with JVP-flat Clear Device pocket well healed; without hematoma or erythema.  There is no tethering  Regular rate and rhythm, no  gallop No  murmur Abd-soft with active BS No Clubbing cyanosis  edema Skin-warm and dry A & Oriented  Grossly normal sensory and motor function  ECG atrial pacing 82 Interval 24/13/41  Device  function is normal. Programming changes  See Paceart for details     Assessment and  Plan  Atrial fibrillation-paroxysmal  Symptomatic ventricular pacing  Pacemaker Biotronik        LBBB   Nonischemic cardiomyopathy recurrent  Coronary artery disease A) prior stenting B) diffuse nonobstructive disease  Congestive heart failure acute/chronic class IIb  No interval atrial fibrillation.  He has note when he stopped his Tikosyn that his nocturia also improved \ I have reviewed his echo reports with Dr. Phillips Hay following the discrepant initial region with EF of 30-35%.  The issue of LVH was also erroneously recorded.  Repeat review of the ejection fraction was 40-45% which was quite consistent with a prior measurement although there were some qualitative decrease to her eye.  He remains on carvedilol.  He again asked the question related to erectile dysfunction and whether he could take nebivolol and instead.  I do have no data about nebivolol and this cohort of resolved left ventricular dysfunction; he brought out a paper from 2003 showing improvement in a group of 12 placebo-controlled patients.  We discussed the possibility of a beta-blocker withdrawal trial that would last about 2 weeks.  We would decrease his carvedilol from 50 twice daily--25 twice daily for 4 days 12.5 twice daily for 4 days and then 6.25 twice daily for 4 days and then try about 2 weeks and see how things went.  At the end of that, it would be important TAVR is to resume beta-blocker therapy  We also discussed the use of an phosphodiesterase inhibitor.

## 2022-11-29 NOTE — Patient Instructions (Signed)
Medication Instructions:  Your physician has recommended you make the following change in your medication:   ** Reduce your Carvedilol 25mg  to 1 tablet by mouth twice daily x 4 days then take 1/2 tablet by mouth twice daily x 4 days then 1/4 tablet by mouth twice daily x 4 days then stop    *If you need a refill on your cardiac medications before your next appointment, please call your pharmacy*   Lab Work: None ordered.  If you have labs (blood work) drawn today and your tests are completely normal, you will receive your results only by: [place] (if you have MyChart) OR A paper copy in the mail If you have any lab test that is abnormal or we need to change your treatment, we will call you to review the results.   Testing/Procedures: None ordered.    Follow-Up: At [facility], you and your health needs are our priority.  As part of our continuing mission to provide you with exceptional heart care, we have created designated Provider Care Teams.  These Care Teams include your primary Cardiologist (physician) and Advanced Practice Providers (APPs -  Physician Assistants and Nurse Practitioners) who all work together to provide you with the care you need, when you need it.  We recommend signing up for the patient portal called "MyChart".  Sign up information is provided on this After Visit Summary.  MyChart is used to connect with patients for Virtual Visits (Telemedicine).  Patients are able to view lab/test results, encounter notes, upcoming appointments, etc.  Non-urgent messages can be sent to your provider as well.   To learn more about what you can do with MyChart, go to [email or web address].    Your next appointment:   6 months with Dr Caryl Comes

## 2022-11-30 ENCOUNTER — Ambulatory Visit (HOSPITAL_COMMUNITY)
Admission: RE | Admit: 2022-11-30 | Discharge: 2022-11-30 | Disposition: A | Discharge status: Home / Self Care | Payer: Medicare Other | Source: Ambulatory Visit | Attending: Vascular Surgery | Admitting: Vascular Surgery

## 2022-11-30 ENCOUNTER — Ambulatory Visit (INDEPENDENT_AMBULATORY_CARE_PROVIDER_SITE_OTHER)
Admission: RE | Admit: 2022-11-30 | Discharge: 2022-11-30 | Disposition: A | Discharge status: Home / Self Care | Payer: Medicare Other | Source: Ambulatory Visit | Attending: Vascular Surgery | Admitting: Vascular Surgery

## 2022-11-30 ENCOUNTER — Ambulatory Visit: Payer: Medicare Other | Admitting: Physician Assistant

## 2022-11-30 VITALS — BP 108/72 | HR 68 | Temp 97.6°F | Resp 20 | Ht 75.0 in | Wt 210.8 lb

## 2022-11-30 DIAGNOSIS — E785 Hyperlipidemia, unspecified: Secondary | ICD-10-CM | POA: Diagnosis not present

## 2022-11-30 DIAGNOSIS — Z8249 Family history of ischemic heart disease and other diseases of the circulatory system: Secondary | ICD-10-CM

## 2022-11-30 DIAGNOSIS — I724 Aneurysm of artery of lower extremity: Secondary | ICD-10-CM | POA: Diagnosis not present

## 2022-11-30 DIAGNOSIS — I739 Peripheral vascular disease, unspecified: Secondary | ICD-10-CM

## 2022-11-30 DIAGNOSIS — I7143 Infrarenal abdominal aortic aneurysm, without rupture: Secondary | ICD-10-CM

## 2022-11-30 DIAGNOSIS — I714 Abdominal aortic aneurysm, without rupture, unspecified: Secondary | ICD-10-CM | POA: Diagnosis not present

## 2022-11-30 LAB — VAS US ABI WITH/WO TBI
Left ABI: 1.41
Right ABI: 1.15

## 2022-11-30 NOTE — Progress Notes (Signed)
Office Note     CC:  follow up Requesting Provider:  Biagio Borg, MD  HPI: [name] is a 82 y.o. ([birth year]) male who presents for routine follow up of PAD and AAA. 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 He also has a small AAA that we have been monitoring since 2002 that has been stable around 3 cm. At his last visit in November of 2022 he was not having any claudication, rest pain or tissue loss. No Abdominal pain or back pain.  Today he returns with non invasive studies. He denies any pain in his legs on ambulation or rest. No tissue loss. He explains that he use to have cold feet all the time but this improved after his bypasses. He still does experience some left hand coldness greater that the right but he says this has been present for approximately 30 years. He still works as a Museum/gallery curator so he says he has been very busy and had to cut down on his days at the gym from 3x/week to 1x but he is hopeful once he catches up on some projects to get back to the gym with his wife more often.  He denies any back or abdominal pain.   He explains that he had two ablations for atrial fibrillation. The first was not successful but in May of 2023 he had his second ablation and has been without any atrial fibrillation since then. He does remain on Eliquis  The pt is on a statin for cholesterol management.  The pt is not on a daily aspirin.   Other AC:  Eliquis for atrial fibrillation The pt is on BB for hypertension.   The pt is not diabetic Tobacco hx:  never  Past Medical History:  Diagnosis Date   Allergic rhinitis    takes Allegra daily   Allergic rhinitis, cause unspecified 06/22/2012   Allergy    Arthritis    Atrial fibrillation ([place])    takes Tikosyn and Eliquis daily   Bilateral popliteal artery aneurysm (HCC)    Chronic kidney disease  (CKD), stage III (moderate) (HCC)    Clotting disorder (HCC)    Coronary artery disease    LAD stenting 2004 non-DES   Depression    took Zoloft 60yrs ago but nothing now   Diverticulosis    DVT (deep venous thrombosis) (HCC)    left popliteal artery greater than 10 years ago   Dysphagia    occasionally   History of blood clots 2003   left leg prior to fem pop   History of colon polyps    Hyperlipidemia    takes Tricor and Lipitor daily   Insomnia    takes Ambien nightly as needed   Joint pain    Joint swelling    Neuromuscular disorder (HCC)    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   OSA (obstructive sleep apnea)    CPAP   Pacemaker    Peripheral vascular disease ([place])    Pleurisy    early 80's   Prostatitis    Sleep apnea    Urinary urgency     Past Surgical History:  Procedure Laterality Date   ATRIAL FIBRILLATION ABLATION N/A 06/30/2021   Procedure: ATRIAL FIBRILLATION ABLATION;  Surgeon: Thompson Grayer, MD;  Location: [facility];  Service: Cardiovascular;  Laterality: N/A;   ATRIAL FIBRILLATION ABLATION N/A 01/09/2022   Procedure: ATRIAL FIBRILLATION ABLATION;  Surgeon: Thompson Grayer, MD;  Location: [facility];  Service: Cardiovascular;  Laterality: N/A;   CARDIAC CATHETERIZATION  09/11/2002   with 2 stents   CARDIAC CATHETERIZATION N/A 05/06/2015   Procedure: Left Heart Cath and Coronary Angiography;  Surgeon: Belva Crome, MD;  Location: [facility];  Service: Cardiovascular;  Laterality: N/A;   CARDIOVERSION N/A 12/18/2020   Procedure: CARDIOVERSION;  Surgeon: Josue Hector, MD;  Location: [facility];  Service: Cardiovascular;  Laterality: N/A;   COLONOSCOPY     hand and arm surgery Right    as a teenager   HAND SURGERY Left    INGUINAL HERNIA REPAIR Right    IR RADIOLOGIST EVAL & MGMT  01/05/2021   JOINT REPLACEMENT Left 12/09/2013   Left Hip    JOINT REPLACEMENT Right 05/05/2014   Right Hip   left knee surgery      PACEMAKER INSERTION  09/25/1996   due to bradycardia   PERMANENT PACEMAKER GENERATOR CHANGE N/A 06/17/2012   Procedure: PERMANENT PACEMAKER GENERATOR CHANGE;  Surgeon: Deboraha Sprang, MD;  Location: [facility];  Service: Cardiovascular;  Laterality: N/A;   right and left fem-pop bypass     SHOULDER ARTHROSCOPY WITH ROTATOR CUFF REPAIR Right 05/31/2021   Procedure: RIGHT SHOULDER ARTHROSCOPY ACROMIOPLASTY AND DEBRIDEMENT;  Surgeon: Melrose Nakayama, MD;  Location: [facility];  Service: Orthopedics;  Laterality: Right;   TOTAL HIP ARTHROPLASTY Left 12/09/2013   Procedure: TOTAL HIP ARTHROPLASTY ANTERIOR APPROACH;  Surgeon: Hessie Dibble, MD;  Location: [facility];  Service: Orthopedics;  Laterality: Left;  left anterior total hip arthroplasty   TOTAL HIP ARTHROPLASTY Right 05/05/2014   Procedure: TOTAL HIP ARTHROPLASTY ANTERIOR APPROACH;  Surgeon: Hessie Dibble, MD;  Location: [place];  Service: Orthopedics;  Laterality: Right;   TURBINATE REDUCTION     wisdom extracted       Social History   Socioeconomic History   Marital status: Married    Spouse name: Not on file   Number of children: Not on file   Years of education: Not on file   Highest education level: Not on file  Occupational History   Not on file  Tobacco Use   Smoking status: Never    Passive exposure: Past   Smokeless tobacco: Never   Tobacco comments:    Never smoke 02/07/22  Vaping Use   Vaping Use: Never used  Substance and Sexual Activity   Alcohol use: No    Alcohol/week: 0.0 standard drinks of alcohol    Comment: nothing since 2000   Drug use: No   Sexual activity: Yes  Other Topics Concern   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    Social Determinants of Health   Financial Resource Strain: Low Risk  (10/31/2021)   Overall Financial Resource Strain (CARDIA)    Difficulty of Paying Living Expenses: Not hard at all  Food Insecurity: No Food Insecurity  (10/31/2021)   Hunger Vital Sign    Worried About Running Out of Food in the Last Year: Never true    Ran Out of Food in the Last Year: Never true  Transportation Needs: No Transportation Needs (10/31/2021)   PRAPARE - Hydrologist (Medical): No    Lack of Transportation (Non-Medical): No  Physical Activity: Insufficiently Active (  10/31/2021)   Exercise Vital Sign    Days of Exercise per Week: 3 days    Minutes of Exercise per Session: 40 min  Stress: No Stress Concern Present (10/31/2021)   [place]    Feeling of Stress : Not at all  Social Connections: Moderately Integrated (10/31/2021)   Social Connection and Isolation Panel [NHANES]    Frequency of Communication with Friends and Family: Three times a week    Frequency of Social Gatherings with Friends and Family: Three times a week    Attends Religious Services: Never    Active Member of Clubs or Organizations: Yes    Attends Archivist Meetings: More than 4 times per year    Marital Status: Married  Human resources officer Violence: Not At Risk (10/31/2021)   Humiliation, Afraid, Rape, and Kick questionnaire    Fear of Current or Ex-Partner: No    Emotionally Abused: No    Physically Abused: No    Sexually Abused: No    Family History  Problem Relation Age of Onset   Diabetes Mother    Heart disease Mother        Before age 30   Heart disease Father        Before age 37   Heart attack Father    Stroke Father    Aneurysm Father        bilat pop art aneurysms   Peripheral vascular disease Father        Popliteal Aneurysm   Diabetes Other        family hx   Sleep apnea Other        family hx   Allergic rhinitis Neg Hx    Angioedema Neg Hx    Asthma Neg Hx    Eczema Neg Hx    Immunodeficiency Neg Hx    Urticaria Neg Hx    Colon cancer Neg Hx    Esophageal cancer Neg Hx    Prostate cancer Neg Hx    Rectal cancer Neg Hx     Stomach cancer Neg Hx     Current Outpatient Medications  Medication Sig Dispense Refill   acetaminophen (TYLENOL) 500 MG tablet Take 500 mg by mouth every 6 (six) hours as needed (pain/headaches).     albuterol (PROVENTIL) (2.5 MG/3ML) 0.083% nebulizer solution Take 3 mLs (2.5 mg total) by nebulization every 6 (six) hours as needed for wheezing or shortness of breath. 75 mL 12   atorvastatin (LIPITOR) 80 MG tablet TAKE 1 TABLET BY MOUTH ONCE  DAILY 90 tablet 2   carvedilol (COREG) 25 MG tablet TAKE 2 TABLETS BY MOUTH TWICE  DAILY 360 tablet 2   Coenzyme Q10 300 MG CAPS Take 300 mg by mouth in the morning.     Cyanocobalamin (VITAMIN B 12) 500 MCG TABS Take 500 mcg by mouth daily. 1/4 tab weekly     ELIQUIS 5 MG TABS tablet TAKE 1 TABLET BY MOUTH TWICE  DAILY 200 tablet 2   fenofibrate 160 MG tablet TAKE 1 TABLET BY MOUTH DAILY 100 tablet 2   fexofenadine (ALLEGRA) 180 MG tablet Take 90 mg by mouth 2 (two) times daily.     Fluocinonide Emulsified Base 0.05 % CREA APPLY TO AFFECTED AREA(S)  TWO TIMES DAILY AS NEEDED 60 g 2   folic acid (FOLVITE) A999333 MCG tablet Take 400 mcg by mouth in the morning.     levOCARNitine (CARNITINE PO) Take 500 mg by  mouth in the morning, at noon, and at bedtime.     Lysine 500 MG TABS Take 500 mg by mouth 3 (three) times daily.     Magnesium 250 MG TABS Take 125 mg by mouth 2 (two) times daily. With vitamin d     triamcinolone (NASACORT) 55 MCG/ACT AERO nasal inhaler Place 2 sprays into the nose in the morning.     No current facility-administered medications for this visit.    Allergies  Allergen Reactions   Amiodarone Other (See Comments)    Peripheral neuropathy resulted   Niacin Other (See Comments)    Caused an irregular heartbeat   Ace Inhibitors Cough   Mometasone Furoate Other (See Comments)    (Nasonex) Causes nasal bleeding and nose bleeds     REVIEW OF SYSTEMS:  [X]  denotes positive finding, [ ]  denotes negative finding Cardiac   Comments:  Chest pain or chest pressure:    Shortness of breath upon exertion:    Short of breath when lying flat:    Irregular heart rhythm:        Vascular    Pain in calf, thigh, or hip brought on by ambulation:    Pain in feet at night that wakes you up from your sleep:     Blood clot in your veins:    Leg swelling:         Pulmonary    Oxygen at home:    Productive cough:     Wheezing:         Neurologic    Sudden weakness in arms or legs:     Sudden numbness in arms or legs:     Sudden onset of difficulty speaking or slurred speech:    Temporary loss of vision in one eye:     Problems with dizziness:         Gastrointestinal    Blood in stool:     Vomited blood:         Genitourinary    Burning when urinating:     Blood in urine:        Psychiatric    Major depression:         Hematologic    Bleeding problems:    Problems with blood clotting too easily:        Skin    Rashes or ulcers:        Constitutional    Fever or chills:      PHYSICAL EXAMINATION:  Vitals:   11/30/22 0932  BP: 108/72  Pulse: 68  Resp: 20  Temp: 97.6 F (36.4 C)  TempSrc: Temporal  SpO2: 98%  Weight: 210 lb 12.8 oz (95.6 kg)  Height: 6\' 3"  (1.905 m)    General:  WDWN in NAD; vital signs documented above Gait: Normal HENT: WNL, normocephalic Pulmonary: normal non-labored breathing , without wheezing Cardiac: regular HR, without  Murmurs without carotid bruit Abdomen: soft, NT, no masses. No palpable AAA Vascular Exam/Pulses:  Right Left  Radial 2+ (normal) 2+ (normal)  Brachial 2+ (normal) 2+ (normal)  Femoral 2+ (normal) 2+ (normal)  Popliteal absent absent  DP 2+ (normal) 2+ (normal)  PT 2+ (normal) 2+ (normal)   Extremities: without ischemic changes, without Gangrene , without cellulitis; without open wounds Musculoskeletal: no muscle wasting or atrophy  Neurologic: A&O X 3;  No focal weakness or paresthesias are detected Psychiatric:  The pt has Normal  affect.   Non-Invasive Vascular Imaging:   Abdominal Aorta Findings:  +-----------+-------+----------+----------+--------+--------+--------------  ----+  Location  AP (cm)Trans (cm)PSV (cm/s)WaveformThrombusComments             +-----------+-------+----------+----------+--------+--------+--------------  ----+  Proximal  3.50   3.86      57                                             +-----------+-------+----------+----------+--------+--------+--------------  ----+  Mid       2.27   2.31      54                                             +-----------+-------+----------+----------+--------+--------+--------------  ----+  Distal    2.35   2.68                                Velocity  image                                                            didn't save          +-----------+-------+----------+----------+--------+--------+--------------  ----+  RT CIA Prox1.5    1.8       73                                             +-----------+-------+----------+----------+--------+--------+--------------  ----+  LT CIA Prox1.4    1.5       72                                             +-----------+-------+----------+----------+--------+--------+--------------  ----+   Summary:  Abdominal Aorta: There is evidence of abnormal dilatation of the proximal abdominal aorta. The largest aortic measurement is 3.9 cm. The largest aortic diameter has increased compared to prior exam. Previous diameter measurement was 3.9 cm obtained on 2013.   +-------+-----------+-----------+------------+------------+  ABI/TBIToday's ABIToday's TBIPrevious ABIPrevious TBI  +-------+-----------+-----------+------------+------------+  Right 1.15       0.62       1.36        0.78          +-------+-----------+-----------+------------+------------+  Left  1.41       0.77       1.35        0.75           +-------+-----------+-----------+------------+------------+   ASSESSMENT/PLAN:: 82 y.o. male here for follow up for PAD and AAA. He is without any rest pain, claudication or tissue loss. He has no back pain or abdominal pain.  - ABI's today are essentially unchanged from prior. His ABIs are normal today with triphasic flow bilaterally - Duplex shows stable AAA with largest diameter of 3.9 cm. This is unchanged from 2013 - Will plan to repeat AAA duplex in 2 years - I will have him follow up in 1 year with repeat  ABI   Karoline Caldwell, PA-C [facility] [phone]  Clinic MD:   [facility]

## 2023-01-29 ENCOUNTER — Other Ambulatory Visit: Payer: Self-pay | Admitting: Internal Medicine

## 2023-02-04 DIAGNOSIS — H9201 Otalgia, right ear: Secondary | ICD-10-CM | POA: Diagnosis not present

## 2023-03-09 ENCOUNTER — Telehealth: Payer: Self-pay | Admitting: Internal Medicine

## 2023-03-09 DIAGNOSIS — I480 Paroxysmal atrial fibrillation: Secondary | ICD-10-CM

## 2023-03-09 DIAGNOSIS — I5042 Chronic combined systolic (congestive) and diastolic (congestive) heart failure: Secondary | ICD-10-CM

## 2023-03-09 NOTE — Telephone Encounter (Signed)
Patient is schedule for 10/29 for a 65m f/u with Dr. Graciela Husbands, he states he normally gets an Echo done prior to his visit.  He would like for an echo to be ordered.

## 2023-03-13 NOTE — Telephone Encounter (Signed)
Echo ok per Dr Graciela Husbands.

## 2023-03-14 ENCOUNTER — Encounter (HOSPITAL_COMMUNITY): Payer: Self-pay

## 2023-04-03 ENCOUNTER — Encounter: Payer: Self-pay | Admitting: Pulmonary Disease

## 2023-04-03 ENCOUNTER — Ambulatory Visit: Payer: Medicare Other | Admitting: Pulmonary Disease

## 2023-04-03 ENCOUNTER — Ambulatory Visit (INDEPENDENT_AMBULATORY_CARE_PROVIDER_SITE_OTHER): Payer: Medicare Other | Admitting: Pulmonary Disease

## 2023-04-03 VITALS — BP 116/64 | HR 73 | Ht 75.0 in | Wt 202.2 lb

## 2023-04-03 DIAGNOSIS — J849 Interstitial pulmonary disease, unspecified: Secondary | ICD-10-CM | POA: Diagnosis not present

## 2023-04-03 MED ORDER — ALBUTEROL SULFATE HFA 108 (90 BASE) MCG/ACT IN AERS: 2.0000 | INHALATION_SPRAY | Freq: Four times a day (QID) | RESPIRATORY_TRACT | 6 refills | Status: AC | PRN

## 2023-04-03 NOTE — Patient Instructions (Addendum)
Your breathing tests are stable  Try albuterol inhaler or nebulizer treatments every 4-6 hours as needed for cough  Follow up in 1 year with CT Chest scan and PFTs

## 2023-04-03 NOTE — Progress Notes (Signed)
Full PFT performed today. °

## 2023-04-03 NOTE — Patient Instructions (Signed)
Full PFT performed today. °

## 2023-04-03 NOTE — Progress Notes (Signed)
Synopsis: Referred in December 2022 for ILD by Hillis Range, MD  Subjective:   PATIENT ID: [name] GENDER: male DOB: 1941-05-08, MRN: [id number]  HPI  Chief Complaint  Patient presents with   Follow-up    F/U after PFT. States his breathing has been stable.    [name] is an 82 year old male, never smoker with atrial fibrillation, CKD III, CAD, OSA, DVT and PVD who returns to pulmonary clinic for pulmonary fibrosis.   He reports increase in cough and mucous congestion since last visit.  His pulmonary function tests are stable over the past year and remain within normal limits.  He is accompanied by his wife today.  OV 11/20/22 His wife as accompanied him on this visit.   He overall feels well since last visit. No issues with dyspnea. He feels he is coughing a bit more with the weather changes and is using albuterol nebulizer treatments which helps him clear his airways.   HRCT Chest 10/05/22 is stable compared to 08/11/21.   OV 04/04/22 He had atrial fibrillation ablation on 01/09/22 and has been feeling better since the procedure. He remains in sinus rhythm.   Inflammatory workup up from 12/29/21 is unremarkable.   We discussed treatment with anti-fibrotic medications, their efficacy and their side effect profiles.  OV 12/28/21 PFTs today show moderate diffusion defect. We discussed the correlation between the high resolution CT Chest scan and the PFT results. He has exertional dyspnea only when really pushing himself. Otherwise no daily limitations in his activities due to dyspnea. He is having a repeat atrial fibrillation ablation on May 1.   OV 09/07/21 Patient had CT cardiac scan on 06/23/2021 fibrotic changes at the bilateral lung bases and it was recommended he have a high-resolution CT chest scan for further evaluation.  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  He was also noted  to have mild central cylindrical bronchiectasis and a 4 mm subpleural posterior left lower lobe nodule.  He denies any shortness of breath or limitations to his daily activities.  He denies any wheezing.  He does report a congested cough over the past 34 years.  The cough does not wake him up at night.  He feels like the cough is coming from deep in his chest and will produce whitish phlegm occasionally.  He reports he developed a cough since an episode of pleurisy in 1988.  He is followed by ENT for perforated septum due to cauterization procedures for epistaxis in the past.  He is using Nasacort nasal spray daily.  He does have postnasal drainage.  He also reports dry nasal/sinus passages.  He has a history of obstructive sleep apnea and using his CPAP machine that is nearly 83 years old.  He does not use humidification.  He does not follow with a sleep medicine physician.  He does report occasional GERD symptoms.  He has reduced his caffeine intake to reduce the symptoms.  He is a never smoker but does report significant secondhand smoke exposure in childhood.  He is on her [place] family homes, a single-family [employer] company.  He denies any significant dust exposures over the years as he has mainly been on the distal side.  He denies any skin rashes or joint aches.  Past Medical History:  Diagnosis Date   Allergic rhinitis    takes Allegra daily   Allergic rhinitis, cause unspecified  06/22/2012   Allergy    Arthritis    Atrial fibrillation (HCC)    takes Tikosyn and Eliquis daily   Bilateral popliteal artery aneurysm (HCC)    Chronic kidney disease (CKD), stage III (moderate) (HCC)    Clotting disorder (HCC)    Coronary artery disease    LAD stenting 2004 non-DES   Depression    took Zoloft 10yrs ago but nothing now   Diverticulosis    DVT (deep venous thrombosis) (HCC)    left popliteal artery greater than 10 years ago   Dysphagia    occasionally   History of blood  clots 2003   left leg prior to fem pop   History of colon polyps    Hyperlipidemia    takes Tricor and Lipitor daily   Insomnia    takes Ambien nightly as needed   Joint pain    Joint swelling    Neuromuscular disorder (HCC)    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   OSA (obstructive sleep apnea)    CPAP   Pacemaker    Peripheral vascular disease (HCC)    Pleurisy    early 80's   Prostatitis    Sleep apnea    Urinary urgency      Family History  Problem Relation Age of Onset   Diabetes Mother    Heart disease Mother        Before age 43   Heart disease Father        Before age 29   Heart attack Father    Stroke Father    Aneurysm Father        bilat pop art aneurysms   Peripheral vascular disease Father        Popliteal Aneurysm   Diabetes Other        family hx   Sleep apnea Other        family hx   Allergic rhinitis Neg Hx    Angioedema Neg Hx    Asthma Neg Hx    Eczema Neg Hx    Immunodeficiency Neg Hx    Urticaria Neg Hx    Colon cancer Neg Hx    Esophageal cancer Neg Hx    Prostate cancer Neg Hx    Rectal cancer Neg Hx    Stomach cancer Neg Hx      Social History   Socioeconomic History   Marital status: Married    Spouse name: Not on file   Number of children: Not on file   Years of education: Not on file   Highest education level: Not on file  Occupational History   Not on file  Tobacco Use   Smoking status: Never    Passive exposure: Past   Smokeless tobacco: Never   Tobacco comments:    Never smoke 02/07/22  Vaping Use   Vaping status: Never Used  Substance and Sexual Activity   Alcohol use: No    Alcohol/week: 0.0 standard drinks of alcohol    Comment: nothing since 2000   Drug use: No   Sexual activity: Yes  Other Topics Concern   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    Social Determinants of Health   Financial Resource Strain: Low Risk  (10/31/2021)    Overall Financial Resource Strain (CARDIA)    Difficulty of Paying Living Expenses: Not hard at all  Food Insecurity: Low Risk  (11/17/2022)  [facility]   Food vital sign    Within the past 12 months, you worried that your food would run out before you got money to buy more: Never true    Within the past 12 months, the food you bought just didn't last and you didn't have money to get more. : Never true  Transportation Needs: No Transportation Needs (11/17/2022)   [facility]   Transportation    In the past 12 months, has lack of reliable transportation kept you from medical appointments, meetings, work or from getting things needed for daily living? : No  Physical Activity: Insufficiently Active (10/31/2021)   Exercise Vital Sign    Days of Exercise per Week: 3 days    Minutes of Exercise per Session: 40 min  Stress: No Stress Concern Present (10/31/2021)   [employer] of Occupational Health - Occupational Stress Questionnaire    Feeling of Stress : Not at all  Social Connections: Moderately Integrated (10/31/2021)   Social Connection and Isolation Panel [NHANES]    Frequency of Communication with Friends and Family: Three times a week    Frequency of Social Gatherings with Friends and Family: Three times a week    Attends Religious Services: Never    Active Member of Clubs or Organizations: Yes    Attends Banker Meetings: More than 4 times per year    Marital Status: Married  Catering manager Violence: Not At Risk (10/31/2021)   Humiliation, Afraid, Rape, and Kick questionnaire    Fear of Current or Ex-Partner: No    Emotionally Abused: No    Physically Abused: No    Sexually Abused: No     Allergies  Allergen Reactions   Amiodarone Other (See Comments)    Peripheral neuropathy resulted   Niacin Other (See Comments)    Caused an irregular heartbeat   Ace Inhibitors Cough   Mometasone Furoate Other  (See Comments)    (Nasonex) Causes nasal bleeding and nose bleeds     Outpatient Medications Prior to Visit  Medication Sig Dispense Refill   acetaminophen (TYLENOL) 500 MG tablet Take 500 mg by mouth every 6 (six) hours as needed (pain/headaches).     albuterol (PROVENTIL) (2.5 MG/3ML) 0.083% nebulizer solution Take 3 mLs (2.5 mg total) by nebulization every 6 (six) hours as needed for wheezing or shortness of breath. 75 mL 12   atorvastatin (LIPITOR) 80 MG tablet TAKE 1 TABLET BY MOUTH ONCE  DAILY 90 tablet 2   Coenzyme Q10 300 MG CAPS Take 300 mg by mouth in the morning.     Cyanocobalamin (VITAMIN B 12) 500 MCG TABS Take 500 mcg by mouth daily. 1/4 tab weekly     ELIQUIS 5 MG TABS tablet TAKE 1 TABLET BY MOUTH TWICE  DAILY 200 tablet 2   fenofibrate 160 MG tablet TAKE 1 TABLET BY MOUTH DAILY 100 tablet 2   fexofenadine (ALLEGRA) 180 MG tablet Take 90 mg by mouth 2 (two) times daily.     Fluocinonide Emulsified Base 0.05 % CREA APPLY TO AFFECTED AREA(S)  TWO TIMES DAILY AS NEEDED 60 g 2   folic acid (FOLVITE) 400 MCG tablet Take 400 mcg by mouth in the morning.     Lysine 500 MG TABS Take 500 mg by mouth 3 (three) times daily.     Magnesium 250 MG TABS Take 125 mg by mouth 2 (two) times daily. With vitamin d     carvedilol (  COREG) 25 MG tablet TAKE 2 TABLETS BY MOUTH TWICE  DAILY 360 tablet 2   levOCARNitine (CARNITINE PO) Take 500 mg by mouth in the morning, at noon, and at bedtime.     triamcinolone (NASACORT) 55 MCG/ACT AERO nasal inhaler Place 2 sprays into the nose in the morning.     No facility-administered medications prior to visit.   Review of Systems  Constitutional:  Negative for chills, fever, malaise/fatigue and weight loss.  HENT:  Negative for congestion, sinus pain and sore throat.   Eyes: Negative.   Respiratory:  Positive for cough and sputum production. Negative for hemoptysis, shortness of breath and wheezing.   Cardiovascular:  Negative for chest pain,  palpitations, orthopnea, claudication and leg swelling.  Gastrointestinal:  Negative for abdominal pain, heartburn, nausea and vomiting.  Genitourinary: Negative.   Musculoskeletal:  Negative for joint pain and myalgias.  Skin:  Negative for rash.  Neurological:  Negative for weakness.  Endo/Heme/Allergies: Negative.   Psychiatric/Behavioral: Negative.      Objective:   Vitals:   04/03/23 1505  BP: 116/64  Pulse: 73  SpO2: 100%  Weight: 202 lb 3.2 oz (91.7 kg)  Height: 6\' 3"  (1.905 m)    Physical Exam Constitutional:      General: He is not in acute distress. HENT:     Head: Normocephalic and atraumatic.  Eyes:     Conjunctiva/sclera: Conjunctivae normal.  Cardiovascular:     Rate and Rhythm: Normal rate and regular rhythm.     Pulses: Normal pulses.     Heart sounds: Normal heart sounds. No murmur heard. Pulmonary:     Effort: Pulmonary effort is normal.     Breath sounds: Rales (faint, bibasilar) present.  Musculoskeletal:     Right lower leg: No edema.     Left lower leg: No edema.  Skin:    General: Skin is warm and dry.  Neurological:     General: No focal deficit present.     Mental Status: He is alert.    CBC    Component Value Date/Time   WBC 6.1 11/10/2022 0703   RBC 3.95 (L) 11/10/2022 0703   HGB 13.4 11/10/2022 0703   HGB 14.1 12/15/2021 1533   HCT 39.3 11/10/2022 0703   HCT 41.6 12/15/2021 1533   PLT 143 (L) 11/10/2022 0703   PLT 129 (L) 12/15/2021 1533   MCV 99.5 11/10/2022 0703   MCV 97 12/15/2021 1533   MCH 33.9 11/10/2022 0703   MCHC 34.1 11/10/2022 0703   RDW 13.6 11/10/2022 0703   RDW 12.4 12/15/2021 1533   LYMPHSABS 1.1 11/10/2022 0703   LYMPHSABS 1.2 12/15/2021 1533   MONOABS 0.6 11/10/2022 0703   EOSABS 0.3 11/10/2022 0703   EOSABS 0.2 12/15/2021 1533   BASOSABS 0.1 11/10/2022 0703   BASOSABS 0.0 12/15/2021 1533      Latest Ref Rng & Units 11/10/2022    7:03 AM 11/10/2022    7:02 AM 05/16/2022    1:50 PM  BMP  Glucose 70 - 99  mg/dL 161  096  045   BUN 8 - 23 mg/dL 15  17  18    Creatinine 0.61 - 1.24 mg/dL 4.09  8.11  9.14   Sodium 135 - 145 mmol/L 142  144  145   Potassium 3.5 - 5.1 mmol/L 4.0  4.1  4.9   Chloride 98 - 111 mmol/L 108  107  109   CO2 22 - 32 mmol/L 27   30  Calcium 8.9 - 10.3 mg/dL 9.9   9.8    Chest imaging: HRCT Chest 10/05/22 1. No significant change in mild bibasilar pulmonary fibrosis featuring somewhat heterogeneously distributed irregular peripheral interstitial opacity, septal thickening, and subpleural bronchiolectasis at the lung bases. 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 Am Rosezetta Schlatter Crit Care Med Vol 198, Iss 5, [id number], May 12 2017. 2. Coronary artery disease. 3. Aortic valve calcifications. Correlate for echocardiographic evidence of aortic valve dysfunction.  HRCT Chest 08/11/21 1. Pulmonary parenchymal pattern of fibrosis may be due to usual interstitial pneumonitis or nonspecific interstitial pneumonitis. Findings are categorized as probable UIP per consensus guidelines: Diagnosis of Idiopathic Pulmonary Fibrosis: An Official ATS/ERS/JRS/ALAT Clinical Practice Guideline. Am Rosezetta Schlatter Crit Care Med Vol 198, Iss 5, [id number], May 12 2017. 2. Small pericardial effusion. 3. Mild central cylindrical bronchiectasis. 4. 4 mm subpleural posterior left lower lobe nodule. No follow-up needed if patient is low-risk. Non-contrast chest CT can be considered in 12 months if patient is high-risk. This recommendation follows the consensus statement: Guidelines for Management of Incidental Pulmonary Nodules Detected on CT Images: From the Fleischner Society 2017; Radiology 2017; 284:228-243. 5. Cirrhosis. 6. Aortic atherosclerosis (ICD10-I70.0). Coronary artery calcification.  PFT:    Latest Ref Rng & Units 04/03/2023    2:49 PM 12/28/2021    3:00  PM  PFT Results  FVC-Pre L  4.44   FVC-Predicted Pre % 96  92   FVC-Post L 4.68  4.40   FVC-Predicted Post % 98  91   Pre FEV1/FVC % % 74  75   Post FEV1/FCV % % 75  74   FEV1-Pre L 3.41  3.32   FEV1-Predicted Pre % 100  96   FEV1-Post L 3.52  3.24   DLCO uncorrected ml/min/mmHg 21.71  15.84   DLCO UNC% % 78  56   DLCO corrected ml/min/mmHg 21.71  16.07   DLCO COR %Predicted % 78  57   DLVA Predicted % 97  73   TLC L 10.19  7.05   TLC % Predicted % 126  87   RV % Predicted % 60  92   12/28/21: Mild diffusion defect.  Labs:  Path:  Echo 11/2020: LV EF 40-45%. Abnormal septal motion, consistent with left bundle branch block. Grade II diastolic dysfunction. RV systolic pressure is normal. RV size is moderately enlarged. LA severely dilated. RA mildly dilated.   Heart Catheterization:  Assessment & Plan:   ILD (interstitial lung disease) (HCC) - Plan: CT CHEST HIGH RESOLUTION, Pulmonary Function Test, albuterol (VENTOLIN HFA) 108 (90 Base) MCG/ACT inhaler  Discussion: [name] is an 82 year old male, never smoker with atrial fibrillation, CKD III, CAD, OSA, DVT and PVD who returns to pulmonary clinic for pulmonary fibrosis.   He has stable disease based on monitoring with HRCT chests over the past year along with stable pulmonary function tests that remain within normal limits.    He remains very functional and has good quality of life.  He has noticed increase in cough and mucus production.  He is to try albuterol inhaler 1 to 2 puffs every 4-6 hours as needed.  We discussed the significant side effect profiles of anti-fibrotic medications. Given his lack of clinical symptoms, we will continue to monitor patient.   Follow up in 1 year with CT chest scan and PFTs  Melody Comas, MD [facility] Office: [phone]  Current Outpatient Medications:    acetaminophen (TYLENOL) 500 MG tablet, Take 500 mg by mouth every 6 (six) hours as needed  (pain/headaches)., Disp: , Rfl:    albuterol (PROVENTIL) (2.5 MG/3ML) 0.083% nebulizer solution, Take 3 mLs (2.5 mg total) by nebulization every 6 (six) hours as needed for wheezing or shortness of breath., Disp: 75 mL, Rfl: 12   albuterol (VENTOLIN HFA) 108 (90 Base) MCG/ACT inhaler, Inhale 2 puffs into the lungs every 6 (six) hours as needed for wheezing or shortness of breath., Disp: 6.7 g, Rfl: 6   atorvastatin (LIPITOR) 80 MG tablet, TAKE 1 TABLET BY MOUTH ONCE  DAILY, Disp: 90 tablet, Rfl: 2   Coenzyme Q10 300 MG CAPS, Take 300 mg by mouth in the morning., Disp: , Rfl:    Cyanocobalamin (VITAMIN B 12) 500 MCG TABS, Take 500 mcg by mouth daily. 1/4 tab weekly, Disp: , Rfl:    ELIQUIS 5 MG TABS tablet, TAKE 1 TABLET BY MOUTH TWICE  DAILY, Disp: 200 tablet, Rfl: 2   fenofibrate 160 MG tablet, TAKE 1 TABLET BY MOUTH DAILY, Disp: 100 tablet, Rfl: 2   fexofenadine (ALLEGRA) 180 MG tablet, Take 90 mg by mouth 2 (two) times daily., Disp: , Rfl:    Fluocinonide Emulsified Base 0.05 % CREA, APPLY TO AFFECTED AREA(S)  TWO TIMES DAILY AS NEEDED, Disp: 60 g, Rfl: 2   folic acid (FOLVITE) 400 MCG tablet, Take 400 mcg by mouth in the morning., Disp: , Rfl:    Lysine 500 MG TABS, Take 500 mg by mouth 3 (three) times daily., Disp: , Rfl:    Magnesium 250 MG TABS, Take 125 mg by mouth 2 (two) times daily. With vitamin d, Disp: , Rfl:

## 2023-04-04 LAB — PULMONARY FUNCTION TEST
DL/VA: 3.69 ml/min/mmHg/L
DLCO cor % pred: 78 %
DLCO cor: 21.71 ml/min/mmHg
DLCO unc % pred: 78 %
DLCO unc: 21.71 ml/min/mmHg
FEF 25-75 Post: 3.17 L/sec
FEF 25-75 Pre: 2.74 L/sec
FEF2575-%Change-Post: 15 %
FEF2575-%Pred-Post: 137 %
FEF2575-%Pred-Pre: 118 %
FEV1-%Change-Post: 3 %
FEV1-%Pred-Post: 103 %
FEV1-%Pred-Pre: 100 %
FEV1-Post: 3.52 L
FEV1-Pre: 3.41 L
FEV1FVC-%Change-Post: 1 %
FEV6-%Change-Post: 0 %
FEV6-%Pred-Post: 101 %
FEV6-%Pred-Pre: 101 %
FEV6-Post: 4.54 L
FEV6-Pre: 4.52 L
FEV6FVC-%Change-Post: -1 %
FEV6FVC-%Pred-Post: 103 %
FEV6FVC-%Pred-Pre: 104 %
FVC-%Change-Post: 1 %
FVC-%Pred-Pre: 96 %
FVC-Post: 4.68 L
Post FEV1/FVC ratio: 75 %
Post FEV6/FVC ratio: 97 %
Pre FEV1/FVC ratio: 74 %
Pre FEV6/FVC Ratio: 98 %
RV % pred: 60 %
RV: 1.8 L
TLC % pred: 126 %
TLC: 10.19 L

## 2023-04-14 ENCOUNTER — Encounter: Payer: Self-pay | Admitting: Pulmonary Disease

## 2023-04-26 ENCOUNTER — Encounter (INDEPENDENT_AMBULATORY_CARE_PROVIDER_SITE_OTHER): Payer: Self-pay

## 2023-05-08 ENCOUNTER — Other Ambulatory Visit: Payer: Self-pay

## 2023-05-08 ENCOUNTER — Encounter: Payer: Self-pay | Admitting: Internal Medicine

## 2023-05-08 ENCOUNTER — Other Ambulatory Visit: Payer: Self-pay | Admitting: Internal Medicine

## 2023-05-08 DIAGNOSIS — I480 Paroxysmal atrial fibrillation: Secondary | ICD-10-CM

## 2023-05-08 MED ORDER — APIXABAN 5 MG PO TABS: 5.0000 mg | ORAL_TABLET | Freq: Two times a day (BID) | ORAL | 2 refills | Status: DC

## 2023-05-08 NOTE — Telephone Encounter (Signed)
Prescription refill request for Eliquis received. Indication:afib Last office visit:3/24 Scr:1.15  3/24 Age: 82 Weight:91.7  kg  Prescription refilled

## 2023-05-19 ENCOUNTER — Other Ambulatory Visit: Payer: Self-pay | Admitting: Internal Medicine

## 2023-05-23 ENCOUNTER — Ambulatory Visit (INDEPENDENT_AMBULATORY_CARE_PROVIDER_SITE_OTHER): Payer: Medicare Other | Admitting: Internal Medicine

## 2023-05-23 ENCOUNTER — Encounter: Payer: Self-pay | Admitting: Internal Medicine

## 2023-05-23 VITALS — BP 110/64 | HR 67 | Temp 98.5°F | Ht 75.0 in | Wt 203.0 lb

## 2023-05-23 DIAGNOSIS — Z0001 Encounter for general adult medical examination with abnormal findings: Secondary | ICD-10-CM | POA: Diagnosis not present

## 2023-05-23 DIAGNOSIS — E559 Vitamin D deficiency, unspecified: Secondary | ICD-10-CM

## 2023-05-23 DIAGNOSIS — R739 Hyperglycemia, unspecified: Secondary | ICD-10-CM | POA: Diagnosis not present

## 2023-05-23 DIAGNOSIS — E538 Deficiency of other specified B group vitamins: Secondary | ICD-10-CM | POA: Diagnosis not present

## 2023-05-23 DIAGNOSIS — N1831 Chronic kidney disease, stage 3a: Secondary | ICD-10-CM | POA: Diagnosis not present

## 2023-05-23 DIAGNOSIS — N529 Male erectile dysfunction, unspecified: Secondary | ICD-10-CM | POA: Diagnosis not present

## 2023-05-23 DIAGNOSIS — E7800 Pure hypercholesterolemia, unspecified: Secondary | ICD-10-CM

## 2023-05-23 DIAGNOSIS — E785 Hyperlipidemia, unspecified: Secondary | ICD-10-CM

## 2023-05-23 LAB — URINALYSIS, ROUTINE W REFLEX MICROSCOPIC
Bilirubin Urine: NEGATIVE
Hgb urine dipstick: NEGATIVE
Ketones, ur: NEGATIVE
Leukocytes,Ua: NEGATIVE
Nitrite: NEGATIVE
RBC / HPF: NONE SEEN (ref 0–?)
Specific Gravity, Urine: >=1.03 — AB (ref 1.000–1.030)
Total Protein, Urine: NEGATIVE
Urine Glucose: NEGATIVE
Urobilinogen, UA: 0.2 (ref 0.0–1.0)
pH: 6 (ref 5.0–8.0)

## 2023-05-23 LAB — VITAMIN B12: Vitamin B-12: 244 pg/mL (ref 211–911)

## 2023-05-23 LAB — CBC WITH DIFFERENTIAL/PLATELET
Basophils Absolute: 0 10*3/uL (ref 0.0–0.1)
Basophils Relative: 0.6 % (ref 0.0–3.0)
Eosinophils Absolute: 0.2 10*3/uL (ref 0.0–0.7)
Eosinophils Relative: 3.9 % (ref 0.0–5.0)
HCT: 39.2 % (ref 39.0–52.0)
Hemoglobin: 13.1 g/dL (ref 13.0–17.0)
Lymphocytes Relative: 20.5 % (ref 12.0–46.0)
Lymphs Abs: 0.9 10*3/uL (ref 0.7–4.0)
MCHC: 33.3 g/dL (ref 30.0–36.0)
MCV: 98.2 fl (ref 78.0–100.0)
Monocytes Absolute: 0.5 10*3/uL (ref 0.1–1.0)
Monocytes Relative: 12.2 % — ABNORMAL HIGH (ref 3.0–12.0)
Neutro Abs: 2.7 10*3/uL (ref 1.4–7.7)
Neutrophils Relative %: 62.8 % (ref 43.0–77.0)
Platelets: 124 10*3/uL — ABNORMAL LOW (ref 150.0–400.0)
RBC: 4 Mil/uL — ABNORMAL LOW (ref 4.22–5.81)
RDW: 14.5 % (ref 11.5–15.5)
WBC: 4.4 10*3/uL (ref 4.0–10.5)

## 2023-05-23 LAB — HEPATIC FUNCTION PANEL
ALT: 26 U/L (ref 0–53)
AST: 32 U/L (ref 0–37)
Albumin: 3.9 g/dL (ref 3.5–5.2)
Alkaline Phosphatase: 46 U/L (ref 39–117)
Bilirubin, Direct: 0.2 mg/dL (ref 0.0–0.3)
Total Bilirubin: 0.6 mg/dL (ref 0.2–1.2)
Total Protein: 6.3 g/dL (ref 6.0–8.3)

## 2023-05-23 LAB — BASIC METABOLIC PANEL
BUN: 20 mg/dL (ref 6–23)
CO2: 29 meq/L (ref 19–32)
Calcium: 9.5 mg/dL (ref 8.4–10.5)
Chloride: 107 meq/L (ref 96–112)
Creatinine, Ser: 1.07 mg/dL (ref 0.40–1.50)
GFR: 64.1 mL/min (ref >60.00)
Glucose, Bld: 129 mg/dL — ABNORMAL HIGH (ref 70–99)
Potassium: 4.1 meq/L (ref 3.5–5.1)
Sodium: 142 meq/L (ref 135–145)

## 2023-05-23 LAB — LIPID PANEL
Cholesterol: 94 mg/dL (ref 0–200)
HDL: 36.2 mg/dL — ABNORMAL LOW (ref >39.00)
LDL Cholesterol: 45 mg/dL (ref 0–99)
NonHDL: 58
Total CHOL/HDL Ratio: 3
Triglycerides: 63 mg/dL (ref 0.0–149.0)
VLDL: 12.6 mg/dL (ref 0.0–40.0)

## 2023-05-23 LAB — VITAMIN D 25 HYDROXY (VIT D DEFICIENCY, FRACTURES): VITD: 43.36 ng/mL (ref 30.00–100.00)

## 2023-05-23 LAB — TSH: TSH: 1.23 u[IU]/mL (ref 0.35–5.50)

## 2023-05-23 LAB — PSA: PSA: 0.41 ng/mL (ref 0.10–4.00)

## 2023-05-23 LAB — HEMOGLOBIN A1C: Hgb A1c MFr Bld: 5.5 % (ref 4.6–6.5)

## 2023-05-23 MED ORDER — TADALAFIL 20 MG PO TABS: 10.0000 mg | ORAL_TABLET | ORAL | 11 refills | Status: DC | PRN

## 2023-05-23 NOTE — Progress Notes (Signed)
Patient ID: [name], male   DOB: 06-10-1941, 82 y.o.   MRN: [id number]         Chief Complaint:: wellness exam and ED, ckd3a, hld, hyperglycemia       HPI:  [name] is a 82 y.o. male here for wellness exam; o/w up to date               Also goes to gym at least once per wk, trying to get more.  Pt denies chest pain, increased sob or doe, wheezing, orthopnea, PND, increased LE swelling, palpitations, dizziness or syncope.   Pt denies polydipsia, polyuria, or new focal neuro s/s.   Pt denies fever, wt loss, night sweats, loss of appetite, or other constitutional symptoms  Has cardiology f/u in 1 mo with echo and PPM check.  No significant afib recently.  Lost several lbs with better diet.  No overt bleeding, bruising on eliquis. Did have 1 fall with up to BR at night in the dark, hit nose on table.  Denies urinary symptoms such as dysuria, frequency, urgency, flank pain, hematuria or n/v, fever, chills, but does have worsening Ed symptoms over last 6 months.   Wt Readings from Last 3 Encounters:  05/23/23 203 lb (92.1 kg)  04/03/23 202 lb 3.2 oz (91.7 kg)  11/30/22 210 lb 12.8 oz (95.6 kg)   BP Readings from Last 3 Encounters:  05/23/23 110/64  04/03/23 116/64  11/30/22 108/72   Immunization History  Administered Date(s) Administered   Covid-19, Mrna,Vaccine(Spikevax)50yrs and older 05/22/2023   Fluad Quad(high Dose 65+) 06/25/2020, 05/10/2021, 06/17/2022, 05/22/2023   H1N1 08/11/2008   Influenza Split 06/21/2012   Influenza Whole 08/20/2008, 05/31/2010, 05/04/2013   Influenza, High Dose Seasonal PF 06/15/2014, 06/29/2018, 06/15/2019   Influenza-Unspecified 05/18/2015   PFIZER(Purple Top)SARS-COV-2 Vaccination 09/21/2019, 10/11/2019, 06/08/2020, 01/05/2021   Pfizer Covid-19 Vaccine Bivalent Booster 49yrs & up 09/02/2021   Pneumococcal Conjugate-13 04/07/2015   Pneumococcal Polysaccharide-23 09/11/2005   RSV,unspecified 08/17/2022   Td 02/02/2009   Tdap 06/25/2020   Zoster  Recombinant(Shingrix) 02/02/2017, 06/01/2017   Zoster, Live 03/05/2007  There are no preventive care reminders to display for this patient.    Past Medical History:  Diagnosis Date   Allergic rhinitis    takes Allegra daily   Allergic rhinitis, cause unspecified 06/22/2012   Allergy    Arthritis    Atrial fibrillation (HCC)    takes Tikosyn and Eliquis daily   Bilateral popliteal artery aneurysm (HCC)    Chronic kidney disease (CKD), stage III (moderate) (HCC)    Clotting disorder (HCC)    Coronary artery disease    LAD stenting 2004 non-DES   Depression    took Zoloft 72yrs ago but nothing now   Diverticulosis    DVT (deep venous thrombosis) (HCC)    left popliteal artery greater than 10 years ago   Dysphagia    occasionally   History of blood clots 2003   left leg prior to fem pop   History of colon polyps    Hyperlipidemia    takes Tricor and Lipitor daily   Insomnia    takes Ambien nightly as needed   Joint pain    Joint swelling    Neuromuscular disorder (HCC)    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   OSA (obstructive sleep apnea)    CPAP   Pacemaker    Peripheral vascular disease (HCC)    Pleurisy    early 80's  Prostatitis    Sleep apnea    Urinary urgency    Past Surgical History:  Procedure Laterality Date   ATRIAL FIBRILLATION ABLATION N/A 06/30/2021   Procedure: ATRIAL FIBRILLATION ABLATION;  Surgeon: Hillis Range, MD;  Location: [facility] INVASIVE CV LAB;  Service: Cardiovascular;  Laterality: N/A;   ATRIAL FIBRILLATION ABLATION N/A 01/09/2022   Procedure: ATRIAL FIBRILLATION ABLATION;  Surgeon: Hillis Range, MD;  Location: [facility] INVASIVE CV LAB;  Service: Cardiovascular;  Laterality: N/A;   CARDIAC CATHETERIZATION  09/11/2002   with 2 stents   CARDIAC CATHETERIZATION N/A 05/06/2015   Procedure: Left Heart Cath and Coronary Angiography;  Surgeon: Lyn Records, MD;  Location: [facility] INVASIVE CV LAB;  Service: Cardiovascular;  Laterality: N/A;    CARDIOVERSION N/A 12/18/2020   Procedure: CARDIOVERSION;  Surgeon: Wendall Stade, MD;  Location: [facility] OR;  Service: Cardiovascular;  Laterality: N/A;   COLONOSCOPY     hand and arm surgery Right    as a teenager   HAND SURGERY Left    INGUINAL HERNIA REPAIR Right    IR RADIOLOGIST EVAL & MGMT  01/05/2021   JOINT REPLACEMENT Left 12/09/2013   Left Hip    JOINT REPLACEMENT Right 05/05/2014   Right Hip   left knee surgery     PACEMAKER INSERTION  09/25/1996   due to bradycardia   PERMANENT PACEMAKER GENERATOR CHANGE N/A 06/17/2012   Procedure: PERMANENT PACEMAKER GENERATOR CHANGE;  Surgeon: Duke Salvia, MD;  Location: [facility] CATH LAB;  Service: Cardiovascular;  Laterality: N/A;   right and left fem-pop bypass     SHOULDER ARTHROSCOPY WITH ROTATOR CUFF REPAIR Right 05/31/2021   Procedure: RIGHT SHOULDER ARTHROSCOPY ACROMIOPLASTY AND DEBRIDEMENT;  Surgeon: Marcene Corning, MD;  Location: [facility] ORS;  Service: Orthopedics;  Laterality: Right;   TOTAL HIP ARTHROPLASTY Left 12/09/2013   Procedure: TOTAL HIP ARTHROPLASTY ANTERIOR APPROACH;  Surgeon: Velna Ochs, MD;  Location: [facility] OR;  Service: Orthopedics;  Laterality: Left;  left anterior total hip arthroplasty   TOTAL HIP ARTHROPLASTY Right 05/05/2014   Procedure: TOTAL HIP ARTHROPLASTY ANTERIOR APPROACH;  Surgeon: Velna Ochs, MD;  Location: [facility] OR;  Service: Orthopedics;  Laterality: Right;   TURBINATE REDUCTION     wisdom extracted       reports that he has never smoked. He has been exposed to tobacco smoke. He has never used smokeless tobacco. He reports that he does not drink alcohol and does not use drugs. family history includes Aneurysm in his father; Diabetes in his mother and another family member; Heart attack in his father; Heart disease in his father and mother; Peripheral vascular disease in his father; Sleep apnea in an other family member; Stroke in his father. Allergies  Allergen Reactions   Amiodarone Other (See Comments)     Peripheral neuropathy resulted   Niacin Other (See Comments)    Caused an irregular heartbeat   Ace Inhibitors Cough   Mometasone Furoate Other (See Comments)    (Nasonex) Causes nasal bleeding and nose bleeds   Current Outpatient Medications on File Prior to Visit  Medication Sig Dispense Refill   acetaminophen (TYLENOL) 500 MG tablet Take 500 mg by mouth every 6 (six) hours as needed (pain/headaches).     albuterol (PROVENTIL) (2.5 MG/3ML) 0.083% nebulizer solution Take 3 mLs (2.5 mg total) by nebulization every 6 (six) hours as needed for wheezing or shortness of breath. 75 mL 12   albuterol (VENTOLIN HFA) 108 (90 Base) MCG/ACT inhaler Inhale 2 puffs into the  lungs every 6 (six) hours as needed for wheezing or shortness of breath. 6.7 g 6   apixaban (ELIQUIS) 5 MG TABS tablet Take 1 tablet (5 mg total) by mouth 2 (two) times daily. 200 tablet 2   atorvastatin (LIPITOR) 80 MG tablet TAKE 1 TABLET BY MOUTH ONCE  DAILY 90 tablet 2   Coenzyme Q10 300 MG CAPS Take 300 mg by mouth in the morning.     Cyanocobalamin (VITAMIN B 12) 500 MCG TABS Take 500 mcg by mouth daily. 1/4 tab weekly     fenofibrate 160 MG tablet TAKE 1 TABLET BY MOUTH DAILY 100 tablet 2   fexofenadine (ALLEGRA) 180 MG tablet Take 90 mg by mouth 2 (two) times daily.     Fluocinonide Emulsified Base 0.05 % CREA APPLY TO AFFECTED AREA(S)  TWO TIMES DAILY AS NEEDED 60 g 2   folic acid (FOLVITE) 400 MCG tablet Take 400 mcg by mouth in the morning.     Lysine 500 MG TABS Take 500 mg by mouth 3 (three) times daily.     Magnesium 250 MG TABS Take 125 mg by mouth 2 (two) times daily. With vitamin d     No current facility-administered medications on file prior to visit.        ROS:  All others reviewed and negative.  Objective        PE:  BP 110/64 (BP Location: Right Arm, Patient Position: Sitting, Cuff Size: Normal)   Pulse 67   Temp 98.5 F (36.9 C) (Oral)   Ht 6\' 3"  (1.905 m)   Wt 203 lb (92.1 kg)   SpO2 99%   BMI  25.37 kg/m                 Constitutional: Pt appears in NAD               HENT: Head: NCAT.                Right Ear: External ear normal.                 Left Ear: External ear normal.                Eyes: . Pupils are equal, round, and reactive to light. Conjunctivae and EOM are normal               Nose: without d/c or deformity               Neck: Neck supple. Gross normal ROM               Cardiovascular: Normal rate and regular rhythm.                 Pulmonary/Chest: Effort normal and breath sounds without rales or wheezing.                Abd:  Soft, NT, ND, + BS, no organomegaly               Neurological: Pt is alert. At baseline orientation, motor grossly intact               Skin: Skin is warm. No rashes, no other new lesions, LE edema - none               Psychiatric: Pt behavior is normal without agitation   Micro: none  Cardiac tracings I have personally interpreted today:  none  Pertinent Radiological findings (summarize): none  Lab Results  Component Value Date   WBC 4.4 05/23/2023   HGB 13.1 05/23/2023   HCT 39.2 05/23/2023   PLT 124.0 (L) 05/23/2023   GLUCOSE 129 (H) 05/23/2023   CHOL 94 05/23/2023   TRIG 63.0 05/23/2023   HDL 36.20 (L) 05/23/2023   LDLCALC 45 05/23/2023   ALT 26 05/23/2023   AST 32 05/23/2023   NA 142 05/23/2023   K 4.1 05/23/2023   CL 107 05/23/2023   CREATININE 1.07 05/23/2023   BUN 20 05/23/2023   CO2 29 05/23/2023   TSH 1.23 05/23/2023   PSA 0.41 05/23/2023   INR 1.4 (H) 11/10/2022   HGBA1C 5.5 05/23/2023   Assessment/Plan:  [name] is a 82 y.o. White or Caucasian [1] male with  has a past medical history of Allergic rhinitis, Allergic rhinitis, cause unspecified (06/22/2012), Allergy, Arthritis, Atrial fibrillation (HCC), Bilateral popliteal artery aneurysm (HCC), Chronic kidney disease (CKD), stage III (moderate) (HCC), Clotting disorder (HCC), Coronary artery disease, Depression, Diverticulosis, DVT (deep venous  thrombosis) (HCC), Dysphagia, History of blood clots (2003), History of colon polyps, Hyperlipidemia, Insomnia, Joint pain, Joint swelling, Neuromuscular disorder (HCC), Neuropathy, OSA (obstructive sleep apnea), Pacemaker, Peripheral vascular disease (HCC), Pleurisy, Prostatitis, Sleep apnea, and Urinary urgency.  Encounter for well adult exam with abnormal findings Age and sex appropriate education and counseling updated with regular exercise and diet Referrals for preventative services - none needed Immunizations addressed - none needed Smoking counseling  - none needed Evidence for depression or other mood disorder - none significant Most recent labs reviewed. I have personally reviewed and have noted: 1) the patient's medical and social history 2) The patient's current medications and supplements 3) The patient's height, weight, and BMI have been recorded in the chart   CKD (chronic kidney disease) stage 3, GFR 30-59 ml/min (HCC) Lab Results  Component Value Date   CREATININE 1.07 05/23/2023   Stable overall, cont to avoid nephrotoxins   Erectile dysfunction With recent worsening, ok for ciallis prn ,  to f/u any worsening symptoms or concerns   HLD (hyperlipidemia) Lab Results  Component Value Date   LDLCALC 45 05/23/2023   Stable, pt to continue current statin lipitor 80 every day, fenofibrate 160 qd   Hyperglycemia Lab Results  Component Value Date   HGBA1C 5.5 05/23/2023   Stable, pt to continue current medical treatment  - diet, wt control   B12 deficiency Lab Results  Component Value Date   VITAMINB12 244 05/23/2023   Low, to start oral replacement - b12 1000 mcg qd  Followup: Return in about 1 year (around 05/22/2024).  Oliver Barre, MD 05/26/2023 6:00 PM [facility]

## 2023-05-23 NOTE — Patient Instructions (Signed)
Please take all new medication as prescribed - the cialis as needed  Please continue all other medications as before, and refills have been done if requested.  Please have the pharmacy call with any other refills you may need.  Please continue your efforts at being more active, low cholesterol diet, and weight control.  You are otherwise up to date with prevention measures today.  Please keep your appointments with your specialists as you may have planned  Please go to the LAB at the blood drawing area for the tests to be done  You will be contacted by phone if any changes need to be made immediately.  Otherwise, you will receive a letter about your results with an explanation, but please check with MyChart first.  Please make an Appointment to return for your 1 year visit, or sooner if needed

## 2023-05-23 NOTE — Progress Notes (Signed)
The test results show that your current treatment is OK, as the tests are stable.  Please continue the same plan.  There is no other need for change of treatment or further evaluation based on these results, at this time.  thanks 

## 2023-05-26 ENCOUNTER — Encounter: Payer: Self-pay | Admitting: Internal Medicine

## 2023-05-26 DIAGNOSIS — E538 Deficiency of other specified B group vitamins: Secondary | ICD-10-CM | POA: Insufficient documentation

## 2023-05-26 NOTE — Assessment & Plan Note (Signed)

## 2023-05-26 NOTE — Assessment & Plan Note (Signed)
With recent worsening, ok for ciallis prn ,  to f/u any worsening symptoms or concerns

## 2023-05-26 NOTE — Assessment & Plan Note (Signed)
Lab Results  Component Value Date   LDLCALC 45 05/23/2023   Stable, pt to continue current statin lipitor 80 every day, fenofibrate 160 qd

## 2023-05-26 NOTE — Assessment & Plan Note (Signed)
Lab Results  Component Value Date   CREATININE 1.07 05/23/2023   Stable overall, cont to avoid nephrotoxins

## 2023-05-26 NOTE — Assessment & Plan Note (Signed)
Lab Results  Component Value Date   HGBA1C 5.5 05/23/2023   Stable, pt to continue current medical treatment  - diet, wt control

## 2023-05-26 NOTE — Assessment & Plan Note (Signed)
Lab Results  Component Value Date   VITAMINB12 244 05/23/2023   Low, to start oral replacement - b12 1000 mcg qd

## 2023-05-28 ENCOUNTER — Telehealth: Payer: Self-pay

## 2023-05-28 MED ORDER — TADALAFIL 20 MG PO TABS: 20.0000 mg | ORAL_TABLET | Freq: Every day | ORAL | 11 refills | Status: DC | PRN

## 2023-05-28 NOTE — Telephone Encounter (Signed)
Prescription placed up front for Pick up.

## 2023-05-28 NOTE — Addendum Note (Signed)
Addended by: [name] on: 05/28/2023 12:17 PM   Modules accepted: Orders

## 2023-05-28 NOTE — Telephone Encounter (Signed)
Paper RX has been picked up by [facility]

## 2023-07-01 ENCOUNTER — Other Ambulatory Visit: Payer: Self-pay | Admitting: Internal Medicine

## 2023-07-03 ENCOUNTER — Ambulatory Visit (HOSPITAL_COMMUNITY): Payer: Medicare Other | Attending: Internal Medicine

## 2023-07-03 DIAGNOSIS — I5042 Chronic combined systolic (congestive) and diastolic (congestive) heart failure: Secondary | ICD-10-CM

## 2023-07-03 DIAGNOSIS — I480 Paroxysmal atrial fibrillation: Secondary | ICD-10-CM

## 2023-07-03 LAB — ECHOCARDIOGRAM COMPLETE
AR max vel: 1.56 cm2
AV Area VTI: 1.52 cm2
AV Area mean vel: 1.69 cm2
AV Mean grad: 9.7 mm[Hg]
AV Peak grad: 19.2 mm[Hg]
Ao pk vel: 2.19 m/s
S' Lateral: 3.7 cm

## 2023-07-10 ENCOUNTER — Encounter: Payer: Self-pay | Admitting: Internal Medicine

## 2023-07-10 ENCOUNTER — Ambulatory Visit: Payer: Medicare Other | Attending: Internal Medicine | Admitting: Internal Medicine

## 2023-07-10 VITALS — BP 106/58 | HR 69 | Ht 75.0 in | Wt 200.6 lb

## 2023-07-10 DIAGNOSIS — Z950 Presence of cardiac pacemaker: Secondary | ICD-10-CM

## 2023-07-10 DIAGNOSIS — I495 Sick sinus syndrome: Secondary | ICD-10-CM

## 2023-07-10 DIAGNOSIS — I447 Left bundle-branch block, unspecified: Secondary | ICD-10-CM | POA: Diagnosis not present

## 2023-07-10 DIAGNOSIS — I480 Paroxysmal atrial fibrillation: Secondary | ICD-10-CM | POA: Diagnosis not present

## 2023-07-10 DIAGNOSIS — I428 Other cardiomyopathies: Secondary | ICD-10-CM | POA: Diagnosis not present

## 2023-07-10 MED ORDER — NEBIVOLOL HCL 2.5 MG PO TABS: 2.5000 mg | ORAL_TABLET | Freq: Every day | ORAL | 1 refills | Status: DC

## 2023-07-10 NOTE — Patient Instructions (Signed)
Medication Instructions:   Your physician has recommended you make the following change in your medication:   ** Begin Nebivolol 2.5mg  - 1 tablet by mouth daily   *If you need a refill on your cardiac medications before your next appointment, please call your pharmacy*   Lab Work: None ordered.  If you have labs (blood work) drawn today and your tests are completely normal, you will receive your results only by: MyChart Message (if you have MyChart) OR A paper copy in the mail If you have any lab test that is abnormal or we need to change your treatment, we will call you to review the results.   Testing/Procedures: None ordered.    Follow-Up: At [facility], you and your health needs are our priority.  As part of our continuing mission to provide you with exceptional heart care, we have created designated Provider Care Teams.  These Care Teams include your primary Cardiologist (physician) and Advanced Practice Providers (APPs -  Physician Assistants and Nurse Practitioners) who all work together to provide you with the care you need, when you need it.  We recommend signing up for the patient portal called "MyChart".  Sign up information is provided on this After Visit Summary.  MyChart is used to connect with patients for Virtual Visits (Telemedicine).  Patients are able to view lab/test results, encounter notes, upcoming appointments, etc.  Non-urgent messages can be sent to your provider as well.   To learn more about what you can do with MyChart, go to [email or web address].    Your next appointment:   6 months with Dr Graciela Husbands

## 2023-07-10 NOTE — Progress Notes (Signed)
Patient Care Team: [name], MD as PCP - General (Internal Medicine) O'Neal, Ronnald Ramp, MD as PCP - Cardiology (Internal Medicine) Duke Salvia, MD as PCP - Electrophysiology (Cardiology) Duke Salvia, MD (Cardiology) Marcene Corning, MD as Consulting Physician (Orthopedic Surgery) [name], MD as Consulting Physician (Internal Medicine) Blima Ledger, OD as Consulting Physician (Optometry)   HPI  [name] is a 82 y.o. 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   His pacemaker reached ERI 8/13 and he underwent generator replacement with a Biotronik device because of his CLS function  He takes dofetilide for his atrial fibrillation  Developed left bundle branch block.  Catheterization confirmed no interval progressive coronary disease his jailed LAD stent was patent.      From a cardiovascular perspective there are number of issues.  #1 he reports worsening shortness of breath over the last 6-12 months notable with climbing hills or stairs.  This is without evidence of volume overload.  The patient denies chest pain, shortness of breath, nocturnal dyspnea, orthopnea or peripheral edema.  There have been no palpitations, lightheadedness or syncope.  Not exercising because he is too busy  Erectile dysfunction has been a big issue, we have discussed multiple different beta-blocker regimes in the past and have undertaken withdrawal trials of different medications the discontinuation of the carvedilol was associate with a slight improvement.  He reminds me that this goes back to the initiation of amiodarone in the late 1990s.  Marland Kitchen      DATE TEST EF    8/16 LHC   LADp-stented-patent; LADm-60%;D1-90% jailed LCXp-45%;RCAp-40%^  8/16 Echo  30-35%    3/17 Echo 35-40%    6 /17 +Echo   40-45 %    10/17 Echo  40-45  %    4/18 Echo   45%     9/19 Echo  50-55%    10/20 Echo  50-55%    2/22 Myo 20% No ischemia  3/22 Echo 40-45%    8/23 Echo  50-55%    3/24 Echo  40-45%   10/24 Echo  40-45% AoMean grad 19       Date Cr K Hgb  4/18 1.08 4.7 13.8  9/19 1.15 4.7 12.7  10/20 1.20 4.8 14.3  5/22 1.03 4.4 13.4  3/24 1.15 4.0 13.4  9/24 1.07 4.0 13.1     Past Medical History:  Diagnosis Date   Allergic rhinitis    takes Allegra daily   Allergic rhinitis, cause unspecified 06/22/2012   Allergy    Arthritis    Atrial fibrillation (HCC)    takes Tikosyn and Eliquis daily   Bilateral popliteal artery aneurysm (HCC)    Chronic kidney disease (CKD), stage III (moderate) (HCC)    Clotting disorder (HCC)    Coronary artery disease    LAD stenting 2004 non-DES   Depression    took Zoloft 45yrs ago but nothing now   Diverticulosis    DVT (deep venous thrombosis) (HCC)    left popliteal artery greater than 10 years ago   Dysphagia    occasionally   History of blood clots 2003   left leg prior to fem pop   History of colon polyps    Hyperlipidemia    takes Tricor and Lipitor daily   Insomnia    takes Ambien nightly as needed   Joint pain  Joint swelling    Neuromuscular disorder (HCC)    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   OSA (obstructive sleep apnea)    CPAP   Pacemaker    Peripheral vascular disease (HCC)    Pleurisy    early 80's   Prostatitis    Sleep apnea    Urinary urgency     Past Surgical History:  Procedure Laterality Date   ATRIAL FIBRILLATION ABLATION N/A 06/30/2021   Procedure: ATRIAL FIBRILLATION ABLATION;  Surgeon: Hillis Range, MD;  Location: [facility] INVASIVE CV LAB;  Service: Cardiovascular;  Laterality: N/A;   ATRIAL FIBRILLATION ABLATION N/A 01/09/2022   Procedure: ATRIAL FIBRILLATION ABLATION;  Surgeon: Hillis Range, MD;  Location: [facility] INVASIVE CV LAB;  Service: Cardiovascular;  Laterality: N/A;   CARDIAC CATHETERIZATION  09/11/2002   with 2 stents   CARDIAC  CATHETERIZATION N/A 05/06/2015   Procedure: Left Heart Cath and Coronary Angiography;  Surgeon: Lyn Records, MD;  Location: [facility] INVASIVE CV LAB;  Service: Cardiovascular;  Laterality: N/A;   CARDIOVERSION N/A 12/18/2020   Procedure: CARDIOVERSION;  Surgeon: Wendall Stade, MD;  Location: [facility] OR;  Service: Cardiovascular;  Laterality: N/A;   COLONOSCOPY     hand and arm surgery Right    as a teenager   HAND SURGERY Left    INGUINAL HERNIA REPAIR Right    IR RADIOLOGIST EVAL & MGMT  01/05/2021   JOINT REPLACEMENT Left 12/09/2013   Left Hip    JOINT REPLACEMENT Right 05/05/2014   Right Hip   left knee surgery     PACEMAKER INSERTION  09/25/1996   due to bradycardia   PERMANENT PACEMAKER GENERATOR CHANGE N/A 06/17/2012   Procedure: PERMANENT PACEMAKER GENERATOR CHANGE;  Surgeon: Duke Salvia, MD;  Location: [facility] CATH LAB;  Service: Cardiovascular;  Laterality: N/A;   right and left fem-pop bypass     SHOULDER ARTHROSCOPY WITH ROTATOR CUFF REPAIR Right 05/31/2021   Procedure: RIGHT SHOULDER ARTHROSCOPY ACROMIOPLASTY AND DEBRIDEMENT;  Surgeon: Marcene Corning, MD;  Location: [facility] ORS;  Service: Orthopedics;  Laterality: Right;   TOTAL HIP ARTHROPLASTY Left 12/09/2013   Procedure: TOTAL HIP ARTHROPLASTY ANTERIOR APPROACH;  Surgeon: Velna Ochs, MD;  Location: [facility] OR;  Service: Orthopedics;  Laterality: Left;  left anterior total hip arthroplasty   TOTAL HIP ARTHROPLASTY Right 05/05/2014   Procedure: TOTAL HIP ARTHROPLASTY ANTERIOR APPROACH;  Surgeon: Velna Ochs, MD;  Location: [facility] OR;  Service: Orthopedics;  Laterality: Right;   TURBINATE REDUCTION     wisdom extracted       Current Outpatient Medications  Medication Sig Dispense Refill   acetaminophen (TYLENOL) 500 MG tablet Take 500 mg by mouth every 6 (six) hours as needed (pain/headaches).     albuterol (PROVENTIL) (2.5 MG/3ML) 0.083% nebulizer solution Take 3 mLs (2.5 mg total) by nebulization every 6 (six) hours as needed for  wheezing or shortness of breath. 75 mL 12   albuterol (VENTOLIN HFA) 108 (90 Base) MCG/ACT inhaler Inhale 2 puffs into the lungs every 6 (six) hours as needed for wheezing or shortness of breath. 6.7 g 6   apixaban (ELIQUIS) 5 MG TABS tablet Take 1 tablet (5 mg total) by mouth 2 (two) times daily. 200 tablet 2   atorvastatin (LIPITOR) 80 MG tablet TAKE 1 TABLET BY MOUTH ONCE  DAILY 100 tablet 0   Coenzyme Q10 300 MG CAPS Take 300 mg by mouth in the morning.     Cyanocobalamin (VITAMIN B 12)  500 MCG TABS Take 500 mcg by mouth daily. 1/4 tab weekly     fenofibrate 160 MG tablet TAKE 1 TABLET BY MOUTH DAILY 100 tablet 2   fexofenadine (ALLEGRA) 180 MG tablet Take 90 mg by mouth 2 (two) times daily.     Fluocinonide Emulsified Base 0.05 % CREA APPLY TO AFFECTED AREA(S)  TWO TIMES DAILY AS NEEDED 60 g 2   folic acid (FOLVITE) 400 MCG tablet Take 400 mcg by mouth in the morning.     Lysine 500 MG TABS Take 500 mg by mouth 3 (three) times daily.     Magnesium 250 MG TABS Take 125 mg by mouth 2 (two) times daily. With vitamin d     tadalafil (CIALIS) 20 MG tablet Take 1 tablet (20 mg total) by mouth daily as needed for erectile dysfunction. 10 tablet 11   No current facility-administered medications for this visit.    Allergies  Allergen Reactions   Amiodarone Other (See Comments)    Peripheral neuropathy resulted   Niacin Other (See Comments)    Caused an irregular heartbeat   Ace Inhibitors Cough   Mometasone Furoate Other (See Comments)    (Nasonex) Causes nasal bleeding and nose bleeds    Review of Systems negative except from HPI and PMH  Physical Exam BP (!) 106/58   Pulse 69   Ht 6\' 3"  (1.905 m)   Wt 200 lb 9.6 oz (91 kg)   SpO2 98%   BMI 25.07 kg/m  Well developed and well nourished in no acute distress HENT normal Neck supple with JVP-flat Clear Device pocket well healed; without hematoma or erythema.  There is no tethering  Regular rate and rhythm, no   gallop No   murmur Abd-soft with active BS No Clubbing cyanosis  edema Skin-warm and dry A & Oriented  Grossly normal sensory and motor function  ECG atrial pacing at 74 22/13/43 Left bundle branch block  Device function is normal. Programming changes none  See Paceart for details     Assessment and  Plan  Atrial fibrillation-paroxysmal  Symptomatic ventricular pacing  Pacemaker Biotronik        LBBB   Stenosis-aortic-mild  Nonischemic cardiomyopathy recurrent  Coronary artery disease A) prior stenting B) diffuse nonobstructive disease  Congestive heart failure acute/chronic class IIb  Erectile dysfunction  No interval atrial fibrillation, 1 isolated episode of atrial tachycardia  Repeat echo again is associated with LVH previously reviewed and identified as being erroneous.  Ejection fraction is stable.  Blood pressure precludes much medication.  He would like to go back on a beta-blocker.  Will try him on nebivolol at 2.5 mg daily.  He will decide whether he wants to try the Cialis.  Will plan to reassess his aortic stenosis in the spring

## 2023-07-15 LAB — CUP PACEART INCLINIC DEVICE CHECK
Date Time Interrogation Session: 20241029144436
Implantable Lead Connection Status: 753985
Implantable Lead Connection Status: 753985
Implantable Lead Implant Date: 19980115
Implantable Lead Implant Date: 19980115
Implantable Lead Location: 753859
Implantable Lead Location: 753860
Implantable Pulse Generator Implant Date: 20131007
Pulse Gen Serial Number: 66339555

## 2023-08-02 ENCOUNTER — Encounter: Payer: Self-pay | Admitting: Internal Medicine

## 2023-08-02 MED ORDER — NEBIVOLOL HCL 2.5 MG PO TABS: 2.5000 mg | ORAL_TABLET | Freq: Every day | ORAL | 1 refills | Status: DC

## 2023-09-01 ENCOUNTER — Other Ambulatory Visit: Payer: Self-pay | Admitting: Pulmonary Disease

## 2023-09-07 ENCOUNTER — Encounter: Payer: Self-pay | Admitting: Cardiology

## 2023-09-07 ENCOUNTER — Encounter: Payer: Self-pay | Admitting: Internal Medicine

## 2023-09-07 ENCOUNTER — Ambulatory Visit: Payer: Medicare Other | Attending: Cardiology | Admitting: Cardiology

## 2023-09-07 ENCOUNTER — Telehealth: Payer: Self-pay | Admitting: Internal Medicine

## 2023-09-07 VITALS — BP 111/72 | HR 119 | Wt 208.6 lb

## 2023-09-07 DIAGNOSIS — I495 Sick sinus syndrome: Secondary | ICD-10-CM

## 2023-09-07 DIAGNOSIS — I4819 Other persistent atrial fibrillation: Secondary | ICD-10-CM

## 2023-09-07 DIAGNOSIS — D6869 Other thrombophilia: Secondary | ICD-10-CM

## 2023-09-07 DIAGNOSIS — I4892 Unspecified atrial flutter: Secondary | ICD-10-CM

## 2023-09-07 LAB — CUP PACEART INCLINIC DEVICE CHECK
Date Time Interrogation Session: 20241227115433
Implantable Lead Connection Status: 753985
Implantable Lead Connection Status: 753985
Implantable Lead Implant Date: 19980115
Implantable Lead Implant Date: 19980115
Implantable Lead Location: 753859
Implantable Lead Location: 753860
Implantable Pulse Generator Implant Date: 20131007
Pulse Gen Serial Number: 66339555

## 2023-09-07 MED ORDER — DILTIAZEM HCL ER COATED BEADS 180 MG PO CP24: 180.0000 mg | ORAL_CAPSULE | Freq: Every day | ORAL | 1 refills | Status: DC

## 2023-09-07 NOTE — Progress Notes (Signed)
Left detailed message for patient and instructed them to come at 0945  and to be NPO after 0000.  Medications reviewed.    Advised patient to have a ride home and someone to stay with them for 24 hours after the procedure.

## 2023-09-07 NOTE — Telephone Encounter (Signed)
Spoke with pt who reports he developed cough and sore throat about 2-3 weeks ago.  Has been using nebulizer treatment which helped with cough.  Denies CP, fever or chills.  Pt reports he has since developed fatigue, SOB, and some abdominal bloating.  Pt repots he began checking his HR yesterday and had elevated rates at or above 100.  This morning his HR is 61 and he does feel some better today.  Pt's PPM does not send remote transmissions.  Pt scheduled with device clinic at 11am for further review.  Pt also advised he should contact his sleep provider regarding CPAP readings.  Pt verbalizes understanding and agrees with current plan.

## 2023-09-07 NOTE — Patient Instructions (Addendum)
Medication Instructions:  Your physician has recommended you make the following change in your medication:   Begin Cardizem 180mg  - 1 capsule by mouth daily  *If you need a refill on your cardiac medications before your next appointment, please call your pharmacy*   Lab Work: None ordered today  If you have labs (blood work) drawn today and your tests are completely normal, you will receive your results only by: MyChart Message (if you have MyChart) OR A paper copy in the mail If you have any lab test that is abnormal or we need to change your treatment, we will call you to review the results.   Testing/Procedures: Your physician has recommended that you have a Cardioversion (DCCV). Electrical Cardioversion uses a jolt of electricity to your heart either through paddles or wired patches attached to your chest. This is a controlled, usually prescheduled, procedure. Defibrillation is done under light anesthesia in the hospital, and you usually go home the day of the procedure. This is done to get your heart back into a normal rhythm. You are not awake for the procedure. Please see the instruction sheet given to you today.     Follow-Up: At [facility], you and your health needs are our priority.  As part of our continuing mission to provide you with exceptional heart care, we have created designated Provider Care Teams.  These Care Teams include your primary Cardiologist (physician) and Advanced Practice Providers (APPs -  Physician Assistants and Nurse Practitioners) who all work together to provide you with the care you need, when you need it.  We recommend signing up for the patient portal called "MyChart".  Sign up information is provided on this After Visit Summary.  MyChart is used to connect with patients for Virtual Visits (Telemedicine).  Patients are able to view lab/test results, encounter notes, upcoming appointments, etc.  Non-urgent messages can be sent to your provider  as well.   To learn more about what you can do with MyChart, go to [email or web address].    Your next appointment:   1 week after cardioversion with Afib Clinic  Other Instructions    Dear [name]  You are scheduled for a Cardioversion on Monday, December 30 with Dr. Flora Lipps.  Please arrive at the [facility] (Main Entrance A) at [facility]: [address] [place], [place] [address] at 10:00 AM (This time is 1.5 hour(s) before your procedure to ensure your preparation).   Free valet parking service is available. You will check in at ADMITTING.   *Please Note: You will receive a call the day before your procedure to confirm the appointment time. That time may have changed from the original time based on the schedule for that day.*   DIET:  Nothing to eat or drink after midnight except a sip of water with medications (see medication instructions below)  MEDICATION INSTRUCTIONS: !!IF ANY NEW MEDICATIONS ARE STARTED AFTER TODAY, PLEASE NOTIFY YOUR PROVIDER AS SOON AS POSSIBLE!!  FYI: Medications such as Semaglutide (Ozempic, [place]), Tirzepatide (Mounjaro, Zepbound), Dulaglutide (Trulicity), etc ("GLP1 agonists") AND Canagliflozin (Invokana), Dapagliflozin (Farxiga), Empagliflozin (Jardiance), Ertugliflozin (Steglatro), Bexagliflozin [employer]) or any combination with one of these drugs such as Invokamet (Canagliflozin/Metformin), Synjardy (Empagliflozin/Metformin), etc ("SGLT2 inhibitors") must be held around the time of a procedure. This is not a comprehensive list of all of these drugs. Please review all of your medications and talk to your provider if you take any one of these. If you are not sure, ask your  provider.   ** Continue taking your anticoagulant (blood thinner): Apixaban (Eliquis).  You will need to continue this after your procedure until you are told by your provider that it is safe to stop.    ** You may take your other morning medications with enough water  to get them down safely.  LABS: Your labs will be done at the hospital prior to your procedure - you will need to arrive 1 and 1/2 hours prior to your procedure.  FYI:  For your safety, and to allow [place] to monitor your vital signs accurately during the surgery/procedure we request: If you have artificial nails, gel coating, SNS etc, please have those removed prior to your surgery/procedure. Not having the nail coverings /polish removed may result in cancellation or delay of your surgery/procedure.  Your support person will be asked to wait in the waiting room during your procedure.  It is OK to have someone drop you off and come back when you are ready to be discharged.  You cannot drive after the procedure and will need someone to drive you home.  Bring your insurance cards.  *Special Note: Every effort is made to have your procedure done on time. Occasionally there are emergencies that occur at the hospital that may cause delays. Please be patient if a delay does occur.

## 2023-09-07 NOTE — Telephone Encounter (Signed)
Spoke with pt who reports he developed cough and sore throat about 2-3 weeks ago.  Has been using nebulizer treatment which helped with cough.  Denies CP, fever or chills.  Pt reports he has since developed fatigue, SOB, and some abdominal bloating.  Pt repots he began checking his HR yesterday and had elevated rates at or above 100.  This morning his HR is 61 and he does feel some better today.  Pt's PPM does not send remote transmissions.  Pt scheduled with device clinic at 11am for further review.  Pt verbalizes understanding and agrees with current plan.

## 2023-09-07 NOTE — Telephone Encounter (Signed)
This has been addressed in another encounter.  Please see that encounter for complete details.

## 2023-09-07 NOTE — Progress Notes (Signed)
  Electrophysiology Office Note:   Date:  09/07/2023  ID:  ARLOS KRUMM, DOB [birth year], MRN [id number]  Primary Cardiologist: Reatha Harps, MD Primary Heart Failure: None Electrophysiologist: Sherryl Manges, MD      History of Present Illness:   [name] is a 82 y.o. male with h/o sinus node dysfunction, atrial fibrillation/flutter, coronary artery disease seen today for routine electrophysiology followup.   Since last being seen in our clinic the patient reports an increased level of fatigue and shortness of breath over the last few weeks.  He came into device clinic and was found to be in atrial flutter.  Attempts were made to pace him out of atrial flutter into sinus rhythm, but each attempt was unsuccessful.  he denies chest pain, palpitations, dyspnea, PND, orthopnea, nausea, vomiting, dizziness, syncope, edema, weight gain, or early satiety.   Review of systems complete and found to be negative unless listed in HPI.      EP Information / Studies Reviewed:    EKG is not ordered today. EKG from 07/10/23 reviewed which showed A paced, LBBB      PPM Interrogation-  reviewed in detail today,  See PACEART report.  Device History: Biotronik Dual Chamber PPM implanted  for Sinus Node Dysfunction  Risk Assessment/Calculations:    CHA2DS2-VASc Score =     This indicates a  % annual risk of stroke. The patient's score is based upon:              Physical Exam:   VS:  BP 111/72   Pulse (!) 119   Wt 208 lb 9.6 oz (94.6 kg)   BMI 26.07 kg/m    Wt Readings from Last 3 Encounters:  09/07/23 208 lb 9.6 oz (94.6 kg)  07/10/23 200 lb 9.6 oz (91 kg)  05/23/23 203 lb (92.1 kg)     GEN: Well nourished, well developed in no acute distress NECK: No JVD; No carotid bruits CARDIAC: Tachycardic, no murmurs, rubs, gallops RESPIRATORY:  Clear to auscultation without rales, wheezing or rhonchi  ABDOMEN: Soft, non-tender, non-distended EXTREMITIES:  No edema; No deformity    ASSESSMENT AND PLAN:    SND s/p Biotronik PPM  Normal PPM function See Pace Art report No changes today Patient unable to be paced out of atrial flutter today.  Persistent atrial fibrillation/flutter: Patient presented in atrial flutter.  Attempts to pace him out of atrial flutter through his device were unsuccessful.  He [place] Alfredo need cardioversion.  Start diltiazem 180 mg daily in an attempt to improve heart rate.  This can be stopped post cardioversion.  Secondary hypercoagulable state: Compliant with anticoagulation  Disposition:   Follow up with Afib Clinic in 2 weeks  Signed, Tayton Decaire Jorja Loa, MD

## 2023-09-07 NOTE — Telephone Encounter (Signed)
Patient called to talk with Dr. Graciela Husbands or nurse regarding mychart message that he left.[name]

## 2023-09-09 ENCOUNTER — Encounter: Payer: Self-pay | Admitting: Internal Medicine

## 2023-09-09 ENCOUNTER — Encounter (HOSPITAL_COMMUNITY): Payer: Self-pay | Admitting: Anesthesiology

## 2023-09-09 NOTE — Anesthesia Preprocedure Evaluation (Signed)
Anesthesia Evaluation  Patient identified by MRN, date of birth, ID band Patient awake    Reviewed: Allergy & Precautions, NPO status , Patient's Chart, lab work & pertinent test results  Airway       Comment: Previous grade II view with MAC 4, mask with [employer]   Pulmonary sleep apnea           Cardiovascular + CAD, + Cardiac Stents (LAD in 2004), + Peripheral Vascular Disease and + DVT  + dysrhythmias (LBBB) Atrial Fibrillation + pacemaker (Biotronik) + Valvular Problems/Murmurs (mild) AS   HLD, bilateral popliteal artery aneurysm  TTE 07/03/2023: IMPRESSIONS     1. Left ventricular ejection fraction, by estimation, is 40 to 45%. Left  ventricular ejection fraction by 3D volume is 42 %. The left ventricle has  mildly decreased function. The left ventricle demonstrates regional wall  motion abnormalities (see  scoring diagram/findings for description). There is severe concentric left  ventricular hypertrophy. Left ventricular diastolic parameters are  indeterminate.   2. Right ventricular systolic function is normal. The right ventricular  size is normal. There is normal pulmonary artery systolic pressure.   3. Left atrial size was mildly dilated.   4. The mitral valve is abnormal. Trivial mitral valve regurgitation.   5. In the setting of decreased stroke volume index and calcified valve,  mild low flow low gradient aortic stenosis is suspected. The aortic valve  is tricuspid. There is mild calcification of the aortic valve. There is  mild thickening of the aortic  valve. Aortic valve regurgitation is not visualized. Mild aortic valve  stenosis. Aortic valve Vmax measures 2.19 m/s.   6. The inferior vena cava is normal in size with greater than 50%  respiratory variability, suggesting right atrial pressure of 3 mmHg.     Neuro/Psych  PSYCHIATRIC DISORDERS  Depression    Vertigo   Neuromuscular disease (neuropathy)     GI/Hepatic Diverticulosis    Endo/Other    Renal/GU CRFRenal disease     Musculoskeletal  (+) Arthritis , Osteoarthritis,    Abdominal   Peds  Hematology Lab Results      Component                Value               Date                      WBC                      4.4                 05/23/2023                HGB                      13.1                05/23/2023                HCT                      39.2                05/23/2023                MCV  98.2                05/23/2023                PLT                      124.0 (L)           05/23/2023              Anesthesia Other Findings On Eliquis  Reproductive/Obstetrics                             Anesthesia Physical Anesthesia Plan  ASA: 3  Anesthesia Plan: General   Post-op Pain Management:    Induction: Intravenous  PONV Risk Score and Plan: 2 and Treatment may vary due to age or medical condition  Airway Management Planned: Natural Airway, Nasal Cannula and Mask  Additional Equipment:   Intra-op Plan:   Post-operative Plan:   Informed Consent:      Dental advisory given  Plan Discussed with: CRNA and Anesthesiologist  Anesthesia Plan Comments: (Risks of general anesthesia discussed including, but not limited to, sore throat, hoarse voice, chipped/damaged teeth, injury to vocal cords, nausea and vomiting, allergic reactions, lung infection, heart attack, stroke, and death. All questions answered. )       Anesthesia Quick Evaluation

## 2023-09-10 ENCOUNTER — Encounter (HOSPITAL_COMMUNITY): Payer: Self-pay | Admitting: Anesthesiology

## 2023-09-10 ENCOUNTER — Other Ambulatory Visit: Payer: Self-pay | Admitting: Internal Medicine

## 2023-09-10 ENCOUNTER — Encounter: Payer: Self-pay | Admitting: Internal Medicine

## 2023-09-10 ENCOUNTER — Ambulatory Visit (HOSPITAL_COMMUNITY): Admission: RE | Admit: 2023-09-10 | Payer: Medicare Other | Source: Ambulatory Visit | Admitting: Cardiovascular Disease

## 2023-09-10 ENCOUNTER — Encounter (HOSPITAL_COMMUNITY): Admission: RE | Payer: Self-pay | Source: Ambulatory Visit

## 2023-09-10 DIAGNOSIS — I4892 Unspecified atrial flutter: Secondary | ICD-10-CM

## 2023-09-10 DIAGNOSIS — I480 Paroxysmal atrial fibrillation: Secondary | ICD-10-CM

## 2023-09-10 DIAGNOSIS — I4891 Unspecified atrial fibrillation: Secondary | ICD-10-CM

## 2023-09-10 SURGERY — CARDIOVERSION (CATH LAB)
Anesthesia: General

## 2023-09-11 ENCOUNTER — Telehealth: Payer: Self-pay | Admitting: Internal Medicine

## 2023-09-11 ENCOUNTER — Encounter: Payer: Self-pay | Admitting: Internal Medicine

## 2023-09-11 NOTE — Telephone Encounter (Signed)
Spoke with patient and he stated he is back in Atrial flutter for the last 4 hours.  Has some SOB and stated his heart rate has been 120 all day.  He woulld like to know what his next steps are.

## 2023-09-11 NOTE — Telephone Encounter (Signed)
 Patient c/o Palpitations:  STAT if patient reporting lightheadedness, shortness of breath, or chest pain  How long have you had palpitations/irregular HR/ Afib? Are you having the symptoms now? Patient says he has been in Aflutter since 11:00 this morning 120 beats a minute  Are you currently experiencing lightheadedness, SOB or CP?  Little shortness of breath  Do you have a history of afib (atrial fibrillation) or irregular heart rhythm?   Have you checked your BP or HR? (document readings if available):   Are you experiencing any other symptoms? No- patient wants to know asap, what does he needs to do?

## 2023-09-13 NOTE — Telephone Encounter (Signed)
 Pt has been scheduled to see Dr Graciela Husbands 09/14/23.

## 2023-09-14 ENCOUNTER — Ambulatory Visit: Payer: Medicare Other | Attending: Internal Medicine | Admitting: Internal Medicine

## 2023-09-14 ENCOUNTER — Encounter: Payer: Self-pay | Admitting: Internal Medicine

## 2023-09-14 VITALS — BP 104/66 | HR 116 | Ht 75.0 in | Wt 206.6 lb

## 2023-09-14 DIAGNOSIS — I4891 Unspecified atrial fibrillation: Secondary | ICD-10-CM | POA: Diagnosis not present

## 2023-09-14 DIAGNOSIS — I495 Sick sinus syndrome: Secondary | ICD-10-CM

## 2023-09-14 DIAGNOSIS — I4892 Unspecified atrial flutter: Secondary | ICD-10-CM | POA: Diagnosis not present

## 2023-09-14 DIAGNOSIS — Z950 Presence of cardiac pacemaker: Secondary | ICD-10-CM

## 2023-09-14 LAB — CUP PACEART INCLINIC DEVICE CHECK
Date Time Interrogation Session: 20250103122136
Implantable Lead Connection Status: 753985
Implantable Lead Connection Status: 753985
Implantable Lead Implant Date: 19980115
Implantable Lead Implant Date: 19980115
Implantable Lead Location: 753859
Implantable Lead Location: 753860
Implantable Pulse Generator Implant Date: 20131007
Pulse Gen Serial Number: 66339555

## 2023-09-14 NOTE — H&P (View-Only) (Signed)
 Patient Care Team: Norleen Lynwood ORN, MD as PCP - General (Internal Medicine) O'Neal, Darryle Ned, MD as PCP - Cardiology (Internal Medicine) Fernande Elspeth BROCKS, MD as PCP - Electrophysiology (Cardiology) Fernande Elspeth BROCKS, MD (Cardiology) Sheril Coy, MD as Consulting Physician (Orthopedic Surgery) Norleen Lynwood ORN, MD as Consulting Physician (Internal Medicine) Cleotilde Sewer, OD as Consulting Physician (Optometry)   HPI  [name] is a 83 y.o. 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   His pacemaker reached ERI 8/13 and he underwent generator replacement with a Biotronik device because of his CLS function  He took dofetilide  for his atrial fibrillation.  It was stopped by Dr. MILUS 5/13 at the time of his last ablation  Developed left bundle branch block.  Catheterization confirmed no interval progressive coronary disease his jailed LAD stent was patent.      From a cardiovascular perspective there are number of issues.  #1 he reports worsening shortness of breath over the last 6-12 months notable with climbing hills or stairs.  This is without evidence of volume overload.  Developed tachypalpitations and was seen by Dr. OVA last week.  Efforts to pace terminate were unsuccessful and arrangements were made for cardioversion.  He canceled this as he says that he was back in regular rhythm only to have recurrent atrial flutter.  Heart rates are up in the 120s associate with shortness of breath.  SABRA      DATE TEST EF    8/16 LHC   LADp-stented-patent; LADm-60%;D1-90% jailed LCXp-45%;RCAp-40%^  8/16 Echo  30-35%    3/17 Echo 35-40%    6 /17 +Echo   40-45 %    10/17 Echo  40-45  %    4/18 Echo   45%    9/19 Echo  50-55%    10/20 Echo  50-55%    2/22 Myo 20% No ischemia  3/22 Echo 40-45%    8/23 Echo  50-55%    3/24 Echo  40-45%   10/24 Echo  40-45%  AoMean grad 19       Date Cr K Hgb  4/18 1.08 4.7 13.8  9/19 1.15 4.7 12.7  10/20 1.20 4.8 14.3  5/22 1.03 4.4 13.4  3/24 1.15 4.0 13.4  9/24 1.07 4.0 13.1     Past Medical History:  Diagnosis Date   Allergic rhinitis    takes Allegra daily   Allergic rhinitis, cause unspecified 06/22/2012   Allergy    Arthritis    Atrial fibrillation (HCC)    takes Tikosyn  and Eliquis  daily   Bilateral popliteal artery aneurysm (HCC)    Chronic kidney disease (CKD), stage III (moderate) (HCC)    Clotting disorder (HCC)    Coronary artery disease    LAD stenting 2004 non-DES   Depression    took Zoloft 2yrs ago but nothing now   Diverticulosis    DVT (deep venous thrombosis) (HCC)    left popliteal artery greater than 10 years ago   Dysphagia    occasionally   History of blood clots 2003   left leg prior to fem pop   History of colon polyps    Hyperlipidemia    takes Tricor  and Lipitor daily   Insomnia    takes Ambien  nightly as needed   Joint pain    Joint swelling    Neuromuscular disorder (HCC)  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   OSA (obstructive sleep apnea)    CPAP   Pacemaker    Peripheral vascular disease (HCC)    Pleurisy    early 80's   Prostatitis    Sleep apnea    Urinary urgency     Past Surgical History:  Procedure Laterality Date   ATRIAL FIBRILLATION ABLATION N/A 06/30/2021   Procedure: ATRIAL FIBRILLATION ABLATION;  Surgeon: Kelsie Agent, MD;  Location: [facility] IN[facility]ASIVE [facility] LAB;  Service: Cardiovascular;  Laterality: N/A;   ATRIAL FIBRILLATION ABLATION N/A 01/09/2022   Procedure: ATRIAL FIBRILLATION ABLATION;  Surgeon: Kelsie Agent, MD;  Location: [facility];  Service: Cardiovascular;  Laterality: N/A;   CARDIAC CATHETERIZATION  09/11/2002   with 2 stents   CARDIAC CATHETERIZATION N/A 05/06/2015   Procedure: Left Heart Cath and Coronary Angiography;  Surgeon: Victory LELON Sharps, MD;  Location: [facility] INVASIVE CV LAB;  Service:  Cardiovascular;  Laterality: N/A;   CARDIOVERSION N/A 12/18/2020   Procedure: CARDIOVERSION;  Surgeon: Delford Maude BROCKS, MD;  Location: [facility] OR;  Service: Cardiovascular;  Laterality: N/A;   COLONOSCOPY     hand and arm surgery Right    as a teenager   HAND SURGERY Left    INGUINAL HERNIA REPAIR Right    IR RADIOLOGIST EVAL & MGMT  01/05/2021   JOINT REPLACEMENT Left 12/09/2013   Left Hip    JOINT REPLACEMENT Right 05/05/2014   Right Hip   left knee surgery     PACEMAKER INSERTION  09/25/1996   due to bradycardia   PERMANENT PACEMAKER GENERATOR CHANGE N/A 06/17/2012   Procedure: PERMANENT PACEMAKER GENERATOR CHANGE;  Surgeon: Elspeth BROCKS Sage, MD;  Location: [facility] CATH LAB;  Service: Cardiovascular;  Laterality: N/A;   right and left fem-pop bypass     SHOULDER ARTHROSCOPY WITH ROTATOR CUFF REPAIR Right 05/31/2021   Procedure: RIGHT SHOULDER ARTHROSCOPY ACROMIOPLASTY AND DEBRIDEMENT;  Surgeon: Sheril Maude, MD;  Location: [facility] ORS;  Service: Orthopedics;  Laterality: Right;   TOTAL HIP ARTHROPLASTY Left 12/09/2013   Procedure: TOTAL HIP ARTHROPLASTY ANTERIOR APPROACH;  Surgeon: Maude KANDICE Sheril, MD;  Location: [facility] OR;  Service: Orthopedics;  Laterality: Left;  left anterior total hip arthroplasty   TOTAL HIP ARTHROPLASTY Right 05/05/2014   Procedure: TOTAL HIP ARTHROPLASTY ANTERIOR APPROACH;  Surgeon: Maude KANDICE Sheril, MD;  Location: [facility] OR;  Service: Orthopedics;  Laterality: Right;   TURBINATE REDUCTION     wisdom extracted       Current Outpatient Medications  Medication Sig Dispense Refill   acetaminophen  (TYLENOL ) 500 MG tablet Take 500 mg by mouth every 6 (six) hours as needed (pain/headaches).     albuterol  (PROVENTIL ) (2.5 MG/3ML) 0.083% nebulizer solution USE 1 VIAL VIA NEBULIZER EVERY 6 HOURS AS NEEDED FOR WHEEZING OR SHORTNESS OF BREATH 120 mL 5   albuterol  (VENTOLIN  HFA) 108 (90 Base) MCG/ACT inhaler Inhale 2 puffs into the lungs every 6 (six) hours as needed for wheezing or  shortness of breath. 6.7 g 6   apixaban  (ELIQUIS ) 5 MG TABS tablet Take 1 tablet (5 mg total) by mouth 2 (two) times daily. 200 tablet 2   atorvastatin  (LIPITOR) 80 MG tablet TAKE 1 TABLET BY MOUTH ONCE  DAILY 90 tablet 2   Coenzyme Q10 300 MG CAPS Take 300 mg by mouth in the morning.     Cyanocobalamin  (VITAMIN B 12) 500 MCG TABS Take 500 mcg by mouth daily. 1/4 tab weekly  diltiazem  (CARDIZEM  CD) 180 MG 24 hr capsule Take 1 capsule (180 mg total) by mouth daily. 90 capsule 1   fenofibrate  160 MG tablet TAKE 1 TABLET BY MOUTH DAILY 100 tablet 2   fexofenadine (ALLEGRA) 180 MG tablet Take 90 mg by mouth 2 (two) times daily.     Fluocinonide  Emulsified Base 0.05 % CREA APPLY TO AFFECTED AREA(S)  TWO TIMES DAILY AS NEEDED 60 g 2   folic acid (FOLVITE) 400 MCG tablet Take 400 mcg by mouth in the morning.     Lysine  500 MG TABS Take 500 mg by mouth 3 (three) times daily.     Magnesium  250 MG TABS Take 125 mg by mouth 2 (two) times daily. With vitamin d      nebivolol  (BYSTOLIC ) 2.5 MG tablet Take 1 tablet (2.5 mg total) by mouth daily. 90 tablet 1   tadalafil  (CIALIS ) 20 MG tablet Take 1 tablet (20 mg total) by mouth daily as needed for erectile dysfunction. 10 tablet 11   No current facility-administered medications for this visit.    Allergies  Allergen Reactions   Amiodarone Other (See Comments)    Peripheral neuropathy resulted   Niacin Other (See Comments)    Caused an irregular heartbeat   Ace Inhibitors Cough   Mometasone Furoate Other (See Comments)    (Nasonex) Causes nasal bleeding and nose bleeds    Review of Systems negative except from HPI and PMH  Physical Exam BP 104/66   Pulse (!) 116   Ht 6' 3 (1.905 m)   Wt 206 lb 9.6 oz (93.7 kg)   SpO2 96%   BMI 25.82 kg/m  Well developed and well nourished in no acute distress HENT normal Neck supple with JVP-flat Clear Device pocket well healed; without hematoma or erythema.  There is no tethering  Rapid and regular  rate and rhythm, no  gallop  murmur Abd-soft with active BS No Clubbing cyanosis  edema Skin-warm and dry A & Oriented  Grossly normal sensory and motor function  ECG tachycardia with left axis and left bundle at a rate of 122.  Difficult to discern but electrograms confirm that this is atrial flutter with 2: 1 conduction  Device function is normal. Programming changes atrial sensitivity was decreased, multiple efforts were made to pace terminate the atrial flutter, eventually did degenerate  into atrial fibrillation. See Paceart for details     Assessment and  Plan  Atrial fibrillation-paroxysmal  Symptomatic ventricular pacing  Pacemaker Biotronik        LBBB   Stenosis-aortic-mild  Nonischemic cardiomyopathy recurrent  Coronary artery disease A) prior stenting B) diffuse nonobstructive disease  Congestive heart failure acute/chronic class IIb  Erectile dysfunction  Persistent atrial flutter with pace terminating acceleration atrial fibrillation.  Hopefully will terminate by itself otherwise we will plan to cardiovert scheduled for Tuesday.  Lengthy discussions as to how we might be able to better discern, because his sense that he had reverted to sinus rhythm I think was an error and that when in fact was happening as he was going between atrial flutter 2: 1 and 4: 1.  55 min taken for the above efforts and explanations

## 2023-09-14 NOTE — Patient Instructions (Signed)
 Medication Instructions:  Your physician recommends that you continue on your current medications as directed. Please refer to the Current Medication list given to you today.  *If you need a refill on your cardiac medications before your next appointment, please call your pharmacy*   Lab Work: None ordered.  If you have labs (blood work) drawn today and your tests are completely normal, you will receive your results only by: MyChart Message (if you have MyChart) OR A paper copy in the mail If you have any lab test that is abnormal or we need to change your treatment, we will call you to review the results.   Testing/Procedures: None ordered.    Follow-Up: At [facility], you and your health needs are our priority.  As part of our continuing mission to provide you with exceptional heart care, we have created designated Provider Care Teams.  These Care Teams include your primary Cardiologist (physician) and Advanced Practice Providers (APPs -  Physician Assistants and Nurse Practitioners) who all work together to provide you with the care you need, when you need it.  We recommend signing up for the patient portal called MyChart.  Sign up information is provided on this After Visit Summary.  MyChart is used to connect with patients for Virtual Visits (Telemedicine).  Patients are able to view lab/test results, encounter notes, upcoming appointments, etc.  Non-urgent messages can be sent to your provider as well.   To learn more about what you can do with MyChart, go to [email or web address].    Your next appointment:   To be scheduled  Other Instructions   { Dear [name]   You are scheduled for a Cardioversion on Tuesday, January 7 with Dr. Delford.  Please arrive at the [facility] (Main Entrance A) at [facility]: [address] [place], [place] [address] at 7:30 AM (This time is 1.5 hour(s) before your procedure to ensure your preparation).   Free  valet parking service is available. You will check in at ADMITTING.   *Please Note: You will receive a call the day before your procedure to confirm the appointment time. That time may have changed from the original time based on the schedule for that day.*   DIET:  Nothing to eat or drink after midnight except a sip of water with medications (see medication instructions below)  MEDICATION INSTRUCTIONS: !!IF ANY NEW MEDICATIONS ARE STARTED AFTER TODAY, PLEASE NOTIFY YOUR PROVIDER AS SOON AS POSSIBLE!!  FYI: Medications such as Semaglutide (Ozempic, Wegovy), Tirzepatide (Mounjaro, Zepbound), Dulaglutide (Trulicity), etc (GLP1 agonists) AND Canagliflozin (Invokana), Dapagliflozin  (Farxiga ), Empagliflozin (Jardiance), Ertugliflozin (Steglatro), Bexagliflozin [employer]) or any combination with one of these drugs such as Invokamet (Canagliflozin/Metformin), Synjardy (Empagliflozin/Metformin), etc (SGLT2 inhibitors) must be held around the time of a procedure. This is not a comprehensive list of all of these drugs. Please review all of your medications and talk to your provider if you take any one of these. If you are not sure, ask your provider.   Continue taking your anticoagulant (blood thinner): Apixaban  (Eliquis ). Do not miss any doses. You will need to continue this after your procedure until you are told by your provider that it is safe to stop.    You can take your other morning medications with enough water to get them down safely.  LABS:  Your labs will be done at the hospital prior to your procedure - you will need to arrive 1 and 1/2 hours prior to your procedure.  FYI:  For your safety, and to allow us  to monitor your vital signs accurately during the surgery/procedure we request: If you have artificial nails, gel coating, SNS etc, please have those removed prior to your surgery/procedure. Not having the nail coverings /polish removed may result in cancellation or delay of your  surgery/procedure.  Your support person will be asked to wait in the waiting room during your procedure.  It is OK to have someone drop you off and come back when you are ready to be discharged.  You cannot drive after the procedure and will need someone to drive you home.  Bring your insurance cards.  *Special Note: Every effort is made to have your procedure done on time. Occasionally there are emergencies that occur at the hospital that may cause delays. Please be patient if a delay does occur.

## 2023-09-14 NOTE — Progress Notes (Signed)
 Patient Care Team: Norleen Lynwood ORN, MD as PCP - General (Internal Medicine) O'Neal, Darryle Ned, MD as PCP - Cardiology (Internal Medicine) Fernande Elspeth BROCKS, MD as PCP - Electrophysiology (Cardiology) Fernande Elspeth BROCKS, MD (Cardiology) Sheril Coy, MD as Consulting Physician (Orthopedic Surgery) Norleen Lynwood ORN, MD as Consulting Physician (Internal Medicine) Cleotilde Sewer, OD as Consulting Physician (Optometry)   HPI  [name] is a 83 y.o. 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   His pacemaker reached ERI 8/13 and he underwent generator replacement with a Biotronik device because of his CLS function  He took dofetilide  for his atrial fibrillation.  It was stopped by Dr. MILUS 5/13 at the time of his last ablation  Developed left bundle branch block.  Catheterization confirmed no interval progressive coronary disease his jailed LAD stent was patent.      From a cardiovascular perspective there are number of issues.  #1 he reports worsening shortness of breath over the last 6-12 months notable with climbing hills or stairs.  This is without evidence of volume overload.  Developed tachypalpitations and was seen by Dr. OVA last week.  Efforts to pace terminate were unsuccessful and arrangements were made for cardioversion.  He canceled this as he says that he was back in regular rhythm only to have recurrent atrial flutter.  Heart rates are up in the 120s associate with shortness of breath.  SABRA      DATE TEST EF    8/16 LHC   LADp-stented-patent; LADm-60%;D1-90% jailed LCXp-45%;RCAp-40%^  8/16 Echo  30-35%    3/17 Echo 35-40%    6 /17 +Echo   40-45 %    10/17 Echo  40-45  %    4/18 Echo   45%    9/19 Echo  50-55%    10/20 Echo  50-55%    2/22 Myo 20% No ischemia  3/22 Echo 40-45%    8/23 Echo  50-55%    3/24 Echo  40-45%   10/24 Echo  40-45%  AoMean grad 19       Date Cr K Hgb  4/18 1.08 4.7 13.8  9/19 1.15 4.7 12.7  10/20 1.20 4.8 14.3  5/22 1.03 4.4 13.4  3/24 1.15 4.0 13.4  9/24 1.07 4.0 13.1     Past Medical History:  Diagnosis Date   Allergic rhinitis    takes Allegra daily   Allergic rhinitis, cause unspecified 06/22/2012   Allergy    Arthritis    Atrial fibrillation (HCC)    takes Tikosyn  and Eliquis  daily   Bilateral popliteal artery aneurysm (HCC)    Chronic kidney disease (CKD), stage III (moderate) (HCC)    Clotting disorder (HCC)    Coronary artery disease    LAD stenting 2004 non-DES   Depression    took Zoloft 2yrs ago but nothing now   Diverticulosis    DVT (deep venous thrombosis) (HCC)    left popliteal artery greater than 10 years ago   Dysphagia    occasionally   History of blood clots 2003   left leg prior to fem pop   History of colon polyps    Hyperlipidemia    takes Tricor  and Lipitor daily   Insomnia    takes Ambien  nightly as needed   Joint pain    Joint swelling    Neuromuscular disorder (HCC)  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   OSA (obstructive sleep apnea)    CPAP   Pacemaker    Peripheral vascular disease (HCC)    Pleurisy    early 80's   Prostatitis    Sleep apnea    Urinary urgency     Past Surgical History:  Procedure Laterality Date   ATRIAL FIBRILLATION ABLATION N/A 06/30/2021   Procedure: ATRIAL FIBRILLATION ABLATION;  Surgeon: Kelsie Agent, MD;  Location: [facility] INVASIVE CV LAB;  Service: Cardiovascular;  Laterality: N/A;   ATRIAL FIBRILLATION ABLATION N/A 01/09/2022   Procedure: ATRIAL FIBRILLATION ABLATION;  Surgeon: Kelsie Agent, MD;  Location: [facility] INVASIVE CV LAB;  Service: Cardiovascular;  Laterality: N/A;   CARDIAC CATHETERIZATION  09/11/2002   with 2 stents   CARDIAC CATHETERIZATION N/A 05/06/2015   Procedure: Left Heart Cath and Coronary Angiography;  Surgeon: Victory LELON Sharps, MD;  Location: [facility] INVASIVE CV LAB;  Service:  Cardiovascular;  Laterality: N/A;   CARDIOVERSION N/A 12/18/2020   Procedure: CARDIOVERSION;  Surgeon: Delford Maude BROCKS, MD;  Location: [facility] OR;  Service: Cardiovascular;  Laterality: N/A;   COLONOSCOPY     hand and arm surgery Right    as a teenager   HAND SURGERY Left    INGUINAL HERNIA REPAIR Right    IR RADIOLOGIST EVAL & MGMT  01/05/2021   JOINT REPLACEMENT Left 12/09/2013   Left Hip    JOINT REPLACEMENT Right 05/05/2014   Right Hip   left knee surgery     PACEMAKER INSERTION  09/25/1996   due to bradycardia   PERMANENT PACEMAKER GENERATOR CHANGE N/A 06/17/2012   Procedure: PERMANENT PACEMAKER GENERATOR CHANGE;  Surgeon: Elspeth BROCKS Sage, MD;  Location: [facility] CATH LAB;  Service: Cardiovascular;  Laterality: N/A;   right and left fem-pop bypass     SHOULDER ARTHROSCOPY WITH ROTATOR CUFF REPAIR Right 05/31/2021   Procedure: RIGHT SHOULDER ARTHROSCOPY ACROMIOPLASTY AND DEBRIDEMENT;  Surgeon: Sheril Maude, MD;  Location: [facility] ORS;  Service: Orthopedics;  Laterality: Right;   TOTAL HIP ARTHROPLASTY Left 12/09/2013   Procedure: TOTAL HIP ARTHROPLASTY ANTERIOR APPROACH;  Surgeon: Maude KANDICE Sheril, MD;  Location: [facility] OR;  Service: Orthopedics;  Laterality: Left;  left anterior total hip arthroplasty   TOTAL HIP ARTHROPLASTY Right 05/05/2014   Procedure: TOTAL HIP ARTHROPLASTY ANTERIOR APPROACH;  Surgeon: Maude KANDICE Sheril, MD;  Location: [facility] OR;  Service: Orthopedics;  Laterality: Right;   TURBINATE REDUCTION     wisdom extracted       Current Outpatient Medications  Medication Sig Dispense Refill   acetaminophen  (TYLENOL ) 500 MG tablet Take 500 mg by mouth every 6 (six) hours as needed (pain/headaches).     albuterol  (PROVENTIL ) (2.5 MG/3ML) 0.083% nebulizer solution USE 1 VIAL VIA NEBULIZER EVERY 6 HOURS AS NEEDED FOR WHEEZING OR SHORTNESS OF BREATH 120 mL 5   albuterol  (VENTOLIN  HFA) 108 (90 Base) MCG/ACT inhaler Inhale 2 puffs into the lungs every 6 (six) hours as needed for wheezing or  shortness of breath. 6.7 g 6   apixaban  (ELIQUIS ) 5 MG TABS tablet Take 1 tablet (5 mg total) by mouth 2 (two) times daily. 200 tablet 2   atorvastatin  (LIPITOR) 80 MG tablet TAKE 1 TABLET BY MOUTH ONCE  DAILY 90 tablet 2   Coenzyme Q10 300 MG CAPS Take 300 mg by mouth in the morning.     Cyanocobalamin  (VITAMIN B 12) 500 MCG TABS Take 500 mcg by mouth daily. 1/4 tab weekly  diltiazem  (CARDIZEM  CD) 180 MG 24 hr capsule Take 1 capsule (180 mg total) by mouth daily. 90 capsule 1   fenofibrate  160 MG tablet TAKE 1 TABLET BY MOUTH DAILY 100 tablet 2   fexofenadine (ALLEGRA) 180 MG tablet Take 90 mg by mouth 2 (two) times daily.     Fluocinonide  Emulsified Base 0.05 % CREA APPLY TO AFFECTED AREA(S)  TWO TIMES DAILY AS NEEDED 60 g 2   folic acid (FOLVITE) 400 MCG tablet Take 400 mcg by mouth in the morning.     Lysine  500 MG TABS Take 500 mg by mouth 3 (three) times daily.     Magnesium  250 MG TABS Take 125 mg by mouth 2 (two) times daily. With vitamin d      nebivolol  (BYSTOLIC ) 2.5 MG tablet Take 1 tablet (2.5 mg total) by mouth daily. 90 tablet 1   tadalafil  (CIALIS ) 20 MG tablet Take 1 tablet (20 mg total) by mouth daily as needed for erectile dysfunction. 10 tablet 11   No current facility-administered medications for this visit.    Allergies  Allergen Reactions   Amiodarone Other (See Comments)    Peripheral neuropathy resulted   Niacin Other (See Comments)    Caused an irregular heartbeat   Ace Inhibitors Cough   Mometasone Furoate Other (See Comments)    (Nasonex) Causes nasal bleeding and nose bleeds    Review of Systems negative except from HPI and PMH  Physical Exam BP 104/66   Pulse (!) 116   Ht 6' 3 (1.905 m)   Wt 206 lb 9.6 oz (93.7 kg)   SpO2 96%   BMI 25.82 kg/m  Well developed and well nourished in no acute distress HENT normal Neck supple with JVP-flat Clear Device pocket well healed; without hematoma or erythema.  There is no tethering  Rapid and regular  rate and rhythm, no  gallop  murmur Abd-soft with active BS No Clubbing cyanosis  edema Skin-warm and dry A & Oriented  Grossly normal sensory and motor function  ECG tachycardia with left axis and left bundle at a rate of 122.  Difficult to discern but electrograms confirm that this is atrial flutter with 2: 1 conduction  Device function is normal. Programming changes atrial sensitivity was decreased, multiple efforts were made to pace terminate the atrial flutter, eventually did degenerate  into atrial fibrillation. See Paceart for details     Assessment and  Plan  Atrial fibrillation-paroxysmal  Symptomatic ventricular pacing  Pacemaker Biotronik        LBBB   Stenosis-aortic-mild  Nonischemic cardiomyopathy recurrent  Coronary artery disease A) prior stenting B) diffuse nonobstructive disease  Congestive heart failure acute/chronic class IIb  Erectile dysfunction  Persistent atrial flutter with pace terminating acceleration atrial fibrillation.  Hopefully will terminate by itself otherwise we will plan to cardiovert scheduled for Tuesday.  Lengthy discussions as to how we might be able to better discern, because his sense that he had reverted to sinus rhythm I think was an error and that when in fact was happening as he was going between atrial flutter 2: 1 and 4: 1.  55 min taken for the above efforts and explanations

## 2023-09-15 ENCOUNTER — Telehealth: Payer: Self-pay | Admitting: Student

## 2023-09-15 NOTE — Telephone Encounter (Addendum)
   Patient reports he has been exhausted and short of breath since being in atrial flutter. Was evaluated by Dr. Fernande yesterday and scheduled for DCCV on Tuesday. Reports he has experienced more shortness of breath today but has been very active in going several places.  He called to see if his cardioversion could be moved up. I informed him that the schedule appears to be full on Monday but I will send a note to our schedulers in case there is a cancellation. At this time, would tentatively keep plans for DCCV on Tuesday. We reviewed warning signs which would warrant Emergency Department evaluation in the interim. He voiced understanding of this and was appreciative of the return call.  Signed, Laymon CHRISTELLA Qua, PA-C 09/15/2023, 4:28 PM Pager: [phone]

## 2023-09-16 ENCOUNTER — Encounter: Payer: Self-pay | Admitting: Internal Medicine

## 2023-09-16 NOTE — Pre-Procedure Instructions (Signed)
 Received message that patient would like to move his cardioversion up if possible.  We do have a spot open for Monday 09/17/23.  Spoke with patient is grateful to be able to get procedure done tomorrow.  He will arrive tomorrow at 7:30 and follow all of the instructions previously given to him.

## 2023-09-17 ENCOUNTER — Ambulatory Visit (HOSPITAL_COMMUNITY)
Admission: RE | Admit: 2023-09-17 | Discharge: 2023-09-17 | Disposition: A | Discharge status: Home / Self Care | Payer: Medicare Other | Source: Ambulatory Visit | Attending: Internal Medicine | Admitting: Internal Medicine

## 2023-09-17 ENCOUNTER — Ambulatory Visit (HOSPITAL_COMMUNITY): Payer: Medicare Other | Admitting: Anesthesiology

## 2023-09-17 ENCOUNTER — Encounter (HOSPITAL_COMMUNITY)
Admission: RE | Disposition: A | Discharge status: Home / Self Care | Payer: Self-pay | Source: Ambulatory Visit | Attending: Internal Medicine

## 2023-09-17 ENCOUNTER — Other Ambulatory Visit: Payer: Self-pay

## 2023-09-17 DIAGNOSIS — I4892 Unspecified atrial flutter: Secondary | ICD-10-CM | POA: Insufficient documentation

## 2023-09-17 DIAGNOSIS — I4891 Unspecified atrial fibrillation: Secondary | ICD-10-CM

## 2023-09-17 DIAGNOSIS — G4733 Obstructive sleep apnea (adult) (pediatric): Secondary | ICD-10-CM | POA: Diagnosis not present

## 2023-09-17 DIAGNOSIS — I2510 Atherosclerotic heart disease of native coronary artery without angina pectoris: Secondary | ICD-10-CM

## 2023-09-17 DIAGNOSIS — Z950 Presence of cardiac pacemaker: Secondary | ICD-10-CM | POA: Diagnosis not present

## 2023-09-17 DIAGNOSIS — N529 Male erectile dysfunction, unspecified: Secondary | ICD-10-CM | POA: Diagnosis not present

## 2023-09-17 DIAGNOSIS — I495 Sick sinus syndrome: Secondary | ICD-10-CM | POA: Diagnosis not present

## 2023-09-17 DIAGNOSIS — I428 Other cardiomyopathies: Secondary | ICD-10-CM | POA: Diagnosis not present

## 2023-09-17 DIAGNOSIS — Z955 Presence of coronary angioplasty implant and graft: Secondary | ICD-10-CM | POA: Diagnosis not present

## 2023-09-17 DIAGNOSIS — I447 Left bundle-branch block, unspecified: Secondary | ICD-10-CM | POA: Diagnosis not present

## 2023-09-17 DIAGNOSIS — I480 Paroxysmal atrial fibrillation: Secondary | ICD-10-CM | POA: Insufficient documentation

## 2023-09-17 DIAGNOSIS — G4730 Sleep apnea, unspecified: Secondary | ICD-10-CM | POA: Diagnosis not present

## 2023-09-17 DIAGNOSIS — I700 Atherosclerosis of aorta: Secondary | ICD-10-CM | POA: Diagnosis not present

## 2023-09-17 DIAGNOSIS — I509 Heart failure, unspecified: Secondary | ICD-10-CM | POA: Diagnosis not present

## 2023-09-17 DIAGNOSIS — Z7901 Long term (current) use of anticoagulants: Secondary | ICD-10-CM | POA: Insufficient documentation

## 2023-09-17 HISTORY — PX: CARDIOVERSION: EP1203

## 2023-09-17 LAB — POCT I-STAT, CHEM 8
BUN: 21 mg/dL (ref 8–23)
Calcium, Ion: 1.19 mmol/L (ref 1.15–1.40)
Chloride: 108 mmol/L (ref 98–111)
Creatinine, Ser: 1.1 mg/dL (ref 0.61–1.24)
Glucose, Bld: 84 mg/dL (ref 70–99)
HCT: 40 % (ref 39.0–52.0)
Hemoglobin: 13.6 g/dL (ref 13.0–17.0)
Potassium: 4.4 mmol/L (ref 3.5–5.1)
Sodium: 143 mmol/L (ref 135–145)
TCO2: 24 mmol/L (ref 22–32)

## 2023-09-17 SURGERY — CARDIOVERSION (CATH LAB)
Anesthesia: General

## 2023-09-17 MED ORDER — SODIUM CHLORIDE 0.9 % IV SOLN: INTRAVENOUS | Status: DC

## 2023-09-17 MED ORDER — LIDOCAINE 2% (20 MG/ML) 5 ML SYRINGE
INTRAMUSCULAR | Status: DC | PRN
  Administered 2023-09-17: 100 mg via INTRAVENOUS

## 2023-09-17 MED ORDER — PROPOFOL 10 MG/ML IV BOLUS
INTRAVENOUS | Status: DC | PRN
  Administered 2023-09-17: 40 mg via INTRAVENOUS

## 2023-09-17 SURGICAL SUPPLY — 1 items: PAD DEFIB RADIO PHYSIO CONN (PAD) ×1 IMPLANT

## 2023-09-17 NOTE — Anesthesia Preprocedure Evaluation (Addendum)
 Anesthesia Evaluation  Patient identified by MRN, date of birth, ID band Patient awake    Reviewed: Allergy & Precautions, NPO status , Patient's Chart, lab work & pertinent test results, reviewed documented beta blocker date and time   History of Anesthesia Complications Negative for: history of anesthetic complications  Airway Mallampati: III  TM Distance: >3 FB     Dental   Pulmonary sleep apnea , neg COPD   breath sounds clear to auscultation       Cardiovascular + CAD, + Cardiac Stents, + Peripheral Vascular Disease and +CHF  + dysrhythmias Atrial Fibrillation + pacemaker  Rhythm:Irregular Rate:Normal     Neuro/Psych neg Seizures PSYCHIATRIC DISORDERS  Depression     Neuromuscular disease    GI/Hepatic ,,,(+) neg Cirrhosis        Endo/Other    Renal/GU CRFRenal disease     Musculoskeletal  (+) Arthritis ,    Abdominal   Peds  Hematology   Anesthesia Other Findings   Reproductive/Obstetrics                             Anesthesia Physical Anesthesia Plan  ASA: 2  Anesthesia Plan: General   Post-op Pain Management:    Induction: Intravenous  PONV Risk Score and Plan: 1 and Ondansetron   Airway Management Planned:   Additional Equipment:   Intra-op Plan:   Post-operative Plan:   Informed Consent: I have reviewed the patients History and Physical, chart, labs and discussed the procedure including the risks, benefits and alternatives for the proposed anesthesia with the patient or authorized representative who has indicated his/her understanding and acceptance.     Dental advisory given  Plan Discussed with: CRNA  Anesthesia Plan Comments:        Anesthesia Quick Evaluation

## 2023-09-17 NOTE — Interval H&P Note (Signed)
 History and Physical Interval Note:  09/17/2023 8:52 AM  [name]  has presented today for surgery, with the diagnosis of AFIB-AFLUTTER.  The various methods of treatment have been discussed with the patient and family. After consideration of risks, benefits and other options for treatment, the patient has consented to  Procedure(s): CARDIOVERSION (N/A) as a surgical intervention.  The patient's history has been reviewed, patient examined, no change in status, stable for surgery.  I have reviewed the patient's chart and labs.  Questions were answered to the patient's satisfaction.     Anuhea Gassner A Keenan Trefry

## 2023-09-17 NOTE — Anesthesia Postprocedure Evaluation (Signed)
 Anesthesia Post Note  Patient: [name]  Procedure(s) Performed: CARDIOVERSION     Patient location during evaluation: PACU Anesthesia Type: General Level of consciousness: awake and alert Pain management: pain level controlled Vital Signs Assessment: post-procedure vital signs reviewed and stable Respiratory status: spontaneous breathing, nonlabored ventilation, respiratory function stable and patient connected to nasal cannula oxygen Cardiovascular status: blood pressure returned to baseline and stable Postop Assessment: no apparent nausea or vomiting Anesthetic complications: no   No notable events documented.  Last Vitals:  Vitals:   09/17/23 0930 09/17/23 0940  BP: 120/82 120/69  Pulse: 73 76  Resp: (!) 23 10  Temp:    SpO2: 92% 91%    Last Pain:  Vitals:   09/17/23 0925  TempSrc: Temporal  PainSc: 0-No pain                 Lynwood MARLA Cornea

## 2023-09-17 NOTE — CV Procedure (Signed)
 Procedure: Electrical Cardioversion Indications:  Atrial Fibrillation  Procedure Details:  Consent: Risks of procedure as well as the alternatives and risks of each were explained to the (patient/caregiver).  Consent for procedure obtained.  Time Out: Verified patient identification, verified procedure, site/side was marked, verified correct patient position, special equipment/implants available, medications/allergies/relevent history reviewed, required imaging and test results available. PERFORMED.  Patient placed on cardiac monitor, pulse oximetry, supplemental oxygen as necessary.  Sedation given:  propofol  per anesthesia Pacer pads placed anterior chest.  Cardioverted 1 time(s).  Cardioversion with synchronized biphasic 200J shock.  Evaluation: Findings: Post procedure EKG shows:  APVS Complications: None Patient did tolerate procedure well.  Time Spent Directly with the Patient:  30 minutes   Kortni Hasten A Lyrik Dockstader 09/17/2023, 9:22 AM

## 2023-09-17 NOTE — Transfer of Care (Signed)
 Immediate Anesthesia Transfer of Care Note  Patient: [name]  Procedure(s) Performed: CARDIOVERSION  Patient Location: Cath Lab  Anesthesia Type:General  Level of Consciousness: drowsy  Airway & Oxygen Therapy: Patient Spontanous Breathing and Patient connected to nasal cannula oxygen  Post-op Assessment: Report given to RN and Post -op Vital signs reviewed and stable  Post vital signs: Reviewed and stable  Last Vitals:  Vitals Value Taken Time  BP    Temp    Pulse 148 09/17/23 0909  Resp 24 09/17/23 0909  SpO2 91 % 09/17/23 0909  Vitals shown include unfiled device data.  Last Pain:  Vitals:   09/17/23 0809  TempSrc:   PainSc: 0-No pain         Complications: No notable events documented.

## 2023-09-18 ENCOUNTER — Ambulatory Visit: Payer: Medicare Other | Admitting: Physician Assistant

## 2023-09-18 ENCOUNTER — Encounter (HOSPITAL_COMMUNITY): Payer: Self-pay | Admitting: Internal Medicine

## 2023-09-27 NOTE — Progress Notes (Signed)
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   Seen in EP Clinic 09/06/24 & was in AFL that was not responsive to pacing termination attempts. He was planned for DCCV but the pt canceled as he thought he was back in SR. He was seen in clinic again on 09/13/33 and planned for cardioversion.  He S/p DCCV on 09/17/23 requiring one 200j shock with conversion to APVS rhythm.   Since last being seen in our clinic the patient reports he feels so much better after his cardioversion. He notes he was quite swollen before the procedure and has since lost all the fluid. He reports his activity tolerance is coming back.  He is anxious to get back to the gym this weekend.   he denies chest pain, palpitations, dyspnea, PND, orthopnea, nausea, vomiting, dizziness, syncope, edema, weight gain, or early satiety.   Review of systems complete and found to be negative unless listed in HPI.   EP Information / Studies Reviewed:    EKG is ordered today. Personal review as below.      PPM Interrogation-  reviewed in detail today,  See PACEART report.  Device History: Biotronik Dual Chamber PPM implanted 09/25/96, generator change 06/17/2012 for Sinus Node Dysfunction  Studies:  Myoview 10/2020 > no evidence of ischemia  ECHO 06/2023 > LVEF 40-45%  Arrhythmia / AAD AF s/p Ablation 01/2022 with additional mapping and ablation in the [place] [facility]  Tikosyn > stopped in 01/2012 after ablation by Dr. Johney Frame  Risk Assessment/Calculations:    CHA2DS2-VASc Score = 4   This indicates a 4.8% annual risk of stroke. The patient's score is based  upon: CHF History: 1 HTN History: 0 Diabetes History: 0 Stroke History: 0 Vascular Disease History: 1 Age Score: 2 Gender Score: 0             Physical Exam:   VS:  BP 126/74   Pulse 70   Ht 6\' 3"  (1.905 m)   Wt 192 lb (87.1 kg)   SpO2 98%   BMI 24.00 kg/m    Wt Readings from Last 3 Encounters:  09/28/23 192 lb (87.1 kg)  09/17/23 197 lb (89.4 kg)  09/14/23 206 lb 9.6 oz (93.7 kg)     GEN: Well nourished, well developed in no acute distress NECK: No JVD; No carotid bruits CARDIAC: Regular rate and rhythm, no murmurs, rubs, gallops RESPIRATORY:  Clear to auscultation without rales, wheezing or rhonchi  ABDOMEN: Soft, non-tender, non-distended EXTREMITIES:  No edema; No deformity   ASSESSMENT AND PLAN:    SND s/p Biotronik PPM  -Normal PPM function -See Pace Art report -No changes today  Paroxysmal Atrial Fibrillation CHA2DS2-VASc 4.  -pt has stopped his pre-procedure diltiazem and feels well  -continue bystolic 2.5 mg daily  -discussed getting a Sales executive for monitoring at home  Secondary Hypercoagulable State  -continue Eliquis 5mg  BID, dose reviewed and appropriate by wt/cr  NICM LBBB CAD s/p Stent for diffuse Non-Obs Disease -no anginal symptoms  Disposition:   Follow up with Dr. Graciela Husbands in 6 months  Signed, Canary Brim, NP-C, AGACNP-BC [facility] -  Electrophysiology  09/28/2023, 4:50 PM

## 2023-09-28 ENCOUNTER — Encounter: Payer: Self-pay | Admitting: Pulmonary Disease

## 2023-09-28 ENCOUNTER — Ambulatory Visit: Payer: Medicare Other | Attending: Pulmonary Disease | Admitting: Pulmonary Disease

## 2023-09-28 VITALS — BP 126/74 | HR 70 | Ht 75.0 in | Wt 192.0 lb

## 2023-09-28 DIAGNOSIS — I480 Paroxysmal atrial fibrillation: Secondary | ICD-10-CM

## 2023-09-28 DIAGNOSIS — I447 Left bundle-branch block, unspecified: Secondary | ICD-10-CM | POA: Diagnosis not present

## 2023-09-28 DIAGNOSIS — I4891 Unspecified atrial fibrillation: Secondary | ICD-10-CM

## 2023-09-28 DIAGNOSIS — D6869 Other thrombophilia: Secondary | ICD-10-CM

## 2023-09-28 DIAGNOSIS — I495 Sick sinus syndrome: Secondary | ICD-10-CM | POA: Diagnosis not present

## 2023-09-28 LAB — CUP PACEART INCLINIC DEVICE CHECK
Date Time Interrogation Session: 20250117164626
Implantable Lead Connection Status: 753985
Implantable Lead Connection Status: 753985
Implantable Lead Implant Date: 19980115
Implantable Lead Implant Date: 19980115
Implantable Lead Location: 753859
Implantable Lead Location: 753860
Implantable Pulse Generator Implant Date: 20131007
Pulse Gen Serial Number: 66339555

## 2023-09-28 NOTE — Patient Instructions (Addendum)
Medication Instructions:  Your physician recommends that you continue on your current medications as directed. Please refer to the Current Medication list given to you today.  *If you need a refill on your cardiac medications before your next appointment, please call your pharmacy*  Lab Work: None ordered If you have labs (blood work) drawn today and your tests are completely normal, you will receive your results only by: MyChart Message (if you have MyChart) OR A paper copy in the mail If you have any lab test that is abnormal or we need to change your treatment, we will call you to review the results.  Follow-Up: At [facility], you and your health needs are our priority.  As part of our continuing mission to provide you with exceptional heart care, we have created designated Provider Care Teams.  These Care Teams include your primary Cardiologist (physician) and Advanced Practice Providers (APPs -  Physician Assistants and Nurse Practitioners) who all work together to provide you with the care you need, when you need it.  Your next appointment:   6 month(s)  Provider:   Sherryl Manges, MD    Other Instructions [employer]  FDA-cleared EKG at your fingertips. - [employer]   [employer], [employer]. [email or web address]   FDA-cleared, clinical grade mobile EKG monitor: Lourena Simmonds is the most clinically-validated mobile EKG used by the world's leading cardiac care medical professionals.  This may be useful in monitoring palpitations.  We do not have access to have them emailed and reviewed but will be glad to review while in the office.

## 2023-10-10 ENCOUNTER — Other Ambulatory Visit: Payer: Self-pay | Admitting: Internal Medicine

## 2023-10-17 ENCOUNTER — Ambulatory Visit: Payer: Medicare Other

## 2023-10-17 VITALS — BP 119/70 | HR 65 | Ht 74.5 in | Wt 188.6 lb

## 2023-10-17 DIAGNOSIS — Z0000 Encounter for general adult medical examination without abnormal findings: Secondary | ICD-10-CM

## 2023-10-17 NOTE — Patient Instructions (Signed)
 Mr. Sequeira , Thank you for taking time to come for your Medicare Wellness Visit. I appreciate your ongoing commitment to your health goals. Please review the following plan we discussed and let me know if I can assist you in the future.   Referrals/Orders/Follow-Ups/Clinician Recommendations: It was very nice to meet you today.  Keep up the good work.  This is a list of the screening recommended for you and due dates:  Health Maintenance  Topic Date Due   Medicare Annual Wellness Visit  10/16/2024   Colon Cancer Screening  07/20/2026   DTaP/Tdap/Td vaccine (3 - Td or Tdap) 06/25/2030   Pneumonia Vaccine  Completed   Flu Shot  Completed   COVID-19 Vaccine  Completed   Zoster (Shingles) Vaccine  Completed   HPV Vaccine  Aged Out    Advanced directives: (Copy Requested) Please bring a copy of your health care power of attorney and living will to the office to be added to your chart at your convenience.  Next Medicare Annual Wellness Visit scheduled for next year: Yes

## 2023-10-17 NOTE — Progress Notes (Signed)
 Subjective:   [name] is a 83 y.o. male who presents for Medicare Annual/Subsequent preventive examination.  Visit Complete: In person   Cardiac Risk Factors include: advanced age (>57men, >25 women);Other (see comment);dyslipidemia;male gender, Risk factor comments: A-Fib,Peripheral vascular disease, OSA, CHF, CKD     Objective:    Today's Vitals   10/17/23 1440  BP: 119/70  Pulse: 65  SpO2: 100%  Weight: 188 lb 9.6 oz (85.5 kg)  Height: 6' 2.5 (1.892 m)   Body mass index is 23.89 kg/m.     10/17/2023    2:50 PM 11/10/2022    6:27 AM 11/06/2022   11:34 AM 01/09/2022    9:25 AM 10/31/2021   11:34 AM 06/30/2021    9:19 AM 05/31/2021    3:11 PM  Advanced Directives  Does Patient Have a Medical Advance Directive? Yes No Yes Yes Yes Yes Yes  Type of Estate Agent of [place];Living will  Healthcare Power of [place];Living will Healthcare Power of [place];Living will Healthcare Power of [place];Living will Healthcare Power of [place];Living will Healthcare Power of Attorney  Does patient want to make changes to medical advance directive?    No - Patient declined   No - Patient declined  Copy of Healthcare Power of Attorney in Chart? No - copy requested  No - copy requested  No - copy requested  No - copy requested  Would patient like information on creating a medical advance directive?       No - Patient declined    Current Medications (verified) Outpatient Encounter Medications as of 10/17/2023  Medication Sig   acetaminophen  (TYLENOL ) 500 MG tablet Take 500 mg by mouth every 6 (six) hours as needed (pain/headaches).   albuterol  (PROVENTIL ) (2.5 MG/3ML) 0.083% nebulizer solution USE 1 VIAL VIA NEBULIZER EVERY 6 HOURS AS NEEDED FOR WHEEZING OR SHORTNESS OF BREATH   albuterol  (VENTOLIN  HFA) 108 (90 Base) MCG/ACT inhaler Inhale 2 puffs into the lungs every 6 (six) hours as needed for wheezing or shortness of breath.   apixaban  (ELIQUIS ) 5 MG TABS  tablet Take 1 tablet (5 mg total) by mouth 2 (two) times daily.   atorvastatin  (LIPITOR) 80 MG tablet TAKE 1 TABLET BY MOUTH ONCE  DAILY   Coenzyme Q10 300 MG CAPS Take 300 mg by mouth in the morning.   Cyanocobalamin  (VITAMIN B 12) 500 MCG TABS Take 500 mcg by mouth daily. 1/4 tab weekly   diltiazem  (CARDIZEM  CD) 180 MG 24 hr capsule Take 1 capsule (180 mg total) by mouth daily.   fenofibrate  160 MG tablet TAKE 1 TABLET BY MOUTH DAILY   fexofenadine (ALLEGRA) 180 MG tablet Take 90 mg by mouth 2 (two) times daily.   Fluocinonide  Emulsified Base 0.05 % CREA APPLY TO AFFECTED AREA(S)  TWO TIMES DAILY AS NEEDED   folic acid (FOLVITE) 400 MCG tablet Take 400 mcg by mouth in the morning.   Lysine  500 MG TABS Take 500 mg by mouth 3 (three) times daily.   Magnesium  250 MG TABS Take 125 mg by mouth 2 (two) times daily. With vitamin d    nebivolol  (BYSTOLIC ) 2.5 MG tablet TAKE 1 TABLET BY MOUTH DAILY   tadalafil  (CIALIS ) 20 MG tablet Take 1 tablet (20 mg total) by mouth daily as needed for erectile dysfunction.   No facility-administered encounter medications on file as of 10/17/2023.    Allergies (verified) Amiodarone, Niacin, Ace inhibitors, and Mometasone furoate   History: Past Medical History:  Diagnosis Date  Allergic rhinitis    takes Allegra daily   Allergic rhinitis, cause unspecified 06/22/2012   Allergy    Arthritis    Atrial fibrillation (HCC)    takes Tikosyn  and Eliquis  daily   Bilateral popliteal artery aneurysm (HCC)    Chronic kidney disease (CKD), stage III (moderate) (HCC)    Clotting disorder (HCC)    Coronary artery disease    LAD stenting 2004 non-DES   Depression    took Zoloft 80yrs ago but nothing now   Diverticulosis    DVT (deep venous thrombosis) (HCC)    left popliteal artery greater than 10 years ago   Dysphagia    occasionally   History of blood clots 2003   left leg prior to fem pop   History of colon polyps    Hyperlipidemia    takes Tricor  and  Lipitor daily   Insomnia    takes Ambien  nightly as needed   Joint pain    Joint swelling    Neuromuscular disorder (HCC)    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   OSA (obstructive sleep apnea)    CPAP   Pacemaker    Peripheral vascular disease (HCC)    Pleurisy    early 80's   Prostatitis    Sleep apnea    Urinary urgency    Past Surgical History:  Procedure Laterality Date   ATRIAL FIBRILLATION ABLATION N/A 06/30/2021   Procedure: ATRIAL FIBRILLATION ABLATION;  Surgeon: Kelsie Agent, MD;  Location: [facility] INVASIVE CV LAB;  Service: Cardiovascular;  Laterality: N/A;   ATRIAL FIBRILLATION ABLATION N/A 01/09/2022   Procedure: ATRIAL FIBRILLATION ABLATION;  Surgeon: Kelsie Agent, MD;  Location: [facility] INVASIVE CV LAB;  Service: Cardiovascular;  Laterality: N/A;   CARDIAC CATHETERIZATION  09/11/2002   with 2 stents   CARDIAC CATHETERIZATION N/A 05/06/2015   Procedure: Left Heart Cath and Coronary Angiography;  Surgeon: Victory LELON Sharps, MD;  Location: [facility] INVASIVE CV LAB;  Service: Cardiovascular;  Laterality: N/A;   CARDIOVERSION N/A 12/18/2020   Procedure: CARDIOVERSION;  Surgeon: Delford Maude BROCKS, MD;  Location: [facility] OR;  Service: Cardiovascular;  Laterality: N/A;   CARDIOVERSION N/A 09/17/2023   Procedure: CARDIOVERSION;  Surgeon: Loni Soyla LABOR, MD;  Location: [facility] INVASIVE CV LAB;  Service: Cardiovascular;  Laterality: N/A;   COLONOSCOPY     hand and arm surgery Right    as a teenager   HAND SURGERY Left    INGUINAL HERNIA REPAIR Right    IR RADIOLOGIST EVAL & MGMT  01/05/2021   JOINT REPLACEMENT Left 12/09/2013   Left Hip    JOINT REPLACEMENT Right 05/05/2014   Right Hip   left knee surgery     PACEMAKER INSERTION  09/25/1996   due to bradycardia   PERMANENT PACEMAKER GENERATOR CHANGE N/A 06/17/2012   Procedure: PERMANENT PACEMAKER GENERATOR CHANGE;  Surgeon: Elspeth BROCKS Sage, MD;  Location[facility] CATH LAB;  Service: Cardiovascular;  Laterality: N/A;   right and left  fem-pop bypass     SHOULDER ARTHROSCOPY WITH ROTATOR CUFF REPAIR Right 05/31/2021   Procedure: RIGHT SHOULDER ARTHROSCOPY ACROMIOPLASTY AND DEBRIDEMENT;  Surgeon: Sheril Maude, MD;  Location: [facility] ORS;  Service: Orthopedics;  Laterality: Right;   TOTAL HIP ARTHROPLASTY Left 12/09/2013   Procedure: TOTAL HIP ARTHROPLASTY ANTERIOR APPROACH;  Surgeon: Maude KANDICE Sheril, MD;  Location: [facility] OR;  Service: Orthopedics;  Laterality: Left;  left anterior total hip arthroplasty   TOTAL HIP ARTHROPLASTY Right 05/05/2014   Procedure: TOTAL  HIP ARTHROPLASTY ANTERIOR APPROACH;  Surgeon: Maude KANDICE Herald, MD;  Location: [facility] OR;  Service: Orthopedics;  Laterality: Right;   TURBINATE REDUCTION     wisdom extracted      Family History  Problem Relation Age of Onset   Diabetes Mother    Heart disease Mother        Before age 30   Heart disease Father        Before age 58   Heart attack Father    Stroke Father    Aneurysm Father        bilat pop art aneurysms   Peripheral vascular disease Father        Popliteal Aneurysm   Diabetes Other        family hx   Sleep apnea Other        family hx   Allergic rhinitis Neg Hx    Angioedema Neg Hx    Asthma Neg Hx    Eczema Neg Hx    Immunodeficiency Neg Hx    Urticaria Neg Hx    Colon cancer Neg Hx    Esophageal cancer Neg Hx    Prostate cancer Neg Hx    Rectal cancer Neg Hx    Stomach cancer Neg Hx    Social History   Socioeconomic History   Marital status: Married    Spouse name: Lethia Caldron   Number of children: Not on file   Years of education: Not on file   Highest education level: Not on file  Occupational History   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       Lives with wife.  Son and family is with them for now.  [place] is living there while n college   Social Drivers of Health   Financial Resource Strain: Medium Risk (10/17/2023)   Overall Financial Resource Strain (CARDIA)    Difficulty of Paying Living Expenses: Somewhat hard  Food Insecurity: No Food Insecurity (10/17/2023)   Hunger Vital Sign    Worried About Running Out of Food in the Last Year: Never true    Ran Out of Food in the Last Year: Never true  Transportation Needs: No Transportation Needs (10/17/2023)   PRAPARE - Administrator, Civil Service (Medical): No    Lack of Transportation (Non-Medical): No  Physical Activity: Insufficiently Active (10/17/2023)   Exercise Vital Sign    Days of Exercise per Week: 1 day    Minutes of Exercise per Session: 90 min  Stress: Stress Concern Present (10/17/2023)   [employer] of Occupational Health - Occupational Stress Questionnaire    Feeling of Stress : Rather much  Social Connections: Moderately Isolated (10/17/2023)   Social Connection and Isolation Panel [NHANES]    Frequency of Communication with Friends and Family: Not on file    Frequency of Social Gatherings with Friends and Family: More than three times a week    Attends  Religious Services: Never    Active Member of Clubs or Organizations: No    Attends Banker Meetings: Never    Marital Status: Married    Tobacco Counseling Counseling given: Not Answered Tobacco comments: Never smoke 02/07/22   Clinical Intake:  Pre-visit preparation completed: Yes  Pain : No/denies pain     BMI - recorded: 23.89 Nutritional Status: BMI of 19-24  Normal Nutritional Risks: Nausea/ vomitting/ diarrhea (diarrhea) Diabetes: No  How often do you need to have someone  help you when you read instructions, pamphlets, or other written materials from your doctor or pharmacy?: 1 - Never  Interpreter Needed?: No  Information entered by :: Kincaid Tiger, RMA   Activities of Daily Living    10/17/2023    2:33 PM 09/17/2023    8:08 AM  In your present state of health, do you have any difficulty performing the following activities:  Hearing? 1 0  Comment has some hearing loss.   Vision? 0 0  Difficulty concentrating or making decisions? 0 0  Walking or climbing stairs? 0   Dressing or bathing? 0   Doing errands, shopping? 0   Preparing Food and eating ? N   Using the Toilet? N   In the past six months, have you accidently leaked urine? N   Do you have problems with loss of bowel control? N   Managing your Medications? N   Managing your Finances? N   Housekeeping or managing your Housekeeping? N     Patient Care Team: Norleen Lynwood ORN, MD as PCP - General (Internal Medicine) O'Neal, Darryle Ned, MD as PCP - Cardiology (Internal Medicine) Fernande Elspeth BROCKS, MD as PCP - Electrophysiology (Cardiology) Fernande Elspeth BROCKS, MD (Cardiology) Sheril Coy, MD as Consulting Physician (Orthopedic Surgery) Norleen Lynwood ORN, MD as Consulting Physician (Internal Medicine) Cleotilde Sewer, OD as Consulting Physician (Optometry)  Indicate any recent Medical Services you may have received from other than [facility] providers in the past year (date may be approximate).     Assessment:   This is a routine wellness examination for Radames.  Hearing/Vision screen Hearing Screening - Comments:: Has some hearing loss Vision Screening - Comments:: Wears eyeglasses   Goals Addressed             This Visit's Progress    Client understands the importance of follow-up with providers by attending scheduled visits       I would like to resume exercising at [facility]. Increase my physical activity to 3 days a week and keep my weight down.  Working on [place] going once a week to  [employer].-2025      Depression Screen    10/17/2023    2:57 PM 05/23/2023    1:19 PM 11/13/2022    3:59 PM 05/16/2022    1:22 PM 05/16/2022    1:08 PM 10/31/2021   11:35 AM 10/31/2021   11:33 AM  PHQ 2/9 Scores  PHQ - 2 Score 3 0 0 0  0 0  PHQ- 9 Score 3        Exception Documentation     Patient refusal      Fall Risk    10/17/2023    2:50 PM 05/23/2023    1:19 PM 11/06/2022   11:35 AM 05/16/2022    1:21 PM 05/16/2022    1:08 PM  Fall Risk   Falls in the past year? 1 0 0 0 0  Number falls in past yr: 0 0 0 0  Injury with Fall? 1 0 0 0 0  Risk for fall due to : No Fall Risks No Fall Risks No Fall Risks  Medication side effect  Follow up Falls prevention discussed;Falls evaluation completed Falls evaluation completed Falls prevention discussed  Falls evaluation completed    MEDICARE RISK AT HOME: Medicare Risk at Home Any stairs in or around the home?: Yes If so, are there any without handrails?: Yes Home free of loose throw rugs in walkways, pet beds, electrical cords, etc?: Yes Adequate lighting in your home to reduce risk of falls?: Yes Life alert?: No Use of a cane, walker or w/c?: No Grab bars in the bathroom?: No Shower chair or bench in shower?: No Elevated toilet seat or a handicapped toilet?: No  TIMED UP AND GO:  Was the test performed?  Yes  Length of time to ambulate 10 feet: 10 sec Gait steady and fast without use of assistive device    Cognitive Function:        10/17/2023    2:54 PM 11/06/2022   11:36 AM  6CIT Screen  What Year? 0 points 0 points  What month? 0 points 0 points  What time? 0 points 0 points  Count back from 20 0 points 0 points  Months in reverse 0 points 0 points  Repeat phrase 0 points 0 points  Total Score 0 points 0 points    Immunizations Immunization History  Administered Date(s) Administered   Fluad Quad(high Dose 65+) 06/25/2020, 05/10/2021, 06/17/2022, 05/22/2023   H1N1 08/11/2008   Influenza Split 06/21/2012   Influenza Whole  08/20/2008, 05/31/2010, 05/04/2013   Influenza, High Dose Seasonal PF 06/15/2014, 06/29/2018, 06/15/2019   Influenza-Unspecified 05/18/2015   Moderna Covid-19 Fall Seasonal Vaccine 24yrs & older 05/22/2023   PFIZER(Purple Top)SARS-COV-2 Vaccination 09/21/2019, 10/11/2019, 06/08/2020, 01/05/2021   Pfizer Covid-19 Vaccine Bivalent Booster 78yrs & up 09/02/2021   Pneumococcal Conjugate-13 04/07/2015   Pneumococcal Polysaccharide-23 09/11/2005   RSV,unspecified 08/17/2022   Td 02/02/2009   Tdap 06/25/2020   Zoster Recombinant(Shingrix) 02/02/2017, 06/01/2017   Zoster, Live 03/05/2007    TDAP status: Up to date  Flu Vaccine status: Up to date  Pneumococcal vaccine status: Up to date  Covid-19 vaccine status: Completed vaccines  Qualifies for Shingles Vaccine? Yes   Zostavax completed Yes   Shingrix Completed?: Yes  Screening Tests Health Maintenance  Topic Date Due   Medicare Annual Wellness (AWV)  10/16/2024   Colonoscopy  07/20/2026   DTaP/Tdap/Td (3 - Td or Tdap) 06/25/2030   Pneumonia Vaccine 23+ Years old  Completed   INFLUENZA VACCINE  Completed   COVID-19 Vaccine  Completed   Zoster Vaccines- Shingrix  Completed   HPV VACCINES  Aged Out    Health Maintenance  There are no preventive care reminders to display for this patient.   Colorectal cancer screening: Type of screening: Colonoscopy. Completed 07/20/2021. Repeat every 5 years  Lung Cancer Screening: (Low Dose CT Chest recommended if Age 76-80 years, 20 pack-year currently smoking OR have quit w/in 15years.) does not qualify.   Lung Cancer Screening Referral: N/A  Additional Screening:  Hepatitis C Screening: does not qualify;   Vision Screening: Recommended annual ophthalmology exams for early detection of glaucoma and other disorders of the eye. Is the patient up to date with their annual eye exam?  No  Who is the provider or what is the name of the office in which the patient attends annual eye exams?  Dr. Ginnie Pinal If pt is not established  with a provider, would they like to be referred to a provider to establish care? No .   Dental Screening: Recommended annual dental exams for proper oral hygiene   Community Resource Referral / Chronic Care Management: CRR required this visit?  No   CCM required this visit?  No     Plan:     I have personally reviewed and noted the following in the patient's chart:   Medical and social history Use of alcohol, tobacco or illicit drugs  Current medications and supplements including opioid prescriptions. Patient is not currently taking opioid prescriptions. Functional ability and status Nutritional status Physical activity Advanced directives List of other physicians Hospitalizations, surgeries, and ER visits in previous 12 months Vitals Screenings to include cognitive, depression, and falls Referrals and appointments  In addition, I have reviewed and discussed with patient certain preventive protocols, quality metrics, and best practice recommendations. A written personalized care plan for preventive services as well as general preventive health recommendations were provided to patient.     Lafern Brinkley L Royann Wildasin, CMA   10/17/2023   After Visit Summary: (MyChart) Due to this being a telephonic visit, the after visit summary with patients personalized plan was offered to patient via MyChart   Nurse Notes: Patient is up to date on all health maintenance.  He stated that he had a sore throat x 10 days but it has gotten better so far.  Patient will call office if it worsens to be examined.   He had no other concerns to address today.

## 2024-01-09 ENCOUNTER — Telehealth: Payer: Self-pay | Admitting: Internal Medicine

## 2024-01-09 ENCOUNTER — Ambulatory Visit (HOSPITAL_BASED_OUTPATIENT_CLINIC_OR_DEPARTMENT_OTHER)
Admission: RE | Admit: 2024-01-09 | Discharge: 2024-01-09 | Disposition: A | Discharge status: Home / Self Care | Source: Ambulatory Visit | Attending: Internal Medicine | Admitting: Internal Medicine

## 2024-01-09 ENCOUNTER — Other Ambulatory Visit: Payer: Self-pay

## 2024-01-09 DIAGNOSIS — K7460 Unspecified cirrhosis of liver: Secondary | ICD-10-CM | POA: Diagnosis not present

## 2024-01-09 DIAGNOSIS — K5732 Diverticulitis of large intestine without perforation or abscess without bleeding: Secondary | ICD-10-CM | POA: Diagnosis not present

## 2024-01-09 DIAGNOSIS — R161 Splenomegaly, not elsewhere classified: Secondary | ICD-10-CM | POA: Diagnosis not present

## 2024-01-09 DIAGNOSIS — N281 Cyst of kidney, acquired: Secondary | ICD-10-CM | POA: Diagnosis not present

## 2024-01-09 DIAGNOSIS — R1032 Left lower quadrant pain: Secondary | ICD-10-CM | POA: Insufficient documentation

## 2024-01-09 MED ORDER — IOHEXOL 300 MG/ML  SOLN
100.0000 mL | Freq: Once | INTRAMUSCULAR | Status: AC | PRN
  Administered 2024-01-09: 100 mL via INTRAVENOUS

## 2024-01-09 MED ORDER — AMOXICILLIN-POT CLAVULANATE 875-125 MG PO TABS: 1.0000 | ORAL_TABLET | Freq: Two times a day (BID) | ORAL | 0 refills | Status: DC

## 2024-01-09 NOTE — Telephone Encounter (Signed)
 Call patient about the results of his CT scan that showed acute diverticulitis without any signs of abscess.  I recommend that we go ahead and start antibiotic therapy for treatment of his diverticulitis.  Patient will keep us  updated about how he does with antibiotic therapy.  I did tell the patient to notify us  if his pain were to worsen or he were to develop fevers, nausea, or vomiting since this could represent progression of his diverticulitis.  Will start him on Augmentin  875 mg twice daily for 10 days, which was sent to his pharmacy.  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  Patient states that he does have a remote history of substantial alcohol use.  He has not had any alcohol for over 20 years at this time.  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  I told the patient that we would bring him back to clinic to further discuss this finding of cirrhosis and to get him started on some preventative care.  It is possible that he may have had cirrhosis that was undiagnosed for several years.  I can see on his last CT scan in 2022 that there was some finding of probable hepatic cirrhosis at that time.  Earlene Gleason, lets get him scheduled for a follow-up appointment to discuss this new finding of cirrhosis on imaging.

## 2024-01-09 NOTE — Telephone Encounter (Signed)
Patient returned call, requesting a call back. Please advise. °

## 2024-01-09 NOTE — Telephone Encounter (Signed)
 Pt made aware of Dr. Rosaline Coma recommendations: Pt was scheduled for a STAT  CT scan at [facility] today. 01/09/2024 . Pt to arrive at 1:50 PM. Pt to start drinking contrast at 2:00 PM . Pt CT scan at 4:00 PM. Pt made aware. Address provided for pt.  Pt verbalized understanding with all questions answered.  Routed as FYI

## 2024-01-09 NOTE — Telephone Encounter (Signed)
 Received a page to [facility] GI consult pager this morning.  Patient states that he is feeling left lower quadrant abdominal pain that is very similar to the abdominal pain that he felt during his last episode of diverticulitis with abscess that required hospitalization in 2022.  This pain started yesterday evening and has continued.  He is not really describing any other symptoms.  Denies fevers.  I went over options with the patient including trial of a course of antibiotics versus STAT CT scan.  Patient would like to proceed with a stat CT scan to know for sure whether or not he has diverticulitis or not.   Pod B triage, lets arrange for a STAT CT A/P with contrast to evaluate for his left lower quadrant abdominal pain.  Thank you

## 2024-01-09 NOTE — Telephone Encounter (Signed)
 Left message for pt to call back

## 2024-01-10 ENCOUNTER — Telehealth: Payer: Self-pay

## 2024-01-10 NOTE — Telephone Encounter (Signed)
 Called patient to schedule follow up appointment patient is schedule on 03/24/24 at 1:30 pm.

## 2024-01-10 NOTE — Telephone Encounter (Signed)
 [place], Ashton Blakes, RN; [place], April D; Salas Delgado, Jorge A Per pt ins no prior auth required       Previous Messages    ----- Message ----- From: Cristobal Donning, RN Sent: 01/09/2024  12:34 PM EDT To: [place]; April D McPeak; * Subject: STAT CT SCAN                                  Pt has STAT CT Scan Today. Please review.

## 2024-01-26 ENCOUNTER — Telehealth: Payer: Self-pay | Admitting: Student

## 2024-01-26 NOTE — Telephone Encounter (Signed)
    Patient called [name] with concerns that he was back in atrial fibrillation/ flutter. He has a history of atrial fibrillation s/p ablation x2 (last one in 01/2022). He subsequently developed atrial flutter and underwent DCCV in 09/2023.  He reports feeling very tired and fatigued over the last 3 days and also notes new dyspnea on exertion, bloating, and about a 5 pound weight gain in a week.  He denies any chest pain or dyspnea at rest.  He decided to check his pulse morning because symptoms reminded of when he was previously in atrial fibrillation/flutter.  He states he was unable to palpate his pulse.  He got out of his Fitbit noted he was tachycardic with rates in the 120s to 150s.  He denies any real palpitations.  No lightheadedness/dizziness or near syncope.  Shortly before I called him back, he states his heart rate dropped down to 55 bpm on his Fitbit and he is now able to palpate his pulse and it feels regular.  Suspect he did have recurrent atrial fibrillation/flutter and concerned that he may have some mild CHF from this.  He does not have any Lasix at home and he does not have any recent labs so do not feel comfortable prescribing Lasix over the phone without being able to see him.  Discussed early office visit versus going to the ED.  He would prefer an early office visit and I think this is reasonable.  However, I reviewed ED precautions.  Recommended he come back to the ED if he has recurrent and persistent tachycardia, shortness of breath, or any new symptoms such as chest pain, dizziness, or near syncope.  Patient voiced understanding and agreed.  Patient is followed by EP.  Will route this note to the EP scheduling pool and ask them to call patient on Monday morning to schedule a follow-up visit as soon as possible (preferably early next week).  Breiana Stratmann E Aryonna Gunnerson, PA-C 01/26/2024 1:19 PM

## 2024-01-27 ENCOUNTER — Encounter (HOSPITAL_COMMUNITY): Payer: Self-pay

## 2024-01-27 ENCOUNTER — Other Ambulatory Visit: Payer: Self-pay

## 2024-01-27 ENCOUNTER — Emergency Department (HOSPITAL_COMMUNITY)

## 2024-01-27 ENCOUNTER — Emergency Department (HOSPITAL_COMMUNITY)
Admission: EM | Admit: 2024-01-27 | Discharge: 2024-01-27 | Disposition: A | Discharge status: Home / Self Care | Attending: Emergency Medicine | Admitting: Emergency Medicine

## 2024-01-27 DIAGNOSIS — I480 Paroxysmal atrial fibrillation: Secondary | ICD-10-CM

## 2024-01-27 DIAGNOSIS — R918 Other nonspecific abnormal finding of lung field: Secondary | ICD-10-CM | POA: Diagnosis not present

## 2024-01-27 DIAGNOSIS — Z7901 Long term (current) use of anticoagulants: Secondary | ICD-10-CM | POA: Insufficient documentation

## 2024-01-27 DIAGNOSIS — N183 Chronic kidney disease, stage 3 unspecified: Secondary | ICD-10-CM | POA: Diagnosis not present

## 2024-01-27 DIAGNOSIS — Z950 Presence of cardiac pacemaker: Secondary | ICD-10-CM | POA: Diagnosis not present

## 2024-01-27 DIAGNOSIS — I4892 Unspecified atrial flutter: Secondary | ICD-10-CM | POA: Insufficient documentation

## 2024-01-27 DIAGNOSIS — Z96653 Presence of artificial knee joint, bilateral: Secondary | ICD-10-CM | POA: Insufficient documentation

## 2024-01-27 DIAGNOSIS — I2510 Atherosclerotic heart disease of native coronary artery without angina pectoris: Secondary | ICD-10-CM | POA: Diagnosis not present

## 2024-01-27 DIAGNOSIS — Z955 Presence of coronary angioplasty implant and graft: Secondary | ICD-10-CM | POA: Diagnosis not present

## 2024-01-27 DIAGNOSIS — R0602 Shortness of breath: Secondary | ICD-10-CM | POA: Diagnosis not present

## 2024-01-27 LAB — BASIC METABOLIC PANEL WITH GFR
Anion gap: 12 (ref 5–15)
BUN: 30 mg/dL — ABNORMAL HIGH (ref 8–23)
CO2: 21 mmol/L — ABNORMAL LOW (ref 22–32)
Calcium: 9.7 mg/dL (ref 8.9–10.3)
Chloride: 105 mmol/L (ref 98–111)
Creatinine, Ser: 1.53 mg/dL — ABNORMAL HIGH (ref 0.61–1.24)
GFR, Estimated: 45 mL/min — ABNORMAL LOW (ref >60)
Glucose, Bld: 161 mg/dL — ABNORMAL HIGH (ref 70–99)
Potassium: 4.1 mmol/L (ref 3.5–5.1)
Sodium: 138 mmol/L (ref 135–145)

## 2024-01-27 LAB — CBC
HCT: 42.3 % (ref 39.0–52.0)
Hemoglobin: 13.5 g/dL (ref 13.0–17.0)
MCH: 32.4 pg (ref 26.0–34.0)
MCHC: 31.9 g/dL (ref 30.0–36.0)
MCV: 101.4 fL — ABNORMAL HIGH (ref 80.0–100.0)
Platelets: 141 10*3/uL — ABNORMAL LOW (ref 150–400)
RBC: 4.17 MIL/uL — ABNORMAL LOW (ref 4.22–5.81)
RDW: 15.8 % — ABNORMAL HIGH (ref 11.5–15.5)
WBC: 6.3 10*3/uL (ref 4.0–10.5)
nRBC: 0 % (ref 0.0–0.2)

## 2024-01-27 LAB — TROPONIN I (HIGH SENSITIVITY)
Troponin I (High Sensitivity): 84 ng/L — ABNORMAL HIGH (ref <18)
Troponin I (High Sensitivity): 83 ng/L — ABNORMAL HIGH (ref <18)

## 2024-01-27 MED ORDER — ETOMIDATE 2 MG/ML IV SOLN
10.0000 mg | Freq: Once | INTRAVENOUS | Status: AC
  Administered 2024-01-27: 10 mg via INTRAVENOUS
  Filled 2024-01-27: qty 10

## 2024-01-27 MED ORDER — MAGNESIUM SULFATE 2 GM/50ML IV SOLN
INTRAVENOUS | Status: AC
  Administered 2024-01-27: 2 g via INTRAVENOUS
  Filled 2024-01-27: qty 50

## 2024-01-27 MED ORDER — ONDANSETRON HCL 4 MG/2ML IJ SOLN
4.0000 mg | Freq: Once | INTRAMUSCULAR | Status: AC
  Administered 2024-01-27: 4 mg via INTRAVENOUS
  Filled 2024-01-27: qty 2

## 2024-01-27 MED ORDER — MAGNESIUM SULFATE 2 GM/50ML IV SOLN: 2.0000 g | Freq: Once | INTRAVENOUS | Status: AC

## 2024-01-27 MED ORDER — NEBIVOLOL HCL 5 MG PO TABS: 5.0000 mg | ORAL_TABLET | Freq: Every day | ORAL | 0 refills | Status: DC

## 2024-01-27 NOTE — Consult Note (Signed)
 Cardiology Consult    Patient ID: [name] MRN: [id number]; DOB: 1940-11-30   Admission date: 01/27/2024  PCP:  Roslyn Coombe, MD   [facility] Providers Cardiologist:  Oneil Bigness, MD  Electrophysiologist:  Richardo Chandler, MD       Chief Complaint: atrial flutter with RVR  Patient Profile:   [name] is a 83 y.o. 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  History of Present Illness:   Mr. Riendeau is a 83 year old male with the above history who primarily follows with EP.  Patient does have a history of CAD with remote PCI with BMS to LAD in 2004.  Cardiac catheterization in 2016 showed diffuse noncritical CAD with 90% stenosis of ostial first diagonal which was jailed by previously placed LAD stent.  Medical therapy was recommended.  Last ischemic evaluation was a Myoview  in 2022 which was high risk due to reduced EF of 20% but no ischemia.  Echo in 11/2020 showed LVEF of 40-45%.  Last echo in 06/2023 showed LVEF of 40-45% with hypokinesis of the entire septum, entire inferior wall, and apical anterior segment as well as severe LVH and mild AS.  He has a history of atrial fibrillation and ablation in 06/2021 and 01/2022.  He did well from an arrhythmia standpoint until earlier this year when he developed atrial flutter.  He underwent DCCV in 09/2023.  He also has a history of sinus node dysfunction with Biotronik PPM.  Patient called our after-hours line yesterday with reports of feeling tired/fatigued over the last 3 days as well as new dyspnea on exertion, bloating and a 5 pound weight gain.   He decided to check his pulse yesterday morning because symptoms reminded of when he was in atrial fibrillation/flutter.  He was unable to palpate his pulse so he got out his Fitbit and heart rates are ranging the 120s to 150s.  However, his heart rate then dropped down to 55 bpm and he was able to palpate his pulse.  Early office visit versus going to the ED was discussed with patient preferred early office visit.   He resented to the ED today for further evaluation of palpitations, shortness of breath, and lethargy.  EKG showed atrial flutter, rate 150 bpm, with known LBBB.  He was hypotensive with BP of 85/51.  In the ER, his QRS was initially narrow, but that had widened out.  Due to this, it was thought that he was having another arrhythmia and he was cardioverted.  Therefore, he was cardioverted in the ED with restoration of sinus rhythm with a atrial paced rhythm.  He has significant troponin mildly elevated at 84.  Chest x-ray showed borderline heart size with increased interstitial markings in the lung bases (atelectasis versus early edema). WBC 6.3, Hgb 13.5, Plts 141. Na 138, K 4.1, Glucose 161, BUN 30, Cr 1.53.  Currently he feels well.  He has no chest pain or shortness of breath.  He is back to his normal activities.  He has previously been on amiodarone, but had side effects of numbness and tingling in his fingers.  Amiodarone was stopped.  He is also previously been on dofetilide , though this was stopped after his most recent ablation.  Past Medical History:  Diagnosis Date   Allergic rhinitis    takes Allegra daily   Allergic rhinitis, cause unspecified 06/22/2012   Allergy    Arthritis    Atrial fibrillation (HCC)    takes Tikosyn  and Eliquis  daily   Bilateral popliteal artery aneurysm (HCC)    Chronic kidney disease (CKD), stage III (moderate) (HCC)    Clotting disorder (HCC)    Coronary artery disease    LAD stenting 2004 non-DES   Depression    took Zoloft 63yrs ago but  nothing now   Diverticulosis    DVT (deep venous thrombosis) (HCC)    left popliteal artery greater than 10 years ago   Dysphagia    occasionally   History of blood clots 2003   left leg prior to fem pop   History of colon polyps    Hyperlipidemia    takes Tricor  and Lipitor daily   Insomnia    takes Ambien  nightly as needed   Joint pain    Joint swelling    Neuromuscular disorder (HCC)    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   OSA (obstructive sleep apnea)    CPAP   Pacemaker    Peripheral vascular disease (HCC)    Pleurisy    early 80's   Prostatitis    Sleep apnea    Urinary urgency     Past Surgical History:  Procedure Laterality Date   ATRIAL FIBRILLATION ABLATION N/A 06/30/2021   Procedure: ATRIAL FIBRILLATION ABLATION;  Surgeon: Jolly Needle, MD;  Location: [facility] INVASIVE CV LAB;  Service: Cardiovascular;  Laterality: N/A;   ATRIAL FIBRILLATION ABLATION N/A 01/09/2022   Procedure: ATRIAL FIBRILLATION ABLATION;  Surgeon: Jolly Needle, MD;  Location: [facility] INVASIVE CV [facility];  Service: Cardiovascular;  Laterality: N/A;   CARDIAC CATHETERIZATION  09/11/2002   with 2 stents   CARDIAC CATHETERIZATION N/A 05/06/2015   Procedure: Left Heart Cath and Coronary Angiography;  Surgeon: Arty Binning, MD;  Location: [facility] INVASIVE CV LAB;  Service: Cardiovascular;  Laterality: N/A;   CARDIOVERSION N/A 12/18/2020   Procedure: CARDIOVERSION;  Surgeon: Loyde Rule, MD;  Location: [facility] OR;  Service: Cardiovascular;  Laterality: N/A;   CARDIOVERSION N/A 09/17/2023   Procedure: CARDIOVERSION;  Surgeon: Euell Herrlich, MD;  Location: [facility] INVASIVE CV LAB;  Service: Cardiovascular;  Laterality: N/A;   COLONOSCOPY     hand and arm surgery Right    as a teenager   HAND SURGERY Left    INGUINAL HERNIA REPAIR Right    IR RADIOLOGIST EVAL & MGMT  01/05/2021   JOINT REPLACEMENT Left 12/09/2013   Left Hip    JOINT REPLACEMENT Right 05/05/2014   Right Hip   left knee surgery      PACEMAKER INSERTION  09/25/1996   due to bradycardia   PERMANENT PACEMAKER GENERATOR CHANGE N/A 06/17/2012   Procedure: PERMANENT PACEMAKER GENERATOR CHANGE;  Surgeon: Verona Goodwill, MD;  Location: [facility] CATH LAB;  Service: Cardiovascular;  Laterality: N/A;   right and left fem-pop bypass     SHOULDER ARTHROSCOPY WITH ROTATOR CUFF REPAIR Right 05/31/2021   Procedure: RIGHT SHOULDER ARTHROSCOPY ACROMIOPLASTY AND DEBRIDEMENT;  Surgeon: Dayne Even, MD;  Location: [facility] ORS;  Service: Orthopedics;  Laterality: Right;   TOTAL HIP ARTHROPLASTY Left 12/09/2013   Procedure: TOTAL HIP ARTHROPLASTY ANTERIOR APPROACH;  Surgeon: Donata Fryer  Delfino Fellers, MD;  Location: [facility] OR;  Service: Orthopedics;  Laterality: Left;  left anterior total hip arthroplasty   TOTAL HIP ARTHROPLASTY Right 05/05/2014   Procedure: TOTAL HIP ARTHROPLASTY ANTERIOR APPROACH;  Surgeon: Alphonzo Ask, MD;  Location: [facility] OR;  Service: Orthopedics;  Laterality: Right;   TURBINATE REDUCTION     wisdom extracted        Medications Prior to Admission: Prior to Admission medications   Medication Sig Start Date End Date Taking? Authorizing Provider  acetaminophen  (TYLENOL ) 500 MG tablet Take 500 mg by mouth every 6 (six) hours as needed (pain/headaches).    [provider]  albuterol  (PROVENTIL ) (2.5 MG/3ML) 0.083% nebulizer solution USE 1 VIAL VIA NEBULIZER EVERY 6 HOURS AS NEEDED FOR WHEEZING OR SHORTNESS OF BREATH 09/03/23   Wilfredo Hanly, MD  albuterol  (VENTOLIN  HFA) 108 (90 Base) MCG/ACT inhaler Inhale 2 puffs into the lungs every 6 (six) hours as needed for wheezing or shortness of breath. 04/03/23   Wilfredo Hanly, MD  amoxicillin -clavulanate (AUGMENTIN ) 875-125 MG tablet Take 1 tablet by mouth 2 (two) times daily. 01/09/24   Daina Drum, MD  apixaban  (ELIQUIS ) 5 MG TABS tablet Take 1 tablet (5 mg total) by mouth 2 (two) times daily. 05/08/23   Verona Goodwill, MD  atorvastatin  (LIPITOR) 80 MG tablet TAKE 1 TABLET BY  MOUTH ONCE  DAILY 09/10/23   Verona Goodwill, MD  Coenzyme Q10 300 MG CAPS Take 300 mg by mouth in the morning.    [provider]  Cyanocobalamin  (VITAMIN B 12) 500 MCG TABS Take 500 mcg by mouth daily. 1/4 tab weekly    [provider]  diltiazem  (CARDIZEM  CD) 180 MG 24 hr capsule Take 1 capsule (180 mg total) by mouth daily. 09/07/23   Samella Lucchetti, Babetta Lesch, MD  fenofibrate  160 MG tablet TAKE 1 TABLET BY MOUTH DAILY 05/21/23   Verona Goodwill, MD  fexofenadine (ALLEGRA) 180 MG tablet Take 90 mg by mouth 2 (two) times daily.    [provider]  Fluocinonide  Emulsified Base 0.05 % CREA APPLY TO AFFECTED AREA(S)  TWO TIMES DAILY AS NEEDED 01/16/19   Roslyn Coombe, MD  folic acid (FOLVITE) 400 MCG tablet Take 400 mcg by mouth in the morning.    [provider]  Lysine  500 MG TABS Take 500 mg by mouth 3 (three) times daily.    [provider]  Magnesium  250 MG TABS Take 125 mg by mouth 2 (two) times daily. With vitamin d     [provider]  nebivolol  (BYSTOLIC ) 2.5 MG tablet TAKE 1 TABLET BY MOUTH DAILY 10/11/23   Verona Goodwill, MD  tadalafil  (CIALIS ) 20 MG tablet Take 1 tablet (20 mg total) by mouth daily as needed for erectile dysfunction. 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       Lives with wife.  Son and family is with them for now.  Dietra Fraction is living there while n college   Social Drivers of Health   Financial Resource Strain: Medium Risk (10/17/2023)   Overall Financial Resource Strain (CARDIA)    Difficulty of Paying Living Expenses: Somewhat hard  Food Insecurity: No Food Insecurity (10/17/2023)   Hunger Vital Sign    Worried About Running Out of Food in the Last Year: Never true    Ran Out of Food in the Last Year: Never true  Transportation Needs: No Transportation Needs (10/17/2023)   PRAPARE - Administrator, Civil Service (Medical): No    Lack of Transportation (Non-Medical): No  Physical Activity: Insufficiently Active (10/17/2023)   Exercise Vital Sign    Days of Exercise per Week: 1 day    Minutes of Exercise per Session: 90 min  Stress: Stress Concern Present (10/17/2023)   [employer] of Occupational Health - Occupational Stress Questionnaire    Feeling of Stress : Rather much  Social Connections: Moderately Isolated (10/17/2023)   Social Connection and Isolation Panel [NHANES]    Frequency of Communication with Friends and Family: Not on file    Frequency of Social Gatherings with Friends and Family: More than three times a week    Attends Religious Services: Never    Database administrator or Organizations: No    Attends Banker Meetings: Never    Marital Status: Married  Catering manager Violence: Not At Risk (10/17/2023)   Humiliation, Afraid, Rape, and Kick questionnaire    Fear of Current or Ex-Partner: No    Emotionally Abused:  No    Physically Abused: No    Sexually Abused: No    Family History:   The patient's family history includes Aneurysm in his father; Diabetes in his mother and another family member; Heart attack in his father; Heart disease in his father and mother; Peripheral vascular disease in his father; Sleep apnea in an other family member; Stroke in his father. There is no history of Allergic rhinitis, Angioedema, Asthma, Eczema, Immunodeficiency, Urticaria, Colon cancer, Esophageal cancer, Prostate cancer, Rectal cancer, or Stomach cancer.    ROS:  Please see the history of present illness.  All other ROS reviewed and negative.     Physical Exam/Data:   Vitals:   01/27/24 1600 01/27/24 1615 01/27/24 1625 01/27/24 1630  BP: (!) 85/51 93/66 (!) 87/67 (!) 89/65  Pulse: (!) 116 63 (!) 58 (!) 58  Resp: (!) 26 18 (!) 25 (!) 29  Temp:      SpO2: 100% 100% 100% 100%  Weight:      Height:       No intake or output data in the 24 hours ending 01/27/24 1713    01/27/2024    3:18 PM 10/17/2023    2:40 PM 09/28/2023  1:49 PM  Last 3 Weights  Weight (lbs) 189 lb 188 lb 9.6 oz 192 lb  Weight (kg) 85.73 kg 85.548 kg 87.091 kg     Body mass index is 23.62 kg/m.  General:  Well nourished, well developed, in no acute distress HEENT: normal Neck: no JVD Vascular: No carotid bruits; Distal pulses 2+ bilaterally   Cardiac:  normal S1, S2; RRR; no murmur  Lungs:  clear to auscultation bilaterally, no wheezing, rhonchi or rales  Abd: soft, nontender, no hepatomegaly  Ext: no edema Musculoskeletal:  No deformities, BUE and BLE strength normal and equal Skin: warm and dry  Neuro:  CNs 2-12 intact, no focal abnormalities noted Psych:  Normal affect    EKG:  The ECG that was done was personally reviewed and demonstrates atrial flutter  Relevant CV Studies: TTE 07/03/2023  1. Left ventricular ejection fraction, by estimation, is 40 to 45%. Left  ventricular ejection fraction by 3D volume is 42 %. The  left ventricle has  mildly decreased function. The left ventricle demonstrates regional wall  motion abnormalities (see  scoring diagram/findings for description). There is severe concentric left  ventricular hypertrophy. Left ventricular diastolic parameters are  indeterminate.   2. Right ventricular systolic function is normal. The right ventricular  size is normal. There is normal pulmonary artery systolic pressure.   3. Left atrial size was mildly dilated.   4. The mitral valve is abnormal. Trivial mitral valve regurgitation.   5. In the setting of decreased stroke volume index and calcified valve,  mild low flow low gradient aortic stenosis is suspected. The aortic valve  is tricuspid. There is mild calcification of the aortic valve. There is  mild thickening of the aortic  valve. Aortic valve regurgitation is not visualized. Mild aortic valve  stenosis. Aortic valve Vmax measures 2.19 m/s.   6. The inferior vena cava is normal in size with greater than 50%  respiratory variability, suggesting right atrial pressure of 3 mmHg.    Laboratory Data:  High Sensitivity Troponin:   Recent Labs  Lab 01/27/24 1527  TROPONINIHS 84*      Chemistry Recent Labs  Lab 01/27/24 1527  NA 138  K 4.1  CL 105  CO2 21*  GLUCOSE 161*  BUN 30*  CREATININE 1.53*  CALCIUM  9.7  GFRNONAA 45*  ANIONGAP 12    No results for input(s): "PROT", "ALBUMIN ", "AST", "ALT", "ALKPHOS", "BILITOT" in the last 168 hours. Lipids No results for input(s): "CHOL", "TRIG", "HDL", "LABVLDL", "LDLCALC", "CHOLHDL" in the last 168 hours. Hematology Recent Labs  Lab 01/27/24 1527  WBC 6.3  RBC 4.17*  HGB 13.5  HCT 42.3  MCV 101.4*  MCH 32.4  MCHC 31.9  RDW 15.8*  PLT 141*   Thyroid  No results for input(s): "TSH", "FREET4" in the last 168 hours. BNPNo results for input(s): "BNP", "PROBNP" in the last 168 hours.  DDimer No results for input(s): "DDIMER" in the last 168 hours.   Radiology/Studies:   DG Chest Port 1 View Result Date: 01/27/2024 CLINICAL DATA:  Shortness of breath EXAM: PORTABLE CHEST 1 VIEW COMPARISON:  11/10/2022 FINDINGS: Left pacer remains in place, unchanged. Heart is borderline in size. Increased markings in the lung bases could reflect atelectasis or early edema. No effusions. No acute bony abnormality. IMPRESSION: Borderline heart size. Increased interstitial markings in the lung bases, atelectasis versus early edema. Electronically Signed   By: Janeece Mechanic M.D.   On: 01/27/2024 17:04   Assessment and Plan:  Paroxysmal atrial fibrillation/flutter: Had a recent cardioversion in January.  He unfortunately went back into atrial fibrillation/flutter.  He had a cardioversion in emergency room and is in sinus rhythm.  Would increase Bystolic  to 5 mg daily.  Would continue his diltiazem .  As he is in normal rhythm, Pelham Hennick have him follow-up in atrial fibrillation clinic.  He would be a candidate for ablation in the future which she would prefer.  We Sylvanus Telford call him to look for dates. Sinus node dysfunction: Post Biotronik pacemaker Secondary hypercoagulable state: Continue Eliquis  Chronic systolic heart failure: No obvious volume overload   Risk Assessment/Risk Scores:         CHA2DS2-VASc Score = 4   This indicates a 4.8% annual risk of stroke. The patient's score is based upon: CHF History: 1 HTN History: 0 Diabetes History: 0 Stroke History: 0 Vascular Disease History: 1 Age Score: 2 Gender Score: 0      For questions or updates, please contact [facility] Please consult [email or web address] for contact info under     Signed, Callie E Goodrich, PA-C  01/27/2024 5:13 PM

## 2024-01-27 NOTE — ED Triage Notes (Signed)
 Pt came in via POV d/t feeling like he is in A-Flutter again & some SOB. States that he is having heart palpitations since yesterday morning & feeling very lethargic. A/Ox4, denies pain.

## 2024-01-27 NOTE — Discharge Instructions (Signed)
 While you were in the emergency room, you were cardioverted.  We are going to increase your Bystolic  to 5 mg daily.  You may begin taking it tomorrow.  Please follow-up with your cardiologist within 1 week.  Return to the emergency room if you develop pain in your chest, feel lightheaded, lose consciousness, or develop difficulty breathing.  It was very nice to meet you both today.

## 2024-01-27 NOTE — ED Notes (Signed)
 Pt shocked at [address]

## 2024-01-27 NOTE — ED Provider Notes (Signed)
 [place] EMERGENCY DEPARTMENT AT [facility] Provider Note  CSN: [id number] Arrival date & time: 01/27/24 1457  Chief Complaint(s) Shortness of Breath and Possible A-Flutter  HPI [name] is a 83 y.o. male who is here today for a rapid heart rate.  Patient history of atrial fibrillation and atrial flutter.  Last evening he felt as though his heart rate was elevated.  Patient has been compliant with his medications.  He does take Eliquis .  Took a dose this morning.   Past Medical History Past Medical History:  Diagnosis Date   Allergic rhinitis    takes Allegra daily   Allergic rhinitis, cause unspecified 06/22/2012   Allergy    Arthritis    Atrial fibrillation (HCC)    takes Tikosyn  and Eliquis  daily   Bilateral popliteal artery aneurysm (HCC)    Chronic kidney disease (CKD), stage III (moderate) (HCC)    Clotting disorder (HCC)    Coronary artery disease    LAD stenting 2004 non-DES   Depression    took Zoloft 72yrs ago but nothing now   Diverticulosis    DVT (deep venous thrombosis) (HCC)    left popliteal artery greater than 10 years ago   Dysphagia    occasionally   History of blood clots 2003   left leg prior to fem pop   History of colon polyps    Hyperlipidemia    takes Tricor  and Lipitor daily   Insomnia    takes Ambien  nightly as needed   Joint pain    Joint swelling    Neuromuscular disorder (HCC)    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   OSA (obstructive sleep apnea)    CPAP   Pacemaker    Peripheral vascular disease (HCC)    Pleurisy    early 80's   Prostatitis    Sleep apnea    Urinary urgency    Patient Active Problem List   Diagnosis Date Noted   B12 deficiency 05/26/2023   Nasal fracture 11/17/2022   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   Idiopathic pulmonary fibrosis (HCC) 08/14/2021   Bronchiectasis (HCC) 08/14/2021   Secondary hypercoagulable state (HCC) 08/01/2021   Chronic anticoagulation 05/14/2021    Preop exam for internal medicine 05/14/2021   Protein-calorie malnutrition, severe 12/24/2020   Colonic diverticular abscess 12/15/2020   Acute renal failure superimposed on stage 3a chronic kidney disease (HCC) 12/15/2020   Severe sepsis (HCC) 12/15/2020   CKD (chronic kidney disease) stage 3, GFR 30-59 ml/min (HCC) 06/27/2020   Vertigo 06/27/2020   Olecranon bursitis, right elbow 06/27/2020   Sinus node dysfunction (HCC) 09/10/2019   LBBB (left bundle branch block) 09/10/2019   Hyperglycemia 04/19/2016   Chronic combined systolic and diastolic CHF (congestive heart failure) (HCC) 05/05/2015   Aftercare following surgery of the circulatory system, NEC 04/24/2014   Degenerative joint disease (DJD) of hip 12/09/2013   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   Osteoarthritis of left hip 11/05/2013   Chronic left sacroiliac joint pain 10/13/2013   Discoloration of skin of lower leg-Left 04/11/2013   Allergic rhinitis 06/22/2012   Encounter for well adult exam with abnormal findings 06/21/2012   Chronic sinusitis 06/21/2012   Aneurysm artery, popliteal (HCC) 01/30/2012   Erectile dysfunction 02/21/2011   Pacemaker-Biotronik 02/21/2011   Brachial neuritis or radiculitis 05/31/2010   Atrial fibrillation (HCC) 05/11/2009   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 Authorizing Provider  nebivolol  (BYSTOLIC ) 5 MG tablet Take 1 tablet (5 mg total) by mouth daily. 01/27/24 02/26/24 Yes Afton Horse T, DO  acetaminophen  (TYLENOL ) 500 MG tablet Take 500 mg by mouth every 6 (six) hours as needed (pain/headaches).    [provider]  albuterol  (PROVENTIL ) (2.5 MG/3ML) 0.083% nebulizer solution USE 1 VIAL VIA  NEBULIZER EVERY 6 HOURS AS NEEDED FOR WHEEZING OR SHORTNESS OF BREATH 09/03/23   Wilfredo Hanly, MD  albuterol  (VENTOLIN  HFA) 108 (90 Base) MCG/ACT inhaler Inhale 2 puffs into the lungs every 6 (six) hours as needed for wheezing or shortness of breath. 04/03/23   Wilfredo Hanly, MD  amoxicillin -clavulanate (AUGMENTIN ) 875-125 MG tablet Take 1 tablet by mouth 2 (two) times daily. 01/09/24   Daina Drum, MD  apixaban  (ELIQUIS ) 5 MG TABS tablet Take 1 tablet (5 mg total) by mouth 2 (two) times daily. 05/08/23   Verona Goodwill, MD  atorvastatin  (LIPITOR) 80 MG tablet TAKE 1 TABLET BY MOUTH ONCE  DAILY 09/10/23   Verona Goodwill, MD  Coenzyme Q10 300 MG CAPS Take 300 mg by mouth in the morning.    [provider]  Cyanocobalamin  (VITAMIN B 12) 500 MCG TABS Take 500 mcg by mouth daily. 1/4 tab weekly    [provider]  diltiazem  (CARDIZEM  CD) 180 MG 24 hr capsule Take 1 capsule (180 mg total) by mouth daily. 09/07/23   Lei Pump, MD  fenofibrate  160 MG tablet TAKE 1 TABLET BY MOUTH DAILY 05/21/23   Verona Goodwill, MD  fexofenadine (ALLEGRA) 180 MG tablet Take 90 mg by mouth 2 (two) times daily.    [provider]  Fluocinonide  Emulsified Base 0.05 % CREA APPLY TO AFFECTED AREA(S)  TWO TIMES DAILY AS NEEDED 01/16/19   Roslyn Coombe, MD  folic acid (FOLVITE) 400 MCG tablet Take 400 mcg by mouth in the morning.    [provider]  Lysine  500 MG TABS Take 500 mg by mouth 3 (three) times daily.    [provider]  Magnesium  250 MG TABS Take 125 mg by mouth 2 (two) times daily. With vitamin d     [provider]  tadalafil  (CIALIS ) 20 MG tablet Take 1 tablet (20 mg total) by mouth daily as needed for erectile dysfunction. 05/28/23   Roslyn Coombe, MD                                                                                                                                    Past Surgical History Past Surgical History:  Procedure  Laterality Date   ATRIAL FIBRILLATION ABLATION N/A 06/30/2021   Procedure: ATRIAL FIBRILLATION ABLATION;  Surgeon: Jolly Needle, MD;  Location: [facility] INVASIVE  CV LAB;  Service: Cardiovascular;  Laterality: N/A;   ATRIAL FIBRILLATION ABLATION N/A 01/09/2022   Procedure: ATRIAL FIBRILLATION ABLATION;  Surgeon: Jolly Needle, MD;  Location: [facility] INVASIVE CV LAB;  Service: Cardiovascular;  Laterality: N/A;   CARDIAC CATHETERIZATION  09/11/2002   with 2 stents   CARDIAC CATHETERIZATION N/A 05/06/2015   Procedure: Left Heart Cath and Coronary Angiography;  Surgeon: Arty Binning, MD;  Location[facility] INVASIVE CV LAB;  Service: Cardiovascular;  Laterality: N/A;   CARDIOVERSION N/A 12/18/2020   Procedure: CARDIOVERSION;  Surgeon: Loyde Rule, MD;  Location: [facility] OR;  Service: Cardiovascular;  Laterality: N/A;   CARDIOVERSION N/A 09/17/2023   Procedure: CARDIOVERSION;  Surgeon: Euell Herrlich, MD;  Location: [facility] INVASIVE CV LAB;  Service: Cardiovascular;  Laterality: N/A;   COLONOSCOPY     hand and arm surgery Right    as a teenager   HAND SURGERY Left    INGUINAL HERNIA REPAIR Right    IR RADIOLOGIST EVAL & MGMT  01/05/2021   JOINT REPLACEMENT Left 12/09/2013   Left Hip    JOINT REPLACEMENT Right 05/05/2014   Right Hip   left knee surgery     PACEMAKER INSERTION  09/25/1996   due to bradycardia   PERMANENT PACEMAKER GENERATOR CHANGE N/A 06/17/2012   Procedure: PERMANENT PACEMAKER GENERATOR CHANGE;  Surgeon: Verona Goodwill, MD;  Location: [facility] CATH LAB;  Service: Cardiovascular;  Laterality: N/A;   right and left fem-pop bypass     SHOULDER ARTHROSCOPY WITH ROTATOR CUFF REPAIR Right 05/31/2021   Procedure: RIGHT SHOULDER ARTHROSCOPY ACROMIOPLASTY AND DEBRIDEMENT;  Surgeon: Dayne Even, MD;  Location: [facility] ORS;  Service: Orthopedics;  Laterality: Right;   TOTAL HIP ARTHROPLASTY Left 12/09/2013   Procedure: TOTAL HIP ARTHROPLASTY ANTERIOR APPROACH;  Surgeon: Alphonzo Ask, MD;  Location: [facility] OR;   Service: Orthopedics;  Laterality: Left;  left anterior total hip arthroplasty   TOTAL HIP ARTHROPLASTY Right 05/05/2014   Procedure: TOTAL HIP ARTHROPLASTY ANTERIOR APPROACH;  Surgeon: Alphonzo Ask, MD;  Location: [facility] OR;  Service: Orthopedics;  Laterality: Right;   TURBINATE REDUCTION     wisdom extracted      Family History Family History  Problem Relation Age of Onset   Diabetes Mother    Heart disease Mother        Before age 62   Heart disease Father        Before age 71   Heart attack Father    Stroke Father    Aneurysm Father        bilat pop art aneurysms   Peripheral vascular disease Father        Popliteal Aneurysm   Diabetes Other        family hx   Sleep apnea Other        family hx   Allergic rhinitis Neg Hx    Angioedema Neg Hx    Asthma Neg Hx    Eczema Neg Hx    Immunodeficiency Neg Hx    Urticaria Neg Hx    Colon cancer Neg Hx    Esophageal cancer Neg Hx    Prostate cancer Neg Hx    Rectal cancer Neg Hx    Stomach cancer Neg Hx     Social History Social History   Tobacco Use   Smoking status: Never    Passive exposure: Past   Smokeless tobacco: Never   Tobacco comments:    Never smoke 02/07/22  Vaping Use   Vaping  status: Never Used  Substance Use Topics   Alcohol use: No    Alcohol/week: 0.0 standard drinks of alcohol    Comment: nothing since 2000   Drug use: No   Allergies Amiodarone, Niacin, Ace inhibitors, and Mometasone furoate  Review of Systems Review of Systems  Physical Exam Vital Signs  I have reviewed the triage vital signs BP 93/68   Pulse (!) 59   Temp (!) 97.4 F (36.3 C) (Temporal)   Resp (!) 25   Ht 6\' 3"  (1.905 m)   Wt 85.7 kg   SpO2 98%   BMI 23.62 kg/m   Physical Exam Vitals reviewed.  Cardiovascular:     Rate and Rhythm: Tachycardia present. Rhythm irregular.  Pulmonary:     Effort: Pulmonary effort is normal.     Breath sounds: No decreased breath sounds or wheezing.  Chest:     Chest wall: No  mass.  Neurological:     Mental Status: He is alert.     ED Results and Treatments Labs (all labs ordered are listed, but only abnormal results are displayed) Labs Reviewed  BASIC METABOLIC PANEL WITH GFR - Abnormal; Notable for the following components:      Result Value   CO2 21 (*)    Glucose, Bld 161 (*)    BUN 30 (*)    Creatinine, Ser 1.53 (*)    GFR, Estimated 45 (*)    All other components within normal limits  CBC - Abnormal; Notable for the following components:   RBC 4.17 (*)    MCV 101.4 (*)    RDW 15.8 (*)    Platelets 141 (*)    All other components within normal limits  TROPONIN I (HIGH SENSITIVITY) - Abnormal; Notable for the following components:   Troponin I (High Sensitivity) 84 (*)    All other components within normal limits  TROPONIN I (HIGH SENSITIVITY)                                                                                                                          Radiology DG Chest Port 1 View Result Date: 01/27/2024 CLINICAL DATA:  Shortness of breath EXAM: PORTABLE CHEST 1 VIEW COMPARISON:  11/10/2022 FINDINGS: Left pacer remains in place, unchanged. Heart is borderline in size. Increased markings in the lung bases could reflect atelectasis or early edema. No effusions. No acute bony abnormality. IMPRESSION: Borderline heart size. Increased interstitial markings in the lung bases, atelectasis versus early edema. Electronically Signed   By: Janeece Mechanic M.D.   On: 01/27/2024 17:04    Pertinent labs & imaging results that were available during my care of the patient were reviewed by me and considered in my medical decision making (see MDM for details).  Medications Ordered in ED Medications  etomidate  (AMIDATE ) injection 10 mg (10 mg Intravenous Given 01/27/24 1600)  ondansetron  (ZOFRAN ) injection 4 mg (4 mg Intravenous Given 01/27/24 1601)  magnesium  sulfate IVPB 2 g 50 mL (  0 g Intravenous Stopped 01/27/24 1723)                                                                                                                                      Procedures .Critical Care  Performed by: Nathanael Baker, DO Authorized by: Nathanael Baker, DO   Critical care provider statement:    Critical care time (minutes):  33   Critical care was necessary to treat or prevent imminent or life-threatening deterioration of the following conditions:  Circulatory failure and cardiac failure   Critical care was time spent personally by me on the following activities:  Development of treatment plan with patient or surrogate, discussions with consultants, evaluation of patient's response to treatment, examination of patient, ordering and review of laboratory studies, ordering and review of radiographic studies, ordering and performing treatments and interventions, pulse oximetry, re-evaluation of patient's condition and review of old charts .Sedation  Date/Time: 01/27/2024 5:44 PM  Performed by: Nathanael Baker, DO Authorized by: Nathanael Baker, DO   Consent:    Consent obtained:  Emergent situation Universal protocol:    Immediately prior to procedure, a time out was called: yes   Indications:    Procedure performed:  Cardioversion Pre-sedation assessment:    Time since last food or drink:  4   ASA classification: class 2 - patient with mild systemic disease     Mouth opening:  3 or more finger widths   Thyromental distance:  4 finger widths   Mallampati score:  II - soft palate, uvula, fauces visible   Neck mobility: normal     Pre-sedation assessments completed and reviewed: airway patency, cardiovascular function, mental status, nausea/vomiting and respiratory function   A pre-sedation assessment was completed prior to the start of the procedure Immediate pre-procedure details:    Reassessment: Patient reassessed immediately prior to procedure     Reviewed: vital signs     Verified: bag valve mask available, emergency equipment  available, intubation equipment available, IV patency confirmed, oxygen available, reversal medications available and suction available   Procedure details (see MAR for exact dosages):    Sedation:  Etomidate    Intended level of sedation: moderate (conscious sedation)   Analgesia:  None   Intra-procedure events: none     Total Provider sedation time (minutes):  15 Post-procedure details:   A post-sedation assessment was completed following the completion of the procedure.   Patient is stable for discharge or admission: yes     Procedure completion:  Tolerated well, no immediate complications .Cardioversion  Date/Time: 01/27/2024 5:45 PM  Performed by: Nathanael Baker, DO Authorized by: Nathanael Baker, DO   Consent:    Consent obtained:  Emergent situation   Alternatives discussed:  No treatment Pre-procedure details:    Cardioversion basis:  Emergent   Rhythm:  Ventricular tachycardia   Electrode placement:  Anterior-posterior Patient sedated: Yes. Refer to  sedation procedure documentation for details of sedation.  Attempt one:    Cardioversion mode:  Synchronous   Waveform:  Biphasic   Shock (Joules):  200   Shock outcome:  Conversion to normal sinus rhythm Post-procedure details:    Patient status:  Awake   Patient tolerance of procedure:  Tolerated well, no immediate complications   (including critical care time)  Medical Decision Making / ED Course   This patient presents to the ED for concern of rapid heart rate, this involves an extensive number of treatment options, and is a complaint that carries with it a high risk of complications and morbidity.  The differential diagnosis includes atrial flutter.  MDM: Patient with atrial flutter on EKG.  Heart rate consistently 150.  Patient has had an ablation for atrial fibrillation previously, unfortunately has developed atrial flutter.  This is the second time that he has had episodes of this.  He says that he was  cardioverted a few months ago.  He denies any fever, cough or shortness of breath.  Will plan to cardiovert patient.  Reassessment 4:10 PM-while we are setting up for cardioversion, patient can have sustained runs of V. tach.  Decision was then made to emergently cardiovert the patient.  Medications were already in the room, give the patient 10 mg of etomidate  and successfully cardioverted the patient.  Started magnesium  on the patient.  Patient's disposition has since changed, he will require admission.  Cardiology consultation ordered.  Repeat assessment 5:40 PM-patient was evaluated by cardiology, Dr. Lawana Pray.  They are able to interrogate the patient's pacemaker, evaluate the strips.  They believe that this continue to be atrial flutters the patient has at times had a wider complex during this.  They are recommending discharge with increase in the patient's Bystolic .  Pulmonary reevaluation of the patient, he does not have any chest pain is not short of breath.  He is ambulatory without any difficulty overall feels well. Believe this is an appropriate recommendation will discharge patient.    Lab Tests: -I ordered, reviewed, and interpreted labs.   The pertinent results include:   Labs Reviewed  BASIC METABOLIC PANEL WITH GFR - Abnormal; Notable for the following components:      Result Value   CO2 21 (*)    Glucose, Bld 161 (*)    BUN 30 (*)    Creatinine, Ser 1.53 (*)    GFR, Estimated 45 (*)    All other components within normal limits  CBC - Abnormal; Notable for the following components:   RBC 4.17 (*)    MCV 101.4 (*)    RDW 15.8 (*)    Platelets 141 (*)    All other components within normal limits  TROPONIN I (HIGH SENSITIVITY) - Abnormal; Notable for the following components:   Troponin I (High Sensitivity) 84 (*)    All other components within normal limits  TROPONIN I (HIGH SENSITIVITY)      EKG atrial flutter  EKG Interpretation Date/Time:    Ventricular Rate:     PR Interval:    QRS Duration:    QT Interval:    QTC Calculation:   R Axis:      Text Interpretation:           Imaging Studies ordered: I ordered imaging studies including chest x-ray I independently visualized and interpreted imaging. I agree with the radiologist interpretation   Medicines ordered and prescription drug management: Meds ordered this encounter  Medications   etomidate  (AMIDATE )  injection 10 mg   ondansetron  (ZOFRAN ) injection 4 mg   magnesium  sulfate IVPB 2 g 50 mL   magnesium  sulfate 2 GM/50ML IVPB    [place], Taylor R: cabinet override   nebivolol  (BYSTOLIC ) 5 MG tablet    Sig: Take 1 tablet (5 mg total) by mouth daily.    Dispense:  30 tablet    Refill:  0    -I have reviewed the patients home medicines and have made adjustments as needed  Critical interventions Management of wide-complex tachycardia   Cardiac Monitoring: The patient was maintained on a cardiac monitor.  I personally viewed and interpreted the cardiac monitored which showed an underlying rhythm of: Return to normal sinus rhythm   Reevaluation: After the interventions noted above, I reevaluated the patient and found that they have :improved  Co morbidities that complicate the patient evaluation  Past Medical History:  Diagnosis Date   Allergic rhinitis    takes Allegra daily   Allergic rhinitis, cause unspecified 06/22/2012   Allergy    Arthritis    Atrial fibrillation (HCC)    takes Tikosyn  and Eliquis  daily   Bilateral popliteal artery aneurysm (HCC)    Chronic kidney disease (CKD), stage III (moderate) (HCC)    Clotting disorder (HCC)    Coronary artery disease    LAD stenting 2004 non-DES   Depression    took Zoloft 36yrs ago but nothing now   Diverticulosis    DVT (deep venous thrombosis) (HCC)    left popliteal artery greater than 10 years ago   Dysphagia    occasionally   History of blood clots 2003   left leg prior to fem pop   History of colon  polyps    Hyperlipidemia    takes Tricor  and Lipitor daily   Insomnia    takes Ambien  nightly as needed   Joint pain    Joint swelling    Neuromuscular disorder (HCC)    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     Final Clinical Impression(s) / ED Diagnoses Final diagnoses:  Atrial flutter, unspecified type (HCC)     @PCDICTATION @    Afton Horse T, DO 01/27/24 1748

## 2024-01-31 ENCOUNTER — Telehealth: Payer: Self-pay | Admitting: Internal Medicine

## 2024-01-31 NOTE — Progress Notes (Signed)
 Electrophysiology Office Note:   Date:  02/02/2024  ID:  ZADYN YARDLEY, DOB August 06, 1941, MRN [id number]  Primary Cardiologist: Oneil Bigness, MD Electrophysiologist: Richardo Chandler, MD      History of Present Illness:   [name] is a 83 y.o. male with h/o SND s/p PPM, atrial fibrillation s/p PVI 01/2022, AFL, LBBB, chronic combined systolic & diastolic CHF, CKD III who is being seen today for evaluation of his atrial flutter.  Discussed the use of AI scribe software for clinical note transcription with the patient, who gave verbal consent to proceed.  History of Present Illness He has a history of atrial fibrillation and has undergone two prior ablations. Recently, he was diagnosed with atrial flutter and underwent cardioversion on  September 17, 2023. He has had a total of three cardioversions, including one during a hospital stay for diverticulitis about a year ago, although he was not informed of the reason for the cardioversion at that time.  He describes the symptoms of atrial flutter as 'absolutely horrible,' causing significant fatigue, cognitive difficulties, shortness of breath, urinary retention, constipation, bloating, and a weight gain of approximately ten pounds, which he attributes to fluid retention. His recovery from the recent cardioversion has been slow, with energy levels gradually improving to about 80-90% of normal. He experiences fatigue, particularly in the late afternoon.  He has been on dofetilide  for about fifteen years, which he feels was ineffective. He previously tried amiodarone but developed numbness in his fingers and toes, leading to its discontinuation. He has not experienced atrial fibrillation for nearly a year since his last ablation, but the atrial flutter has persisted.  He wants to address the atrial flutter, noting that it significantly impacts his quality of life, stating that it 'knocks the tar out of you.'    Review of systems complete and found  to be negative unless listed in HPI.   EP Information / Studies Reviewed:    EKG is not ordered today. EKG from 01/27/24 reviewed which showed AP with PVCs.     EKG 01/27/24:   Echo 07/03/23:   1. Left ventricular ejection fraction, by estimation, is 40 to 45%. Left  ventricular ejection fraction by 3D volume is 42 %. The left ventricle has  mildly decreased function. The left ventricle demonstrates regional wall  motion abnormalities (see scoring diagram/findings for description). There is severe concentric left ventricular hypertrophy. Left ventricular diastolic parameters are indeterminate.   2. Right ventricular systolic function is normal. The right ventricular  size is normal. There is normal pulmonary artery systolic pressure.   3. Left atrial size was mildly dilated.   4. The mitral valve is abnormal. Trivial mitral valve regurgitation.   5. In the setting of decreased stroke volume index and calcified valve,  mild low flow low gradient aortic stenosis is suspected. The aortic valve  is tricuspid. There is mild calcification of the aortic valve. There is  mild thickening of the aortic valve. Aortic valve regurgitation is not visualized. Mild aortic valve stenosis. Aortic valve Vmax measures 2.19 m/s.   6. The inferior vena cava is normal in size with greater than 50%  respiratory variability, suggesting right atrial pressure of 3 mmHg.    Risk Assessment/Calculations:    CHA2DS2-VASc Score = 4   This indicates a 4.8% annual risk of stroke. The patient's score is based upon: CHF History: 1 HTN History: 0 Diabetes History: 0 Stroke History: 0 Vascular Disease History: 1 Age Score: 2 Gender Score: 0  Physical Exam:   VS:  BP 110/66   Pulse 66   Ht 6\' 3"  (1.905 m)   Wt 205 lb (93 kg)   SpO2 99%   BMI 25.62 kg/m    Wt Readings from Last 3 Encounters:  02/01/24 205 lb (93 kg)  01/27/24 189 lb (85.7 kg)  10/17/23 188 lb 9.6 oz (85.5 kg)     GEN: Well  nourished, well developed in no acute distress NECK: No JVD CARDIAC: Normal rate, regular rhythm RESPIRATORY:  Clear to auscultation without rales, wheezing or rhonchi  ABDOMEN: Soft, non-distended EXTREMITIES:  No edema; No deformity   ASSESSMENT AND PLAN:    #. Sinus node dysfunction s/p pacemaker:  -In clinic device check was performed. Appropriate device function and stable lead parameters.  -Continue remote monitoring  #. Paroxysmal atrial fibrillation s/p ablation x2: Last ablation 01/2022 was RF PVI only. #. Atrial flutter: Likely atypical. Very symptomatic. Associated with CHF.  #. Secondary hypercoagulable state due to atrial fibrillation: CHADSVASC score of 4.  - Eliquis  5mg  BID.  -Continue nebivolol  5mg  daily.  -Continue diltiazem  180mg  daily. -Discussed treatment options today for AF and AFL including antiarrhythmic drug therapy and ablation.He did not feel Tikosyn  was effective. 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 Discussed risks, recovery and likelihood of success with each treatment strategy. Risk, benefits, and alternatives to EP study and ablation for afib were discussed. These risks include but are not limited to stroke, bleeding, vascular damage, tamponade, perforation, damage to the esophagus, lungs, phrenic nerve and other structures, pulmonary vein stenosis, worsening renal function, coronary vasospasm and death.  Discussed potential need for repeat ablation procedures and antiarrhythmic drugs after an initial ablation. The patient understands these risk and wishes to proceed.  We will therefore proceed with catheter ablation at the next available time.  Carto, ICE, anesthesia are requested for the procedure.  Will also obtain CT PV protocol prior to the procedure to exclude LAA thrombus and further evaluate atrial anatomy.  Follow up with Dr. Daneil Dunker 3 months after ablation.   Signed, Ardeen Kohler, MD

## 2024-01-31 NOTE — Telephone Encounter (Signed)
 CT report faxed to [facility] at number provided.

## 2024-01-31 NOTE — Telephone Encounter (Signed)
 Erin with [facility] is calling to have the report from the CT abdomen done on 4/30 sent via fax to  [phone]

## 2024-02-01 ENCOUNTER — Ambulatory Visit: Attending: Cardiology | Admitting: Cardiology

## 2024-02-01 ENCOUNTER — Other Ambulatory Visit: Payer: Self-pay

## 2024-02-01 ENCOUNTER — Encounter: Payer: Self-pay | Admitting: Cardiology

## 2024-02-01 VITALS — BP 110/66 | HR 66 | Ht 75.0 in | Wt 205.0 lb

## 2024-02-01 DIAGNOSIS — D6869 Other thrombophilia: Secondary | ICD-10-CM | POA: Diagnosis not present

## 2024-02-01 DIAGNOSIS — I480 Paroxysmal atrial fibrillation: Secondary | ICD-10-CM | POA: Diagnosis not present

## 2024-02-01 DIAGNOSIS — Z950 Presence of cardiac pacemaker: Secondary | ICD-10-CM

## 2024-02-01 DIAGNOSIS — I4892 Unspecified atrial flutter: Secondary | ICD-10-CM

## 2024-02-01 DIAGNOSIS — I495 Sick sinus syndrome: Secondary | ICD-10-CM

## 2024-02-01 LAB — CUP PACEART INCLINIC DEVICE CHECK
Date Time Interrogation Session: 20250523113300
Implantable Lead Connection Status: 753985
Implantable Lead Connection Status: 753985
Implantable Lead Implant Date: 19980115
Implantable Lead Implant Date: 19980115
Implantable Lead Location: 753859
Implantable Lead Location: 753860
Implantable Pulse Generator Implant Date: 20131007
Pulse Gen Serial Number: 66339555

## 2024-02-01 NOTE — Patient Instructions (Signed)
 Medication Instructions:  Your physician recommends that you continue on your current medications as directed. Please refer to the Current Medication list given to you today.  *If you need a refill on your cardiac medications before your next appointment, please call your pharmacy*  Lab Work: BMET and CBC - you may go to any [facility] location to have these drawn within 30 days of your procedure  Testing/Procedures: Cardiac CT Your physician has requested that you have cardiac CT. Cardiac computed tomography (CT) is a painless test that uses an x-ray machine to take clear, detailed pictures of your heart. For further information please visit [email or web address] Please follow instruction sheet as given. We will call you to schedule your CT scan. It will be done about three weeks prior to your ablation.  Ablation Your physician has recommended that you have an ablation. Catheter ablation is a medical procedure used to treat some cardiac arrhythmias (irregular heartbeats). During catheter ablation, a long, thin, flexible tube is put into a blood vessel in your groin (upper thigh), or neck. This tube is called an ablation catheter. It is then guided to your heart through the blood vessel. Radio frequency waves destroy small areas of heart tissue where abnormal heartbeats may cause an arrhythmia to start. Please see the instruction sheet given to you today.  Follow-Up: At [facility], you and your health needs are our priority.  As part of our continuing mission to provide you with exceptional heart care, we have created designated Provider Care Teams.  These Care Teams include your primary Cardiologist (physician) and Advanced Practice Providers (APPs -  Physician Assistants and Nurse Practitioners) who all work together to provide you with the care you need, when you need it.   Your next appointment:   We will contact you about your post-procedure follow up appointments.

## 2024-02-06 ENCOUNTER — Ambulatory Visit: Payer: Self-pay | Admitting: Cardiology

## 2024-02-06 DIAGNOSIS — L821 Other seborrheic keratosis: Secondary | ICD-10-CM | POA: Diagnosis not present

## 2024-02-06 DIAGNOSIS — T07XXXA Unspecified multiple injuries, initial encounter: Secondary | ICD-10-CM | POA: Diagnosis not present

## 2024-02-11 DIAGNOSIS — I480 Paroxysmal atrial fibrillation: Secondary | ICD-10-CM | POA: Diagnosis not present

## 2024-02-11 DIAGNOSIS — I4892 Unspecified atrial flutter: Secondary | ICD-10-CM | POA: Diagnosis not present

## 2024-02-12 ENCOUNTER — Ambulatory Visit: Admitting: Pulmonary Disease

## 2024-02-12 LAB — CBC
Hematocrit: 41.7 % (ref 37.5–51.0)
Hemoglobin: 13.6 g/dL (ref 13.0–17.7)
MCH: 32.9 pg (ref 26.6–33.0)
MCHC: 32.6 g/dL (ref 31.5–35.7)
MCV: 101 fL — ABNORMAL HIGH (ref 79–97)
Platelets: 166 10*3/uL (ref 150–450)
RBC: 4.14 x10E6/uL (ref 4.14–5.80)
RDW: 12.8 % (ref 11.6–15.4)
WBC: 7 10*3/uL (ref 3.4–10.8)

## 2024-02-12 LAB — BASIC METABOLIC PANEL WITH GFR
BUN/Creatinine Ratio: 17 (ref 10–24)
BUN: 18 mg/dL (ref 8–27)
CO2: 23 mmol/L (ref 20–29)
Calcium: 9.7 mg/dL (ref 8.6–10.2)
Chloride: 107 mmol/L — ABNORMAL HIGH (ref 96–106)
Creatinine, Ser: 1.06 mg/dL (ref 0.76–1.27)
Glucose: 106 mg/dL — ABNORMAL HIGH (ref 70–99)
Potassium: 4.4 mmol/L (ref 3.5–5.2)
Sodium: 143 mmol/L (ref 134–144)
eGFR: 69 mL/min/{1.73_m2} (ref >59)

## 2024-02-22 ENCOUNTER — Telehealth: Payer: Self-pay

## 2024-02-22 NOTE — Telephone Encounter (Signed)
 Patient is on Dr. Orinda Birkenhead wait list.

## 2024-02-22 NOTE — Telephone Encounter (Signed)
 Spoke with patient to complete pre-procedure call.     New medical conditions?  NO Recent hospitalizations or surgeries? NO Started any new medications? NO Patient made aware to contact office to inform of any new medications started. Any changes in activities of daily living? NO  Pre-procedure testing scheduled: CT on Tuesday, June 17th and lab work was done June 2nd, but unfortunately it is just outside the 30 day window. Patient is aware that he will have to repeat bloodwork.  Confirmed patient is taking Eliquis  5mg  twice daily and will continue taking medication before procedure or it may need to be rescheduled.  Confirmed patient is scheduled for Atrial Fibrillation Ablation and Aflutter Ablation on Tuesday, July 8 with Dr. Clinton Danas. Instructed patient to arrive at the Main Entrance A at [facility]: [address] [place], [place] [address] and check in at Admitting at 8 AM.  Advised of plan to go home the same day and will only stay overnight if medically necessary. You MUST have a responsible adult to drive you home and MUST be with you the first 24 hours after you arrive home or your procedure could be cancelled.  Patient verbalized understanding to information provided and is agreeable to proceed with procedure.    Patient asked if there any cancellations or sooner openings, please advise.

## 2024-02-26 ENCOUNTER — Ambulatory Visit (HOSPITAL_COMMUNITY)
Admission: RE | Admit: 2024-02-26 | Discharge: 2024-02-26 | Disposition: A | Discharge status: Home / Self Care | Source: Ambulatory Visit | Attending: Cardiology | Admitting: Cardiology

## 2024-02-26 DIAGNOSIS — I480 Paroxysmal atrial fibrillation: Secondary | ICD-10-CM | POA: Insufficient documentation

## 2024-02-26 DIAGNOSIS — I4892 Unspecified atrial flutter: Secondary | ICD-10-CM | POA: Diagnosis not present

## 2024-02-26 DIAGNOSIS — Z955 Presence of coronary angioplasty implant and graft: Secondary | ICD-10-CM | POA: Insufficient documentation

## 2024-02-26 MED ORDER — IOHEXOL 350 MG/ML SOLN
100.0000 mL | Freq: Once | INTRAVENOUS | Status: AC | PRN
  Administered 2024-02-26: 100 mL via INTRAVENOUS

## 2024-03-07 ENCOUNTER — Telehealth (HOSPITAL_COMMUNITY): Payer: Self-pay

## 2024-03-07 DIAGNOSIS — I480 Paroxysmal atrial fibrillation: Secondary | ICD-10-CM

## 2024-03-07 DIAGNOSIS — Z01812 Encounter for preprocedural laboratory examination: Secondary | ICD-10-CM

## 2024-03-07 NOTE — Telephone Encounter (Signed)
 Attempted to reach patient to discuss upcoming procedure, no answer. Left VM for patient to return call.  Noted patient completed pre-procedure labs on June 2, which will be greater than 30 days prior to procedure. New lab orders placed/released.

## 2024-03-10 ENCOUNTER — Other Ambulatory Visit: Payer: Self-pay

## 2024-03-10 DIAGNOSIS — I480 Paroxysmal atrial fibrillation: Secondary | ICD-10-CM | POA: Diagnosis not present

## 2024-03-10 DIAGNOSIS — Z01812 Encounter for preprocedural laboratory examination: Secondary | ICD-10-CM | POA: Diagnosis not present

## 2024-03-10 LAB — CBC
Hematocrit: 43.8 % (ref 37.5–51.0)
Hemoglobin: 14.1 g/dL (ref 13.0–17.7)
MCH: 32.3 pg (ref 26.6–33.0)
MCHC: 32.2 g/dL (ref 31.5–35.7)
MCV: 101 fL — ABNORMAL HIGH (ref 79–97)
Platelets: 140 10*3/uL — ABNORMAL LOW (ref 150–450)
RBC: 4.36 x10E6/uL (ref 4.14–5.80)
RDW: 12.8 % (ref 11.6–15.4)
WBC: 5.8 10*3/uL (ref 3.4–10.8)

## 2024-03-10 NOTE — Telephone Encounter (Signed)
 Patient returned call to discuss upcoming procedure.   CT: completed.  Labs: plans to complete today  Any recent signs of acute illness or been started on antibiotics? No Any new medications started? No Any medications to hold? No Any missed doses of blood thinner? No Advised patient to continue taking ANTICOAGULANT: Eliquis  (Apixaban ) twice daily without missing any doses.  Medication instructions:  On the morning of your procedure DO NOT take any medication., including Eliquis  or the procedure may be rescheduled. Nothing to eat or drink after midnight prior to your procedure.  Confirmed patient is scheduled for Atrial Fibrillation Ablation, Atrial Flutter Ablation on Tuesday, July 8 with Dr. Sidra Kitty. Instructed patient to arrive at the Main Entrance A at [facility]: [address] [place], [place] [address] and check in at Admitting at 8:00 AM.  Advised of plan to go home the same day and will only stay overnight if medically necessary. You MUST have a responsible adult to drive you home and MUST be with you the first 24 hours after you arrive home or your procedure could be cancelled.  Patient verbalized understanding to all instructions provided and agreed to proceed with procedure.

## 2024-03-11 LAB — BASIC METABOLIC PANEL WITH GFR
BUN/Creatinine Ratio: 18 (ref 10–24)
BUN: 17 mg/dL (ref 8–27)
CO2: 24 mmol/L (ref 20–29)
Calcium: 9.9 mg/dL (ref 8.6–10.2)
Chloride: 104 mmol/L (ref 96–106)
Creatinine, Ser: 0.94 mg/dL (ref 0.76–1.27)
Glucose: 77 mg/dL (ref 70–99)
Potassium: 4.7 mmol/L (ref 3.5–5.2)
Sodium: 142 mmol/L (ref 134–144)
eGFR: 80 mL/min/{1.73_m2} (ref >59)

## 2024-03-12 ENCOUNTER — Ambulatory Visit: Payer: Self-pay | Admitting: Cardiology

## 2024-03-12 ENCOUNTER — Encounter: Payer: Self-pay | Admitting: Emergency Medicine

## 2024-03-17 NOTE — Pre-Procedure Instructions (Signed)
 Attempted to call patient regarding procedure instructions.  Left voicemail on the following items: Arrival time 0800 Nothing to eat or drink after midnight No meds AM of procedure Responsible person to drive you home and stay with you for 24 hrs  Have you missed any doses of anti-coagulant Eliquis - should be taken twice a day, if you have missed any doses please let us  know.

## 2024-03-18 ENCOUNTER — Encounter (HOSPITAL_COMMUNITY)
Admission: RE | Disposition: A | Discharge status: Home / Self Care | Payer: Self-pay | Source: Ambulatory Visit | Attending: Cardiology

## 2024-03-18 ENCOUNTER — Ambulatory Visit (HOSPITAL_COMMUNITY)

## 2024-03-18 ENCOUNTER — Other Ambulatory Visit: Payer: Self-pay

## 2024-03-18 ENCOUNTER — Ambulatory Visit (HOSPITAL_COMMUNITY)
Admission: RE | Admit: 2024-03-18 | Discharge: 2024-03-18 | Disposition: A | Discharge status: Home / Self Care | Source: Ambulatory Visit | Attending: Cardiology | Admitting: Cardiology

## 2024-03-18 ENCOUNTER — Encounter (HOSPITAL_COMMUNITY): Payer: Self-pay | Admitting: Cardiology

## 2024-03-18 DIAGNOSIS — I509 Heart failure, unspecified: Secondary | ICD-10-CM | POA: Diagnosis not present

## 2024-03-18 DIAGNOSIS — Z7901 Long term (current) use of anticoagulants: Secondary | ICD-10-CM | POA: Insufficient documentation

## 2024-03-18 DIAGNOSIS — R000 Tachycardia, unspecified: Secondary | ICD-10-CM

## 2024-03-18 DIAGNOSIS — I2510 Atherosclerotic heart disease of native coronary artery without angina pectoris: Secondary | ICD-10-CM

## 2024-03-18 DIAGNOSIS — I4819 Other persistent atrial fibrillation: Secondary | ICD-10-CM

## 2024-03-18 DIAGNOSIS — G4730 Sleep apnea, unspecified: Secondary | ICD-10-CM | POA: Insufficient documentation

## 2024-03-18 DIAGNOSIS — Z79899 Other long term (current) drug therapy: Secondary | ICD-10-CM | POA: Insufficient documentation

## 2024-03-18 DIAGNOSIS — I130 Hypertensive heart and chronic kidney disease with heart failure and stage 1 through stage 4 chronic kidney disease, or unspecified chronic kidney disease: Secondary | ICD-10-CM | POA: Diagnosis not present

## 2024-03-18 DIAGNOSIS — I4892 Unspecified atrial flutter: Secondary | ICD-10-CM

## 2024-03-18 DIAGNOSIS — I5042 Chronic combined systolic (congestive) and diastolic (congestive) heart failure: Secondary | ICD-10-CM | POA: Insufficient documentation

## 2024-03-18 DIAGNOSIS — Z950 Presence of cardiac pacemaker: Secondary | ICD-10-CM | POA: Diagnosis not present

## 2024-03-18 DIAGNOSIS — E1122 Type 2 diabetes mellitus with diabetic chronic kidney disease: Secondary | ICD-10-CM | POA: Insufficient documentation

## 2024-03-18 DIAGNOSIS — N183 Chronic kidney disease, stage 3 unspecified: Secondary | ICD-10-CM | POA: Insufficient documentation

## 2024-03-18 DIAGNOSIS — D6869 Other thrombophilia: Secondary | ICD-10-CM | POA: Insufficient documentation

## 2024-03-18 DIAGNOSIS — I480 Paroxysmal atrial fibrillation: Secondary | ICD-10-CM | POA: Diagnosis present

## 2024-03-18 LAB — GLUCOSE, CAPILLARY: Glucose-Capillary: 132 mg/dL — ABNORMAL HIGH (ref 70–99)

## 2024-03-18 LAB — POCT ACTIVATED CLOTTING TIME
Activated Clotting Time: 268 s
Activated Clotting Time: 302 s

## 2024-03-18 MED ORDER — SODIUM CHLORIDE 0.9% FLUSH: 3.0000 mL | Freq: Two times a day (BID) | INTRAVENOUS | Status: DC

## 2024-03-18 MED ORDER — CEFAZOLIN SODIUM-DEXTROSE 2-4 GM/100ML-% IV SOLN
INTRAVENOUS | Status: AC
  Filled 2024-03-18: qty 100

## 2024-03-18 MED ORDER — ACETAMINOPHEN 325 MG PO TABS: 650.0000 mg | ORAL_TABLET | ORAL | Status: DC | PRN

## 2024-03-18 MED ORDER — FENTANYL CITRATE (PF) 100 MCG/2ML IJ SOLN
INTRAMUSCULAR | Status: AC
  Filled 2024-03-18: qty 2

## 2024-03-18 MED ORDER — PROTAMINE SULFATE 10 MG/ML IV SOLN
INTRAVENOUS | Status: DC | PRN
  Administered 2024-03-18: 5 mg via INTRAVENOUS
  Administered 2024-03-18: 10 mg via INTRAVENOUS
  Administered 2024-03-18 (×2): 5 mg via INTRAVENOUS
  Administered 2024-03-18: 10 mg via INTRAVENOUS

## 2024-03-18 MED ORDER — SODIUM CHLORIDE 0.9 % IV SOLN: 250.0000 mL | INTRAVENOUS | Status: DC | PRN

## 2024-03-18 MED ORDER — APIXABAN 5 MG PO TABS
5.0000 mg | ORAL_TABLET | Freq: Once | ORAL | Status: AC
  Administered 2024-03-18: 5 mg via ORAL
  Filled 2024-03-18: qty 1

## 2024-03-18 MED ORDER — CEFAZOLIN SODIUM-DEXTROSE 2-4 GM/100ML-% IV SOLN
2.0000 g | Freq: Once | INTRAVENOUS | Status: AC
  Administered 2024-03-18: 2 g via INTRAVENOUS

## 2024-03-18 MED ORDER — ONDANSETRON HCL 4 MG/2ML IJ SOLN: 4.0000 mg | Freq: Four times a day (QID) | INTRAMUSCULAR | Status: DC | PRN

## 2024-03-18 MED ORDER — HEPARIN (PORCINE) IN NACL 1000-0.9 UT/500ML-% IV SOLN
INTRAVENOUS | Status: DC | PRN
  Administered 2024-03-18 (×3): 500 mL

## 2024-03-18 MED ORDER — HEPARIN SODIUM (PORCINE) 1000 UNIT/ML IJ SOLN
INTRAMUSCULAR | Status: AC
  Filled 2024-03-18: qty 10

## 2024-03-18 MED ORDER — ROCURONIUM BROMIDE 10 MG/ML (PF) SYRINGE
PREFILLED_SYRINGE | INTRAVENOUS | Status: DC | PRN
  Administered 2024-03-18: 15 mg via INTRAVENOUS
  Administered 2024-03-18: 20 mg via INTRAVENOUS
  Administered 2024-03-18: 15 mg via INTRAVENOUS
  Administered 2024-03-18: 50 mg via INTRAVENOUS

## 2024-03-18 MED ORDER — HEPARIN SODIUM (PORCINE) 1000 UNIT/ML IJ SOLN
INTRAMUSCULAR | Status: DC | PRN
  Administered 2024-03-18: 5000 [IU] via INTRAVENOUS
  Administered 2024-03-18: 1000 [IU] via INTRAVENOUS
  Administered 2024-03-18: 14000 [IU] via INTRAVENOUS
  Administered 2024-03-18: 1000 [IU] via INTRAVENOUS

## 2024-03-18 MED ORDER — SODIUM CHLORIDE 0.9 % IV SOLN: INTRAVENOUS | Status: DC

## 2024-03-18 MED ORDER — PHENYLEPHRINE HCL-NACL 20-0.9 MG/250ML-% IV SOLN
INTRAVENOUS | Status: DC | PRN
  Administered 2024-03-18: 15 ug/min via INTRAVENOUS
  Administered 2024-03-18: 80 ug via INTRAVENOUS

## 2024-03-18 MED ORDER — FENTANYL CITRATE (PF) 250 MCG/5ML IJ SOLN
INTRAMUSCULAR | Status: DC | PRN
  Administered 2024-03-18: 50 ug via INTRAVENOUS
  Administered 2024-03-18 (×2): 25 ug via INTRAVENOUS

## 2024-03-18 MED ORDER — SUGAMMADEX SODIUM 200 MG/2ML IV SOLN
INTRAVENOUS | Status: DC | PRN
  Administered 2024-03-18: 200 mg via INTRAVENOUS

## 2024-03-18 MED ORDER — DEXAMETHASONE SODIUM PHOSPHATE 10 MG/ML IJ SOLN
INTRAMUSCULAR | Status: DC | PRN
  Administered 2024-03-18: 10 mg via INTRAVENOUS

## 2024-03-18 MED ORDER — ONDANSETRON HCL 4 MG/2ML IJ SOLN
INTRAMUSCULAR | Status: DC | PRN
  Administered 2024-03-18: 4 mg via INTRAVENOUS

## 2024-03-18 MED ORDER — PROPOFOL 10 MG/ML IV BOLUS
INTRAVENOUS | Status: DC | PRN
Start: 2024-03-18 — End: 2024-03-18
  Administered 2024-03-18: 120 mg via INTRAVENOUS

## 2024-03-18 MED ORDER — SODIUM CHLORIDE 0.9% FLUSH: 3.0000 mL | INTRAVENOUS | Status: DC | PRN

## 2024-03-18 NOTE — Anesthesia Preprocedure Evaluation (Signed)
 Anesthesia Evaluation  Patient identified by MRN, date of birth, ID band Patient awake    Reviewed: Allergy & Precautions, H&P , NPO status , Patient's Chart, lab work & pertinent test results  Airway Mallampati: II   Neck ROM: full    Dental   Pulmonary sleep apnea    breath sounds clear to auscultation       Cardiovascular + CAD, + Cardiac Stents, + Peripheral Vascular Disease and +CHF  + dysrhythmias Atrial Fibrillation + pacemaker  Rhythm:irregular Rate:Normal     Neuro/Psych  PSYCHIATRIC DISORDERS  Depression       GI/Hepatic   Endo/Other    Renal/GU Renal InsufficiencyRenal disease     Musculoskeletal  (+) Arthritis ,    Abdominal   Peds  Hematology   Anesthesia Other Findings   Reproductive/Obstetrics                              Anesthesia Physical Anesthesia Plan  ASA: 3  Anesthesia Plan: General   Post-op Pain Management:    Induction: Intravenous  PONV Risk Score and Plan: 2 and Ondansetron , Dexamethasone  and Treatment may vary due to age or medical condition  Airway Management Planned: Oral ETT  Additional Equipment:   Intra-op Plan:   Post-operative Plan: Extubation in OR  Informed Consent: I have reviewed the patients History and Physical, chart, labs and discussed the procedure including the risks, benefits and alternatives for the proposed anesthesia with the patient or authorized representative who has indicated his/her understanding and acceptance.     Dental advisory given  Plan Discussed with: CRNA, Anesthesiologist and Surgeon  Anesthesia Plan Comments:         Anesthesia Quick Evaluation

## 2024-03-18 NOTE — Discharge Instructions (Signed)

## 2024-03-18 NOTE — Transfer of Care (Signed)
 Immediate Anesthesia Transfer of Care Note  Patient: [name]  Procedure(s) Performed: ATRIAL FIBRILLATION ABLATION A-FLUTTER ABLATION  Patient Location: Cath Lab  Anesthesia Type:General  Level of Consciousness: awake, alert , and oriented  Airway & Oxygen Therapy: Patient Spontanous Breathing  Post-op Assessment: Report given to RN and Post -op Vital signs reviewed and stable  Post vital signs: Reviewed and stable  Last Vitals:  Vitals Value Taken Time  BP 110/64 03/18/24 14:25  Temp 37.1 C 03/18/24 14:05  Pulse 76 03/18/24 14:26  Resp 7 03/18/24 14:26  SpO2 96 % 03/18/24 14:26  Vitals shown include unfiled device data.  Last Pain:  Vitals:   03/18/24 1405  TempSrc: Oral  PainSc: 0-No pain         Complications: No notable events documented.

## 2024-03-18 NOTE — Anesthesia Procedure Notes (Signed)
 Procedure Name: Intubation Date/Time: 03/18/2024 9:52 AM  Performed by: Zelphia Norleen HERO, CRNAPre-anesthesia Checklist: Emergency Drugs available, Patient identified, Suction available, Patient being monitored and Timeout performed Patient Re-evaluated:Patient Re-evaluated prior to induction Oxygen Delivery Method: Circle system utilized Preoxygenation: Pre-oxygenation with 100% oxygen Induction Type: IV induction Ventilation: Oral airway inserted - appropriate to patient size and Mask ventilation without difficulty Laryngoscope Size: Mac and 4 Grade View: Grade I Tube type: Oral Tube size: 7.0 mm Number of attempts: 1 Airway Equipment and Method: Stylet Placement Confirmation: ETT inserted through vocal cords under direct vision, positive ETCO2 and breath sounds checked- equal and bilateral Secured at: 24 cm Tube secured with: Tape Dental Injury: Teeth and Oropharynx as per pre-operative assessment

## 2024-03-18 NOTE — H&P (Signed)
 Electrophysiology Note:   Date:  03/18/24  ID:  Vevelyn ONEIDA [name], DOB [birth year], MRN [id number]   Primary Cardiologist: [name] ONEIDA Decent, MD Electrophysiologist: Elspeth Sage, MD       History of Present Illness:   [name] is a 83 y.o. male with h/o SND s/p PPM, atrial fibrillation s/p PVI 01/2022, AFL, LBBB, chronic combined systolic & diastolic CHF, CKD III who is being seen today for evaluation of his atrial flutter.   Discussed the use of AI scribe software for clinical note transcription with the patient, who gave verbal consent to proceed.   History of Present Illness He has a history of atrial fibrillation and has undergone two prior ablations. Recently, he was diagnosed with atrial flutter and underwent cardioversion on  September 17, 2023. He has had a total of three cardioversions, including one during a hospital stay for diverticulitis about a year ago, although he was not informed of the reason for the cardioversion at that time.   He describes the symptoms of atrial flutter as 'absolutely horrible,' causing significant fatigue, cognitive difficulties, shortness of breath, urinary retention, constipation, bloating, and a weight gain of approximately ten pounds, which he attributes to fluid retention. His recovery from the recent cardioversion has been slow, with energy levels gradually improving to about 80-90% of normal. He experiences fatigue, particularly in the late afternoon.   He has been on dofetilide  for about fifteen years, which he feels was ineffective. He previously tried amiodarone but developed numbness in his fingers and toes, leading to its discontinuation. He has not experienced atrial fibrillation for nearly a year since his last ablation, but the atrial flutter has persisted.   He wants to address the atrial flutter, noting that it significantly impacts his quality of life, stating that it 'knocks the tar out of you.'   Interval: Patient reports for planned  catheter ablation. Reports feeling relatively well. Has been exercising regularly at the gym. No new or acute complaints today.   Review of systems complete and found to be negative unless listed in HPI.    EP Information / Studies Reviewed:     EKG is not ordered today. EKG from 01/27/24 reviewed which showed AP with PVCs.      EKG 01/27/24:    Echo 07/03/23:   1. Left ventricular ejection fraction, by estimation, is 40 to 45%. Left  ventricular ejection fraction by 3D volume is 42 %. The left ventricle has  mildly decreased function. The left ventricle demonstrates regional wall  motion abnormalities (see scoring diagram/findings for description). There is severe concentric left ventricular hypertrophy. Left ventricular diastolic parameters are indeterminate.   2. Right ventricular systolic function is normal. The right ventricular  size is normal. There is normal pulmonary artery systolic pressure.   3. Left atrial size was mildly dilated.   4. The mitral valve is abnormal. Trivial mitral valve regurgitation.   5. In the setting of decreased stroke volume index and calcified valve,  mild low flow low gradient aortic stenosis is suspected. The aortic valve  is tricuspid. There is mild calcification of the aortic valve. There is  mild thickening of the aortic valve. Aortic valve regurgitation is not visualized. Mild aortic valve stenosis. Aortic valve Vmax measures 2.19 m/s.   6. The inferior vena cava is normal in size with greater than 50%  respiratory variability, suggesting right atrial pressure of 3 mmHg.      Risk Assessment/Calculations:     CHA2DS2-VASc Score =  4   This indicates a 4.8% annual risk of stroke. The patient's score is based upon: CHF History: 1 HTN History: 0 Diabetes History: 0 Stroke History: 0 Vascular Disease History: 1 Age Score: 2 Gender Score: 0           Physical Exam:    Today's Vitals   03/18/24 0855  BP: 131/68  Pulse: 67  Resp: 18   Temp: 97.8 F (36.6 C)  TempSrc: Oral  SpO2: 100%  Weight: 83 kg  Height: 6' 3 (1.905 m)   Body mass index is 22.87 kg/m.  GEN: Well nourished, well developed in no acute distress NECK: No JVD CARDIAC: Normal rate, regular rhythm RESPIRATORY:  Clear to auscultation without rales, wheezing or rhonchi  ABDOMEN: Soft, non-distended EXTREMITIES:  No edema; No deformity    ASSESSMENT AND PLAN:      #. Paroxysmal atrial fibrillation s/p ablation x2: Last ablation 01/2022 was RF PVI only. #. Atrial flutter: Likely atypical. Very symptomatic. Associated with CHF.  #. Secondary hypercoagulable state due to atrial fibrillation: CHADSVASC score of 4.  - Eliquis  5mg  BID.  -Continue nebivolol  5mg  daily.  -Continue diltiazem  180mg  daily. -Discussed treatment options today for AF and AFL including antiarrhythmic drug therapy and ablation.He did not feel Tikosyn  was effective. 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 Discussed risks, recovery and likelihood of success with each treatment strategy. Risk, benefits, and alternatives to EP study and ablation for afib were discussed. These risks include but are not limited to stroke, bleeding, vascular damage, tamponade, perforation, damage to the esophagus, lungs, phrenic nerve and other structures, pulmonary vein stenosis, worsening renal function, coronary vasospasm and death.  Discussed potential need for repeat ablation procedures and antiarrhythmic drugs after an initial ablation. The patient understands these risk and wishes to proceed today.   Follow up with Dr. Kennyth 3 months after ablation.    Signed, Fonda Kennyth, MD

## 2024-03-19 ENCOUNTER — Telehealth (HOSPITAL_COMMUNITY): Payer: Self-pay

## 2024-03-19 NOTE — Anesthesia Postprocedure Evaluation (Signed)
 Anesthesia Post Note  Patient: [name]  Procedure(s) Performed: ATRIAL FIBRILLATION ABLATION A-FLUTTER ABLATION     Patient location during evaluation: Cath Lab Anesthesia Type: General Level of consciousness: awake and alert Pain management: pain level controlled Vital Signs Assessment: post-procedure vital signs reviewed and stable Respiratory status: spontaneous breathing, nonlabored ventilation, respiratory function stable and patient connected to nasal cannula oxygen Cardiovascular status: blood pressure returned to baseline and stable Postop Assessment: no apparent nausea or vomiting Anesthetic complications: no   No notable events documented.  Last Vitals:  Vitals:   03/18/24 1645 03/18/24 1700  BP:    Pulse: 72 74  Resp: (!) 22 15  Temp:    SpO2: 92% 99%    Last Pain:  Vitals:   03/18/24 1440  TempSrc:   PainSc: 0-No pain                 Micheline Markes S

## 2024-03-19 NOTE — Telephone Encounter (Signed)
 Spoke with patient to complete post procedure follow up call.  Patient reports no complications with groin sites.   Instructions reviewed with patient:  Remove large bandage at puncture site after 24 hours. It is normal to have bruising, tenderness, mild swelling, and a pea or marble sized lump/knot at the groin site which can take up to three months to resolve.  Get help right away if you notice sudden swelling at the puncture site.  Check your puncture site every day for signs of infection: fever, redness, swelling, pus drainage, warmth, foul odor or excessive pain. If this occurs, please call the office at [phone], to speak with the nurse. Get help right away if your puncture site is bleeding and the bleeding does not stop after applying firm pressure to the area.  You may continue to have skipped beats/ atrial fibrillation during the first several months after your procedure.  It is very important not to miss any doses of your blood thinner Eliquis .    You will follow up with the Afib clinic on 04/18/24.   Patient verbalized understanding to all instructions provided.

## 2024-03-21 MED FILL — Fentanyl Citrate Preservative Free (PF) Inj 100 MCG/2ML: INTRAMUSCULAR | Qty: 2 | Status: AC

## 2024-03-24 ENCOUNTER — Other Ambulatory Visit (INDEPENDENT_AMBULATORY_CARE_PROVIDER_SITE_OTHER)

## 2024-03-24 ENCOUNTER — Telehealth: Payer: Self-pay

## 2024-03-24 ENCOUNTER — Ambulatory Visit: Admitting: Internal Medicine

## 2024-03-24 ENCOUNTER — Encounter: Payer: Self-pay | Admitting: Internal Medicine

## 2024-03-24 VITALS — BP 90/60 | HR 80 | Ht 73.5 in | Wt 189.5 lb

## 2024-03-24 DIAGNOSIS — D696 Thrombocytopenia, unspecified: Secondary | ICD-10-CM | POA: Diagnosis not present

## 2024-03-24 DIAGNOSIS — Z860100 Personal history of colon polyps, unspecified: Secondary | ICD-10-CM

## 2024-03-24 DIAGNOSIS — K5732 Diverticulitis of large intestine without perforation or abscess without bleeding: Secondary | ICD-10-CM | POA: Diagnosis not present

## 2024-03-24 DIAGNOSIS — K7460 Unspecified cirrhosis of liver: Secondary | ICD-10-CM | POA: Diagnosis not present

## 2024-03-24 DIAGNOSIS — Z860101 Personal history of adenomatous and serrated colon polyps: Secondary | ICD-10-CM

## 2024-03-24 DIAGNOSIS — Z8619 Personal history of other infectious and parasitic diseases: Secondary | ICD-10-CM

## 2024-03-24 LAB — HEPATIC FUNCTION PANEL
ALT: 25 U/L (ref 0–53)
AST: 25 U/L (ref 0–37)
Albumin: 4.2 g/dL (ref 3.5–5.2)
Alkaline Phosphatase: 63 U/L (ref 39–117)
Bilirubin, Direct: 0.2 mg/dL (ref 0.0–0.3)
Total Bilirubin: 0.6 mg/dL (ref 0.2–1.2)
Total Protein: 7.3 g/dL (ref 6.0–8.3)

## 2024-03-24 LAB — IBC + FERRITIN
Ferritin: 165.3 ng/mL (ref 22.0–322.0)
Iron: 27 ug/dL — ABNORMAL LOW (ref 42–165)
Saturation Ratios: 10.8 % — ABNORMAL LOW (ref 20.0–50.0)
TIBC: 250.6 ug/dL (ref 250.0–450.0)
Transferrin: 179 mg/dL — ABNORMAL LOW (ref 212.0–360.0)

## 2024-03-24 LAB — PROTIME-INR
INR: 1.5 ratio — ABNORMAL HIGH (ref 0.8–1.0)
Prothrombin Time: 15.8 s — ABNORMAL HIGH (ref 9.6–13.1)

## 2024-03-24 MED ORDER — NA SULFATE-K SULFATE-MG SULF 17.5-3.13-1.6 GM/177ML PO SOLN
ORAL | 0 refills | Status: DC
Start: 2024-03-24 — End: 2024-07-03

## 2024-03-24 NOTE — Patient Instructions (Signed)
 Your provider has requested that you go to the basement level for lab work before leaving today. Press B on the elevator. The lab is located at the first door on the left as you exit the elevator.  You have been scheduled for an abdominal ultrasound at [facility] (1st floor of hospital) on 06/24/24 at 9 am. Please arrive 30 minutes prior to your appointment for registration. Make certain not to have anything to eat or drink 6 hours prior to your appointment. Should you need to reschedule your appointment, please contact radiology at [phone]. This test typically takes about 30 minutes to perform.  You have been scheduled for an endoscopy and colonoscopy. Please follow the written instructions given to you at your visit today.  If you use inhalers (even only as needed), please bring them with you on the day of your procedure.  DO NOT TAKE 7 DAYS PRIOR TO TEST- Trulicity (dulaglutide) Ozempic, Wegovy (semaglutide) Mounjaro (tirzepatide) Bydureon Bcise (exanatide extended release)  DO NOT TAKE 1 DAY PRIOR TO YOUR TEST Rybelsus (semaglutide) Adlyxin (lixisenatide) Victoza (liraglutide) Byetta (exanatide) ___________________________________________________________________________  We have sent the following medications to your pharmacy for you to pick up at your convenience: Suprep  _______________________________________________________  If your blood pressure at your visit was 140/90 or greater, please contact your primary care physician to follow up on this.  _______________________________________________________  If you are age 54 or older, your body mass index should be between 23-30. Your Body mass index is 24.66 kg/m. If this is out of the aforementioned range listed, please consider follow up with your Primary Care Provider.  If you are age 57 or younger, your body mass index should be between 19-25. Your Body mass index is 24.66 kg/m. If this is out of the  aformentioned range listed, please consider follow up with your Primary Care Provider.   ________________________________________________________  The [facility] providers would like to encourage you to use MYCHART to communicate with providers for non-urgent requests or questions.  Due to long hold times on the telephone, sending your provider a message by [facility] may be a faster and more efficient way to get a response.  Please allow 48 business hours for a response.  Please remember that this is for non-urgent requests.  _______________________________________________________  Due to recent changes in healthcare laws, you may see the results of your imaging and laboratory studies on MyChart before your provider has had a chance to review them.  We understand that in some cases there may be results that are confusing or concerning to you. Not all laboratory results come back in the same time frame and the provider may be waiting for multiple results in order to interpret others.  Please give us  48 hours in order for your provider to thoroughly review all the results before contacting the office for clarification of your results.   Thank you for entrusting me with your care and for choosing [facility], Dr. Estefana Kidney

## 2024-03-24 NOTE — Telephone Encounter (Signed)
 [facility] Pre-operative Risk Assessment     Request for surgical clearance:     Endoscopy Procedure  What type of surgery is being performed?     Colonoscopy and Endoscopy  When is this surgery scheduled?     04/17/24  What type of clearance is required ?   Pharmacy  Are there any medications that need to be held prior to surgery and how long? Eliquis  2 days hold  Practice name and name of physician performing surgery?      [facility]  What is your office phone and fax number?      Phone- [phone]  Fax- [phone]  Anesthesia type (None, local, MAC, general) ?       MAC   Please route your response to [facility]

## 2024-03-24 NOTE — Progress Notes (Addendum)
 Review of pertinent gastrointestinal problems: Recurrent diverticulitis. Had one episode in 12/2020 and another in 12/2023. 2. Precancerous colon polyps;  Colonoscopy July 2016 Dr. Teressa , done for routine screening found to small polyps one was a tubular adenoma and diverticulosis. Colonoscopy 07/2021 with 3 mm transverse colon polyp (hyperplastic polyp) that was removed and left sided colon diverticulosis, internal/external hemorrhoids 3. 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 Has thrombocytopenia 4. 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 Treated with vancomycin   HPI: This is a very pleasant 83 year old man with A-fib on Eliquis , recurrent diverticulitis, OSA on CPAP, CKD, CAD s/p PCI, and prior C dif infection presents for follow up of diverticulitis  Patient previously saw Dr. Dante with [facility] in 08/2021 and opted against surgery for his diverticulitis at that time.   He was diagnosed with diverticulitis in 12/2023 for which he was treated with a course of Augmentin .  Patient states that he does have a remote history of substantial alcohol use ranging from age 60-50s. He and his wife quit drinking in their 70s. He has not had any alcohol for over 20 years at this time. 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   Interval History: He had resolution in his diverticulitis after about 3 days of antibiotics therapy. Has not had a recurrence in his diverticulitis. Then this morning he had a strange thing happen. He had 4 BMs this morning. During the last BM, he did see some blood in the stools. He did recently have a cardiac ablation last week. Weight went down 4 lbs yesterday. Denies swelling in abdomen/legs or confusion. Denies melena. He is on Eliquis  typically.  Wt Readings from Last 3 Encounters:  03/24/24 189 lb 8 oz (86 kg)  03/18/24 183 lb (83 kg)  02/01/24 205 lb (93 kg)   Past Medical History:  Diagnosis Date    Allergic rhinitis    takes Allegra daily   Allergic rhinitis, cause unspecified 06/22/2012   Allergy    Arthritis    Atrial fibrillation (HCC)    takes Tikosyn  and Eliquis  daily   Bilateral popliteal artery aneurysm (HCC)    Chronic kidney disease (CKD), stage III (moderate) (HCC)    Clotting disorder (HCC)    Coronary artery disease    LAD stenting 2004 non-DES   Depression    took Zoloft 85yrs ago but nothing now   Diverticulosis    DVT (deep venous thrombosis) (HCC)    left popliteal artery greater than 10 years ago   Dysphagia    occasionally   History of blood clots 2003   left leg prior to fem pop   History of colon polyps    Hyperlipidemia    takes Tricor  and Lipitor daily   Insomnia    takes Ambien  nightly as needed   Joint pain    Joint swelling    Neuromuscular disorder (HCC)    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   OSA (obstructive sleep apnea)    CPAP   Pacemaker    Peripheral vascular disease (HCC)    Pleurisy    early 80's   Prostatitis    Sleep apnea    Urinary urgency     Past Surgical History:  Procedure Laterality Date   A-FLUTTER ABLATION N/A 03/18/2024   Procedure: A-FLUTTER ABLATION;  Surgeon: Kennyth Chew, MD;  Location: [facility] INVASIVE CV LAB;  Service: Cardiovascular;  Laterality: N/A;  ATRIAL FIBRILLATION ABLATION N/A 06/30/2021   Procedure: ATRIAL FIBRILLATION ABLATION;  Surgeon: Kelsie Agent, MD;  Location: [facility] INVASIVE CV LAB;  Service: Cardiovascular;  Laterality: N/A;   ATRIAL FIBRILLATION ABLATION N/A 01/09/2022   Procedure: ATRIAL FIBRILLATION ABLATION;  Surgeon: Kelsie Agent, MD;  Location: [facility] INVASIVE CV LAB;  Service: Cardiovascular;  Laterality: N/A;   ATRIAL FIBRILLATION ABLATION N/A 03/18/2024   Procedure: ATRIAL FIBRILLATION ABLATION;  Surgeon: Kennyth Chew, MD;  Location: [facility] INVASIVE CV LAB;  Service: Cardiovascular;  Laterality: N/A;   CARDIAC CATHETERIZATION  09/11/2002   with 2 stents   CARDIAC  CATHETERIZATION N/A 05/06/2015   Procedure: Left Heart Cath and Coronary Angiography;  Surgeon: Victory LELON Sharps, MD;  Location: [facility] INVASIVE CV LAB;  Service: Cardiovascular;  Laterality: N/A;   CARDIOVERSION N/A 12/18/2020   Procedure: CARDIOVERSION;  Surgeon: Delford Maude BROCKS, MD;  Location: [facility] OR;  Service: Cardiovascular;  Laterality: N/A;   CARDIOVERSION N/A 09/17/2023   Procedure: CARDIOVERSION;  Surgeon: Loni Soyla LABOR, MD;  Location: [facility] INVASIVE CV LAB;  Service: Cardiovascular;  Laterality: N/A;   COLONOSCOPY     hand and arm surgery Right    as a teenager   HAND SURGERY Left    INGUINAL HERNIA REPAIR Right    IR RADIOLOGIST EVAL & MGMT  01/05/2021   JOINT REPLACEMENT Left 12/09/2013   Left Hip    JOINT REPLACEMENT Right 05/05/2014   Right Hip   left knee surgery     PACEMAKER INSERTION  09/25/1996   due to bradycardia   PERMANENT PACEMAKER GENERATOR CHANGE N/A 06/17/2012   Procedure: PERMANENT PACEMAKER GENERATOR CHANGE;  Surgeon: Elspeth BROCKS Sage, MD;  Location: [facility] CATH LAB;  Service: Cardiovascular;  Laterality: N/A;   right and left fem-pop bypass     SHOULDER ARTHROSCOPY WITH ROTATOR CUFF REPAIR Right 05/31/2021   Procedure: RIGHT SHOULDER ARTHROSCOPY ACROMIOPLASTY AND DEBRIDEMENT;  Surgeon: Sheril Maude, MD;  Location: [facility] ORS;  Service: Orthopedics;  Laterality: Right;   TOTAL HIP ARTHROPLASTY Left 12/09/2013   Procedure: TOTAL HIP ARTHROPLASTY ANTERIOR APPROACH;  Surgeon: Maude KANDICE Sheril, MD;  Location: [facility] OR;  Service: Orthopedics;  Laterality: Left;  left anterior total hip arthroplasty   TOTAL HIP ARTHROPLASTY Right 05/05/2014   Procedure: TOTAL HIP ARTHROPLASTY ANTERIOR APPROACH;  Surgeon: Maude KANDICE Sheril, MD;  Location: [facility] OR;  Service: Orthopedics;  Laterality: Right;   TURBINATE REDUCTION     wisdom extracted       Current Outpatient Medications  Medication Sig Dispense Refill   acetaminophen  (TYLENOL ) 500 MG tablet Take 500 mg by mouth every 6 (six) hours as  needed (pain/headaches).     albuterol  (PROVENTIL ) (2.5 MG/3ML) 0.083% nebulizer solution USE 1 VIAL VIA NEBULIZER EVERY 6 HOURS AS NEEDED FOR WHEEZING OR SHORTNESS OF BREATH 120 mL 5   albuterol  (VENTOLIN  HFA) 108 (90 Base) MCG/ACT inhaler Inhale 2 puffs into the lungs every 6 (six) hours as needed for wheezing or shortness of breath. 6.7 g 6   apixaban  (ELIQUIS ) 5 MG TABS tablet Take 1 tablet (5 mg total) by mouth 2 (two) times daily. 200 tablet 2   atorvastatin  (LIPITOR) 80 MG tablet TAKE 1 TABLET BY MOUTH ONCE  DAILY (Patient taking differently: Take 20 mg by mouth in the morning, at noon, in the evening, and at bedtime.) 90 tablet 2   Coenzyme Q10 300 MG CAPS Take 300 mg by mouth in the morning and at bedtime.     Cyanocobalamin  (VITAMIN B 12) 500 MCG  TABS Take 125 mcg by mouth daily.     fenofibrate  160 MG tablet TAKE 1 TABLET BY MOUTH DAILY (Patient taking differently: Take 80 mg by mouth in the morning and at bedtime.) 100 tablet 2   fexofenadine (ALLEGRA) 180 MG tablet Take 90 mg by mouth 2 (two) times daily.     Fluocinonide  Emulsified Base 0.05 % CREA APPLY TO AFFECTED AREA(S)  TWO TIMES DAILY AS NEEDED 60 g 2   folic acid (FOLVITE) 400 MCG tablet Take 400 mcg by mouth in the morning.     Lysine  500 MG TABS Take 500 mg by mouth 3 (three) times daily.     Magnesium  250 MG TABS Take 125 mg by mouth 2 (two) times daily. With vitamin d      nebivolol  (BYSTOLIC ) 5 MG tablet Take 1 tablet (5 mg total) by mouth daily. (Patient taking differently: Take 2.5 mg by mouth in the morning and at bedtime.) 30 tablet 0   No current facility-administered medications for this visit.    Allergies as of 03/24/2024 - Review Complete 03/24/2024  Allergen Reaction Noted   Amiodarone Other (See Comments) 01/07/2008   Niacin Other (See Comments)    Ace inhibitors Cough 01/07/2008   Mometasone furoate Other (See Comments) 02/21/2011    Family History  Problem Relation Age of Onset   Diabetes Mother     Heart disease Mother        Before age 70   Heart disease Father        Before age 15   Heart attack Father    Stroke Father    Aneurysm Father        bilat pop art aneurysms   Peripheral vascular disease Father        Popliteal Aneurysm   Diabetes Other        family hx   Sleep apnea Other        family hx   Allergic rhinitis Neg Hx    Angioedema Neg Hx    Asthma Neg Hx    Eczema Neg Hx    Immunodeficiency Neg Hx    Urticaria Neg Hx    Colon cancer Neg Hx    Esophageal cancer Neg Hx    Prostate cancer Neg Hx    Rectal cancer Neg Hx    Stomach cancer Neg Hx     Social History   Socioeconomic History   Marital status: Married    Spouse name: Lethia Caldron   Number of children: Not on file   Years of education: Not on file   Highest education level: Not on file  Occupational History   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       Lives with wife.  Son and family is with them for now.  [place]arden is living there while n college   Social Drivers of Health   Financial Resource Strain: Medium Risk (10/17/2023)   Overall Financial  Resource Strain (CARDIA)    Difficulty of Paying Living Expenses: Somewhat hard  Food Insecurity: No Food Insecurity (10/17/2023)   Hunger Vital Sign    Worried About Running Out of Food in the Last Year: Never true    Ran Out of Food in the Last Year: Never true  Transportation Needs: No Transportation Needs (10/17/2023)   PRAPARE - Administrator, Civil Service  (Medical): No    Lack of Transportation (Non-Medical): No  Physical Activity: Insufficiently Active (10/17/2023)   Exercise Vital Sign    Days of Exercise per Week: 1 day    Minutes of Exercise per Session: 90 min  Stress: Stress Concern Present (10/17/2023)   [employer] of Occupational Health - Occupational Stress Questionnaire    Feeling of Stress : Rather much  Social Connections: Moderately Isolated (10/17/2023)   Social Connection and Isolation Panel    Frequency of Communication with Friends and Family: Not on file    Frequency of Social Gatherings with Friends and Family: More than three times a week    Attends Religious Services: Never    Database administrator or Organizations: No    Attends Banker Meetings: Never    Marital Status: Married  Catering manager Violence: Not At Risk (10/17/2023)   Humiliation, Afraid, Rape, and Kick questionnaire    Fear of Current or Ex-Partner: No    Emotionally Abused: No    Physically Abused: No    Sexually Abused: No     Physical Exam: BP 90/60 (BP Location: Left Arm, Patient Position: Sitting, Cuff Size: Normal)   Pulse 80   Ht 6' 1.5 (1.867 m) Comment: height measured without shoes  Wt 189 lb 8 oz (86 kg)   BMI 24.66 kg/m   Constitutional: generally well-appearing Psychiatric: alert and oriented x3 Abdomen: soft, nontender, nondistended, no obvious ascites, no peritoneal signs, normal bowel sounds No peripheral edema noted in lower extremities  Labs 11/2022: CBC with low plts of 143. CMP unremarkable. INR elevated at 1.4.  Labs 02/2024: CBC with low plts of 140. BMP nml.   CT A/P w/contrast 01/09/24: IMPRESSION: 1. Acute diverticulitis at the junction of the descending and sigmoid colon, progressing since prior study. No abscess. 2. Cirrhotic configuration of the liver.  Splenic enlargement. 3. Aortic atherosclerosis. 4. Patchy subpleural infiltrates in the lung bases, nonspecific. 5. Small amount of free  fluid in the pelvis is likely reactive.  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 This would be his second episode of diverticulitis.  His last colonoscopy was in 2022 that showed 1 small hyperplastic polyp and diverticulosis at that time. Interestingly he did describe some loose stools and rectal bleeding today.  Will plan to get him scheduled for a colonoscopy due to his recent episode of diverticulitis.  Patient has also been noted to have signs of cirrhosis on his recent CT imaging.  I can see that as far back as in 2022, there has been signs of cirrhosis on imaging.  Patient does mention this in the history of alcohol use, which is likely the etiology of his cirrhosis. Will plan to do labs to rule out alternative sources of liver disease.  Will plan to continue with imaging for [facility] screening and EGD for variceal screening due to presence of thrombocytopenia. I went over the risks and benefits of the  EGD and colonoscopy procedures, and he is agreeable to proceeding. - Check LFTs, PT/INR, ferritin/IBC, ANA, ASMA, IgG, AMA, Hep A antibody, hep B surface antigen, hep B surface antibody, hep C antibody - Next RUQ U/S for HCC screening due in 06/2024 - EGD for variceal screening and colonoscopy for follow up of diverticulitis [facility]. Will hold Eliquis  2 days prior to the procedure - RTC 6 months  Estefana Kidney, MD [facility] 03/24/2024, 2:25 PM  I spent 42 minutes of time, including in depth chart review, independent review of results as outlined above, communicating results with the patient directly, face-to-face time with the patient, coordinating care, ordering studies and medications as appropriate, and documentation.

## 2024-03-25 NOTE — Telephone Encounter (Addendum)
 Patient with diagnosis of PAF on Eliquis  for anticoagulation. Patient had ablation on 03/18/24.  Procedure: Colonoscopy and Endoscopy  Date of procedure: 04/17/24   CHA2DS2-VASc Score = 4  This indicates a 4.8% annual risk of stroke. The patient's score is based upon: CHF History: 1 HTN History: 0 Diabetes History: 0 Stroke History: 0 Vascular Disease History: 1 Age Score: 2 Gender Score: 0     CrCl 71 ml/min Platelet count 140K  Patient had A Fib ablation on 03/18/24. Can not hold anticoagulation for 90 days before procedure. Procedure will need to be rescheduled   **This guidance is not considered finalized until pre-operative APP has relayed final recommendations.**

## 2024-03-25 NOTE — Telephone Encounter (Signed)
   Patient Name: ARACELI COUFAL  DOB: [birth year] MRN: [id number]  Primary Cardiologist: Darryle ONEIDA Decent, MD  Chart reviewed as part of pre-operative protocol coverage.  Patient had an atrial fibrillation ablation on 03/18/2024.  Patient cannot hold anticoagulation for 90 days following ablation.  Recommend procedure be rescheduled following the 90-day period.  Please resubmit request for preoperative cardiac evaluation at that time.  Will remove from preoperative pool as no further APP guidance is needed at this time.  Jerelyn Trimarco D Purvi Ruehl, NP 03/25/2024, 10:37 AM

## 2024-03-27 ENCOUNTER — Ambulatory Visit: Admitting: Pulmonary Disease

## 2024-03-27 ENCOUNTER — Ambulatory Visit: Payer: Self-pay | Admitting: Internal Medicine

## 2024-03-27 DIAGNOSIS — J849 Interstitial pulmonary disease, unspecified: Secondary | ICD-10-CM

## 2024-03-27 LAB — PULMONARY FUNCTION TEST
DL/VA % pred: 86 %
DL/VA: 3.26 ml/min/mmHg/L
DLCO cor % pred: 75 %
DLCO cor: 20.99 ml/min/mmHg
DLCO unc % pred: 74 %
DLCO unc: 20.68 ml/min/mmHg
FEF 25-75 Post: 2.83 L/s
FEF 25-75 Pre: 3.1 L/s
FEF2575-%Change-Post: -8 %
FEF2575-%Pred-Post: 125 %
FEF2575-%Pred-Pre: 137 %
FEV1-%Change-Post: -2 %
FEV1-%Pred-Post: 111 %
FEV1-%Pred-Pre: 113 %
FEV1-Post: 3.75 L
FEV1-Pre: 3.82 L
FEV1FVC-%Change-Post: 0 %
FEV1FVC-%Pred-Pre: 107 %
FEV6-%Change-Post: -1 %
FEV6-%Pred-Post: 110 %
FEV6-%Pred-Pre: 112 %
FEV6-Post: 4.88 L
FEV6-Pre: 4.98 L
FEV6FVC-%Change-Post: 0 %
FEV6FVC-%Pred-Post: 105 %
FEV6FVC-%Pred-Pre: 105 %
FVC-%Change-Post: -2 %
FVC-%Pred-Post: 104 %
FVC-%Pred-Pre: 106 %
FVC-Post: 4.92 L
FVC-Pre: 5.03 L
Post FEV1/FVC ratio: 76 %
Post FEV6/FVC ratio: 99 %
Pre FEV1/FVC ratio: 76 %
Pre FEV6/FVC Ratio: 99 %
RV % pred: 105 %
RV: 3.18 L
TLC % pred: 98 %
TLC: 7.97 L

## 2024-03-27 LAB — ANTI-NUCLEAR AB-TITER (ANA TITER)
ANA TITER: 1:40 {titer} — ABNORMAL HIGH
ANA Titer 1: 1:40 {titer} — ABNORMAL HIGH

## 2024-03-27 LAB — MITOCHONDRIAL ANTIBODIES: Mitochondrial M2 Ab, IgG: <=20 U (ref <=20.0)

## 2024-03-27 LAB — ANA: Anti Nuclear Antibody (ANA): POSITIVE — AB

## 2024-03-27 LAB — HEPATITIS A ANTIBODY, TOTAL: Hepatitis A AB,Total: REACTIVE — AB

## 2024-03-27 LAB — HEPATITIS B SURFACE ANTIGEN: Hepatitis B Surface Ag: NONREACTIVE

## 2024-03-27 LAB — HEPATITIS B SURFACE ANTIBODY,QUALITATIVE: Hep B S Ab: NONREACTIVE

## 2024-03-27 LAB — IGG: IgG (Immunoglobin G), Serum: 1190 mg/dL (ref 600–1540)

## 2024-03-27 LAB — ANTI-SMOOTH MUSCLE ANTIBODY, IGG: Actin (Smooth Muscle) Antibody (IGG): <20 U (ref <20)

## 2024-03-27 LAB — HEPATITIS C ANTIBODY: Hepatitis C Ab: NONREACTIVE

## 2024-03-27 NOTE — Progress Notes (Signed)
 Full PFT performed today.

## 2024-03-27 NOTE — Patient Instructions (Signed)
 Full PFT performed today.

## 2024-03-28 ENCOUNTER — Ambulatory Visit: Admitting: Pulmonary Disease

## 2024-03-28 ENCOUNTER — Encounter: Payer: Self-pay | Admitting: Pulmonary Disease

## 2024-03-28 VITALS — BP 116/71 | HR 88 | Ht 75.0 in | Wt 190.0 lb

## 2024-03-28 DIAGNOSIS — J84112 Idiopathic pulmonary fibrosis: Secondary | ICD-10-CM | POA: Diagnosis not present

## 2024-03-28 NOTE — Patient Instructions (Addendum)
 Your breathings tests are stable  Continue albuterol  nebulizer treatments as needed followed by flutter valve  Use flutter valve 2-3 times per day as needed to stimulate deep cough and mucous clearance  We will check a high resolution CT Chest scan and PFTs in 1 year  Follow up in 1 year to review breathing tests and CT chest results

## 2024-03-28 NOTE — Progress Notes (Signed)
 Synopsis: Referred in December 2022 for ILD by Lynwood Rakers, MD  Subjective:   PATIENT ID: [name] GENDER: male DOB: [birth year], MRN: [id number]  HPI  Chief Complaint  Patient presents with   Follow-up    Pft f/u   [name] is an 83 year old male, never smoker with atrial fibrillation, CKD III, CAD, OSA, DVT and PVD who returns to pulmonary clinic for pulmonary fibrosis.   His breathing remains stable, using a nebulizer once or twice a month for congestion with relief. He has a productive cough without wheezing.   PFTs show mild diffusion defect. Overall stable from past 2 years.  OV 04/03/23 He reports increase in cough and mucous congestion since last visit.  His pulmonary function tests are stable over the past year and remain within normal limits.  He is accompanied by his wife today.  OV 11/20/22 His wife as accompanied him on this visit.   He overall feels well since last visit. No issues with dyspnea. He feels he is coughing a bit more with the weather changes and is using albuterol  nebulizer treatments which helps him clear his airways.   HRCT Chest 10/05/22 is stable compared to 08/11/21.   OV 04/04/22 He had atrial fibrillation ablation on 01/09/22 and has been feeling better since the procedure. He remains in sinus rhythm.   Inflammatory workup up from 12/29/21 is unremarkable.   We discussed treatment with anti-fibrotic medications, their efficacy and their side effect profiles.  OV 12/28/21 PFTs today show moderate diffusion defect. We discussed the correlation between the high resolution CT Chest scan and the PFT results. He has exertional dyspnea only when really pushing himself. Otherwise no daily limitations in his activities due to dyspnea. He is having a repeat atrial fibrillation ablation on May 1.   OV 09/07/21 Patient had CT cardiac scan on 06/23/2021 fibrotic changes at the bilateral lung bases and it was recommended he have a high-resolution  CT chest scan for further evaluation.  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  He was also noted to have mild central cylindrical bronchiectasis and a 4 mm subpleural posterior left lower lobe nodule.  He denies any shortness of breath or limitations to his daily activities.  He denies any wheezing.  He does report a congested cough over the past 34 years.  The cough does not wake him up at night.  He feels like the cough is coming from deep in his chest and will produce whitish phlegm occasionally.  He reports he developed a cough since an episode of pleurisy in 1988.  He is followed by ENT for perforated septum due to cauterization procedures for epistaxis in the past.  He is using Nasacort  nasal spray daily.  He does have postnasal drainage.  He also reports dry nasal/sinus passages.  He has a history of obstructive sleep apnea and using his CPAP machine that is nearly 84 years old.  He does not use humidification.  He does not follow with a sleep medicine physician.  He does report occasional GERD symptoms.  He has reduced his caffeine intake to reduce the symptoms.  He is a never smoker but does report significant secondhand smoke exposure in childhood.  He is on her [place] family homes, a single-family [employer] company.  He denies any significant dust exposures over the years as he has mainly been on the distal side.  He denies any skin rashes or joint aches.  Past Medical History:  Diagnosis Date   Allergic rhinitis    takes Allegra daily   Allergic rhinitis, cause unspecified 06/22/2012   Allergy    Arthritis    Atrial fibrillation (HCC)    takes Tikosyn  and Eliquis  daily   Bilateral popliteal artery aneurysm (HCC)    Chronic kidney disease (CKD), stage III (moderate) (HCC)    Clotting disorder (HCC)    Coronary artery disease    LAD stenting 2004 non-DES   Depression    took Zoloft 44yrs  ago but nothing now   Diverticulosis    DVT (deep venous thrombosis) (HCC)    left popliteal artery greater than 10 years ago   Dysphagia    occasionally   History of blood clots 2003   left leg prior to fem pop   History of colon polyps    Hyperlipidemia    takes Tricor  and Lipitor daily   Insomnia    takes Ambien  nightly as needed   Joint pain    Joint swelling    Neuromuscular disorder (HCC)    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   OSA (obstructive sleep apnea)    CPAP   Pacemaker    Peripheral vascular disease (HCC)    Pleurisy    early 80's   Prostatitis    Sleep apnea    Urinary urgency      Family History  Problem Relation Age of Onset   Diabetes Mother    Heart disease Mother        Before age 59   Heart disease Father        Before age 66   Heart attack Father    Stroke Father    Aneurysm Father        bilat pop art aneurysms   Peripheral vascular disease Father        Popliteal Aneurysm   Diabetes Other        family hx   Sleep apnea Other        family hx   Allergic rhinitis Neg Hx    Angioedema Neg Hx    Asthma Neg Hx    Eczema Neg Hx    Immunodeficiency Neg Hx    Urticaria Neg Hx    Colon cancer Neg Hx    Esophageal cancer Neg Hx    Prostate cancer Neg Hx    Rectal cancer Neg Hx    Stomach cancer Neg Hx      Social History   Socioeconomic History   Marital status: Married    Spouse name: Lethia Caldron   Number of children: Not on file   Years of education: Not on file   Highest education level: Not on file  Occupational History   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       Lives with wife.  Son and family is with them for now.  Darden is living there while n college   Social Drivers of Health   Financial Resource Strain: Medium Risk (10/17/2023)   Overall Financial Resource Strain (CARDIA)    Difficulty of Paying Living Expenses: Somewhat hard  Food Insecurity: No Food Insecurity (10/17/2023)   Hunger Vital Sign    Worried About Running Out of Food in the Last Year: Never true    Ran Out of Food in the Last Year: Never true  Transportation Needs: No Transportation Needs (10/17/2023)   PRAPARE - Administrator, Civil Service (Medical): No    Lack of Transportation (Non-Medical): No  Physical Activity: Insufficiently Active (10/17/2023)   Exercise Vital Sign    Days of Exercise per Week: 1 day    Minutes of Exercise per Session: 90 min  Stress: Stress Concern Present (10/17/2023)   [employer] of Occupational Health - Occupational Stress Questionnaire    Feeling of Stress : Rather much  Social Connections: Moderately Isolated (10/17/2023)   Social Connection and Isolation Panel    Frequency of Communication with Friends and Family: Not on file    Frequency of Social Gatherings with Friends and Family: More than three times a week    Attends Religious Services: Never    Database administrator or Organizations: No    Attends Banker Meetings: Never    Marital Status: Married  Catering manager Violence: Not At Risk (10/17/2023)   Humiliation, Afraid, Rape, and Kick questionnaire    Fear of Current or Ex-Partner: No    Emotionally Abused: No    Physically Abused: No    Sexually Abused: No     Allergies  Allergen Reactions   Amiodarone Other (See Comments)    Peripheral neuropathy resulted   Niacin Other (See Comments)    Caused an irregular heartbeat   Ace Inhibitors Cough   Mometasone  Furoate Other (See Comments)    (Nasonex) Causes nasal bleeding and nose bleeds     Outpatient Medications Prior to Visit  Medication Sig Dispense Refill   acetaminophen  (TYLENOL ) 500 MG tablet Take 500 mg by mouth every 6 (six) hours as needed (pain/headaches).     albuterol  (PROVENTIL ) (2.5 MG/3ML) 0.083% nebulizer solution USE 1 VIAL VIA NEBULIZER EVERY 6 HOURS AS NEEDED FOR WHEEZING OR SHORTNESS OF BREATH 120 mL 5   albuterol  (VENTOLIN  HFA) 108 (90 Base) MCG/ACT inhaler Inhale 2 puffs into the lungs every 6 (six) hours as needed for wheezing or shortness of breath. 6.7 g 6   apixaban  (ELIQUIS ) 5 MG TABS tablet Take 1 tablet (5 mg total) by mouth 2 (two) times daily. 200 tablet 2   atorvastatin  (LIPITOR) 80 MG tablet TAKE 1 TABLET BY MOUTH ONCE  DAILY (Patient taking differently: Take 20 mg by mouth in the morning, at noon, in the evening, and at bedtime.) 90 tablet 2   Coenzyme Q10 300 MG CAPS Take 300 mg by mouth in the morning and at bedtime.     Cyanocobalamin  (VITAMIN B 12) 500 MCG TABS Take 125 mcg by mouth daily.     fenofibrate  160 MG tablet TAKE 1 TABLET BY MOUTH DAILY (Patient taking differently: Take 80 mg by mouth in the morning and at bedtime.) 100 tablet 2   fexofenadine (ALLEGRA) 180 MG tablet Take 90 mg by mouth 2 (two) times daily.     Fluocinonide  Emulsified Base 0.05 % CREA APPLY TO AFFECTED AREA(S)  TWO TIMES DAILY  AS NEEDED 60 g 2   folic acid (FOLVITE) 400 MCG tablet Take 400 mcg by mouth in the morning.     Lysine  500 MG TABS Take 500 mg by mouth 3 (three) times daily.     Magnesium  250 MG TABS Take 125 mg by mouth 2 (two) times daily. With vitamin d      Na Sulfate-K Sulfate-Mg Sulfate concentrate (SUPREP) 17.5-3.13-1.6 GM/177ML SOLN Use as directed; may use generic; goodrx card if insurance will not cover generic 354 mL 0   nebivolol  (BYSTOLIC ) 5 MG tablet Take 1 tablet (5 mg total) by mouth daily. (Patient taking differently: Take 2.5 mg by mouth in the morning and at  bedtime.) 30 tablet 0   No facility-administered medications prior to visit.   Review of Systems  Constitutional:  Negative for chills, fever, malaise/fatigue and weight loss.  HENT:  Negative for congestion, sinus pain and sore throat.   Eyes: Negative.   Respiratory:  Negative for cough, hemoptysis, sputum production, shortness of breath and wheezing.   Cardiovascular:  Negative for chest pain, palpitations, orthopnea, claudication and leg swelling.  Gastrointestinal:  Negative for abdominal pain, heartburn, nausea and vomiting.  Genitourinary: Negative.   Musculoskeletal:  Negative for joint pain and myalgias.  Skin:  Negative for rash.  Neurological:  Negative for weakness.  Endo/Heme/Allergies: Negative.   Psychiatric/Behavioral: Negative.      Objective:   Vitals:   03/28/24 0945  BP: 116/71  Pulse: 88  SpO2: 98%  Weight: 190 lb (86.2 kg)  Height: 6' 3 (1.905 m)   Physical Exam Constitutional:      General: He is not in acute distress. HENT:     Head: Normocephalic and atraumatic.  Eyes:     Conjunctiva/sclera: Conjunctivae normal.  Cardiovascular:     Rate and Rhythm: Normal rate and regular rhythm.     Pulses: Normal pulses.     Heart sounds: Normal heart sounds. No murmur heard. Pulmonary:     Effort: Pulmonary effort is normal.     Breath sounds: No wheezing, rhonchi or rales.  Musculoskeletal:     Right lower leg: No edema.     Left lower leg: No edema.  Skin:    General: Skin is warm and dry.  Neurological:     General: No focal deficit present.     Mental Status: He is alert.    CBC    Component Value Date/Time   WBC 5.8 03/10/2024 1127   WBC 6.3 01/27/2024 1527   RBC 4.36 03/10/2024 1127   RBC 4.17 (L) 01/27/2024 1527   HGB 14.1 03/10/2024 1127   HCT 43.8 03/10/2024 1127   PLT 140 (L) 03/10/2024 1127   MCV 101 (H) 03/10/2024 1127   MCH 32.3 03/10/2024 1127   MCH 32.4 01/27/2024 1527   MCHC 32.2 03/10/2024 1127   MCHC 31.9 01/27/2024  1527   RDW 12.8 03/10/2024 1127   LYMPHSABS 0.9 05/23/2023 1400   LYMPHSABS 1.2 12/15/2021 1533   MONOABS 0.5 05/23/2023 1400   EOSABS 0.2 05/23/2023 1400   EOSABS 0.2 12/15/2021 1533   BASOSABS 0.0 05/23/2023 1400   BASOSABS 0.0 12/15/2021 1533      Latest Ref Rng & Units 03/10/2024   11:22 AM 02/11/2024    1:58 PM 01/27/2024    3:27 PM  BMP  Glucose 70 - 99 mg/dL 77  893  838   BUN 8 - 27 mg/dL 17  18  30    Creatinine 0.76 - 1.27 mg/dL  0.94  1.06  1.53   BUN/Creat Ratio 10 - 24 18  17     Sodium 134 - 144 mmol/L 142  143  138   Potassium 3.5 - 5.2 mmol/L 4.7  4.4  4.1   Chloride 96 - 106 mmol/L 104  107  105   CO2 20 - 29 mmol/L 24  23  21    Calcium  8.6 - 10.2 mg/dL 9.9  9.7  9.7    Chest imaging: HRCT Chest 10/05/22 1. No significant change in mild bibasilar pulmonary fibrosis featuring somewhat heterogeneously distributed irregular peripheral interstitial opacity, septal thickening, and subpleural bronchiolectasis at the lung bases. 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 Am JINNY [employer] Crit Care Med Vol 198, Iss 5, [phone], May 12 2017. 2. Coronary artery disease. 3. Aortic valve calcifications. Correlate for echocardiographic evidence of aortic valve dysfunction.  HRCT Chest 08/11/21 1. Pulmonary parenchymal pattern of fibrosis may be due to usual interstitial pneumonitis or nonspecific interstitial pneumonitis. Findings are categorized as probable UIP per consensus guidelines: Diagnosis of Idiopathic Pulmonary Fibrosis: An Official ATS/ERS/JRS/ALAT Clinical Practice Guideline. Am JINNY [employer] Crit Care Med Vol 198, Iss 5, [id number], May 12 2017. 2. Small pericardial effusion. 3. Mild central cylindrical bronchiectasis. 4. 4 mm subpleural posterior left lower lobe nodule. No follow-up needed if patient is low-risk. Non-contrast chest CT can  be considered in 12 months if patient is high-risk. This recommendation follows the consensus statement: Guidelines for Management of Incidental Pulmonary Nodules Detected on CT Images: From the Fleischner Society 2017; Radiology 2017; [id number]:228-243. 5. Cirrhosis. 6. Aortic atherosclerosis (ICD10-I70.0). Coronary artery calcification.  PFT:    Latest Ref Rng & Units 03/27/2024    2:49 PM 04/03/2023    2:49 PM 12/28/2021    3:00 PM  PFT Results  FVC-Pre L 5.03   4.44   FVC-Predicted Pre % 106  96  92   FVC-Post L 4.92  4.68  4.40   FVC-Predicted Post % 104  98  91   Pre FEV1/FVC % % 76  74  75   Post FEV1/FCV % % 76  75  74   FEV1-Pre L 3.82  3.41  3.32   FEV1-Predicted Pre % 113  100  96   FEV1-Post L 3.75  3.52  3.24   DLCO uncorrected ml/min/mmHg 20.68  21.71  15.84   DLCO UNC% % 74  78  56   DLCO corrected ml/min/mmHg 20.99  21.71  16.07   DLCO COR %Predicted % 75  78  57   DLVA Predicted % 86  97  73   TLC L 7.97  10.19  7.05   TLC % Predicted % 98  126  87   RV % Predicted % 105  60  92   12/28/21: Mild diffusion defect. 03/27/24: Mild diffusion defect  Labs:  Path:  Echo 11/2020: LV EF 40-45%. Abnormal septal motion, consistent with left bundle branch block. Grade II diastolic dysfunction. RV systolic pressure is normal. RV size is moderately enlarged. LA severely dilated. RA mildly dilated.   Heart Catheterization:  Assessment & Plan:   Idiopathic pulmonary fibrosis (HCC) - Plan: Pulmonary Function Test, CT CHEST HIGH RESOLUTION  Discussion: [name] is an 83 year old male, never smoker with atrial fibrillation, CKD III, CAD, OSA, DVT and PVD who returns to pulmonary clinic for pulmonary fibrosis.   He has stable disease based on monitoring, with stable  pulmonary function tests with mild diffusion defect.  He remains very functional and has good quality of life.  He continues with cough and mucus production.  He reports improvement with nebulizer treatments.  Provided with flutter valve today for airway clearance  He wishes to monitor his disease at this time. He is not interested in anti-fibrotic therapy.  Follow up in 1 year with CT chest scan and PFTs  Dorn Chill, MD [facility] Office: [phone]    Current Outpatient Medications:    acetaminophen  (TYLENOL ) 500 MG tablet, Take 500 mg by mouth every 6 (six) hours as needed (pain/headaches)., Disp: , Rfl:    albuterol  (PROVENTIL ) (2.5 MG/3ML) 0.083% nebulizer solution, USE 1 VIAL VIA NEBULIZER EVERY 6 HOURS AS NEEDED FOR WHEEZING OR SHORTNESS OF BREATH, Disp: 120 mL, Rfl: 5   albuterol  (VENTOLIN  HFA) 108 (90 Base) MCG/ACT inhaler, Inhale 2 puffs into the lungs every 6 (six) hours as needed for wheezing or shortness of breath., Disp: 6.7 g, Rfl: 6   apixaban  (ELIQUIS ) 5 MG TABS tablet, Take 1 tablet (5 mg total) by mouth 2 (two) times daily., Disp: 200 tablet, Rfl: 2   atorvastatin  (LIPITOR) 80 MG tablet, TAKE 1 TABLET BY MOUTH ONCE  DAILY (Patient taking differently: Take 20 mg by mouth in the morning, at noon, in the evening, and at bedtime.), Disp: 90 tablet, Rfl: 2   Coenzyme Q10 300 MG CAPS, Take 300 mg by mouth in the morning and at bedtime., Disp: , Rfl:    Cyanocobalamin  (VITAMIN B 12) 500 MCG TABS, Take 125 mcg by mouth daily., Disp: , Rfl:    fenofibrate  160 MG tablet, TAKE 1 TABLET BY MOUTH DAILY (Patient taking differently: Take 80 mg by mouth in the morning and at bedtime.), Disp: 100 tablet, Rfl: 2   fexofenadine (ALLEGRA) 180 MG tablet, Take 90 mg by mouth 2 (two) times daily., Disp: , Rfl:    Fluocinonide  Emulsified Base 0.05 % CREA, APPLY TO AFFECTED AREA(S)  TWO TIMES DAILY AS NEEDED, Disp: 60 g, Rfl: 2   folic acid (FOLVITE) 400 MCG tablet, Take 400 mcg by mouth in the morning., Disp: , Rfl:    Lysine  500 MG TABS, Take 500 mg by mouth 3 (three) times daily., Disp: , Rfl:    Magnesium  250 MG TABS, Take 125 mg by mouth 2 (two) times daily. With  vitamin d , Disp: , Rfl:    Na Sulfate-K Sulfate-Mg Sulfate concentrate (SUPREP) 17.5-3.13-1.6 GM/177ML SOLN, Use as directed; may use generic; goodrx card if insurance will not cover generic, Disp: 354 mL, Rfl: 0   nebivolol  (BYSTOLIC ) 5 MG tablet, Take 1 tablet (5 mg total) by mouth daily. (Patient taking differently: Take 2.5 mg by mouth in the morning and at bedtime.), Disp: 30 tablet, Rfl: 0

## 2024-04-02 NOTE — Telephone Encounter (Signed)
 Spoke to patient advised he was not cleared by cardiology due to patient having an atrial fibrillation ablation on 03/18/2024.  Patient cannot hold anticoagulation for 90 days following ablation. Scheduled patient a follow up appointment in office with an app to discuss ECL after anti coag therapy.

## 2024-04-04 ENCOUNTER — Telehealth: Payer: Self-pay | Admitting: Internal Medicine

## 2024-04-04 NOTE — Telephone Encounter (Signed)
 Copied from CRM [id number]. 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

## 2024-04-05 ENCOUNTER — Other Ambulatory Visit: Payer: Self-pay | Admitting: Internal Medicine

## 2024-04-07 NOTE — Telephone Encounter (Signed)
 Called and informed patient that form was completed. Will place up front for pick up

## 2024-04-07 NOTE — Telephone Encounter (Signed)
Ok done hardcopy to cma 

## 2024-04-07 NOTE — Telephone Encounter (Signed)
 Patient dropped off document Insurance Form, to be filled out by provider. Patient requested to send it back via Call Patient to pick up within 7-days. Document is located in providers tray at front office.Please advise at Mobile [phone] (mobile)  Patient would like form completed as soon as possible. He said he has to send it to his insurance company today.

## 2024-04-07 NOTE — Telephone Encounter (Signed)
 Copied from CRM [id number]. 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 Per patient he was told it can be completed today.  >> Apr 07, 2024 11:05 AM Antonio H wrote: Patient calling back regarding form because he hadn't received a call back yet. Wanted to know if he could drop off the form today. Called CAL and confirmed it was okay. Let patient know he could drop off form.

## 2024-04-07 NOTE — Telephone Encounter (Signed)
 Have since provided this form to Dr.John, awaiting signature

## 2024-04-07 NOTE — Telephone Encounter (Signed)
 Copied from CRM [id number]. Topic: General - Other >> Apr 07, 2024 11:05 AM [name] wrote: Patient calling back regarding form because he hadn't received a call back yet. Wanted to know if he could drop off the form today. Called CAL and confirmed it was okay. Let patient know he could drop off form.  >> Apr 07, 2024 11:38 AM [name] wrote: Patient is calling to inform that he is in the waiting area at Dr.John's office to have this form completed. Per patient he was told it can be completed today.  >> Apr 07, 2024 11:52 AM [name] H wrote: Patient called back, stating he is in the office and was told he has to wait 7-10 business days form paperwork to be filled out. [place] he needs paperwork completed today to overnight to [employer] and could not wait that long. [place] he was sure Dr. Norleen would fill it out for him. Asked if I was there to get paperwork from him. Called CAL to see if they could go and speak with the patient.  ---  PW has been given to Dr. Norleen, but 7-10 days is the expected timeline of completion. Went to speak to pt who had been waiting in the lobby, but he was not there   Please call pt when form is ready for pick up. [phone]   TDW

## 2024-04-17 ENCOUNTER — Encounter: Admitting: Internal Medicine

## 2024-04-18 ENCOUNTER — Ambulatory Visit (HOSPITAL_COMMUNITY)
Admission: RE | Admit: 2024-04-18 | Discharge: 2024-04-18 | Disposition: A | Discharge status: Home / Self Care | Source: Ambulatory Visit | Attending: Physician Assistant | Admitting: Physician Assistant

## 2024-04-18 ENCOUNTER — Encounter (HOSPITAL_COMMUNITY): Payer: Self-pay | Admitting: Physician Assistant

## 2024-04-18 VITALS — BP 114/70 | HR 70 | Ht 75.0 in | Wt 192.2 lb

## 2024-04-18 DIAGNOSIS — D6869 Other thrombophilia: Secondary | ICD-10-CM | POA: Diagnosis not present

## 2024-04-18 DIAGNOSIS — I480 Paroxysmal atrial fibrillation: Secondary | ICD-10-CM

## 2024-04-18 NOTE — Progress Notes (Signed)
 Primary Care Physician: Norleen Lynwood ORN, MD Primary Cardiologist: Dr Barbaraann Primary Electrophysiologist: Dr Kennyth Referring Physician: Dr Kelsie Vevelyn [name] is a 83 y.o. male with a history of sick sinus syndrome s/p PPM, CAD, PVD, OSA, HLD, chronic systolic CHF, atrial fibrillation who presents for follow up in the [facility]. Patient is on Eliquis  for stroke prevention. Patient underwent afib ablation with Dr Kelsie on 06/30/21. Unfortunately, his afib burden increased again and he underwent repeat ablation on 01/09/22. He was maintained on dofetilide  but this was eventually discontinued. Seen by Dr Kennyth and underwent afib and flutter ablation on 03/18/24.  Patient returns for follow up for atrial fibrillation. He reports that he has done well since the ablation with no interim symptoms of afib. He denies chest pain or groin issues. No bleeding issues on anticoagulation.   Today, he  denies symptoms of palpitations, chest pain, shortness of breath, orthopnea, PND, lower extremity edema, dizziness, presyncope, syncope, bleeding, or neurologic sequela. The patient is tolerating medications without difficulties and is otherwise without complaint today.    Atrial Fibrillation Risk Factors:  he does have symptoms or diagnosis of sleep apnea. he does not have a history of rheumatic fever. he does not have a history of alcohol use.   Atrial Fibrillation Management history:  Previous antiarrhythmic drugs: amiodarone, dofetilide   Previous cardioversions: 12/18/20 Previous ablations: 06/30/21, 01/09/22, 03/18/24 Anticoagulation history: Eliquis    Past Medical History:  Diagnosis Date   Allergic rhinitis    takes Allegra daily   Allergic rhinitis, cause unspecified 06/22/2012   Allergy    Arthritis    Atrial fibrillation (HCC)    takes Tikosyn  and Eliquis  daily   Bilateral popliteal artery aneurysm (HCC)    Chronic kidney disease (CKD), stage III (moderate)  (HCC)    Clotting disorder (HCC)    Coronary artery disease    LAD stenting 2004 non-DES   Depression    took Zoloft 44yrs ago but nothing now   Diverticulosis    DVT (deep venous thrombosis) (HCC)    left popliteal artery greater than 10 years ago   Dysphagia    occasionally   History of blood clots 2003   left leg prior to fem pop   History of colon polyps    Hyperlipidemia    takes Tricor  and Lipitor daily   Insomnia    takes Ambien  nightly as needed   Joint pain    Joint swelling    Neuromuscular disorder (HCC)    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     albuterol  (PROVENTIL ) (2.5 MG/3ML) 0.083% nebulizer solution USE 1 VIAL VIA NEBULIZER EVERY 6 HOURS AS NEEDED FOR WHEEZING OR SHORTNESS OF BREATH 120 mL 5   albuterol  (VENTOLIN  HFA) 108 (90 Base) MCG/ACT inhaler Inhale 2 puffs into the lungs every 6 (six) hours as needed for wheezing or shortness of breath. 6.7 g 6   apixaban  (ELIQUIS ) 5 MG TABS tablet Take 1 tablet (5 mg total) by mouth 2 (two) times  daily. 200 tablet 2   atorvastatin  (LIPITOR) 80 MG tablet TAKE 1 TABLET BY MOUTH ONCE  DAILY 100 tablet 2   Coenzyme Q10 300 MG CAPS Take 300 mg by mouth in the morning and at bedtime.     Cyanocobalamin  (VITAMIN B 12) 500 MCG TABS Take 125 mcg by mouth daily.     fenofibrate  160 MG tablet TAKE 1 TABLET BY MOUTH DAILY 100 tablet 2   fexofenadine (ALLEGRA) 180 MG tablet Take 90 mg by mouth 2 (two) times daily.     Fluocinonide  Emulsified Base 0.05 % CREA APPLY TO AFFECTED AREA(S)  TWO TIMES DAILY AS NEEDED 60 g 2   folic acid (FOLVITE) 400 MCG tablet Take 400 mcg by  mouth in the morning.     Lysine  500 MG TABS Take 500 mg by mouth 3 (three) times daily.     Magnesium  250 MG TABS Take 125 mg by mouth 2 (two) times daily. With vitamin d      Na Sulfate-K Sulfate-Mg Sulfate concentrate (SUPREP) 17.5-3.13-1.6 GM/177ML SOLN Use as directed; may use generic; goodrx card if insurance will not cover generic 354 mL 0   nebivolol  (BYSTOLIC ) 5 MG tablet Take 1 tablet (5 mg total) by mouth daily. 30 tablet 0   No current facility-administered medications for this encounter.    ROS- All systems are reviewed and negative except as per the HPI above.  Physical Exam: Vitals:   04/18/24 1333  BP: 114/70  Pulse: 70  Weight: 87.2 kg  Height: 6' 3 (1.905 m)     GEN: Well nourished, well developed in no acute distress CARDIAC: Regular rate and rhythm, no murmurs, rubs, gallops RESPIRATORY:  Clear to auscultation without rales, wheezing or rhonchi  ABDOMEN: Soft, non-tender, non-distended EXTREMITIES:  No edema; No deformity    Wt Readings from Last 3 Encounters:  04/18/24 87.2 kg  03/28/24 86.2 kg  03/24/24 86 kg    EKG today demonstrates  Atrial paced rhythm, LBBB Vent. rate 70 BPM PR interval 234 ms QRS duration 134 ms QT/QTcB 430/464 ms   Echo 07/03/23 demonstrated   1. Left ventricular ejection fraction, by estimation, is 40 to 45%. Left  ventricular ejection fraction by 3D volume is 42 %. The left ventricle has  mildly decreased function. The left ventricle demonstrates regional wall  motion abnormalities (see scoring diagram/findings for description). There is severe concentric left ventricular hypertrophy. Left ventricular diastolic parameters are indeterminate.   2. Right ventricular systolic function is normal. The right ventricular  size is normal. There is normal pulmonary artery systolic pressure.   3. Left atrial size was mildly dilated.   4. The mitral valve is abnormal. Trivial mitral valve regurgitation.   5. In the setting of  decreased stroke volume index and calcified valve,  mild low flow low gradient aortic stenosis is suspected. The aortic valve  is tricuspid. There is mild calcification of the aortic valve. There is  mild thickening of the aortic  valve. Aortic valve regurgitation is not visualized. Mild aortic valve  stenosis. Aortic valve Vmax measures 2.19 m/s.   6. The inferior vena cava is normal in size with greater than 50%  respiratory variability, suggesting right atrial pressure of 3 mmHg.   Comparison(s): No significant change from prior study. Prior images  reviewed side by side.   Epic records are reviewed at length today  CHA2DS2-VASc Score = 4  The patient's score is based upon: CHF History: 1 HTN History: 0 Diabetes History: 0  Stroke History: 0 Vascular Disease History: 1 Age Score: 2 Gender Score: 0       ASSESSMENT AND PLAN: Paroxysmal Atrial Fibrillation/typical atrial flutter (ICD10:  I48.0) The patient's CHA2DS2-VASc score is 4, indicating a 4.8% annual risk of stroke.   S/p afib ablation 06/30/21, 01/09/22, and afib and flutter ablation 03/18/24 No longer on dofetilide , did not tolerate amiodarone.  Patient appears to be maintaining SR Continue Eliquis  5 mg BID with no missed doses for 3 months post ablation.  Continue nebivolol  5 mg daily   Secondary Hypercoagulable State (ICD10:  D68.69) The patient is at significant risk for stroke/thromboembolism based upon his CHA2DS2-VASc Score of 4.  Continue Apixaban  (Eliquis ). No bleeding issues.   CAD No anginal symptoms  OSA  Encouraged nightly CPAP  Chronic HFrEF EF 40-45% Fluid status appears stable today  SND S/p PPM, followed by Dr Kennyth   Follow up with Dr Kennyth as scheduled.    Daril Kicks PA-C [facility] [address] [place], [place] [address] [phone] 04/18/2024 1:49 PM

## 2024-04-26 ENCOUNTER — Encounter: Payer: Self-pay | Admitting: Cardiology

## 2024-04-26 ENCOUNTER — Other Ambulatory Visit: Payer: Self-pay | Admitting: Internal Medicine

## 2024-04-26 DIAGNOSIS — I480 Paroxysmal atrial fibrillation: Secondary | ICD-10-CM

## 2024-04-29 NOTE — Telephone Encounter (Signed)
 Prescription refill request for Eliquis  received. Indication:afib Last office visit:8/25 Scr:0.94  6/25 Age: 83 Weight:87.2  kg  Prescription refilled

## 2024-05-05 MED ORDER — NEBIVOLOL HCL 5 MG PO TABS: 5.0000 mg | ORAL_TABLET | Freq: Every day | ORAL | 0 refills | Status: DC

## 2024-05-15 ENCOUNTER — Encounter: Payer: Self-pay | Admitting: Cardiology

## 2024-05-19 DIAGNOSIS — L308 Other specified dermatitis: Secondary | ICD-10-CM | POA: Diagnosis not present

## 2024-05-23 ENCOUNTER — Telehealth: Admitting: Family Medicine

## 2024-05-23 DIAGNOSIS — J019 Acute sinusitis, unspecified: Secondary | ICD-10-CM

## 2024-05-23 DIAGNOSIS — B9689 Other specified bacterial agents as the cause of diseases classified elsewhere: Secondary | ICD-10-CM

## 2024-05-23 MED ORDER — DOXYCYCLINE HYCLATE 100 MG PO TABS: 100.0000 mg | ORAL_TABLET | Freq: Two times a day (BID) | ORAL | 0 refills | Status: AC

## 2024-05-23 NOTE — Progress Notes (Signed)
 E-Visit for Sinus Problems  We are sorry that you are not feeling well.  Here is how we plan to help!  Based on what you have shared with me it looks like you have sinusitis.  Sinusitis is inflammation and infection in the sinus cavities of the head.  Based on your presentation I believe you most likely have Acute Bacterial Sinusitis.  This is an infection caused by bacteria and is treated with antibiotics. I have prescribed Doxycycline  100 mg by mouth twice a day for 7 days. You may use an oral decongestant such as Mucinex D or if you have glaucoma or high blood pressure use plain Mucinex. Saline nasal spray help and can safely be used as often as needed for congestion.  If you develop worsening sinus pain, fever or notice severe headache and vision changes, or if symptoms are not better after completion of antibiotic, please schedule an appointment with a health care provider.    Sinus infections are not as easily transmitted as other respiratory infection, however we still recommend that you avoid close contact with loved ones, especially the very young and elderly.  Remember to wash your hands thoroughly throughout the day as this is the number one way to prevent the spread of infection!  Home Care: Only take medications as instructed by your medical team. Complete the entire course of an antibiotic. Do not take these medications with alcohol. A steam or ultrasonic humidifier can help congestion.  You can place a towel over your head and breathe in the steam from hot water coming from a faucet. Avoid close contacts especially the very young and the elderly. Cover your mouth when you cough or sneeze. Always remember to wash your hands.  Get Help Right Away If: You develop worsening fever or sinus pain. You develop a severe head ache or visual changes. Your symptoms persist after you have completed your treatment plan.  Make sure you Understand these instructions. Will watch your  condition. Will get help right away if you are not doing well or get worse.  Thank you for choosing an e-visit.  Your e-visit answers were reviewed by a board certified advanced clinical practitioner to complete your personal care plan. Depending upon the condition, your plan could have included both over the counter or prescription medications.  Please review your pharmacy choice. Make sure the pharmacy is open so you can pick up prescription now. If there is a problem, you may contact your provider through [employer] and have the prescription routed to another pharmacy.  Your safety is important to us . If you have drug allergies check your prescription carefully.   For the next 24 hours you can use MyChart to ask questions about today's visit, request a non-urgent call back, or ask for a work or school excuse. You will get an email in the next two days asking about your experience. I hope that your e-visit has been valuable and will speed your recovery.  I provided 5 minutes of non face-to-face time during this encounter for chart review, medication and order placement, as well as and documentation.

## 2024-05-27 ENCOUNTER — Other Ambulatory Visit: Payer: Self-pay | Admitting: Cardiology

## 2024-05-30 DIAGNOSIS — R42 Dizziness and giddiness: Secondary | ICD-10-CM | POA: Diagnosis not present

## 2024-06-09 ENCOUNTER — Encounter: Payer: Self-pay | Admitting: Internal Medicine

## 2024-06-09 NOTE — Progress Notes (Signed)
 Pharmacy Quality Measure Review  This patient is appearing on a report for being at risk of failing the adherence measure for cholesterol (statin) medications this calendar year.   Medication: Atorvastatin  80mg  Last fill date: 08/16 for 100 day supply  Insurance report was not up to date. No action needed at this time.    Angela Baalmann, PharmD [facility] Pharmacist

## 2024-06-16 ENCOUNTER — Other Ambulatory Visit: Payer: Self-pay | Admitting: Cardiology

## 2024-06-24 ENCOUNTER — Ambulatory Visit (HOSPITAL_COMMUNITY)
Admission: RE | Admit: 2024-06-24 | Discharge: 2024-06-24 | Disposition: A | Discharge status: Home / Self Care | Source: Ambulatory Visit | Attending: Internal Medicine | Admitting: Internal Medicine

## 2024-06-24 DIAGNOSIS — K5732 Diverticulitis of large intestine without perforation or abscess without bleeding: Secondary | ICD-10-CM | POA: Diagnosis not present

## 2024-06-24 DIAGNOSIS — D696 Thrombocytopenia, unspecified: Secondary | ICD-10-CM | POA: Diagnosis not present

## 2024-06-24 DIAGNOSIS — K7460 Unspecified cirrhosis of liver: Secondary | ICD-10-CM | POA: Diagnosis not present

## 2024-06-24 DIAGNOSIS — K7689 Other specified diseases of liver: Secondary | ICD-10-CM | POA: Diagnosis not present

## 2024-06-29 NOTE — Progress Notes (Unsigned)
 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  Since last being seen in our clinic the patient reports doing well post ablation.  He has not had any evidence of AF since his ablation with Dr. Kitty in July.  He reports no issues with bleeding on Eliquis .  He is working out at [employer] 3 times a week without difficulty.  He denies chest pain, palpitations, dyspnea, PND, orthopnea, nausea, vomiting, dizziness, syncope, edema, weight gain, or early satiety.    Review of systems complete and found to be negative unless listed in HPI.    EP Information / Studies Reviewed:    EKG is ordered today. Personal review as below.  EKG Interpretation Date/Time:  Tuesday July 01 2024 14:38:35 EDT Ventricular Rate:  72 PR Interval:  230 QRS Duration:  136 QT Interval:  438 QTC Calculation: 479 R Axis:   -10  Text Interpretation: Atrial-paced rhythm with prolonged AV conduction Left bundle branch block Confirmed by Aniceto Jarvis ([id number]) on 07/01/2024 2:45:31 PM   PPM Interrogation-Limited device check as below for AF burden  Device History: Biotronik Dual Chamber PPM implanted 09/25/1996  for Sinus Node Dysfunction Generator Change 06/17/12   Arrhythmia / AAD / Pertinent EP Studies AF s/p Ablation 01/2022 with additional mapping and ablation in the LA AFL  Tikosyn  > stopped in 01/2012 after ablation by Dr. Kelsie EPS 03/18/24 > diffuse dense scarring in LA, re-isolation of reconnected RSPF and antrum of the LIPV, ablation of PW, ablation of CTI, ablation of per-mitral flutter  Risk  Assessment/Calculations:    CHA2DS2-VASc Score = 4   This indicates a 4.8% annual risk of stroke. The patient's score is based upon: CHF History: 1 HTN History: 0 Diabetes History: 0 Stroke History: 0 Vascular Disease History: 1 Age Score: 2 Gender Score: 0             Physical Exam:   VS:  BP (!) 97/58 (BP Location: Left Arm, Patient Position: Sitting, Cuff Size: Normal)   Pulse 67   Ht 6' 3 (1.905 m)   Wt 195 lb 14.4 oz (88.9 kg)   SpO2 98%   BMI 24.49 kg/m    Wt Readings from Last 3 Encounters:  07/01/24 195 lb 14.4 oz (88.9 kg)  04/18/24 192 lb 3.2 oz (87.2 kg)  03/28/24 190 lb (86.2 kg)     GEN: Well nourished, well developed in no acute distress NECK: No JVD; No carotid bruits CARDIAC: Regular rate and rhythm, no murmurs, rubs, gallops RESPIRATORY:  Clear to auscultation without rales, wheezing or rhonchi  ABDOMEN: Soft, non-tender, non-distended EXTREMITIES:  No edema; No deformity   ASSESSMENT AND PLAN:    SND s/p Biotronik PPM  -Normal PPM function -See Pace Art report -No changes today  Paroxysmal Atrial Fibrillation  CHA2DS2-VASc 4 -reduce bystolic  to 2.5mg  daily with soft BP /post ablation maintaining NSR -OAC for stroke prophylaxis  -no AF burden post ablation  -0% burden on device /no AF episodes noted  Secondary Hypercoagulable State  -continue Eliquis  5mg  BID, dose reviewed and appropriate by wt / Cr  NICM  LBBB CAD s/p Stent for diffuse Non-Obs Disease  -no anginal symptoms    Disposition:   Follow up with Dr. Kennyth in 6 months  Signed, Daphne Barrack, NP-C, AGACNP-BC [facility] - Electrophysiology  07/01/2024, 2:46 PM

## 2024-07-01 ENCOUNTER — Ambulatory Visit: Attending: Pulmonary Disease | Admitting: Pulmonary Disease

## 2024-07-01 ENCOUNTER — Ambulatory Visit: Admitting: Cardiology

## 2024-07-01 ENCOUNTER — Encounter: Payer: Self-pay | Admitting: Pulmonary Disease

## 2024-07-01 VITALS — BP 97/58 | HR 67 | Ht 75.0 in | Wt 195.9 lb

## 2024-07-01 DIAGNOSIS — I495 Sick sinus syndrome: Secondary | ICD-10-CM | POA: Diagnosis not present

## 2024-07-01 DIAGNOSIS — Z950 Presence of cardiac pacemaker: Secondary | ICD-10-CM | POA: Diagnosis not present

## 2024-07-01 DIAGNOSIS — I428 Other cardiomyopathies: Secondary | ICD-10-CM

## 2024-07-01 DIAGNOSIS — I480 Paroxysmal atrial fibrillation: Secondary | ICD-10-CM | POA: Diagnosis not present

## 2024-07-01 DIAGNOSIS — I4892 Unspecified atrial flutter: Secondary | ICD-10-CM

## 2024-07-01 DIAGNOSIS — D6869 Other thrombophilia: Secondary | ICD-10-CM

## 2024-07-01 DIAGNOSIS — I447 Left bundle-branch block, unspecified: Secondary | ICD-10-CM | POA: Diagnosis not present

## 2024-07-01 MED ORDER — NEBIVOLOL HCL 2.5 MG PO TABS: 2.5000 mg | ORAL_TABLET | Freq: Every day | ORAL | 3 refills | Status: AC

## 2024-07-01 NOTE — Patient Instructions (Signed)
 Medication Instructions:  Decrease bystolic  to 2.5 mg daily *If you need a refill on your cardiac medications before your next appointment, please call your pharmacy*  Lab Work: None ordered If you have labs (blood work) drawn today and your tests are completely normal, you will receive your results only by: MyChart Message (if you have MyChart) OR A paper copy in the mail If you have any lab test that is abnormal or we need to change your treatment, we will call you to review the results.  Follow-Up: At [facility], you and your health needs are our priority.  As part of our continuing mission to provide you with exceptional heart care, our providers are all part of one team.  This team includes your primary Cardiologist (physician) and Advanced Practice Providers or APPs (Physician Assistants and Nurse Practitioners) who all work together to provide you with the care you need, when you need it.  Your next appointment:   6 month(s)  Provider:   Fonda Kitty, MD or Daphne Barrack, NP

## 2024-07-03 ENCOUNTER — Encounter: Payer: Self-pay | Admitting: Physician Assistant

## 2024-07-03 ENCOUNTER — Other Ambulatory Visit

## 2024-07-03 ENCOUNTER — Ambulatory Visit: Admitting: Physician Assistant

## 2024-07-03 ENCOUNTER — Other Ambulatory Visit: Payer: Self-pay

## 2024-07-03 ENCOUNTER — Telehealth: Payer: Self-pay

## 2024-07-03 VITALS — BP 100/52 | HR 66 | Ht 75.0 in | Wt 195.4 lb

## 2024-07-03 DIAGNOSIS — K7460 Unspecified cirrhosis of liver: Secondary | ICD-10-CM | POA: Diagnosis not present

## 2024-07-03 DIAGNOSIS — E538 Deficiency of other specified B group vitamins: Secondary | ICD-10-CM

## 2024-07-03 DIAGNOSIS — D509 Iron deficiency anemia, unspecified: Secondary | ICD-10-CM

## 2024-07-03 DIAGNOSIS — D696 Thrombocytopenia, unspecified: Secondary | ICD-10-CM | POA: Diagnosis not present

## 2024-07-03 DIAGNOSIS — Z860100 Personal history of colon polyps, unspecified: Secondary | ICD-10-CM

## 2024-07-03 DIAGNOSIS — I480 Paroxysmal atrial fibrillation: Secondary | ICD-10-CM | POA: Diagnosis not present

## 2024-07-03 DIAGNOSIS — K5732 Diverticulitis of large intestine without perforation or abscess without bleeding: Secondary | ICD-10-CM

## 2024-07-03 LAB — COMPREHENSIVE METABOLIC PANEL WITH GFR
ALT: 21 U/L (ref 0–53)
AST: 28 U/L (ref 0–37)
Albumin: 4.4 g/dL (ref 3.5–5.2)
Alkaline Phosphatase: 51 U/L (ref 39–117)
BUN: 26 mg/dL — ABNORMAL HIGH (ref 6–23)
CO2: 30 meq/L (ref 19–32)
Calcium: 9.9 mg/dL (ref 8.4–10.5)
Chloride: 107 meq/L (ref 96–112)
Creatinine, Ser: 1.01 mg/dL (ref 0.40–1.50)
GFR: 68.16 mL/min (ref >60.00)
Glucose, Bld: 86 mg/dL (ref 70–99)
Potassium: 4.8 meq/L (ref 3.5–5.1)
Sodium: 143 meq/L (ref 135–145)
Total Bilirubin: 0.5 mg/dL (ref 0.2–1.2)
Total Protein: 7.4 g/dL (ref 6.0–8.3)

## 2024-07-03 LAB — CBC WITH DIFFERENTIAL/PLATELET
Basophils Absolute: 0.1 K/uL (ref 0.0–0.1)
Basophils Relative: 1 % (ref 0.0–3.0)
Eosinophils Absolute: 0.2 K/uL (ref 0.0–0.7)
Eosinophils Relative: 3.7 % (ref 0.0–5.0)
HCT: 39.5 % (ref 39.0–52.0)
Hemoglobin: 13.1 g/dL (ref 13.0–17.0)
Lymphocytes Relative: 25.9 % (ref 12.0–46.0)
Lymphs Abs: 1.4 K/uL (ref 0.7–4.0)
MCHC: 33.2 g/dL (ref 30.0–36.0)
MCV: 98.3 fl (ref 78.0–100.0)
Monocytes Absolute: 0.5 K/uL (ref 0.1–1.0)
Monocytes Relative: 9.7 % (ref 3.0–12.0)
Neutro Abs: 3.1 K/uL (ref 1.4–7.7)
Neutrophils Relative %: 59.7 % (ref 43.0–77.0)
Platelets: 171 K/uL (ref 150.0–400.0)
RBC: 4.02 Mil/uL — ABNORMAL LOW (ref 4.22–5.81)
RDW: 15.3 % (ref 11.5–15.5)
WBC: 5.3 K/uL (ref 4.0–10.5)

## 2024-07-03 LAB — IBC + FERRITIN
Ferritin: 85.8 ng/mL (ref 22.0–322.0)
Iron: 65 ug/dL (ref 42–165)
Saturation Ratios: 22.6 % (ref 20.0–50.0)
TIBC: 287 ug/dL (ref 250.0–450.0)
Transferrin: 205 mg/dL — ABNORMAL LOW (ref 212.0–360.0)

## 2024-07-03 LAB — VITAMIN B12: Vitamin B-12: 369 pg/mL (ref 211–911)

## 2024-07-03 LAB — PROTIME-INR
INR: 1.6 ratio — ABNORMAL HIGH (ref 0.8–1.0)
Prothrombin Time: 16.6 s — ABNORMAL HIGH (ref 9.6–13.1)

## 2024-07-03 MED ORDER — NA SULFATE-K SULFATE-MG SULF 17.5-3.13-1.6 GM/177ML PO SOLN: 1.0000 | Freq: Once | ORAL | 0 refills | Status: AC

## 2024-07-03 NOTE — Patient Instructions (Addendum)
 Your provider has requested that you go to the basement level for lab work before leaving today. Press B on the elevator. The lab is located at the first door on the left as you exit the elevator.  Due to recent changes in healthcare laws, you may see the results of your imaging and laboratory studies on MyChart before your provider has had a chance to review them.  We understand that in some cases there may be results that are confusing or concerning to you. Not all laboratory results come back in the same time frame and the provider may be waiting for multiple results in order to interpret others.  Please give us  48 hours in order for your provider to thoroughly review all the results before contacting the office for clarification of your results.    Check on Hep B vaccinations.  You have been scheduled for an endoscopy and colonoscopy. Please follow the written instructions given to you at your visit today.  If you use inhalers (even only as needed), please bring them with you on the day of your procedure.  DO NOT TAKE 7 DAYS PRIOR TO TEST- Trulicity (dulaglutide) Ozempic, Wegovy (semaglutide) Mounjaro (tirzepatide) Bydureon Bcise (exanatide extended release)  DO NOT TAKE 1 DAY PRIOR TO YOUR TEST Rybelsus (semaglutide) Adlyxin (lixisenatide) Victoza (liraglutide) Byetta (exanatide) ___________________________________________________________________________   Diverticulosis Diverticulosis is a condition that develops when small pouches (diverticula) form in the wall of the large intestine (colon). The colon is where water is absorbed and stool (feces) is formed. The pouches form when the inside layer of the colon pushes through weak spots in the outer layers of the colon. You may have a few pouches or many of them. The pouches usually do not cause problems unless they become inflamed or infected. When this happens, the condition is called diverticulitis- this is left lower quadrant  pain, diarrhea, fever, chills, nausea or vomiting.  If this occurs please call the office or go to the hospital. Sometimes these patches without inflammation can also have painless bleeding associated with them, if this happens please call the office or go to the hospital. Preventing constipation and increasing fiber can help reduce diverticula and prevent complications. Even if you feel you have a high-fiber diet, suggest getting on Benefiber or Cirtracel 2 times daily.  You should have an imaging study every 6 months to monitor for the development of hepatocellular carcinoma (liver cancer). The risk is low, but, if liver cancer is diagnosed early, there are better treatment options. Will get AFP, INR, CBC, and CMET.  Will follow up in 6 months to check labs and evaluate.   I recommend a high-protein, primarily plant-based diet. Avoid red meat. Work to maintain a health weight. Weigh yourself daily- call if you have weight gain of greater than 5 lbs in a 1-2 days, leg swelling, or new swelling in your abdomen. Minimize salt intake- VERY important. Please do not consume more than 2000 mg of sodium every day. Monitor your blood pressure at home.  Stay active. Weight-based exercise for 30 minutes at least 3 days a week is recommended. I recommend that you not drink any alcohol including beer, wine, liquor, and non-alcoholic beer.   You are at increased risk of osteopenia and osteoporosis. You should be screened for these metabolic bone diseases if you have not already had the testing performed.  Cirrhosis Cirrhosis is long-term (chronic) liver injury. The liver is the body's largest internal organ, and it performs many functions. It converts  food into energy, removes toxic material from the blood, makes important proteins, and absorbs necessary vitamins from food. In cirrhosis, healthy liver cells are replaced by scar tissue. This prevents blood from flowing through the liver and makes it  difficult for the liver to complete its functions. What are the causes? Common causes of this condition are hepatitis C and long-term alcohol abuse. Other causes include: Nonalcoholic fatty liver disease (NAFLD). This happens when fat is deposited in the liver by causes other than alcohol. Hepatitis B infection. Autoimmune hepatitis. In this condition, the body's defense system (immune system) mistakenly attacks the liver cells, causing inflammation. Diseases that cause blockage of ducts inside the liver. Inherited liver diseases, such as hemochromatosis. This is one of the most common inherited liver diseases. In this disease, deposits of iron collect in the liver and other organs. Reactions to certain long-term medicines, such as amiodarone, a heart medicine. Parasitic infections. These include schistosomiasis, which is caused by a flatworm. Long-term contact to certain toxins. These toxins include certain organic solvents, such as toluene and chloroform. What increases the risk? You are more likely to develop this condition if: You have certain types of viral hepatitis. You abuse alcohol, especially if you are male. You are overweight. You use IV drugs and share needles. You have unprotected sex with someone who has viral hepatitis. What are the signs or symptoms? You may not have any signs and symptoms at first. Symptoms may not develop until the damage to your liver starts to get worse. Early symptoms may include: Weakness and tiredness (fatigue). Changes in sleep patterns or having trouble sleeping. Itchiness. Tenderness in the right-upper part of your abdomen. Weight loss and muscle loss. Nausea. Loss of appetite. Later symptoms may include: Fatigue or weakness that is getting worse. Yellow skin and eyes (jaundice). Buildup of fluid in the abdomen (ascites). You may notice that your clothes are tight around your waist. Weight gain and swelling of the feet and ankles  (edema). Trouble breathing. Easy bruising and bleeding. Vomiting blood, or black or bloody stool. Mental confusion. How is this diagnosed? Your health care provider may suspect cirrhosis based on your symptoms and medical history, especially if you have other medical conditions or a history of alcohol abuse. Your health care provider will do a physical exam to feel your liver and to check for signs of cirrhosis. Tests may include: Blood tests to check: For hepatitis B or C. Kidney function. Liver function. Imaging tests such as: MRI or CT scan to look for changes seen in advanced cirrhosis. Ultrasound to see if normal liver tissue is being replaced by scar tissue. A procedure in which a long needle is used to take a sample of liver tissue to be checked in a lab (biopsy). Liver biopsy can confirm the diagnosis of cirrhosis. How is this treated? Treatment for this condition depends on how damaged your liver is and what caused the damage. It may include treating the symptoms of cirrhosis, or treating the underlying causes to slow the damage. Treatment may include: Making lifestyle changes, such as: Eating a healthy diet. You may need to work with your health care provider or a dietitian to develop an eating plan. Restricting salt intake. Maintaining a healthy weight. Not abusing drugs or alcohol. Taking medicines to: Treat liver infections or other infections. Control itching. Reduce fluid buildup. Reduce certain blood toxins. Reduce risk of bleeding from enlarged blood vessels in the stomach or esophagus (varices). Liver transplant. In this procedure, a liver  from a donor is used to replace your diseased liver. This is done if cirrhosis has caused liver failure. Other treatments and procedures may be done depending on the problems that you get from cirrhosis. Common problems include liver-related kidney failure (hepatorenal syndrome). Follow these instructions at home:  Take medicines  only as told by your health care provider. Do not use medicines that are toxic to your liver. Ask your health care provider before taking any new medicines, including over-the-counter medicines such as NSAIDs. Rest as needed. Eat a well-balanced diet. Limit your salt or water intake, if your health care provider asks you to do this. Do not drink alcohol. This is especially important if you routinely take acetaminophen . Keep all follow-up visits. This is important. Contact a health care provider if you: Have fatigue or weakness that is getting worse. Develop swelling of the hands, feet, or legs, or a buildup of fluid in the abdomen (ascites). Have a fever or chills. Develop loss of appetite. Have nausea or vomiting. Develop jaundice. Develop easy bruising or bleeding. Get help right away if you: Vomit bright red blood or a material that looks like coffee grounds. Have blood in your stools. Notice that your stools appear black and tarry. Become confused. Have chest pain or trouble breathing. These symptoms may represent a serious problem that is an emergency. Do not wait to see if the symptoms will go away. Get medical help right away. Call your local emergency services (911 in the [place] Do not drive yourself to the hospital. Summary Cirrhosis is chronic liver injury. Common causes are hepatitis C and long-term alcohol abuse. Tests used to diagnose cirrhosis include blood tests, imaging tests, and liver biopsy. Treatment for this condition involves treating the underlying cause. Avoid alcohol, drugs, salt, and medicines that may damage your liver. Get help right away if you vomit bright red blood or a material that looks like coffee grounds. This information is not intended to replace advice given to you by your health care provider. Make sure you discuss any questions you have with your health care provider. Document Revised: 06/10/2020 Document Reviewed: 06/10/2020 Elsevier Patient  Education  2022 [employer].

## 2024-07-03 NOTE — Telephone Encounter (Signed)
 [facility] Pre-operative Risk Assessment     Request for surgical clearance:     Endoscopy Procedure  What type of surgery is being performed?     EGD and Colonoscopy  When is this surgery scheduled?     08/12/24  What type of clearance is required ?   Pharmacy  Are there any medications that need to be held prior to surgery and how long? Eliquis  2 days  Practice name and name of physician performing surgery?      [facility]  What is your office phone and fax number?      Phone- [phone]  Fax- [phone]  Anesthesia type (None, local, MAC, general) ?       MAC   Please route your response to [employer], CMA

## 2024-07-03 NOTE — Progress Notes (Unsigned)
 07/03/2024 [name] [id number] 1941/07/12  Referring provider: Norleen Lynwood ORN, MD Primary GI doctor: Dr. Federico  ASSESSMENT AND PLAN:  Recurrent diverticulitis.  12/2020, 12/2023 Previously seen Dr. Dante with [facility] in 08/2021 and opted against surgery  -Schedule colonoscopy with EGD with Dr. San and Dr. Lafonda absence at [facility] to evaluate further, We have discussed the risks of bleeding, infection, perforation, medication reactions, and remote risk of death associated with colonoscopy. All questions were answered and the patient acknowledges these risk and wishes to proceed. -Continue fiber supplement -Given information - Consider surgery referral although patient higher risk.  Discussed with patient and he would prefer to see if there is another episode prior to evaluation.  Precancerous colon polyps   Colonoscopy July 2016 Dr. Teressa , done for routine screening found to small polyps one was a tubular adenoma and diverticulosis.  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  Patient provides understanding and gave verbal consent to proceed.  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 difficile infection  confirmed by toxin positive diarrhea testing.  Treated with vancomycin  No symptoms at this time  A-fib 07/03/2023 echocardiogram EF 40 to 45% mild aortic valve stenosis aortic valve V-max measures 2.57M/S On Eliquis , had to wait 90 days post ablation to be able to hold (06/18/2024), should be appropriate to hold Hold Eliquis  for 2 days before procedure will instruct when and how to resume after procedure.  Patient understands that there is a low but real risk of cardiovascular event such as heart attack, stroke, or embolism /  thrombosis, or ischemia while off Eliquis   The patient consents to proceed.  Will communicate by phone or EMR with patient's prescribing provider to confirm that holding Eliquis  is reasonable in this case.   CAD status post PCI 02/26/2024 cardiac morphology unremarkable Denies chest pain or shortness of breath, patient is very active  Patient Care Team: Norleen Lynwood ORN, MD as PCP - General (Internal Medicine) O'Neal, Darryle Ned, MD as PCP - Cardiology (Internal Medicine) Kennyth Chew, MD as PCP - Electrophysiology (Cardiology) Fernande Elspeth BROCKS, MD (Inactive) (Cardiology) Sheril Coy, MD as Consulting Physician (Orthopedic Surgery) Norleen Lynwood ORN, MD as Consulting Physician (Internal Medicine) Cleotilde Sewer, OD as Consulting Physician (Optometry)  HISTORY OF PRESENT ILLNESS: 83 y.o. male with a past medical history listed below presents for evaluation of EGD/colon.   Patient last seen in the office 03/24/2024 by Dr. Federico for recurrent diverticulitis, cirrhosis and prior C. difficile, personal history of colon polyps.  Discussed the use of AI scribe software for clinical note transcription with the patient, who gave verbal consent to proceed.  History of Present Illness  [name] is an 83 year old male with  diverticulitis and cirrhosis who presents for follow-up on his gastrointestinal health.  He has a history of recurrent diverticulitis, with the first episode resulting in a 10-day hospitalization due to a severe infection. The most recent episode occurred two months ago, presenting with abdominal tenderness similar to the first episode, and was resolved with antibiotics. His last colonoscopy in 2022 revealed a hyperplastic polyp and hemorrhoids.  He was diagnosed with cirrhosis approximately six to nine months ago during routine testing. An ultrasound performed two to three weeks ago showed no progression or lesions. He attributes his cirrhosis to heavy alcohol consumption in his youth, although he has been abstinent for 25 years.  He takes iron and B12 supplements due to previously low levels. He takes a quarter of an iron tablet daily and a fraction of a B12 pill weekly. No dark black stools or blood in the stool, although he occasionally experiences minor bleeding when wiping after bowel movements.  He has a history of atrial fibrillation and has undergone three ablations. The first two ablations were followed by recurrence of symptoms, but the third ablation has been successful with no atrial fibrillation since the procedure. He is on Eliquis , initially prescribed by Dr. Fernande, who has since retired. He is particularly concerned about stroke risk due to his father's history of stroke at age 10.  He exercises three times a week at the [facility], including weight training and 30-minute treadmill walks. He follows a high-fiber diet, consuming 25 to 35 grams of fiber daily, which he tracks in a log. He denies constipation and reports no swelling in his legs or abdomen.  He recalls receiving several vaccinations recently, including COVID, flu, and TDAP, but is unsure if he received the hepatitis B vaccine.      He  reports that he has never smoked. He has been exposed to tobacco smoke. He has never used  smokeless tobacco. He reports that he does not drink alcohol and does not use drugs.  RELEVANT GI HISTORY, IMAGING AND LABS: Results   LABS Vitamin B12: Low (03/2024) Iron: Low (03/2024)  RADIOLOGY Abdominal Ultrasound: Cirrhosis with no noticeable progression, no lesions (06/12/2024)      CBC    Component Value Date/Time   WBC 5.8 03/10/2024 1127   WBC 6.3 01/27/2024 1527   RBC 4.36 03/10/2024 1127   RBC 4.17 (L) 01/27/2024 1527   HGB 14.1 03/10/2024 1127   HCT 43.8 03/10/2024 1127   PLT 140 (L) 03/10/2024 1127   MCV 101 (H) 03/10/2024 1127   MCH 32.3 03/10/2024 1127   MCH 32.4 01/27/2024 1527   MCHC 32.2 03/10/2024 1127   MCHC 31.9 01/27/2024 1527   RDW 12.8 03/10/2024 1127   LYMPHSABS 0.9 05/23/2023 1400   LYMPHSABS 1.2 12/15/2021 1533   MONOABS 0.5 05/23/2023 1400   EOSABS 0.2 05/23/2023 1400   EOSABS 0.2 12/15/2021 1533   BASOSABS 0.0 05/23/2023 1400   BASOSABS 0.0 12/15/2021 1533   Recent Labs    09/17/23 0909 01/27/24 1527 02/11/24 1358 03/10/24 1127  HGB 13.6 13.5 13.6 14.1    CMP     Component Value Date/Time   NA 142 03/10/2024 1122   K 4.7 03/10/2024 1122   CL 104 03/10/2024 1122   CO2 24 03/10/2024 1122   GLUCOSE 77 03/10/2024 1122   GLUCOSE 161 (H) 01/27/2024 1527   BUN 17 03/10/2024 1122   CREATININE 0.94 03/10/2024 1122   CREATININE 1.18  08/27/2015 1518   CALCIUM  9.9 03/10/2024 1122   PROT 7.3 03/24/2024 1501   ALBUMIN  4.2 03/24/2024 1501   AST 25 03/24/2024 1501   ALT 25 03/24/2024 1501   ALKPHOS 63 03/24/2024 1501   BILITOT 0.6 03/24/2024 1501   GFRNONAA 45 (L) 01/27/2024 1527   GFRAA 66 07/08/2019 1455      Latest Ref Rng & Units 03/24/2024    3:01 PM 05/23/2023    2:00 PM 11/10/2022    7:03 AM  Hepatic Function  Total Protein 6.0 - 8.3 g/dL 7.3  6.3  6.7   Albumin  3.5 - 5.2 g/dL 4.2  3.9  4.0   AST 0 - 37 U/L 25  32  28   ALT 0 - 53 U/L 25  26  25    Alk Phosphatase 39 - 117 U/L 63  46  38   Total Bilirubin 0.2 - 1.2 mg/dL  0.6  0.6  0.8   Bilirubin, Direct 0.0 - 0.3 mg/dL 0.2  0.2        Current Medications:    Current Outpatient Medications (Cardiovascular):    atorvastatin  (LIPITOR) 80 MG tablet, TAKE 1 TABLET BY MOUTH ONCE  DAILY   fenofibrate  160 MG tablet, TAKE 1 TABLET BY MOUTH DAILY   nebivolol  (BYSTOLIC ) 2.5 MG tablet, Take 1 tablet (2.5 mg total) by mouth daily.  Current Outpatient Medications (Respiratory):    albuterol  (PROVENTIL ) (2.5 MG/3ML) 0.083% nebulizer solution, USE 1 VIAL VIA NEBULIZER EVERY 6 HOURS AS NEEDED FOR WHEEZING OR SHORTNESS OF BREATH   albuterol  (VENTOLIN  HFA) 108 (90 Base) MCG/ACT inhaler, Inhale 2 puffs into the lungs every 6 (six) hours as needed for wheezing or shortness of breath.   fexofenadine (ALLEGRA) 180 MG tablet, Take 90 mg by mouth 2 (two) times daily.  Current Outpatient Medications (Analgesics):    acetaminophen  (TYLENOL ) 500 MG tablet, Take 500 mg by mouth every 6 (six) hours as needed (pain/headaches).  Current Outpatient Medications (Hematological):    Cyanocobalamin  (VITAMIN B 12) 500 MCG TABS, Take 125 mcg by mouth daily.   ELIQUIS  5 MG TABS tablet, TAKE 1 TABLET BY MOUTH TWICE  DAILY   folic acid (FOLVITE) 400 MCG tablet, Take 400 mcg by mouth in the morning.  Current Outpatient Medications (Other):    Coenzyme Q10 300 MG CAPS, Take 300 mg by mouth in the morning and at bedtime.   Fluocinonide  Emulsified Base 0.05 % CREA, APPLY TO AFFECTED AREA(S)  TWO TIMES DAILY AS NEEDED   Lysine  500 MG TABS, Take 500 mg by mouth 3 (three) times daily.   Magnesium  250 MG TABS, Take 125 mg by mouth 2 (two) times daily. With vitamin d    mometasone (ELOCON) 0.1 % cream, Apply topically 2 (two) times daily.  Medical History:  Past Medical History:  Diagnosis Date   Allergic rhinitis    takes Allegra daily   Allergic rhinitis, cause unspecified 06/22/2012   Allergy    Arthritis    Atrial fibrillation (HCC)    takes Tikosyn  and Eliquis  daily   Bilateral  popliteal artery aneurysm    Chronic kidney disease (CKD), stage III (moderate) (HCC)    Clotting disorder    Coronary artery disease    LAD stenting 2004 non-DES   Depression    took Zoloft 82yrs ago but nothing now   Diverticulosis    DVT (deep venous thrombosis) (HCC)    left popliteal artery greater than 10 years ago   Dysphagia    occasionally  History of blood clots 2003   left leg prior to fem pop   History of colon polyps    Hyperlipidemia    takes Tricor  and Lipitor daily   Insomnia    takes Ambien  nightly as needed   Joint pain    Joint swelling    Neuromuscular disorder (HCC)    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   OSA (obstructive sleep apnea)    CPAP   Pacemaker    Peripheral vascular disease    Pleurisy    early 80's   Prostatitis    Sleep apnea    Urinary urgency    Allergies:  Allergies  Allergen Reactions   Amiodarone Other (See Comments)    Peripheral neuropathy resulted   Niacin Other (See Comments)    Caused an irregular heartbeat   Ace Inhibitors Cough   Mometasone Furoate Other (See Comments)    (Nasonex) Causes nasal bleeding and nose bleeds     Surgical History:  He  has a past surgical history that includes Inguinal hernia repair (Right); Pacemaker insertion (09/25/1996); right and left fem-pop bypass; Hand surgery (Left); left knee surgery; Cardiac catheterization (09/11/2002); hand and arm surgery (Right); Turbinate reduction; wisdom extracted ; Colonoscopy; Total hip arthroplasty (Left, 12/09/2013); Joint replacement (Left, 12/09/2013); Joint replacement (Right, 05/05/2014); Total hip arthroplasty (Right, 05/05/2014); Permanent pacemaker generator change (N/A, 06/17/2012); Cardiac catheterization (N/A, 05/06/2015); Cardioversion (N/A, 12/18/2020); IR Radiologist Eval & Mgmt (01/05/2021); Shoulder arthroscopy with rotator cuff repair (Right, 05/31/2021); ATRIAL FIBRILLATION ABLATION (N/A, 06/30/2021); ATRIAL FIBRILLATION  ABLATION (N/A, 01/09/2022); CARDIOVERSION (N/A, 09/17/2023); ATRIAL FIBRILLATION ABLATION (N/A, 03/18/2024); A-FLUTTER ABLATION (N/A, 03/18/2024); and Ablation (2025). Family History:  His family history includes Aneurysm in his father; Diabetes in his mother and another family member; Heart attack in his father; Heart disease in his father and mother; Peripheral vascular disease in his father; Sleep apnea in an other family member; Stroke in his father.  REVIEW OF SYSTEMS  : All other systems reviewed and negative except where noted in the History of Present Illness.  PHYSICAL EXAM: BP (!) 100/52   Pulse 66   Ht 6' 3 (1.905 m)   Wt 195 lb 6 oz (88.6 kg)   BMI 24.42 kg/m  Physical Exam   GENERAL APPEARANCE: Well nourished, in no apparent distress. HEENT: No cervical lymphadenopathy, unremarkable thyroid , sclerae anicteric, conjunctiva pink. RESPIRATORY: Respiratory effort normal, breath sounds equal bilaterally without rales, rhonchi, or wheezing. CARDIO: Regular rate and rhythm with no murmurs, rubs, or gallops, peripheral pulses intact. ABDOMEN: Soft, non-distended, active bowel sounds in all four quadrants, no tenderness to palpation, no rebound, no mass appreciated. RECTAL: Declines. MUSCULOSKELETAL: Full range of motion, normal gait, without edema or swelling in legs or abdomen. SKIN: Dry, intact without rashes or lesions. No jaundice. NEURO: Alert, oriented, no focal deficits. PSYCH: Cooperative, normal mood and affect.      Alan JONELLE Coombs, PA-C 3:40 PM

## 2024-07-04 ENCOUNTER — Ambulatory Visit: Payer: Self-pay | Admitting: Physician Assistant

## 2024-07-04 ENCOUNTER — Encounter: Payer: Self-pay | Admitting: Internal Medicine

## 2024-07-04 NOTE — Progress Notes (Signed)
 Agree with the assessment and plan as outlined by Quentin Mulling, PA-C. ? ?Keron Neenan, DO, FACG ? ?

## 2024-07-13 NOTE — Telephone Encounter (Signed)
 Patient with diagnosis of atrial fibrillation on Eliquis  for anticoagulation.    What type of surgery is being performed?     EGD and Colonoscopy  When is this surgery scheduled?     08/12/24    CHA2DS2-VASc Score = 4   This indicates a 4.8% annual risk of stroke. The patient's score is based upon: CHF History: 1 HTN History: 0 Diabetes History: 0 Stroke History: 0 Vascular Disease History: 1 Age Score: 2 Gender Score: 0   Also has history of DVT 2003  CrCl 69 Platelet count 171  Patient has not had an Afib/aflutter ablation in the last 3 months, DCCV within the last 4 weeks or a watchman implanted in the last 45 days   Per office protocol, patient can hold Eliquis  for 2 days prior to procedure.   Patient will not need bridging with Lovenox (enoxaparin) around procedure.  **This guidance is not considered finalized until pre-operative APP has relayed final recommendations.**

## 2024-07-14 ENCOUNTER — Encounter: Payer: Self-pay | Admitting: Cardiology

## 2024-07-14 NOTE — Telephone Encounter (Addendum)
     Primary Cardiologist: Darryle ONEIDA Decent, MD  Clinical pharmacist have reviewed patient's chart and provided the following recommendations for, TIMMY BUBECK:  Patient has not had an Afib/aflutter ablation in the last 3 months, DCCV within the last 4 weeks or a watchman implanted in the last 45 days    Per office protocol, patient can hold Eliquis  for 2 days prior to procedure.   Patient will not need bridging with Lovenox (enoxaparin) around procedure.  I will route this recommendation to the requesting party via Epic fax function and remove from pre-op pool.  Josefa HERO. Patsey Pitstick NP-C     07/14/2024, 9:03 AM [facility] [address] [place], [place] [address] Office [phone]

## 2024-07-14 NOTE — Progress Notes (Signed)
 PERIOPERATIVE PRESCRIPTION FOR IMPLANTED CARDIAC DEVICE PROGRAMMING  Patient Information: Name:  [name]  DOB:  1940/10/31  MRN:  [id number]  Request for surgical clearance:     Endoscopy Procedure  What type of surgery is being performed?     EGD and Colonoscopy  When is this surgery scheduled?     08/12/24  What type of clearance is required ?   Device  Are there any medications that need to be held prior to surgery and how long? Eliquis  2 days  Practice name and name of physician performing surgery?      [facility]  What is your office phone and fax number?       Phone- [phone]  Fax- [phone]  Anesthesia type (None, local, MAC, general) ?       MAC  Device Information:  Clinic EP Physician:  Fonda Kitty, MD   Device Type:  Pacemaker Manufacturer and Phone #:  Biotronik: [phone] Pacemaker Dependent?:  No. Date of Last Device Check:  02/01/24 Normal Device Function?:  Yes.    Electrophysiologist's Recommendations:  Have magnet available. Provide continuous ECG monitoring when magnet is used or reprogramming is to be performed.  Procedure may interfere with device function.  Magnet should be placed over device during procedure.  Per Device Clinic Standing Orders, Prentice JINNY Silvan, RN  11:53 AM 07/14/2024

## 2024-07-16 NOTE — Telephone Encounter (Signed)
 I spoke to [name] and I advised him that his cardiologist approved his hold of Eliquis  for 2 days prior to his procedure.  I confirmed that his last day to take the Eliquis  would be on 11/29.  Patient agreed with the plan of care.

## 2024-07-29 ENCOUNTER — Encounter: Payer: Self-pay | Admitting: Gastroenterology

## 2024-08-12 ENCOUNTER — Encounter: Payer: Self-pay | Admitting: Gastroenterology

## 2024-08-12 ENCOUNTER — Ambulatory Visit: Admitting: Gastroenterology

## 2024-08-12 VITALS — BP 121/63 | HR 71 | Temp 97.4°F | Resp 13 | Ht 75.0 in | Wt 195.0 lb

## 2024-08-12 DIAGNOSIS — K5730 Diverticulosis of large intestine without perforation or abscess without bleeding: Secondary | ICD-10-CM

## 2024-08-12 DIAGNOSIS — D123 Benign neoplasm of transverse colon: Secondary | ICD-10-CM

## 2024-08-12 DIAGNOSIS — K2950 Unspecified chronic gastritis without bleeding: Secondary | ICD-10-CM

## 2024-08-12 DIAGNOSIS — D124 Benign neoplasm of descending colon: Secondary | ICD-10-CM

## 2024-08-12 DIAGNOSIS — Z1211 Encounter for screening for malignant neoplasm of colon: Secondary | ICD-10-CM

## 2024-08-12 DIAGNOSIS — Z860101 Personal history of adenomatous and serrated colon polyps: Secondary | ICD-10-CM

## 2024-08-12 DIAGNOSIS — Z860100 Personal history of colon polyps, unspecified: Secondary | ICD-10-CM

## 2024-08-12 DIAGNOSIS — K7460 Unspecified cirrhosis of liver: Secondary | ICD-10-CM

## 2024-08-12 DIAGNOSIS — K2970 Gastritis, unspecified, without bleeding: Secondary | ICD-10-CM

## 2024-08-12 DIAGNOSIS — K641 Second degree hemorrhoids: Secondary | ICD-10-CM

## 2024-08-12 MED ORDER — SODIUM CHLORIDE 0.9 % IV SOLN: 500.0000 mL | Freq: Once | INTRAVENOUS | Status: DC

## 2024-08-12 NOTE — Progress Notes (Signed)
 Called to room to assist during endoscopic procedure.  Patient ID and intended procedure confirmed with present staff. Received instructions for my participation in the procedure from the performing physician.

## 2024-08-12 NOTE — Progress Notes (Signed)
 GASTROENTEROLOGY PROCEDURE H&P NOTE   Primary Care Physician: Norleen Lynwood ORN, MD    Reason for Procedure:  History of colon polyps, recurrent diverticulitis, cirrhosis, esophageal varices screening  Plan:    EGD, colonoscopy  Patient is appropriate for endoscopic procedure(s) in the ambulatory ([facility]) setting.  The nature of the procedure, as well as the risks, benefits, and alternatives were carefully and thoroughly reviewed with the patient. Ample time for discussion and questions allowed. The patient understood, was satisfied, and agreed to proceed. I personally addressed all patient questions and concerns.     HPI: [name] is a 83 y.o. male who presents for EGD for esophageal varices screening, along with colonoscopy for ongoing polyp surveillance.  Does have a history of recurrent diverticulitis and was previously evaluated in the surgical clinic and patient opted against surgery.  Last episode was 12/2023.  History of A-fib.  Holding Eliquis  for procedure today.  Colonoscopy July 2016 Dr. Teressa , done for routine screening found to small polyps one was a tubular adenoma and diverticulosis.  Colonoscopy 07/2021 with 3 mm transverse colon polyp (hyperplastic polyp) that was removed and left sided colon diverticulosis, internal/external hemorrhoids  Past Medical History:  Diagnosis Date   Allergic rhinitis    takes Allegra daily   Allergic rhinitis, cause unspecified 06/22/2012   Allergy    Arthritis    Atrial fibrillation (HCC)    takes Tikosyn  and Eliquis  daily   Bilateral popliteal artery aneurysm    Chronic kidney disease (CKD), stage III (moderate) (HCC)    Clotting disorder    Coronary artery disease    LAD stenting 2004 non-DES   Depression    took Zoloft 27yrs ago but nothing now   Diverticulosis    DVT (deep venous thrombosis) (HCC)    left popliteal artery greater than 10 years ago   Dysphagia    occasionally   History of blood clots 2003   left  leg prior to fem pop   History of colon polyps    Hyperlipidemia    takes Tricor  and Lipitor daily   Insomnia    takes Ambien  nightly as needed   Joint pain    Joint swelling    Neuromuscular disorder (HCC)    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   OSA (obstructive sleep apnea)    CPAP   Pacemaker    Peripheral vascular disease    Pleurisy    early 80's   Prostatitis    Sleep apnea    Urinary urgency     Past Surgical History:  Procedure Laterality Date   A-FLUTTER ABLATION N/A 03/18/2024   Procedure: A-FLUTTER ABLATION;  Surgeon: Kennyth Chew, MD;  Location: [facility] INVASIVE CV LAB;  Service: Cardiovascular;  Laterality: N/A;   ABLATION  2025   ATRIAL FIBRILLATION ABLATION N/A 06/30/2021   Procedure: ATRIAL FIBRILLATION ABLATION;  Surgeon: Kelsie Lynwood, MD;  Location: [facility] INVASIVE CV LAB;  Service: Cardiovascular;  Laterality: N/A;   ATRIAL FIBRILLATION ABLATION N/A 01/09/2022   Procedure: ATRIAL FIBRILLATION ABLATION;  Surgeon: Kelsie Lynwood, MD;  Location: [facility] INVASIVE CV LAB;  Service: Cardiovascular;  Laterality: N/A;   ATRIAL FIBRILLATION ABLATION N/A 03/18/2024   Procedure: ATRIAL FIBRILLATION ABLATION;  Surgeon: Kennyth Chew, MD;  Location: [facility] INVASIVE CV LAB;  Service: Cardiovascular;  Laterality: N/A;   CARDIAC CATHETERIZATION  09/11/2002   with 2 stents   CARDIAC CATHETERIZATION N/A 05/06/2015   Procedure: Left Heart Cath and Coronary Angiography;  Surgeon: Victory LELON Sharps, MD;  Location: [facility] INVASIVE CV LAB;  Service: Cardiovascular;  Laterality: N/A;   CARDIOVERSION N/A 12/18/2020   Procedure: CARDIOVERSION;  Surgeon: Delford Maude BROCKS, MD;  Location: [facility] OR;  Service: Cardiovascular;  Laterality: N/A;   CARDIOVERSION N/A 09/17/2023   Procedure: CARDIOVERSION;  Surgeon: Loni Soyla LABOR, MD;  Location: [facility] INVASIVE CV LAB;  Service: Cardiovascular;  Laterality: N/A;   COLONOSCOPY     hand and arm surgery Right    as a teenager   HAND SURGERY Left     INGUINAL HERNIA REPAIR Right    IR RADIOLOGIST EVAL & MGMT  01/05/2021   JOINT REPLACEMENT Left 12/09/2013   Left Hip    JOINT REPLACEMENT Right 05/05/2014   Right Hip   left knee surgery     PACEMAKER INSERTION  09/25/1996   due to bradycardia   PERMANENT PACEMAKER GENERATOR CHANGE N/A 06/17/2012   Procedure: PERMANENT PACEMAKER GENERATOR CHANGE;  Surgeon: Elspeth BROCKS Sage, MD;  Location: [facility] CATH LAB;  Service: Cardiovascular;  Laterality: N/A;   right and left fem-pop bypass     SHOULDER ARTHROSCOPY WITH ROTATOR CUFF REPAIR Right 05/31/2021   Procedure: RIGHT SHOULDER ARTHROSCOPY ACROMIOPLASTY AND DEBRIDEMENT;  Surgeon: Sheril Maude, MD;  Location: [facility] ORS;  Service: Orthopedics;  Laterality: Right;   TOTAL HIP ARTHROPLASTY Left 12/09/2013   Procedure: TOTAL HIP ARTHROPLASTY ANTERIOR APPROACH;  Surgeon: Maude KANDICE Sheril, MD;  Location: [facility] OR;  Service: Orthopedics;  Laterality: Left;  left anterior total hip arthroplasty   TOTAL HIP ARTHROPLASTY Right 05/05/2014   Procedure: TOTAL HIP ARTHROPLASTY ANTERIOR APPROACH;  Surgeon: Maude KANDICE Sheril, MD;  Location: [facility] OR;  Service: Orthopedics;  Laterality: Right;   TURBINATE REDUCTION     wisdom extracted       Prior to Admission medications   Medication Sig Start Date End Date Taking? Authorizing Provider  acetaminophen  (TYLENOL ) 500 MG tablet Take 500 mg by mouth every 6 (six) hours as needed (pain/headaches).   Yes [provider]  albuterol  (PROVENTIL ) (2.5 MG/3ML) 0.083% nebulizer solution USE 1 VIAL VIA NEBULIZER EVERY 6 HOURS AS NEEDED FOR WHEEZING OR SHORTNESS OF BREATH 09/03/23  Yes Kara Dorn NOVAK, MD  albuterol  (VENTOLIN  HFA) 108 (90 Base) MCG/ACT inhaler Inhale 2 puffs into the lungs every 6 (six) hours as needed for wheezing or shortness of breath. 04/03/23  Yes Kara Dorn NOVAK, MD  atorvastatin  (LIPITOR) 80 MG tablet TAKE 1 TABLET BY MOUTH ONCE  DAILY 04/07/24  Yes Sage Elspeth BROCKS, MD  Coenzyme Q10 300 MG CAPS Take  300 mg by mouth in the morning and at bedtime.   Yes [provider]  Cyanocobalamin  (VITAMIN B 12) 500 MCG TABS Take 125 mcg by mouth daily.   Yes [provider]  fenofibrate  160 MG tablet TAKE 1 TABLET BY MOUTH DAILY 04/29/24  Yes Sage Elspeth BROCKS, MD  fexofenadine (ALLEGRA) 180 MG tablet Take 90 mg by mouth 2 (two) times daily.   Yes [provider]  folic acid (FOLVITE) 400 MCG tablet Take 400 mcg by mouth in the morning.   Yes [provider]  Lysine  500 MG TABS Take 500 mg by mouth 3 (three) times daily.   Yes [provider]  Magnesium  250 MG TABS Take 125 mg by mouth 2 (two) times daily. With vitamin d    Yes [provider]  ELIQUIS  5 MG TABS tablet TAKE 1 TABLET BY MOUTH TWICE  DAILY 04/29/24   Sage Elspeth BROCKS,  MD  Fluocinonide  Emulsified Base 0.05 % CREA APPLY TO AFFECTED AREA(S)  TWO TIMES DAILY AS NEEDED 01/16/19   Norleen Lynwood ORN, MD  mometasone (ELOCON) 0.1 % cream Apply topically 2 (two) times daily. 05/19/24   [provider]  nebivolol  (BYSTOLIC ) 2.5 MG tablet Take 1 tablet (2.5 mg total) by mouth daily. 07/01/24   Aniceto Daphne CROME, NP    Current Outpatient Medications  Medication Sig Dispense Refill   acetaminophen  (TYLENOL ) 500 MG tablet Take 500 mg by mouth every 6 (six) hours as needed (pain/headaches).     albuterol  (PROVENTIL ) (2.5 MG/3ML) 0.083% nebulizer solution USE 1 VIAL VIA NEBULIZER EVERY 6 HOURS AS NEEDED FOR WHEEZING OR SHORTNESS OF BREATH 120 mL 5   albuterol  (VENTOLIN  HFA) 108 (90 Base) MCG/ACT inhaler Inhale 2 puffs into the lungs every 6 (six) hours as needed for wheezing or shortness of breath. 6.7 g 6   atorvastatin  (LIPITOR) 80 MG tablet TAKE 1 TABLET BY MOUTH ONCE  DAILY 100 tablet 2   Coenzyme Q10 300 MG CAPS Take 300 mg by mouth in the morning and at bedtime.     Cyanocobalamin  (VITAMIN B 12) 500 MCG TABS Take 125 mcg by mouth daily.     fenofibrate  160 MG tablet TAKE 1 TABLET BY MOUTH DAILY 100  tablet 1   fexofenadine (ALLEGRA) 180 MG tablet Take 90 mg by mouth 2 (two) times daily.     folic acid (FOLVITE) 400 MCG tablet Take 400 mcg by mouth in the morning.     Lysine  500 MG TABS Take 500 mg by mouth 3 (three) times daily.     Magnesium  250 MG TABS Take 125 mg by mouth 2 (two) times daily. With vitamin d      ELIQUIS  5 MG TABS tablet TAKE 1 TABLET BY MOUTH TWICE  DAILY 200 tablet 2   Fluocinonide  Emulsified Base 0.05 % CREA APPLY TO AFFECTED AREA(S)  TWO TIMES DAILY AS NEEDED 60 g 2   mometasone (ELOCON) 0.1 % cream Apply topically 2 (two) times daily.     nebivolol  (BYSTOLIC ) 2.5 MG tablet Take 1 tablet (2.5 mg total) by mouth daily. 90 tablet 3   Current Facility-Administered Medications  Medication Dose Route Frequency Provider Last Rate Last Admin   0.9 %  sodium chloride  infusion  500 mL Intravenous Once Chisum Habenicht V, DO        Allergies as of 08/12/2024 - Review Complete 08/12/2024  Allergen Reaction Noted   Amiodarone Other (See Comments) 01/07/2008   Niacin Other (See Comments)    Ace inhibitors Cough 01/07/2008   Mometasone furoate Other (See Comments) 02/21/2011    Family History  Problem Relation Age of Onset   Diabetes Mother    Heart disease Mother        Before age 12   Heart disease Father        Before age 55   Heart attack Father    Stroke Father    Aneurysm Father        bilat pop art aneurysms   Peripheral vascular disease Father        Popliteal Aneurysm   Diabetes Other        family hx   Sleep apnea Other        family hx   Allergic rhinitis Neg Hx    Angioedema Neg Hx    Asthma Neg Hx    Eczema Neg Hx    Immunodeficiency Neg Hx    Urticaria  Neg Hx    Colon cancer Neg Hx    Esophageal cancer Neg Hx    Prostate cancer Neg Hx    Rectal cancer Neg Hx    Stomach cancer Neg Hx     Social History   Socioeconomic History   Marital status: Married    Spouse name: Lethia Caldron   Number of children: Not on file   Years of education:  Not on file   Highest education level: Not on file  Occupational History   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       Lives with wife.  Son and family is with them for now.  [place] is living there while n college   Social Drivers of Health   Financial Resource Strain: Medium Risk (10/17/2023)   Overall Financial Resource Strain (CARDIA)    Difficulty of Paying Living Expenses: Somewhat hard  Food Insecurity: No Food Insecurity (10/17/2023)   Hunger Vital Sign    Worried About Running Out of Food in the Last Year: Never true    Ran Out of Food in the Last Year: Never true  Transportation Needs: No Transportation Needs (10/17/2023)   PRAPARE - Administrator, Civil Service (Medical): No    Lack of Transportation (Non-Medical): No  Physical Activity: Insufficiently Active (10/17/2023)   Exercise Vital Sign    Days of Exercise per Week: 1 day    Minutes of Exercise per Session: 90 min  Stress: Stress Concern Present (10/17/2023)   [employer] of Occupational Health - Occupational Stress Questionnaire    Feeling of Stress : Rather much  Social Connections: Moderately Isolated (10/17/2023)   Social Connection and Isolation Panel    Frequency of Communication with Friends and Family: Not on file    Frequency of Social Gatherings with Friends and Family: More than three times a week    Attends Religious Services: Never    Database Administrator or Organizations: No    Attends Banker Meetings:  Never    Marital Status: Married  Catering Manager Violence: Not At Risk (10/17/2023)   Humiliation, Afraid, Rape, and Kick questionnaire    Fear of Current or Ex-Partner: No    Emotionally Abused: No    Physically Abused: No    Sexually Abused: No    Physical Exam: Vital signs in last 24 hours: @BP  136/74   Pulse 64   Temp (!) 97.4 F (36.3 C)   Ht 6' 3 (1.905 m)   Wt 195 lb (88.5 kg)   SpO2 100%   BMI 24.37 kg/m  GEN: NAD EYE: Sclerae anicteric ENT: MMM CV: Non-tachycardic Pulm: CTA b/l GI: Soft, NT/ND NEURO:  Alert & Oriented x 3   Sandor Flatter, DO  Gastroenterology   08/12/2024 2:39 PM

## 2024-08-12 NOTE — Progress Notes (Signed)
 Pt's states no medical or surgical changes since previsit or office visit.

## 2024-08-12 NOTE — Op Note (Signed)
 [facility] Patient Name: [name] Procedure Date: 08/12/2024 2:28 PM MRN: [id number] Endoscopist: Sandor Flatter , MD, [id number] Age: 83 Referring MD:  Date of Birth: [birth year] Gender: Male Account #: [id number] Procedure:                Colonoscopy Indications:              Follow-up of diverticulitis                           History of diverticulitis with initial episode in                            2022 requiring prolonged hospitalization with IV                            antibiotics, drain placement. Most recent episode                            was 12/2023, managed as outpatient. Presents today                            for endoscopic follow-up. He is otherwise without                            lower GI symptoms.                           Separately, does have a history of colon polyps. Medicines:                Monitored Anesthesia Care Procedure:                Pre-Anesthesia Assessment:                           - Prior to the procedure, a History and Physical                            was performed, and patient medications and                            allergies were reviewed. The patient's tolerance of                            previous anesthesia was also reviewed. The risks                            and benefits of the procedure and the sedation                            options and risks were discussed with the patient.                            All questions were answered, and informed consent  was obtained. Prior Anticoagulants: The patient has                            taken no anticoagulant or antiplatelet agents. ASA                            Grade Assessment: III - A patient with severe                            systemic disease. After reviewing the risks and                            benefits, the patient was deemed in satisfactory                            condition to undergo the procedure.                            After obtaining informed consent, the colonoscope                            was passed under direct vision. Throughout the                            procedure, the patient's blood pressure, pulse, and                            oxygen saturations were monitored continuously. The                            Olympus Scope SN: [id number] was introduced through                            the anus and advanced to the the terminal ileum.                            The colonoscopy was performed without difficulty.                            The patient tolerated the procedure well. The                            quality of the bowel preparation was good. The                            terminal ileum, ileocecal valve, appendiceal                            orifice, and rectum were photographed. Scope In: 3:02:28 PM Scope Out: 3:17:49 PM Scope Withdrawal Time: 0 hours 11 minutes 46 seconds  Total Procedure Duration: 0 hours 15 minutes 21 seconds  Findings:                 The perianal and digital rectal examinations were  normal.                           Two sessile polyps were found in the descending                            colon and transverse colon. The polyps were 3 to 5                            mm in size. These polyps were removed with a cold                            snare. Resection and retrieval were complete.                            Estimated blood loss was minimal.                           Multiple large-mouthed and small-mouthed                            diverticula were found in the sigmoid colon,                            descending colon, transverse colon, ascending colon                            and cecum. Diverticular disease was most dense in                            the sigmoid colon. There was otherwise no                            surrounding mucosal erythema or luminal                            stricture/narrowing noted  on this study.                           Non-bleeding internal hemorrhoids were found during                            retroflexion. The hemorrhoids were medium-sized and                            Grade II (internal hemorrhoids that prolapse but                            reduce spontaneously).                           The terminal ileum appeared normal. Complications:            No immediate complications. Estimated Blood Loss:     Estimated blood loss was minimal. Impression:               -  Two 3 to 5 mm polyps in the descending colon and                            in the transverse colon, removed with a cold snare.                            Resected and retrieved.                           - Diverticulosis in the sigmoid colon, in the                            descending colon, in the transverse colon, in the                            ascending colon and in the cecum.                           - Non-bleeding internal hemorrhoids.                           - The examined portion of the ileum was normal. Recommendation:           - Patient has a contact number available for                            emergencies. The signs and symptoms of potential                            delayed complications were discussed with the                            patient. Return to normal activities tomorrow.                            Written discharge instructions were provided to the                            patient.                           - Resume previous diet.                           - Continue present medications.                           - Await pathology results.                           - Repeat colonoscopy is not recommended due to                            current age (19 years or older) for screening  purposes.                           - Return to GI office PRN. Sandor Flatter, MD 08/12/2024 3:28:24 PM

## 2024-08-12 NOTE — Progress Notes (Signed)
 To pacu, VSS. Report to RN.tb

## 2024-08-12 NOTE — Patient Instructions (Addendum)
 - Resume previous diet. - Continue present medications. - 2 polyps removed and sent to pathology - Await pathology results. - diverticulosis and internal hemorrhoids - Repeat colonoscopy is not recommended due to     current age (31 years or older) for screening     purposes. - Return to GI office PRN.  YOU HAD AN ENDOSCOPIC PROCEDURE TODAY AT THE [facility]:   Refer to the procedure report that was given to you for any specific questions about what was found during the examination.  If the procedure report does not answer your questions, please call your gastroenterologist to clarify.  If you requested that your care partner not be given the details of your procedure findings, then the procedure report has been included in a sealed envelope for you to review at your convenience later.  YOU SHOULD EXPECT: Some feelings of bloating in the abdomen. Passage of more gas than usual.  Walking can help get rid of the air that was put into your GI tract during the procedure and reduce the bloating. If you had a lower endoscopy (such as a colonoscopy or flexible sigmoidoscopy) you may notice spotting of blood in your stool or on the toilet paper. If you underwent a bowel prep for your procedure, you may not have a normal bowel movement for a few days.  Please Note:  You might notice some irritation and congestion in your nose or some drainage.  This is from the oxygen used during your procedure.  There is no need for concern and it should clear up in a day or so.  SYMPTOMS TO REPORT IMMEDIATELY:  Following lower endoscopy (colonoscopy or flexible sigmoidoscopy):  Excessive amounts of blood in the stool  Significant tenderness or worsening of abdominal pains  Swelling of the abdomen that is new, acute  Fever of 100F or higher  Following upper endoscopy (EGD)  Vomiting of blood or coffee ground material  New chest pain or pain under the shoulder blades  Painful or persistently  difficult swallowing  New shortness of breath  Fever of 100F or higher  Black, tarry-looking stools  For urgent or emergent issues, a gastroenterologist can be reached at any hour by calling [phone]. Do not use MyChart messaging for urgent concerns.    DIET:  We do recommend a small meal at first, but then you may proceed to your regular diet.  Drink plenty of fluids but you should avoid alcoholic beverages for 24 hours.  ACTIVITY:  You should plan to take it easy for the rest of today and you should NOT DRIVE or use heavy machinery until tomorrow (because of the sedation medicines used during the test).    FOLLOW UP: Our staff will call the number listed on your records the next business day following your procedure.  We will call around 7:15- 8:00 am to check on you and address any questions or concerns that you may have regarding the information given to you following your procedure. If we do not reach you, we will leave a message.     If any biopsies were taken you will be contacted by phone or by letter within the next 1-3 weeks.  Please call us  at [phone] if you have not heard about the biopsies in 3 weeks.    SIGNATURES/CONFIDENTIALITY: You and/or your care partner have signed paperwork which will be entered into your electronic medical record.  These signatures attest to the fact that that the information above on  your After Visit Summary has been reviewed and is understood.  Full responsibility of the confidentiality of this discharge information lies with you and/or your care-partner.

## 2024-08-12 NOTE — Op Note (Signed)
 [facility] Patient Name: [name] Procedure Date: 08/12/2024 2:42 PM MRN: [id number] Endoscopist: Sandor Flatter , MD, [id number] Age: 83 Referring MD:  Date of Birth: 13-Dec-1940 Gender: Male Account #: [id number] Procedure:                Upper GI endoscopy Indications:              Cirrhosis rule out esophageal varices Medicines:                Monitored Anesthesia Care Procedure:                Pre-Anesthesia Assessment:                           - Prior to the procedure, a History and Physical                            was performed, and patient medications and                            allergies were reviewed. The patient's tolerance of                            previous anesthesia was also reviewed. The risks                            and benefits of the procedure and the sedation                            options and risks were discussed with the patient.                            All questions were answered, and informed consent                            was obtained. Prior Anticoagulants: The patient has                            taken no anticoagulant or antiplatelet agents. ASA                            Grade Assessment: III - A patient with severe                            systemic disease. After reviewing the risks and                            benefits, the patient was deemed in satisfactory                            condition to undergo the procedure.                           After obtaining informed consent, the endoscope was  passed under direct vision. Throughout the                            procedure, the patient's blood pressure, pulse, and                            oxygen saturations were monitored continuously. The                            Olympus Scope SN [id number] was introduced through the                            mouth, and advanced to the second part of duodenum.                            The upper  GI endoscopy was accomplished without                            difficulty. The patient tolerated the procedure                            well. Scope In: Scope Out: Findings:                 The examined esophagus was normal.                           Patchy mild inflammation characterized by                            congestion (edema) and erythema was found in the                            gastric fundus, in the gastric body and in the                            gastric antrum. Biopsies were taken with a cold                            forceps for Helicobacter pylori testing. Estimated                            blood loss was minimal.                           The examined duodenum was normal. Complications:            No immediate complications. Estimated Blood Loss:     Estimated blood loss was minimal. Impression:               - Normal esophagus.                           - Gastritis. Biopsied.                           - Normal  examined duodenum. Recommendation:           - Patient has a contact number available for                            emergencies. The signs and symptoms of potential                            delayed complications were discussed with the                            patient. Return to normal activities tomorrow.                            Written discharge instructions were provided to the                            patient.                           - Resume previous diet.                           - Continue present medications.                           - Await pathology results. Sandor Flatter, MD 08/12/2024 3:22:43 PM

## 2024-08-13 ENCOUNTER — Telehealth: Payer: Self-pay | Admitting: *Deleted

## 2024-08-13 NOTE — Telephone Encounter (Signed)
 No answer for post procedure call back. Left VM.

## 2024-08-15 LAB — SURGICAL PATHOLOGY

## 2024-08-19 ENCOUNTER — Ambulatory Visit: Payer: Self-pay | Admitting: Gastroenterology

## 2024-10-17 ENCOUNTER — Ambulatory Visit: Payer: Medicare Other

## 2024-10-17 VITALS — BP 100/64 | HR 65 | Ht 75.75 in | Wt 192.2 lb

## 2024-10-17 DIAGNOSIS — Z0000 Encounter for general adult medical examination without abnormal findings: Secondary | ICD-10-CM

## 2024-10-17 NOTE — Patient Instructions (Signed)
 [name],  Thank you for taking the time for your Medicare Wellness Visit. I appreciate your continued commitment to your health goals. Please review the care plan we discussed, and feel free to reach out if I can assist you further.  Please note that Annual Wellness Visits do not include a physical exam. Some assessments may be limited, especially if the visit was conducted virtually. If needed, we may recommend an in-person follow-up with your provider.  Ongoing Care Seeing your primary care provider every 3 to 6 months helps us  monitor your health and provide consistent, personalized care. Remember to call office to get scheduled to be seen with your new provider.  Keep up the good work.  Referrals If a referral was made during today's visit and you haven't received any updates within two weeks, please contact the referred provider directly to check on the status.  Recommended Screenings:  Health Maintenance  Topic Date Due   Medicare Annual Wellness Visit  10/16/2024   COVID-19 Vaccine (8 - 2025-26 season) 12/16/2024   DTaP/Tdap/Td vaccine (3 - Td or Tdap) 06/25/2030   Pneumococcal Vaccine for age over 46  Completed   Flu Shot  Completed   Zoster (Shingles) Vaccine  Completed   Meningitis B Vaccine  Aged Out   Colon Cancer Screening  Discontinued       10/16/2024   11:52 AM  Advanced Directives  Does Patient Have a Medical Advance Directive? Yes  Type of Estate Agent of [place];Living will;Out of facility DNR (pink MOST or yellow form)  Copy of Healthcare Power of Attorney in Chart? No - copy requested    Vision: Annual vision screenings are recommended for early detection of glaucoma, cataracts, and diabetic retinopathy. These exams can also reveal signs of chronic conditions such as diabetes and high blood pressure.  Dental: Annual dental screenings help detect early signs of oral cancer, gum disease, and other conditions linked to overall health,  including heart disease and diabetes.  Please see the attached documents for additional preventive care recommendations.

## 2024-10-17 NOTE — Progress Notes (Signed)
 "  Chief Complaint  Patient presents with   Medicare Wellness     Subjective:   [name] is a 84 y.o. male who presents for a Medicare Annual Wellness Visit.  Visit info / Clinical Intake: Medicare Wellness Visit Type:: Subsequent Annual Wellness Visit Persons participating in visit and providing information:: patient Medicare Wellness Visit Mode:: In-person (required for WTM) Interpreter Needed?: No Pre-visit prep was completed: yes AWV questionnaire completed by patient prior to visit?: yes Date:: 10/16/24 Living arrangements:: lives with spouse/significant other; with family/others Patient's Overall Health Status Rating: (Patient-Rptd) good Typical amount of pain: (Patient-Rptd) some Does pain affect daily life?: (Patient-Rptd) no Are you currently prescribed opioids?: no  Dietary Habits and Nutritional Risks How many meals a day?: (Patient-Rptd) 3 Eats fruit and vegetables daily?: (Patient-Rptd) yes Most meals are obtained by: (Patient-Rptd) preparing own meals; eating out; having others provide food In the last 2 weeks, have you had any of the following?: none Diabetic:: no  Functional Status Activities of Daily Living (to include ambulation/medication): (Patient-Rptd) Independent Ambulation: Independent with device- listed below Home Assistive Devices/Equipment: Eyeglasses Medication Administration: (Patient-Rptd) Independent Home Management (perform basic housework or laundry): (Patient-Rptd) Independent Manage your own finances?: (Patient-Rptd) yes Primary transportation is: (Patient-Rptd) driving Concerns about vision?: (Patient-Rptd) no *vision screening is required for WTM* Concerns about hearing?: (Patient-Rptd) no  Fall Screening Falls in the past year?: 1 (Taking out the garbage at night) Number of falls in past year: (Patient-Rptd) 1 Was there an injury with Fall?: 0 (sore rt hip) Fall Risk Category Calculator: 2 Patient Fall Risk Level: Moderate  Fall Risk  Fall Risk Patient at Risk for Falls Due to: Impaired balance/gait Fall risk Follow up: Falls evaluation completed; Falls prevention discussed  Home and Transportation Safety: All rugs have non-skid backing?: (!) (Patient-Rptd) no All stairs or steps have railings?: (Patient-Rptd) yes Grab bars in the bathtub or shower?: (!) (Patient-Rptd) no Have non-skid surface in bathtub or shower?: (!) (Patient-Rptd) no Good home lighting?: (Patient-Rptd) yes Regular seat belt use?: (Patient-Rptd) yes Hospital stays in the last year:: (!) (Patient-Rptd) yes How many hospital stays:: (Patient-Rptd) 1  Cognitive Assessment Difficulty concentrating, remembering, or making decisions? : (Patient-Rptd) no Will 6CIT or Mini Cog be Completed: no 6CIT or Mini Cog Declined: patient alert, oriented, able to answer questions appropriately and recall recent events  Advance Directives (For Healthcare) Does Patient Have a Medical Advance Directive?: Yes Does patient want to make changes to medical advance directive?: No - Patient declined Type of Advance Directive: Healthcare Power of [place]; Living will; Out of facility DNR (pink MOST or yellow form) Copy of Healthcare Power of Attorney in Chart?: No - copy requested Copy of Living Will in Chart?: No - copy requested Out of facility DNR (pink MOST or yellow form) in Chart? (Ambulatory ONLY): No - copy requested Would patient like information on creating a medical advance directive?: No - Patient declined  Reviewed/Updated  Reviewed/Updated: Reviewed All (Medical, Surgical, Family, Medications, Allergies, Care Teams, Patient Goals)    Allergies (verified) Amiodarone, Niacin, Ace inhibitors, and Mometasone furoate   Current Medications (verified) Outpatient Encounter Medications as of 10/17/2024  Medication Sig   acetaminophen  (TYLENOL ) 500 MG tablet Take 500 mg by mouth every 6 (six) hours as needed (pain/headaches).   albuterol  (PROVENTIL )  (2.5 MG/3ML) 0.083% nebulizer solution USE 1 VIAL VIA NEBULIZER EVERY 6 HOURS AS NEEDED FOR WHEEZING OR SHORTNESS OF BREATH   albuterol  (VENTOLIN  HFA) 108 (90 Base) MCG/ACT inhaler Inhale 2 puffs  into the lungs every 6 (six) hours as needed for wheezing or shortness of breath.   atorvastatin  (LIPITOR) 80 MG tablet TAKE 1 TABLET BY MOUTH ONCE  DAILY   Coenzyme Q10 300 MG CAPS Take 300 mg by mouth in the morning and at bedtime.   Cyanocobalamin  (VITAMIN B 12) 500 MCG TABS Take 125 mcg by mouth daily.   ELIQUIS  5 MG TABS tablet TAKE 1 TABLET BY MOUTH TWICE  DAILY   fenofibrate  160 MG tablet TAKE 1 TABLET BY MOUTH DAILY   fexofenadine (ALLEGRA) 180 MG tablet Take 90 mg by mouth 2 (two) times daily.   Fluocinonide  Emulsified Base 0.05 % CREA APPLY TO AFFECTED AREA(S)  TWO TIMES DAILY AS NEEDED   folic acid (FOLVITE) 400 MCG tablet Take 400 mcg by mouth in the morning.   Lysine  500 MG TABS Take 500 mg by mouth 3 (three) times daily.   Magnesium  250 MG TABS Take 125 mg by mouth 2 (two) times daily. With vitamin d    mometasone (ELOCON) 0.1 % cream Apply topically 2 (two) times daily.   nebivolol  (BYSTOLIC ) 2.5 MG tablet Take 1 tablet (2.5 mg total) by mouth daily. (Patient not taking: Reported on 10/17/2024)   No facility-administered encounter medications on file as of 10/17/2024.    History: Past Medical History:  Diagnosis Date   Allergic rhinitis    takes Allegra daily   Allergic rhinitis, cause unspecified 06/22/2012   Allergy    Arthritis    Atrial fibrillation (HCC)    takes Tikosyn  and Eliquis  daily   Bilateral popliteal artery aneurysm    Chronic kidney disease (CKD), stage III (moderate) (HCC)    Clotting disorder    Coronary artery disease    LAD stenting 2004 non-DES   Depression    took Zoloft 4yrs ago but nothing now   Diverticulosis    DVT (deep venous thrombosis) (HCC)    left popliteal artery greater than 10 years ago   Dysphagia    occasionally   History of blood clots  2003   left leg prior to fem pop   History of colon polyps    Hyperlipidemia    takes Tricor  and Lipitor daily   Insomnia    takes Ambien  nightly as needed   Joint pain    Joint swelling    Neuromuscular disorder (HCC)    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   OSA (obstructive sleep apnea)    CPAP   Pacemaker    Peripheral vascular disease    Pleurisy    early 80's   Prostatitis    Sleep apnea    Urinary urgency    Past Surgical History:  Procedure Laterality Date   A-FLUTTER ABLATION N/A 03/18/2024   Procedure: A-FLUTTER ABLATION;  Surgeon: Kennyth Chew, MD;  [facility] INVASIVE CV LAB;  Service: Cardiovascular;  Laterality: N/A;   ABLATION  2025   ATRIAL FIBRILLATION ABLATION N/A 06/30/2021   Procedure: ATRIAL FIBRILLATION ABLATION;  Surgeon: Kelsie Agent, MD;  Location: [facility] INVASIVE CV LAB;  Service: Cardiovascular;  Laterality: N/A;   ATRIAL FIBRILLATION ABLATION N/A 01/09/2022   Procedure: ATRIAL FIBRILLATION ABLATION;  Surgeon: Kelsie Agent, MD;  Location: [facility] INVASIVE CV LAB;  Service: Cardiovascular;  Laterality: N/A;   ATRIAL FIBRILLATION ABLATION N/A 03/18/2024   Procedure: ATRIAL FIBRILLATION ABLATION;  Surgeon: Kennyth Chew, MD;  Location: [facility] INVASIVE CV LAB;  Service: Cardiovascular;  Laterality: N/A;   CARDIAC CATHETERIZATION  09/11/2002  with 2 stents   CARDIAC CATHETERIZATION N/A 05/06/2015   Procedure: Left Heart Cath and Coronary Angiography;  Surgeon: Victory LELON Sharps, MD;  Location: [facility] INVASIVE CV LAB;  Service: Cardiovascular;  Laterality: N/A;   CARDIOVERSION N/A 12/18/2020   Procedure: CARDIOVERSION;  Surgeon: Delford Maude BROCKS, MD;  Location: [facility] OR;  Service: Cardiovascular;  Laterality: N/A;   CARDIOVERSION N/A 09/17/2023   Procedure: CARDIOVERSION;  Surgeon: Loni Soyla LABOR, MD;  Location: [facility] INVASIVE CV LAB;  Service: Cardiovascular;  Laterality: N/A;   COLONOSCOPY     hand and arm surgery Right    as a teenager   HAND  SURGERY Left    INGUINAL HERNIA REPAIR Right    IR RADIOLOGIST EVAL & MGMT  01/05/2021   JOINT REPLACEMENT Left 12/09/2013   Left Hip    JOINT REPLACEMENT Right 05/05/2014   Right Hip   left knee surgery     PACEMAKER INSERTION  09/25/1996   due to bradycardia   PERMANENT PACEMAKER GENERATOR CHANGE N/A 06/17/2012   Procedure: PERMANENT PACEMAKER GENERATOR CHANGE;  Surgeon: Elspeth BROCKS Sage, MD;  Location: [facility] CATH LAB;  Service: Cardiovascular;  Laterality: N/A;   right and left fem-pop bypass     SHOULDER ARTHROSCOPY WITH ROTATOR CUFF REPAIR Right 05/31/2021   Procedure: RIGHT SHOULDER ARTHROSCOPY ACROMIOPLASTY AND DEBRIDEMENT;  Surgeon: Sheril Maude, MD;  Location: [facility] ORS;  Service: Orthopedics;  Laterality: Right;   TOTAL HIP ARTHROPLASTY Left 12/09/2013   Procedure: TOTAL HIP ARTHROPLASTY ANTERIOR APPROACH;  Surgeon: Maude KANDICE Sheril, MD;  Location: [facility] OR;  Service: Orthopedics;  Laterality: Left;  left anterior total hip arthroplasty   TOTAL HIP ARTHROPLASTY Right 05/05/2014   Procedure: TOTAL HIP ARTHROPLASTY ANTERIOR APPROACH;  Surgeon: Maude KANDICE Sheril, MD;  Location: [facility] OR;  Service: Orthopedics;  Laterality: Right;   TURBINATE REDUCTION     wisdom extracted      Family History  Problem Relation Age of Onset   Diabetes Mother    Heart disease Mother        Before age 2   Heart disease Father        Before age 33   Heart attack Father    Stroke Father    Aneurysm Father        bilat pop art aneurysms   Peripheral vascular disease Father        Popliteal Aneurysm   Diabetes Other        family hx   Sleep apnea Other        family hx   Allergic rhinitis Neg Hx    Angioedema Neg Hx    Asthma Neg Hx    Eczema Neg Hx    Immunodeficiency Neg Hx    Urticaria Neg Hx    Colon cancer Neg Hx    Esophageal cancer Neg Hx    Prostate cancer Neg Hx    Rectal cancer Neg Hx    Stomach cancer Neg Hx    Social History   Occupational History   Occupation: RETIRED from/Home  [employer]   Occupation: Estate manager/land agent  Tobacco Use   Smoking status: Never    Passive exposure: Past   Smokeless tobacco: Never   Tobacco comments:    Never smoke 02/07/22  Vaping Use   Vaping status: Never Used  Substance and Sexual Activity   Alcohol use: No    Alcohol/week: 0.0 standard drinks of alcohol    Comment: nothing since 2000   Drug use:  No   Sexual activity: Yes   Tobacco Counseling Counseling given: Not Answered Tobacco comments: Never smoke 02/07/22  SDOH Screenings   Food Insecurity: No Food Insecurity (10/16/2024)  Housing: Low Risk (10/16/2024)  Transportation Needs: No Transportation Needs (10/16/2024)  Utilities: Not At Risk (10/17/2024)  Alcohol Screen: Low Risk (10/17/2023)  Depression (PHQ2-9): Low Risk (10/17/2024)  Financial Resource Strain: Medium Risk (10/16/2024)  Physical Activity: Sufficiently Active (10/16/2024)  Social Connections: Moderately Isolated (10/16/2024)  Stress: No Stress Concern Present (10/16/2024)  Tobacco Use: Low Risk (10/17/2024)  Health Literacy: Adequate Health Literacy (10/17/2024)   See flowsheets for full screening details  Depression Screen PHQ 2 & 9 Depression Scale- Over the past 2 weeks, how often have you been bothered by any of the following problems? Little interest or pleasure in doing things: 0 Feeling down, depressed, or hopeless (PHQ Adolescent also includes...irritable): 0 PHQ-2 Total Score: 0 Trouble falling or staying asleep, or sleeping too much: 0 Feeling tired or having little energy: 0 Poor appetite or overeating (PHQ Adolescent also includes...weight loss): 0 Feeling bad about yourself - or that you are a failure or have let yourself or your family down: 0 Trouble concentrating on things, such as reading the newspaper or watching television (PHQ Adolescent also includes...like school work): 0 Moving or speaking so slowly that other people could have noticed. Or the opposite - being so  fidgety or restless that you have been moving around a lot more than usual: 0 Thoughts that you would be better off dead, or of hurting yourself in some way: 0 PHQ-9 Total Score: 0 If you checked off any problems, how difficult have these problems made it for you to do your work, take care of things at home, or get along with other people?: Not difficult at all  Depression Treatment Depression Interventions/Treatment : EYV7-0 Score <4 Follow-up Not Indicated     Goals Addressed   None          Objective:    Today's Vitals   10/17/24 1359  BP: 100/64  Pulse: 65  SpO2: 99%  Weight: 192 lb 3.2 oz (87.2 kg)  Height: 6' 3.75 (1.924 m)   Body mass index is 23.55 kg/m.  Hearing/Vision screen Hearing Screening - Comments:: Denies hearing difficulties   Vision Screening - Comments:: Wears eyeglasses/not UTD/Dr. Cleotilde Immunizations and Health Maintenance Health Maintenance  Topic Date Due   COVID-19 Vaccine (8 - 2025-26 season) 12/16/2024   Medicare Annual Wellness (AWV)  10/17/2025   DTaP/Tdap/Td (3 - Td or Tdap) 06/25/2030   Pneumococcal Vaccine: 50+ Years  Completed   Influenza Vaccine  Completed   Zoster Vaccines- Shingrix  Completed   Meningococcal B Vaccine  Aged Out   Colonoscopy  Discontinued        Assessment/Plan:  This is a routine wellness examination for [name].  Patient Care Team: Norleen Lynwood ORN, MD as PCP - General (Internal Medicine) O'Neal, Darryle Ned, MD as PCP - Cardiology (Internal Medicine) Kennyth Chew, MD as PCP - Electrophysiology (Cardiology) Fernande Elspeth BROCKS, MD (Inactive) (Cardiology) Sheril Coy, MD as Consulting Physician (Orthopedic Surgery) Norleen Lynwood ORN, MD as Consulting Physician (Internal Medicine) Cleotilde Sewer, OD as Consulting Physician (Optometry)  I have personally reviewed and noted the following in the patients chart:   Medical and social history Use of alcohol, tobacco or illicit drugs  Current medications and  supplements including opioid prescriptions. Functional ability and status Nutritional status Physical activity Advanced directives List of other physicians Hospitalizations, surgeries,  and ER visits in previous 12 months Vitals Screenings to include cognitive, depression, and falls Referrals and appointments  No orders of the defined types were placed in this encounter.  In addition, I have reviewed and discussed with patient certain preventive protocols, quality metrics, and best practice recommendations. A written personalized care plan for preventive services as well as general preventive health recommendations were provided to patient.   Akacia Boltz L Shatika Grinnell, CMA   10/17/2024   Return in 1 year (on 10/17/2025).  After Visit Summary: (MyChart) Due to this being a telephonic visit, the after visit summary with patients personalized plan was offered to patient via MyChart   Nurse Notes: No voiced or noted concerns at this time "
